# Patient Record
Sex: Female | Born: 1970 | Race: White | Hispanic: No | Marital: Married | Smoking: Never smoker
Health system: Southern US, Community
[De-identification: ages and names within clinical notes are randomized; demographics above are authoritative.]

## PROBLEM LIST (undated history)

## (undated) DIAGNOSIS — I639 Cerebral infarction, unspecified: Secondary | ICD-10-CM

## (undated) DIAGNOSIS — G5600 Carpal tunnel syndrome, unspecified upper limb: Secondary | ICD-10-CM

## (undated) DIAGNOSIS — J45909 Unspecified asthma, uncomplicated: Secondary | ICD-10-CM

## (undated) DIAGNOSIS — K219 Gastro-esophageal reflux disease without esophagitis: Secondary | ICD-10-CM

## (undated) DIAGNOSIS — M1990 Unspecified osteoarthritis, unspecified site: Secondary | ICD-10-CM

## (undated) DIAGNOSIS — Z87442 Personal history of urinary calculi: Secondary | ICD-10-CM

## (undated) DIAGNOSIS — M797 Fibromyalgia: Secondary | ICD-10-CM

## (undated) DIAGNOSIS — F419 Anxiety disorder, unspecified: Secondary | ICD-10-CM

## (undated) DIAGNOSIS — M549 Dorsalgia, unspecified: Secondary | ICD-10-CM

## (undated) DIAGNOSIS — H409 Unspecified glaucoma: Secondary | ICD-10-CM

## (undated) DIAGNOSIS — C50919 Malignant neoplasm of unspecified site of unspecified female breast: Secondary | ICD-10-CM

## (undated) DIAGNOSIS — F32A Depression, unspecified: Secondary | ICD-10-CM

## (undated) DIAGNOSIS — I10 Essential (primary) hypertension: Secondary | ICD-10-CM

## (undated) DIAGNOSIS — E119 Type 2 diabetes mellitus without complications: Secondary | ICD-10-CM

## (undated) DIAGNOSIS — K5090 Crohn's disease, unspecified, without complications: Secondary | ICD-10-CM

## (undated) DIAGNOSIS — R519 Headache, unspecified: Secondary | ICD-10-CM

## (undated) HISTORY — PX: LYMPH NODE BIOPSY: SHX201

## (undated) HISTORY — PX: SMALL INTESTINE SURGERY: SHX150

## (undated) HISTORY — PX: FOOT SURGERY: SHX648

## (undated) HISTORY — PX: BREAST EXCISIONAL BIOPSY: SUR124

## (undated) HISTORY — PX: SHOULDER SURGERY: SHX246

## (undated) HISTORY — PX: BREAST SURGERY: SHX581

---

## 1999-05-15 DIAGNOSIS — C50919 Malignant neoplasm of unspecified site of unspecified female breast: Secondary | ICD-10-CM

## 1999-05-15 HISTORY — DX: Malignant neoplasm of unspecified site of unspecified female breast: C50.919

## 2003-11-16 ENCOUNTER — Other Ambulatory Visit: Payer: Self-pay

## 2003-11-29 ENCOUNTER — Other Ambulatory Visit: Payer: Self-pay

## 2004-02-22 ENCOUNTER — Emergency Department: Payer: Self-pay | Admitting: General Practice

## 2004-07-19 ENCOUNTER — Ambulatory Visit: Payer: Self-pay

## 2004-10-05 ENCOUNTER — Emergency Department: Payer: Self-pay | Admitting: Emergency Medicine

## 2004-10-28 ENCOUNTER — Emergency Department: Payer: Self-pay | Admitting: General Practice

## 2004-11-03 ENCOUNTER — Emergency Department: Payer: Self-pay | Admitting: Emergency Medicine

## 2004-11-27 ENCOUNTER — Emergency Department: Payer: Self-pay | Admitting: General Practice

## 2004-12-04 ENCOUNTER — Emergency Department: Payer: Self-pay | Admitting: Emergency Medicine

## 2005-01-10 ENCOUNTER — Emergency Department: Payer: Self-pay | Admitting: Emergency Medicine

## 2005-03-26 ENCOUNTER — Emergency Department: Payer: Self-pay | Admitting: Emergency Medicine

## 2005-03-30 IMAGING — US ULTRASOUND RIGHT BREAST
1 series · 17 of 20 positions shown · non-contrast
Comparison: none

REASON FOR EXAM: swollen area rt arm
COMMENTS:

[Series 1: ultrasound right breast · 17 of 20 slices shown]
[im 1/20]
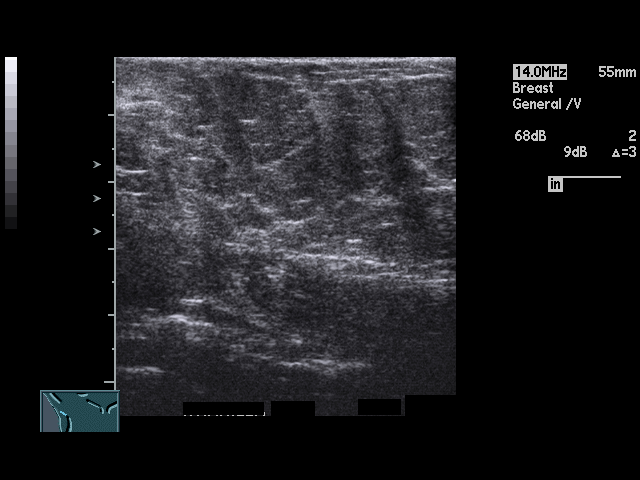
[im 2/20]
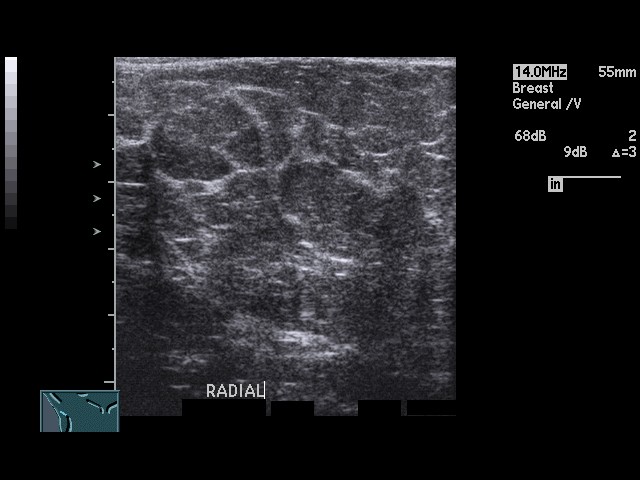
[im 3/20]
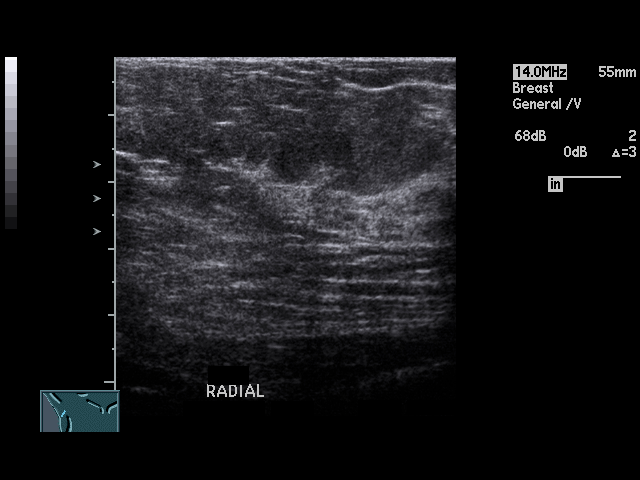
[im 5/20]
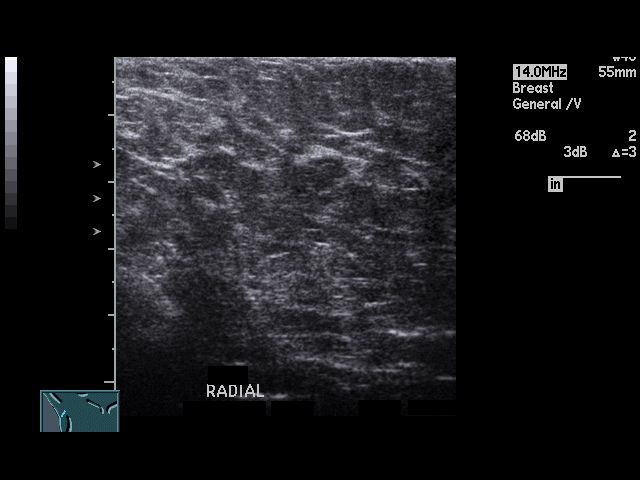
[im 6/20]
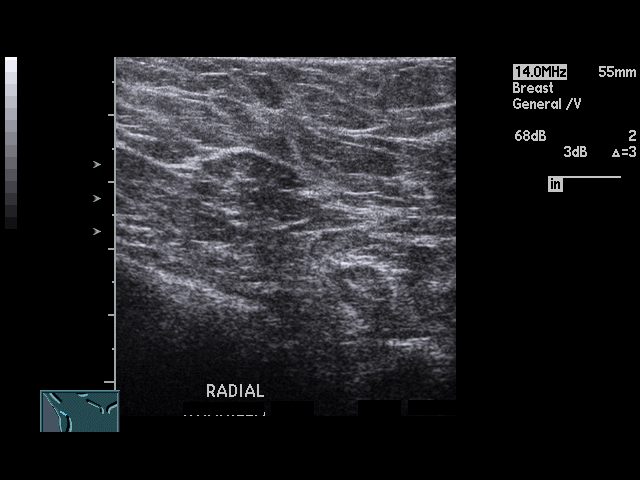
[im 7/20]
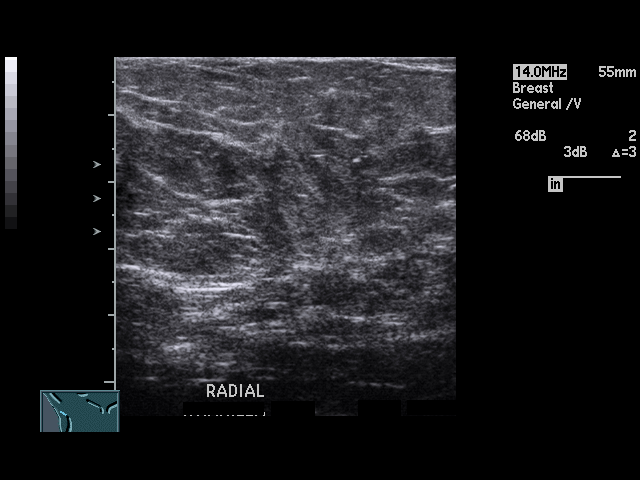
[im 8/20]
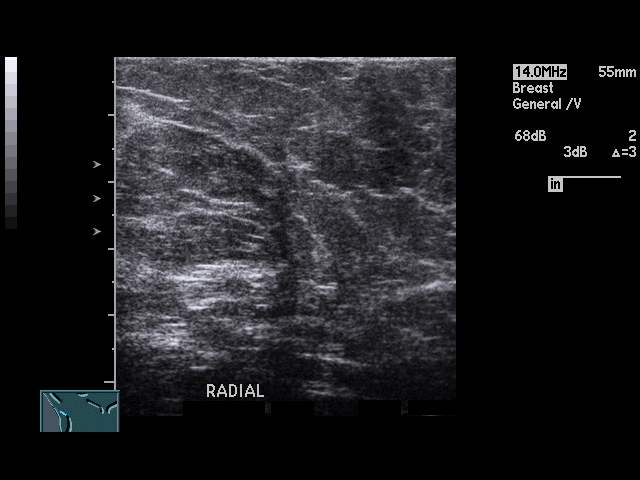
[im 9/20]
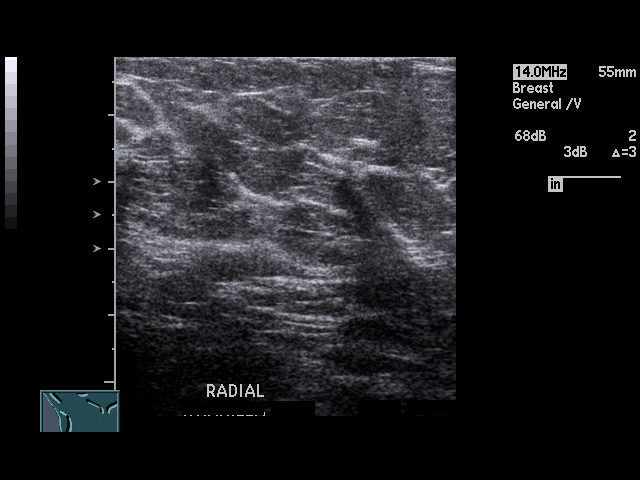
[im 11/20]
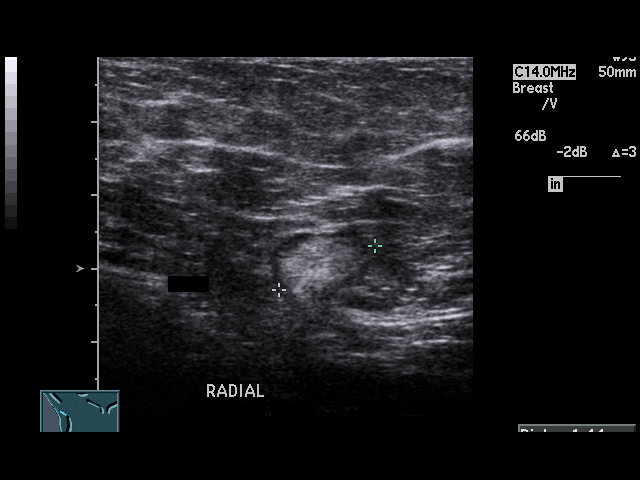
[im 12/20]
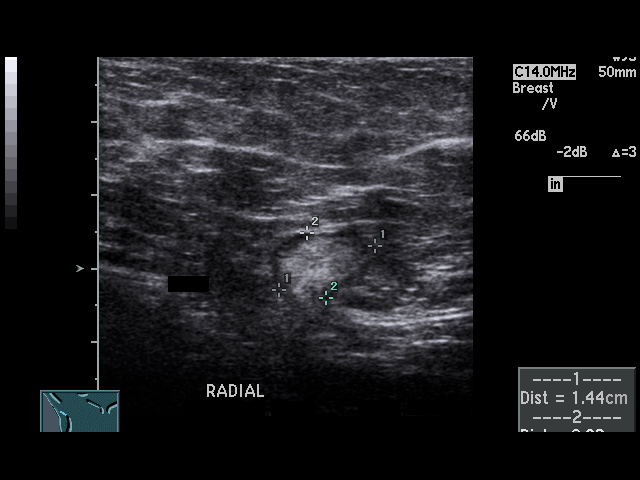
[im 13/20]
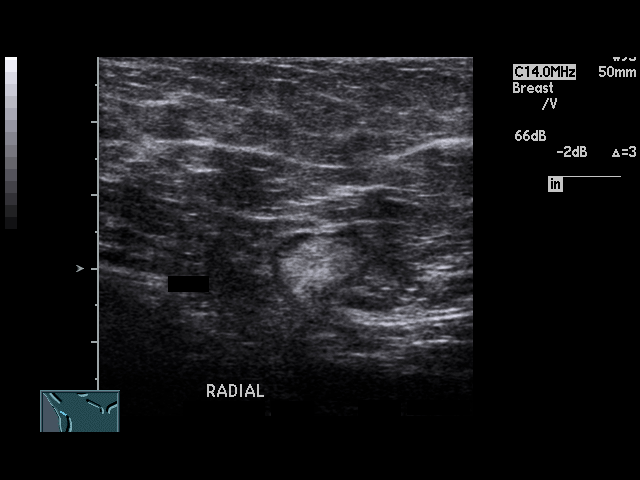
[im 14/20]
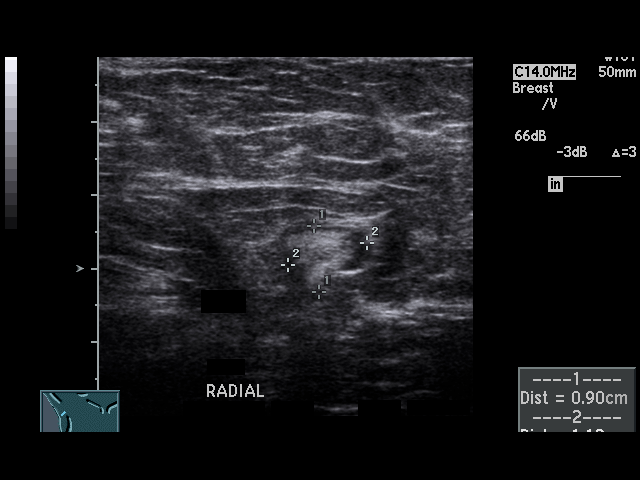
[im 15/20]
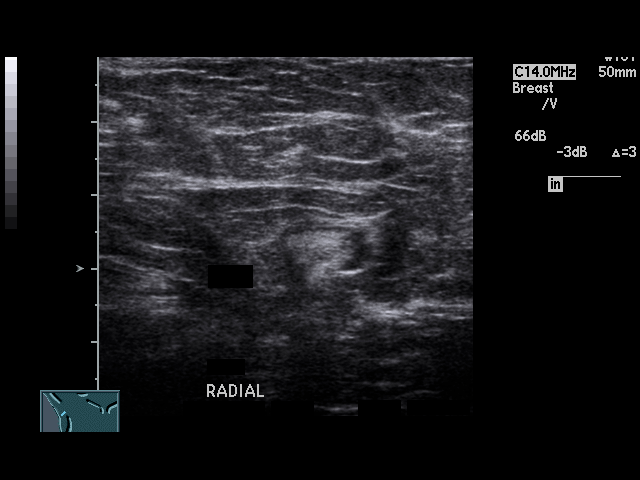
[im 16/20]
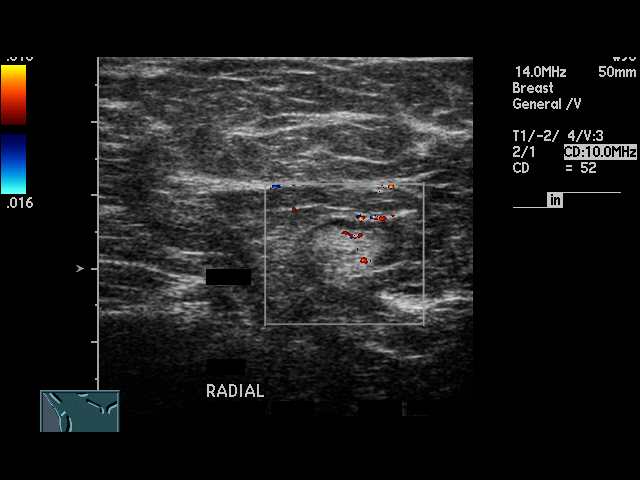
[im 18/20]
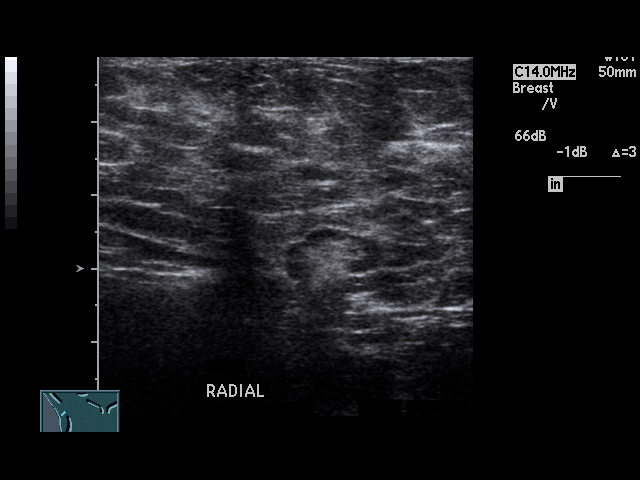
[im 19/20]
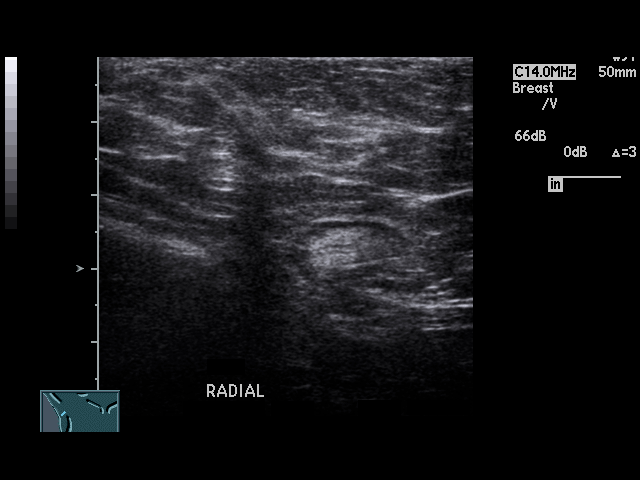
[im 20/20]
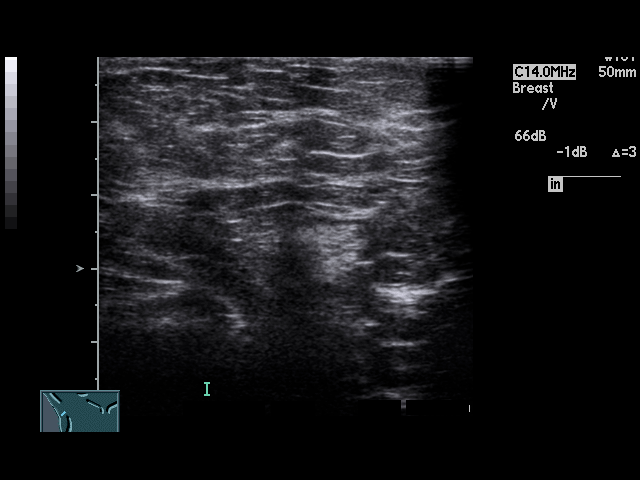

[17 of 20 positions shown; findings below may reference images not displayed]

PROCEDURE:     US  - US BREAST RIGHT  - RIGHT - July 19, 2004  [DATE]

RESULT:        The patient has noted an area of tenderness in the upper
outer quadrant of the RIGHT breast.  Ultrasound is performed over this
region and shows what appears to be a smoothly  marginated area of
peripheral hypoattenuation and a central area of hyperattenuation suggestive
of fat within the hilum of a hypoechoic lymph node.  No definite malignant
characteristics are seen.  No cysts or abscesses are identified.
IMPRESSION: Probable RIGHT upper outer quadrant axillary lymph node.

## 2005-05-04 ENCOUNTER — Emergency Department: Payer: Self-pay | Admitting: Emergency Medicine

## 2005-06-20 ENCOUNTER — Emergency Department: Payer: Self-pay | Admitting: Unknown Physician Specialty

## 2005-07-10 IMAGING — CR PELVIS - 1-2 VIEW
1 series · 1 of 1 positions shown · non-contrast
Comparison: none

REASON FOR EXAM: Fall
COMMENTS:  LMP: > one month ago

[view not recorded]
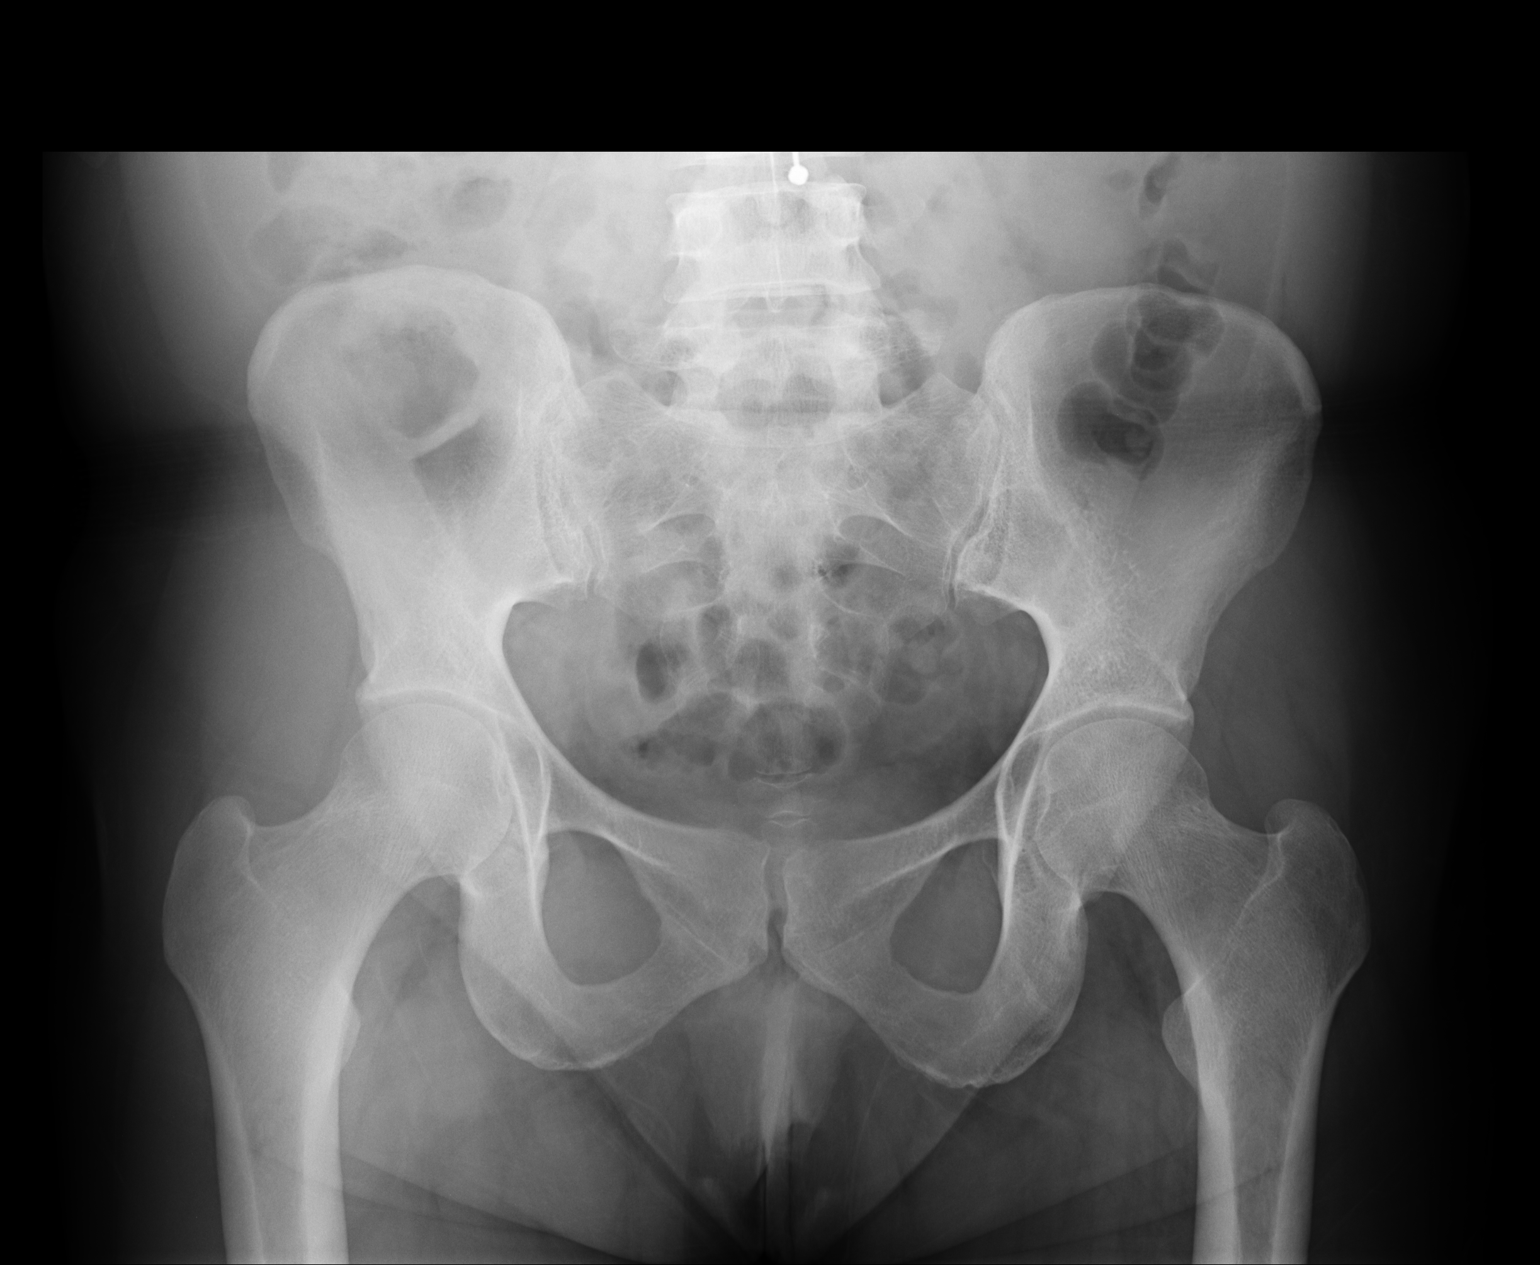

[1 of 1 positions shown; findings below may reference images not displayed]

PROCEDURE:     DXR - DXR PELVIS AP ONLY  - October 29, 2004 [DATE]

RESULT:     Single view of the pelvis is re-submitted given that the
original dictation was apparently lost in the dictation system.

The study shows an umbilical piercing seen in the midline at the L3-4 level.
The bowel gas pattern appears to be grossly normal. The overlying soft
tissues are unremarkable. The bony structures appear to be intact.
IMPRESSION: No evidence of pelvic fracture seen.
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
## 2005-07-17 ENCOUNTER — Emergency Department: Payer: Self-pay | Admitting: Emergency Medicine

## 2005-08-08 IMAGING — CR DG HAND COMPLETE 3+V*L*
1 series · 3 of 3 positions shown · non-contrast
Comparison: none

REASON FOR EXAM: Fall
COMMENTS:

PROCEDURE:     DXR - DXR HAND LT COMPLETE  W/OBLIQUES  - November 27, 2004  [DATE]
RESULT:     Three views of the LEFT hand reveal no acute fractures or
dislocations. The joint spaces are intact.

[Series 1: view not recorded · 0.17mm/px · 3 of 3 slices shown]
[im 1/3]
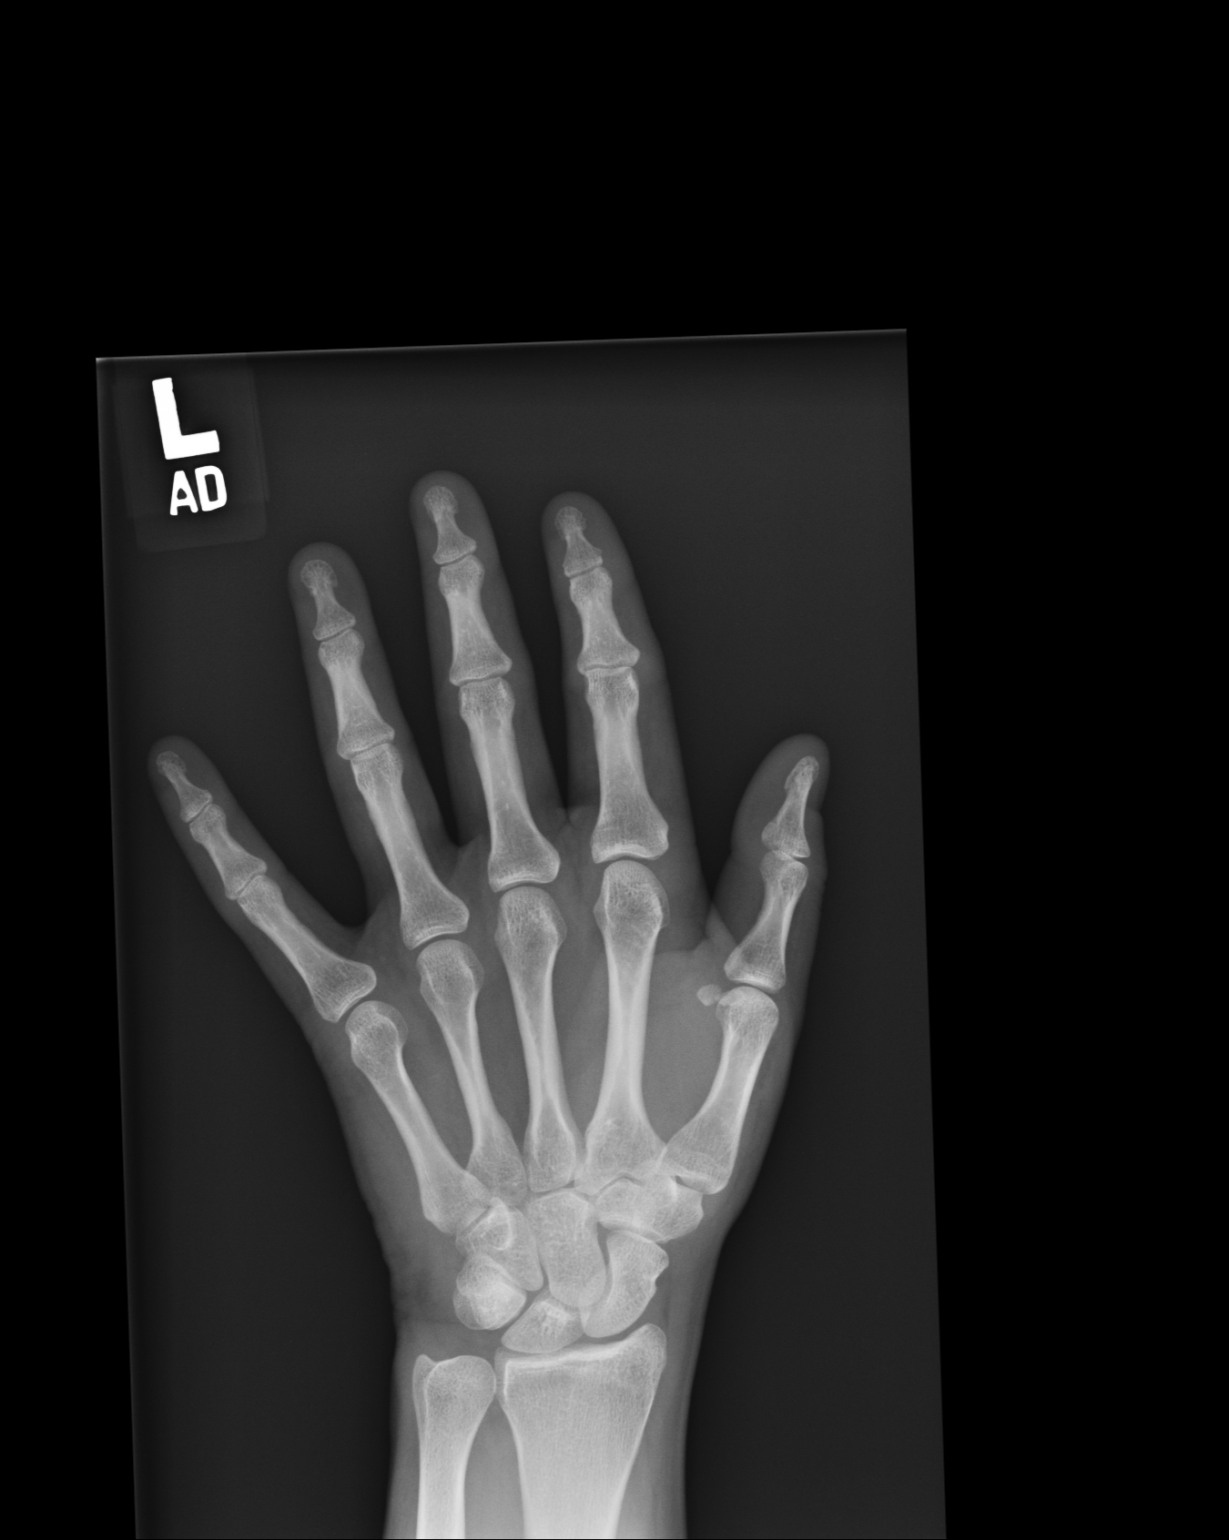
[im 2/3]
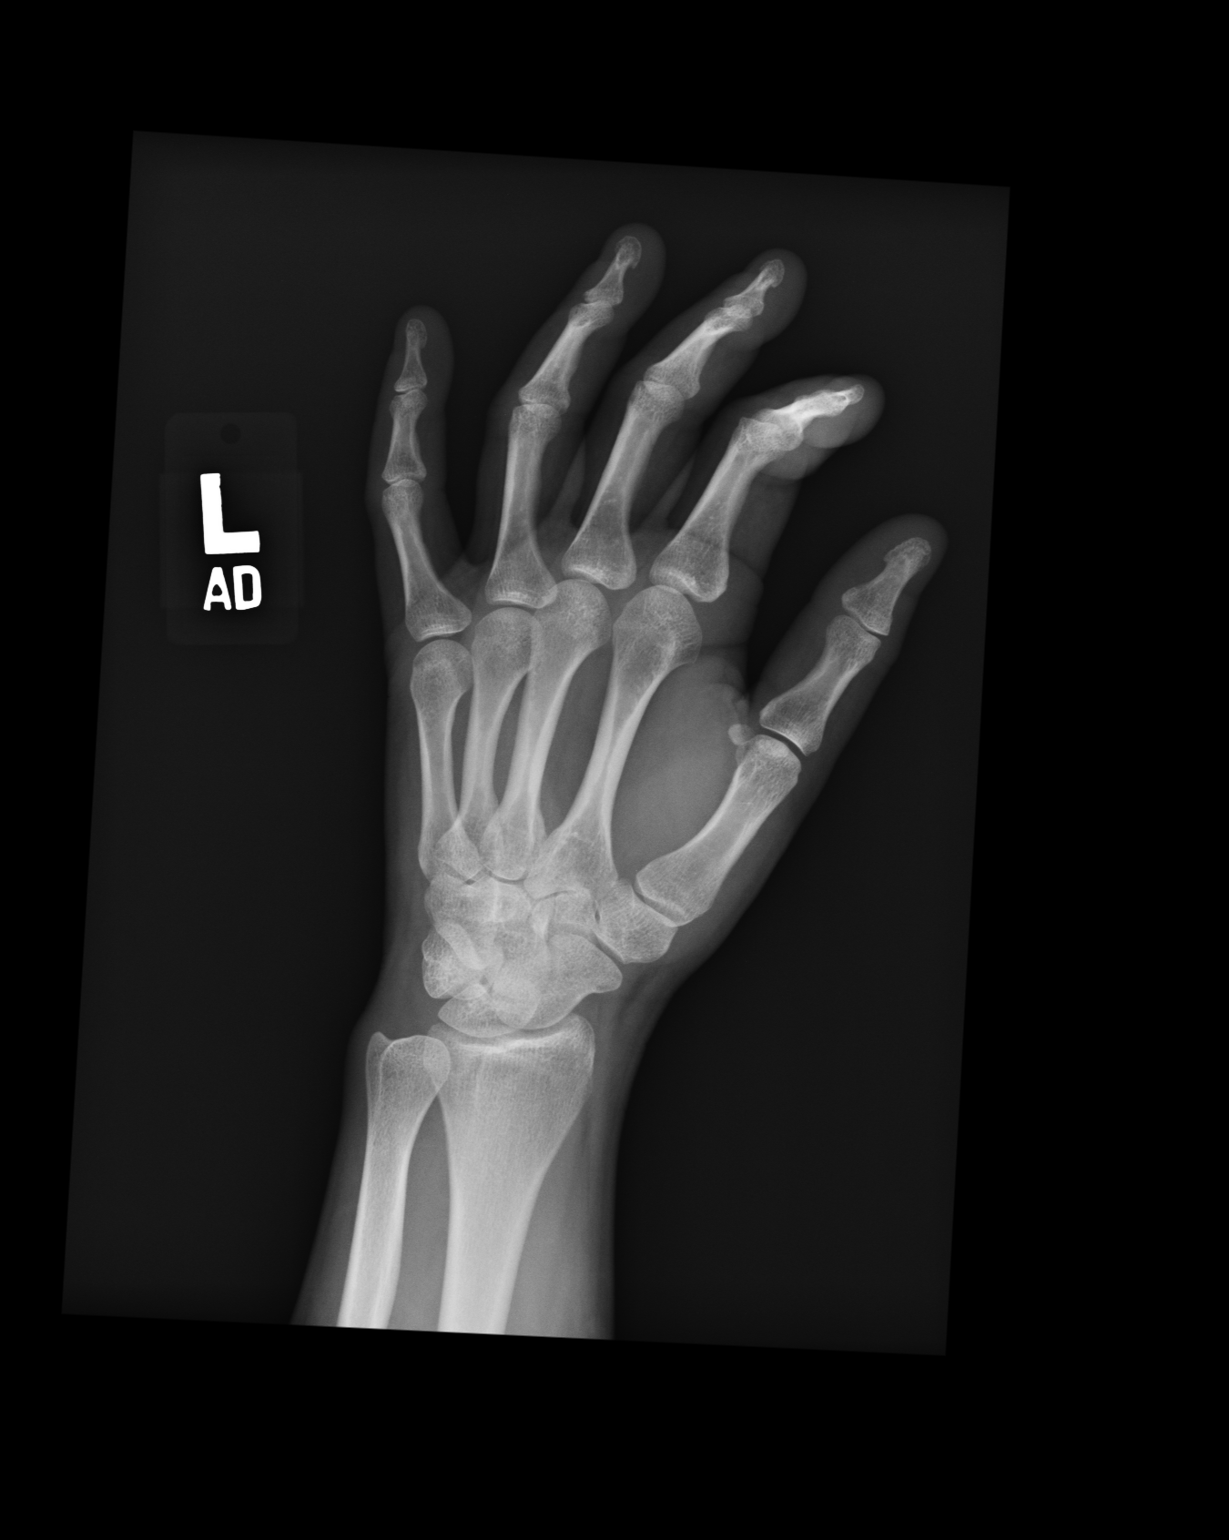
[im 3/3]
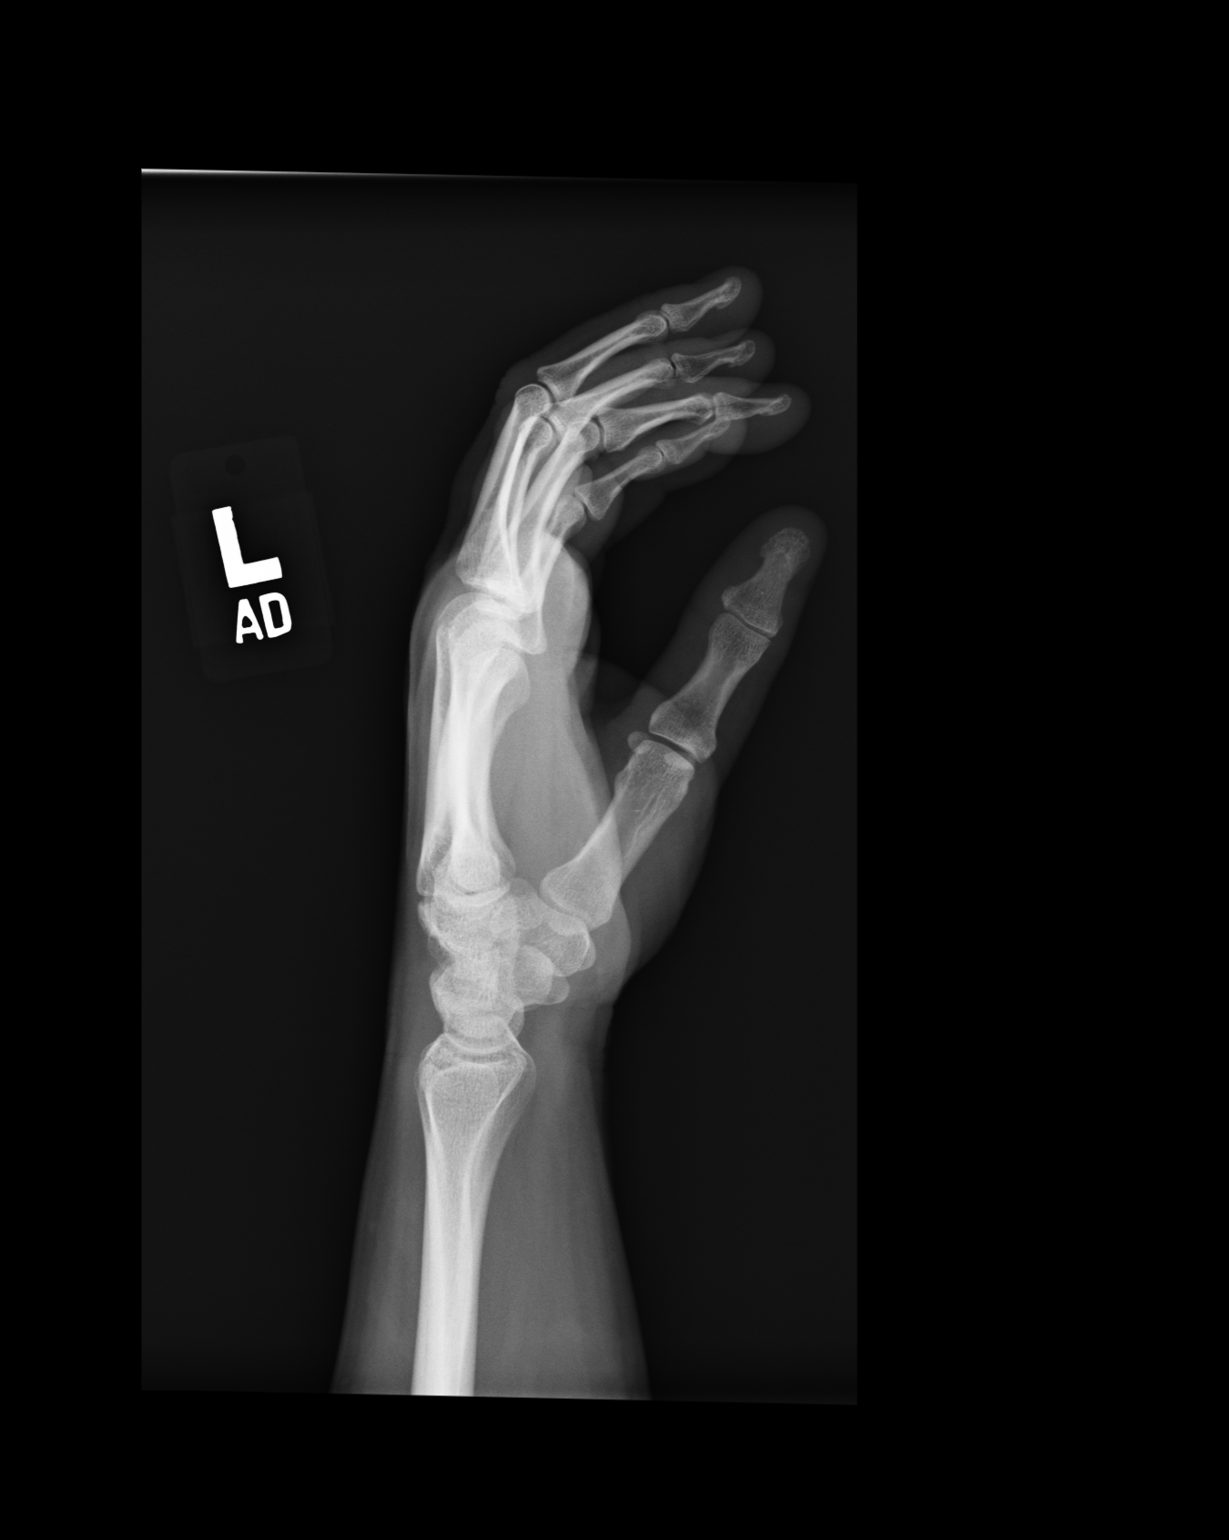

[3 of 3 positions shown; findings below may reference images not displayed]

IMPRESSION: No acute fracture is noted of the LEFT hand.

## 2005-08-08 IMAGING — CR DG SHOULDER 3+V*L*
1 series · 3 of 3 positions shown · non-contrast
Comparison: none

REASON FOR EXAM: fall
COMMENTS:

PROCEDURE:     DXR - DXR SHOULDER LEFT COMPLETE  - November 27, 2004  [DATE]
RESULT:         Three views reveals no fractures or dislocations. The joint
space is intact.

[Series 1: view not recorded · 0.17mm/px · 3 of 3 slices shown]
[im 1/3]
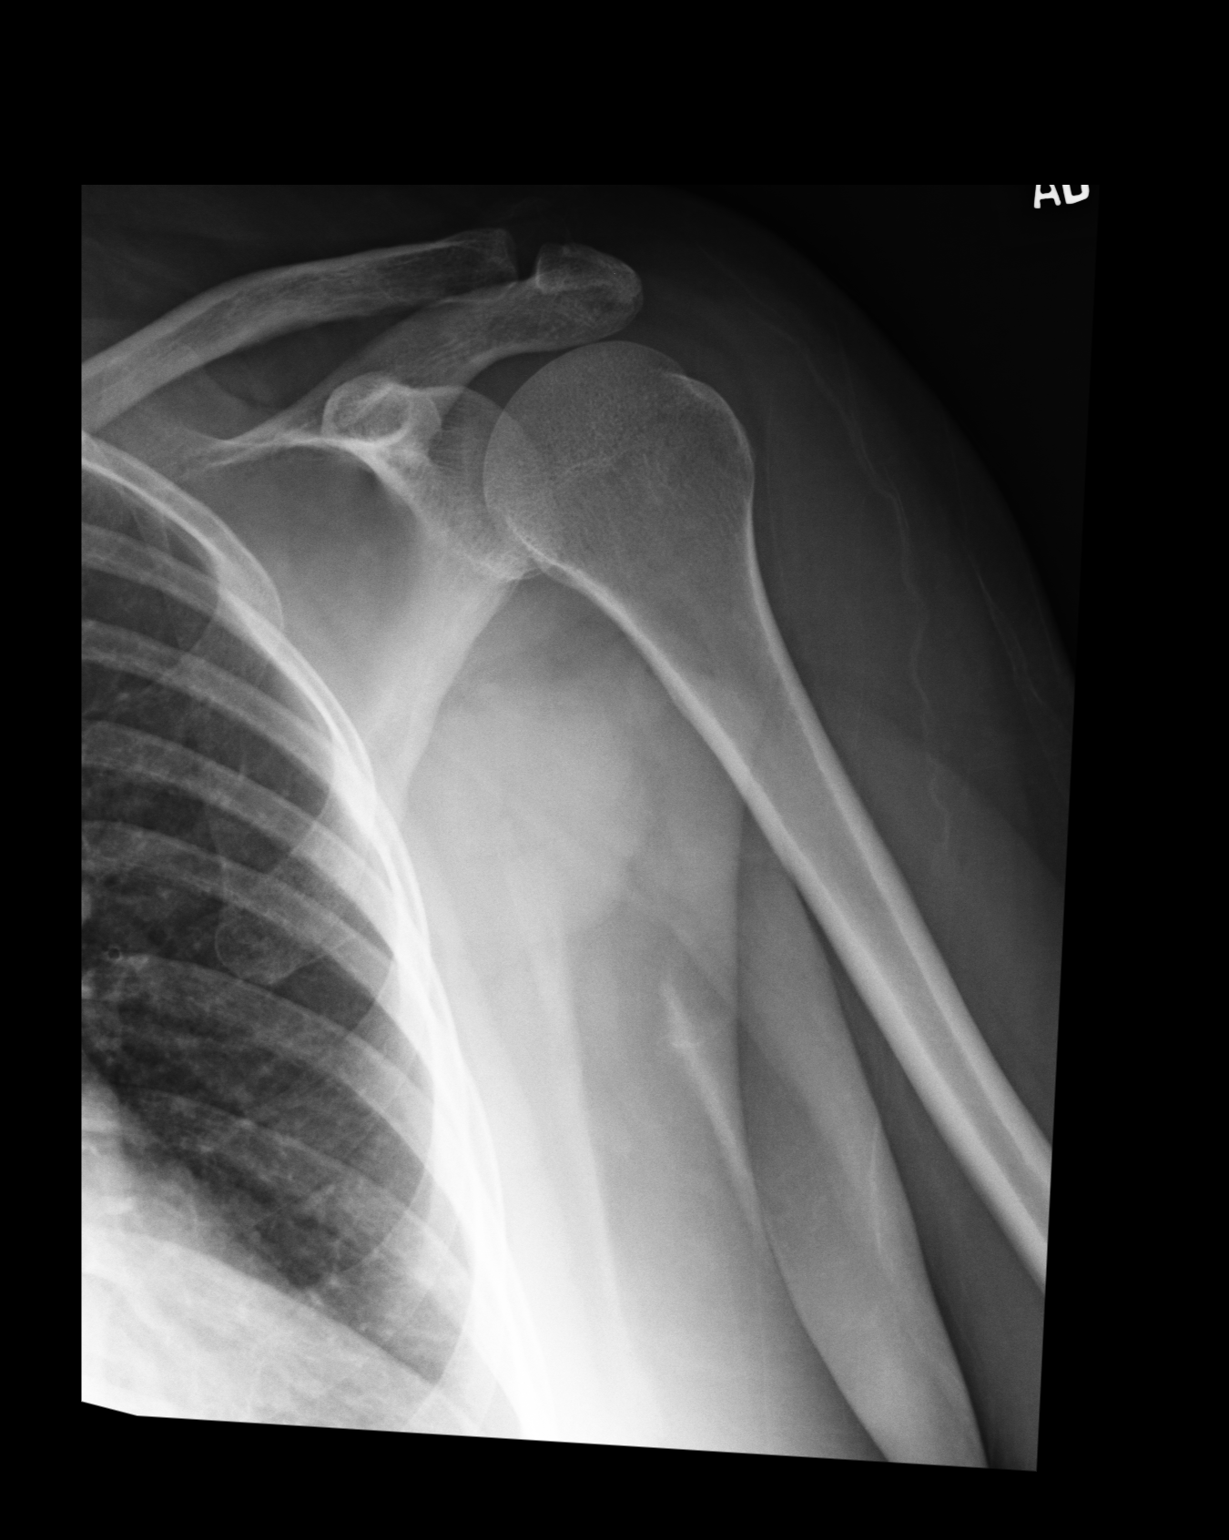
[im 2/3]
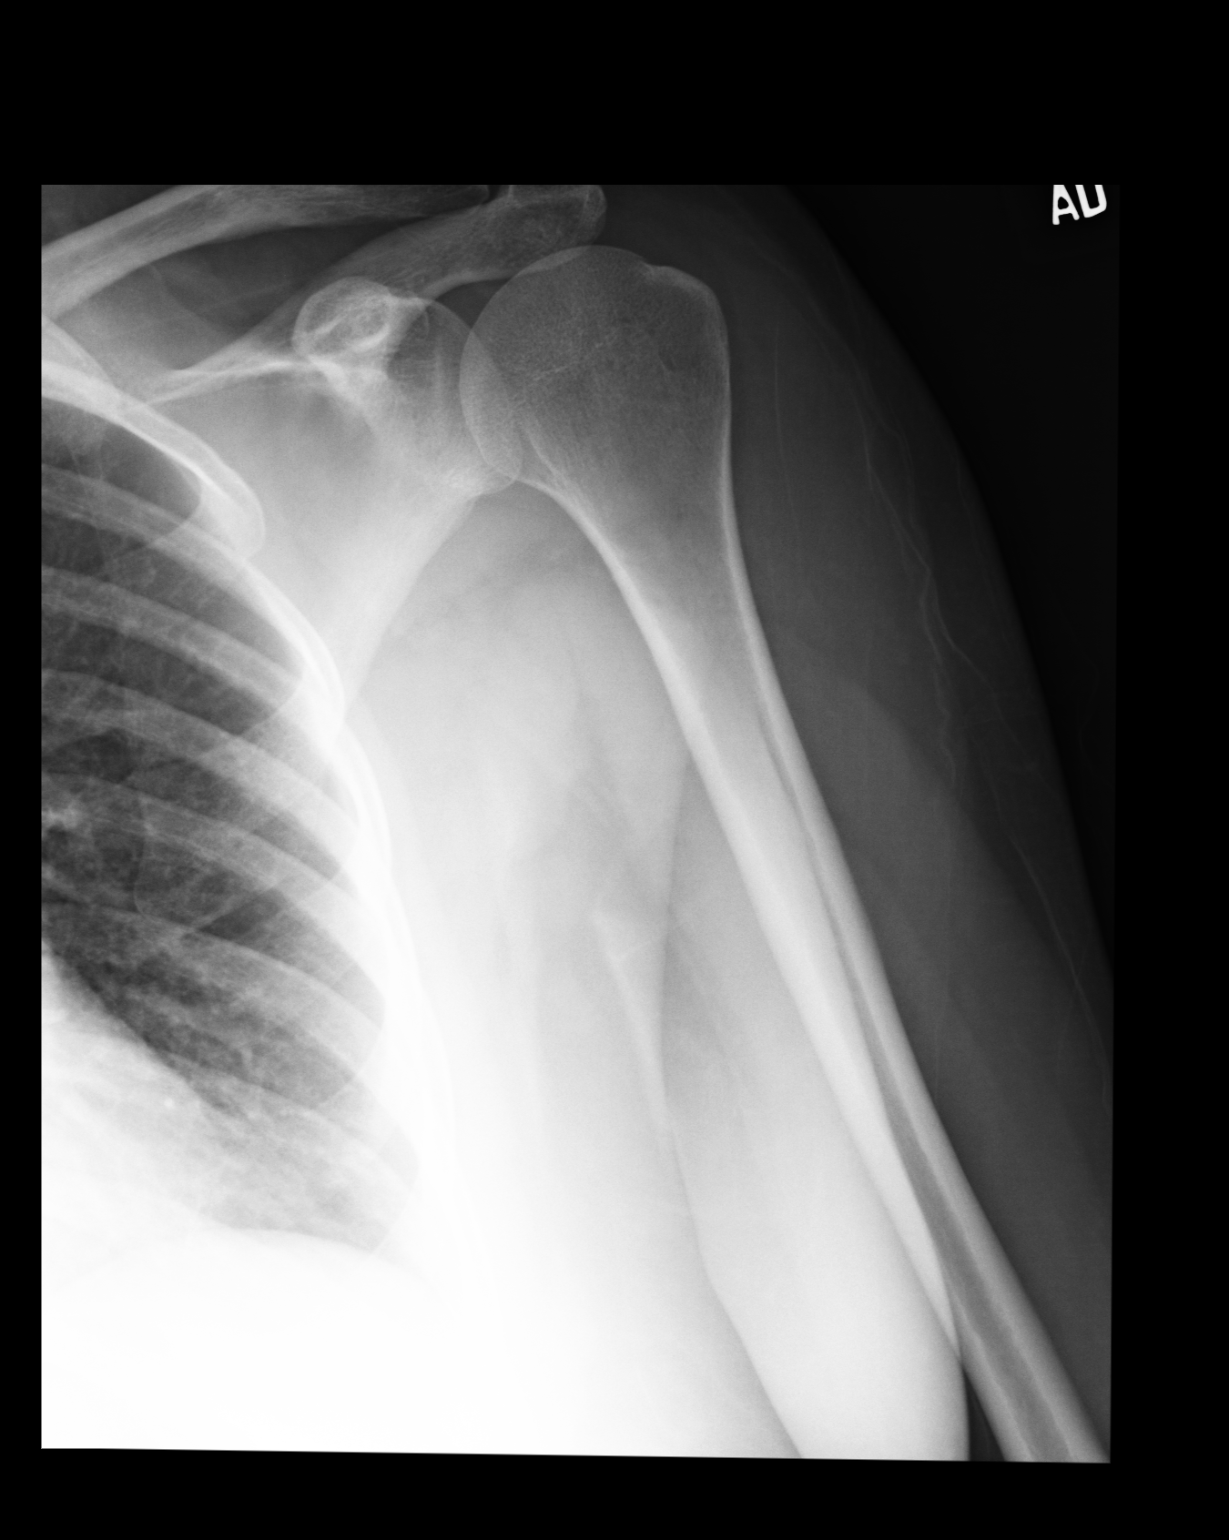
[im 3/3]
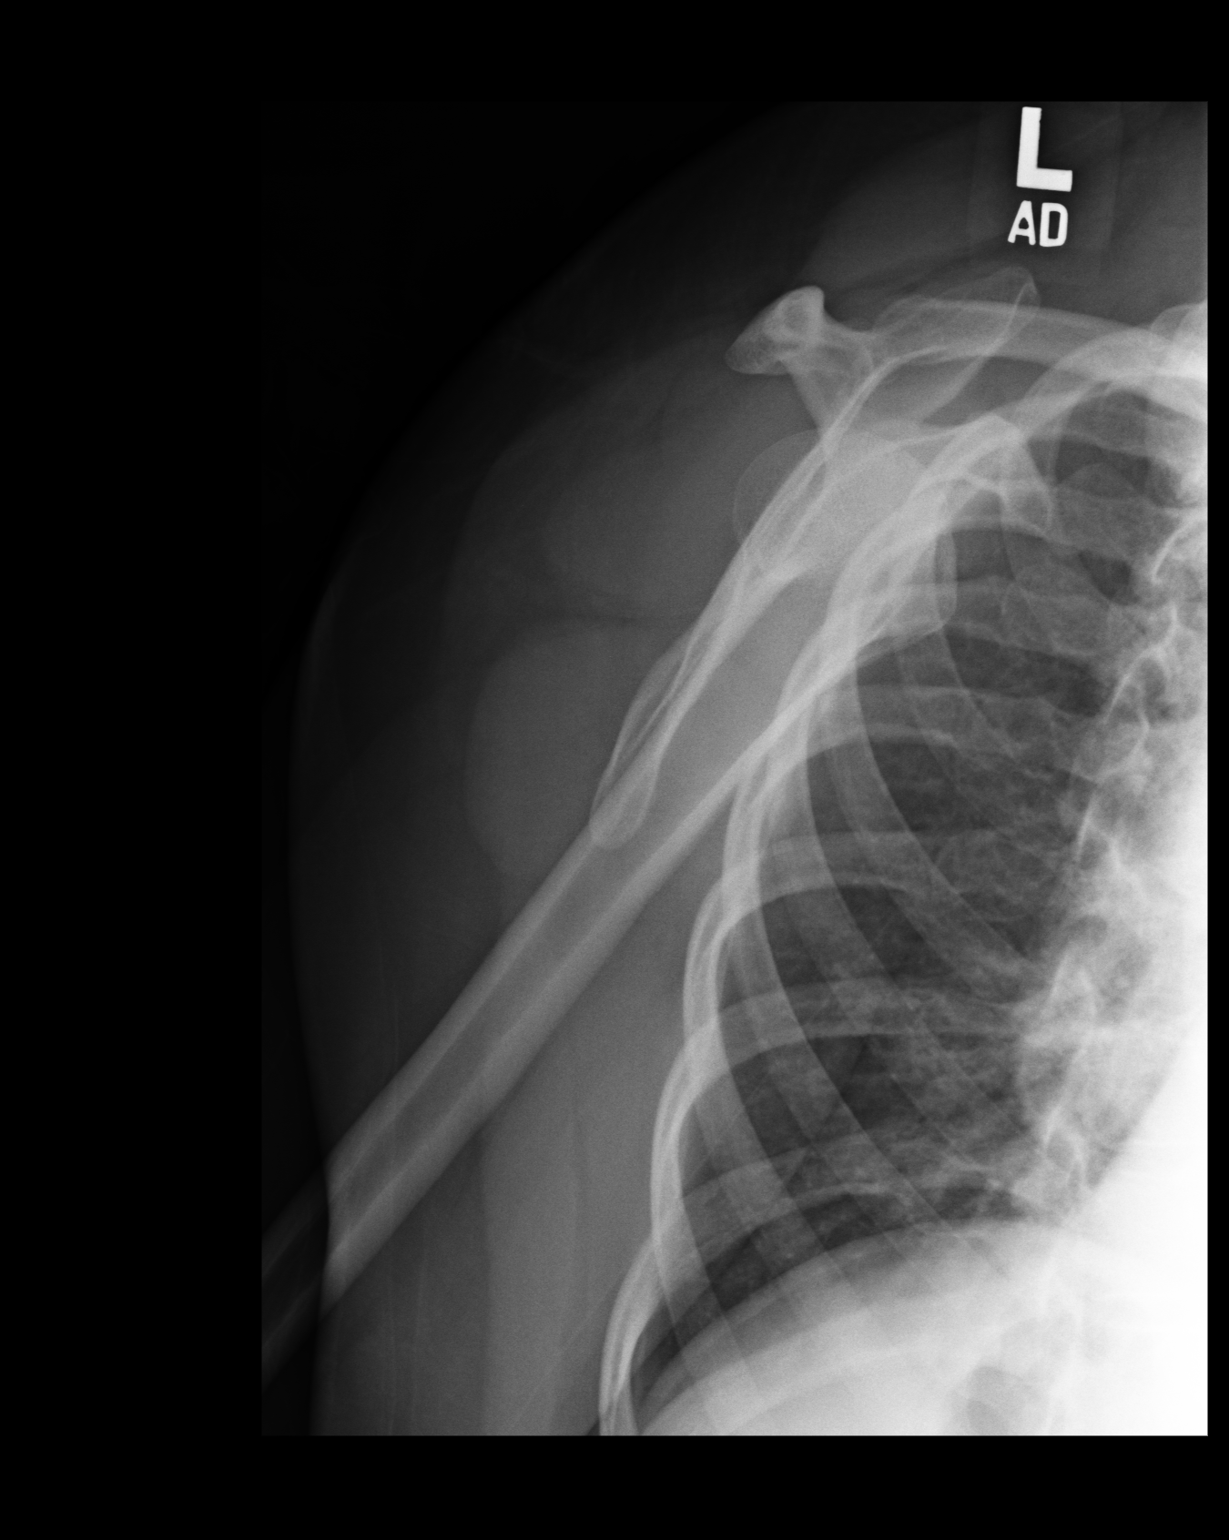

[3 of 3 positions shown; findings below may reference images not displayed]

IMPRESSION: No acute fracture seen of the LEFT shoulder.

## 2005-08-13 ENCOUNTER — Ambulatory Visit: Payer: Self-pay | Admitting: Family Medicine

## 2005-09-18 ENCOUNTER — Emergency Department: Payer: Self-pay | Admitting: Emergency Medicine

## 2005-10-23 ENCOUNTER — Emergency Department: Payer: Self-pay | Admitting: Emergency Medicine

## 2006-03-28 IMAGING — CR DG CHEST 2V
1 series · 2 of 2 positions shown · non-contrast
Comparison: none

REASON FOR EXAM: Cough
COMMENTS:  LMP: Post-Menopausal

PROCEDURE:     DXR - DXR CHEST PA (OR AP) AND LATERAL  - July 17, 2005  [DATE]
RESULT:     The lung fields are clear. No pneumonia, pneumothorax or pleural
effusion is seen. The heart size is normal.

[Series 4212: postero_anterior · 0.22mm/px · 2 of 2 slices shown]
[im 1/2]
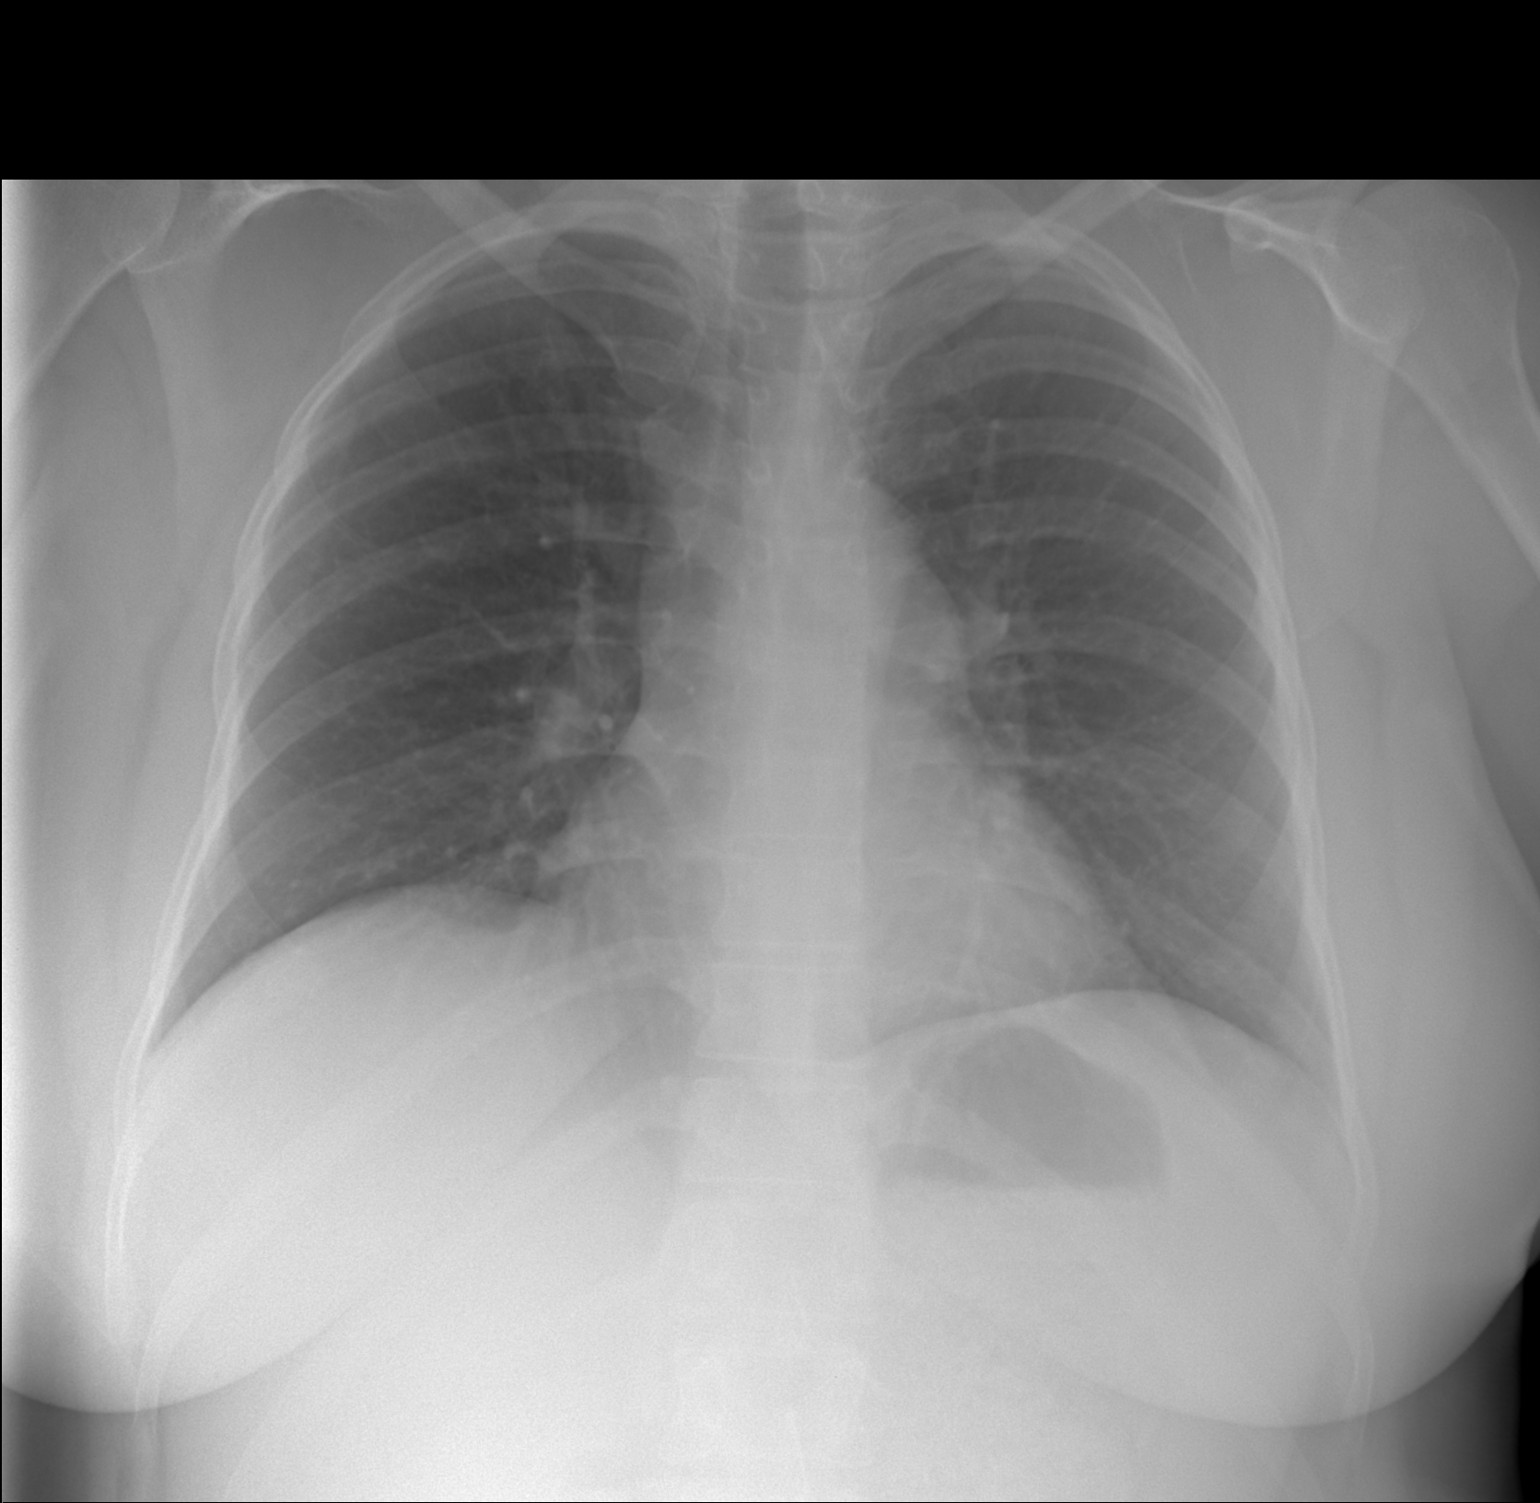
[im 2/2]
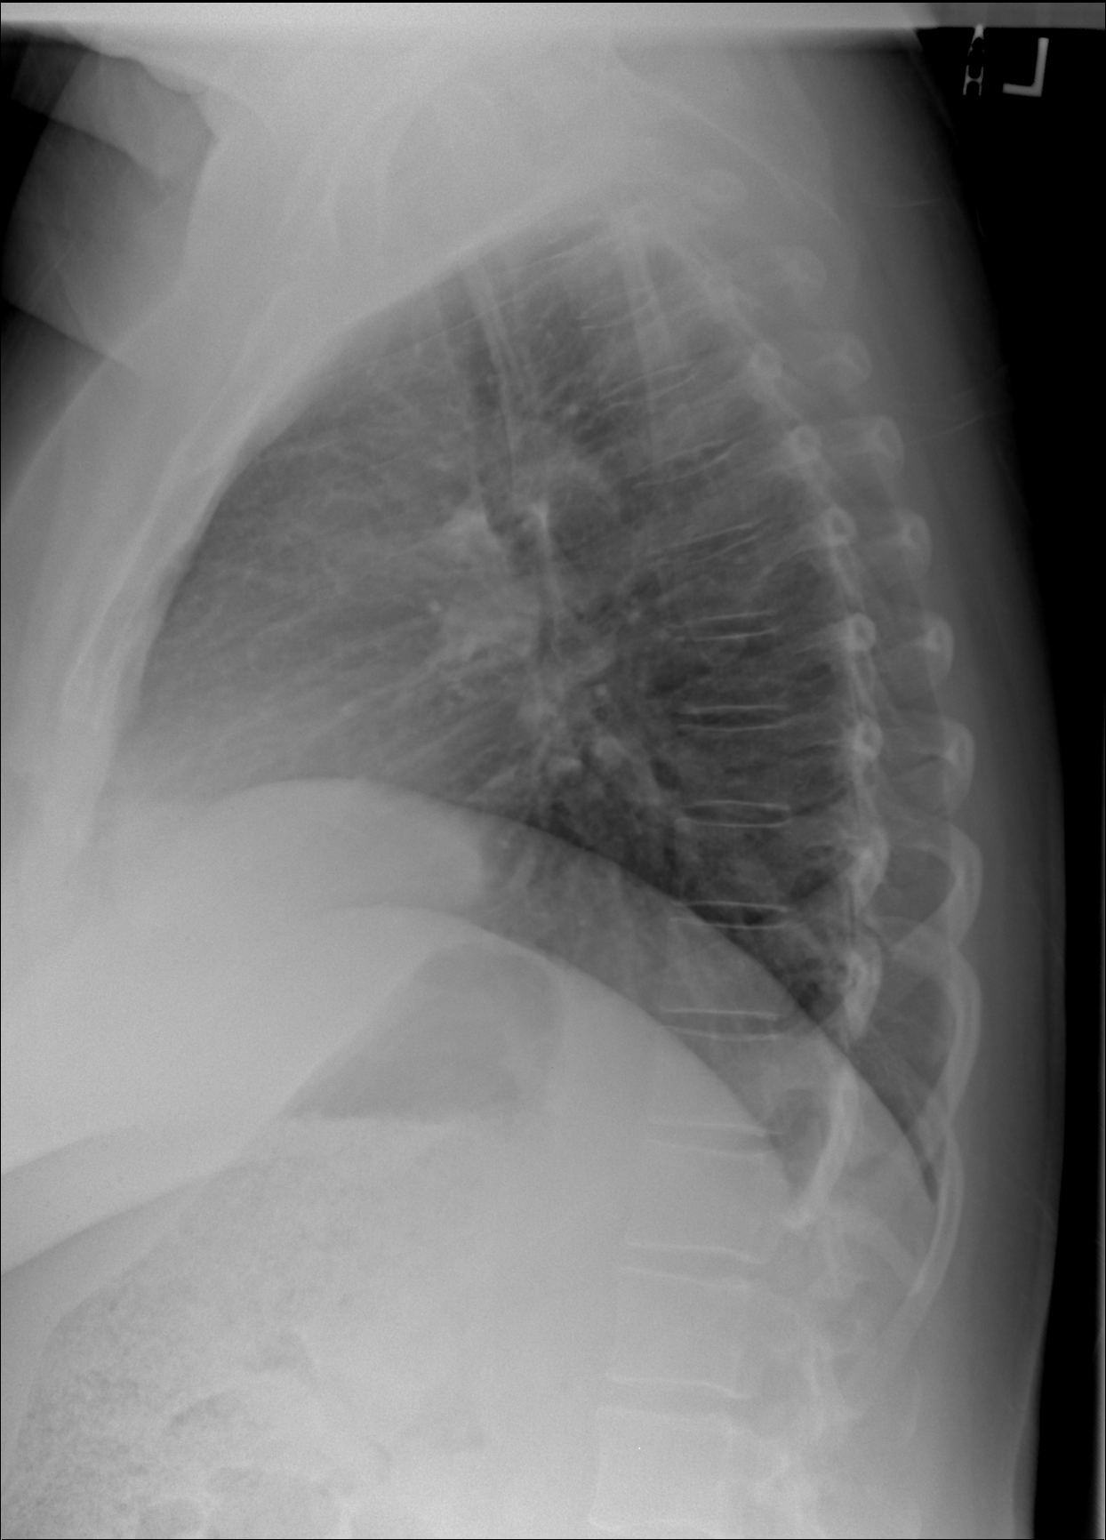

[2 of 2 positions shown; findings below may reference images not displayed]

IMPRESSION: No significant abnormalities are noted.

## 2006-07-04 IMAGING — CR RIGHT ELBOW - COMPLETE 3+ VIEW
1 series · 4 of 4 positions shown · non-contrast
Comparison: none

REASON FOR EXAM: fall
COMMENTS:

PROCEDURE:     DXR - DXR ELBOW RT COMP W/OBLIQUES  - October 23, 2005 [DATE]
RESULT:          AP, lateral and oblique views reveals no fracture or
dislocation.  The joint space is intact.  No joint effusion is seen.

[Series 1: view not recorded · 0.17mm/px · 4 of 4 slices shown]
[im 1/4]
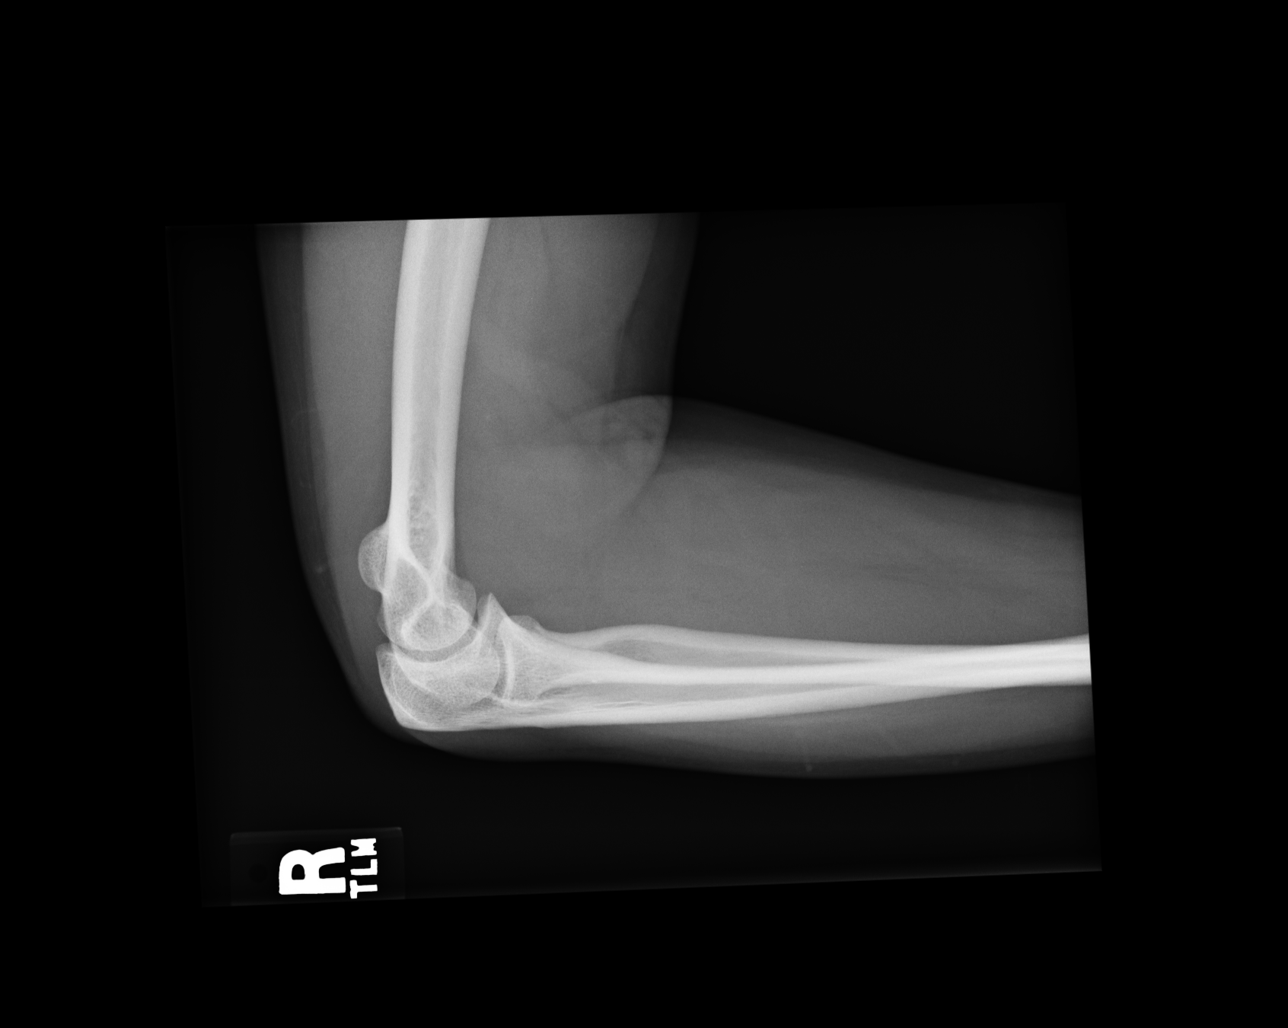
[im 2/4]
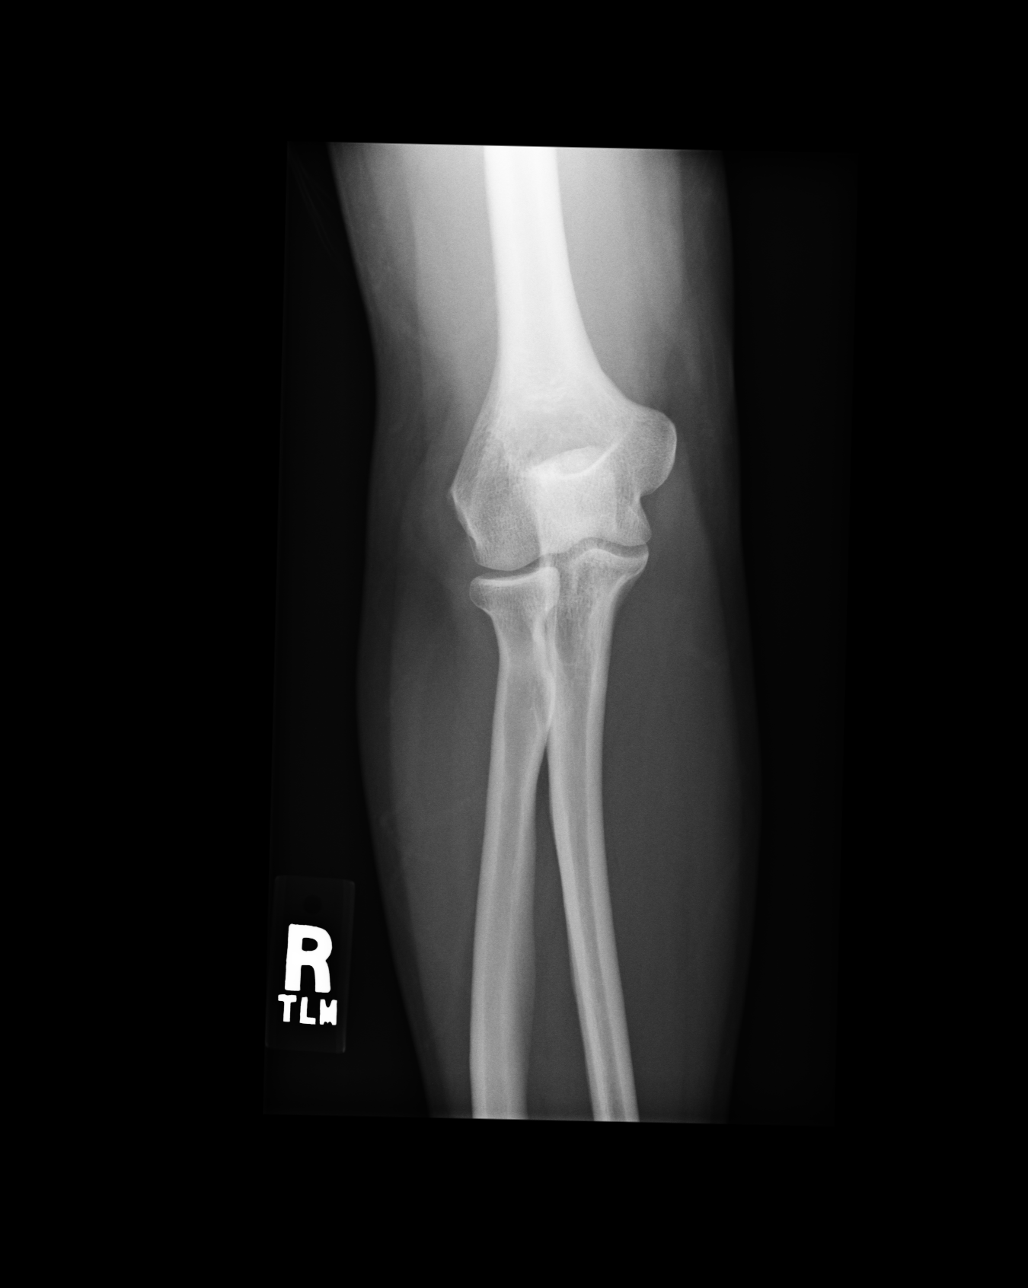
[im 3/4]
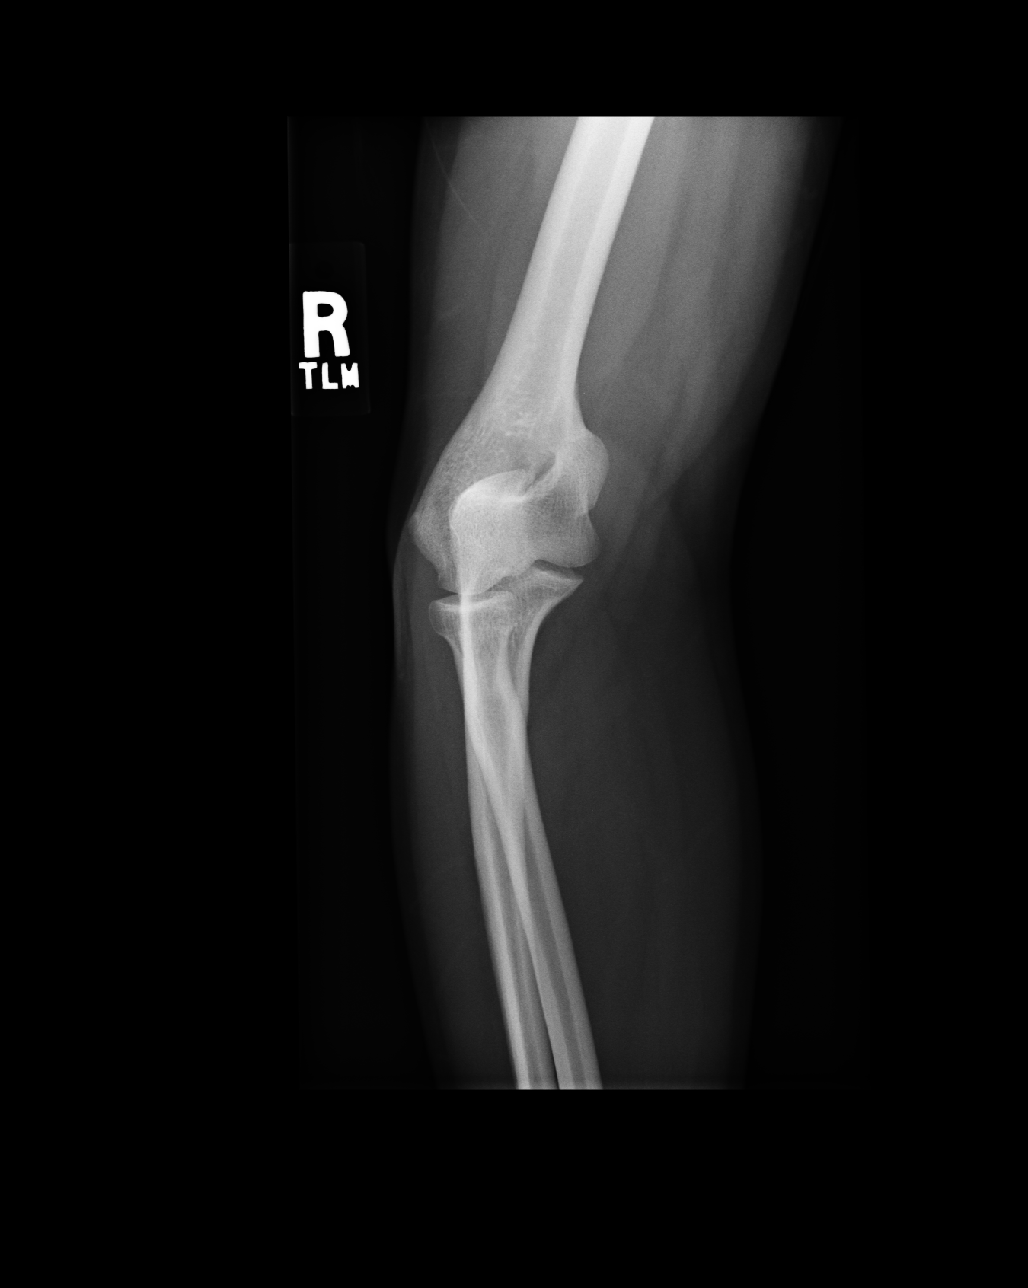
[im 4/4]
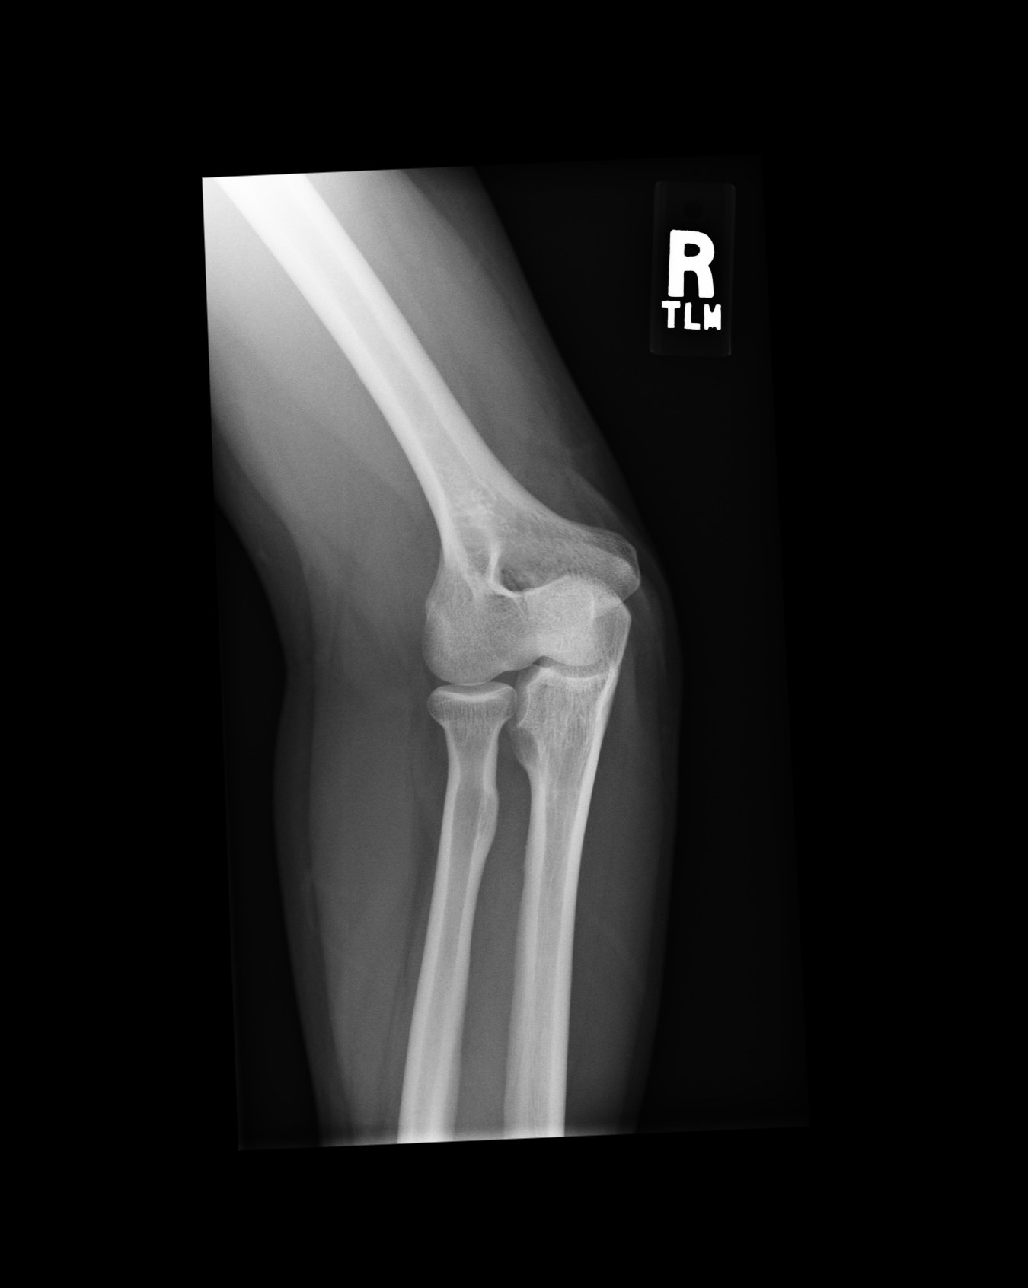

[4 of 4 positions shown; findings below may reference images not displayed]

IMPRESSION: No acute fracture is seen of the RIGHT elbow.

## 2006-07-17 ENCOUNTER — Ambulatory Visit: Payer: Self-pay | Admitting: Family Medicine

## 2006-07-17 ENCOUNTER — Emergency Department: Payer: Self-pay | Admitting: Emergency Medicine

## 2006-07-21 ENCOUNTER — Emergency Department: Payer: Self-pay | Admitting: Emergency Medicine

## 2006-07-22 ENCOUNTER — Ambulatory Visit: Payer: Self-pay | Admitting: Family Medicine

## 2006-08-05 ENCOUNTER — Ambulatory Visit: Payer: Self-pay | Admitting: Surgery

## 2006-08-05 ENCOUNTER — Other Ambulatory Visit: Payer: Self-pay

## 2006-08-09 ENCOUNTER — Ambulatory Visit: Payer: Self-pay | Admitting: Surgery

## 2006-09-03 ENCOUNTER — Emergency Department: Payer: Self-pay | Admitting: Emergency Medicine

## 2006-09-03 IMAGING — CR CERVICAL SPINE - 2-3 VIEW
1 series · 5 of 5 positions shown · non-contrast
Comparison: none

REASON FOR EXAM: Fall
COMMENTS:  LMP: > one month ago

PROCEDURE:     DXR - DXR C- SPINE AP AND LATERAL  - October 29, 2004 [DATE]
RESULT:
CLINICAL:  Fall.

[Series 1: view not recorded · 0.17mm/px · 5 of 5 slices shown]
[im 1/5]
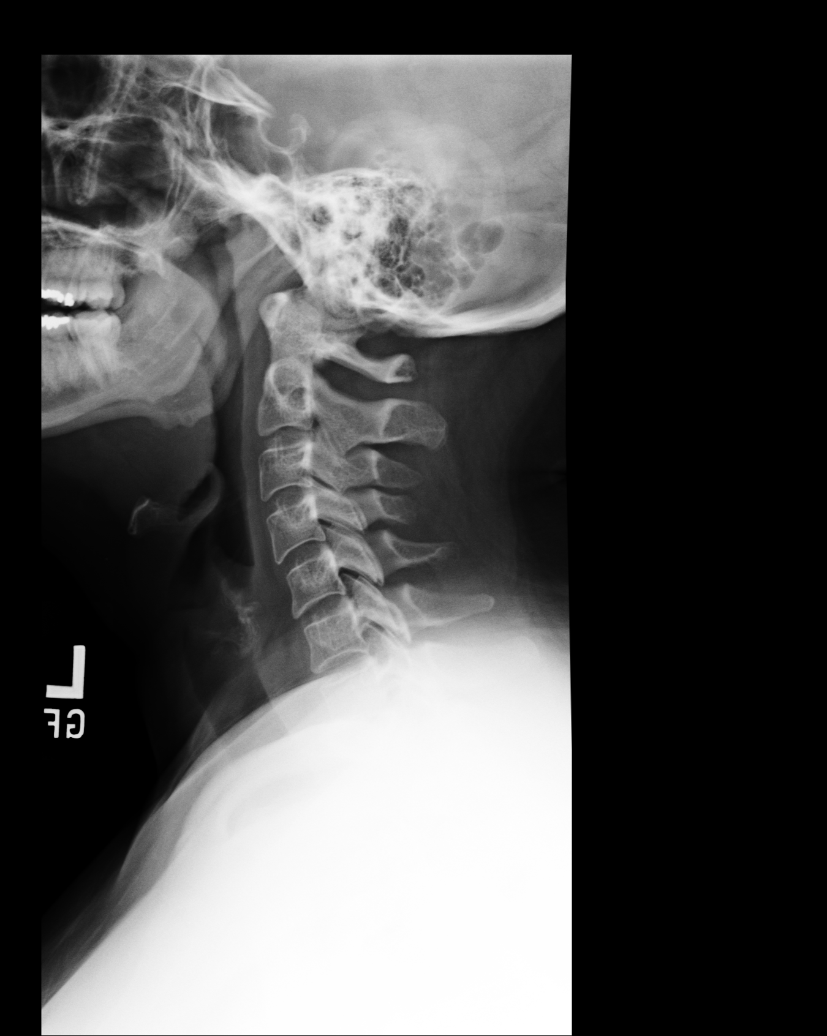
[im 2/5]
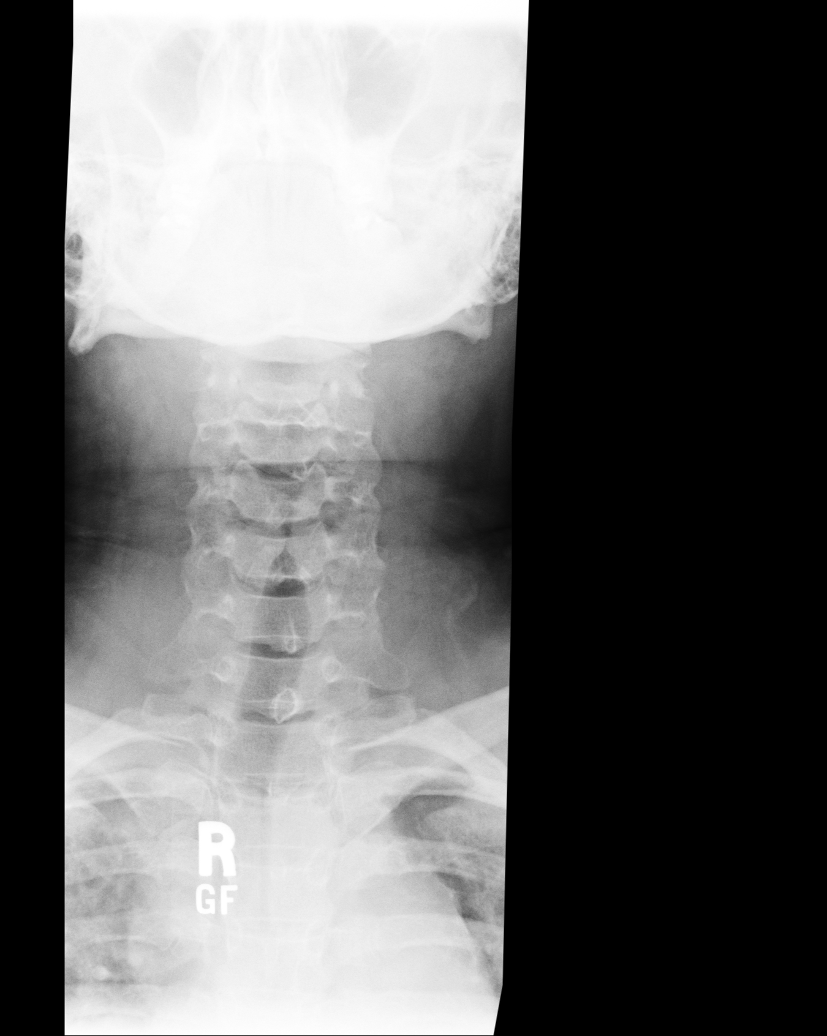
[im 3/5]
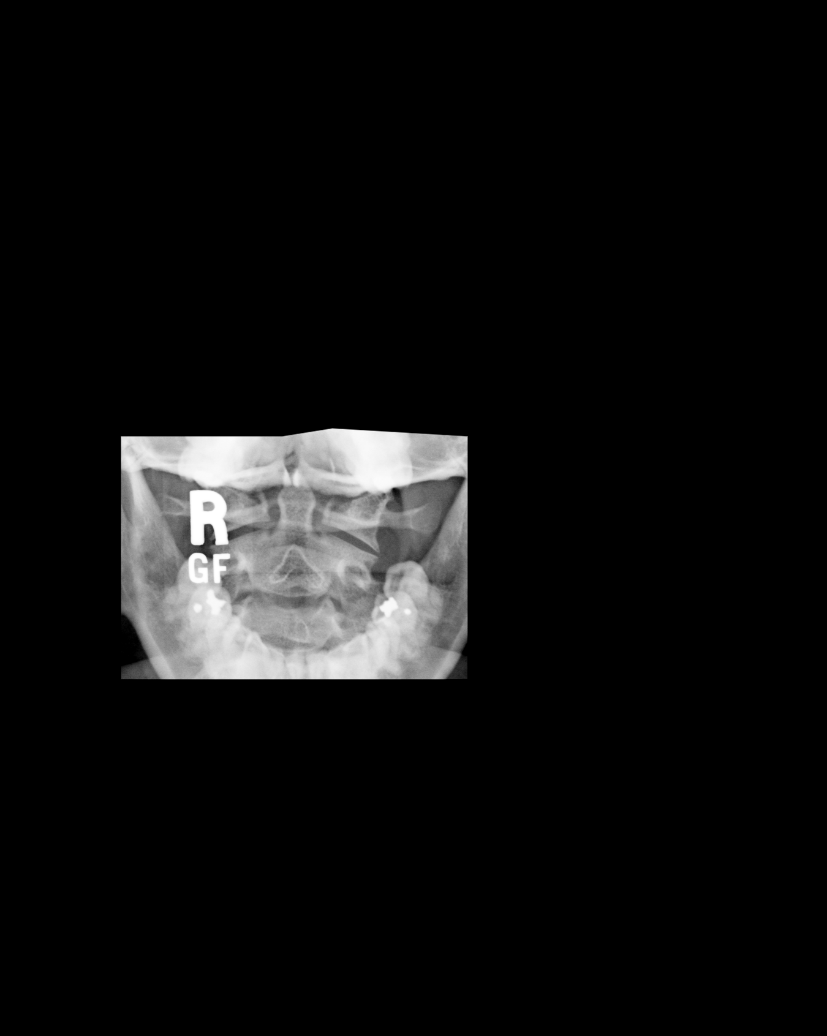
[im 4/5]
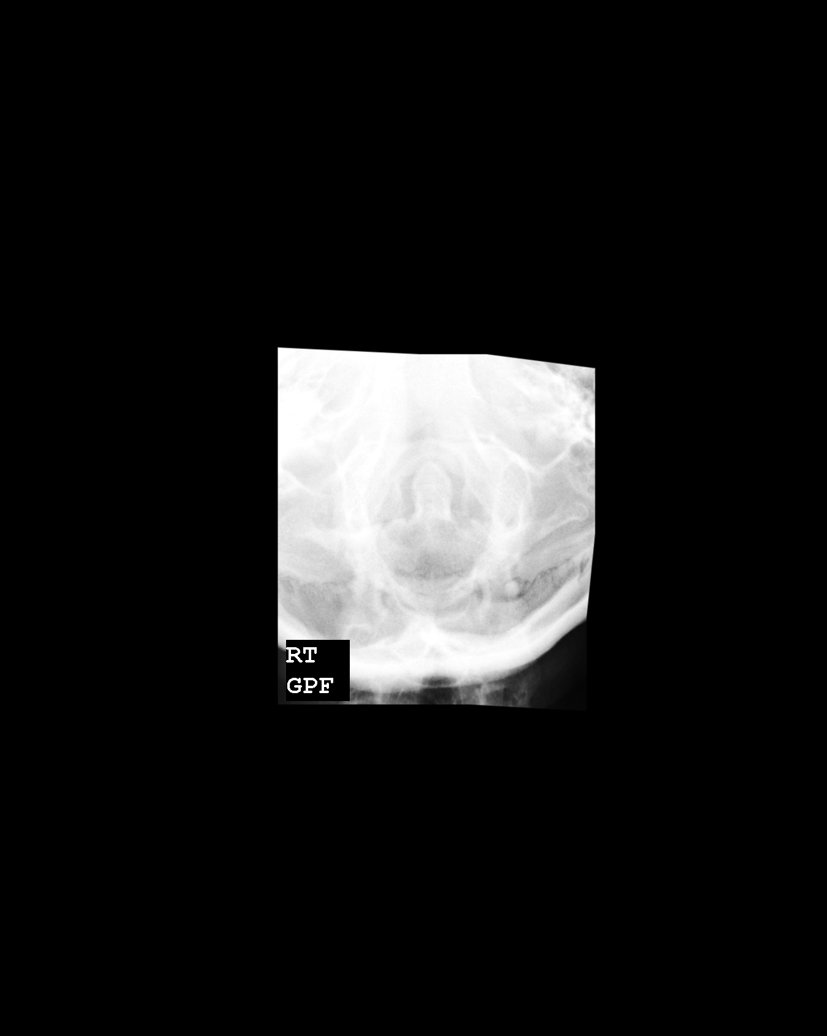
[im 5/5]
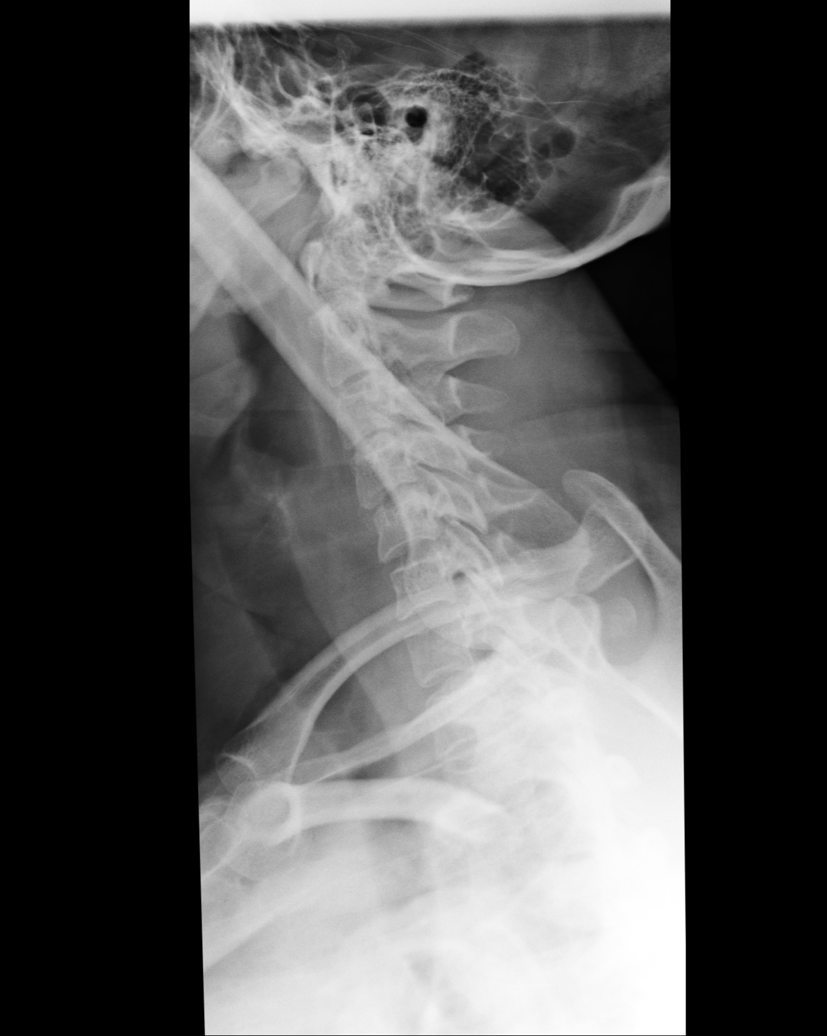

[5 of 5 positions shown; findings below may reference images not displayed]

FINDINGS: Five views of the cervical spine are complete to the level of the
T1 vertebral body.  There is no fracture or malalignment.  No soft tissue
swelling is seen.  There is minimal degenerative change identified in the
lower cervical spine.
IMPRESSION: No evidence of fracture or malalignment.  If concern persists, consider CT
evaluation.

## 2006-09-09 IMAGING — CR DG HIP COMPLETE 2+V*L*
1 series · 4 of 4 positions shown · non-contrast
Comparison: none

REASON FOR EXAM: fall bilat hip pain
COMMENTS:

PROCEDURE:     DXR - DXR HIP LEFT COMPLETE  - November 04, 2004  [DATE]
RESULT:        No acute bony or joint abnormality is identified.

[Series 1: view not recorded · 0.17mm/px · 4 of 4 slices shown]
[im 1/4]
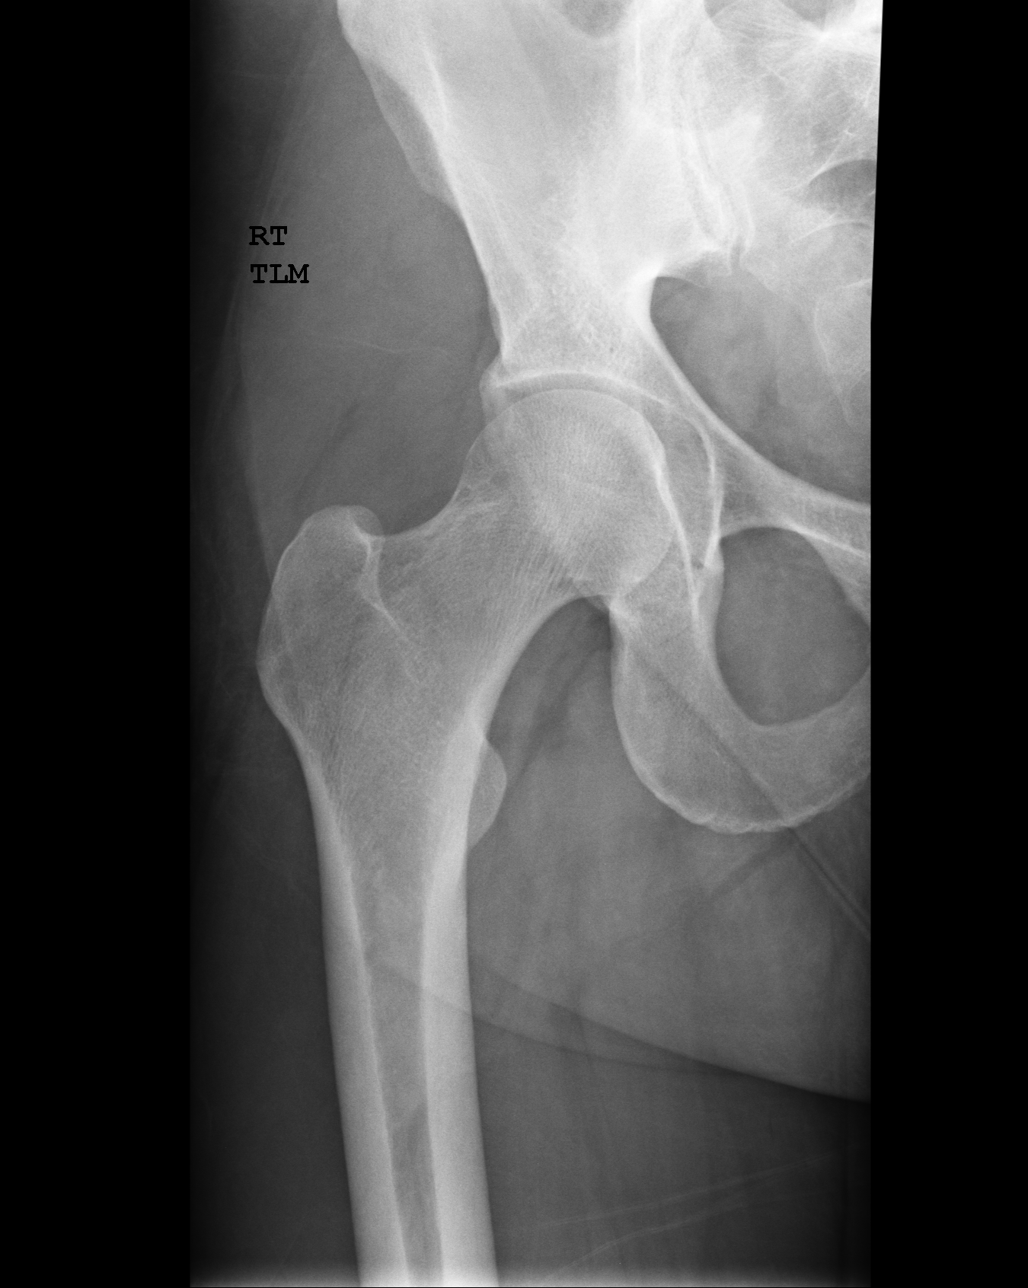
[im 2/4]
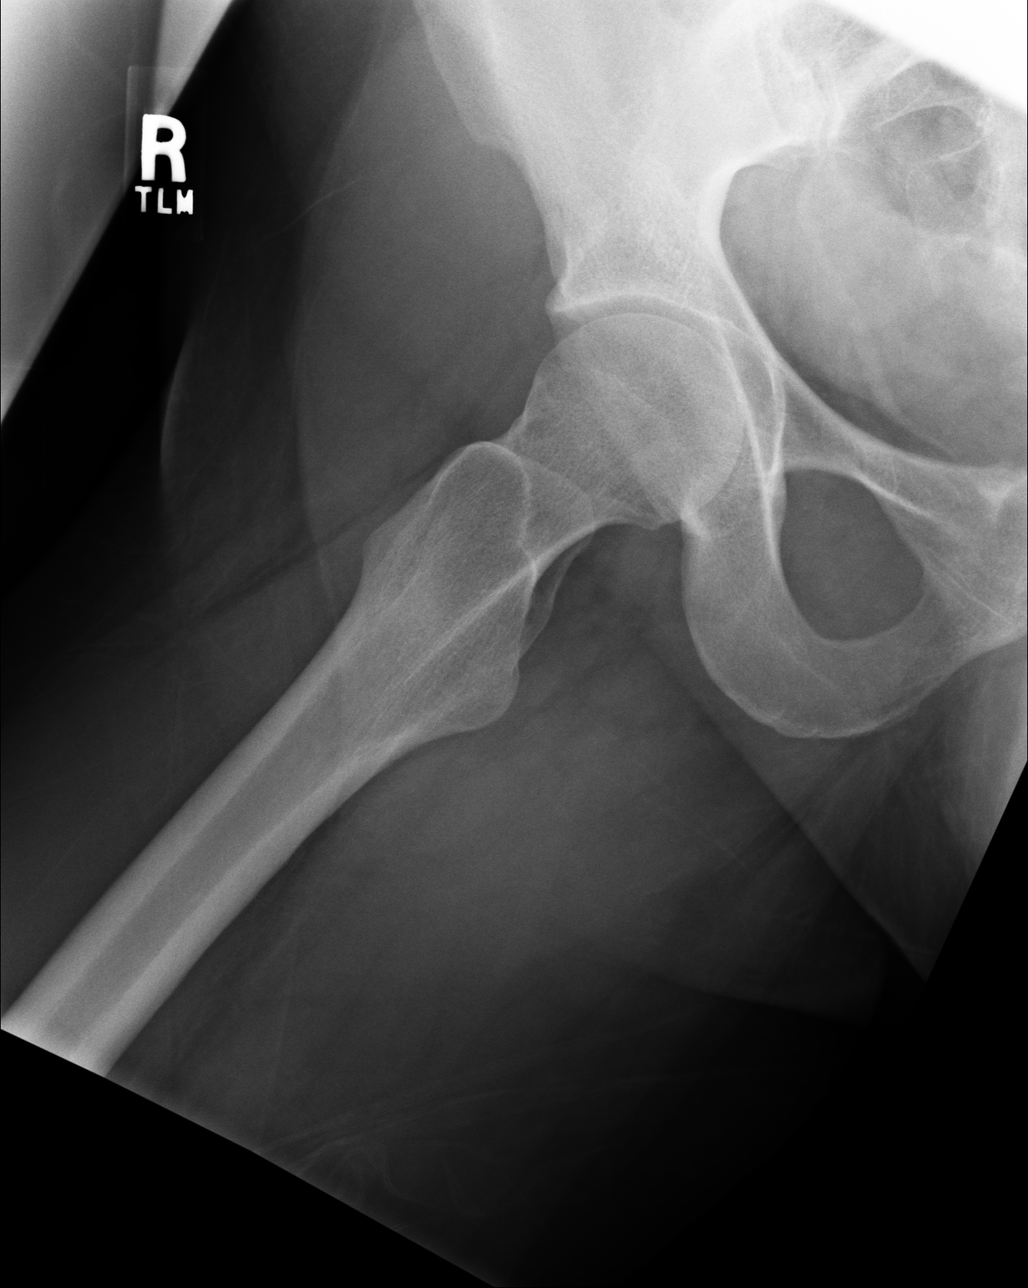
[im 3/4]
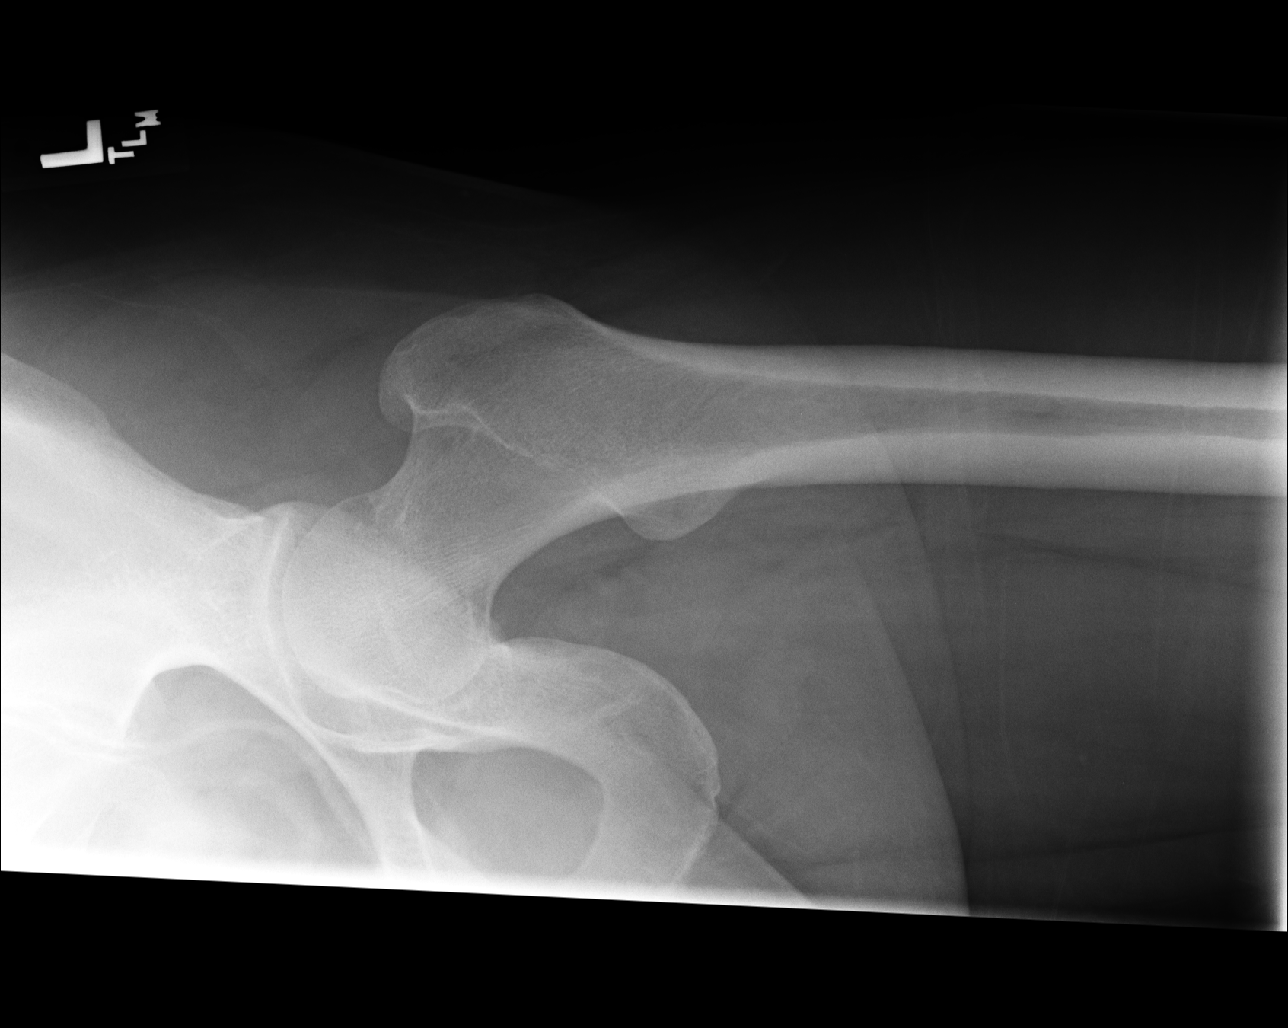
[im 4/4]
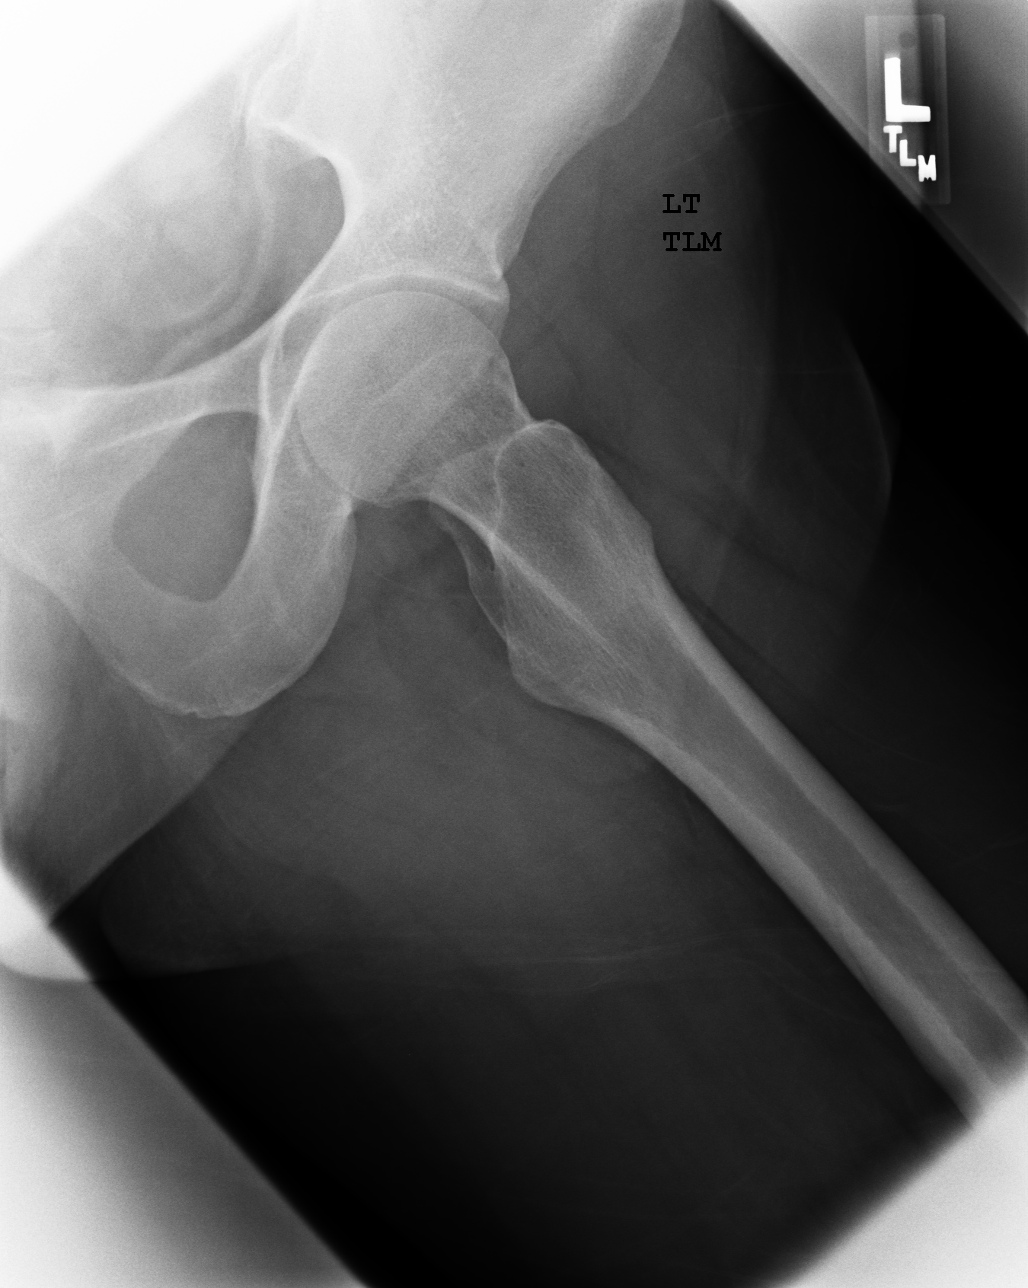

[4 of 4 positions shown; findings below may reference images not displayed]

IMPRESSION: See above.

## 2006-09-09 IMAGING — CR DG LUMBAR SPINE 2-3V
1 series · 3 of 3 positions shown · non-contrast
Comparison: none

REASON FOR EXAM: fall / [HOSPITAL]
COMMENTS:

PROCEDURE:     DXR - DXR LUMBAR SPINE AP AND LATERAL  - November 04, 2004  [DATE]
RESULT:       No acute soft tissue or bony abnormality is identified.

[Series 1: view not recorded · 0.17mm/px · 3 of 3 slices shown]
[im 1/3]
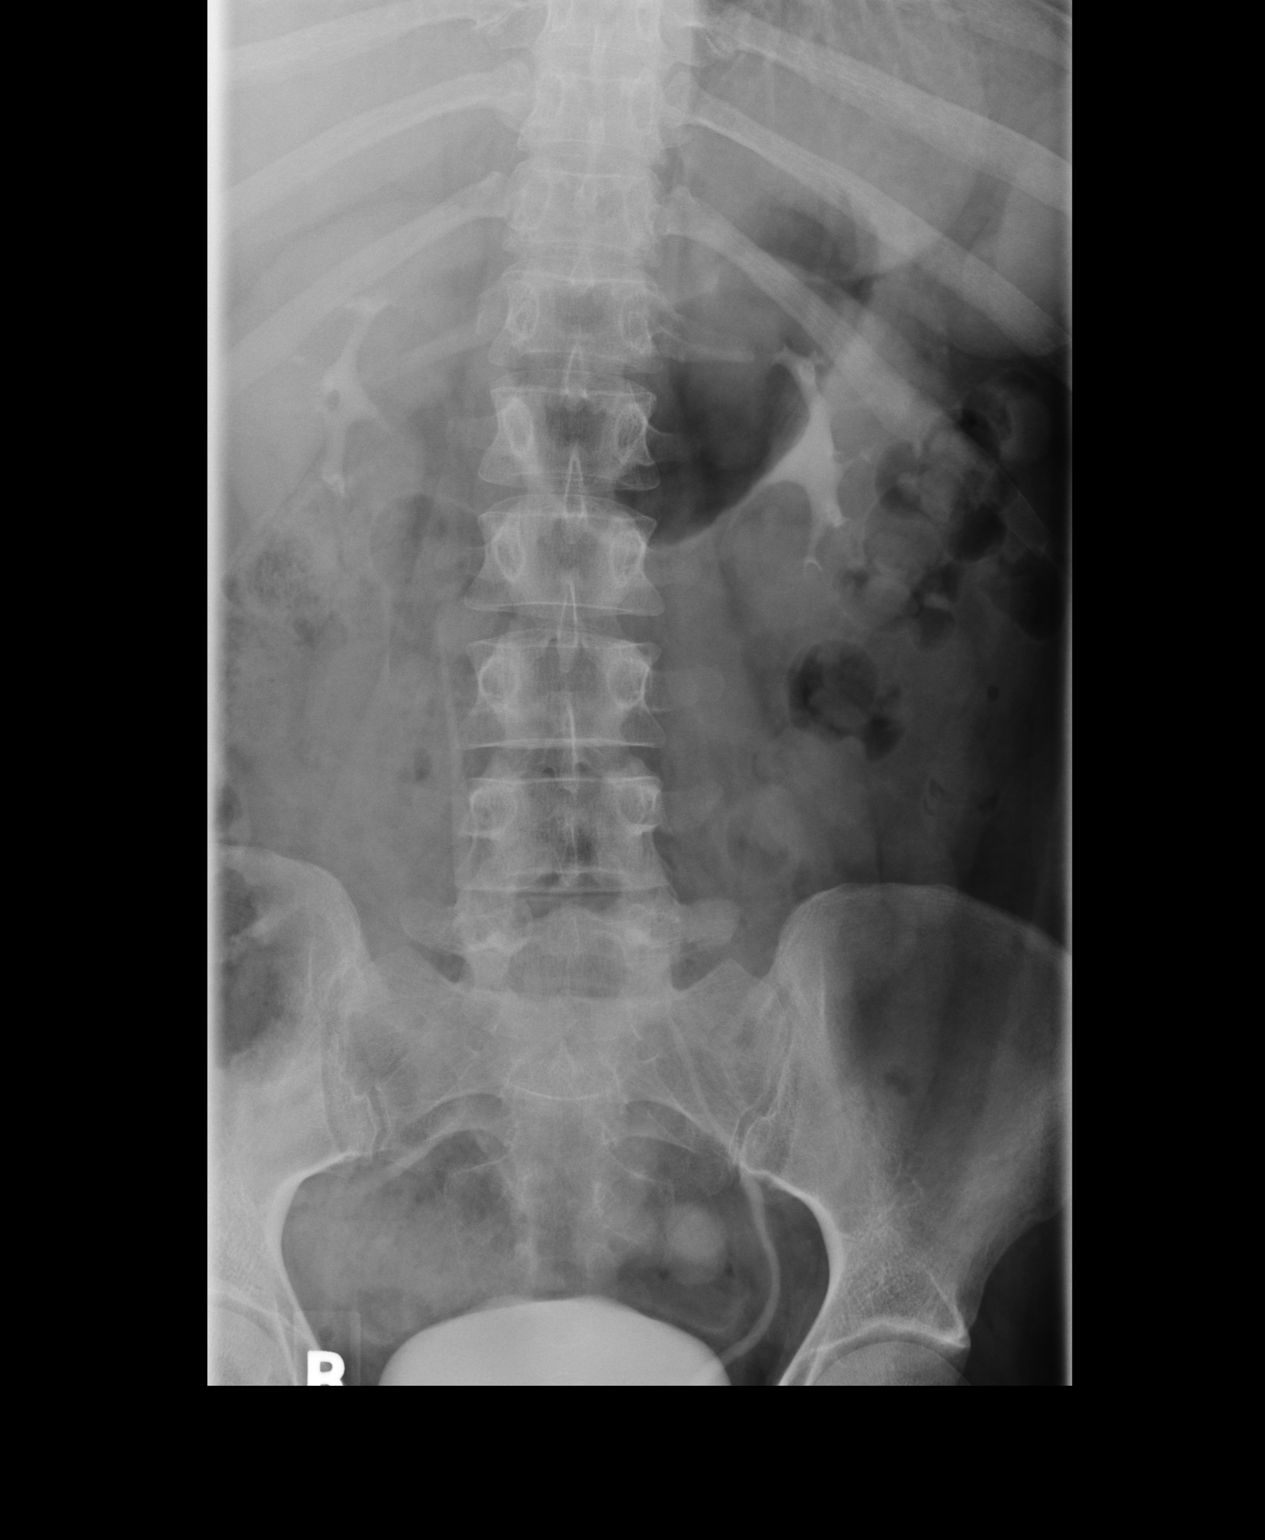
[im 2/3]
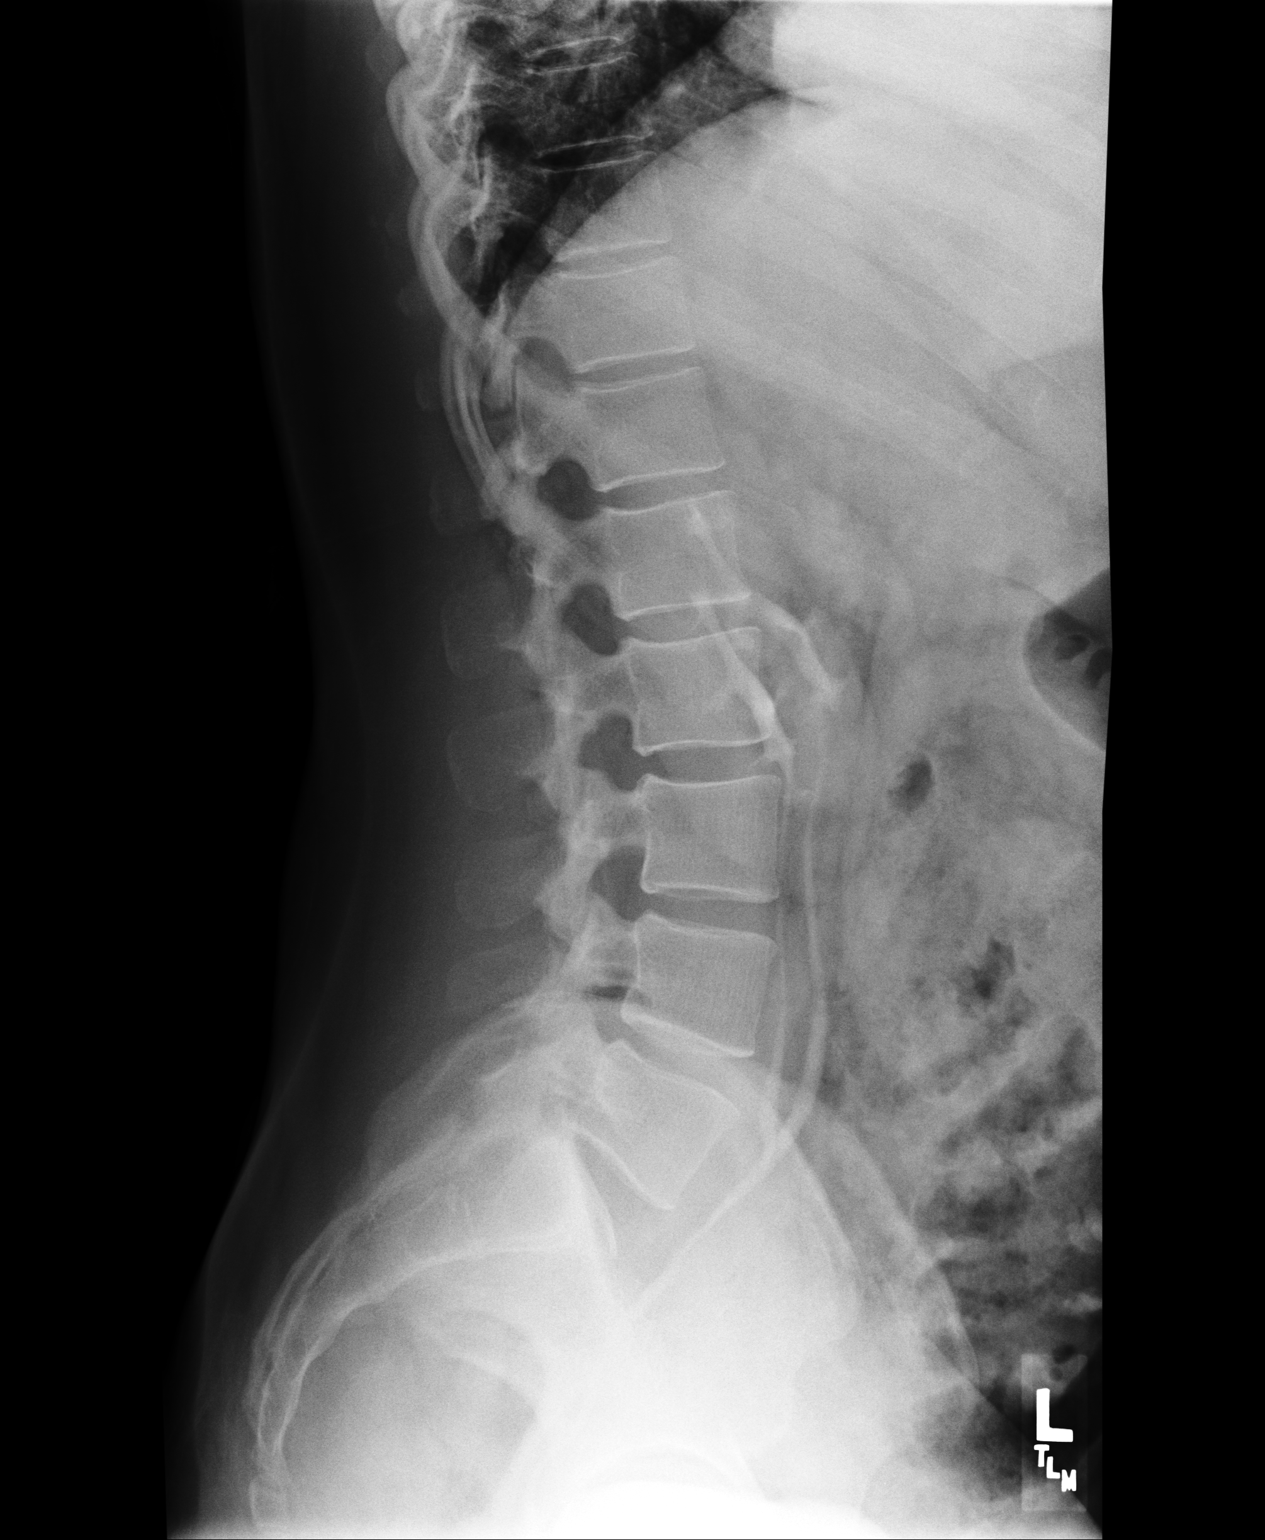
[im 3/3]
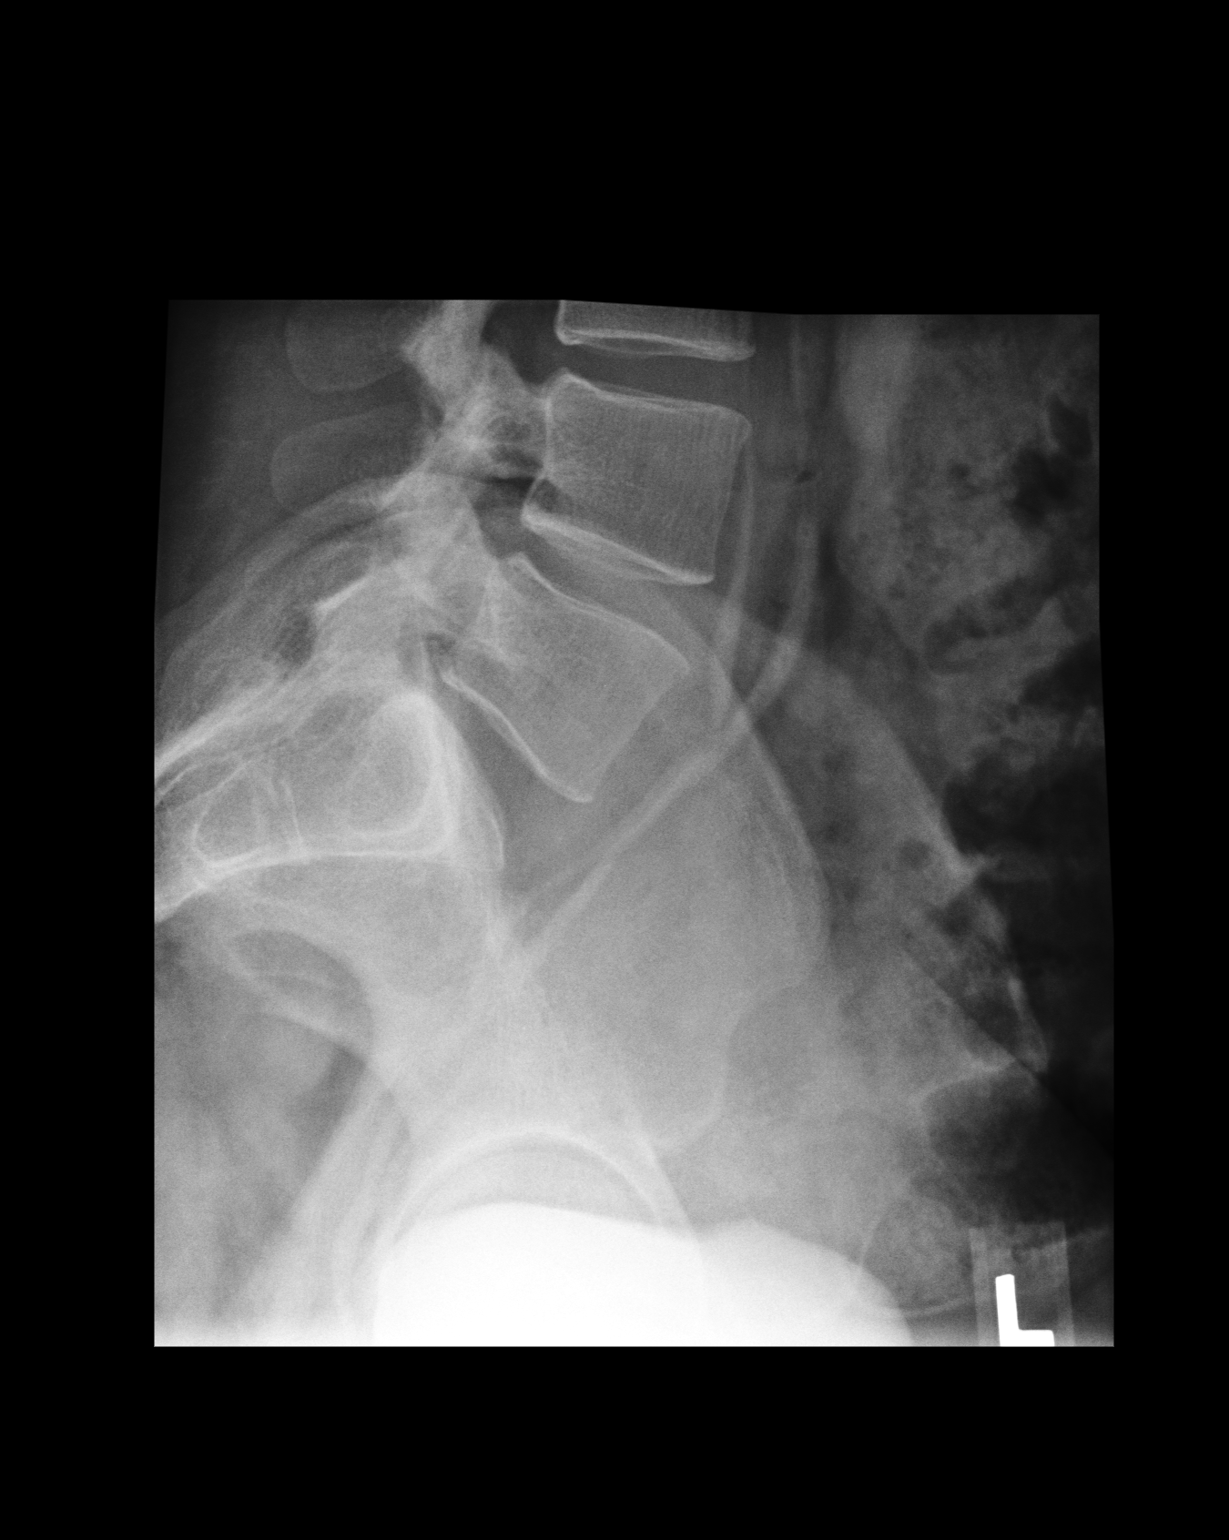

[3 of 3 positions shown; findings below may reference images not displayed]

IMPRESSION: See above.

## 2006-10-30 ENCOUNTER — Ambulatory Visit: Payer: Self-pay | Admitting: Emergency Medicine

## 2006-11-07 ENCOUNTER — Ambulatory Visit: Payer: Self-pay | Admitting: Internal Medicine

## 2006-11-21 ENCOUNTER — Other Ambulatory Visit: Payer: Self-pay

## 2006-11-21 ENCOUNTER — Emergency Department: Payer: Self-pay | Admitting: Emergency Medicine

## 2006-11-23 ENCOUNTER — Ambulatory Visit: Payer: Self-pay | Admitting: Emergency Medicine

## 2006-12-09 ENCOUNTER — Ambulatory Visit: Payer: Self-pay | Admitting: Emergency Medicine

## 2007-04-16 IMAGING — CR DG CHEST 2V
1 series · 2 of 2 positions shown · non-contrast
Comparison: none

REASON FOR EXAM: Benign hypertension
COMMENTS:

PROCEDURE:     DXR - DXR CHEST PA (OR AP) AND LATERAL  - August 05, 2006  [DATE]
RESULT:     The lung fields are clear. The heart, mediastinal and osseous
structures show no significant abnormalities.

[Series 1: view not recorded · 0.17mm/px · 2 of 2 slices shown]
[im 1/2]
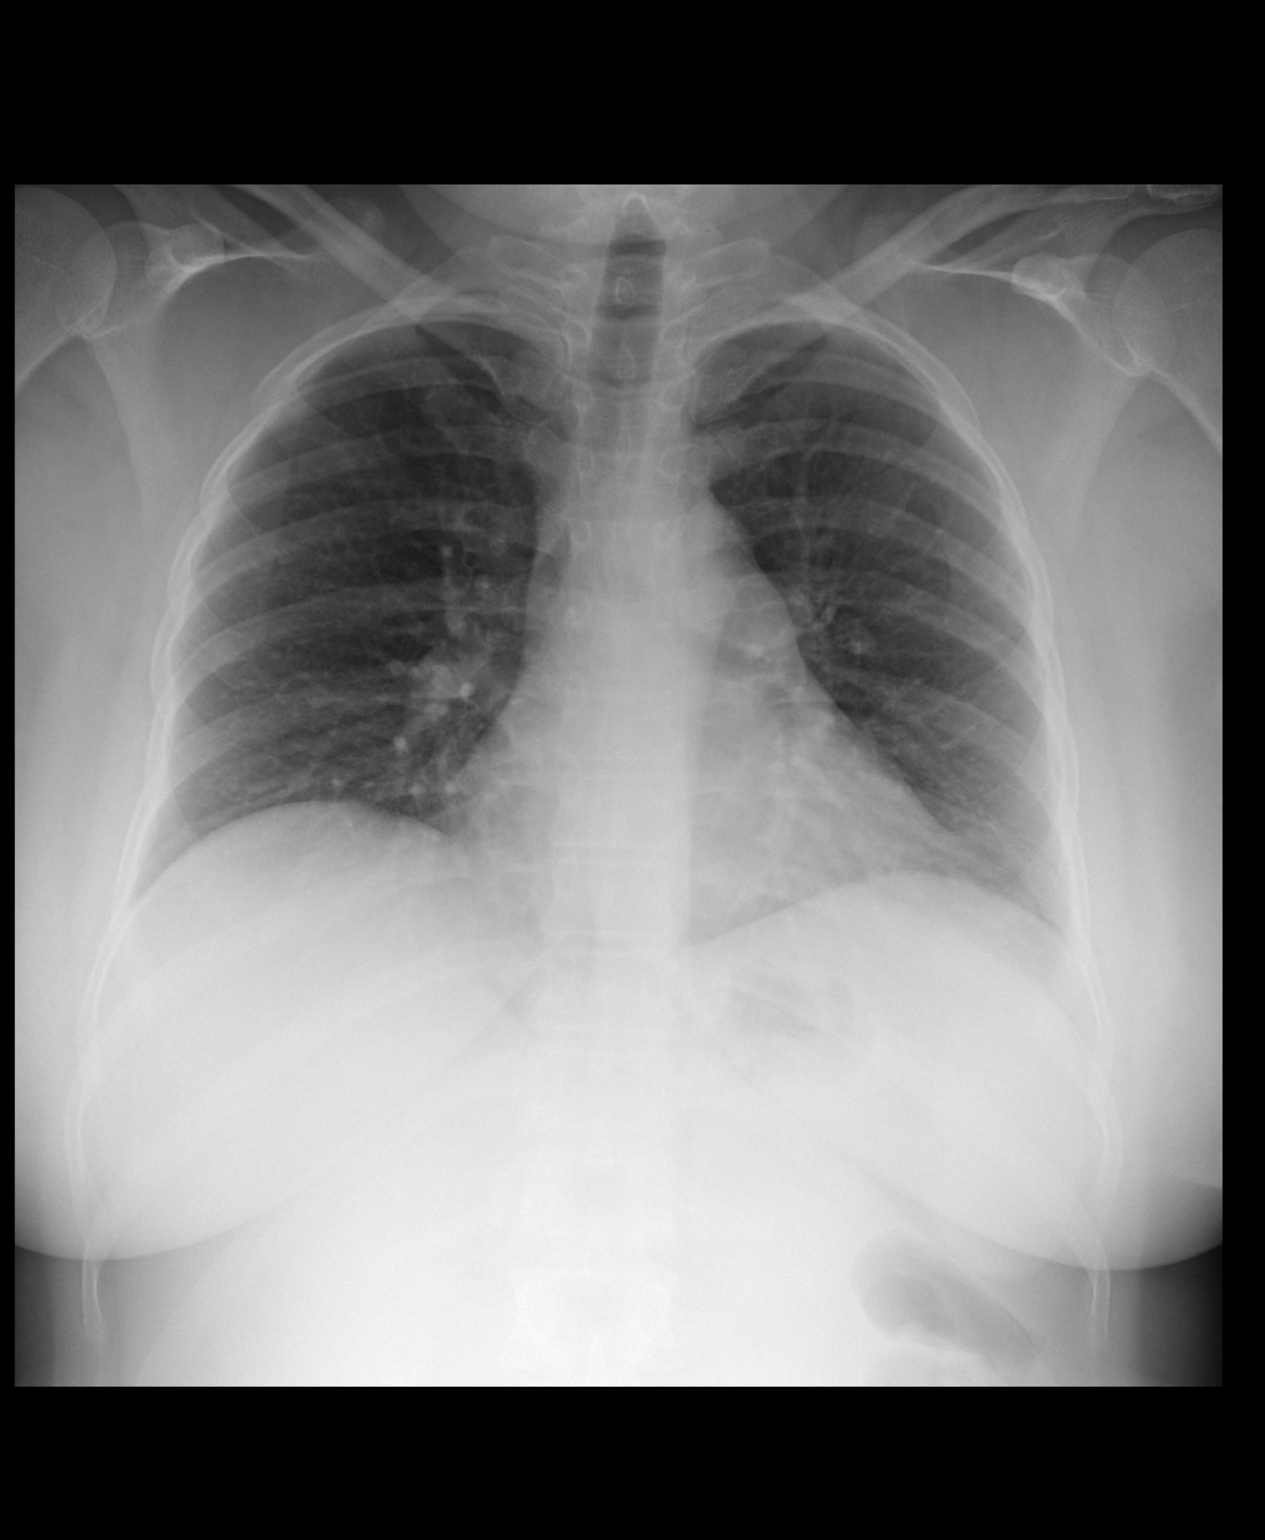
[im 2/2]
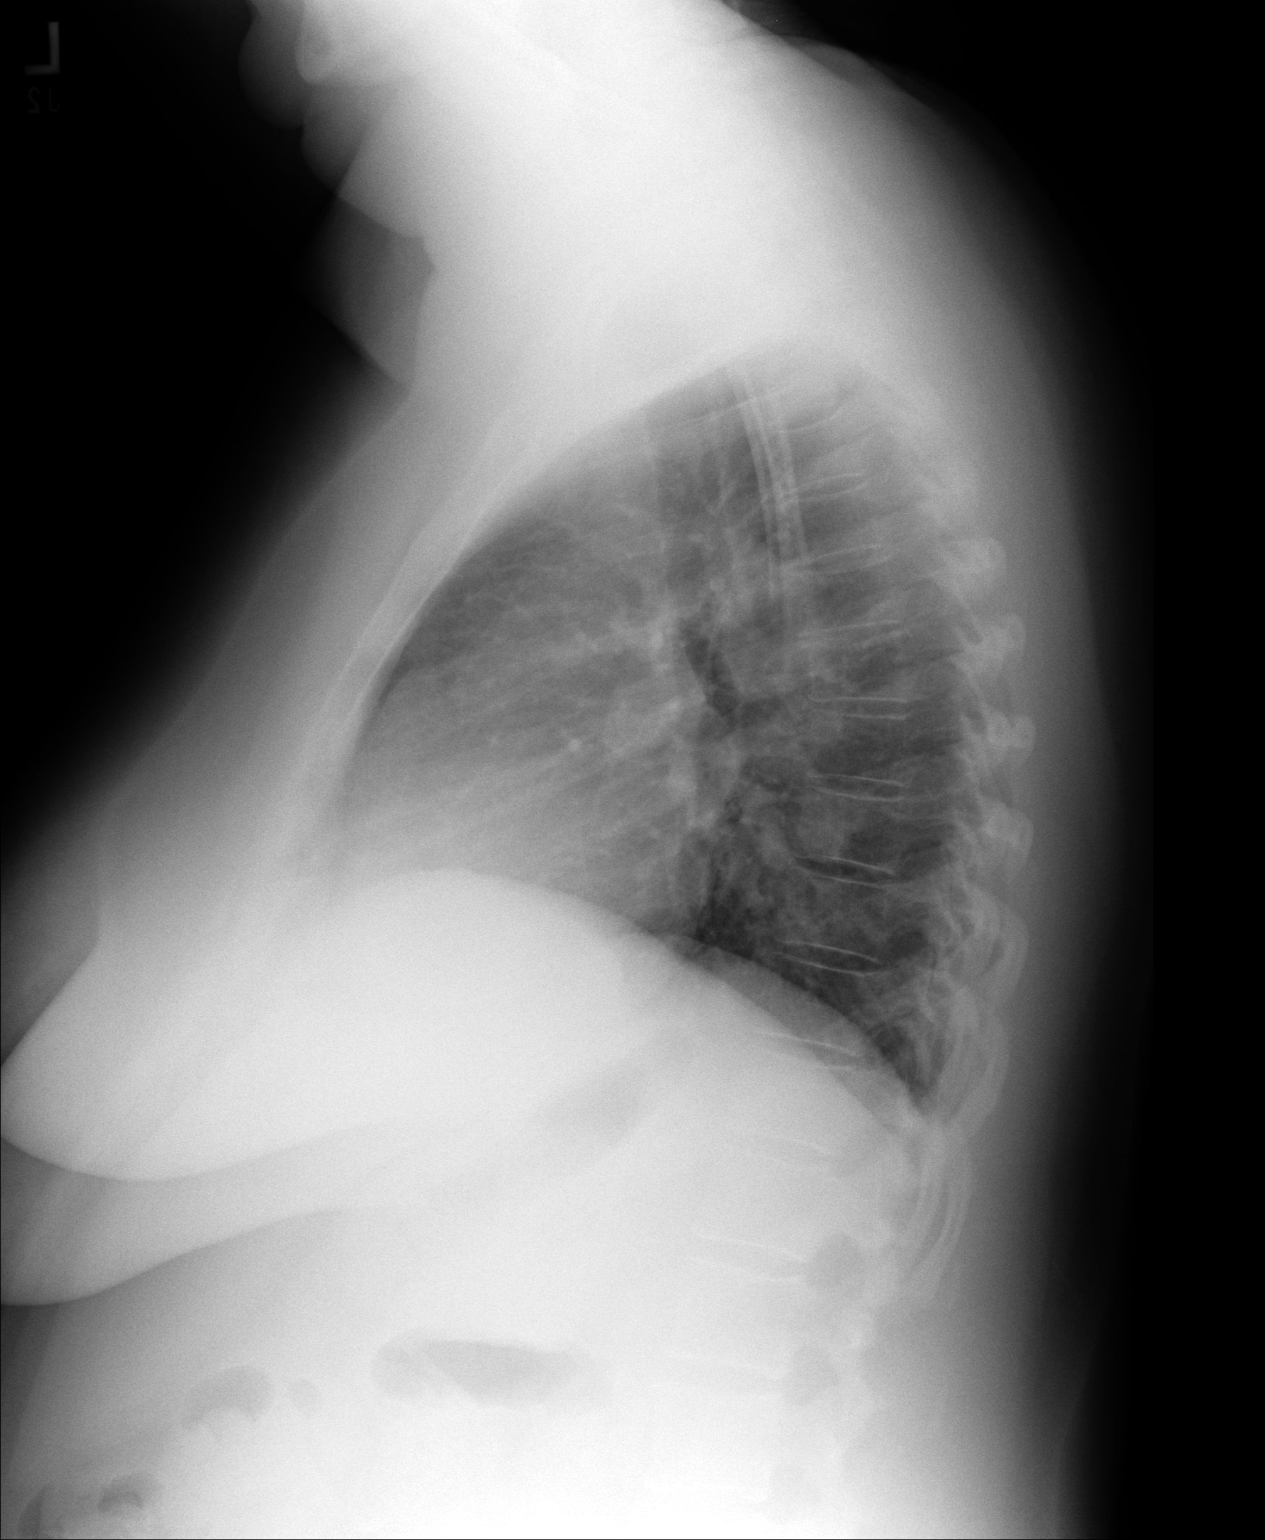

[2 of 2 positions shown; findings below may reference images not displayed]

IMPRESSION: No acute changes are identified.

## 2007-04-23 ENCOUNTER — Emergency Department: Payer: Self-pay | Admitting: Emergency Medicine

## 2007-04-23 ENCOUNTER — Other Ambulatory Visit: Payer: Self-pay

## 2007-04-30 ENCOUNTER — Ambulatory Visit: Payer: Self-pay | Admitting: Internal Medicine

## 2007-05-26 ENCOUNTER — Ambulatory Visit: Payer: Self-pay | Admitting: Internal Medicine

## 2007-06-06 ENCOUNTER — Other Ambulatory Visit: Payer: Self-pay

## 2007-06-06 ENCOUNTER — Inpatient Hospital Stay: Payer: Self-pay | Admitting: Internal Medicine

## 2007-06-18 IMAGING — CR DG CHEST 2V
1 series · 2 of 2 positions shown · non-contrast
Comparison: none

REASON FOR EXAM: Cough
COMMENTS:

PROCEDURE:     DXR - DXR CHEST PA (OR AP) AND LATERAL  - August 13, 2005 [DATE]
RESULT:     PA and lateral view reveals the cardiomediastinal structures to
be within normal limits.  The lung fields are clear.  Vascularity is within
normal limits.  No effusions are noted.

[Series 1: view not recorded · 0.17mm/px · 2 of 2 slices shown]
[im 1/2]
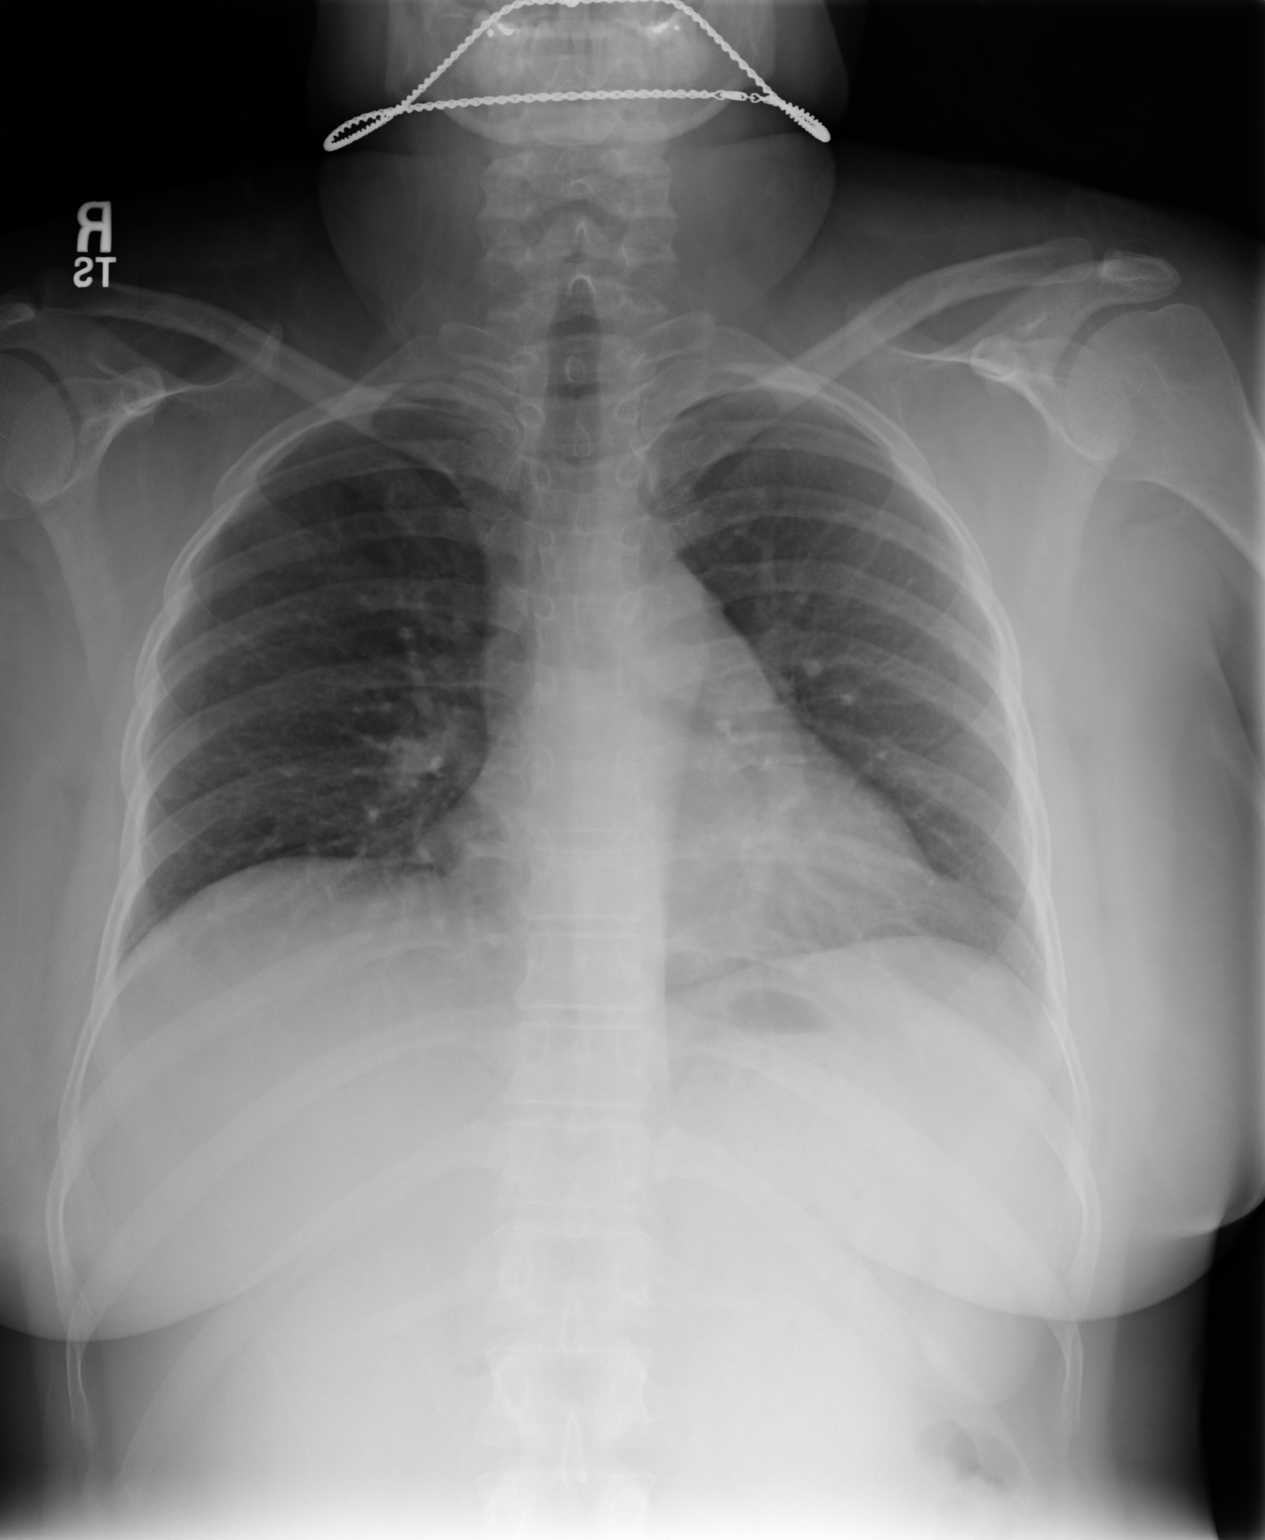
[im 2/2]
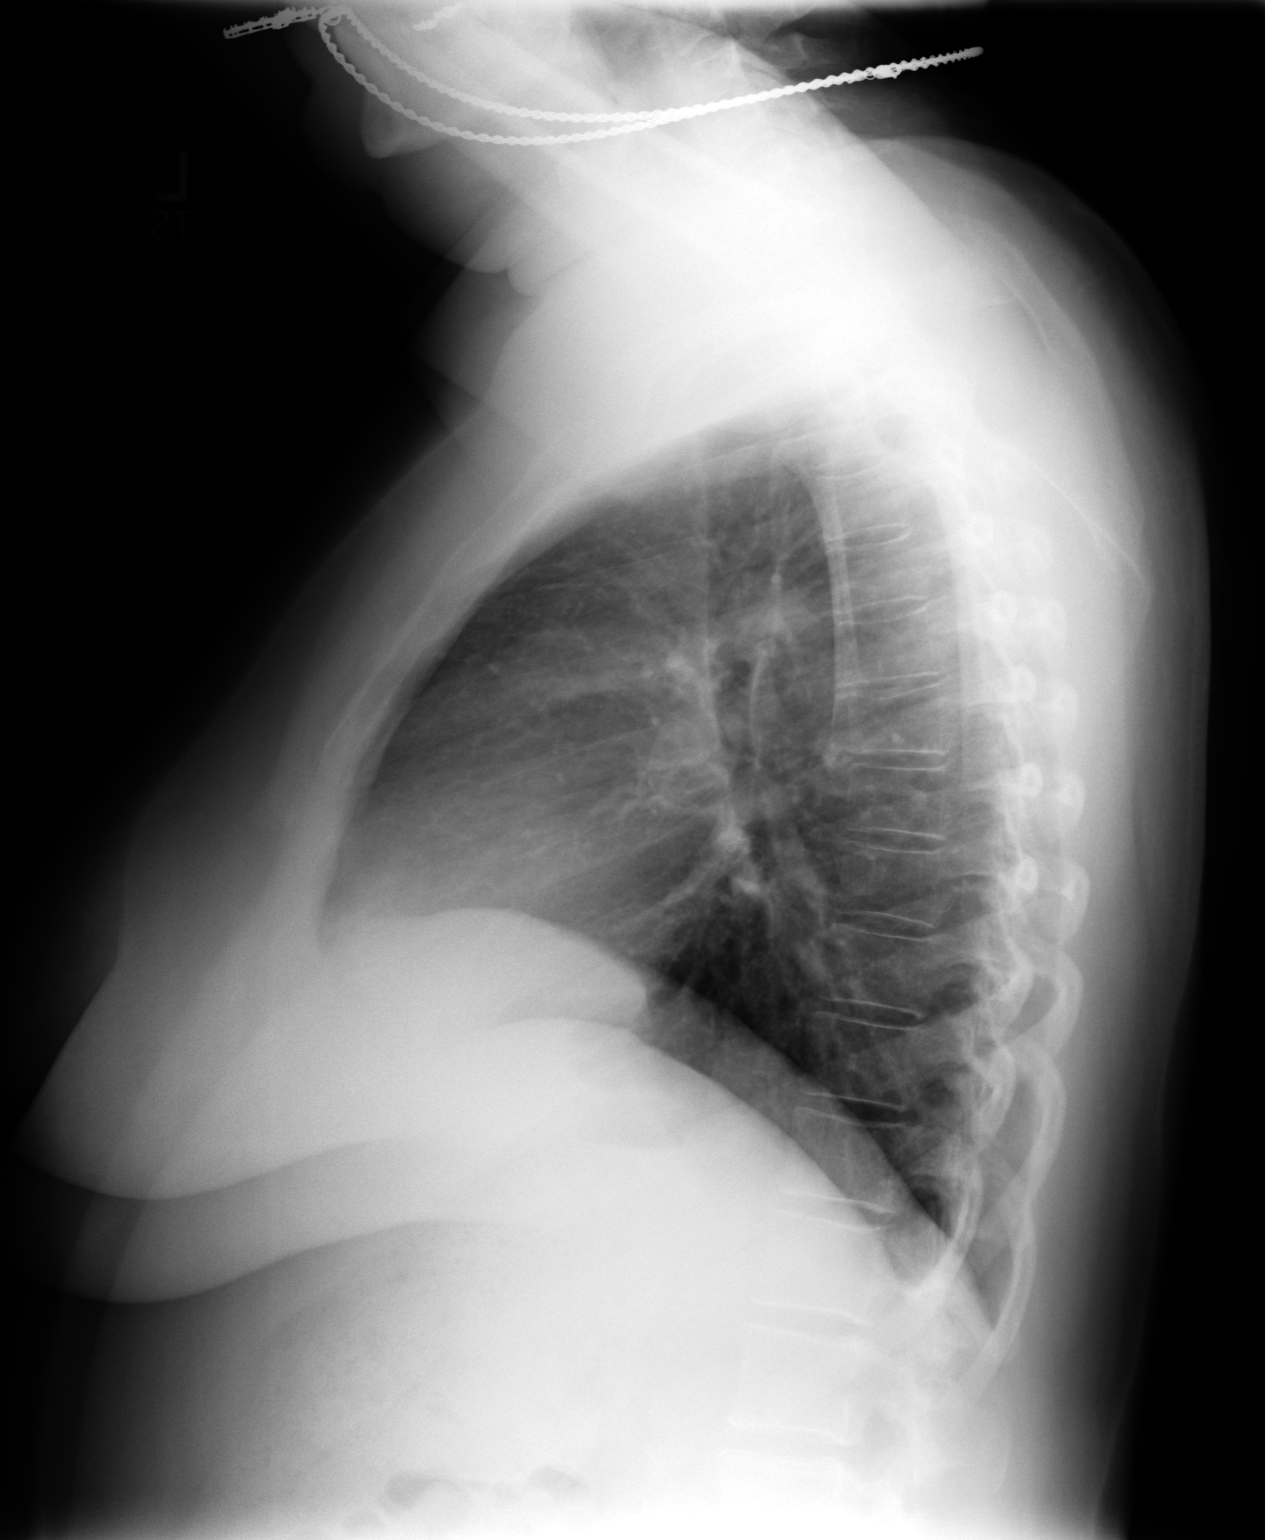

[2 of 2 positions shown; findings below may reference images not displayed]

IMPRESSION: Lung fields are free of active infiltrates.  Chest is basically unchanged
from the prior study.

## 2007-07-09 ENCOUNTER — Emergency Department: Payer: Self-pay | Admitting: Emergency Medicine

## 2007-07-11 ENCOUNTER — Emergency Department: Payer: Self-pay | Admitting: Emergency Medicine

## 2007-07-11 IMAGING — CR DG CHEST 1V
1 series · 1 of 1 positions shown · non-contrast
Comparison: none

REASON FOR EXAM: constipation
COMMENTS:

PROCEDURE:     MDR - MDR CHEST 1 VIEW AP OR PA  - October 30, 2006  [DATE]
RESULT:     There is mild elevation of the RIGHT hemidiaphragm. The lungs
are clear. The heart and pulmonary vessels are normal. There is no
infiltrate, effusion or failure.

[view not recorded]
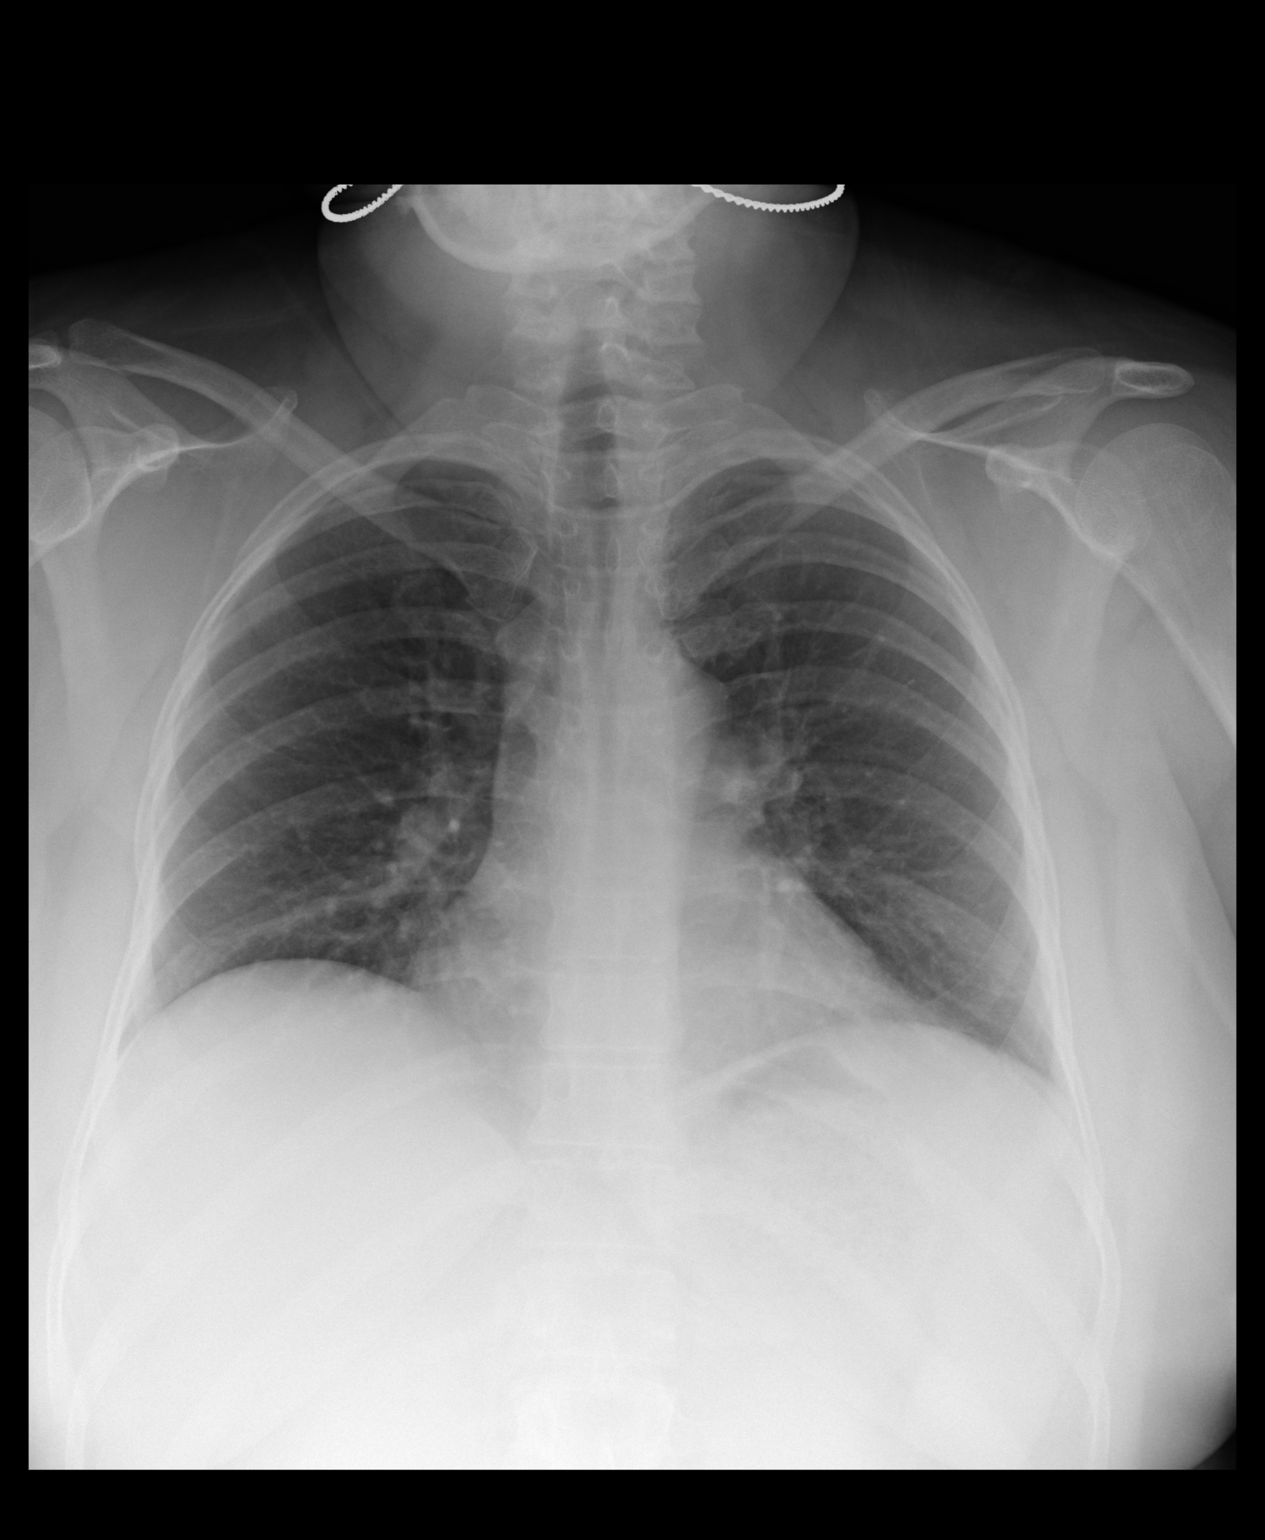

[1 of 1 positions shown; findings below may reference images not displayed]

IMPRESSION: No acute abnormality demonstrated.

## 2007-07-27 ENCOUNTER — Ambulatory Visit: Payer: Self-pay | Admitting: Family Medicine

## 2007-08-06 ENCOUNTER — Ambulatory Visit: Payer: Self-pay | Admitting: Family Medicine

## 2007-08-28 ENCOUNTER — Ambulatory Visit: Payer: Self-pay | Admitting: Family Medicine

## 2007-09-09 ENCOUNTER — Ambulatory Visit: Payer: Self-pay | Admitting: Internal Medicine

## 2007-09-16 ENCOUNTER — Ambulatory Visit: Payer: Self-pay | Admitting: Family Medicine

## 2007-09-19 ENCOUNTER — Ambulatory Visit: Payer: Self-pay | Admitting: Family Medicine

## 2007-09-19 ENCOUNTER — Inpatient Hospital Stay: Payer: Self-pay | Admitting: Internal Medicine

## 2007-09-27 ENCOUNTER — Other Ambulatory Visit: Payer: Self-pay

## 2007-09-27 ENCOUNTER — Emergency Department: Payer: Self-pay | Admitting: Emergency Medicine

## 2007-11-23 ENCOUNTER — Ambulatory Visit: Payer: Self-pay | Admitting: Family Medicine

## 2007-11-30 ENCOUNTER — Emergency Department: Payer: Self-pay | Admitting: Emergency Medicine

## 2007-12-13 ENCOUNTER — Other Ambulatory Visit: Payer: Self-pay

## 2007-12-13 ENCOUNTER — Emergency Department: Payer: Self-pay | Admitting: Emergency Medicine

## 2008-01-22 ENCOUNTER — Ambulatory Visit: Payer: Self-pay | Admitting: Rheumatology

## 2008-01-27 ENCOUNTER — Emergency Department: Payer: Self-pay | Admitting: Emergency Medicine

## 2008-02-01 ENCOUNTER — Ambulatory Visit: Payer: Self-pay | Admitting: Internal Medicine

## 2008-02-18 ENCOUNTER — Other Ambulatory Visit: Payer: Self-pay

## 2008-02-18 ENCOUNTER — Emergency Department: Payer: Self-pay | Admitting: Emergency Medicine

## 2008-02-22 ENCOUNTER — Emergency Department: Payer: Self-pay | Admitting: Emergency Medicine

## 2008-02-23 ENCOUNTER — Ambulatory Visit: Payer: Self-pay | Admitting: Family Medicine

## 2008-03-03 ENCOUNTER — Ambulatory Visit: Payer: Self-pay | Admitting: Family Medicine

## 2008-03-20 ENCOUNTER — Emergency Department: Payer: Self-pay | Admitting: Emergency Medicine
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
## 2008-04-05 ENCOUNTER — Emergency Department: Payer: Self-pay | Admitting: Emergency Medicine

## 2008-04-11 ENCOUNTER — Ambulatory Visit: Payer: Self-pay | Admitting: Family Medicine

## 2008-05-03 ENCOUNTER — Ambulatory Visit: Payer: Self-pay | Admitting: Internal Medicine

## 2008-05-20 IMAGING — CR LEFT WRIST - COMPLETE 3+ VIEW
1 series · 4 of 4 positions shown · non-contrast
Comparison: none

REASON FOR EXAM: Pain
COMMENTS:

[Series 1: view not recorded · 0.17mm/px · 4 of 4 slices shown]
[im 1/4]
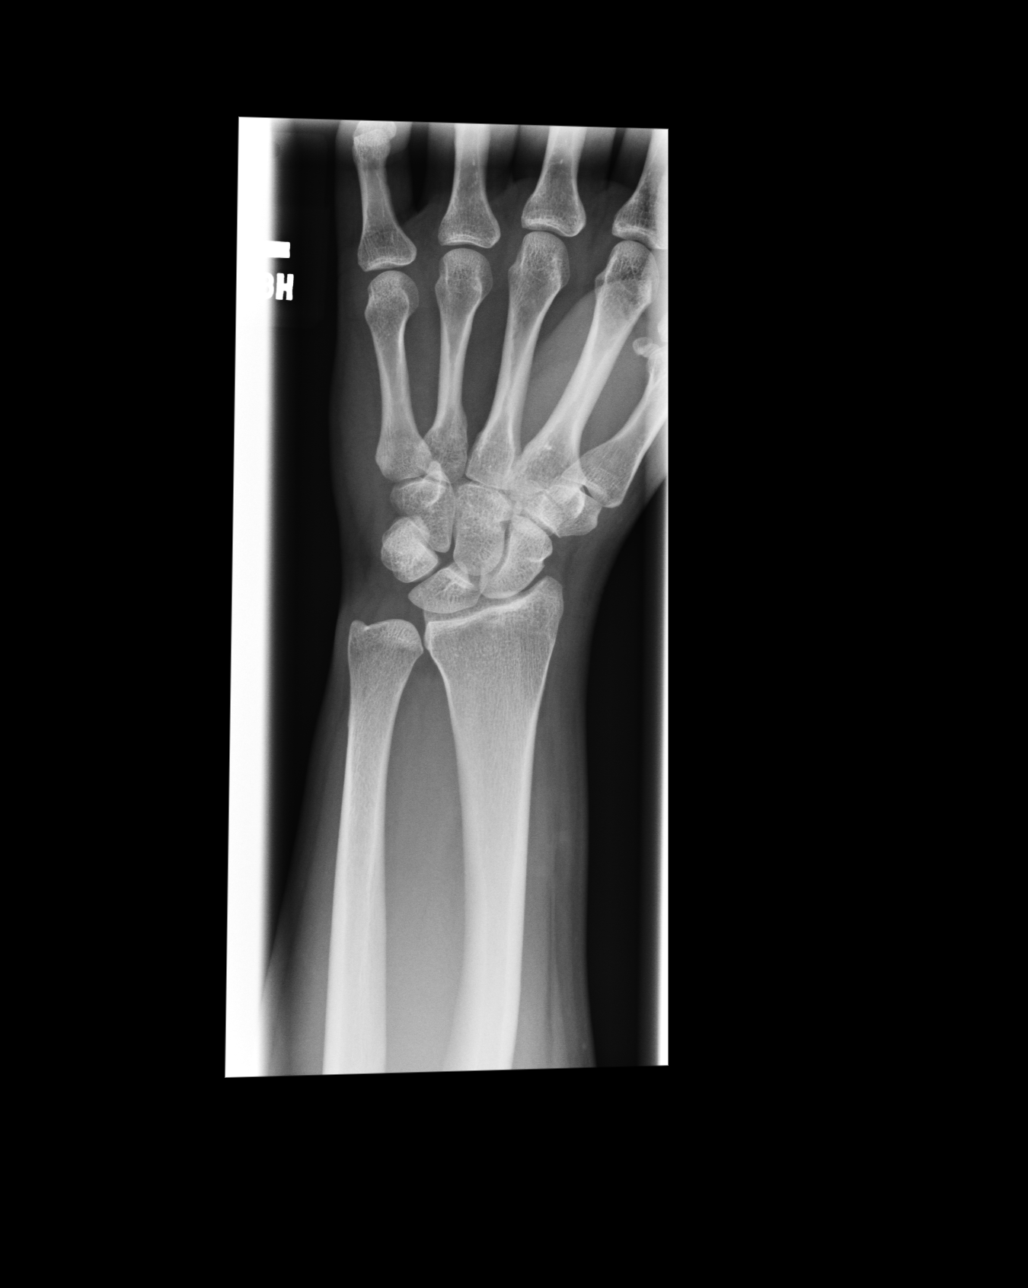
[im 2/4]
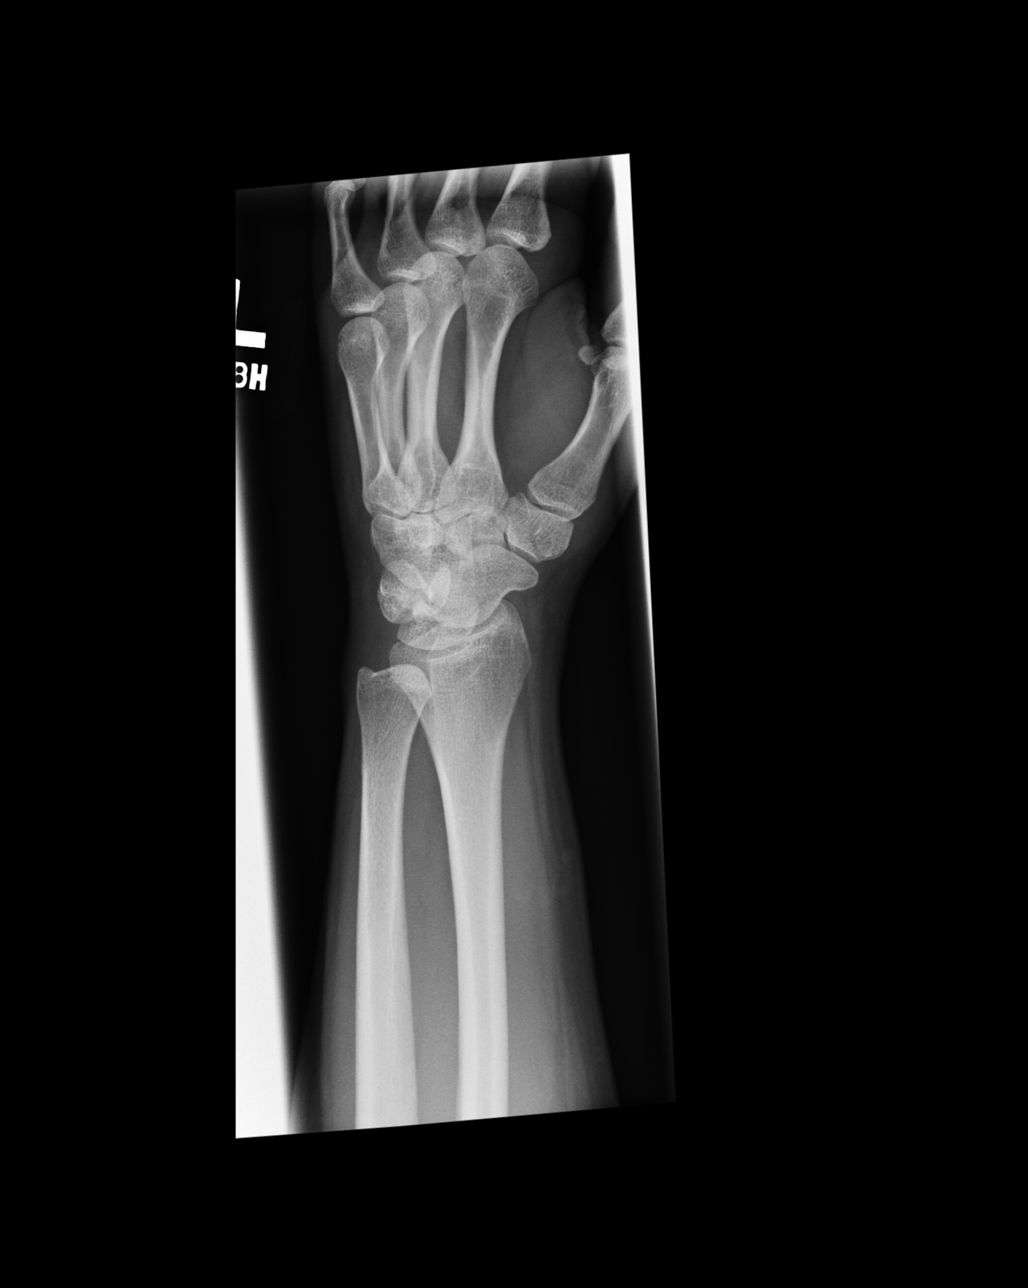
[im 3/4]
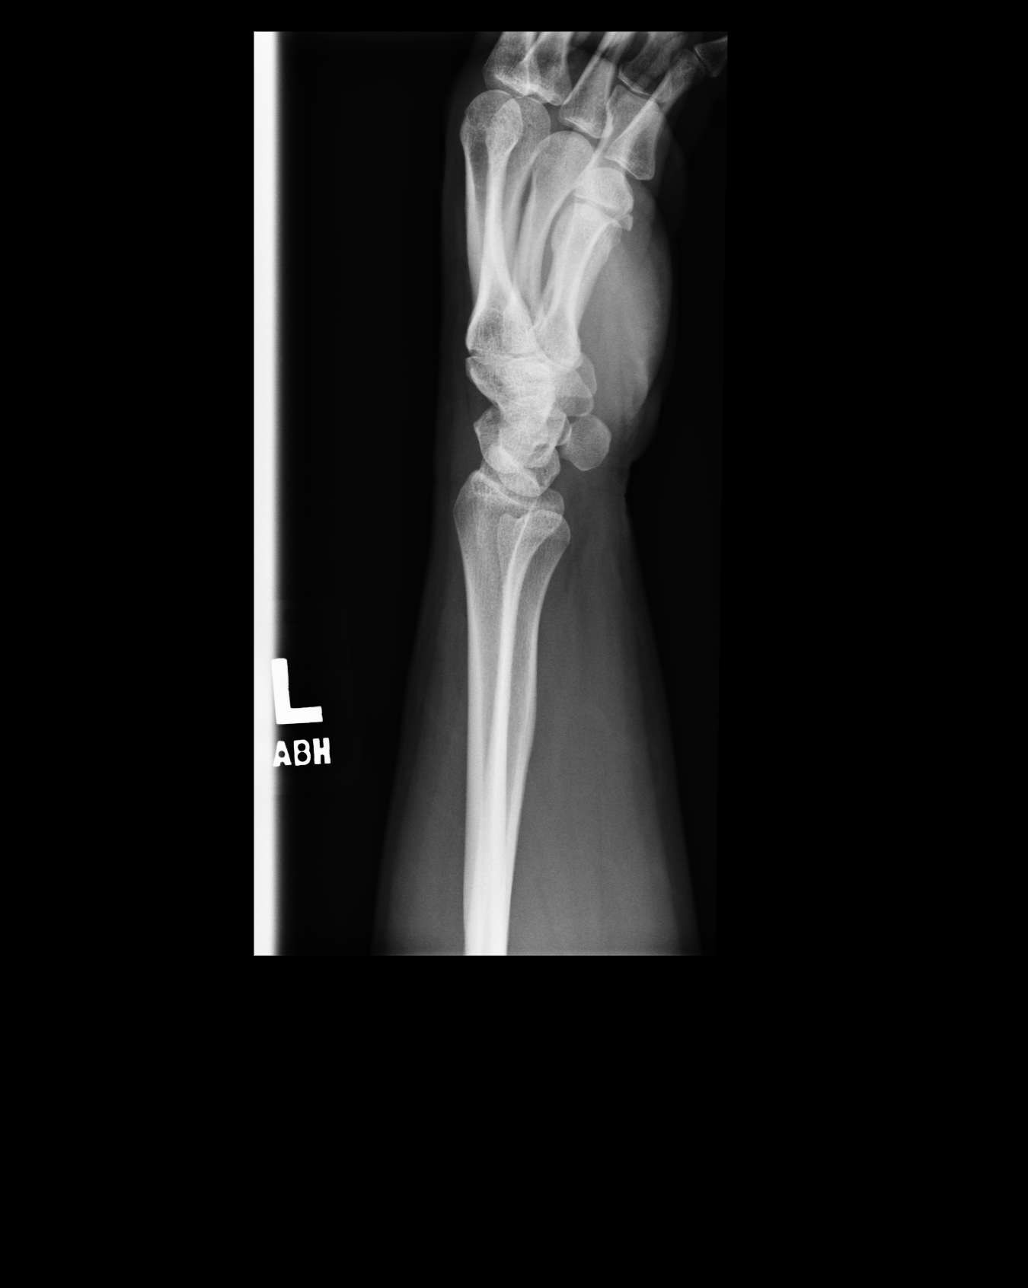
[im 4/4]
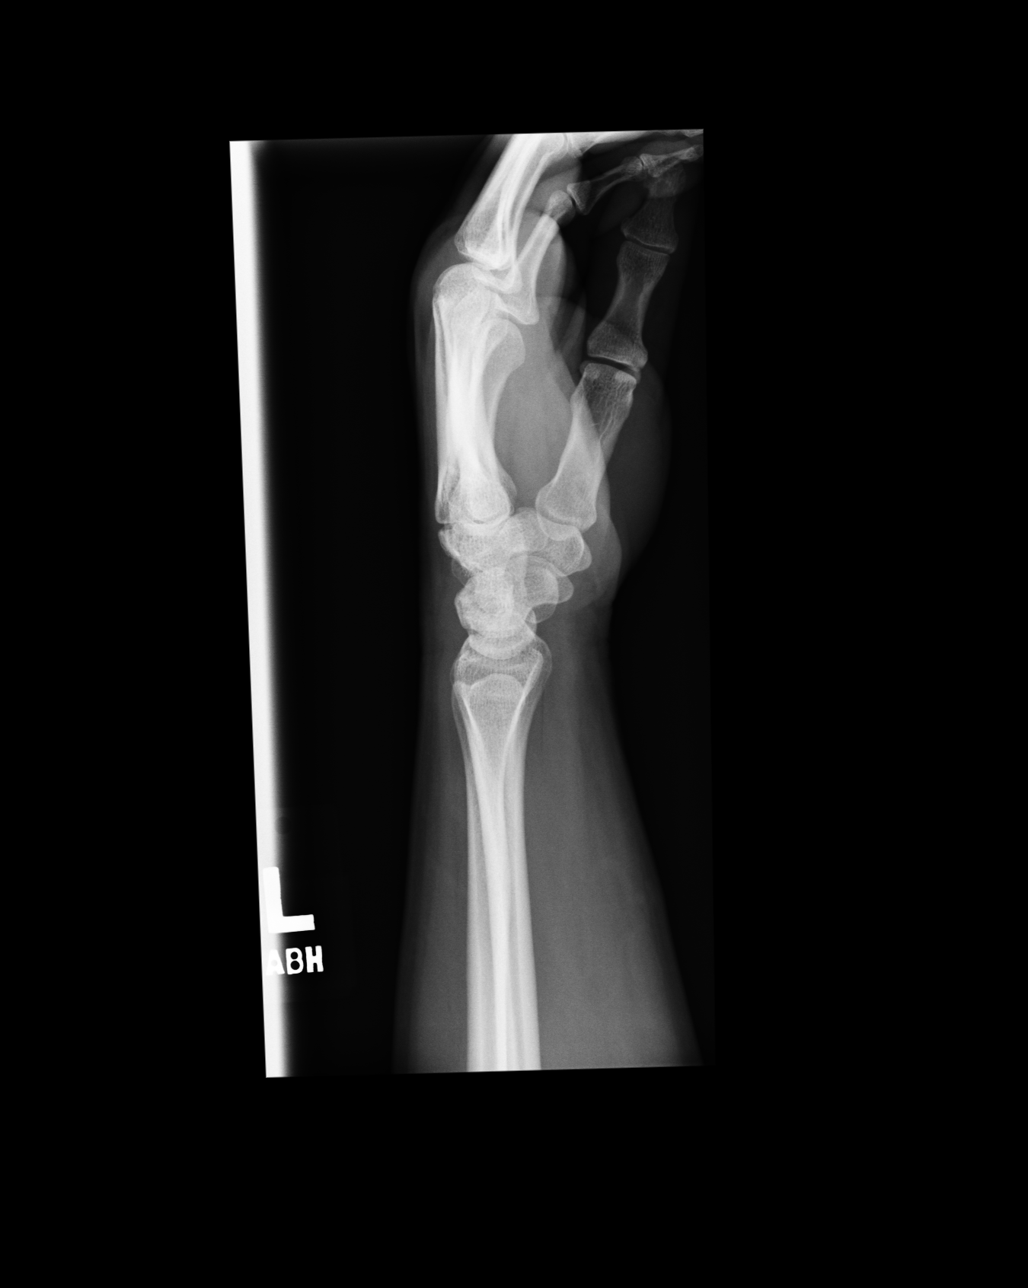

[4 of 4 positions shown; findings below may reference images not displayed]

PROCEDURE:     MDR - MDR WRIST LT COMP WITH OBLIQUES  - September 09, 2007  [DATE]

RESULT:     Four views of the LEFT wrist reveal the bones to be adequately
mineralized. I do not see objective evidence of an acute displaced fracture.
No significant degenerative change is identified. The overlying soft tissues
are normal in appearance.
IMPRESSION: I see no acute bony abnormality of the LEFT elbow.

## 2008-05-26 IMAGING — US ULTRASOUND RIGHT BREAST
1 series · 17 of 25 positions shown · non-contrast
Comparison: none

REASON FOR EXAM: right breast nodular density
COMMENTS:

[Series 1: ultrasound right breast · 17 of 33 slices shown]
[im 1/33]
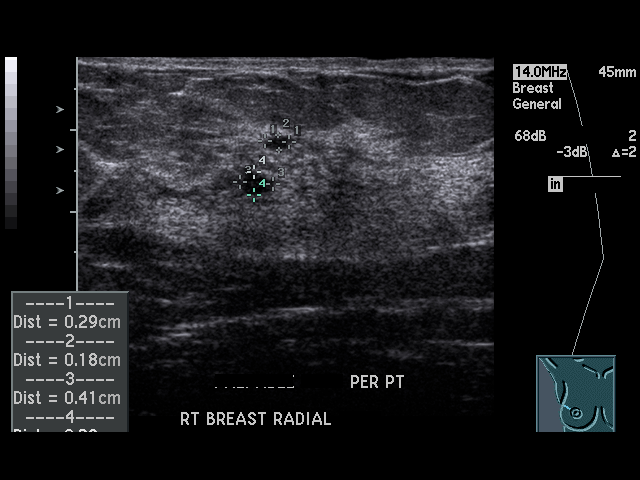
[im 3/33]
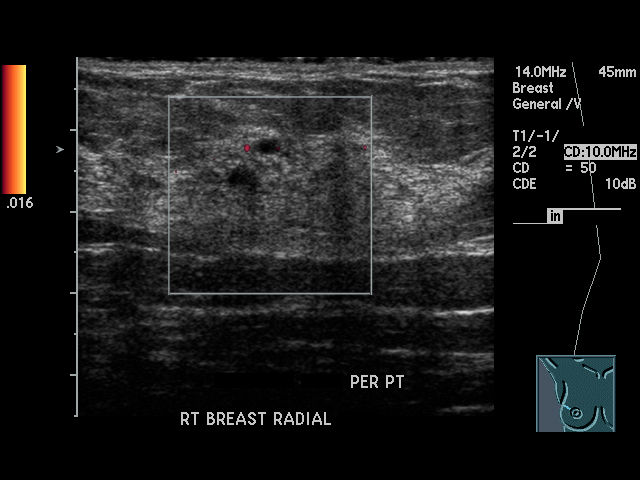
[im 5/33]
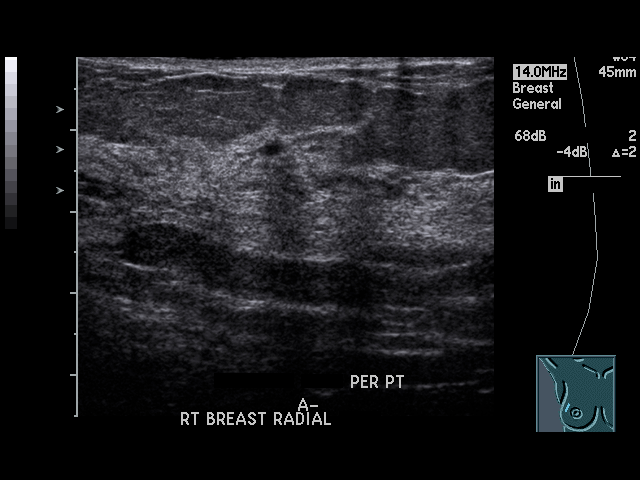
[im 7/33]
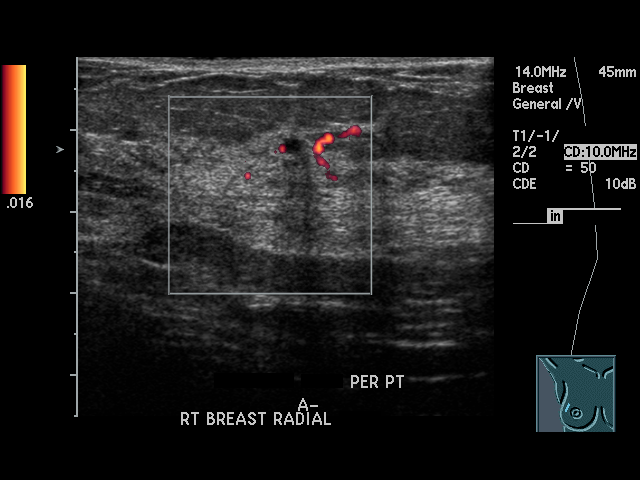
[im 9/33]
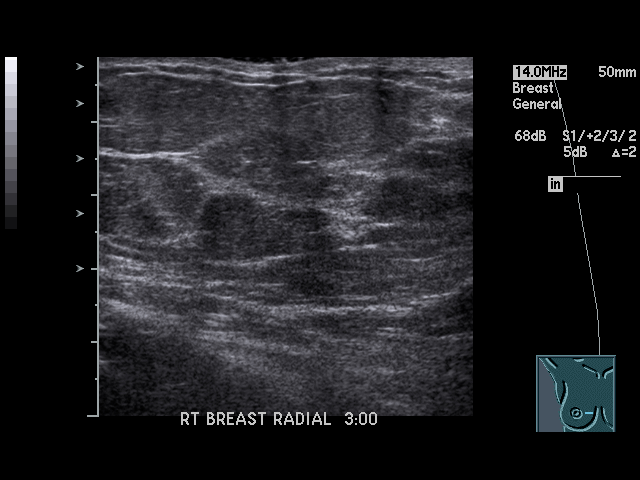
[im 11/33]
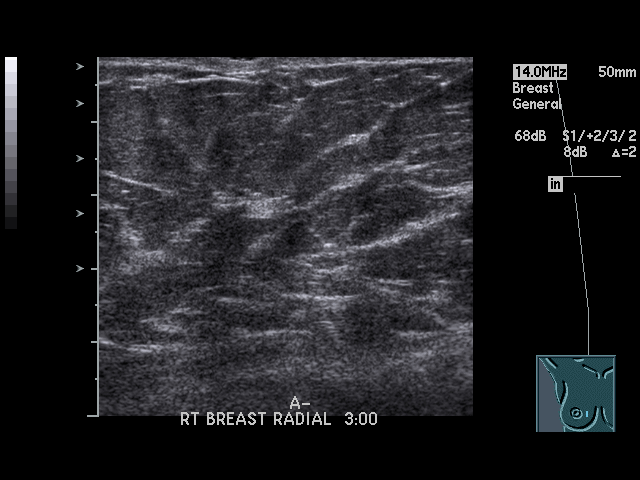
[im 13/33]
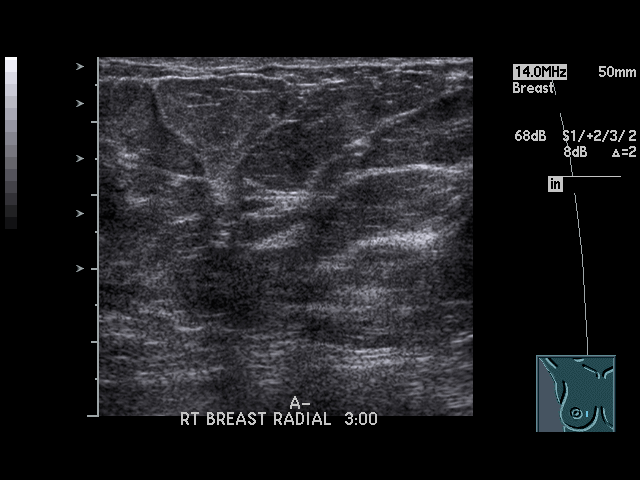
[im 15/33]
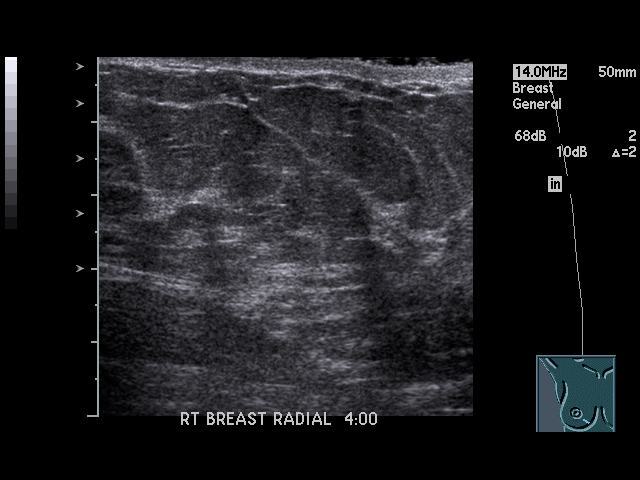
[im 17/33]
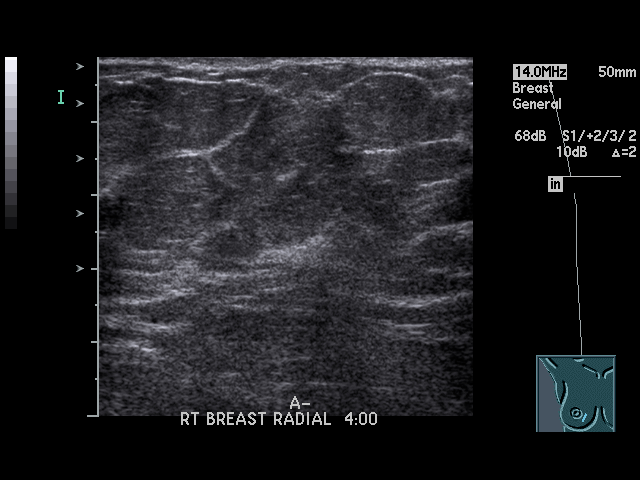
[im 18/33]
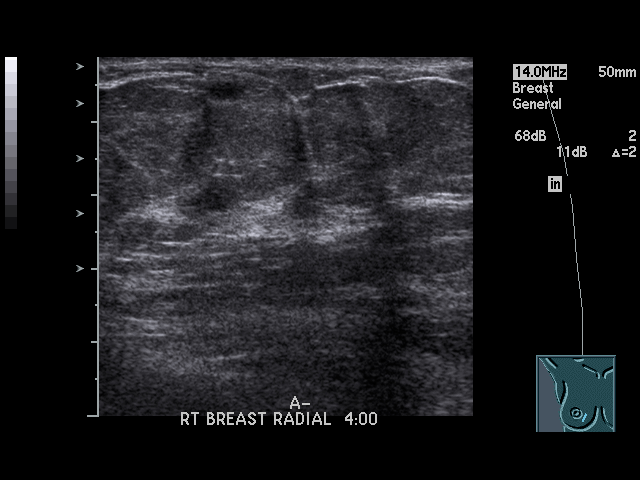
[im 21/33]
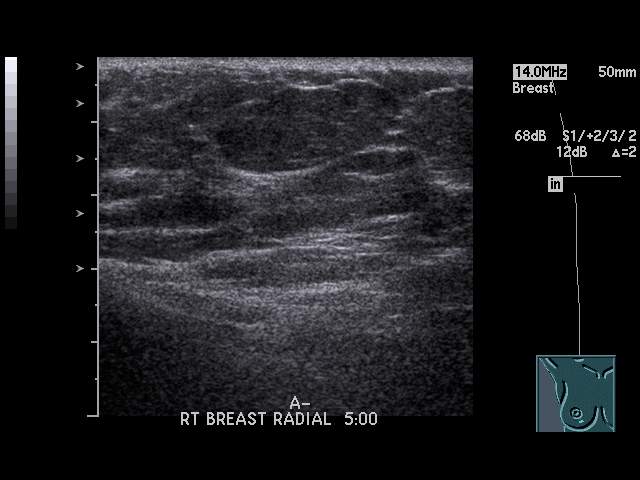
[im 22/33]
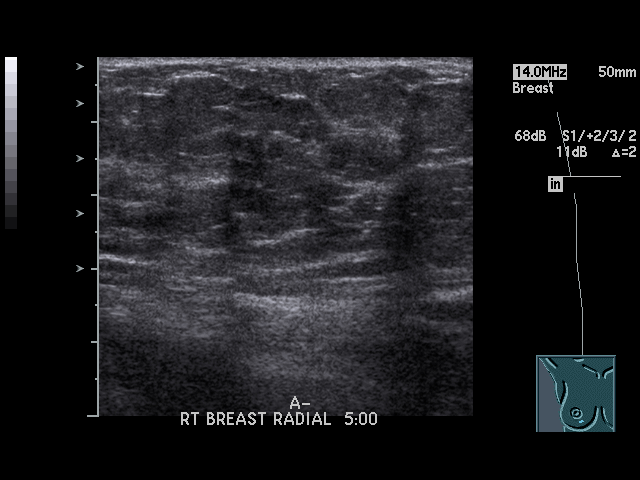
[im 25/33]
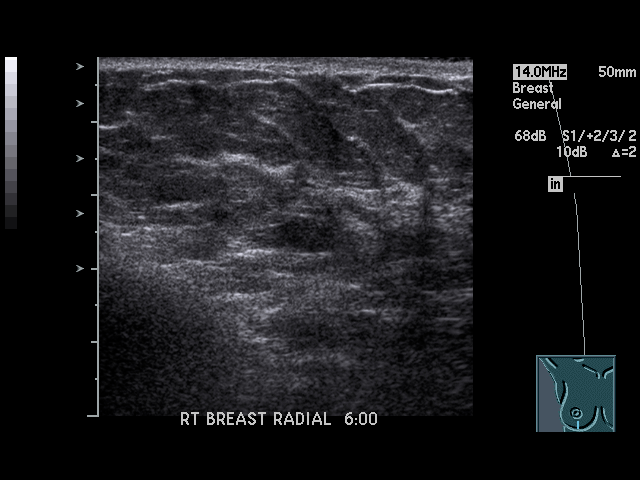
[im 26/33]
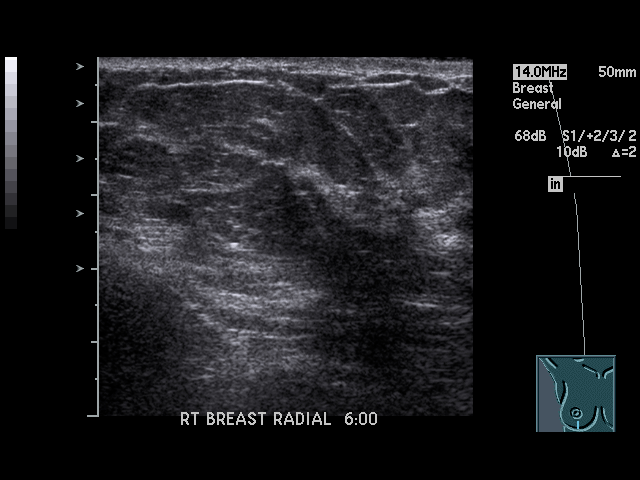
[im 29/33]
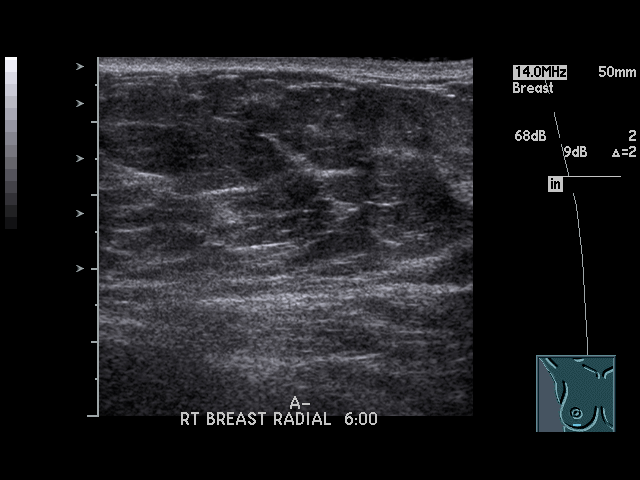
[im 30/33]
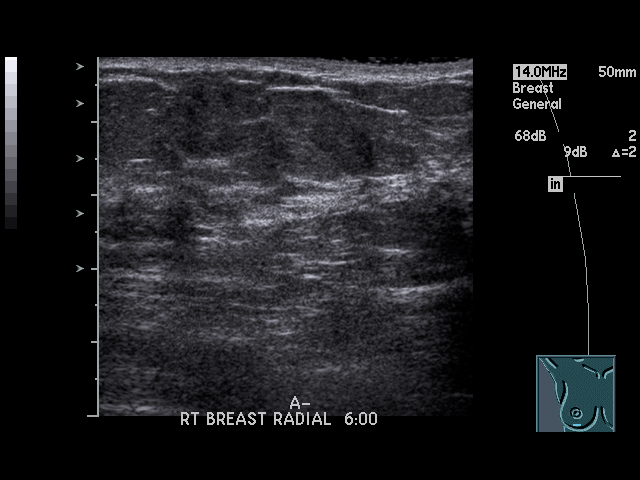
[im 33/33]
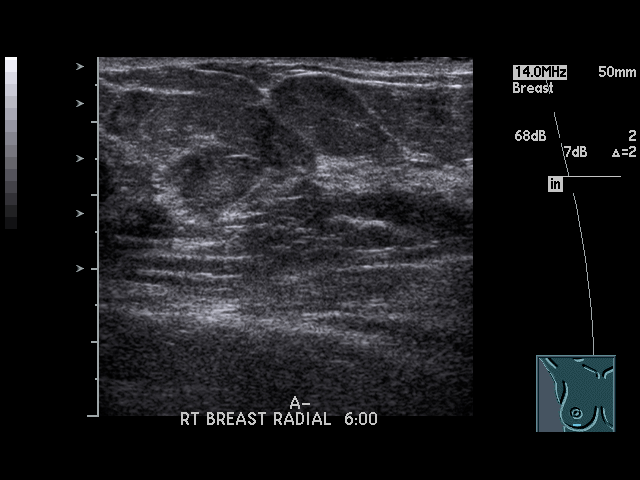

[17 of 25 positions shown; findings below may reference images not displayed]

PROCEDURE:     US  - US BREAST RIGHT  - July 22, 2006  [DATE]

RESULT:       The region of the palpable abnormality was further evaluated
with ultrasound evaluation.  Within the area of palpable abnormality, two
small hypoechoic nodules are appreciated.  The smaller more superficial
nodule measures 0.29 x 0.18 cm and demonstrates primarily smoothly
marginated borders with what appears to be an element of through
transmission.  The slightly larger deeper nodules measures 0.41 x 0.28 cm
and has sonographic characteristics which are concerning. There appears to
be acoustic shadowing associated with this nodule as well as irregularity of
the walls of this nodule.  No further solid or cystic sonographic
abnormalities are identified.
IMPRESSION: 1.  Concerning hypoechoic nodule at the 10 o'clock position in the region of
concern of the RIGHT breast.  Further evaluation with surgical consultation
with possibly an eye towards biopsy is recommended.  BIRADS:   Category
4-Suspicious Abnormality.
2.  What appears to be a small benign-appearing cyst satellite to the region
of concern.  This has sonographic findings suggesting: BI-RADS: Category
3-Probably Benign Finding (interval follow up).  This can be monitored if
and as clinically warranted.

## 2008-06-07 IMAGING — CR DG CHEST 2V
1 series · 2 of 2 positions shown · non-contrast
Comparison: none

REASON FOR EXAM: Hematemesis, chest pain
COMMENTS:

PROCEDURE:     DXR - DXR CHEST PA (OR AP) AND LATERAL  - September 27, 2007  [DATE]
RESULT:     The lungs are clear of infiltrates. The cardiovascular
structures are unremarkable.

[Series 1: view not recorded · 0.17mm/px · 2 of 2 slices shown]
[im 1/2]
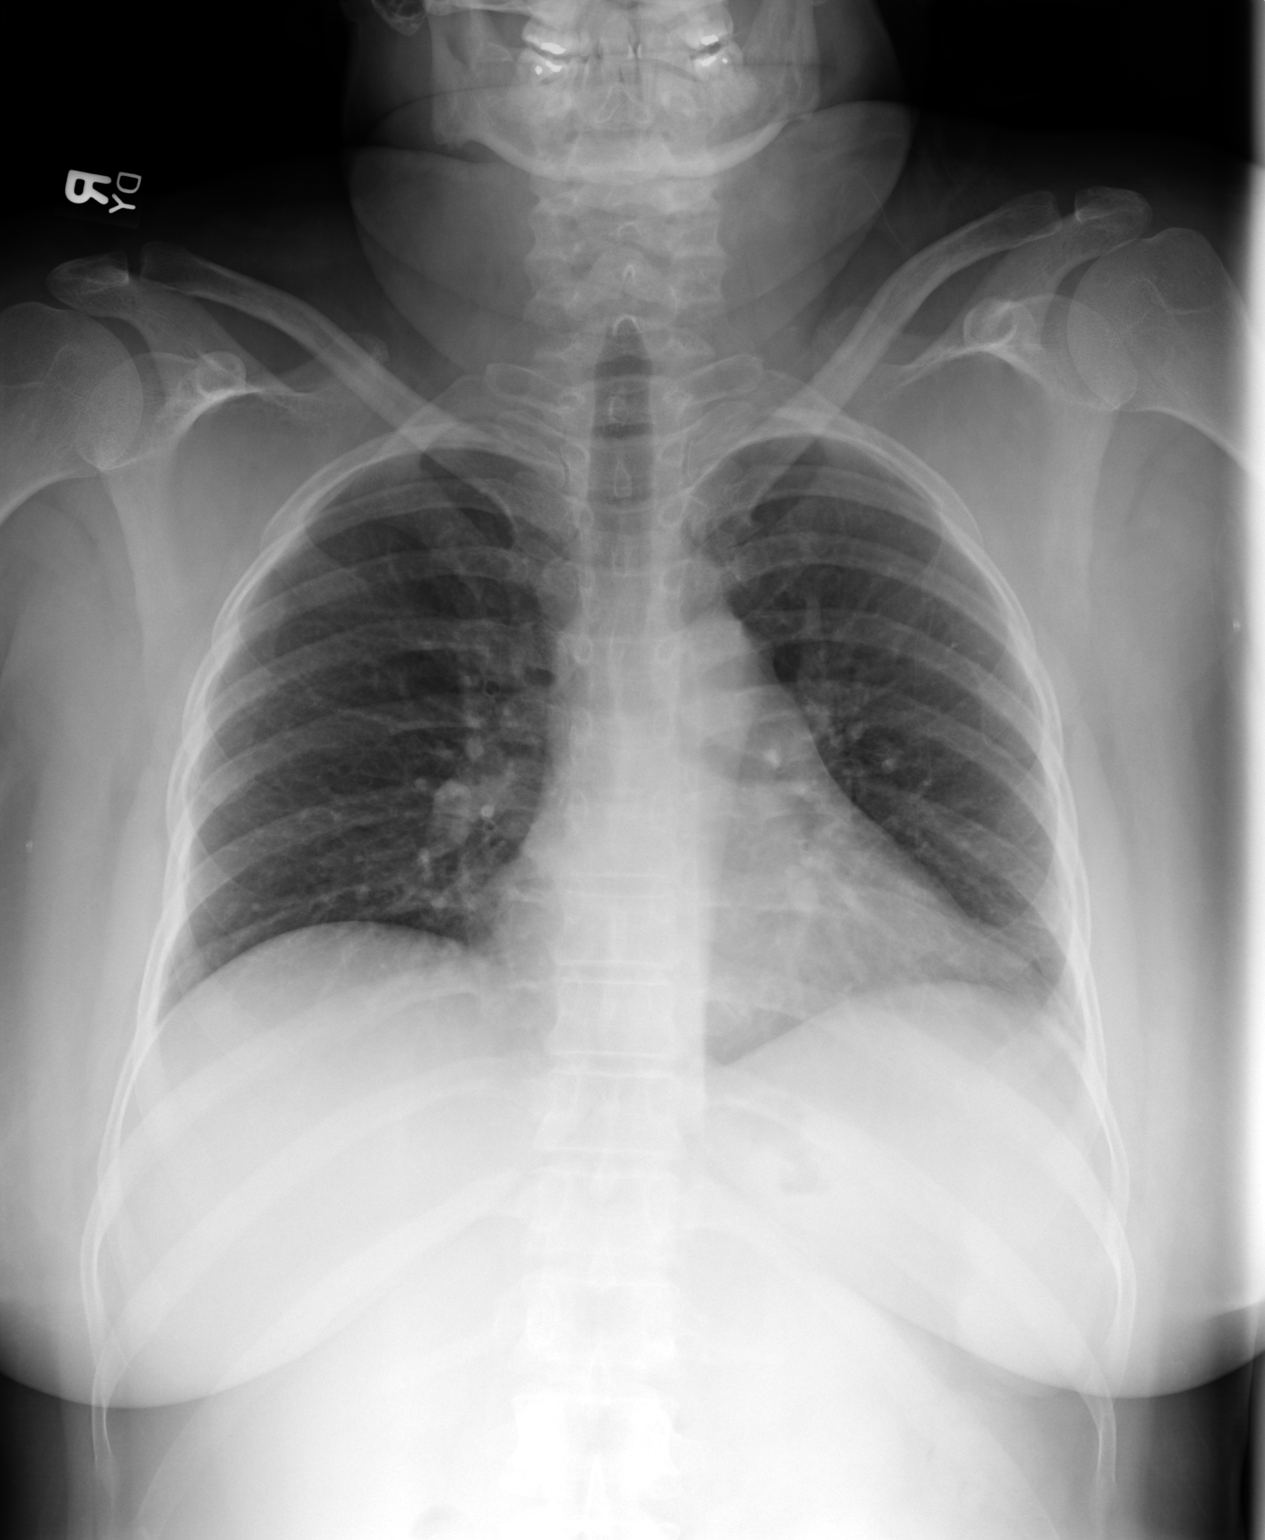
[im 2/2]
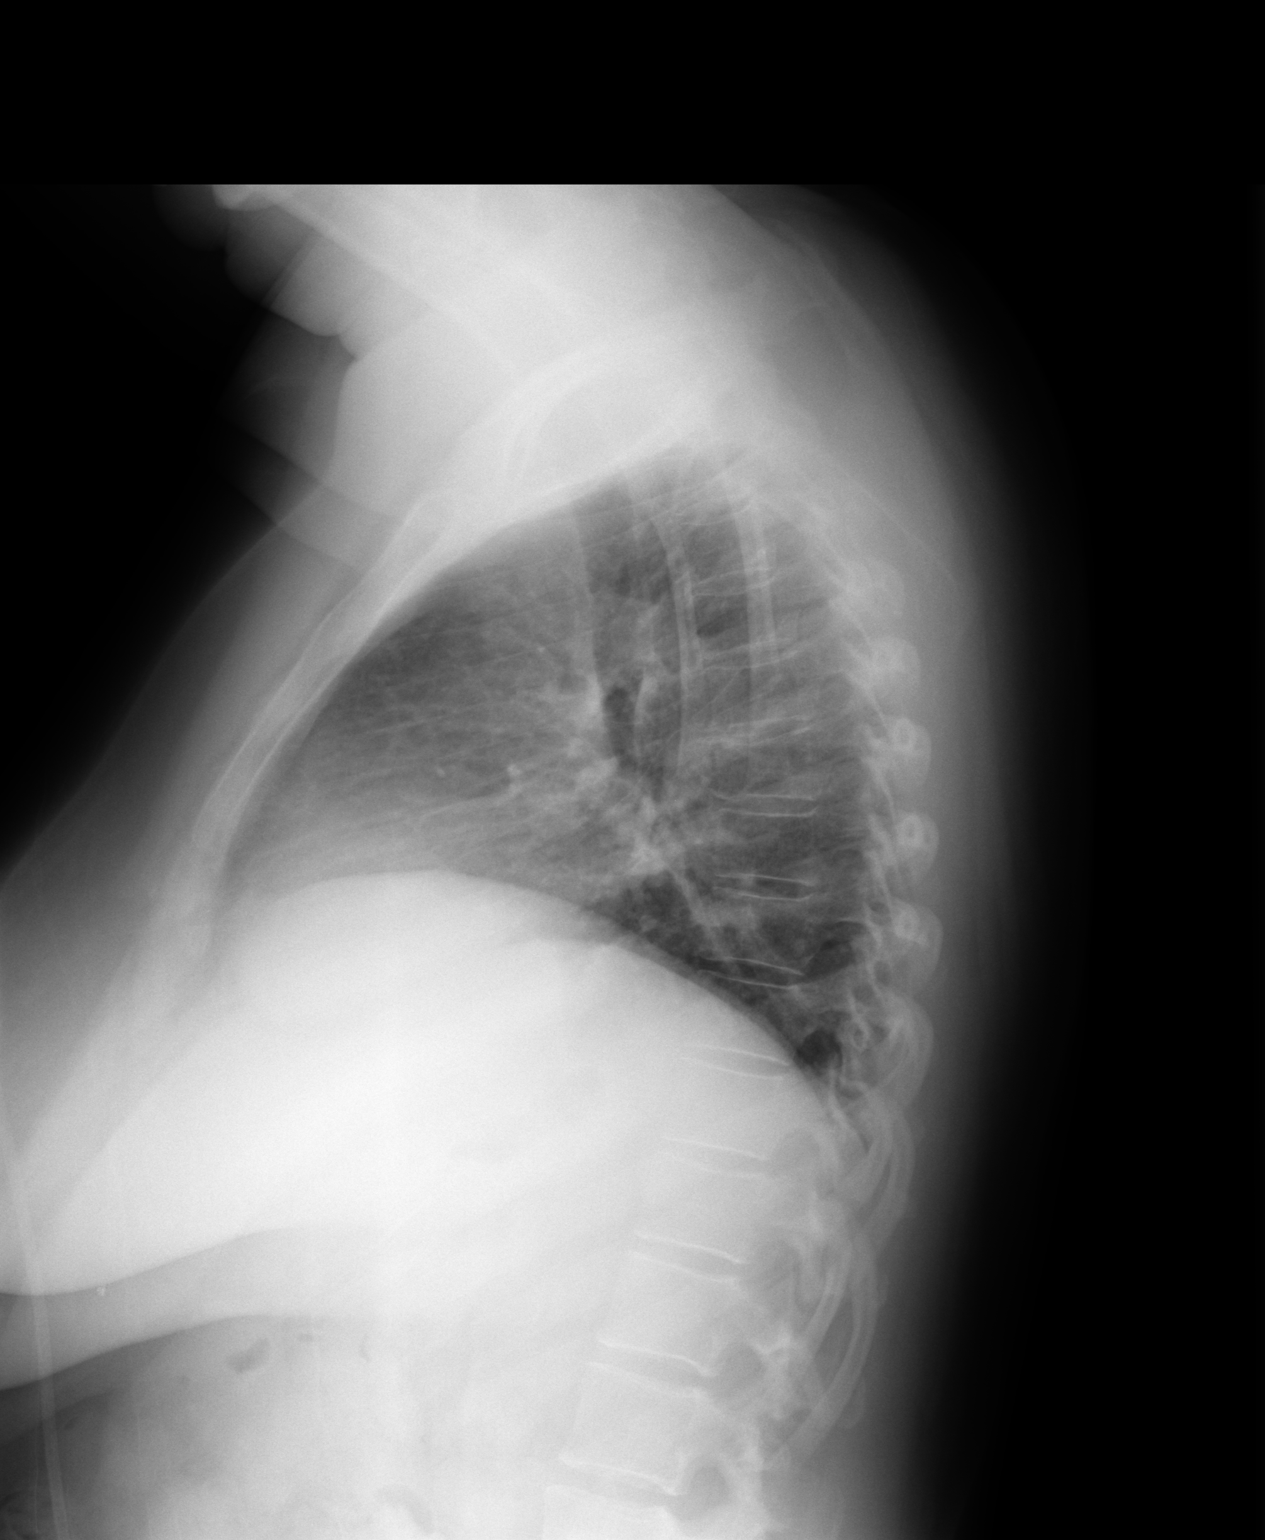

[2 of 2 positions shown; findings below may reference images not displayed]

IMPRESSION: No acute cardiopulmonary disease.

## 2008-06-26 ENCOUNTER — Emergency Department: Payer: Self-pay | Admitting: Emergency Medicine

## 2008-07-08 ENCOUNTER — Emergency Department: Payer: Self-pay | Admitting: Internal Medicine

## 2008-07-08 ENCOUNTER — Emergency Department: Payer: Self-pay | Admitting: Emergency Medicine

## 2008-07-28 ENCOUNTER — Ambulatory Visit: Payer: Self-pay | Admitting: Internal Medicine

## 2008-07-29 ENCOUNTER — Emergency Department: Payer: Self-pay | Admitting: Emergency Medicine

## 2008-08-03 ENCOUNTER — Emergency Department: Payer: Self-pay | Admitting: Emergency Medicine

## 2008-08-03 IMAGING — CR DG SHOULDER 3+V*L*
1 series · 3 of 3 positions shown · non-contrast
Comparison: None

REASON FOR EXAM: fell, pain
COMMENTS:

PROCEDURE:     MDR - MDR SHOULDER LEFT COMPLETE  - November 23, 2007 [DATE]
RESULT:     History: 37-year-old female status post fall

[Series 1: view not recorded · 0.17mm/px · 3 of 3 slices shown]
[im 1/3]
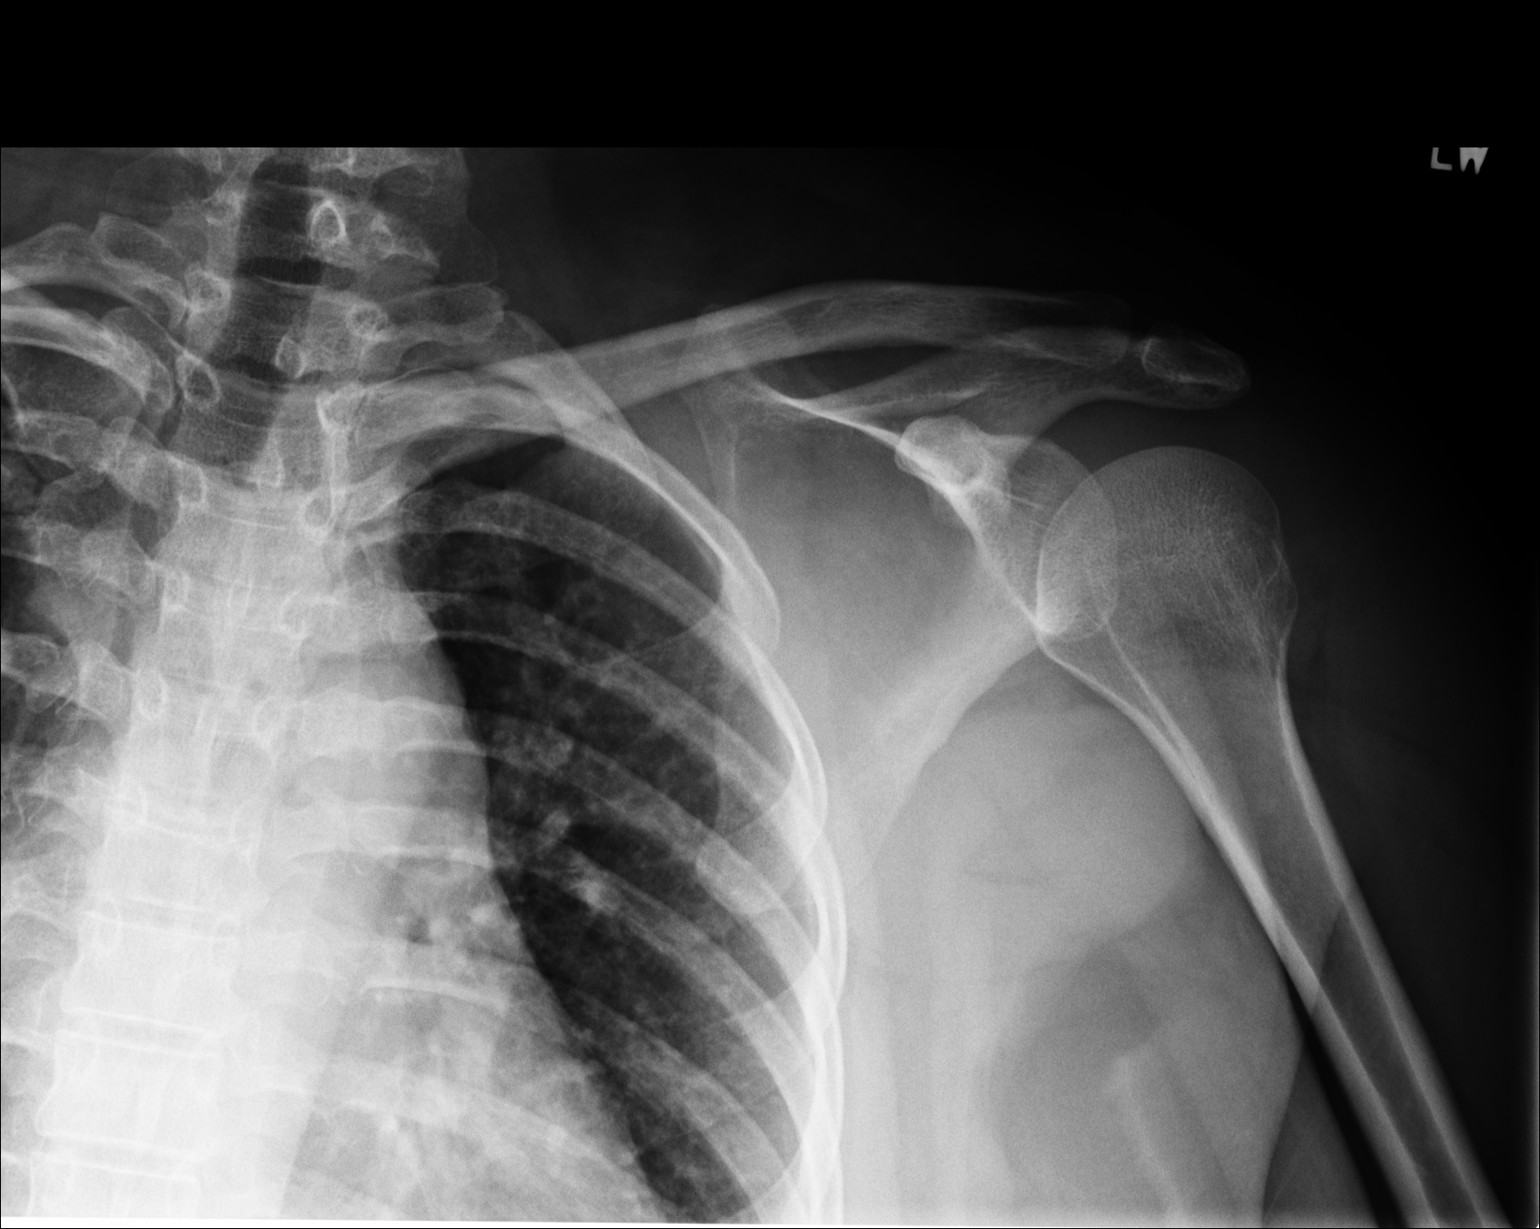
[im 2/3]
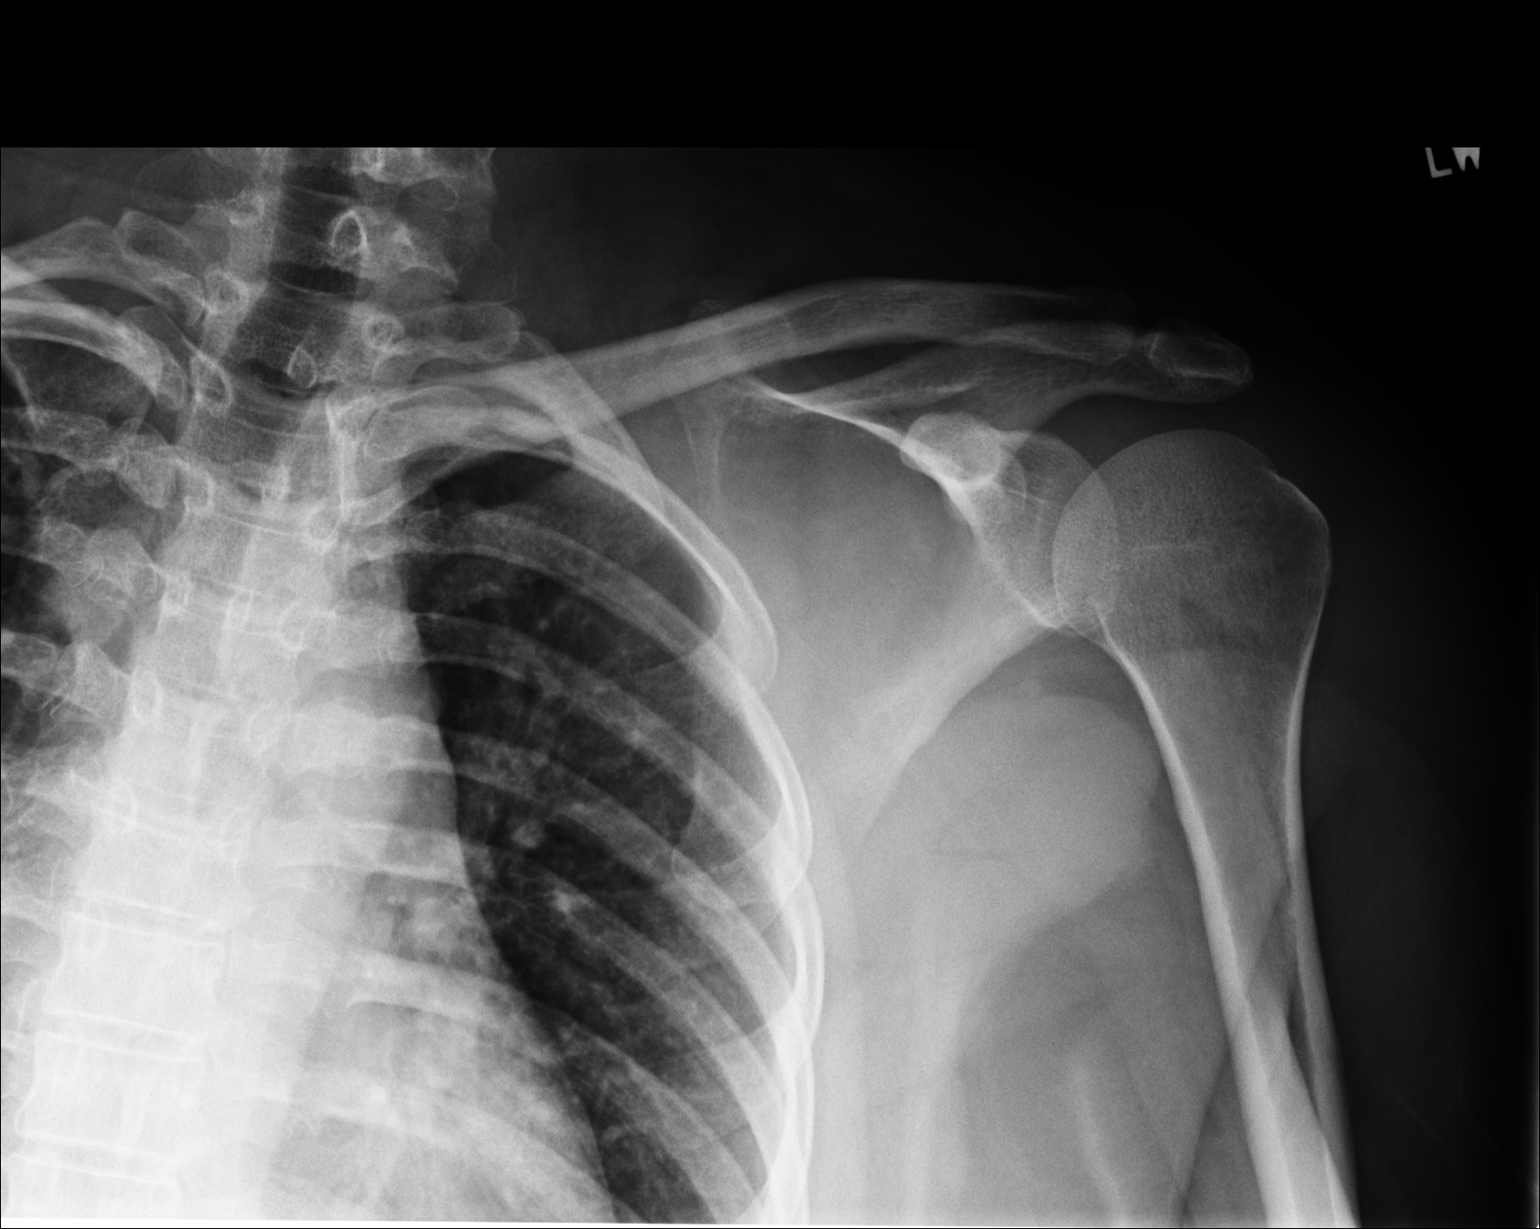
[im 3/3]
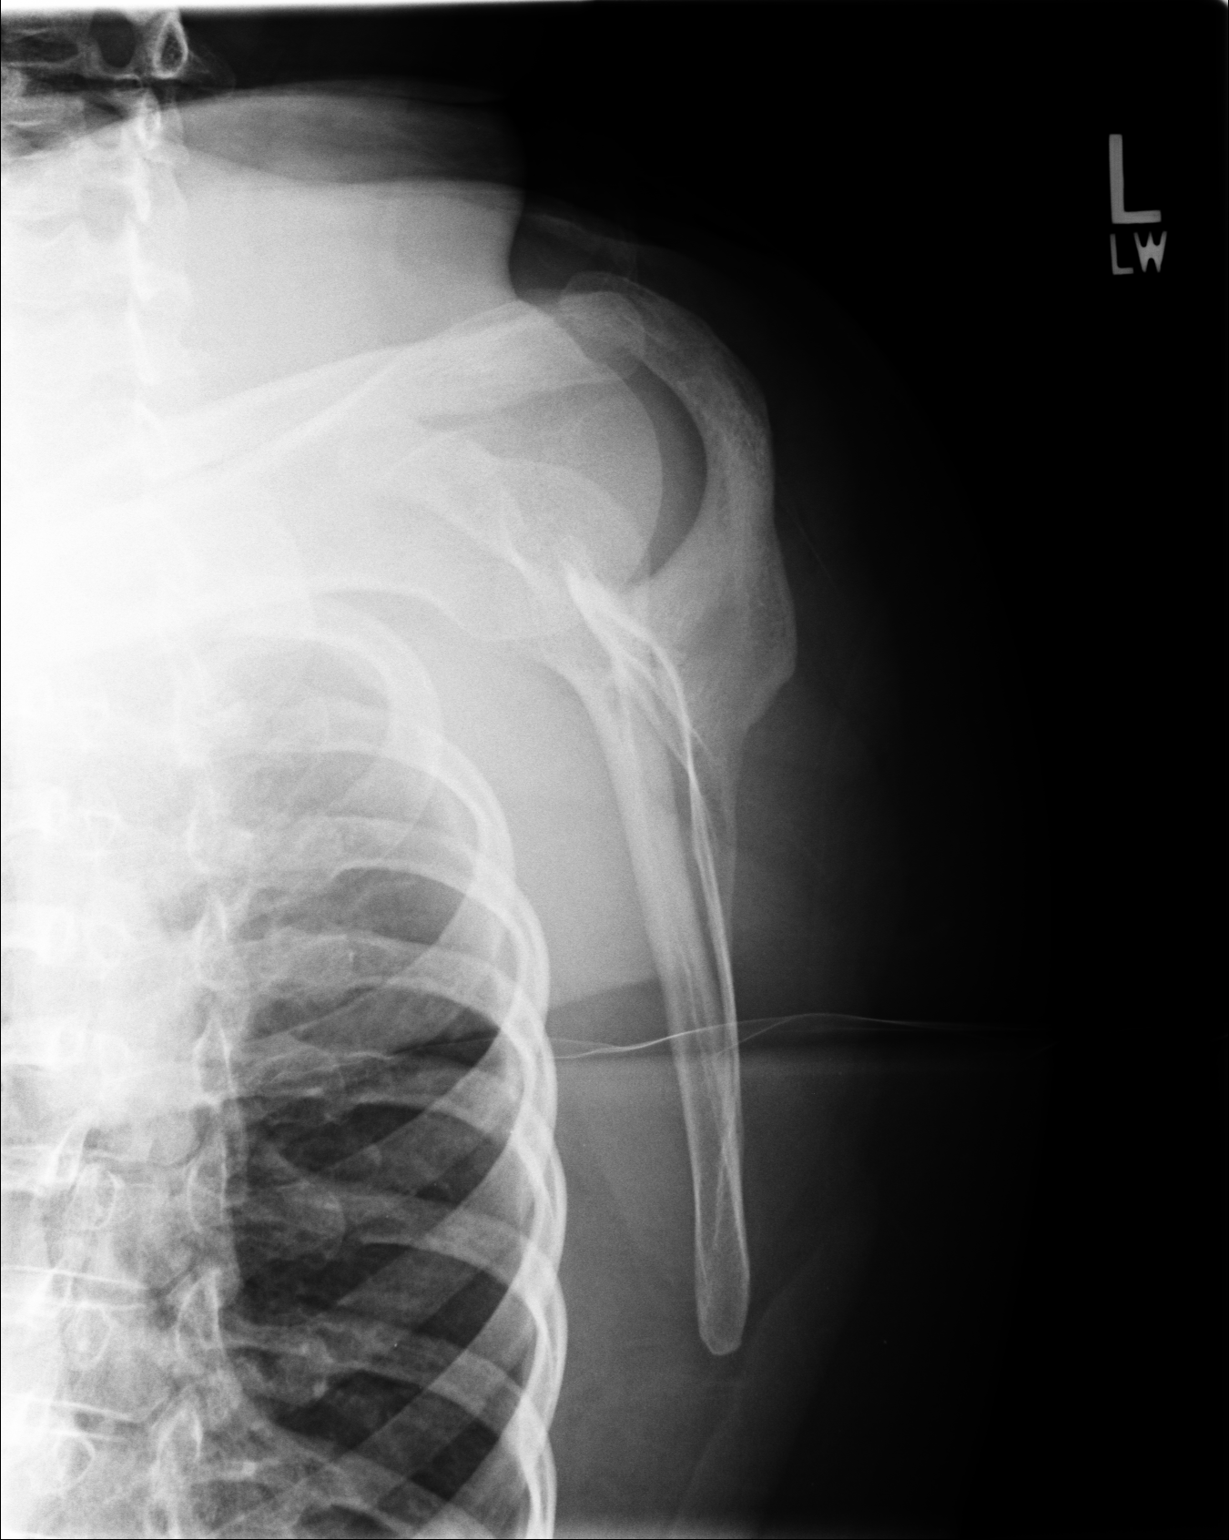

[3 of 3 positions shown; findings below may reference images not displayed]

FINDINGS: 3 views of the left shoulder demonstrates no fracture or dislocation. The
acromioclavicular joint is normal. The glenohumeral joint is normal.
IMPRESSION: No acute osseous injury of the left shoulder.

## 2008-08-22 ENCOUNTER — Emergency Department: Payer: Self-pay | Admitting: Emergency Medicine

## 2008-08-25 ENCOUNTER — Ambulatory Visit: Payer: Self-pay | Admitting: Family Medicine

## 2008-08-30 ENCOUNTER — Ambulatory Visit: Payer: Self-pay | Admitting: Internal Medicine

## 2008-09-10 ENCOUNTER — Emergency Department: Payer: Self-pay | Admitting: Emergency Medicine

## 2008-09-11 IMAGING — CR RIGHT ANKLE - COMPLETE 3+ VIEW
1 series · 5 of 5 positions shown · non-contrast
Comparison: none

REASON FOR EXAM: pain, swelling
COMMENTS:

PROCEDURE:     MDR - MDR ANKLE RIGHT COMPLETE  - November 07, 2006  [DATE]
RESULT:     Five views show no fracture, dislocation or other acute bony
abnormality.  The ankle mortise is well maintained.

[Series 1: view not recorded · 0.17mm/px · 5 of 5 slices shown]
[im 1/5]
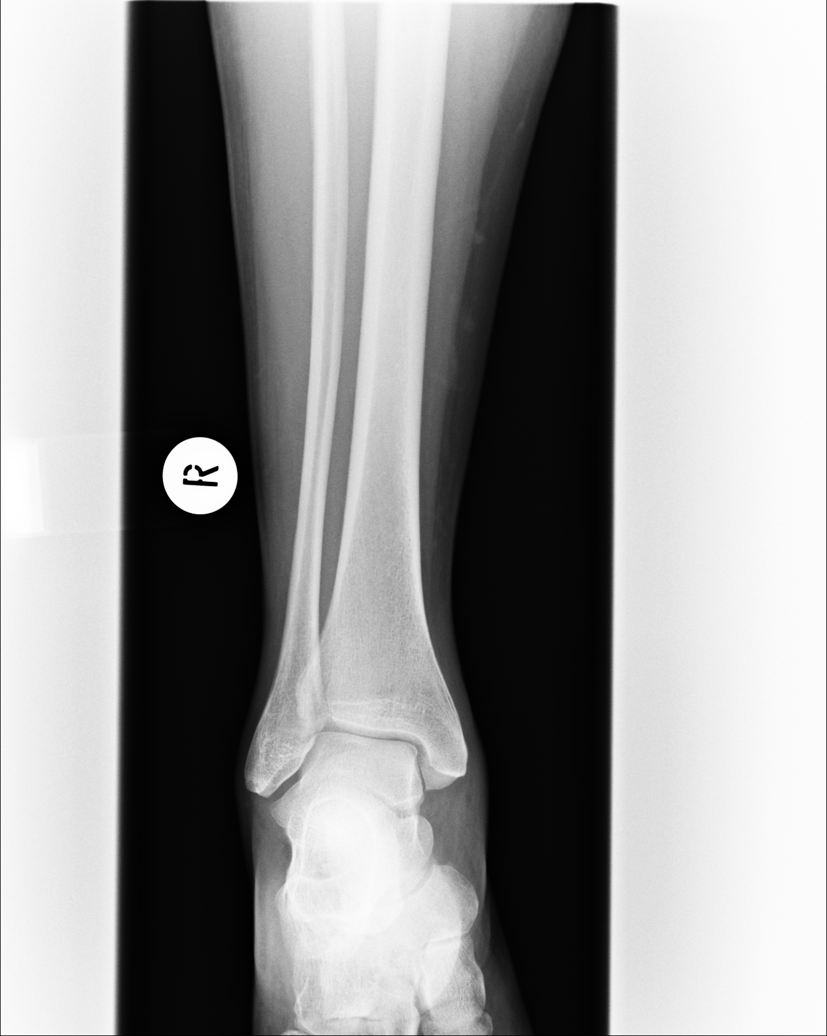
[im 2/5]
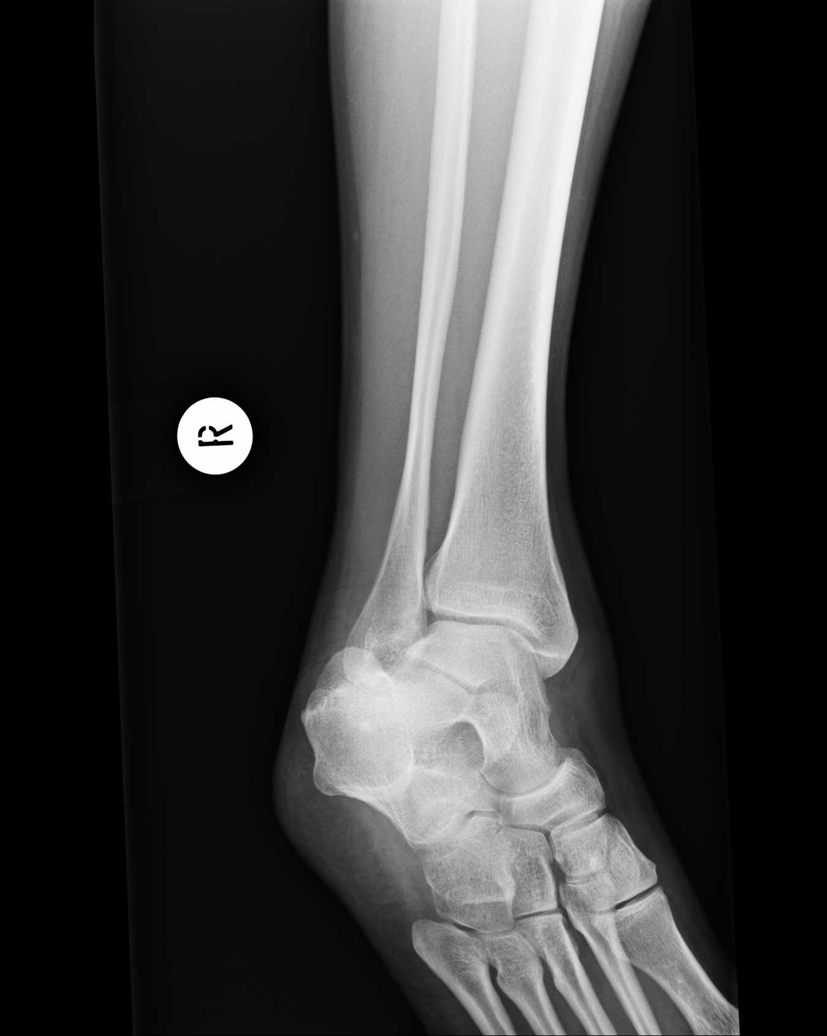
[im 3/5]
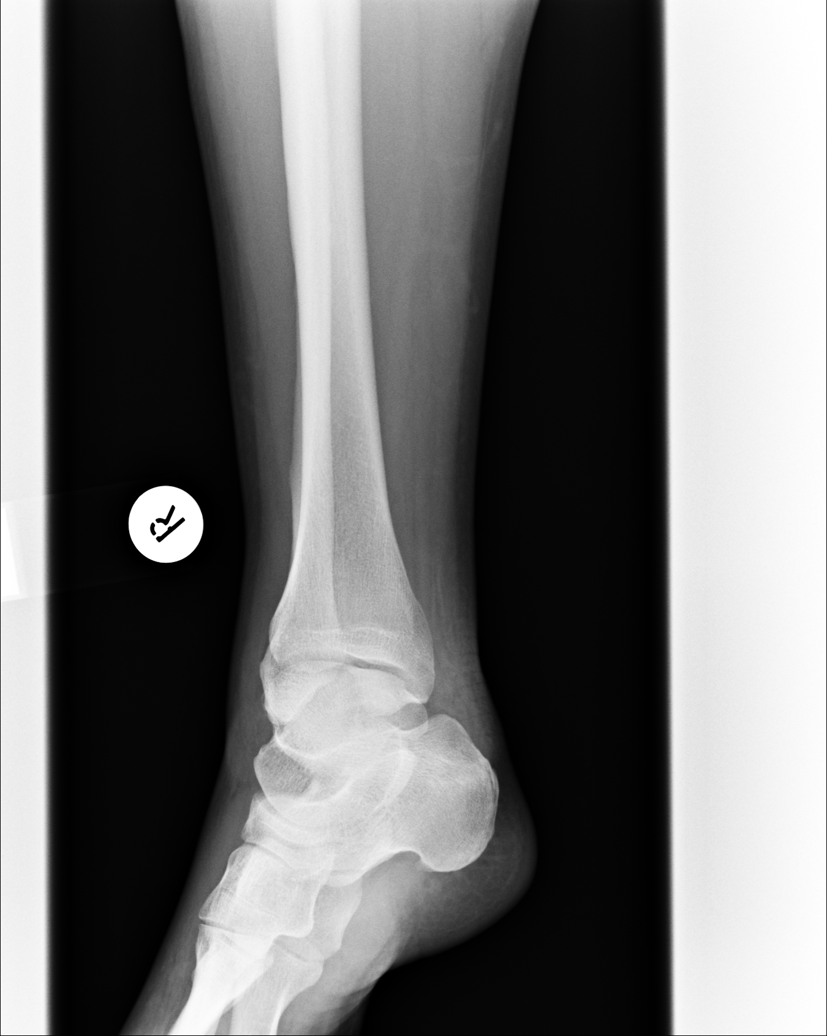
[im 4/5]
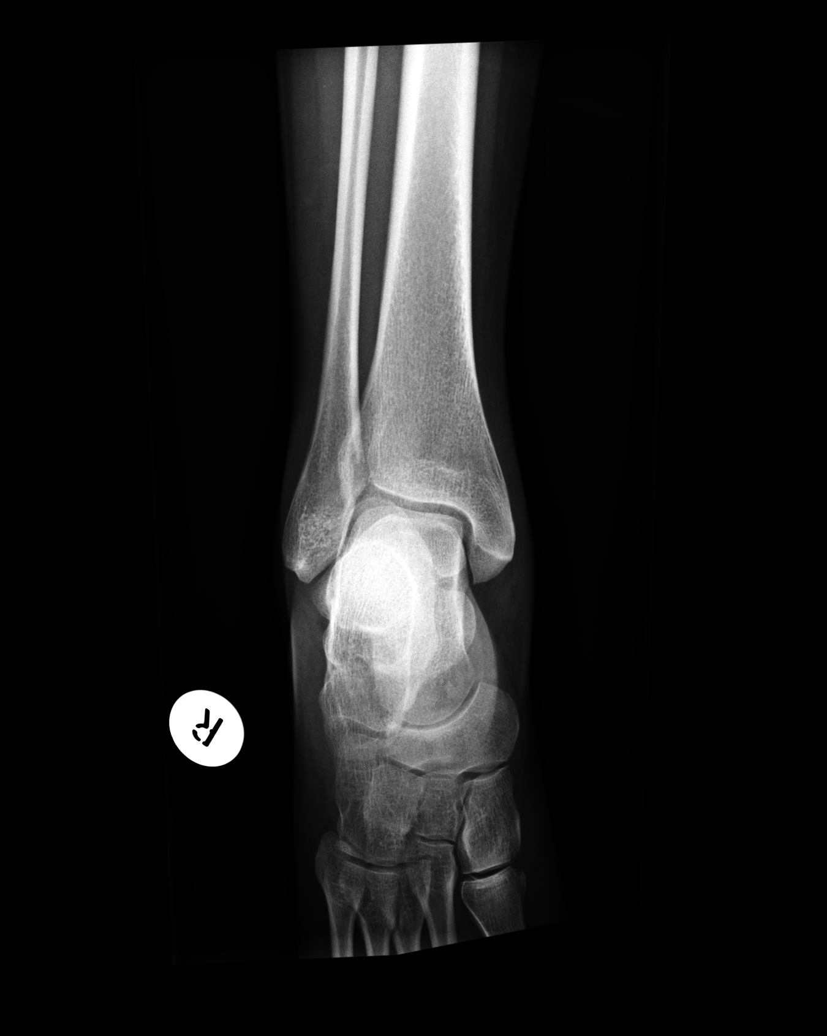
[im 5/5]
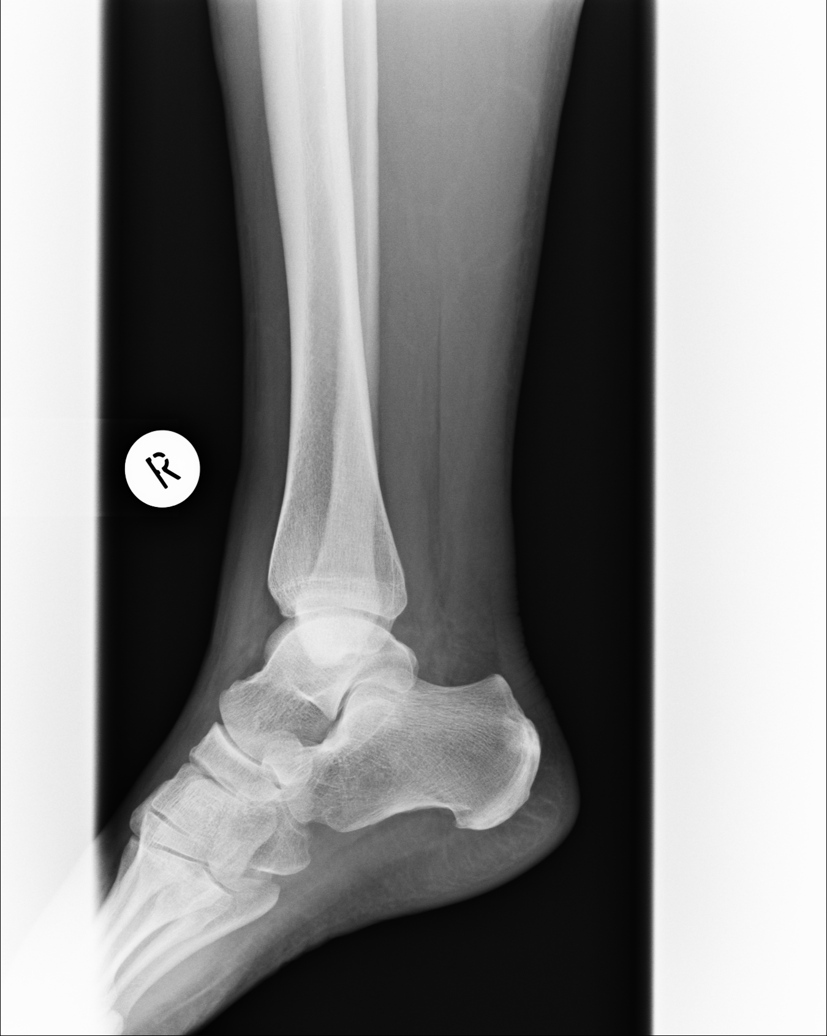

[5 of 5 positions shown; findings below may reference images not displayed]

IMPRESSION: No acute changes are identified.

## 2008-10-02 IMAGING — US ABDOMEN ULTRASOUND
1 series · 17 of 25 positions shown · non-contrast
Comparison: none

REASON FOR EXAM: abnormal liver function test
COMMENTS:

[Series 1: abdomen ultrasound · 17 of 64 slices shown]
[im 1/64]
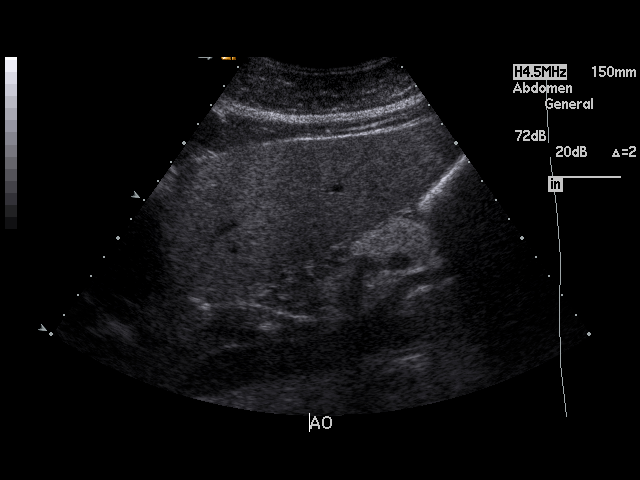
[im 6/64]
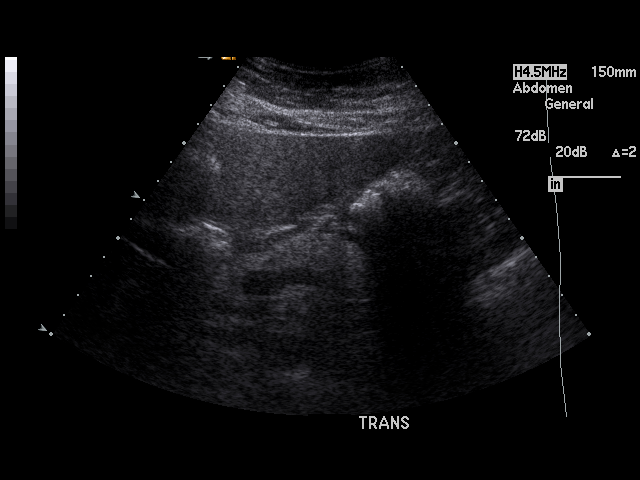
[im 8/64]
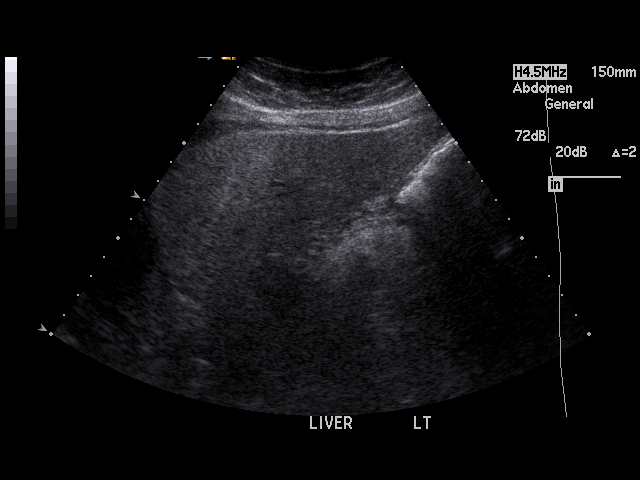
[im 14/64]
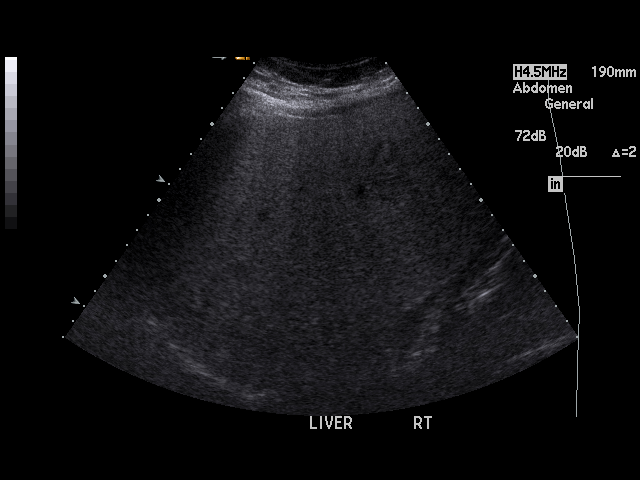
[im 16/64]
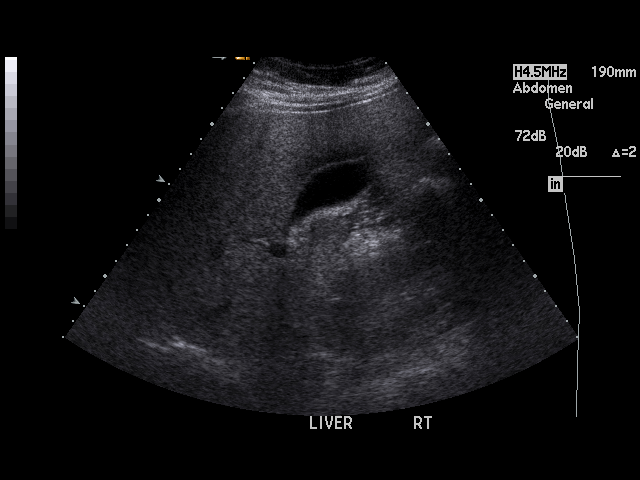
[im 22/64]
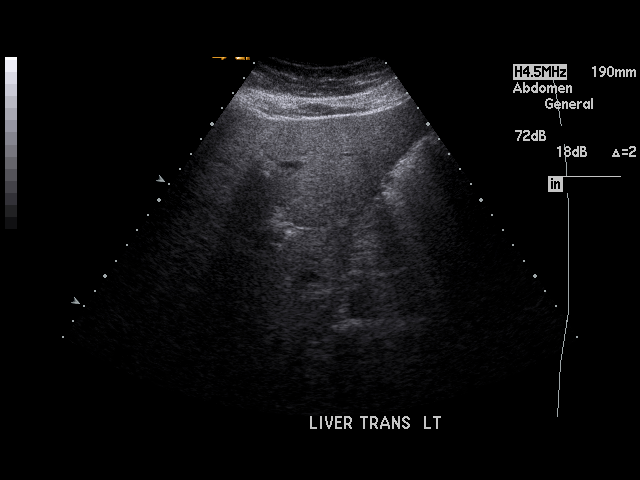
[im 24/64]
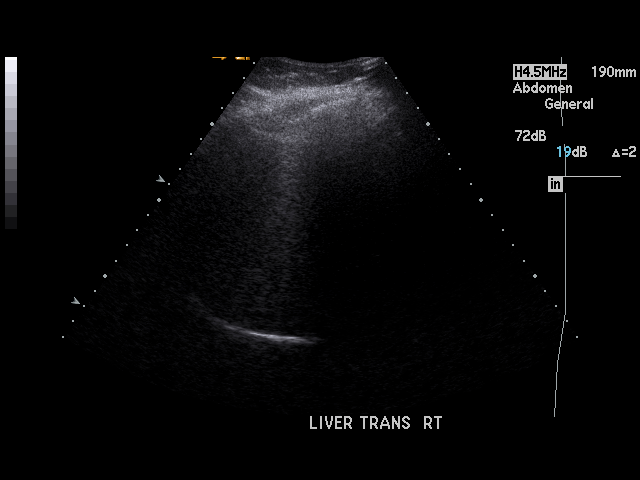
[im 29/64]
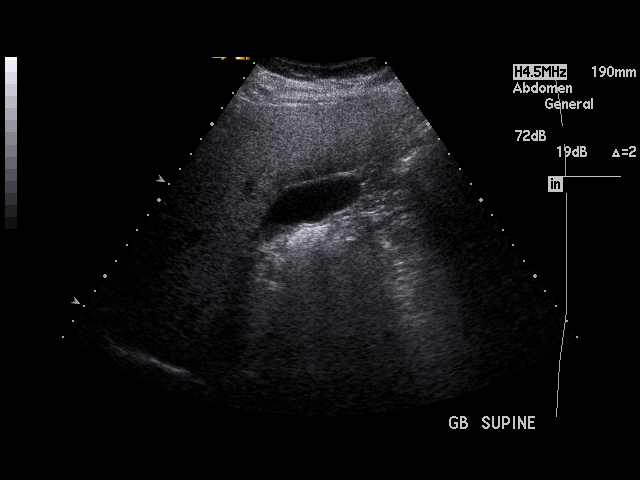
[im 32/64]
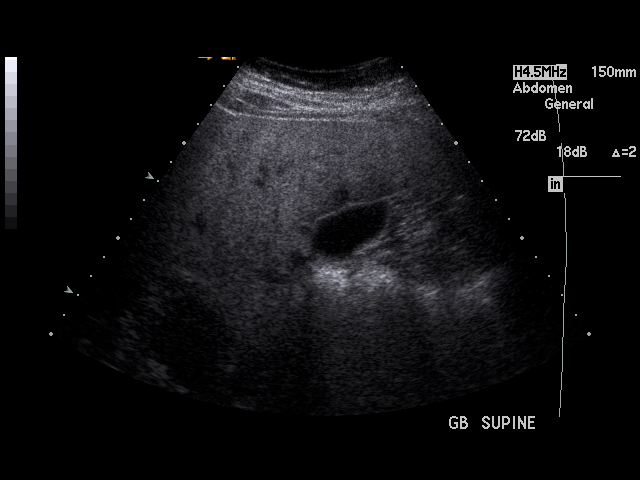
[im 35/64]
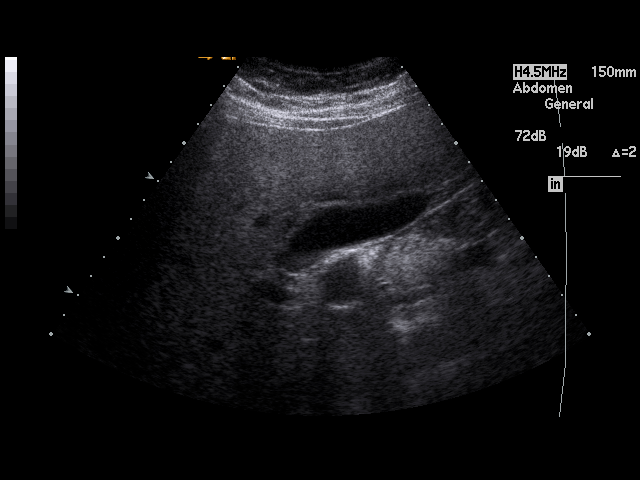
[im 40/64]
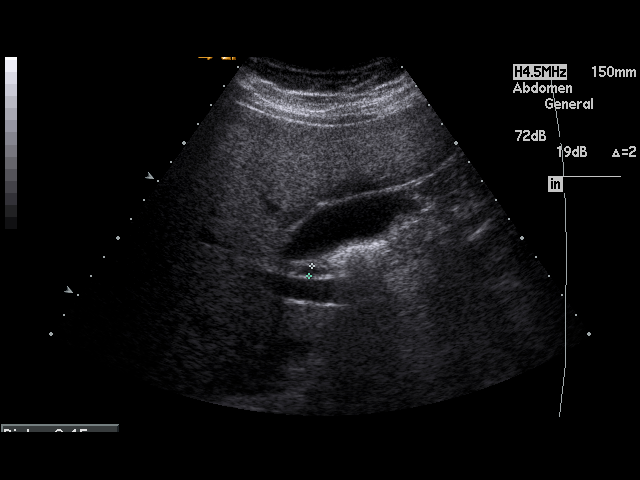
[im 43/64]
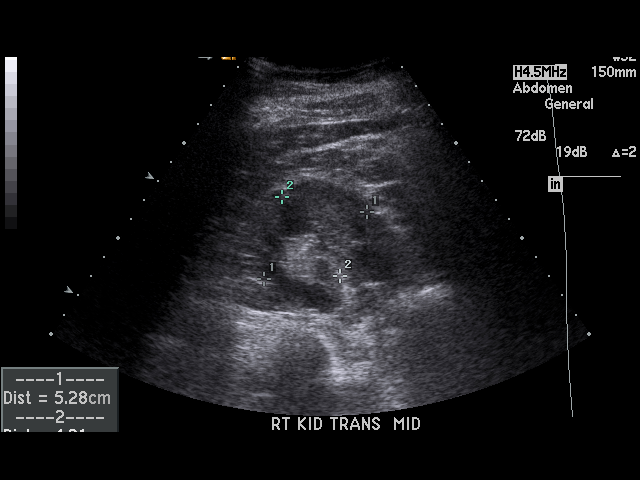
[im 48/64]
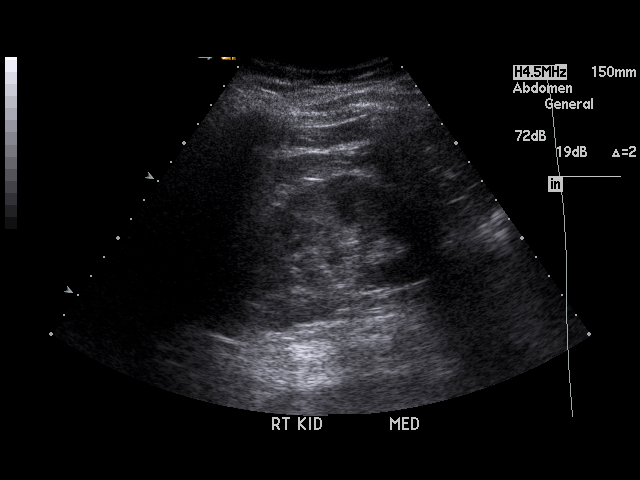
[im 50/64]
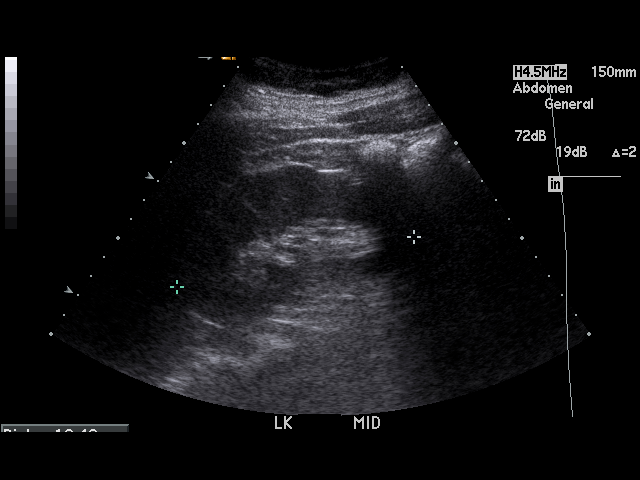
[im 56/64]
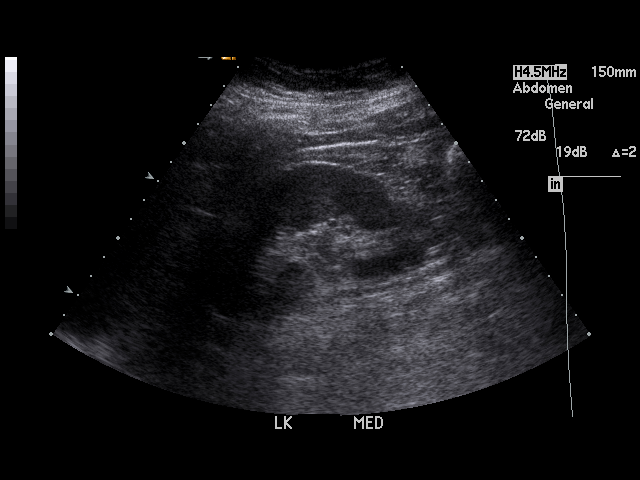
[im 58/64]
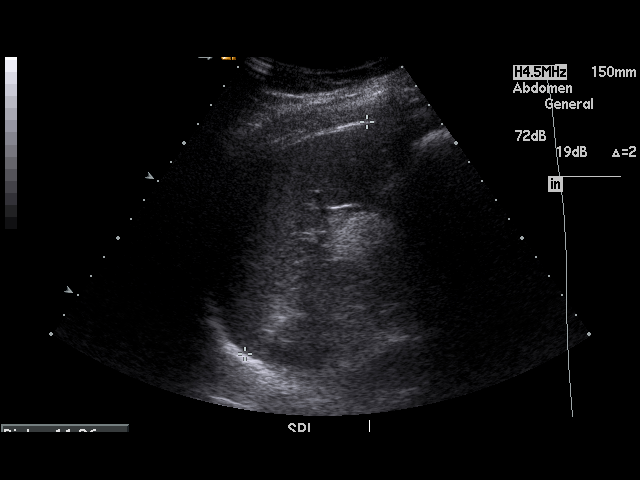
[im 64/64]
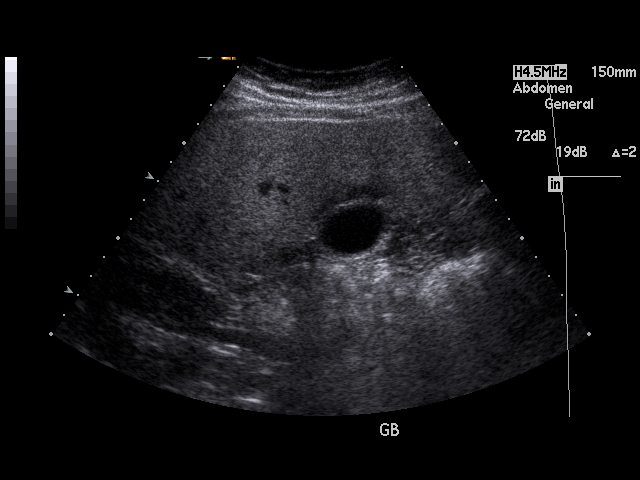

[17 of 25 positions shown; findings below may reference images not displayed]

PROCEDURE:     US  - US ABDOMEN GENERAL SURVEY  - January 22, 2008  [DATE]

RESULT:     The liver appears dense, suspicious for fatty infiltration. This
could be better evaluated by CT if clinically indicated. No definite focal
hepatic mass lesions are identified. The spleen is mildly enlarged. Spleen
measures 13.78 cm at maximum AP diameter. The abdominal aorta and inferior
vena cava show no significant abnormalities. No gallstones are seen. There
is no thickening of the gallbladder wall.  The common bile duct measures
mm in diameter. The kidneys show no hydronephrosis. There is no ascites.
IMPRESSION: 1. Probable fatty infiltration of the liver.
2. No gallstones or other acute change is identified.
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
## 2008-10-08 ENCOUNTER — Ambulatory Visit: Payer: Self-pay | Admitting: Obstetrics & Gynecology

## 2008-10-12 ENCOUNTER — Ambulatory Visit: Payer: Self-pay | Admitting: Internal Medicine

## 2008-10-12 IMAGING — CR DG CHEST 2V
1 series · 2 of 2 positions shown · non-contrast
Comparison: none

REASON FOR EXAM: cough
COMMENTS:

PROCEDURE:     MDR - MDR CHEST PA(OR AP) AND LATERAL  - February 01, 2008 [DATE]
RESULT:     No acute cardiopulmonary disease identified. Mild basilar
atelectasis is present.

[Series 1: view not recorded · 0.17mm/px · 2 of 2 slices shown]
[im 1/2]
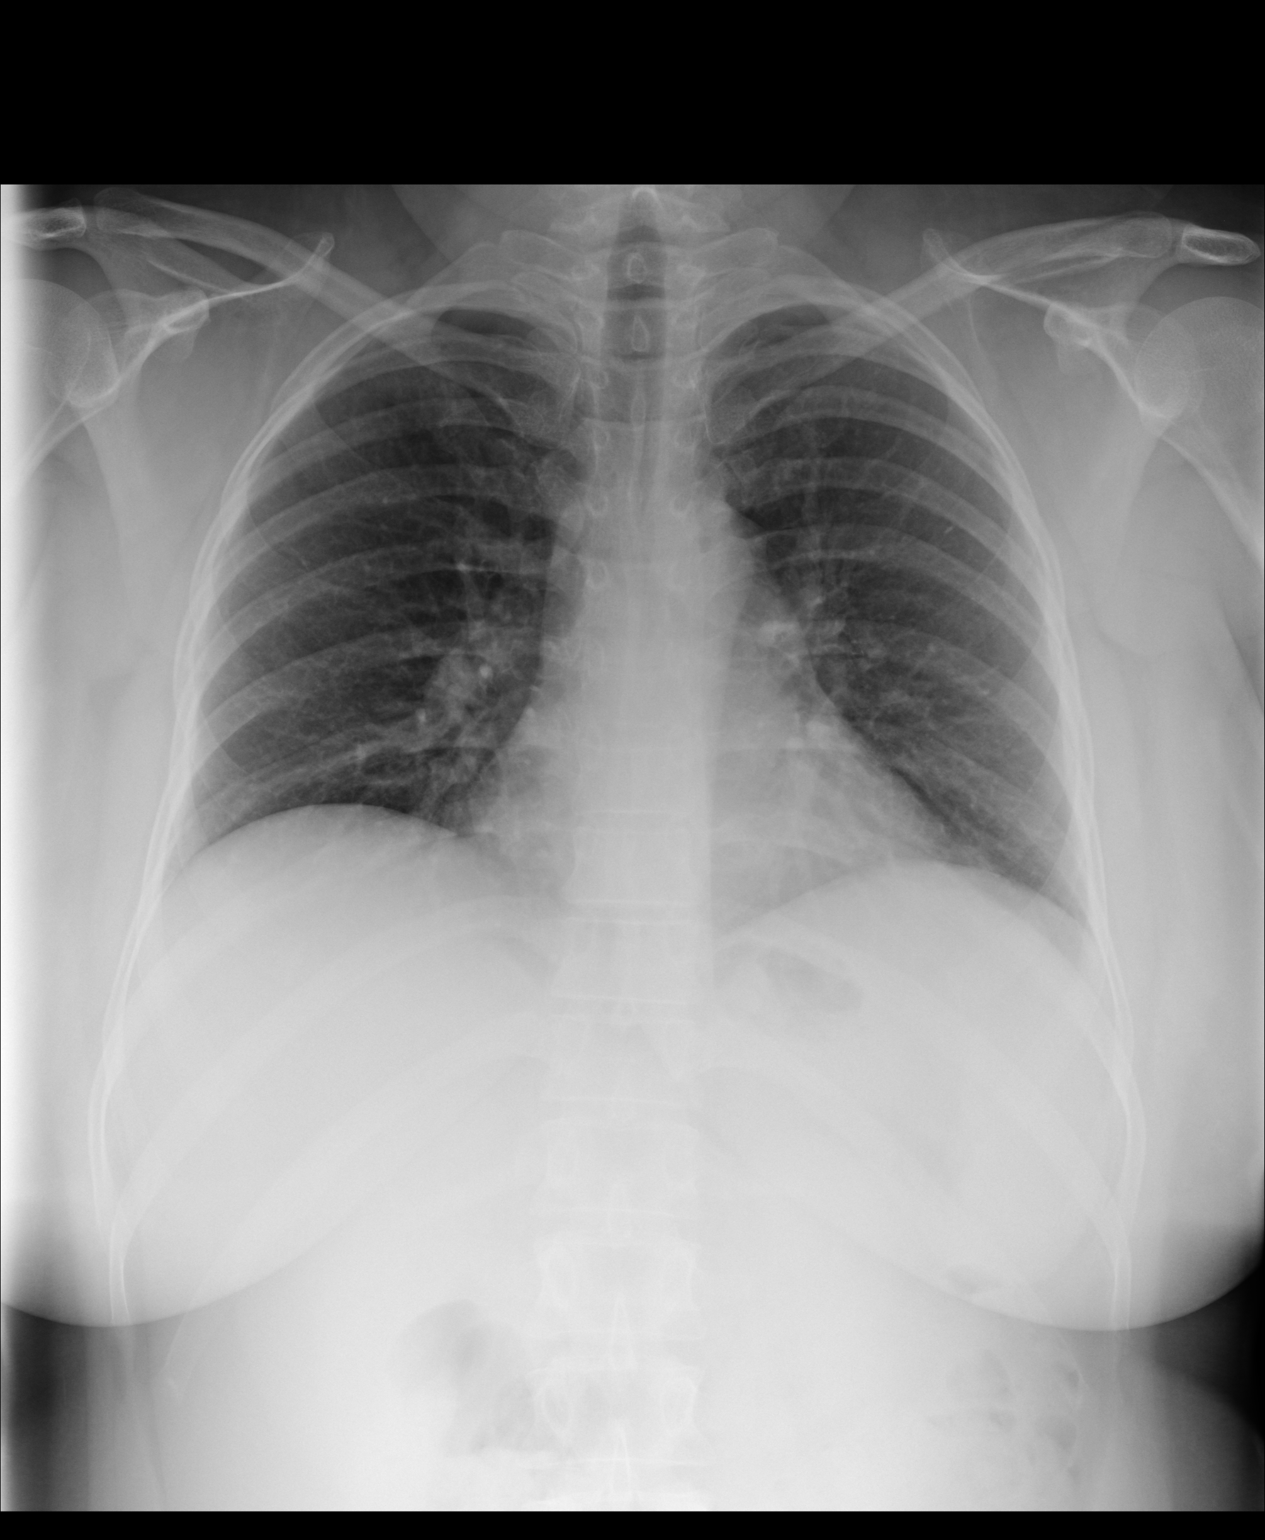
[im 2/2]
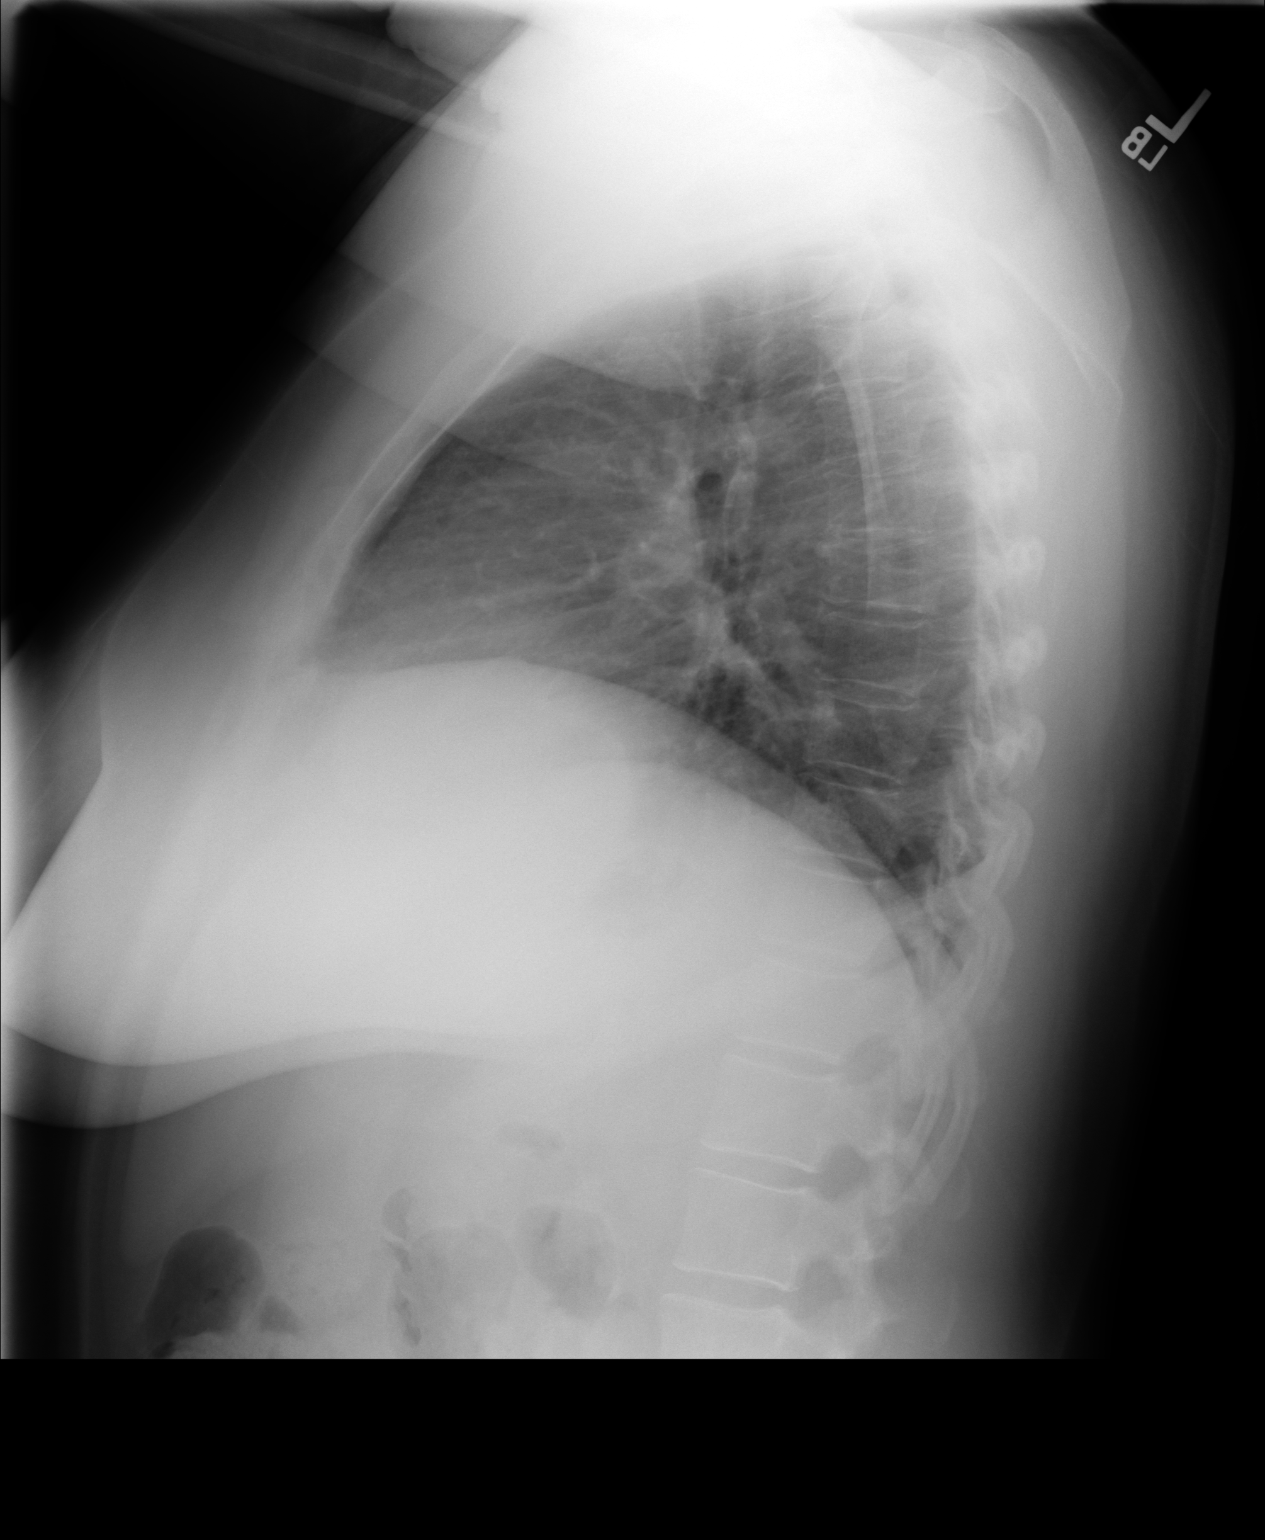

[2 of 2 positions shown; findings below may reference images not displayed]

IMPRESSION: 1.Mild basilar atelectasis. No acute cardiopulmonary disease.

## 2008-10-19 ENCOUNTER — Ambulatory Visit: Payer: Self-pay | Admitting: Internal Medicine

## 2008-10-29 IMAGING — CR DG CHEST 2V
1 series · 2 of 2 positions shown · non-contrast
Comparison: none

REASON FOR EXAM: chest pain
COMMENTS:

[Series 1: view not recorded · 0.17mm/px · 2 of 2 slices shown]
[im 1/2]
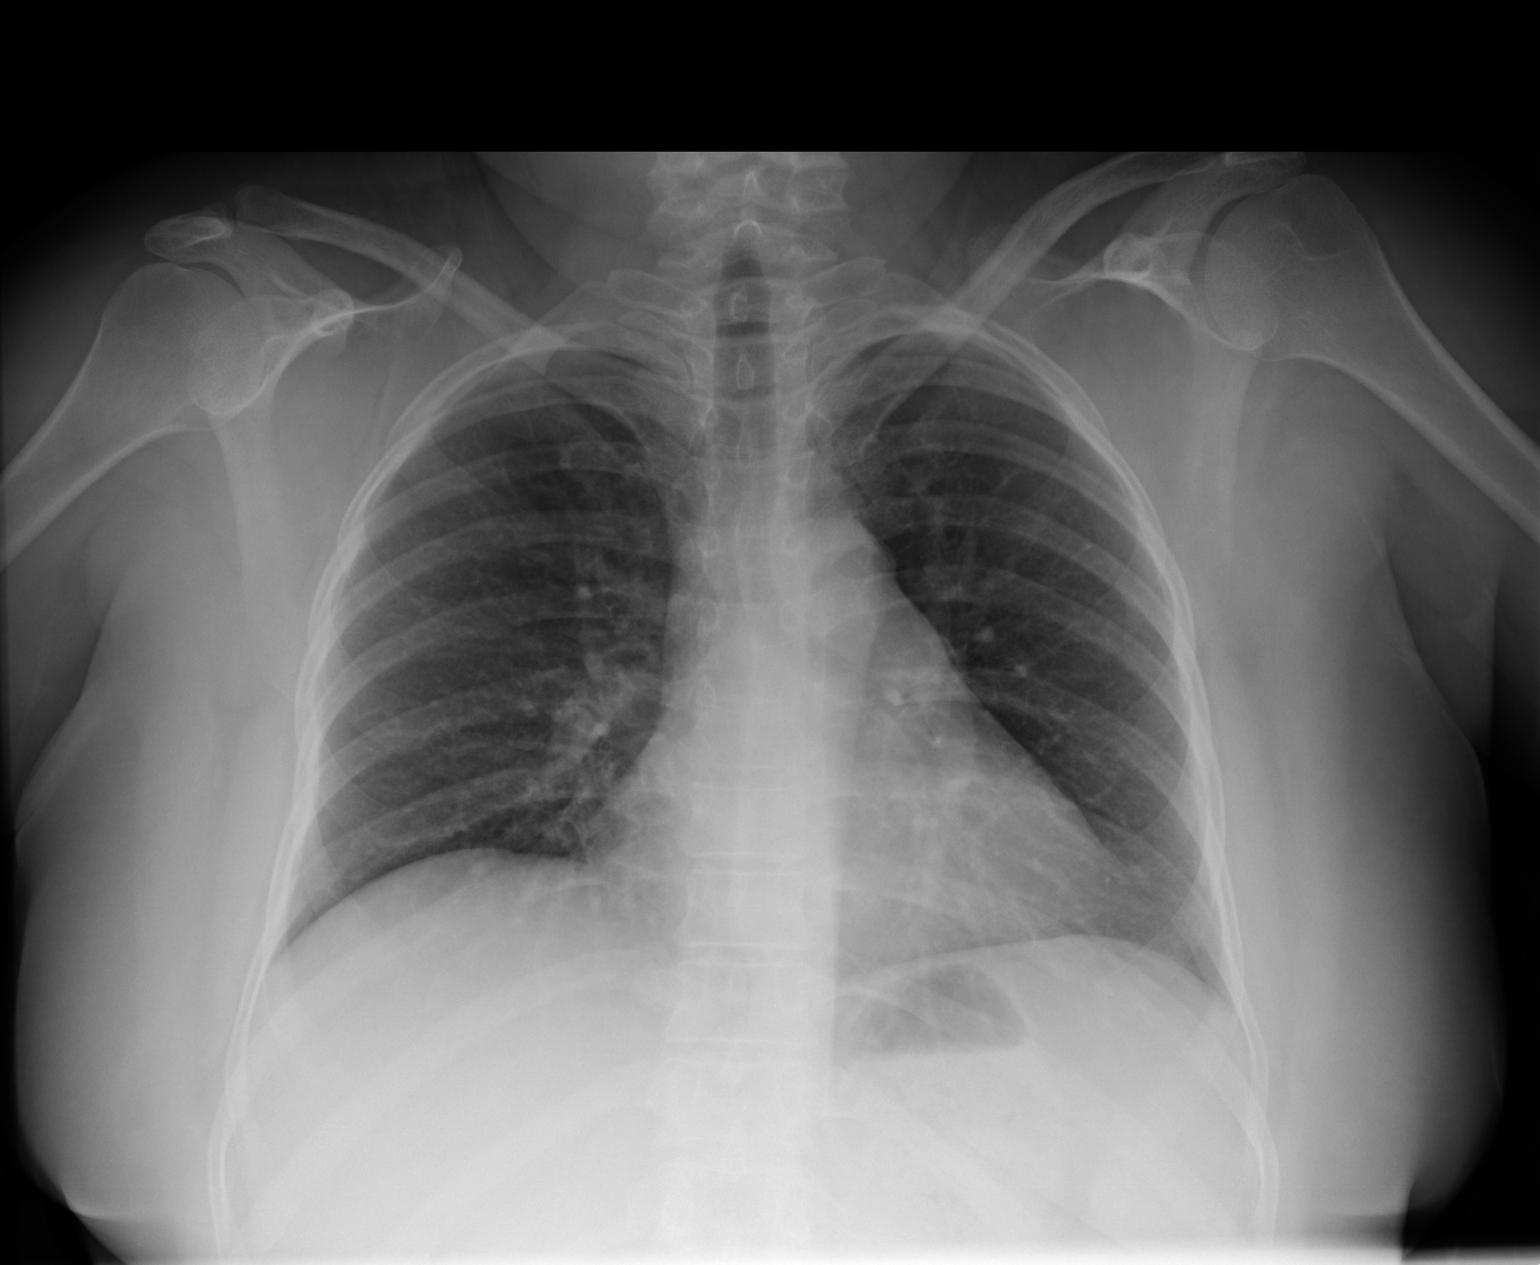
[im 2/2]
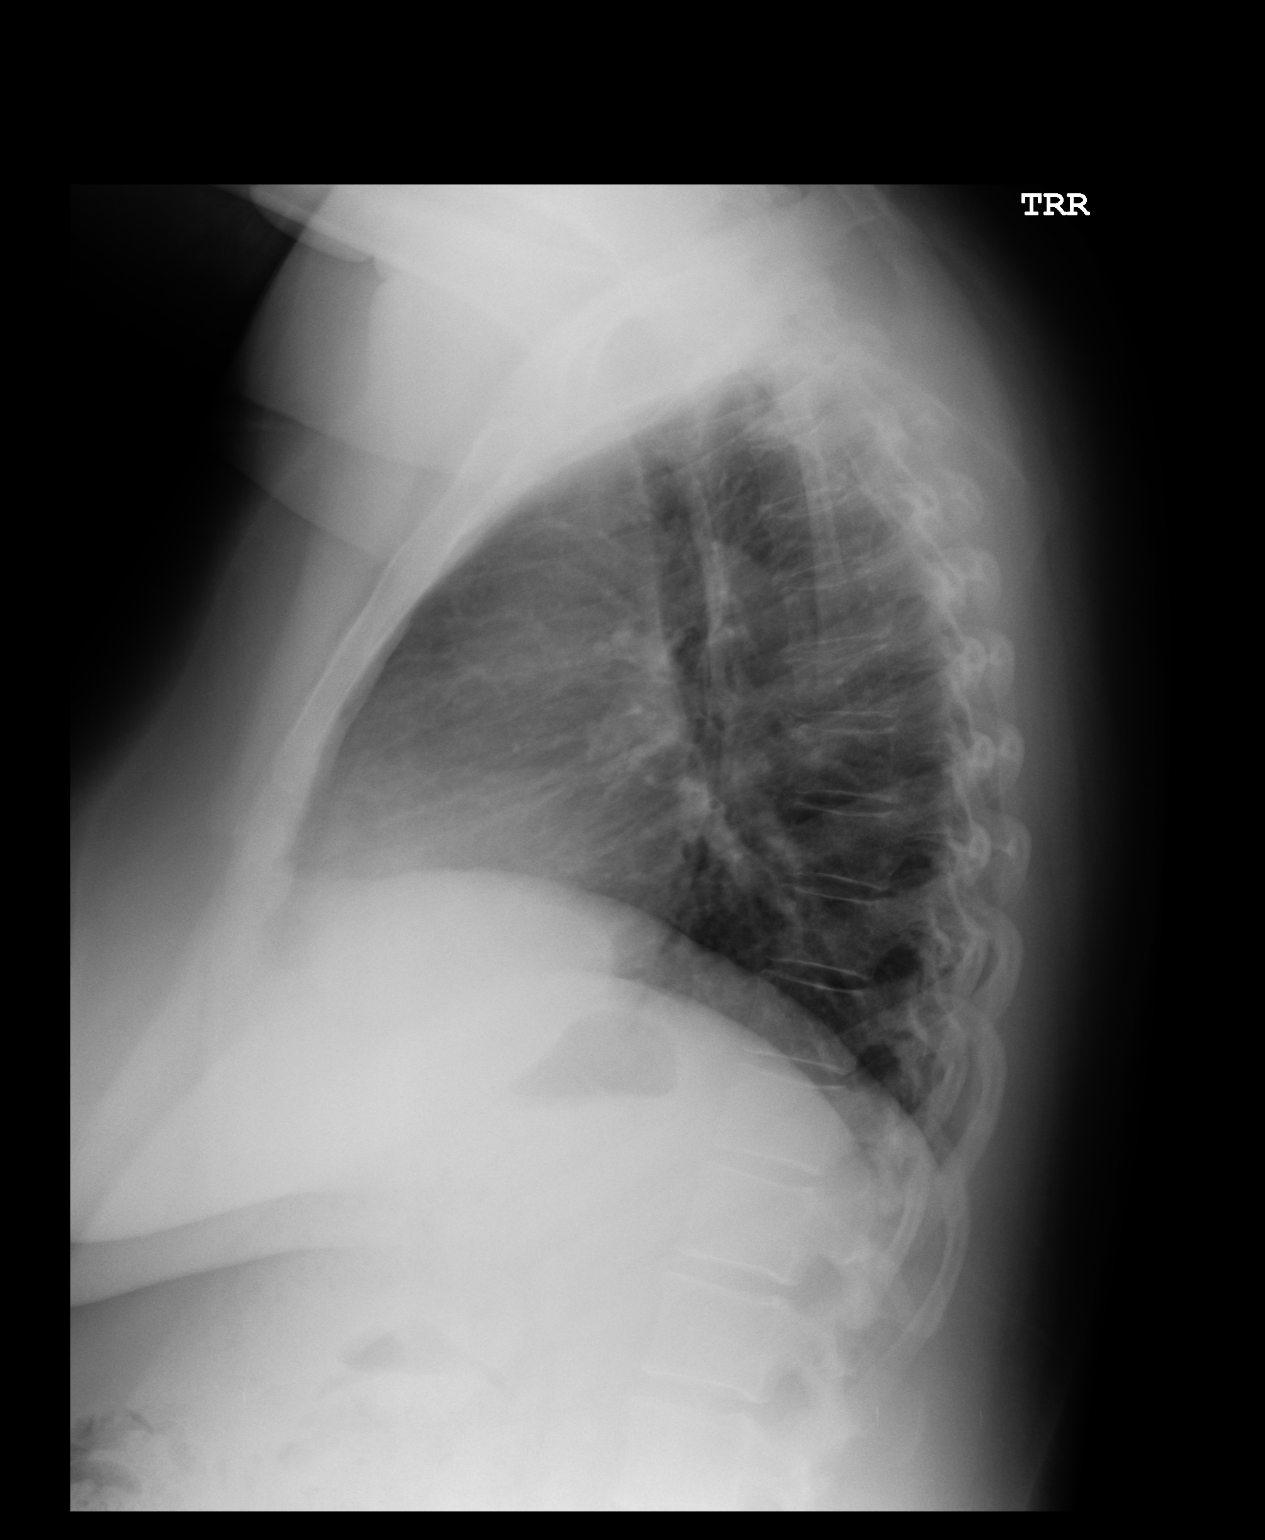

[2 of 2 positions shown; findings below may reference images not displayed]

PROCEDURE:     DXR - DXR CHEST PA (OR AP) AND LATERAL  - February 18, 2008  [DATE]

RESULT:     Comparison is made to the exam of 02/01/2008.

The lungs are clear. The heart and pulmonary vessels are normal. The bony
and mediastinal structures are unremarkable. There is no effusion. There is
no pneumothorax or evidence of congestive failure.
IMPRESSION: No acute cardiopulmonary disease. Stable appearance.

## 2008-11-09 ENCOUNTER — Ambulatory Visit: Payer: Self-pay | Admitting: Obstetrics & Gynecology

## 2008-11-12 IMAGING — CR DG CHEST 2V
1 series · 2 of 2 positions shown · non-contrast
Comparison: none

REASON FOR EXAM: Cough
COMMENTS:

[Series 1: view not recorded · 0.17mm/px · 2 of 2 slices shown]
[im 1/2]
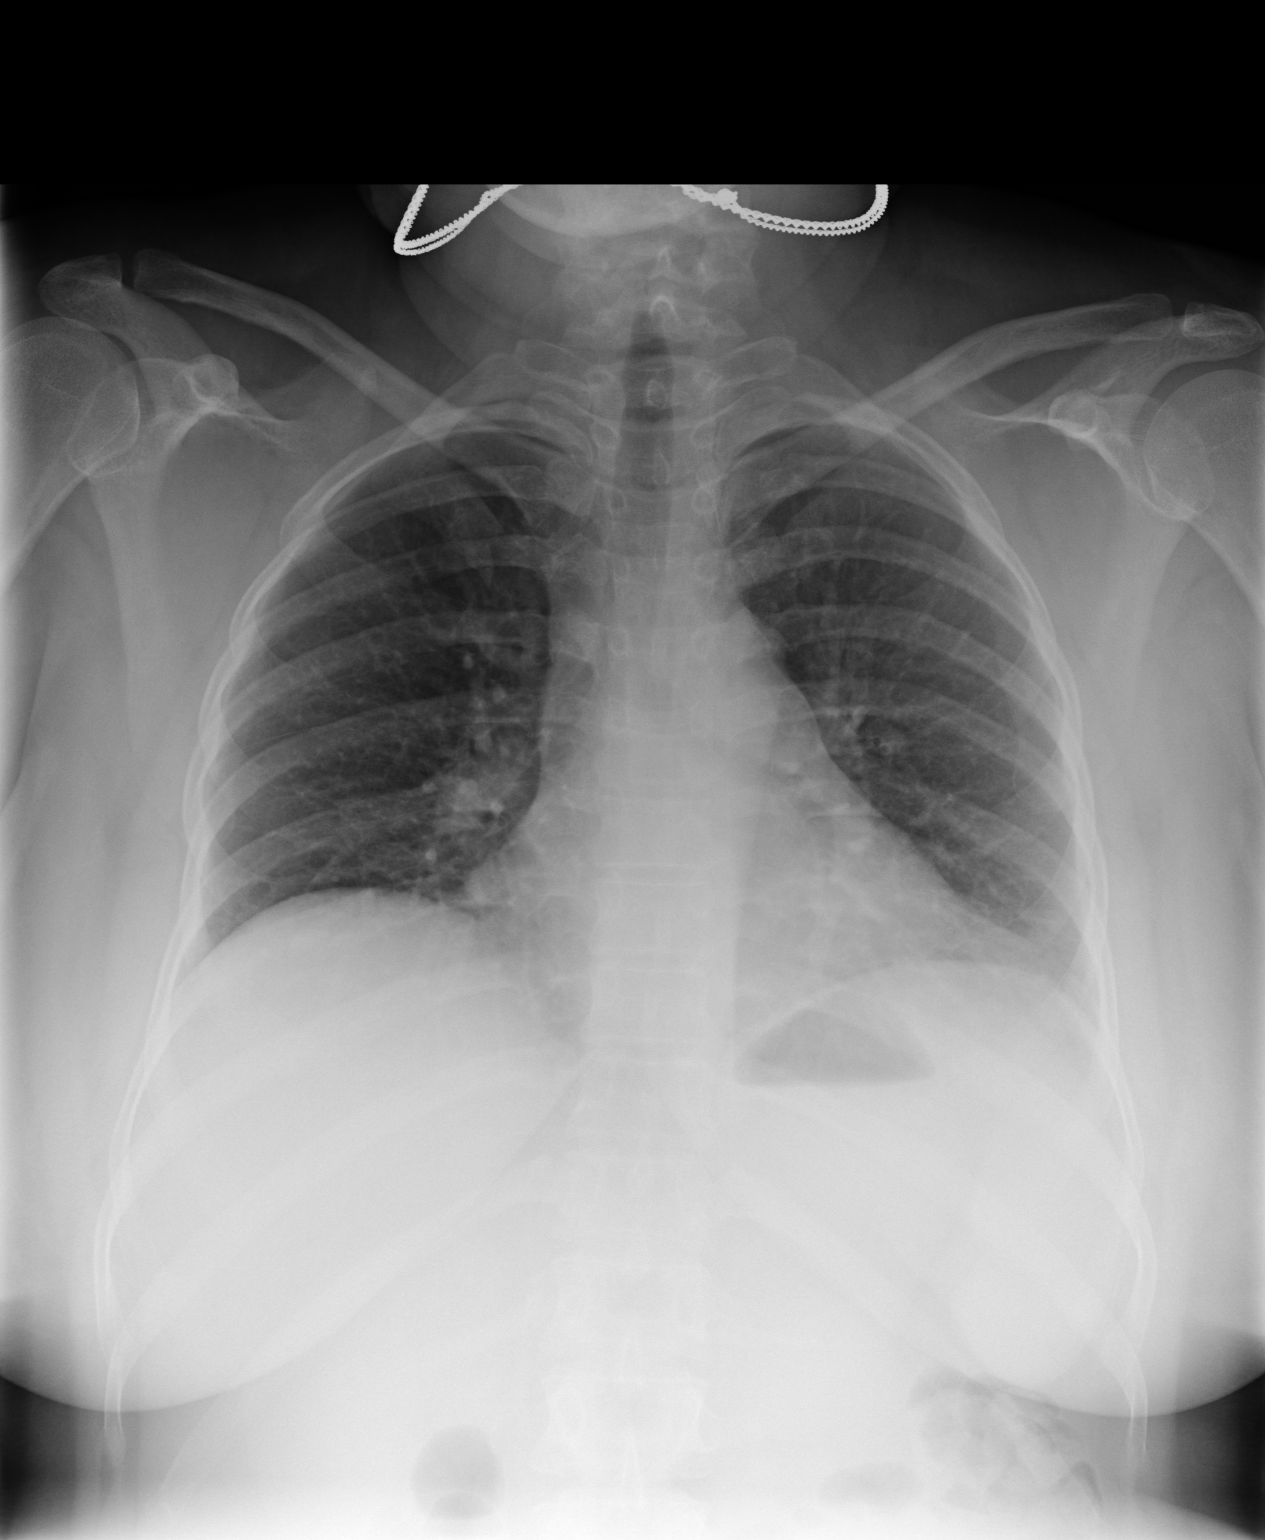
[im 2/2]
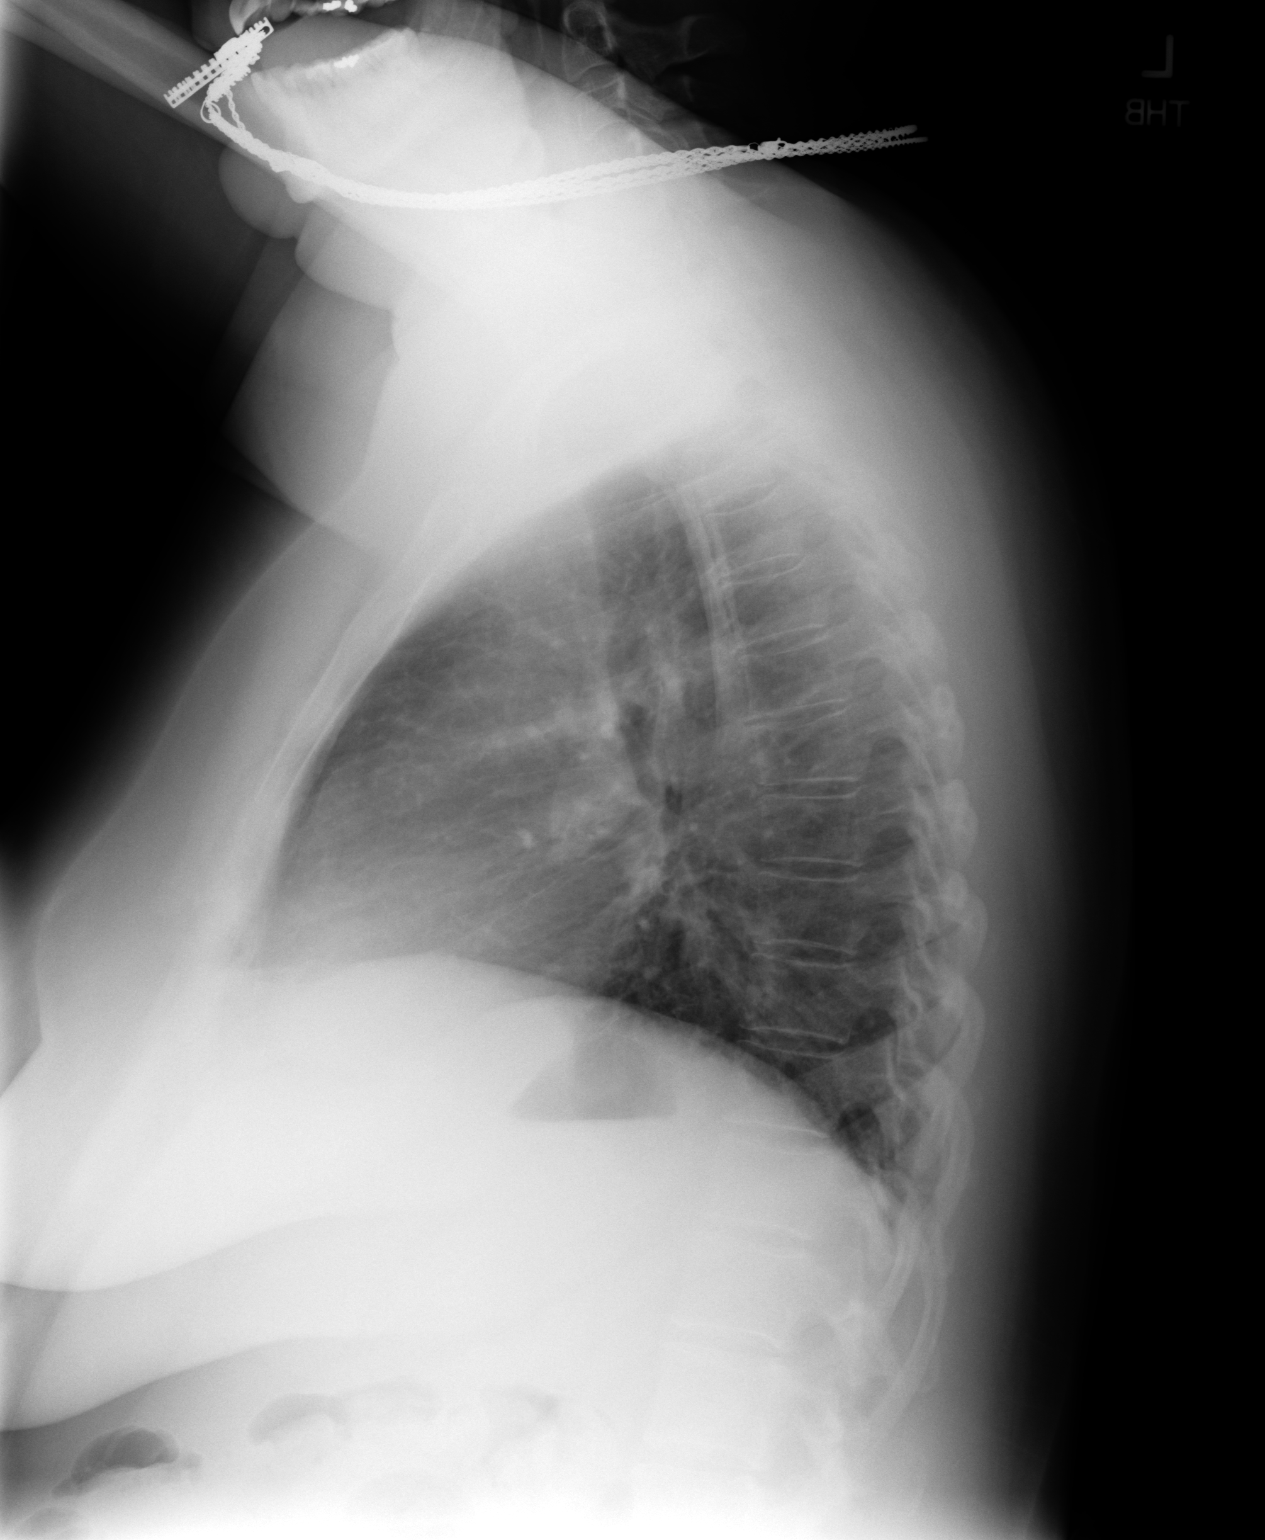

[2 of 2 positions shown; findings below may reference images not displayed]

PROCEDURE:     MDR - MDR CHEST PA(OR AP) AND LATERAL  - March 03, 2008  [DATE]

RESULT:     The patient has taken a shallow inspiration. With technique
taken into consideration, there is no evidence of focal infiltrates,
effusions or edema. The cardiac silhouette and visualized bony skeleton is
unremarkable.
IMPRESSION: Shallow inspiration without evidence of acute
cardiopulmonary disease.

## 2008-11-23 ENCOUNTER — Ambulatory Visit: Payer: Self-pay | Admitting: Obstetrics & Gynecology

## 2008-12-07 ENCOUNTER — Ambulatory Visit: Payer: Self-pay | Admitting: Internal Medicine

## 2008-12-15 IMAGING — CT CT CERVICAL SPINE WITHOUT CONTRAST
1 series · 12 of 14 positions shown, 15 images · non-contrast
Comparison: none

REASON FOR EXAM: Neck pain history of cervical disc disease
COMMENTS:   LMP: Post-Menopausal

[Series 5: axial · axial · 0.29mm/px · z∈[-319,-197]mm · 12 of 78 slices shown, 15 images]
[im 6/78  soft-tissue]
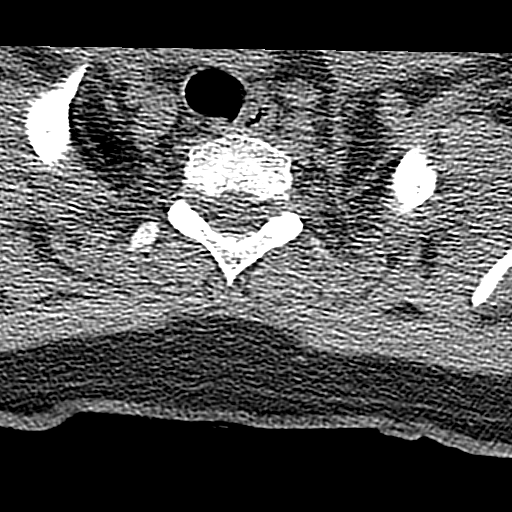
[im 6/78  bone]
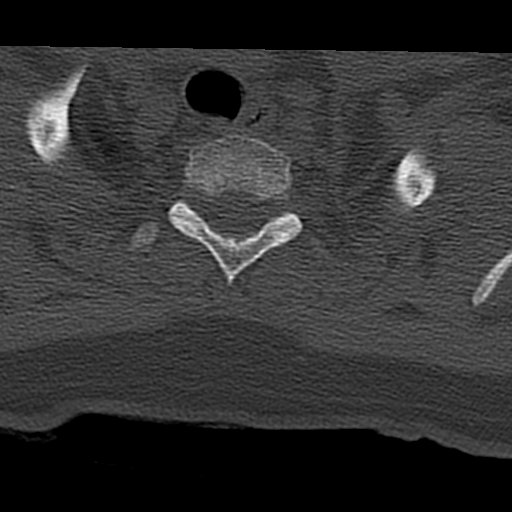
[im 12/78  bone]
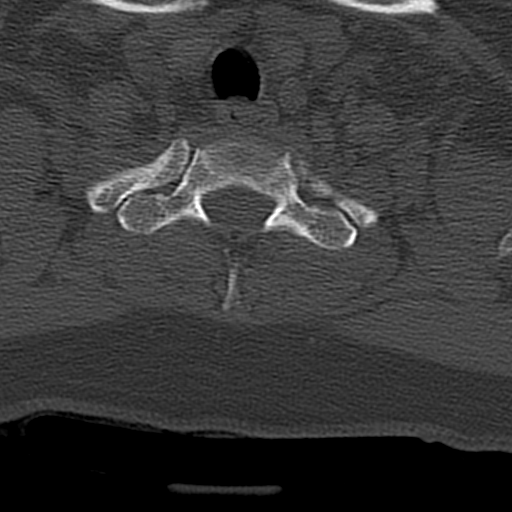
[im 18/78  bone]
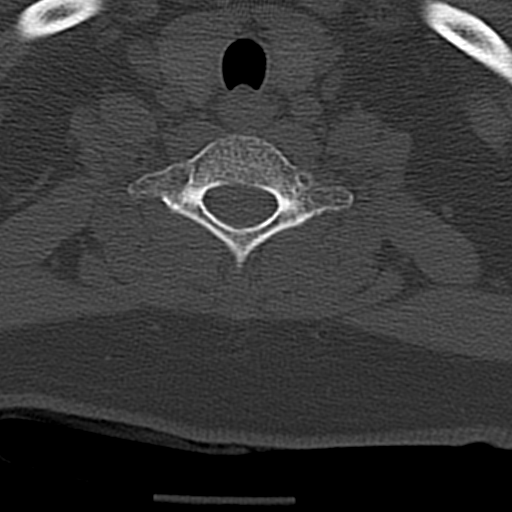
[im 24/78  bone]
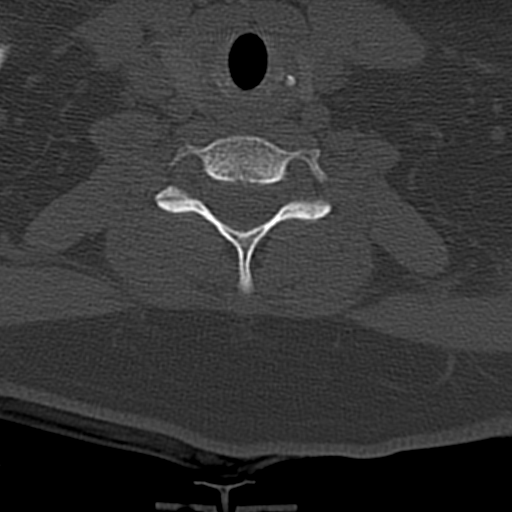
[im 30/78  soft-tissue]
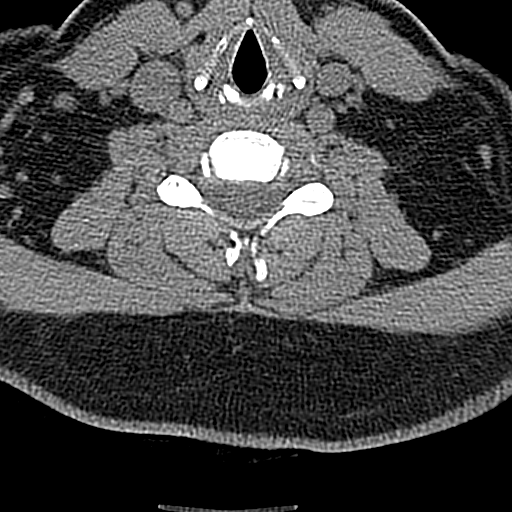
[im 30/78  bone]
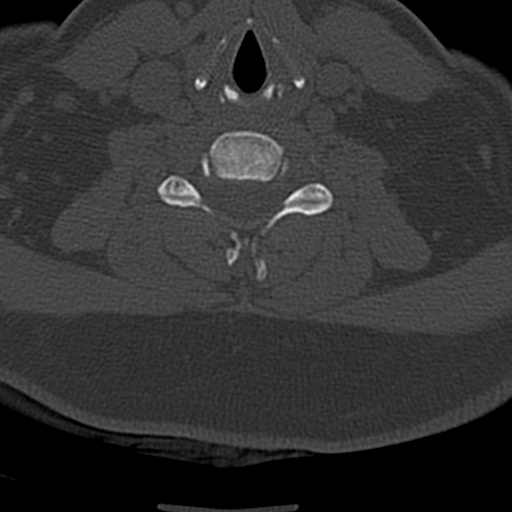
[im 36/78  bone]
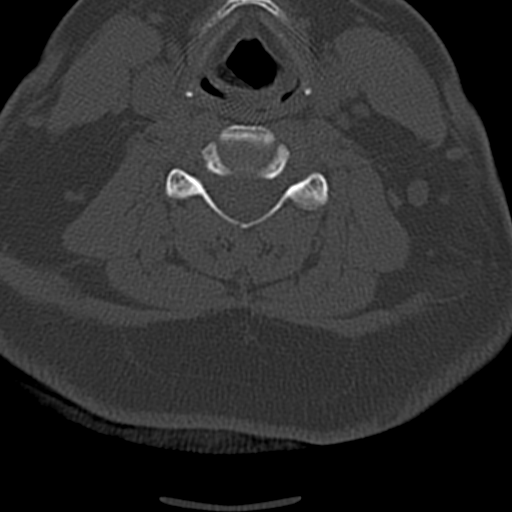
[im 42/78  bone]
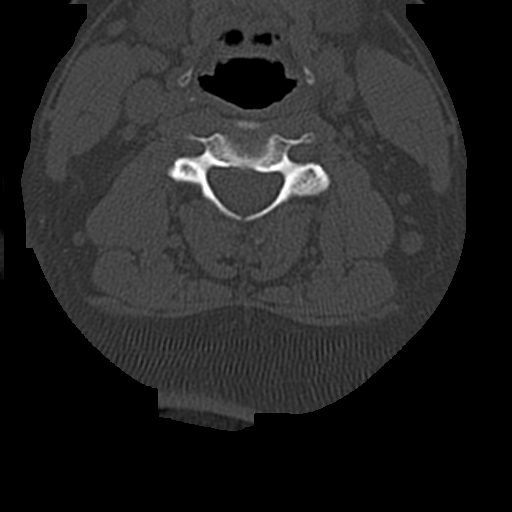
[im 48/78  bone]
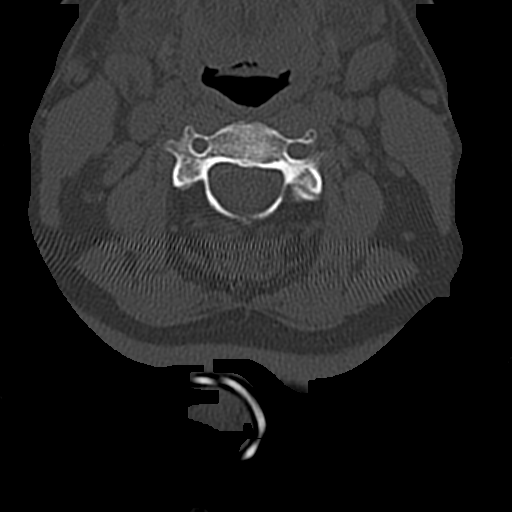
[im 54/78  soft-tissue]
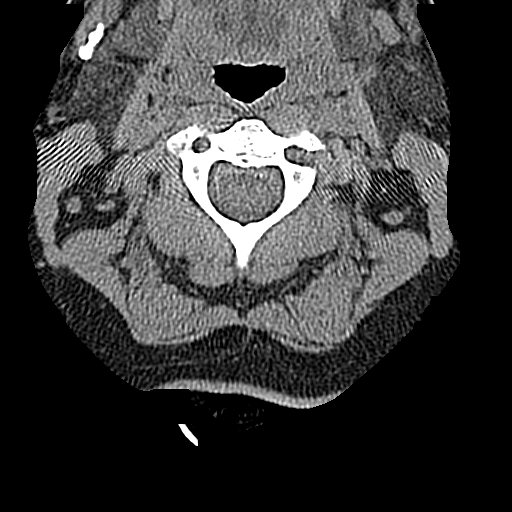
[im 54/78  bone]
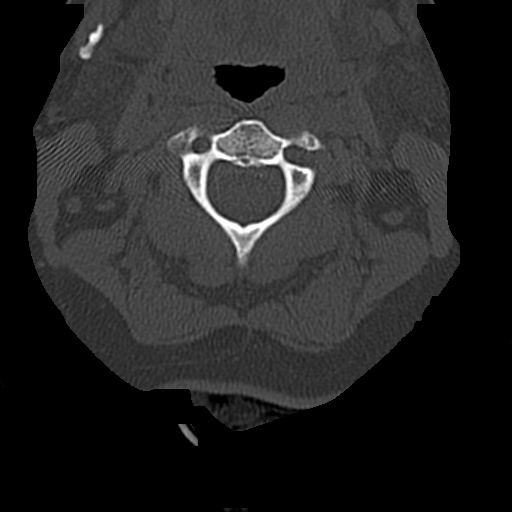
[im 60/78  bone]
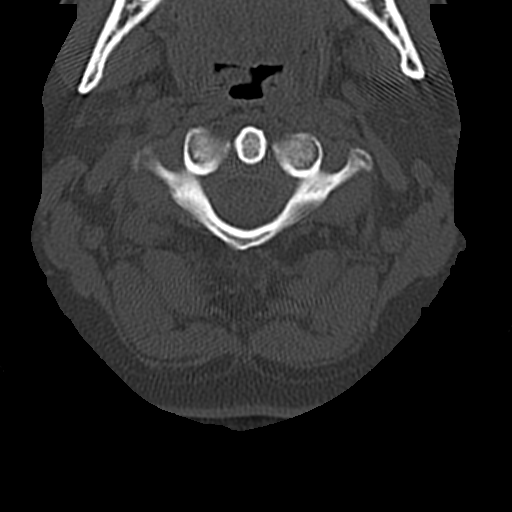
[im 66/78  bone]
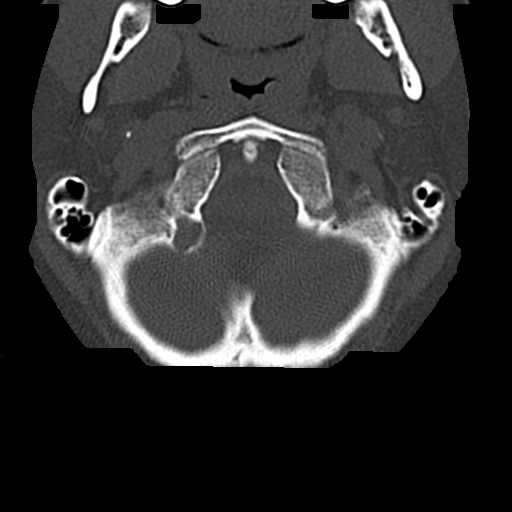
[im 72/78  bone]
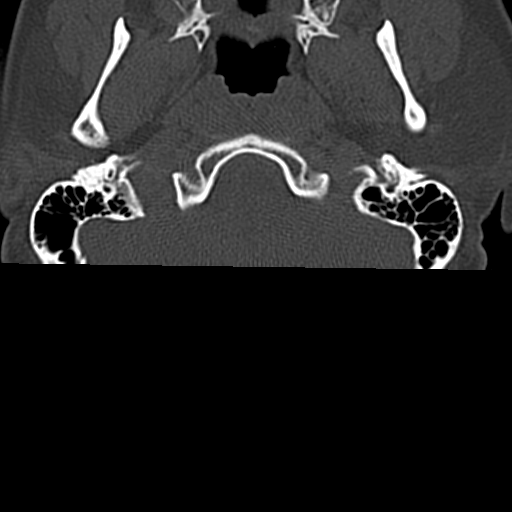

[12 of 14 positions shown; findings below may reference images not displayed]

PROCEDURE:     CT  - CT CERVICAL SPINE WO  - April 05, 2008  [DATE]

RESULT:     Noncontrast emergent CT of the cervical spine is performed.
Axial, sagittal and coronal reconstructions are performed at bone window
settings. There is no previous similar examination for comparison.

The prevertebral soft tissues appear to be normal. The spinal alignment is
within acceptable limits of normal. No fracture is evident. The facets
appear to be normally aligned. The craniocervical junction is unremarkable.
IMPRESSION: No acute cervical spine bony abnormality evident.

## 2008-12-15 IMAGING — CR DG KNEE 1-2V*R*
1 series · 2 of 2 positions shown · non-contrast
Comparison: none

REASON FOR EXAM: pain from fall
COMMENTS:

PROCEDURE:     DXR - DXR KNEE RIGHT AP AND LATERAL  - April 05, 2008  [DATE]
RESULT:     AP and lateral views of the RIGHT knee reveal the bones to be
adequately mineralized. I do not see evidence of an acute fracture. The
overlying soft tissues are normal in appearance.

[Series 1: view not recorded · 0.17mm/px · 2 of 2 slices shown]
[im 1/2]
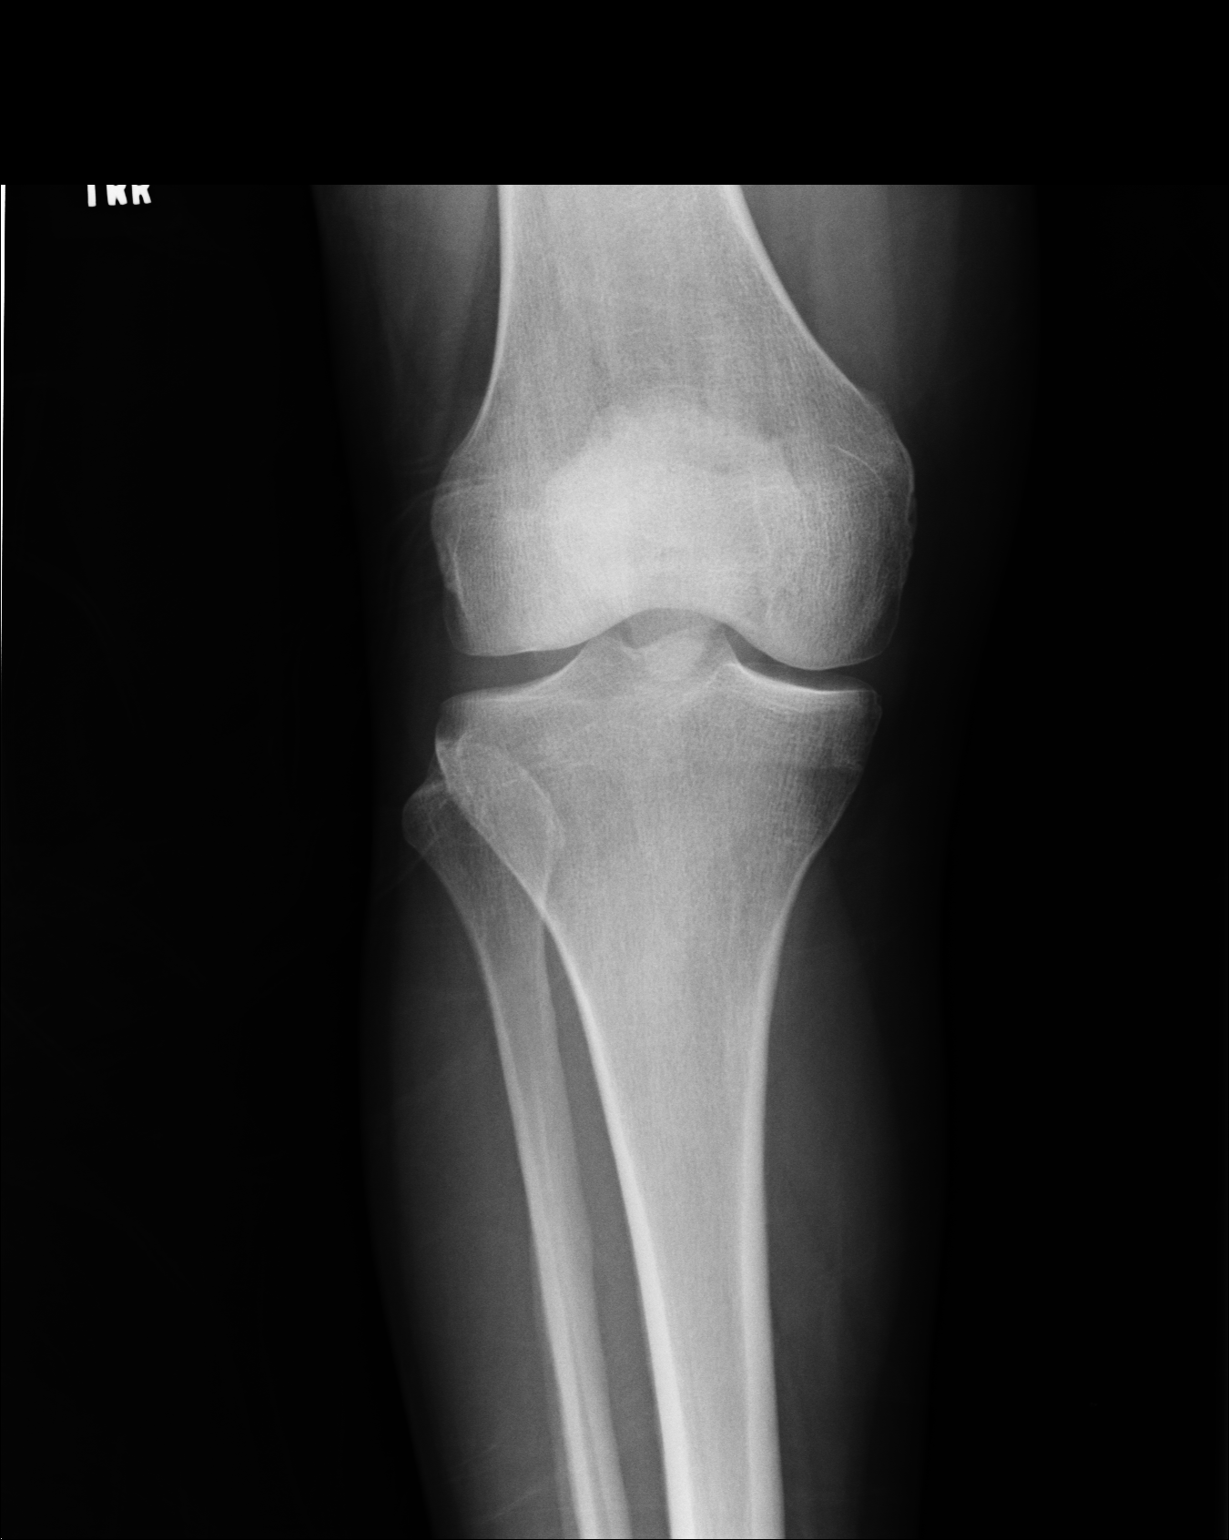
[im 2/2]
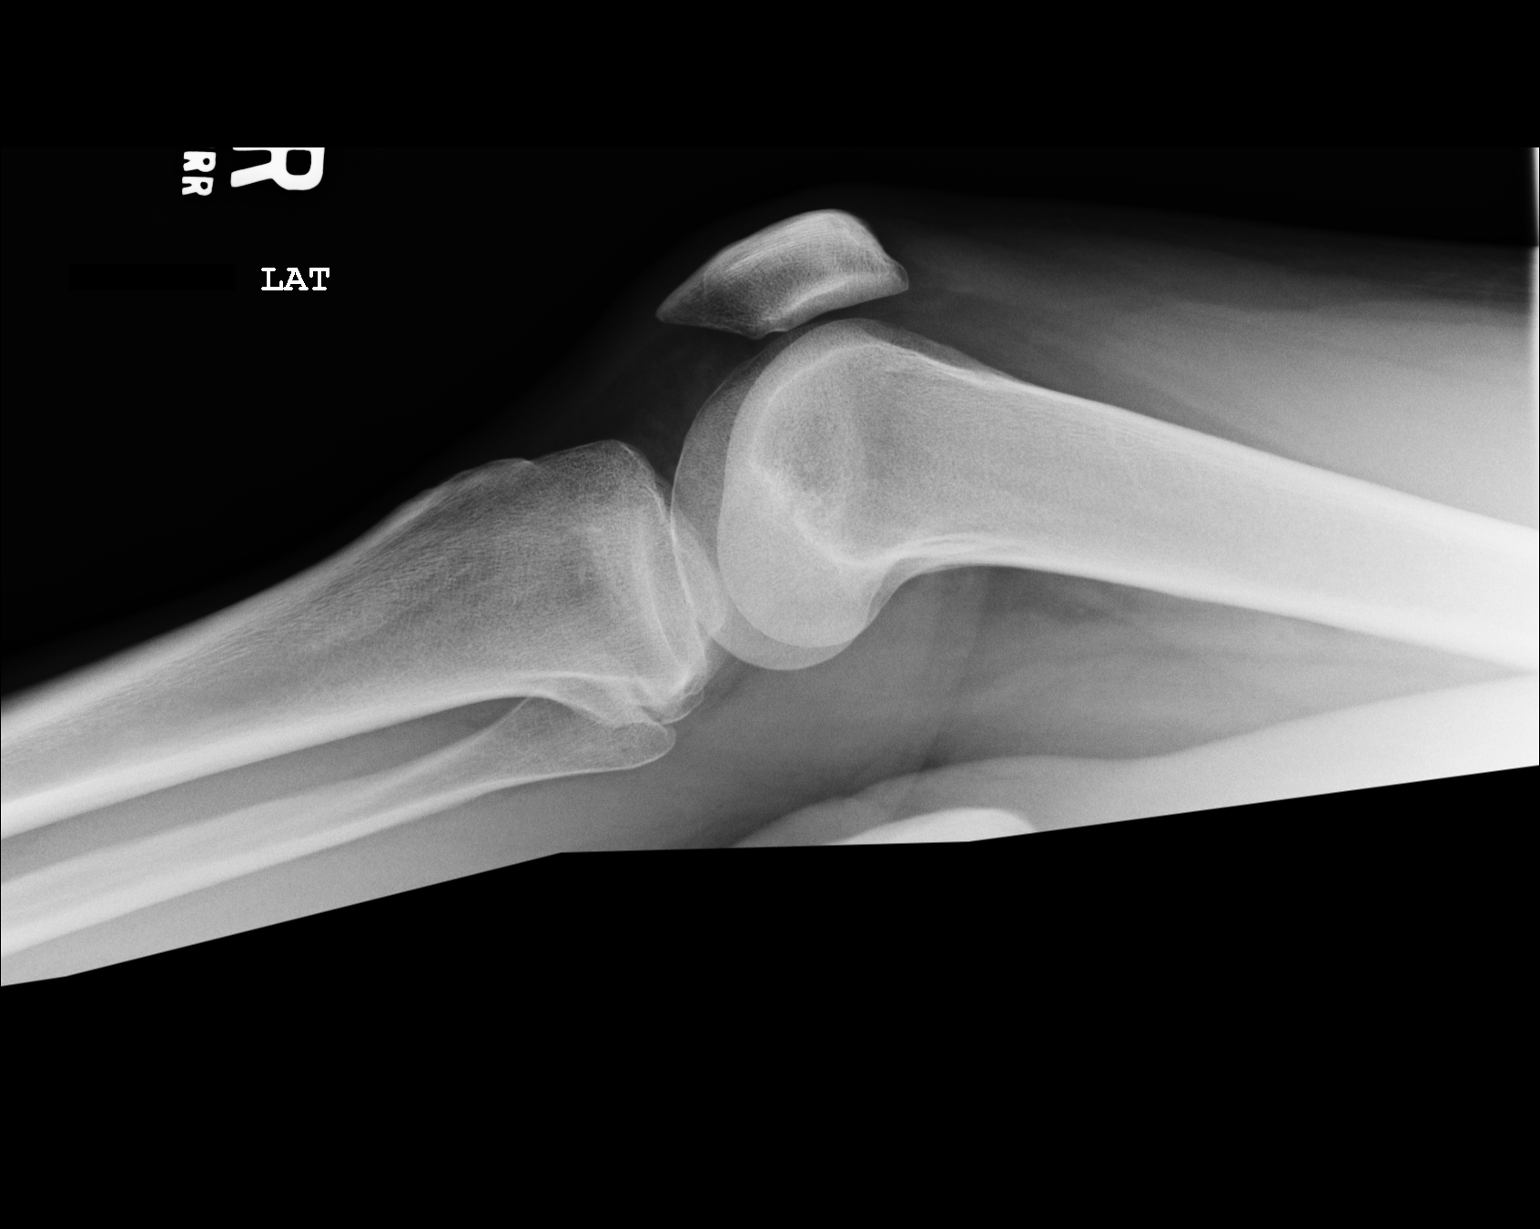

[2 of 2 positions shown; findings below may reference images not displayed]

IMPRESSION: I see no acute bony abnormality of the RIGHT knee.

## 2008-12-17 ENCOUNTER — Emergency Department: Payer: Self-pay | Admitting: Emergency Medicine

## 2009-01-12 IMAGING — CR DG CHEST 2V
1 series · 2 of 2 positions shown · non-contrast
Comparison: none

REASON FOR EXAM: cough
COMMENTS:

[Series 1: view not recorded · 0.17mm/px · 2 of 2 slices shown]
[im 1/2]
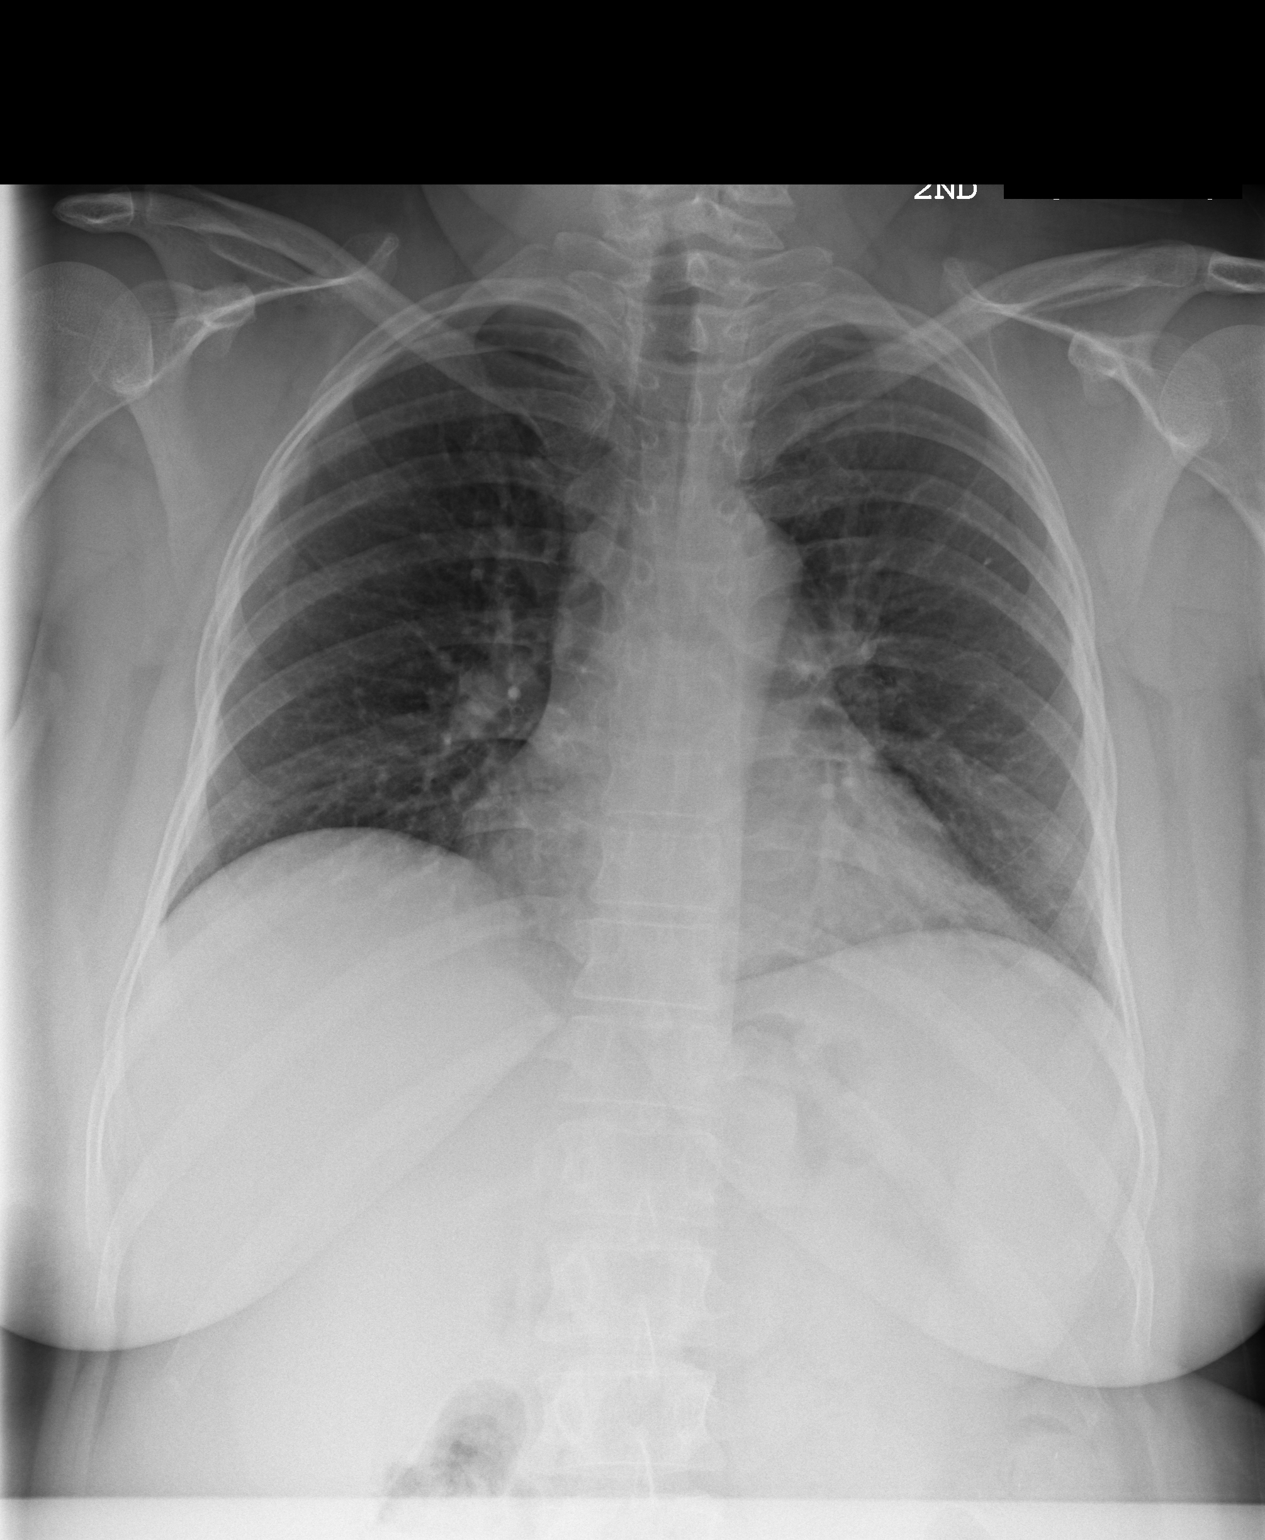
[im 2/2]
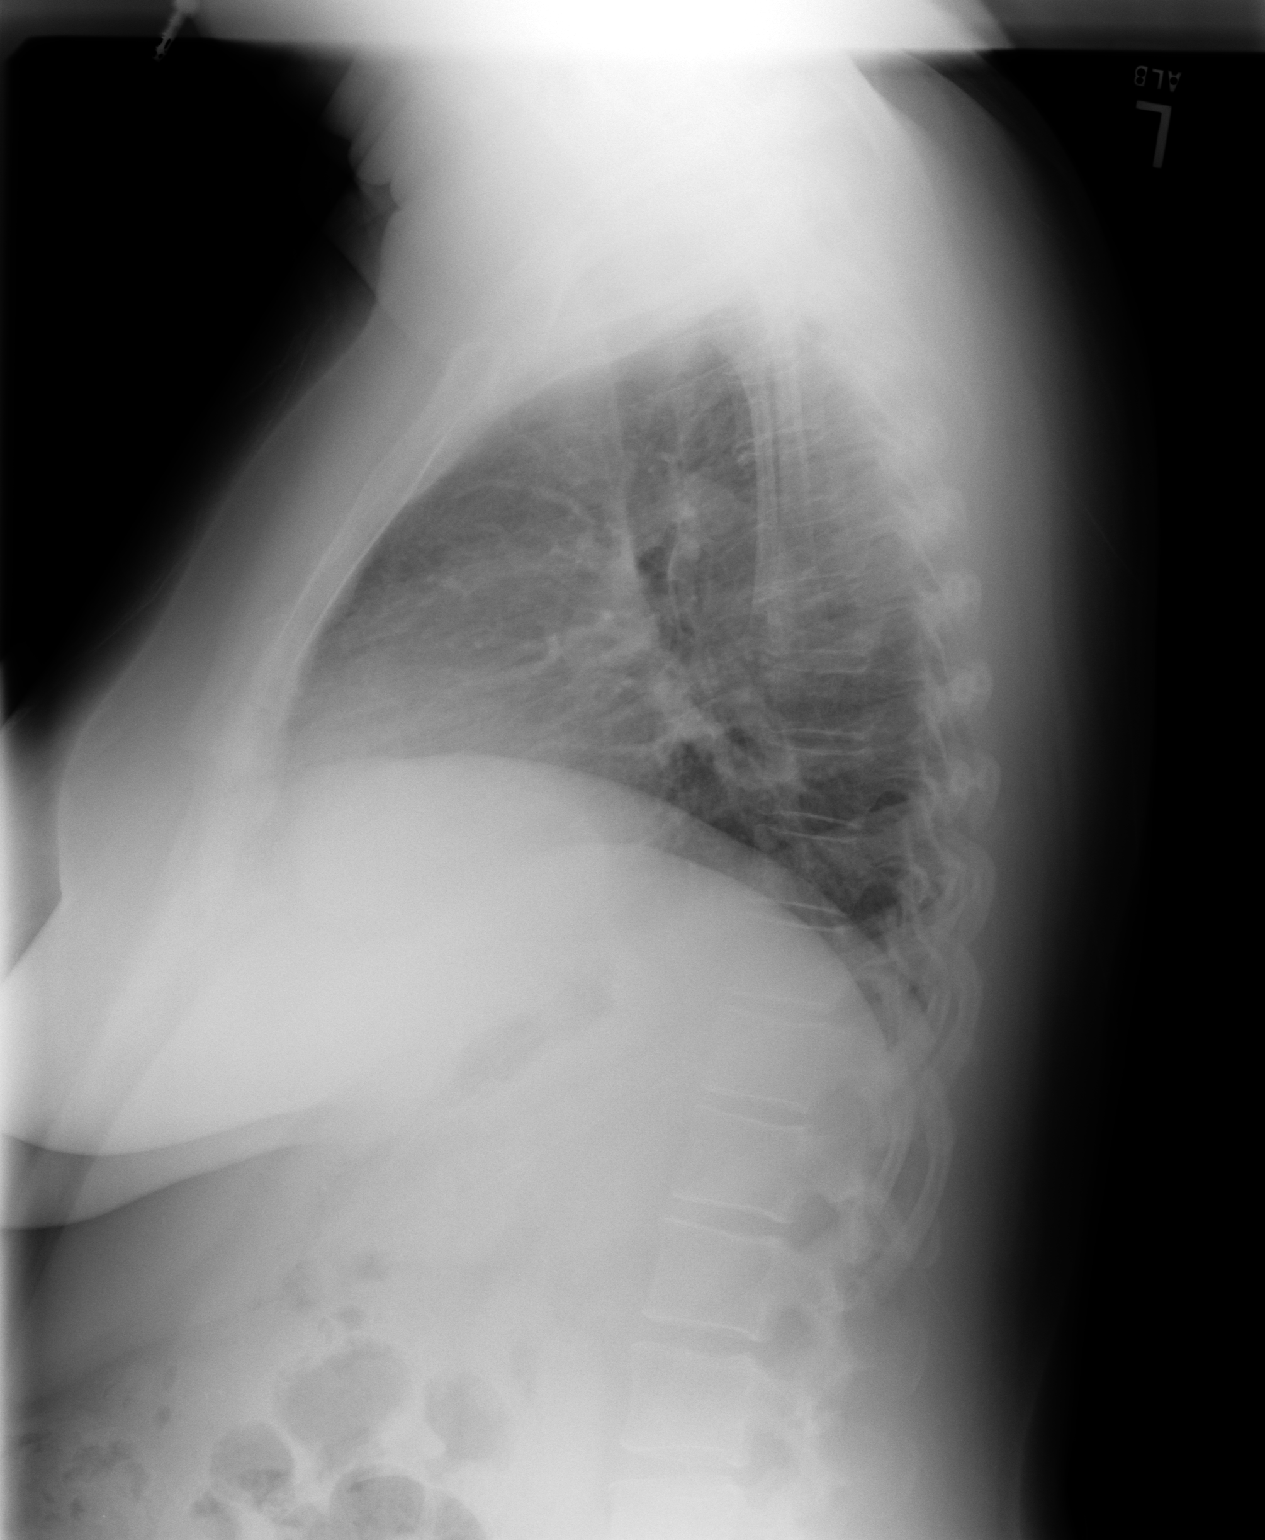

[2 of 2 positions shown; findings below may reference images not displayed]

PROCEDURE:     MDR - MDR CHEST PA(OR AP) AND LATERAL  - May 03, 2008  [DATE]

RESULT:     Comparison is made to the exam of 03/03/2008.

The lungs are clear. The heart and pulmonary vessels are normal. The bony
and mediastinal structures are unremarkable. There is no effusion. There is
no pneumothorax or evidence of congestive failure.
IMPRESSION: No acute cardiopulmonary disease. Stable appearance.

## 2009-01-20 ENCOUNTER — Ambulatory Visit: Payer: Self-pay | Admitting: Surgery

## 2009-01-27 ENCOUNTER — Emergency Department: Payer: Self-pay | Admitting: Emergency Medicine

## 2009-02-26 IMAGING — CT CT ABD-PELV W/O CM
1 of 2 series · 15 of 32 positions shown, 19 images · non-contrast
Comparison: none

REASON FOR EXAM: (1) ALLERGY TO SEAFOOD, RUQ/EPIGASTRIC PAIN; (2) LLQ PAIN
COMMENTS:

[Series 2: abd/pelvis · axial · 0.69mm/px · z∈[-370,+14]mm · 15 of 143 slices shown, 19 images]
[im 10/143  soft-tissue]
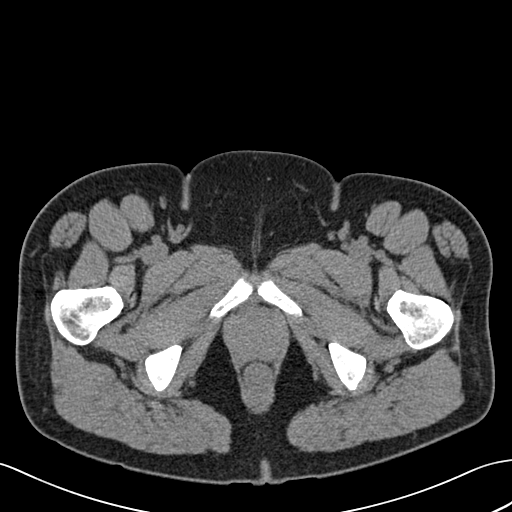
[im 10/143  bone]
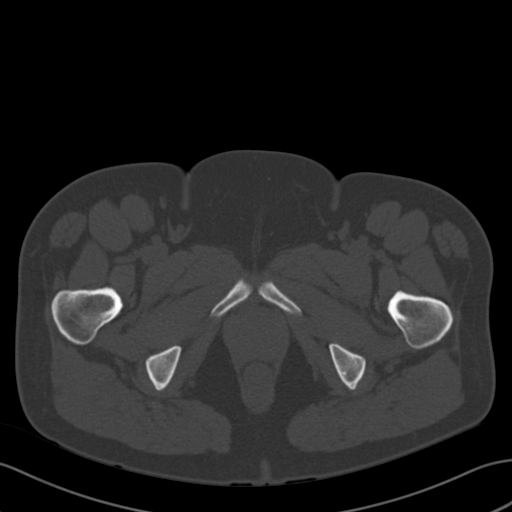
[im 20/143  soft-tissue]
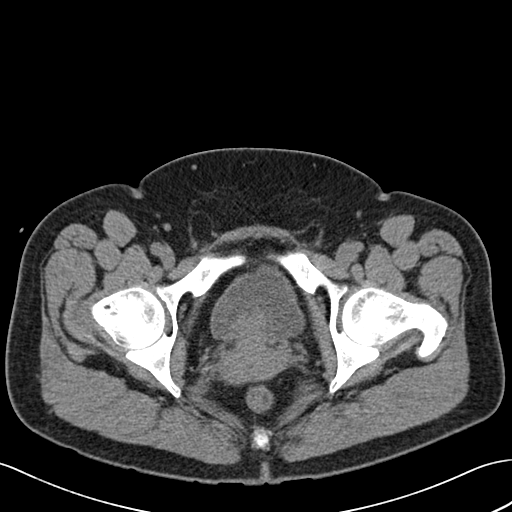
[im 30/143  soft-tissue]
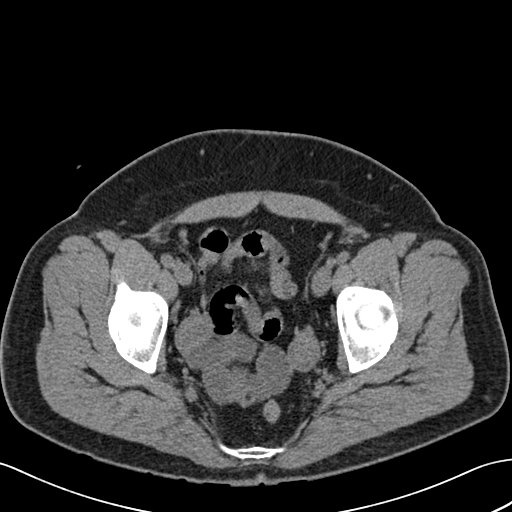
[im 40/143  soft-tissue]
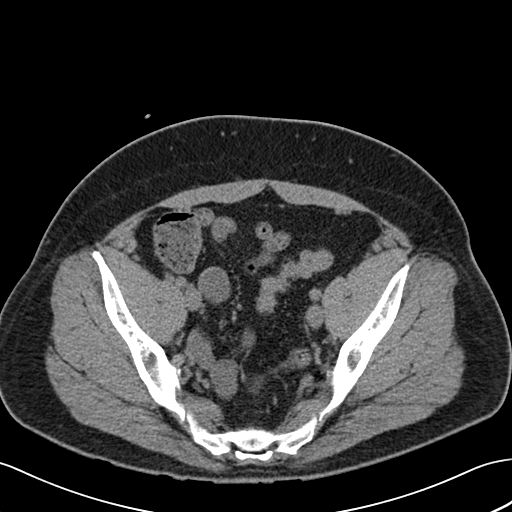
[im 49/143  soft-tissue]
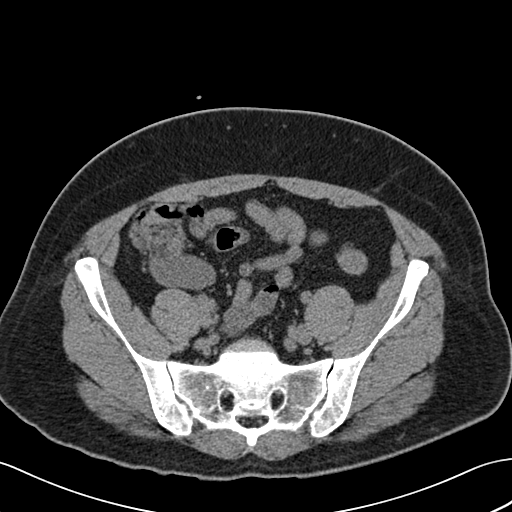
[im 59/143  soft-tissue]
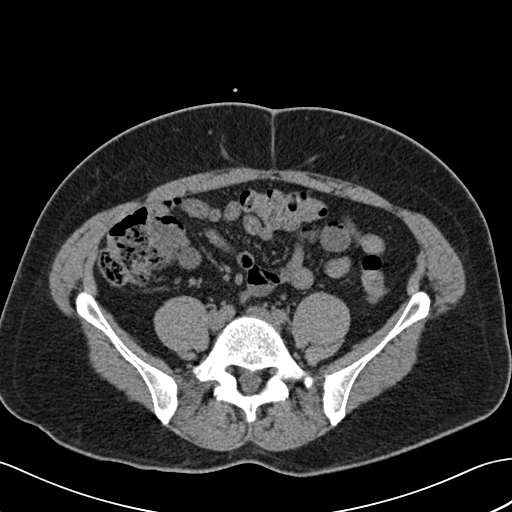
[im 74/143  soft-tissue]
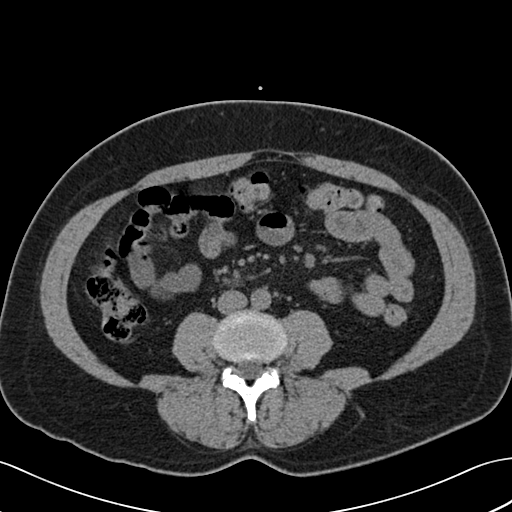
[im 84/143  soft-tissue]
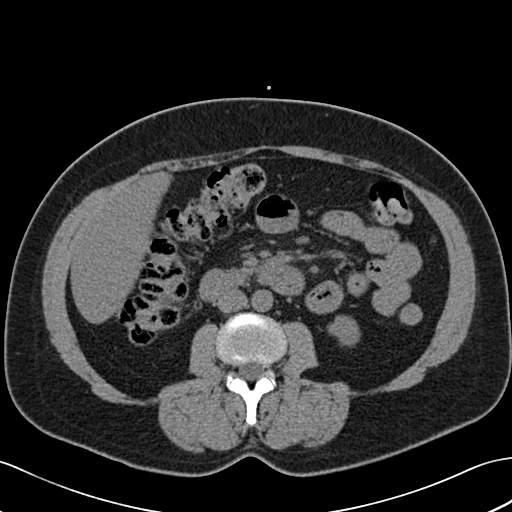
[im 94/143  soft-tissue]
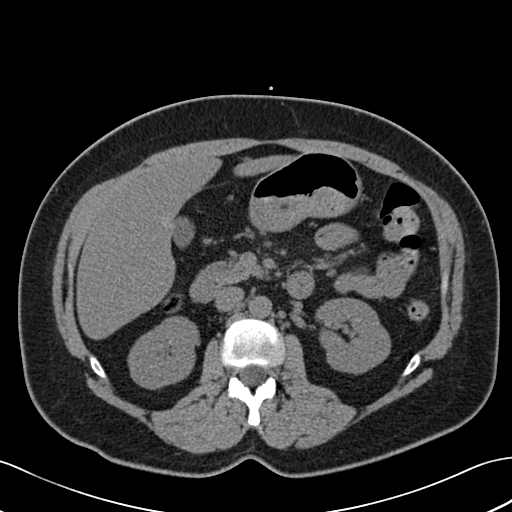
[im 94/143  bone]
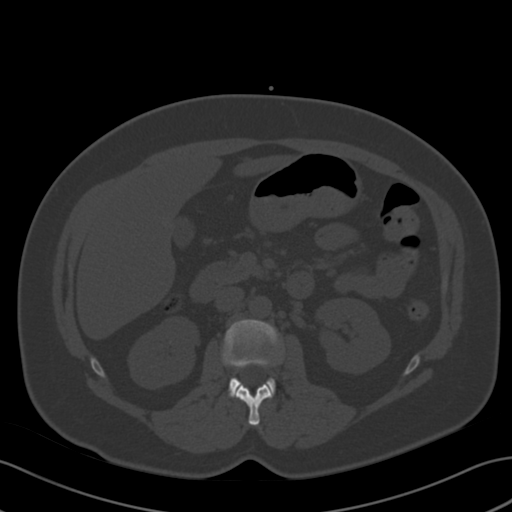
[im 103/143  soft-tissue]
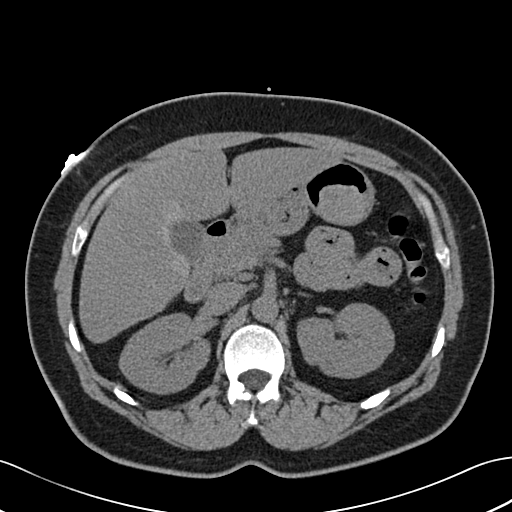
[im 113/143  soft-tissue]
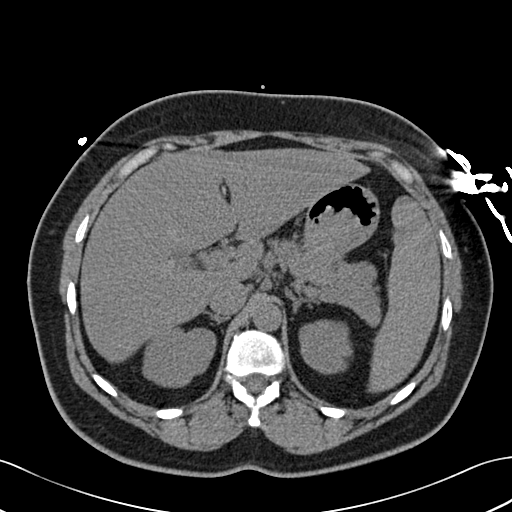
[im 123/143  soft-tissue]
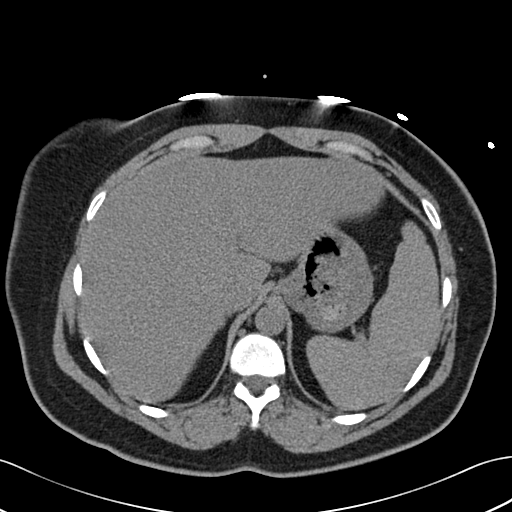
[im 123/143  lung]
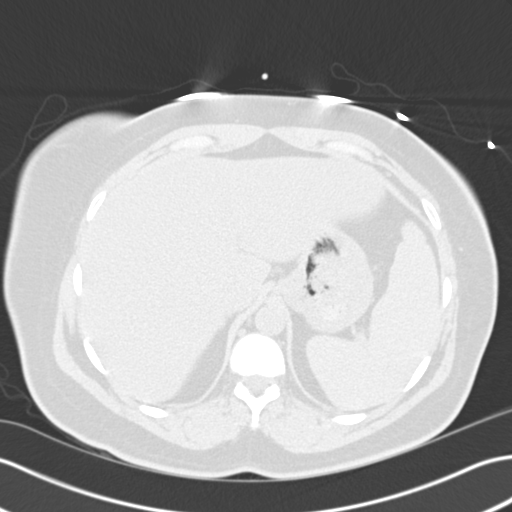
[im 128/143  lung]
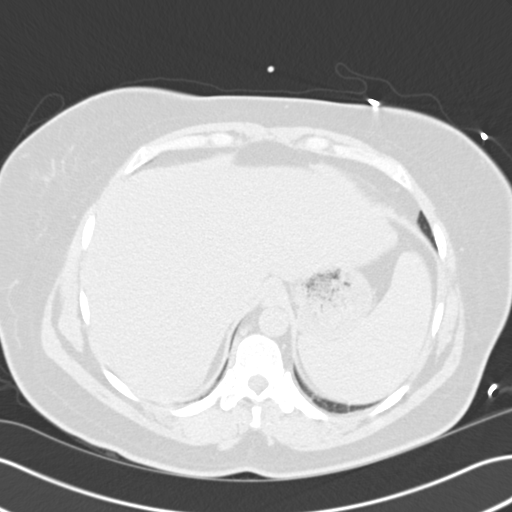
[im 133/143  soft-tissue]
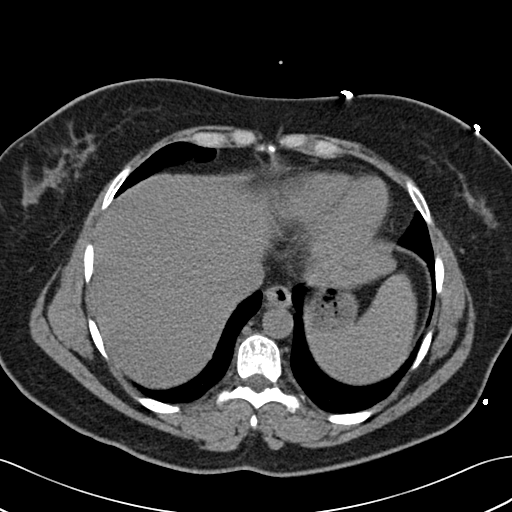
[im 133/143  lung]
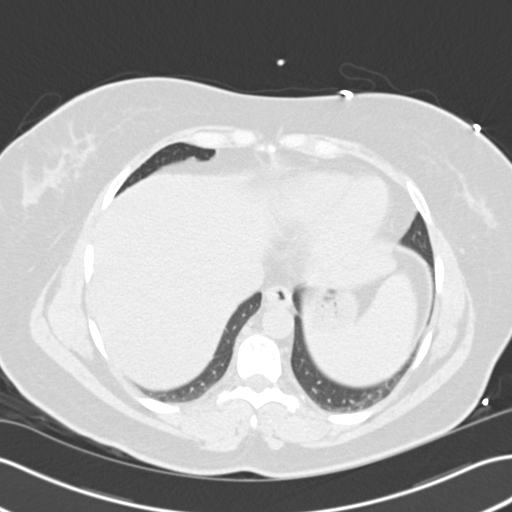
[im 138/143  lung]
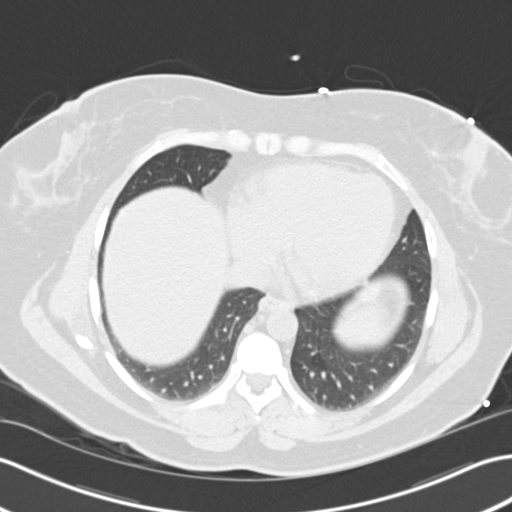

[15 of 32 positions shown; findings below may reference images not displayed]

PROCEDURE:     CT  - CT ABDOMEN AND PELVIS W[DATE]  [DATE]

RESULT:     Helical non-contrast 3 mm sections were obtained from the lung
bases through the pubic symphysis.

Evaluation of the lung bases demonstrates no gross abnormalities.

Within the limits of a noncontrasted CT, the liver, spleen, adrenals,
pancreas and kidneys are unremarkable. There is no evidence of abdominal
free fluid, drainable loculated fluid collections, gross evidence of masses
or adenopathy. There is no CT evidence reflecting the sequela of bowel
obstruction nor colitis, enteritis, appendicitis or diverticulitis.  Mild
diverticulosis is demonstrated within the sigmoid colon.
IMPRESSION: 1.No evidence of focal or acute intraabdominal abnormalities within the
limitations of a noncontrast CT.

Dr. Ponyo of the Emergency Room was informed of these findings via a
preliminary faxed report on 04-24-07 at [DATE] Central Time.
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
## 2009-03-15 ENCOUNTER — Emergency Department: Payer: Self-pay | Admitting: Emergency Medicine

## 2009-03-19 IMAGING — CR PELVIS - 1-2 VIEW
1 series · 1 of 1 positions shown · non-contrast
Comparison: none

REASON FOR EXAM: pain
COMMENTS:

[view not recorded]
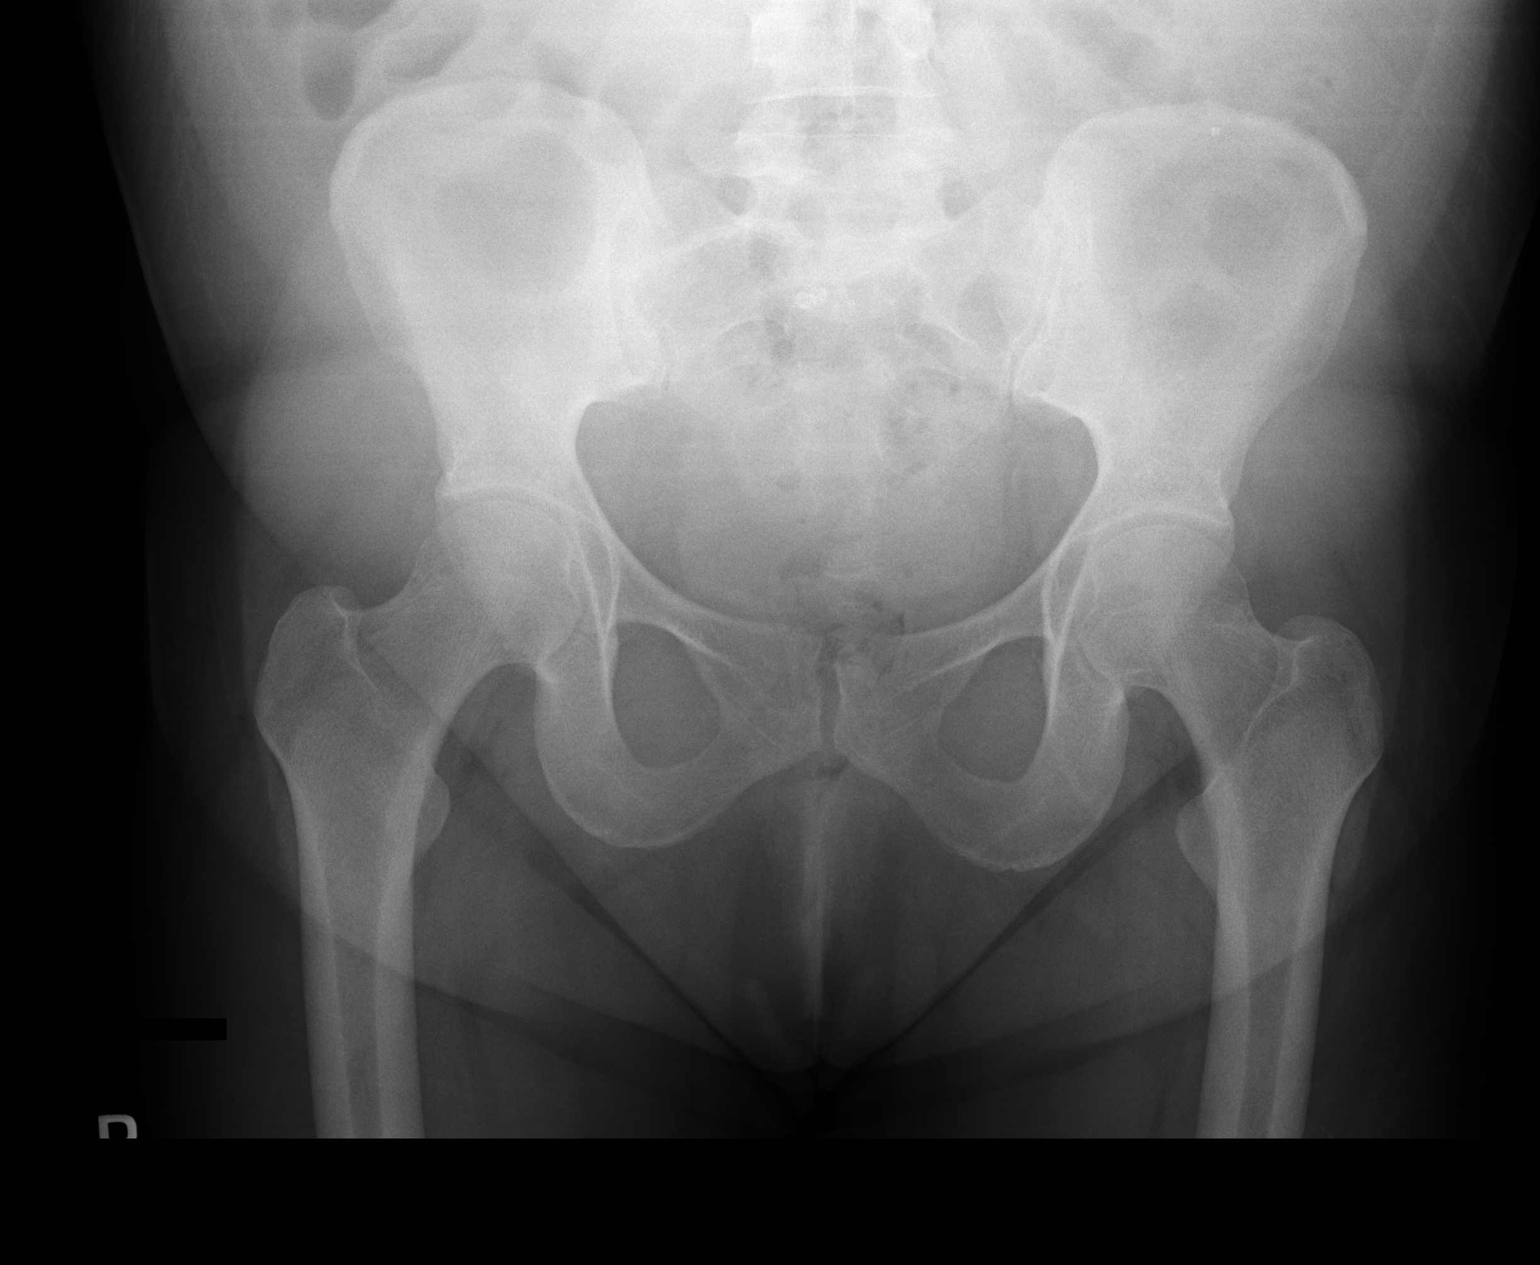

[1 of 1 positions shown; findings below may reference images not displayed]

PROCEDURE:     DXR - DXR PELVIS AP ONLY  - July 08, 2008 [DATE]

RESULT:     Comparison is made to a prior exam of 04/05/2008. No fracture or
other acute bony abnormality is identified. The hip joint spaces are well
maintained. The sacroiliac joints are normal in appearance. No lytic or
blastic lesions are identified.
IMPRESSION: 1.     No significant abnormalities are noted.

## 2009-04-30 ENCOUNTER — Ambulatory Visit: Payer: Self-pay | Admitting: Internal Medicine

## 2009-05-03 IMAGING — CT CT HEAD WITHOUT CONTRAST
2 series · 16 of 30 positions shown, 20 images · non-contrast
Comparison: none

REASON FOR EXAM: headache, visual blurriness, light headed
COMMENTS:

PROCEDURE:     CT  - CT HEAD WITHOUT CONTRAST  - August 22, 2008 [DATE]
RESULT:
HISTORY: Fall.
COMPARISON STUDIES:   Head CT of 02/18/08.

[Series 2: without · axial · non-contrast · 0.40mm/px · z∈[+1106,+1226]mm · 13 of 28 slices shown, 17 images]
[im 2/28  brain]
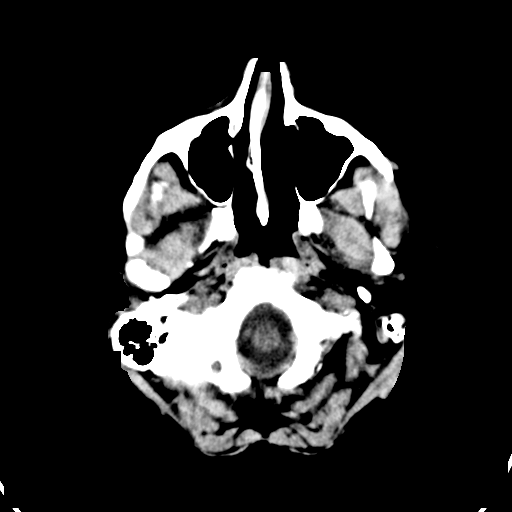
[im 2/28  bone]
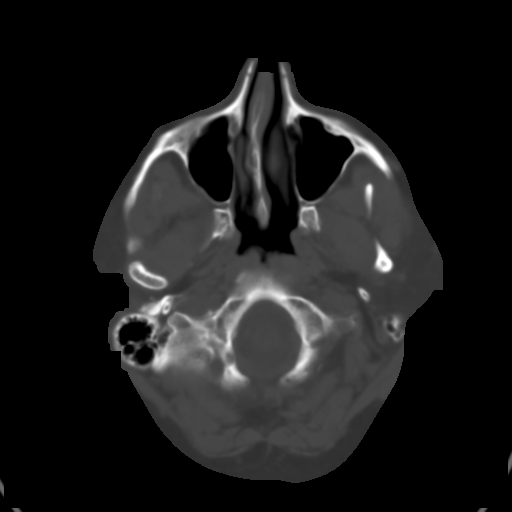
[im 4/28  brain]
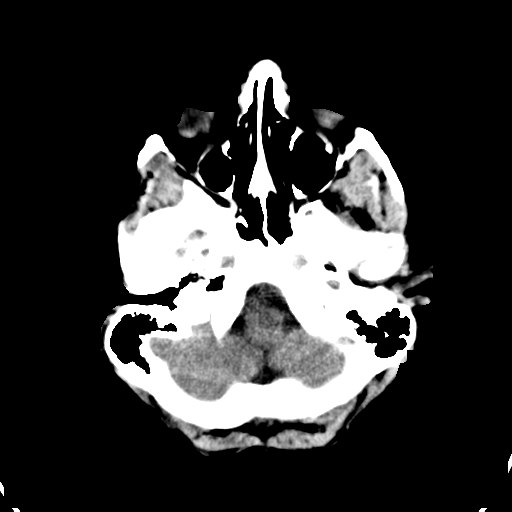
[im 6/28  brain]
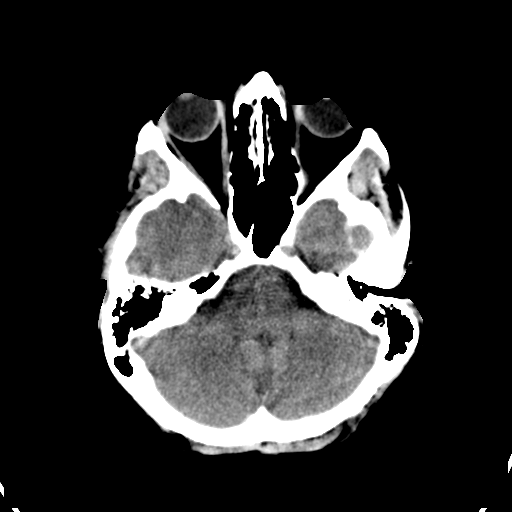
[im 8/28  brain]
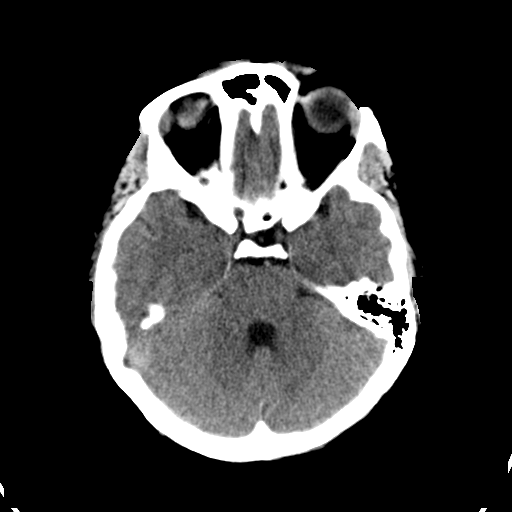
[im 10/28  brain]
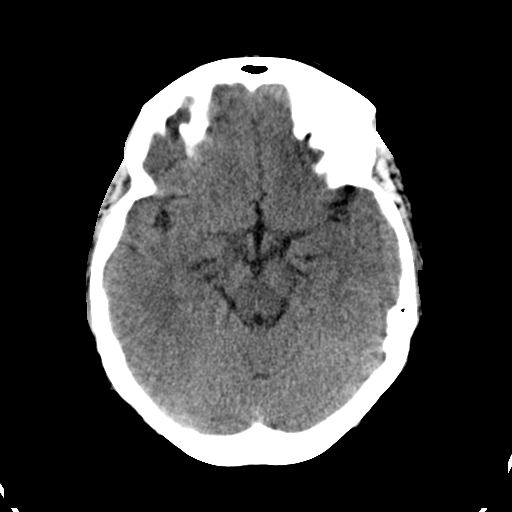
[im 10/28  bone]
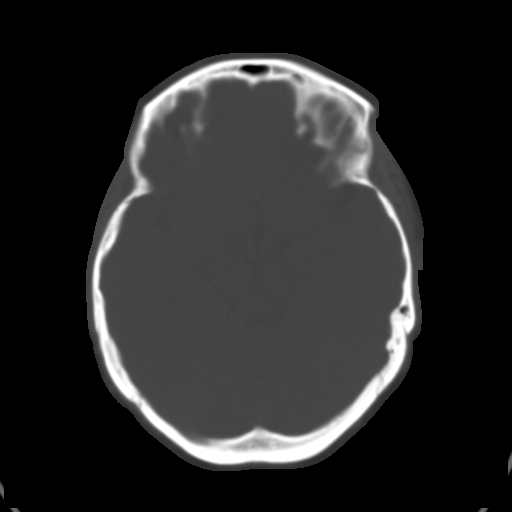
[im 12/28  brain]
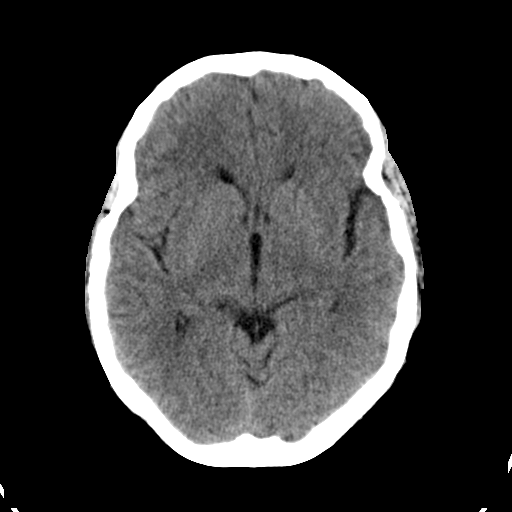
[im 14/28  brain]
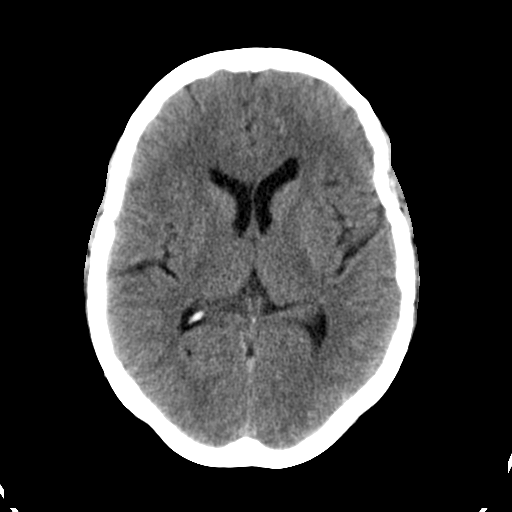
[im 16/28  brain]
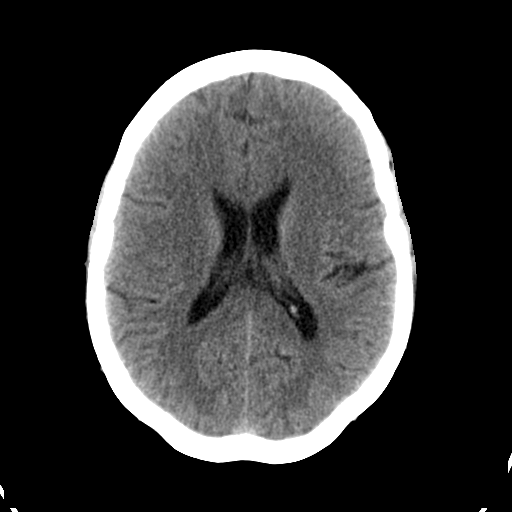
[im 18/28  brain]
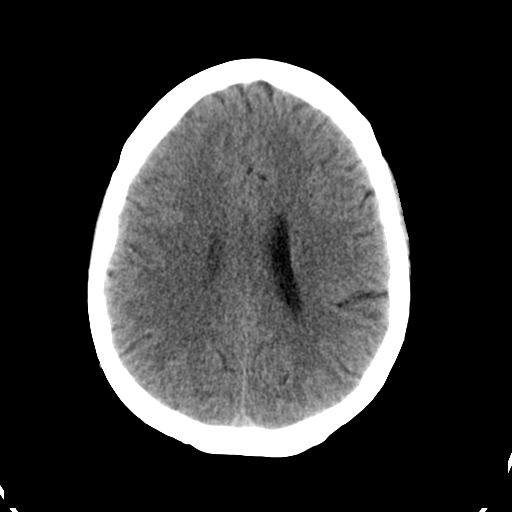
[im 18/28  bone]
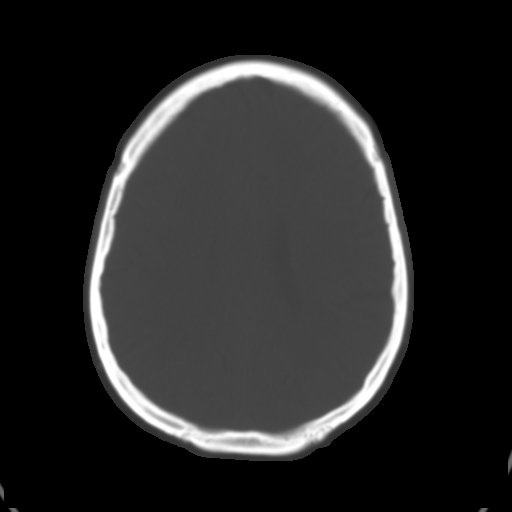
[im 20/28  brain]
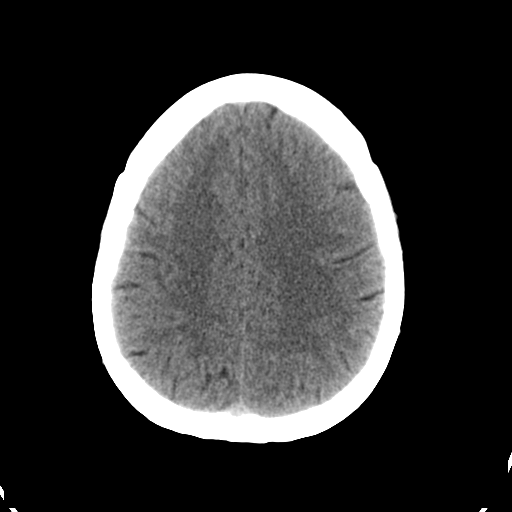
[im 22/28  brain]
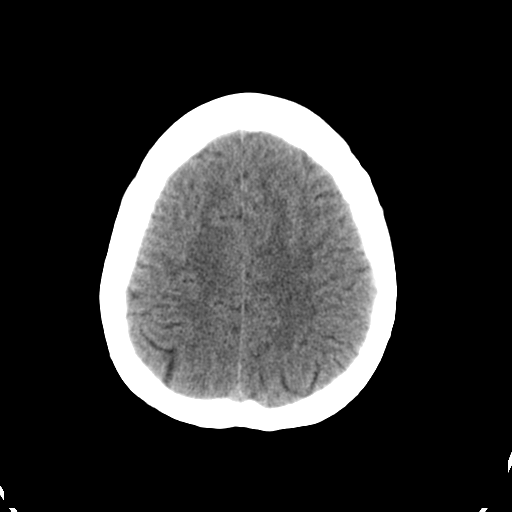
[im 24/28  brain]
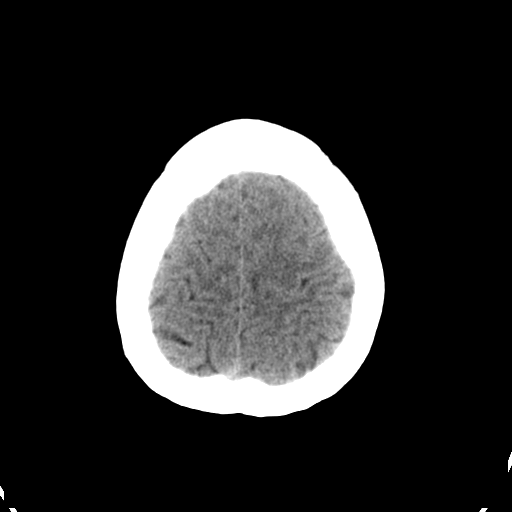
[im 26/28  brain]
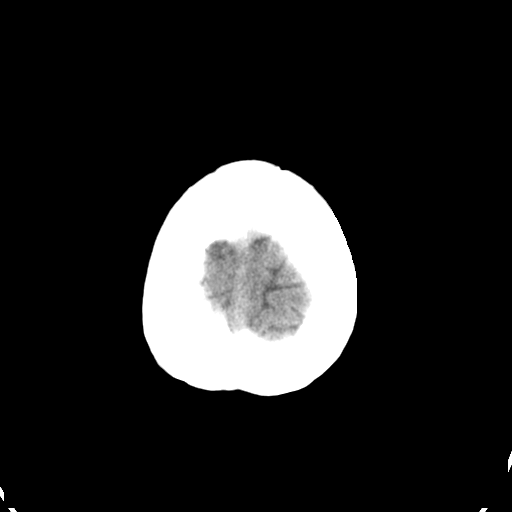
[im 26/28  bone]
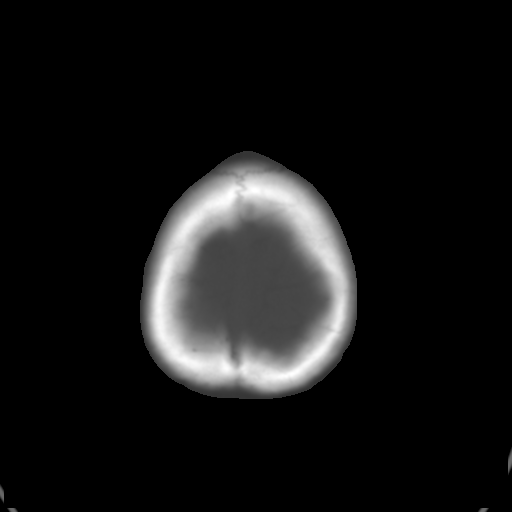

[Series 3: bone · axial · 0.40mm/px · z∈[+1106,+1146]mm · 3 of 28 slices shown]
[im 2/28  bone]
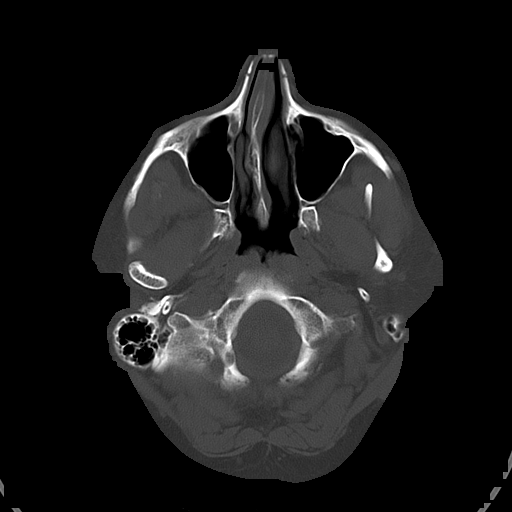
[im 6/28  bone]
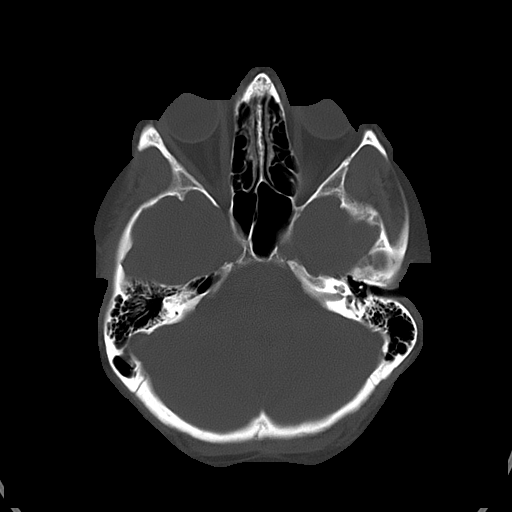
[im 10/28  bone]
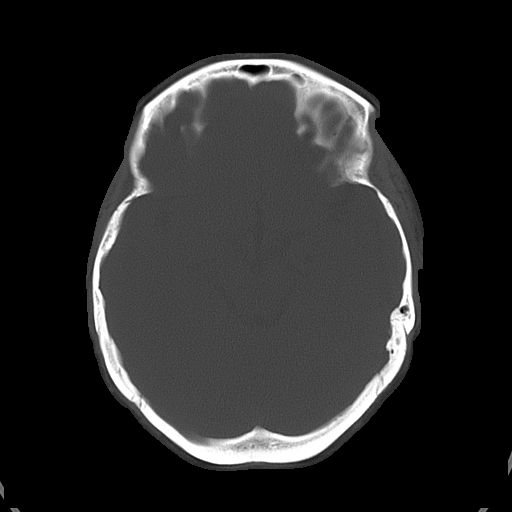

[16 of 30 positions shown; findings below may reference images not displayed]

FINDINGS: No intraaxial or extraaxial pathologic fluid or blood
collections are identified.  No mass lesion is noted. There is no
hydrocephalus.  No acute bony abnormality is identified.
IMPRESSION: No acute abnormality.

## 2009-05-06 IMAGING — CR DG FOOT COMPLETE 3+V*L*
1 series · 3 of 3 positions shown · non-contrast
Comparison: No comparison

REASON FOR EXAM: pain
COMMENTS:

PROCEDURE:     MDR - MDR FOOT LT COMP W/OBLQUES  - August 25, 2008  [DATE]
RESULT:     History: Pain

[Series 1: view not recorded · 0.17mm/px · 3 of 3 slices shown]
[im 1/3]
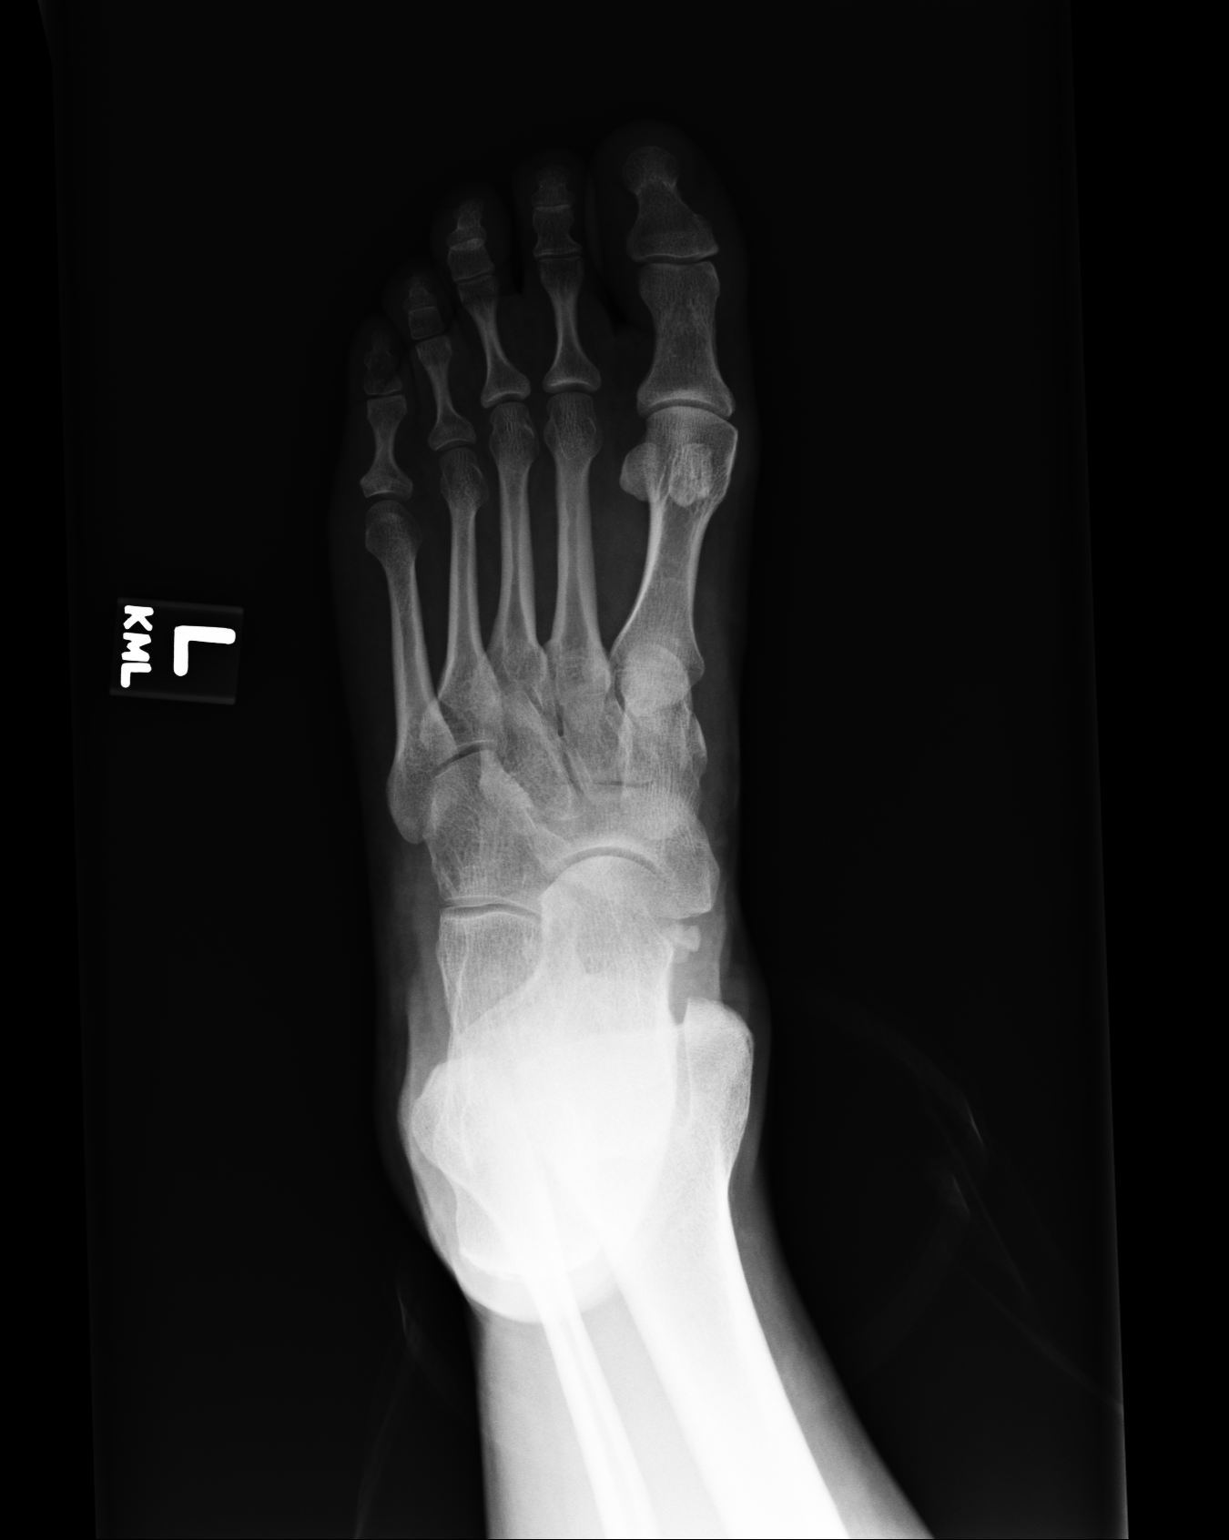
[im 2/3]
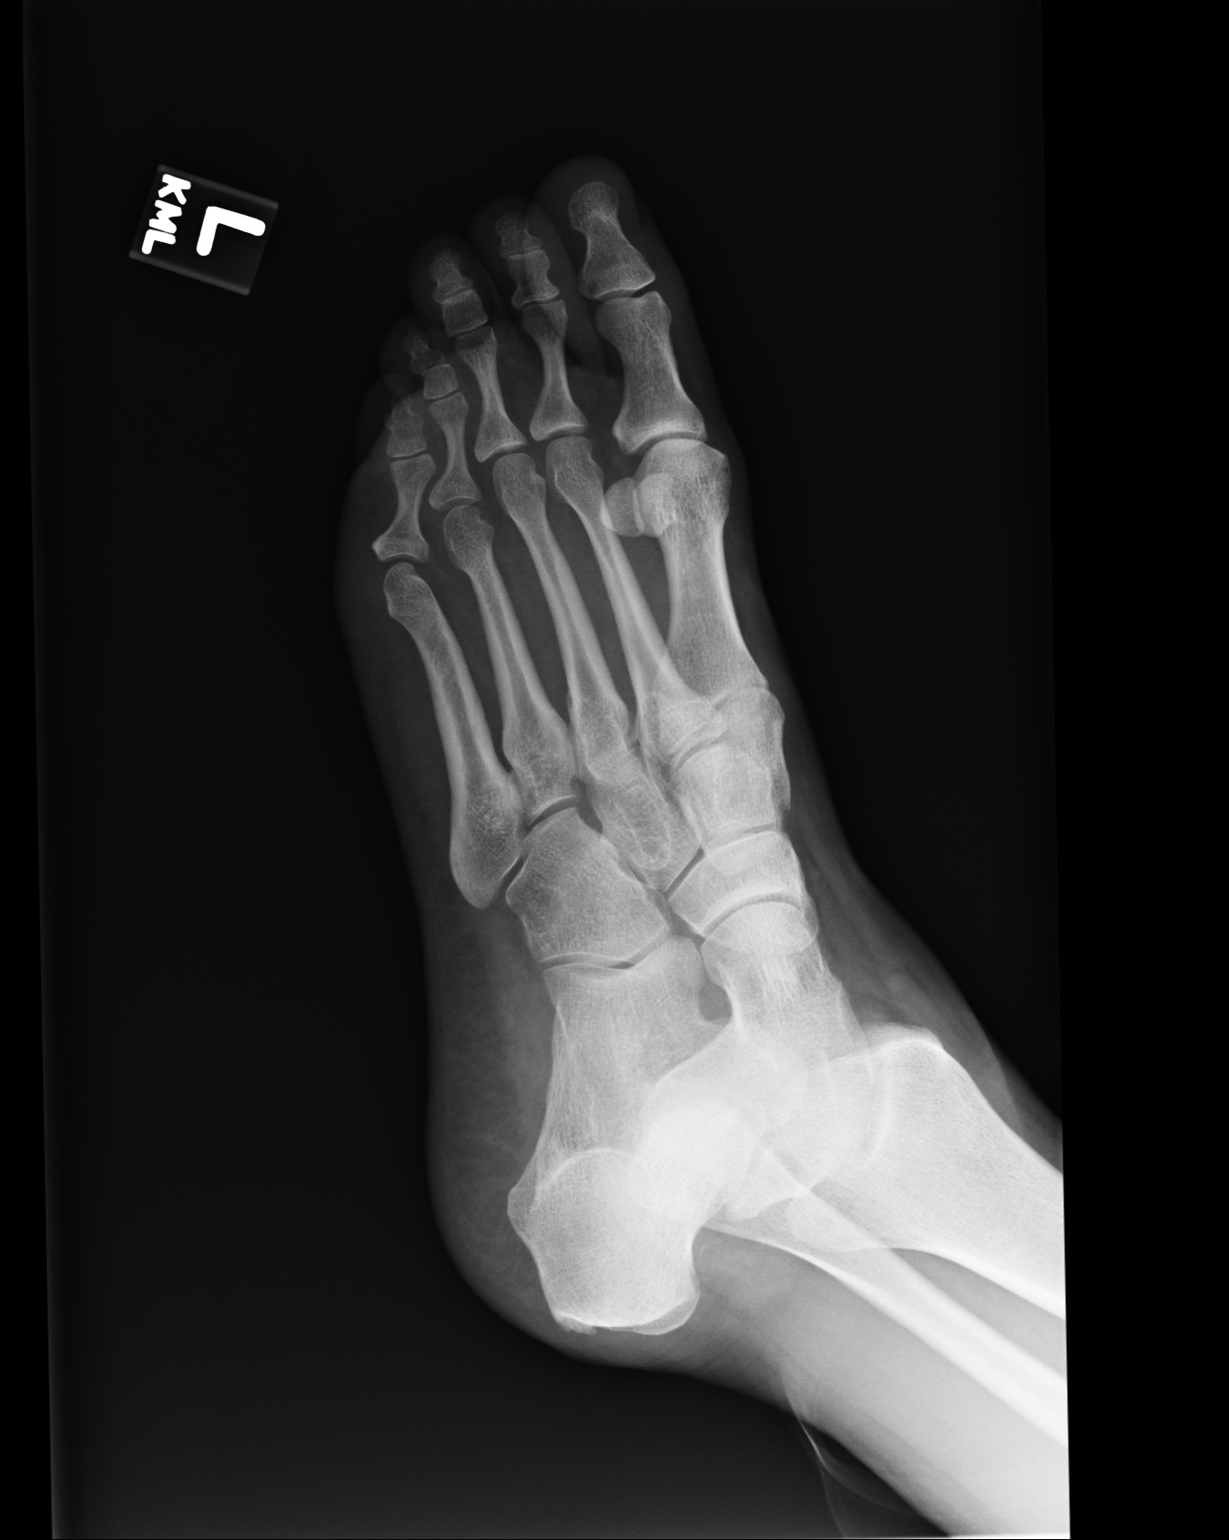
[im 3/3]
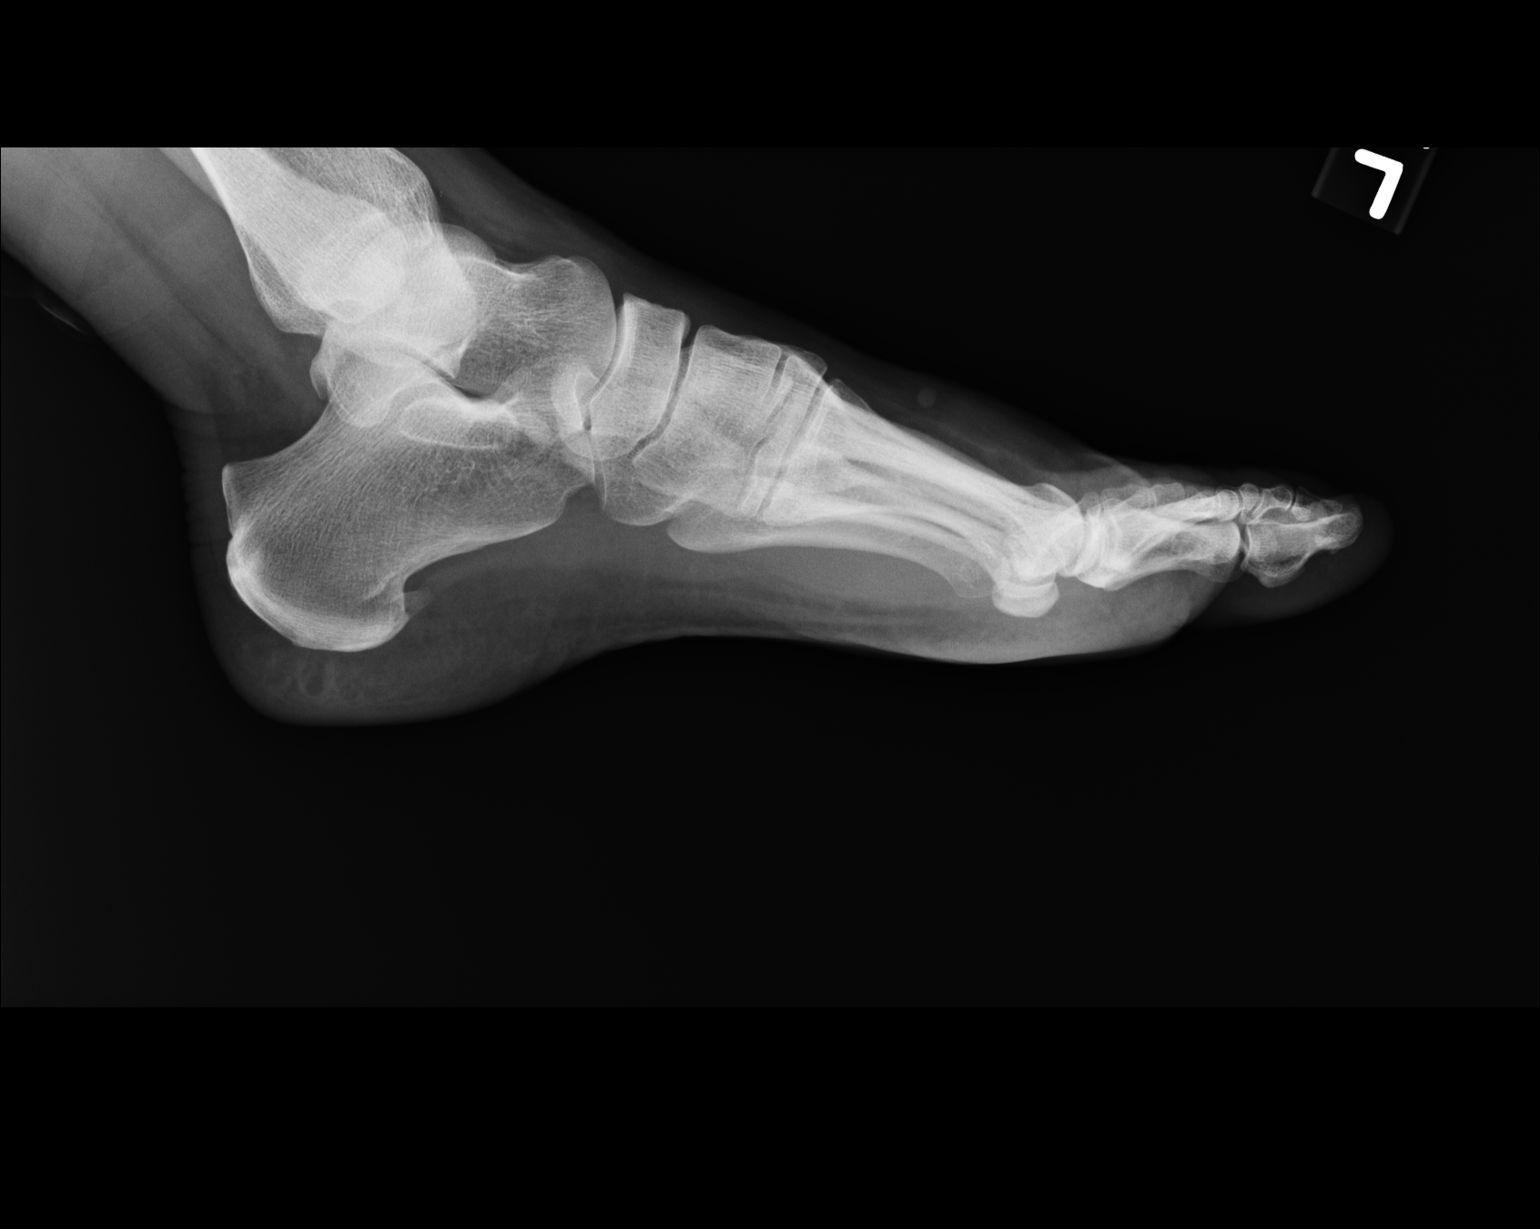

[3 of 3 positions shown; findings below may reference images not displayed]

FINDINGS: AP, oblique, and lateral views of the left foot demonstrates no fracture or
dislocation. Incidental note is made of an os naviculare. There is a plantar
calcaneal spur. There is no soft tissue abnormality. There is no
subcutaneous emphysema or radiopaque foreign bodies.
IMPRESSION: No acute osseous injury of the left foot.

## 2009-05-09 ENCOUNTER — Emergency Department: Payer: Self-pay | Admitting: Unknown Physician Specialty
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
## 2009-05-26 ENCOUNTER — Emergency Department: Payer: Self-pay | Admitting: Emergency Medicine

## 2009-06-30 IMAGING — CR DG FOOT COMPLETE 3+V*L*
1 series · 3 of 3 positions shown · non-contrast
Comparison: none

REASON FOR EXAM: fall, pain
COMMENTS:

PROCEDURE:     MDR - MDR FOOT LT COMP W/OBLQUES  - October 19, 2008  [DATE]
RESULT:     Comparison is made to a prior exam of 08/25/2008. No fracture,
dislocation or other acute bony abnormality is identified. Incidentally
noted is a plantar calcaneal spur.

[Series 1: view not recorded · 0.17mm/px · 3 of 3 slices shown]
[im 1/3]
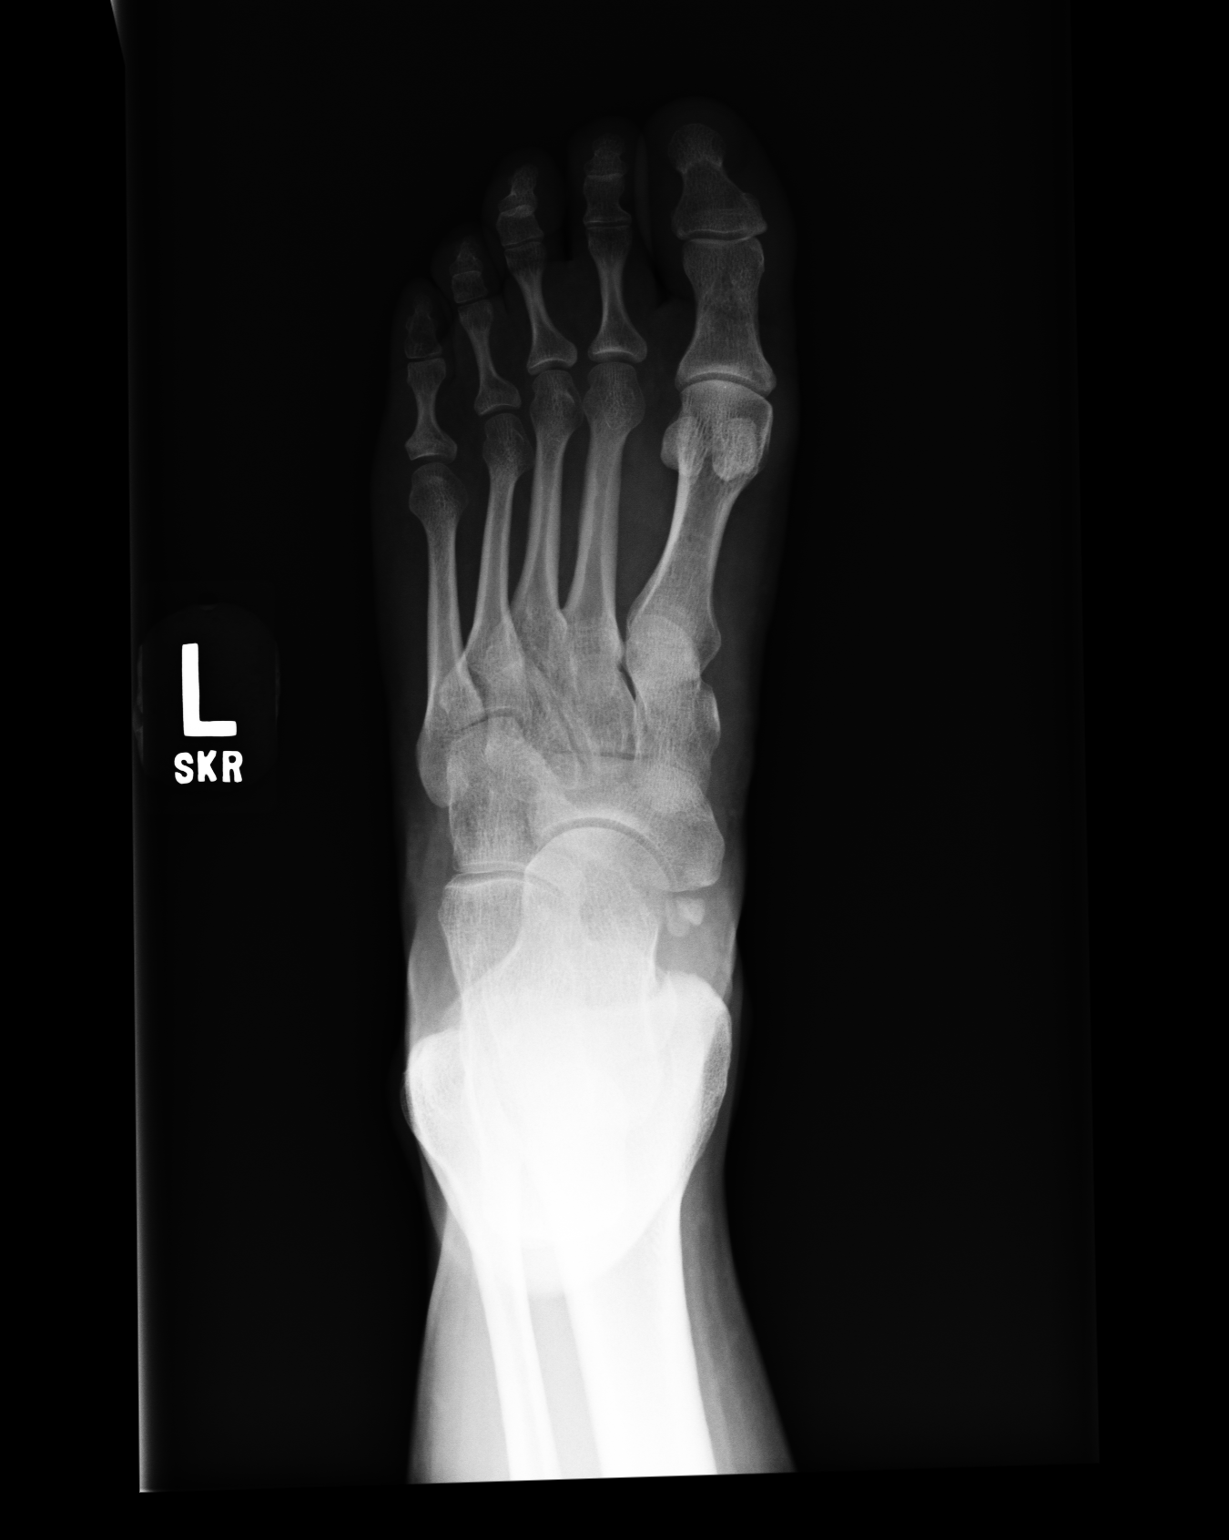
[im 2/3]
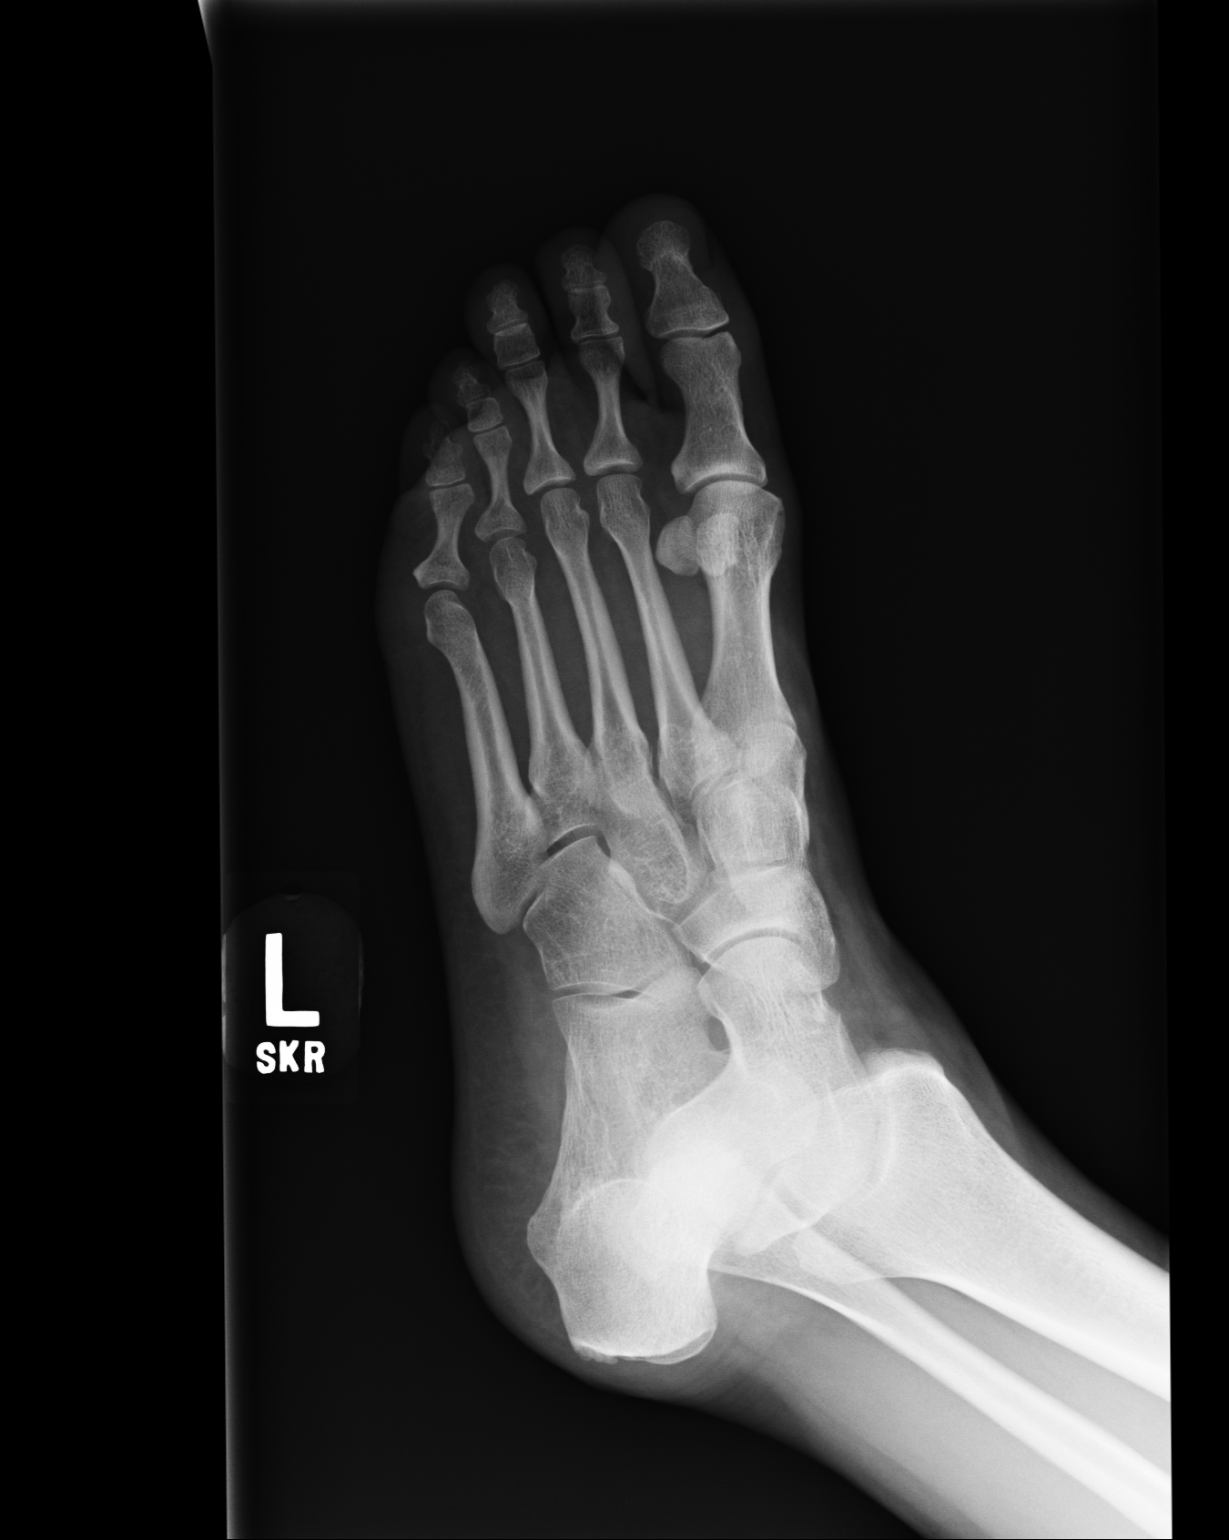
[im 3/3]
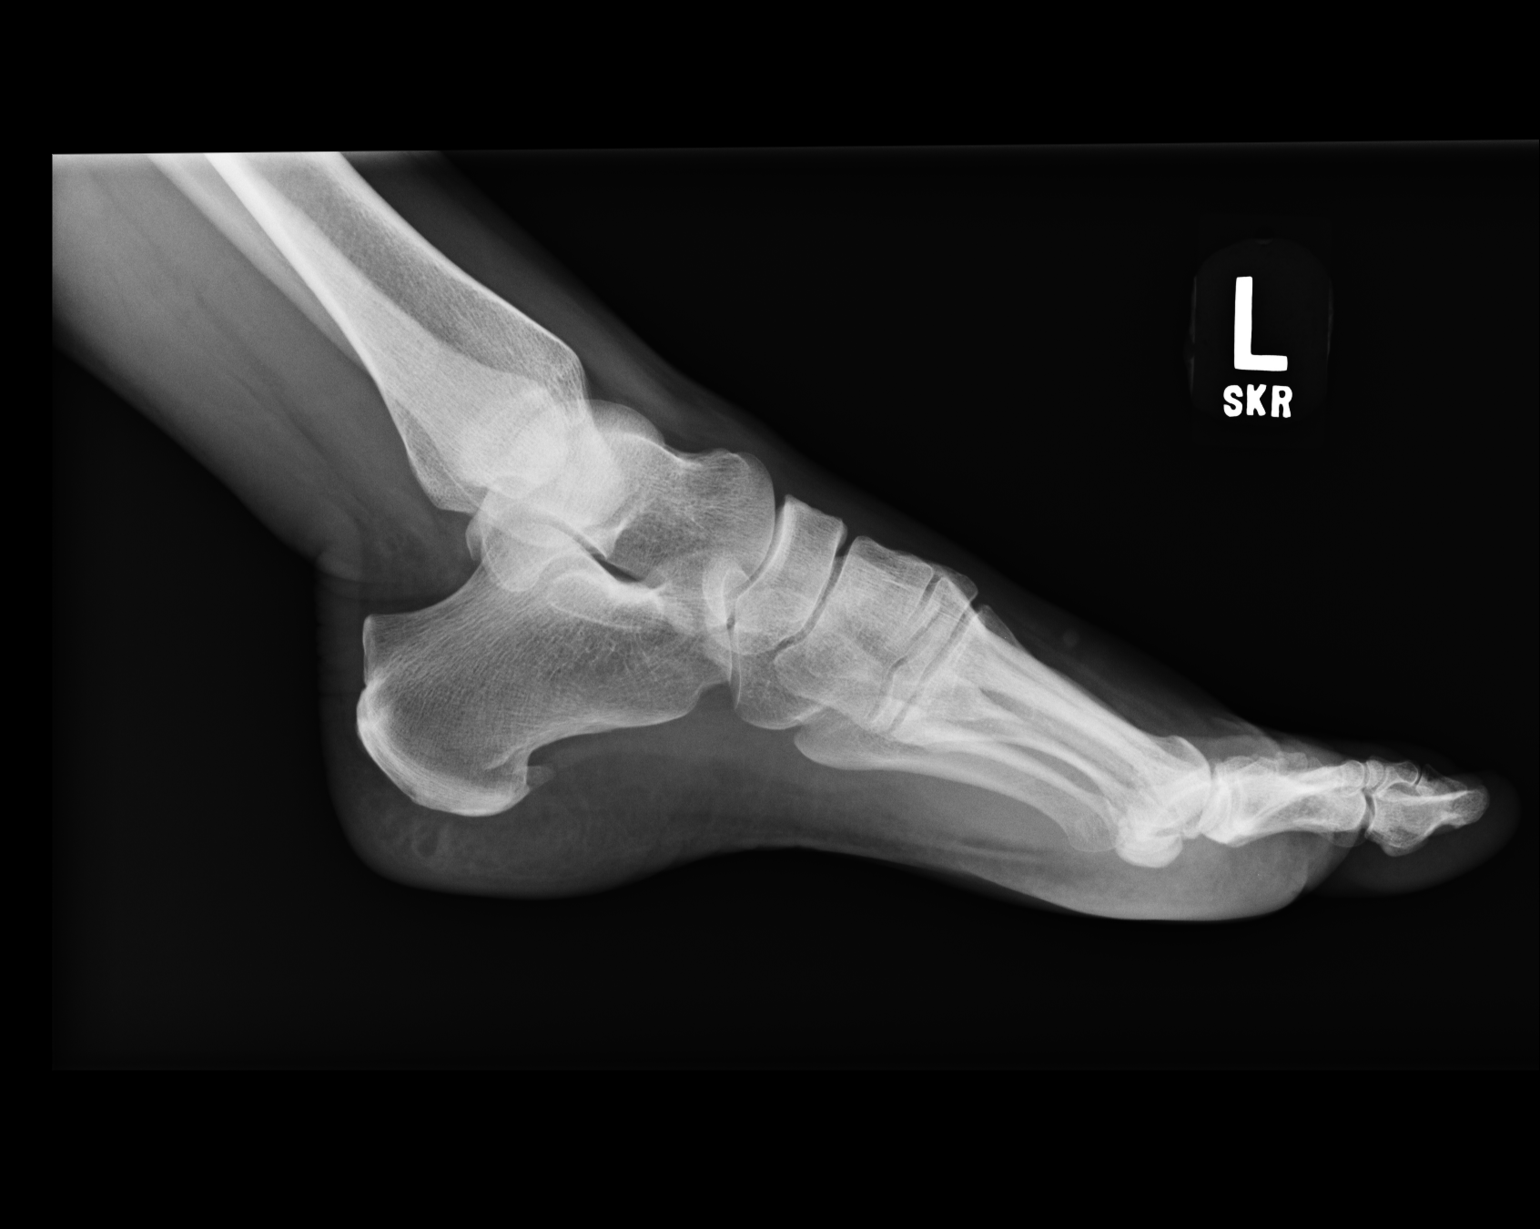

[3 of 3 positions shown; findings below may reference images not displayed]

IMPRESSION: 1. No fracture or dislocation is seen.
2. Incidental note is made of a plantar calcaneal spur.

## 2009-07-14 IMAGING — CR DG ELBOW COMPLETE 3+V*L*
1 series · 4 of 4 positions shown · non-contrast
Comparison: none

REASON FOR EXAM: Fell out of bath tub
COMMENTS:

[Series 1: view not recorded · 0.17mm/px · 4 of 4 slices shown]
[im 1/4]
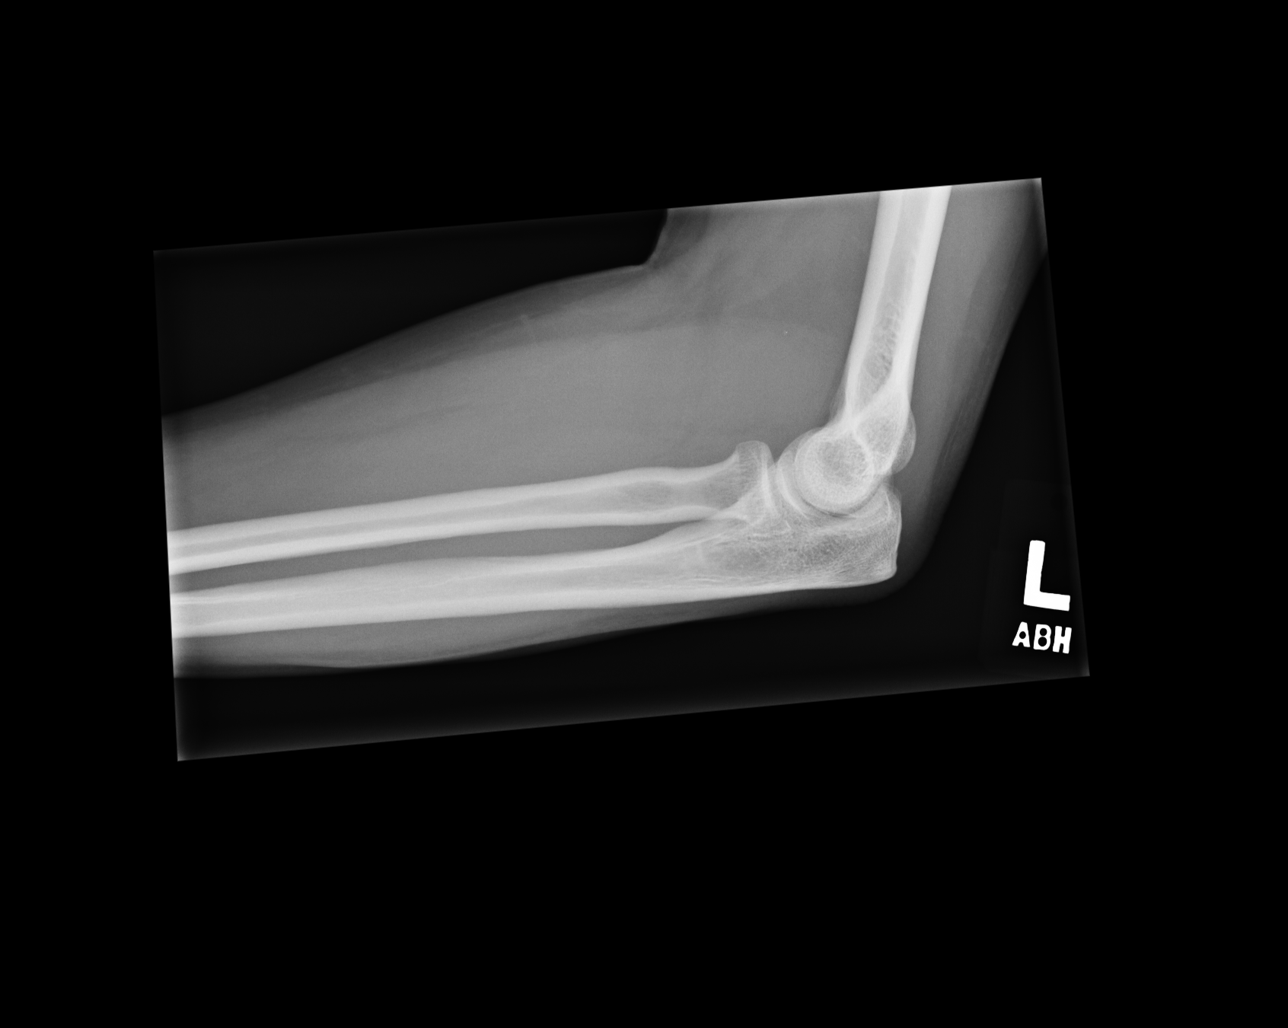
[im 2/4]
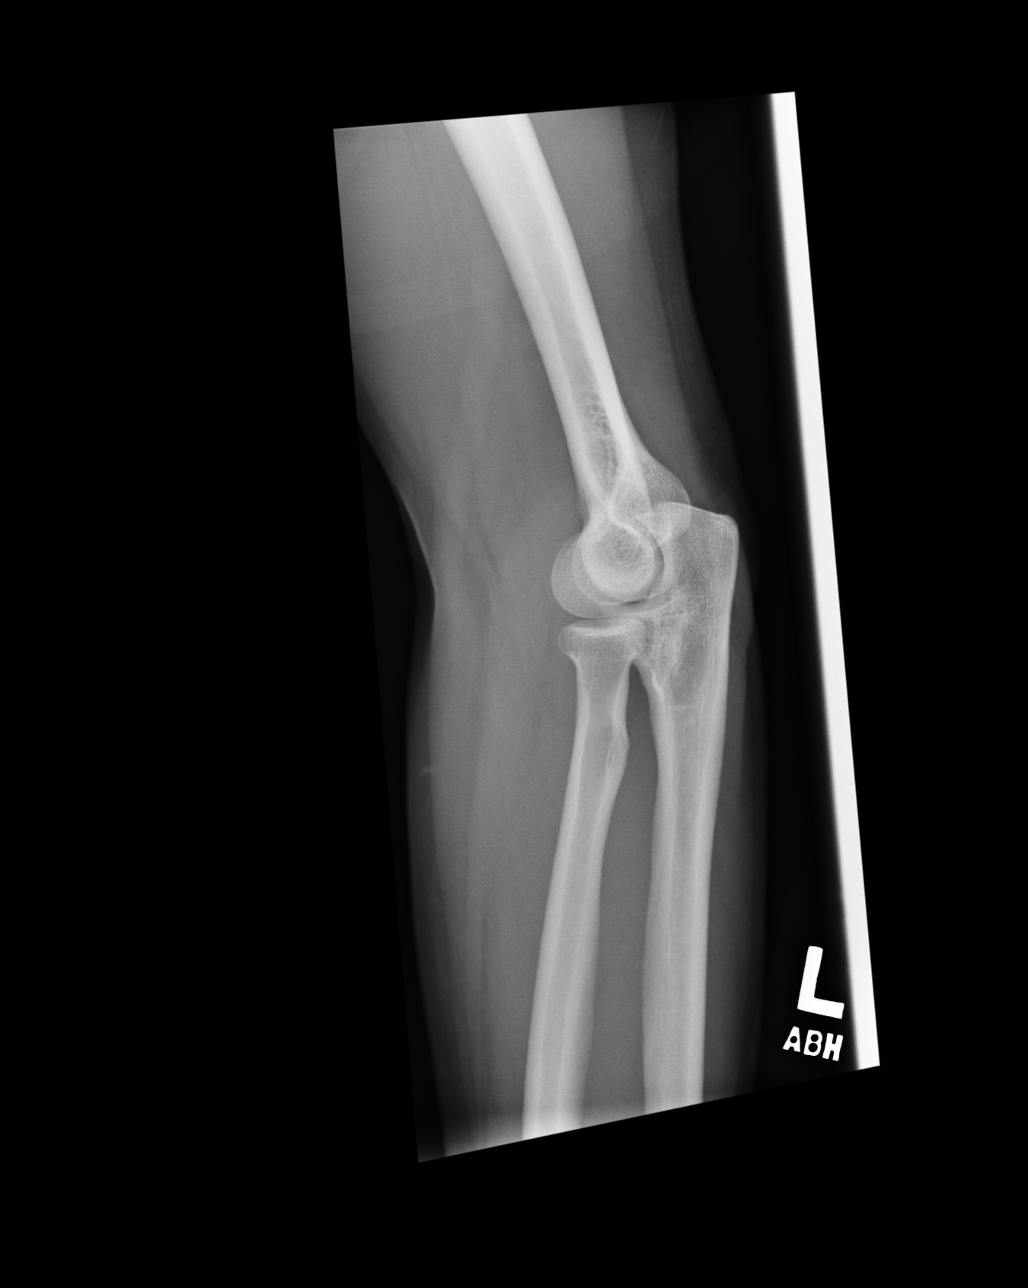
[im 3/4]
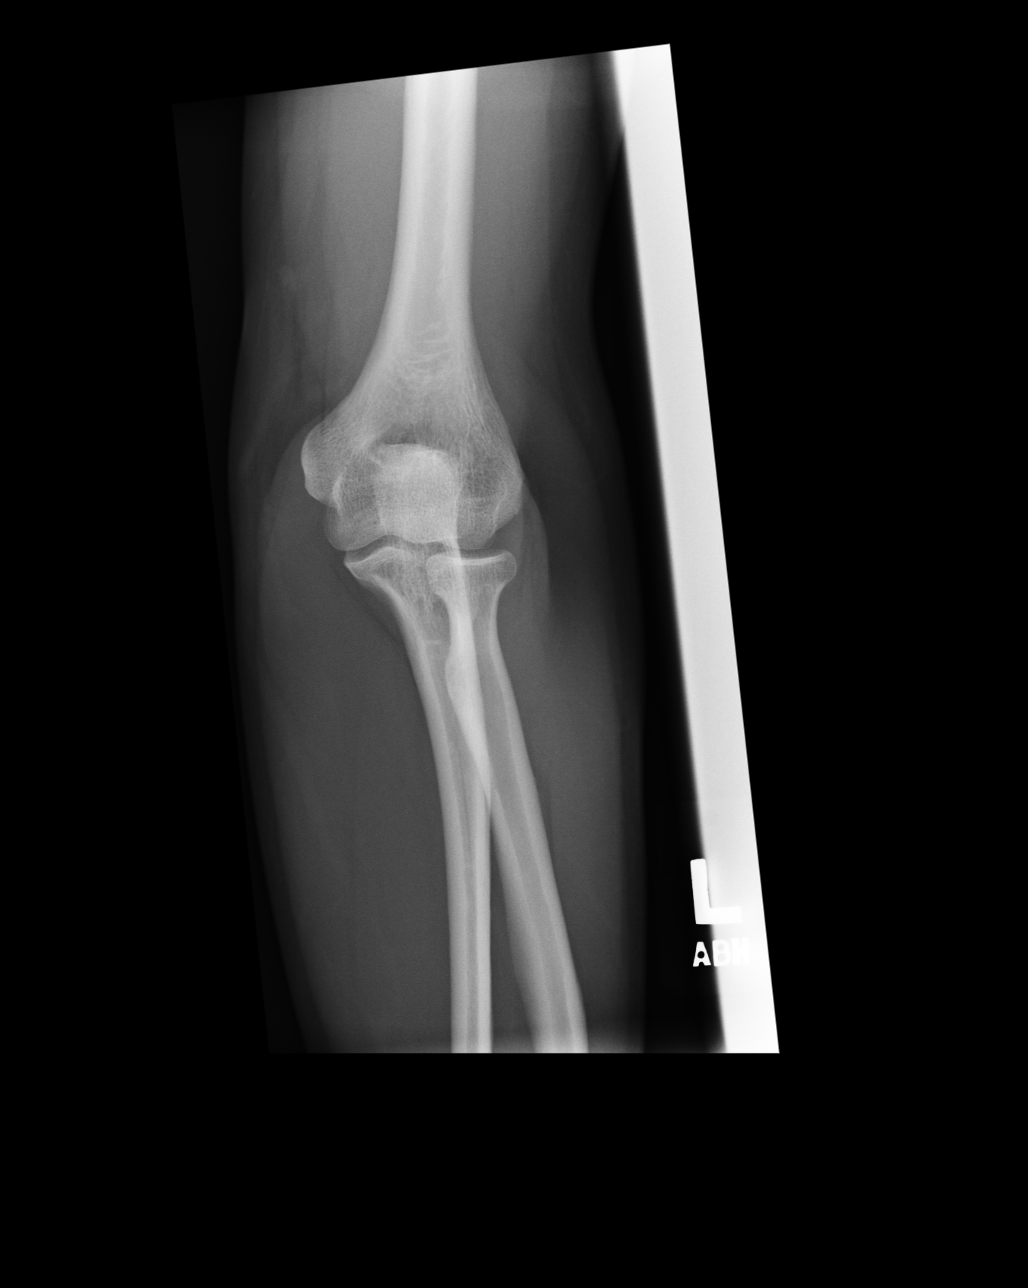
[im 4/4]
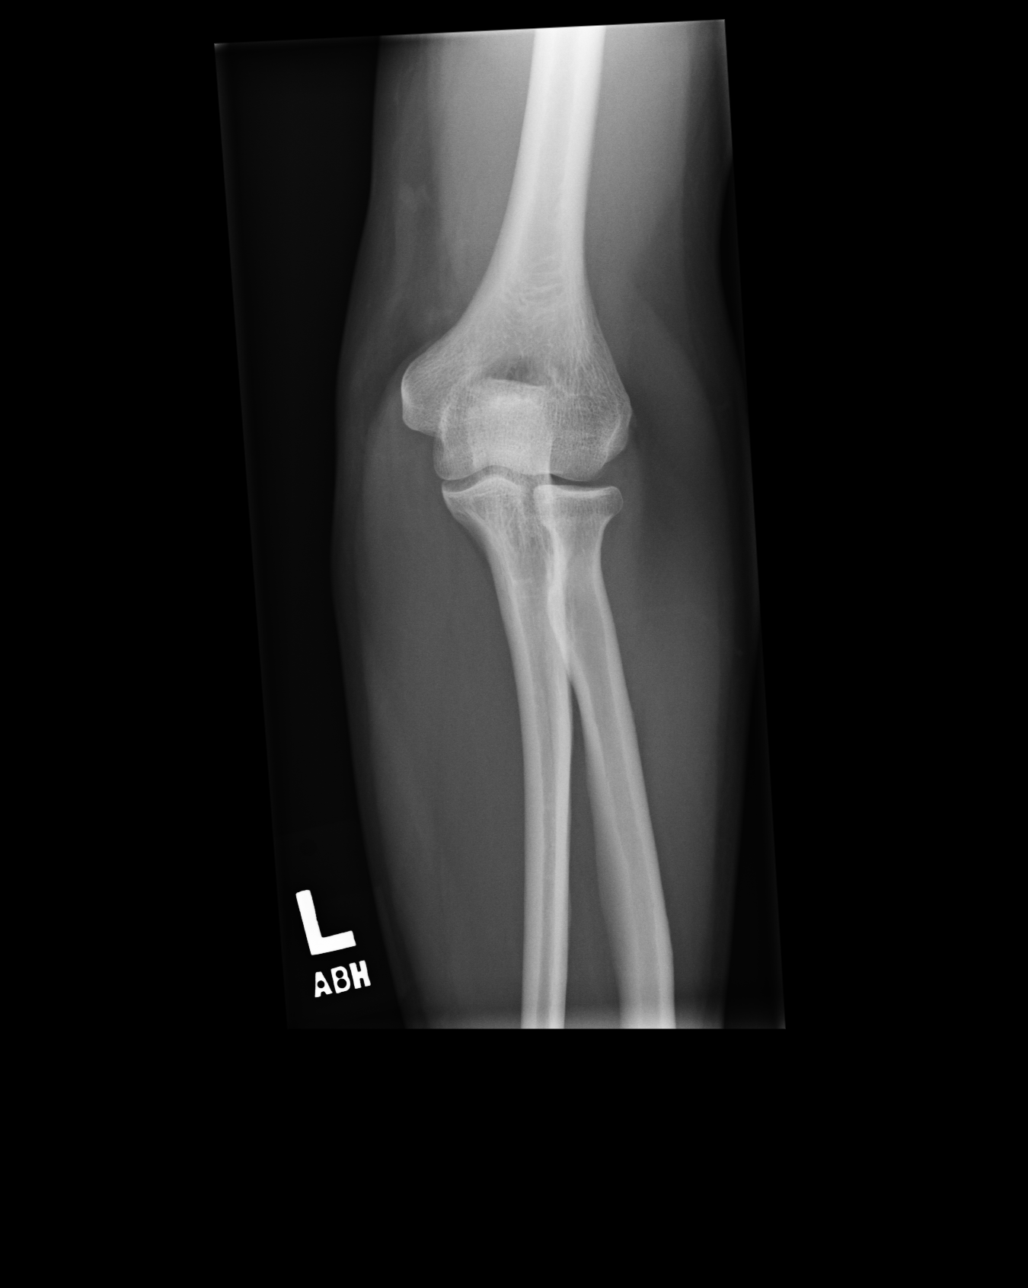

[4 of 4 positions shown; findings below may reference images not displayed]

PROCEDURE:     MDR - MDR ELBOW LT COMP W/OBLIQUES  - September 09, 2007  [DATE]

RESULT:     Four views of the LEFT elbow reveal the bones to be adequately
mineralized. I do not see evidence of an acute fracture. The overlying soft
tissues are normal in appearance. No definite evidence of a joint effusion
is seen. Specific attention to the radial head reveals no acute fracture.
IMPRESSION: I see no acute bony abnormality of the LEFT elbow.

## 2009-07-20 ENCOUNTER — Ambulatory Visit: Payer: Self-pay | Admitting: Obstetrics & Gynecology

## 2009-09-26 ENCOUNTER — Ambulatory Visit: Payer: Self-pay | Admitting: Family Medicine

## 2009-09-27 IMAGING — CR RIGHT FOOT COMPLETE - 3+ VIEW
1 series · 3 of 3 positions shown · non-contrast
Comparison: None

REASON FOR EXAM: pain from fall
COMMENTS:

PROCEDURE:     MDR - MDR FOOT RT COMP W/OBLIQUES  - November 23, 2007 [DATE]
RESULT:     History: 37-year-old female status post fall

[Series 1: view not recorded · 0.17mm/px · 3 of 3 slices shown]
[im 1/3]
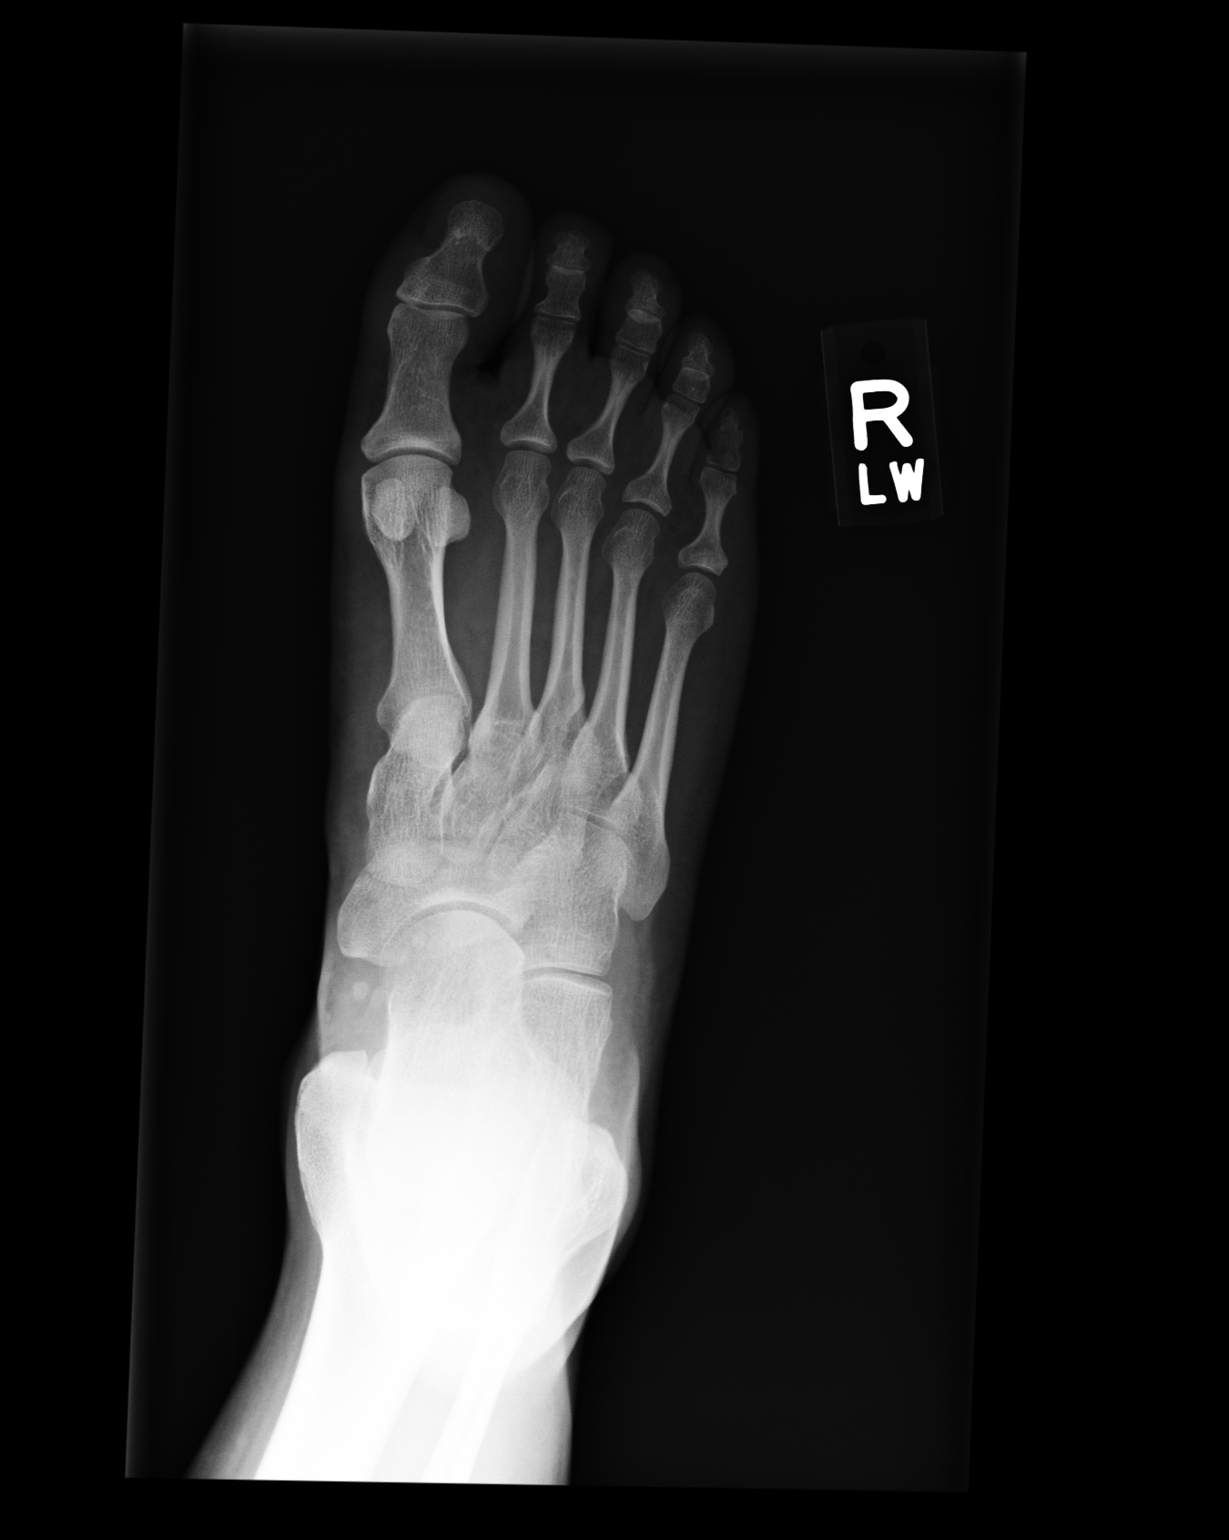
[im 2/3]
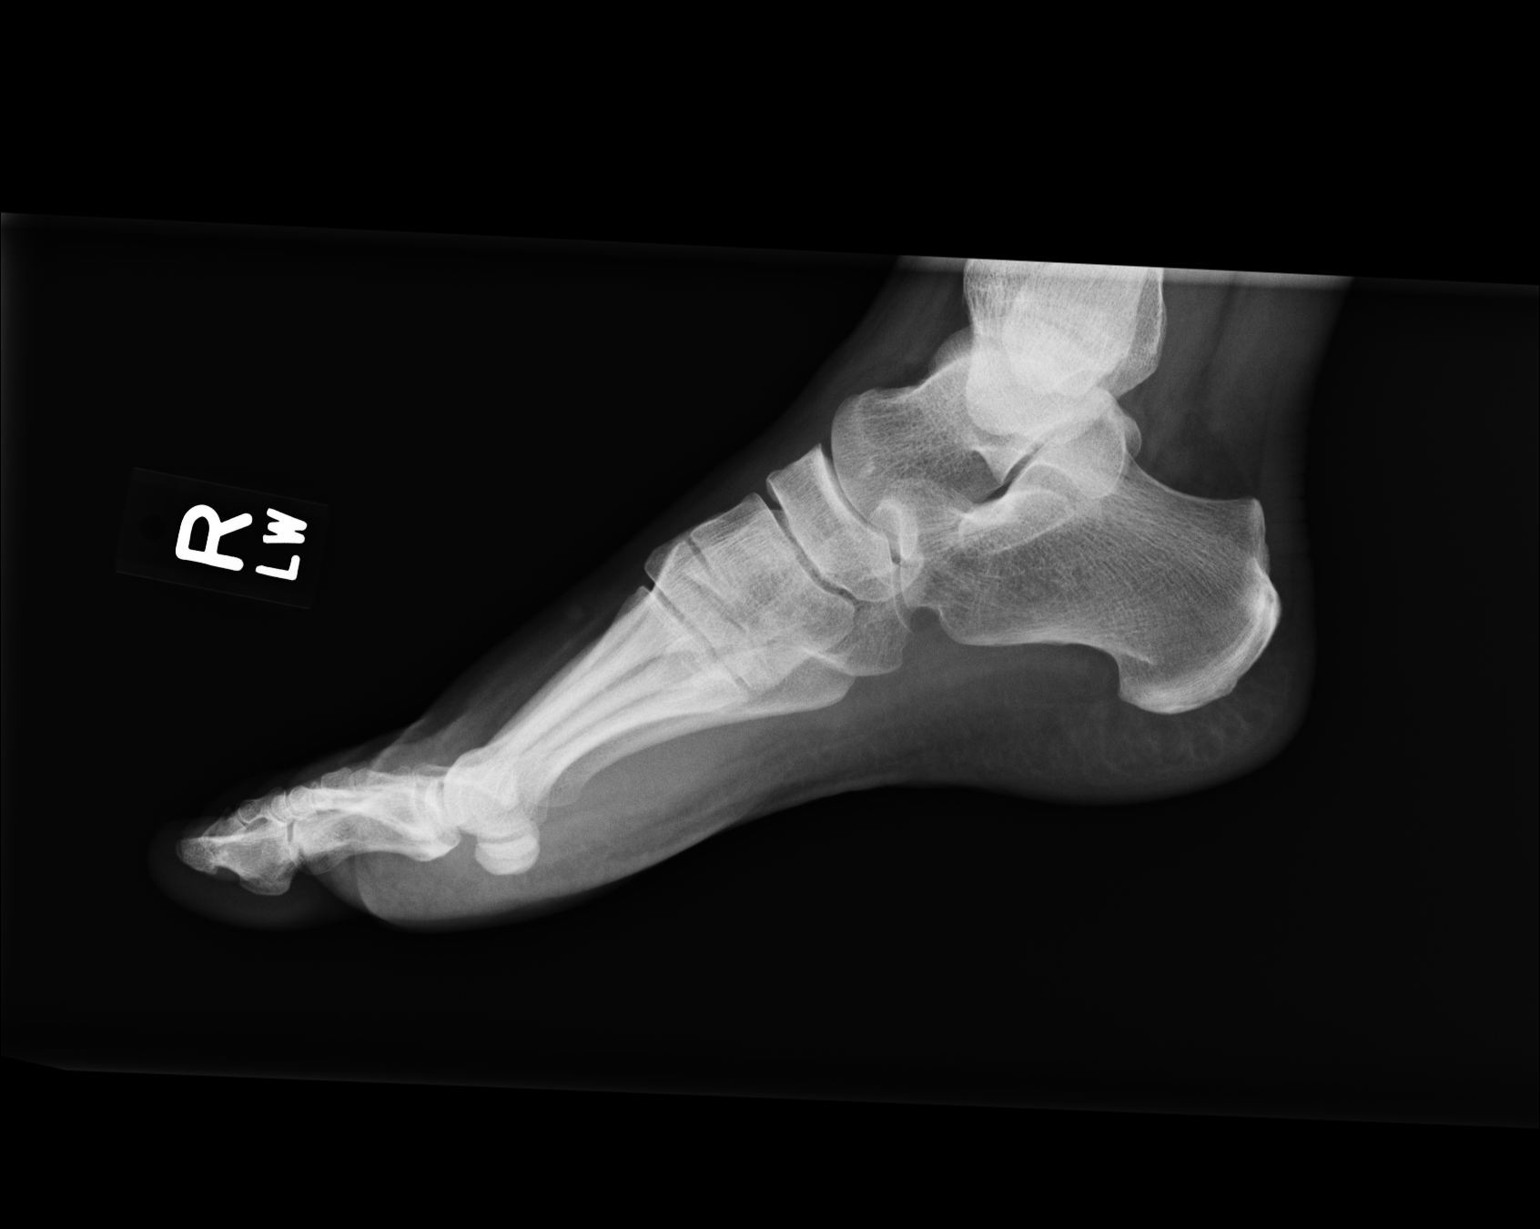
[im 3/3]
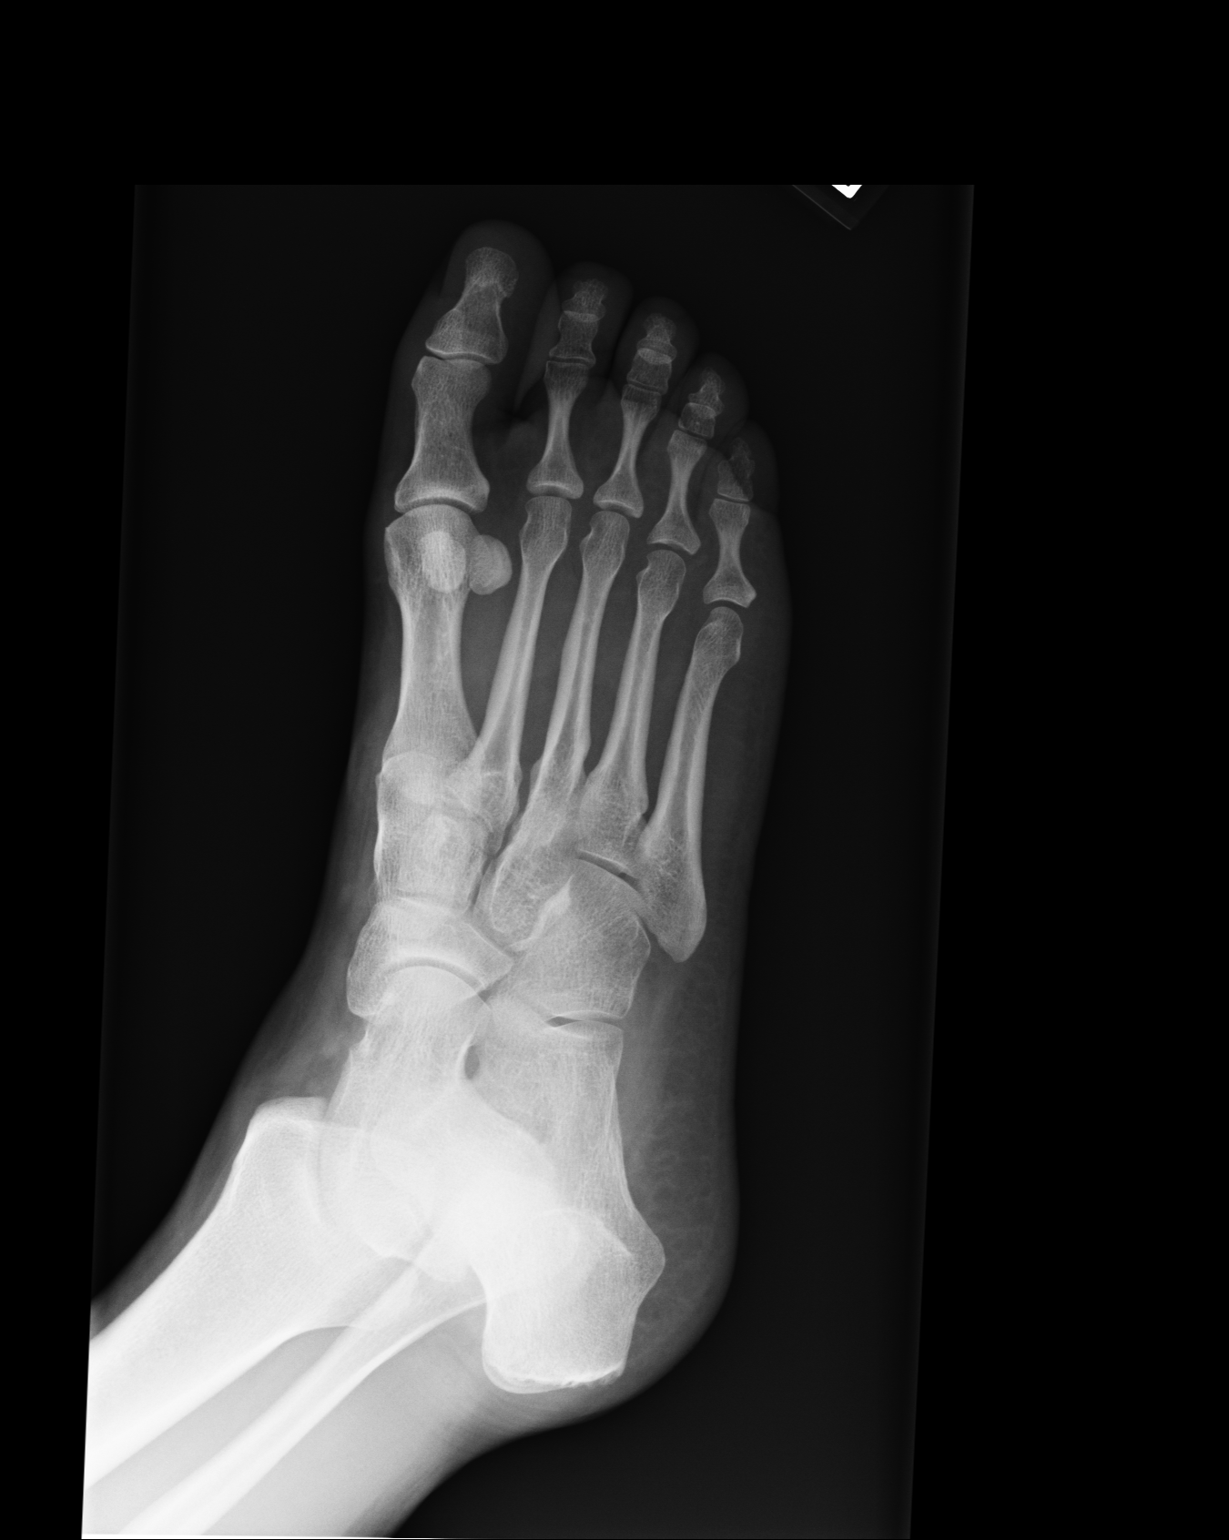

[3 of 3 positions shown; findings below may reference images not displayed]

FINDINGS: AP, oblique, and lateral views of the right foot demonstrate no fracture or
dislocation. There is no soft tissue abnormality. There is no subcutaneous
emphysema or radiopaque foreign bodies.
IMPRESSION: No acute osseous injury of the right foot.

## 2009-10-01 IMAGING — US ULTRASOUND LEFT BREAST
1 series · 17 of 25 positions shown · non-contrast
Comparison: none

REASON FOR EXAM: LT breast  tender nodule at 22oclock just above the
nipple
COMMENTS:

[Series 1: ultrasound left breast · 17 of 26 slices shown]
[im 1/26]
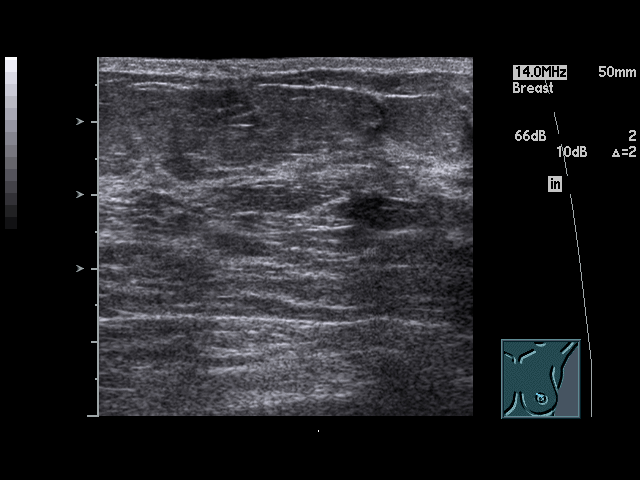
[im 3/26]
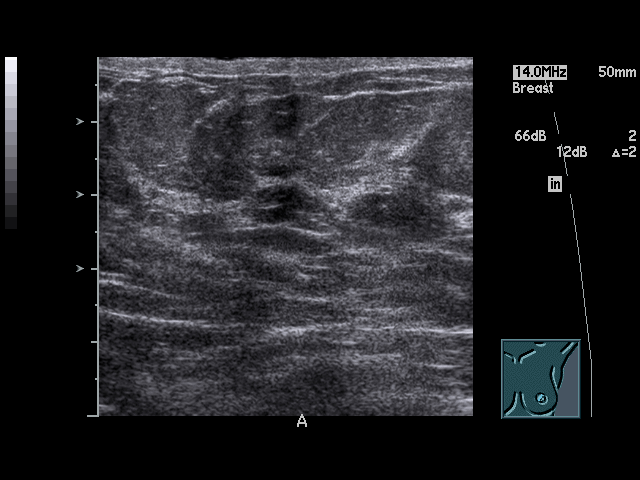
[im 4/26]
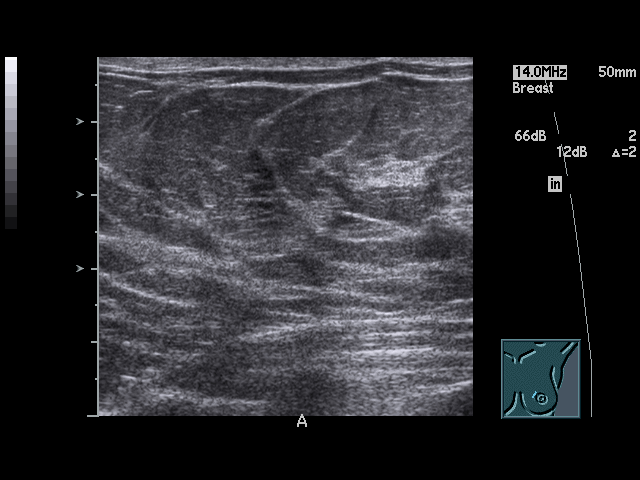
[im 6/26]
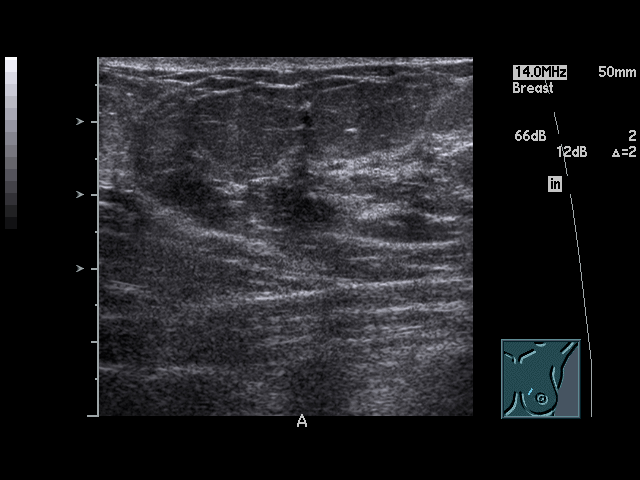
[im 7/26]
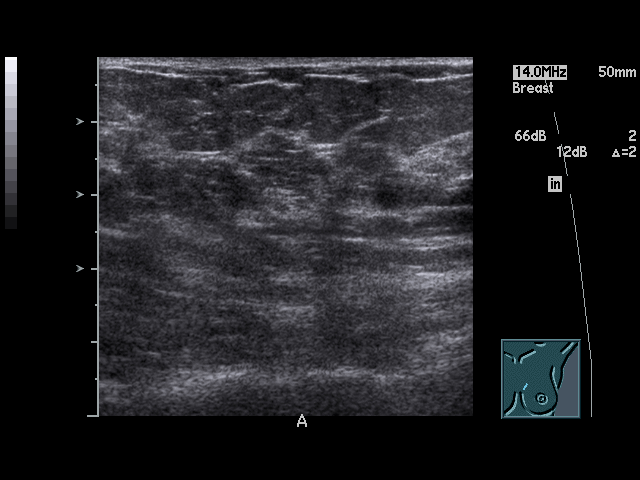
[im 9/26]
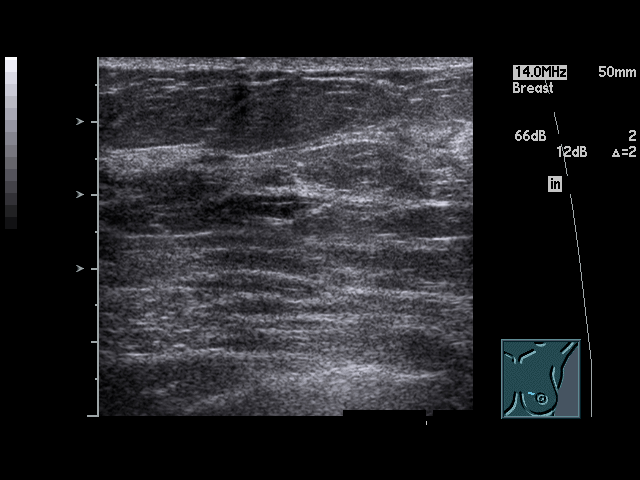
[im 10/26]
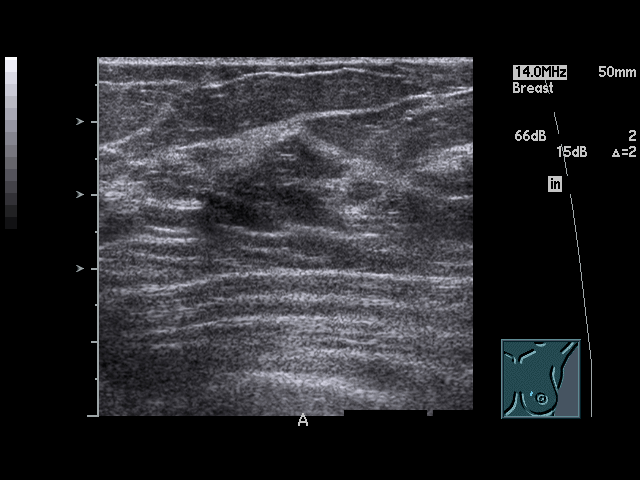
[im 12/26]
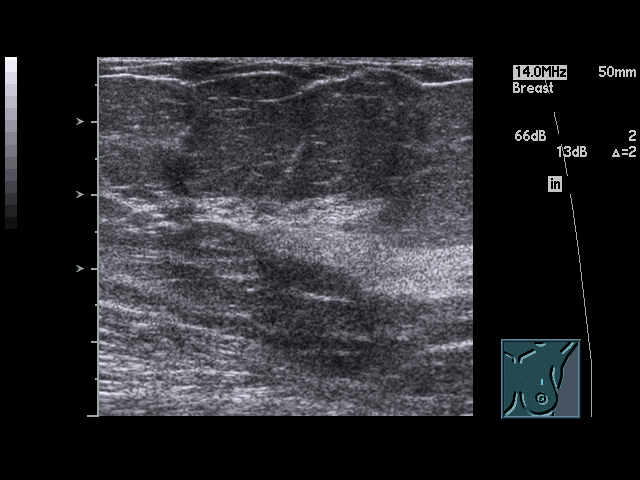
[im 13/26]
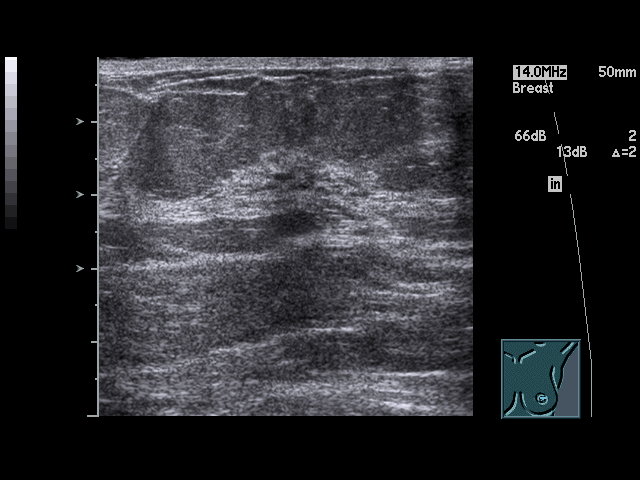
[im 14/26]
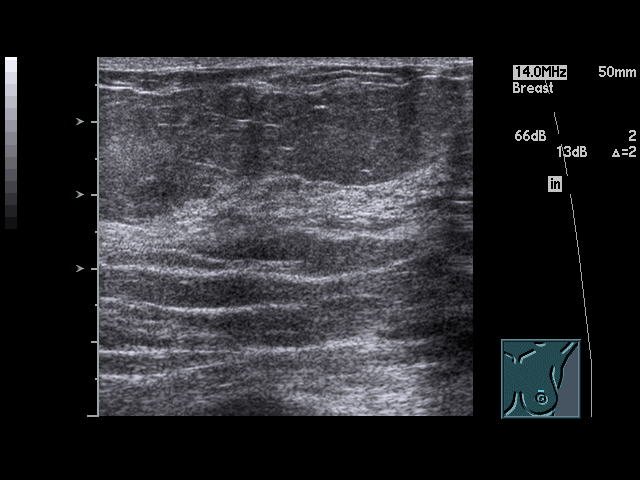
[im 16/26]
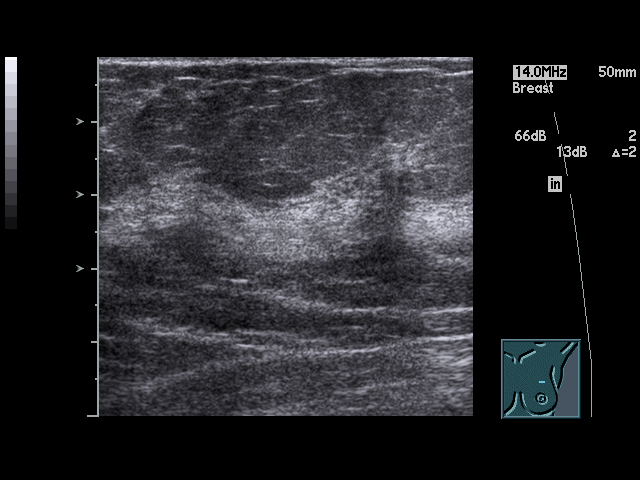
[im 17/26]
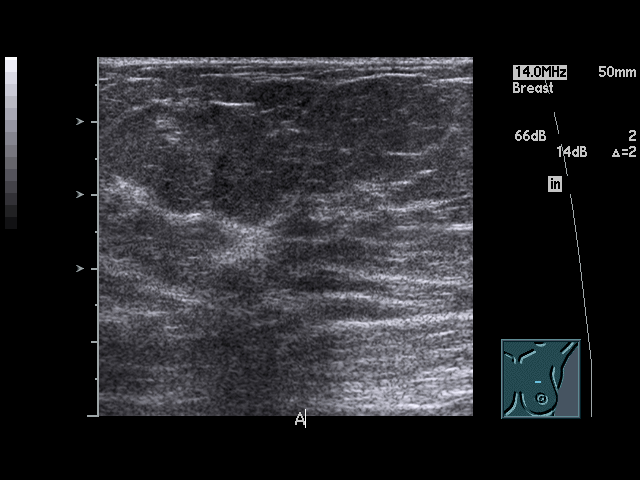
[im 19/26]
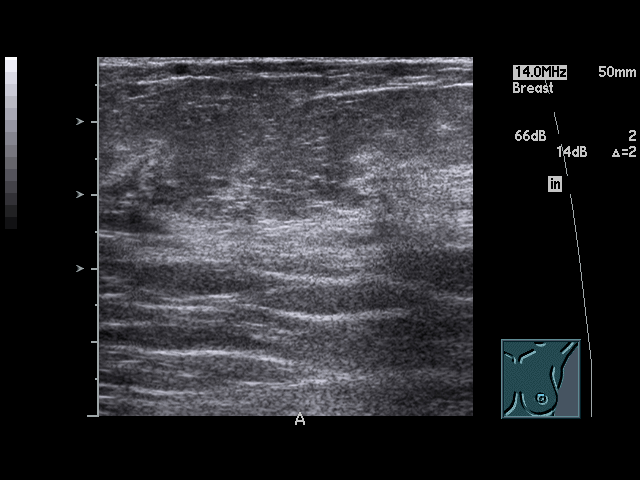
[im 20/26]
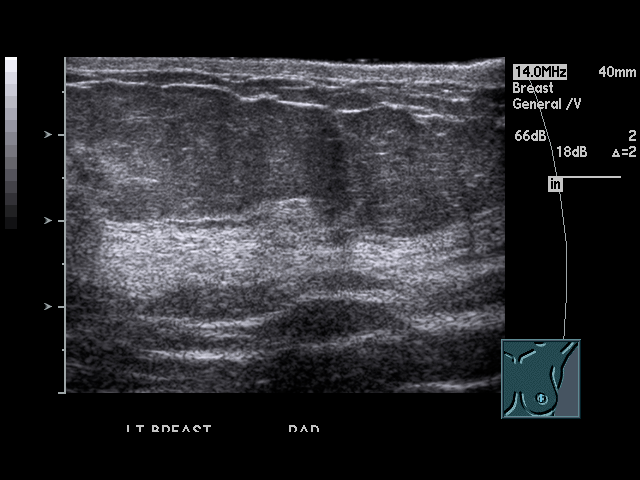
[im 22/26]
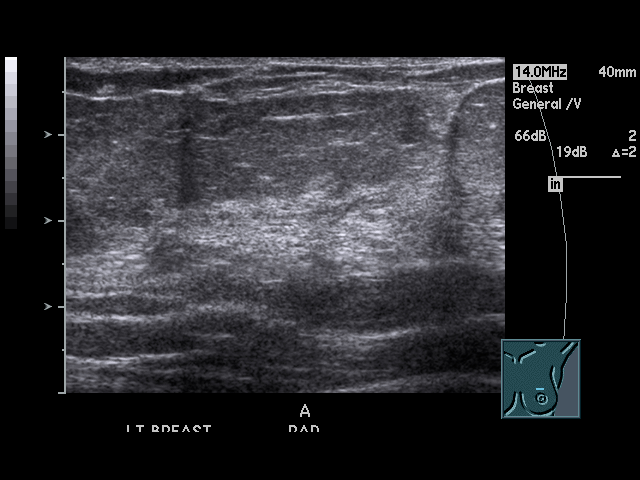
[im 23/26]
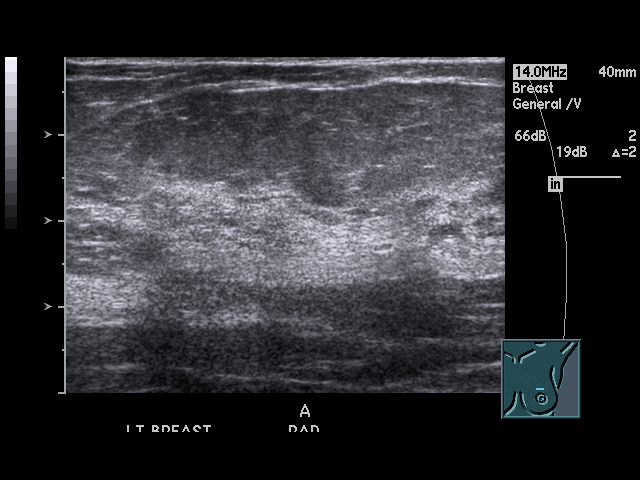
[im 26/26]
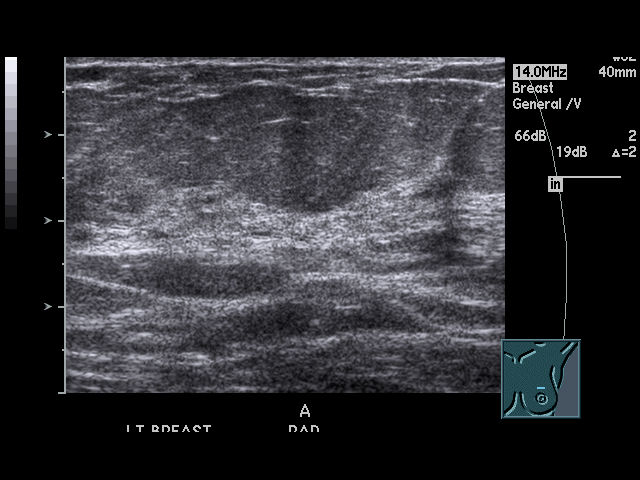

[17 of 25 positions shown; findings below may reference images not displayed]

PROCEDURE:     US  - US BREAST LEFT  - January 20, 2009  [DATE]

RESULT:     This report is incorporated into the mammography report for the
exam of 01-20-09.  In summary, the patient has palpable areas of concern at 10
o'clock and 12 o'clock. Sonographic evaluation of these sites reveals no
significant sonographic abnormalities.
IMPRESSION: 1.No significant abnormalities are noted.

## 2009-10-02 ENCOUNTER — Ambulatory Visit: Payer: Self-pay | Admitting: Internal Medicine

## 2009-10-08 IMAGING — US ABDOMEN ULTRASOUND
1 series · 17 of 25 positions shown · non-contrast
Comparison: none

REASON FOR EXAM: pain, R UQ - eval for biliary disease
COMMENTS:

[Series 1: abdomen ultrasound · 17 of 133 slices shown]
[im 1/133]
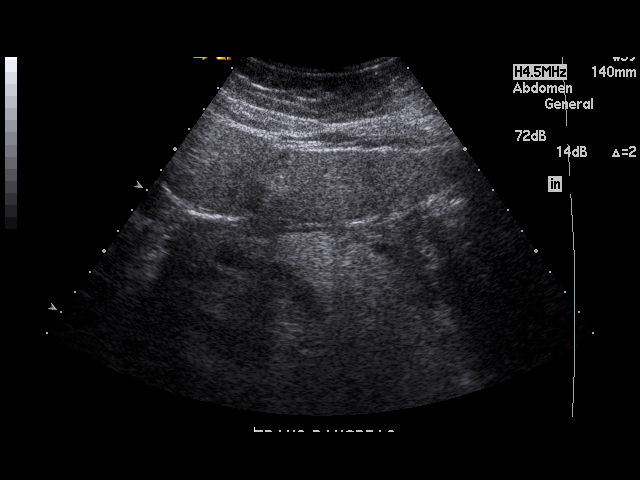
[im 12/133]
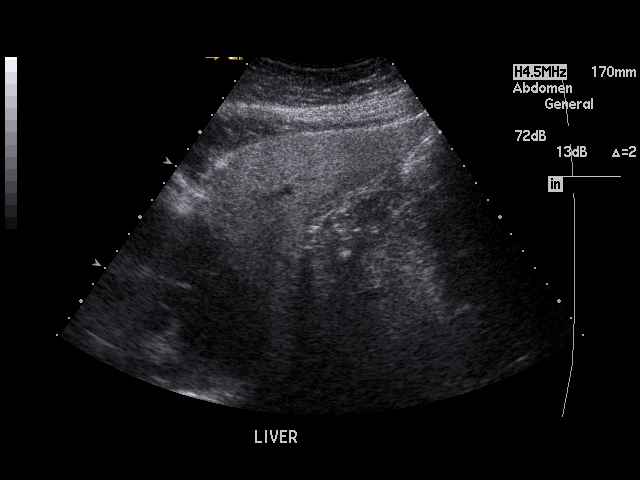
[im 17/133]
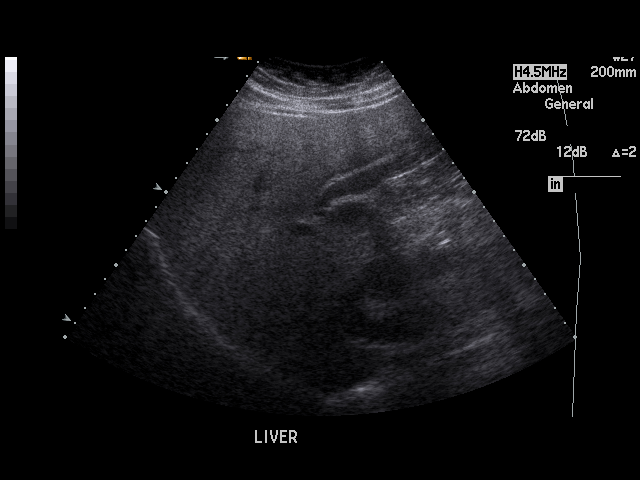
[im 28/133]
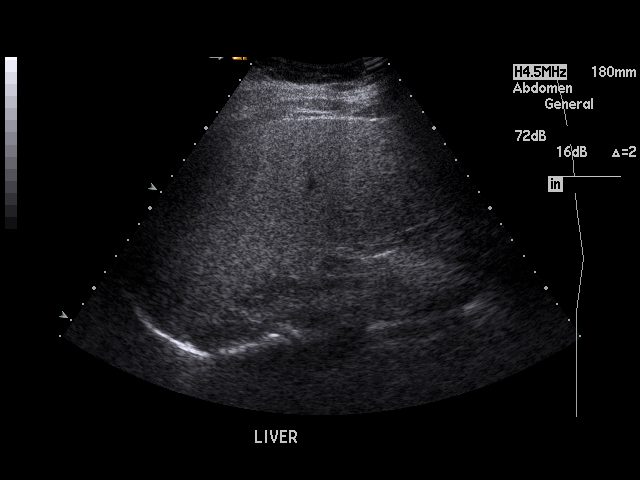
[im 34/133]
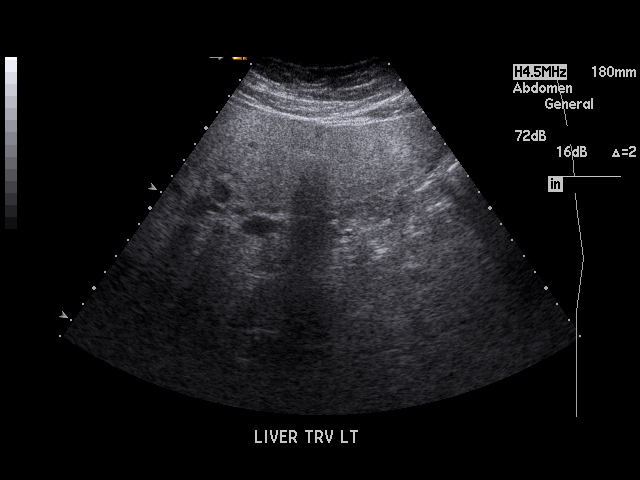
[im 45/133]
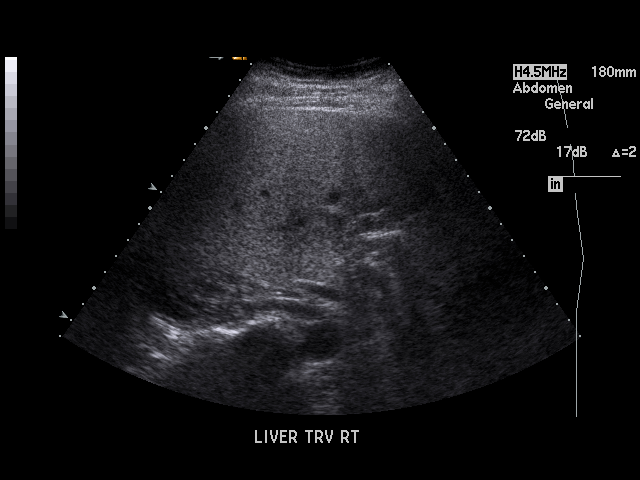
[im 50/133]
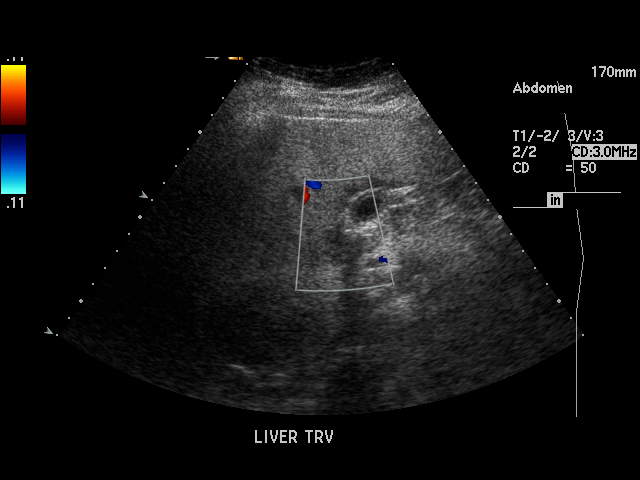
[im 61/133]
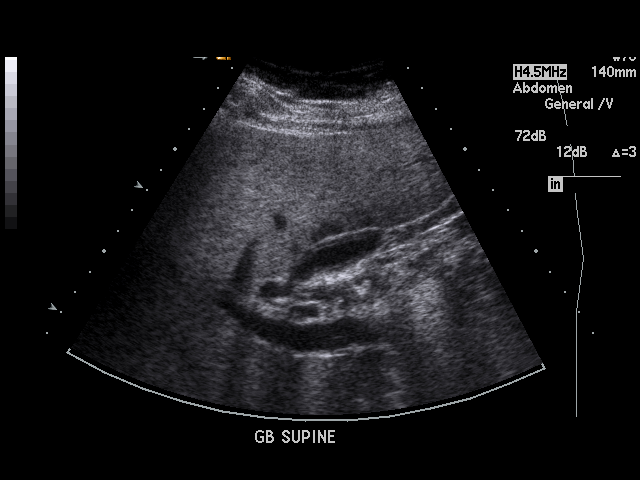
[im 67/133]
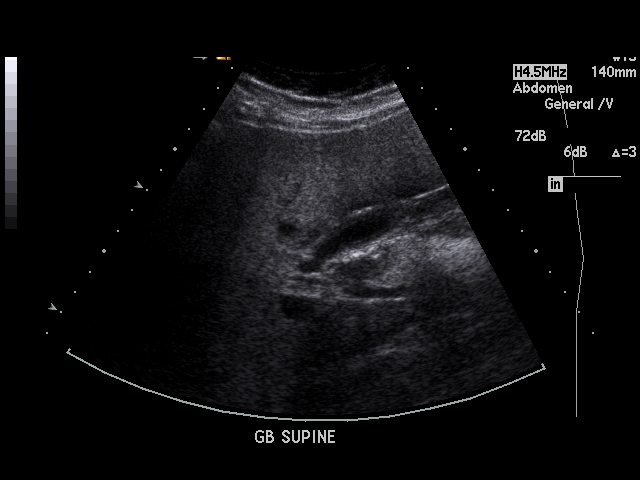
[im 72/133]
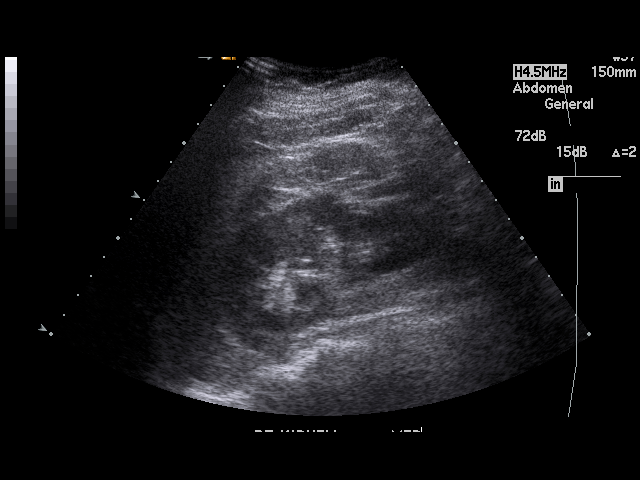
[im 83/133]
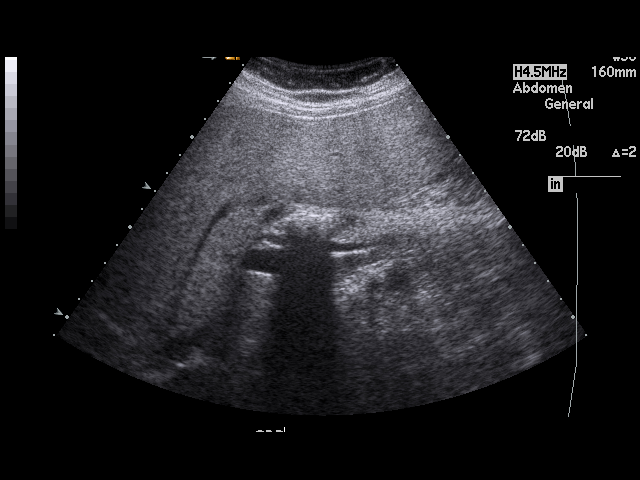
[im 89/133]
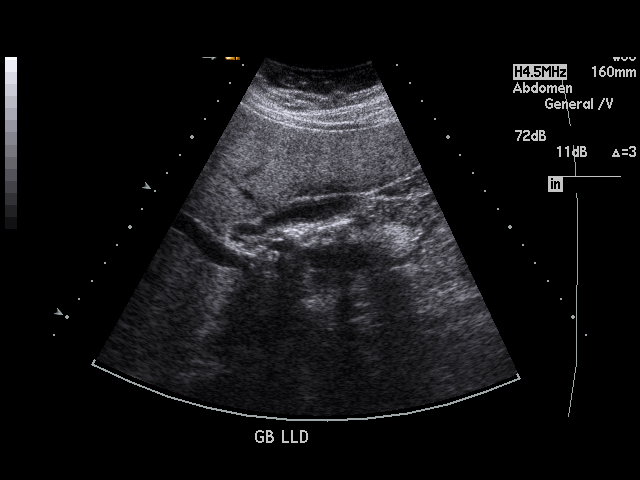
[im 100/133]
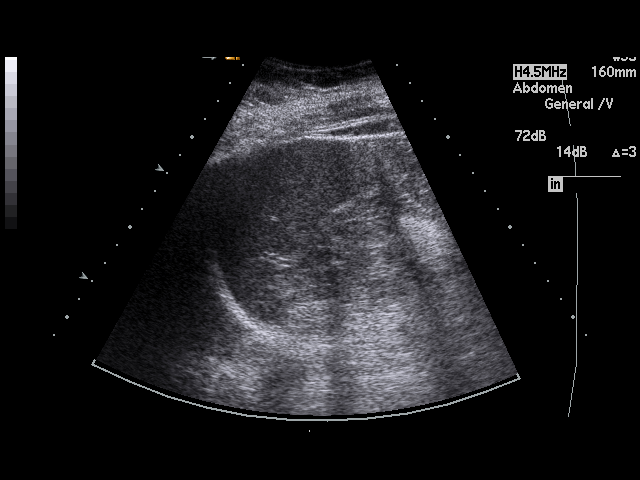
[im 105/133]
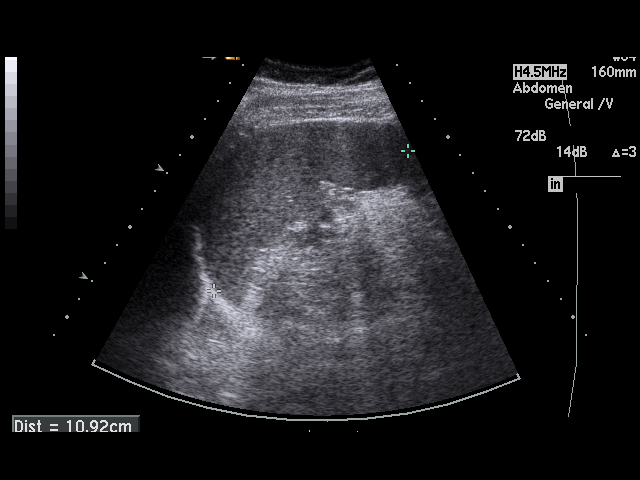
[im 116/133]
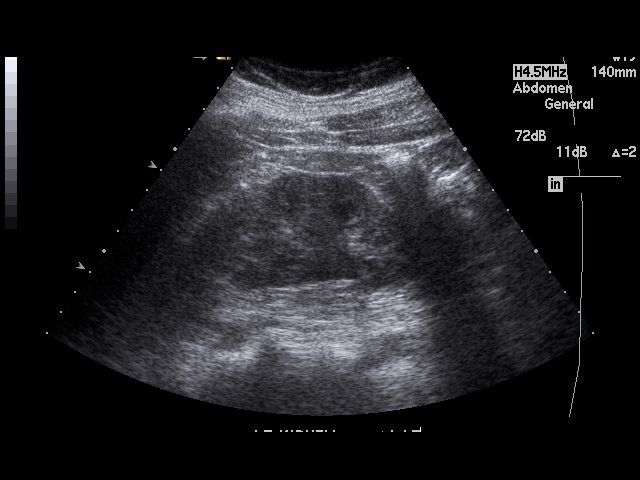
[im 122/133]
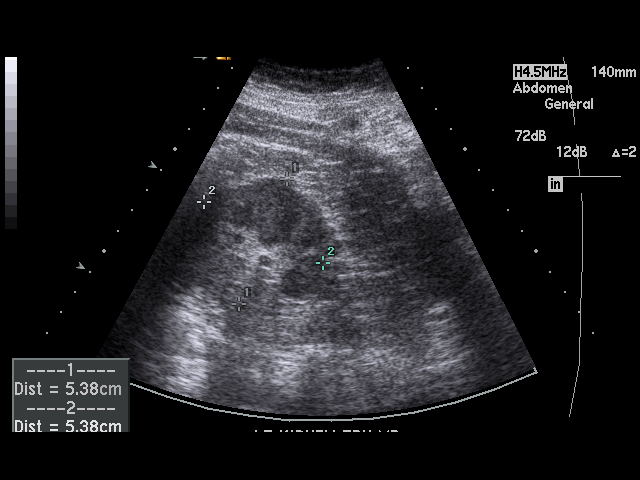
[im 133/133]
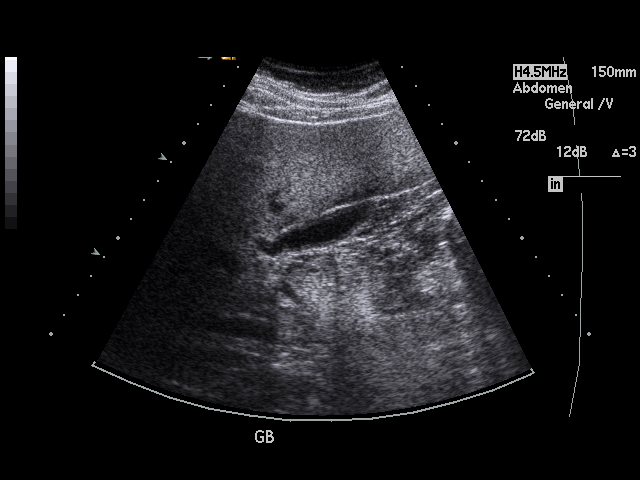

[17 of 25 positions shown; findings below may reference images not displayed]

PROCEDURE:     US  - US ABDOMEN GENERAL SURVEY  - January 27, 2009  [DATE]

RESULT:     Emergent ultrasound of the abdomen demonstrates increased
echotexture in the liver consistent with fatty infiltration. The visualized
portions of the pancreas appear to be normal. There is no sonographic
Murphy's sign. The gallbladder is nondistended. The wall is slightly
thickened but this may be artifactual given the poor distention of the
gallbladder. The wall thickness measures 3.2 mm. Follow-up limited imaging
of the right upper quadrant to evaluate the gallbladder wall could be
performed when the patient is n.p.o. The spleen, visualized aorta and
kidneys appear to be unremarkable. The portal venous flow is normal. There
is some more normal echotexture in the caudate region which is likely some
focal fatty sparing.
IMPRESSION: 1. Poor visualization of the gallbladder given that the gallbladder is
nondistended. The wall appears to be somewhat thickened which could be
artifact. There is no pericholecystic fluid or sonographic Murphy's sign. No
stones are evident. Limited sonographic follow-up of the right upper
quadrant to evaluate the gallbladder when the patient is n.p.o. would be
suggested. Alternatively, a hepatobiliary scan could be considered.
2. The common bile duct diameter is slightly prominent at 5.5 millimeters.
Correlate with LFTs.

## 2009-11-24 IMAGING — CR PELVIS - 1-2 VIEW
1 series · 1 of 1 positions shown · non-contrast
Comparison: none

REASON FOR EXAM: injury fall pain
COMMENTS:   LMP: Mahiru., has not had sex since last pd

PROCEDURE:     DXR - DXR PELVIS AP ONLY  - March 15, 2009  [DATE]
RESULT:     Comparison is made to a prior exam of 07/08/2008. No fracture,
dislocation or other acute bony abnormality is identified. The hip joint
spaces are well-maintained.

[view not recorded]
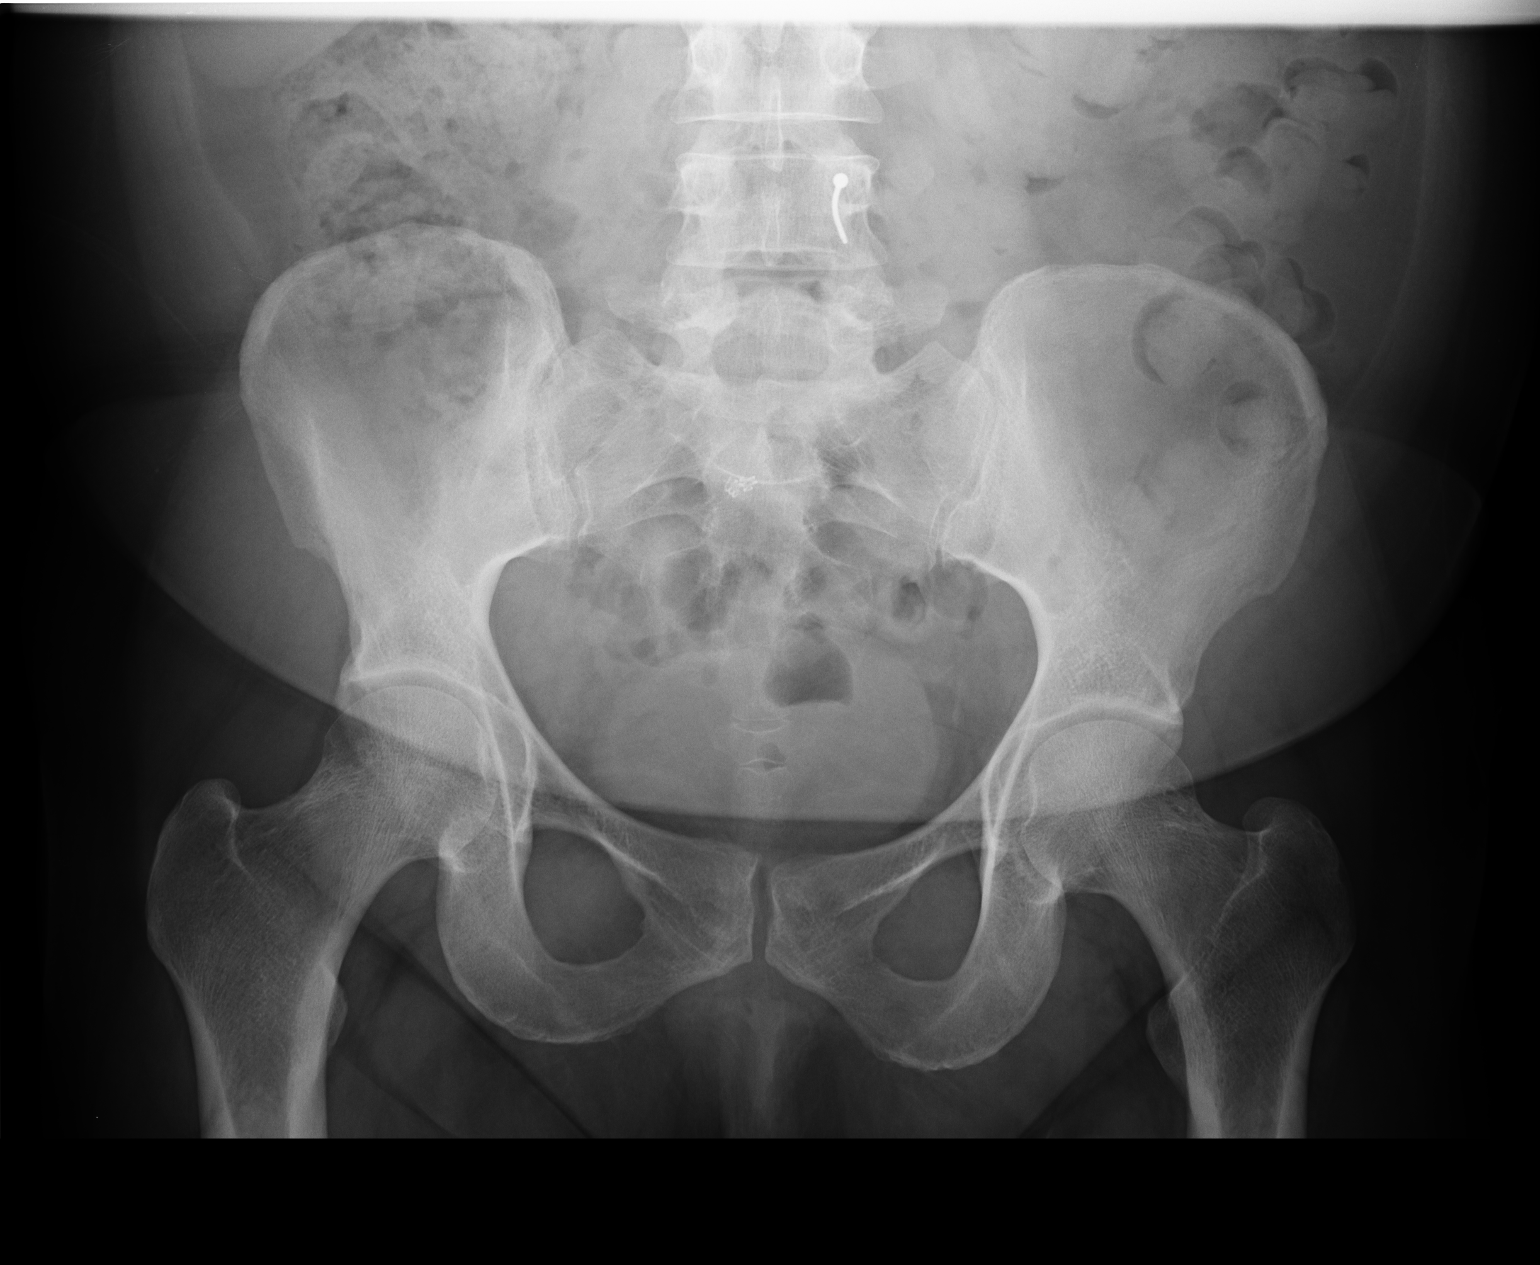

[1 of 1 positions shown; findings below may reference images not displayed]

IMPRESSION: 1.     No significant osseous abnormalities are noted.

## 2009-11-24 IMAGING — CR DG LUMBAR SPINE 2-3V
1 series · 3 of 3 positions shown · non-contrast
Comparison: none

REASON FOR EXAM: fall injury pain
COMMENTS:   LMP: Surbakti, Asyrofi has not had sex since last pd

PROCEDURE:     DXR - DXR LUMBAR SPINE AP AND LATERAL  - March 15, 2009  [DATE]
RESULT:     Comparison is made to a prior exam of 08/04/2008. The vertebral
body heights and intervertebral disc spaces are well maintained. Vertebral
body alignment is normal. The pedicles are bilaterally intact.

[Series 1: view not recorded · 0.17mm/px · 3 of 3 slices shown]
[im 1/3]
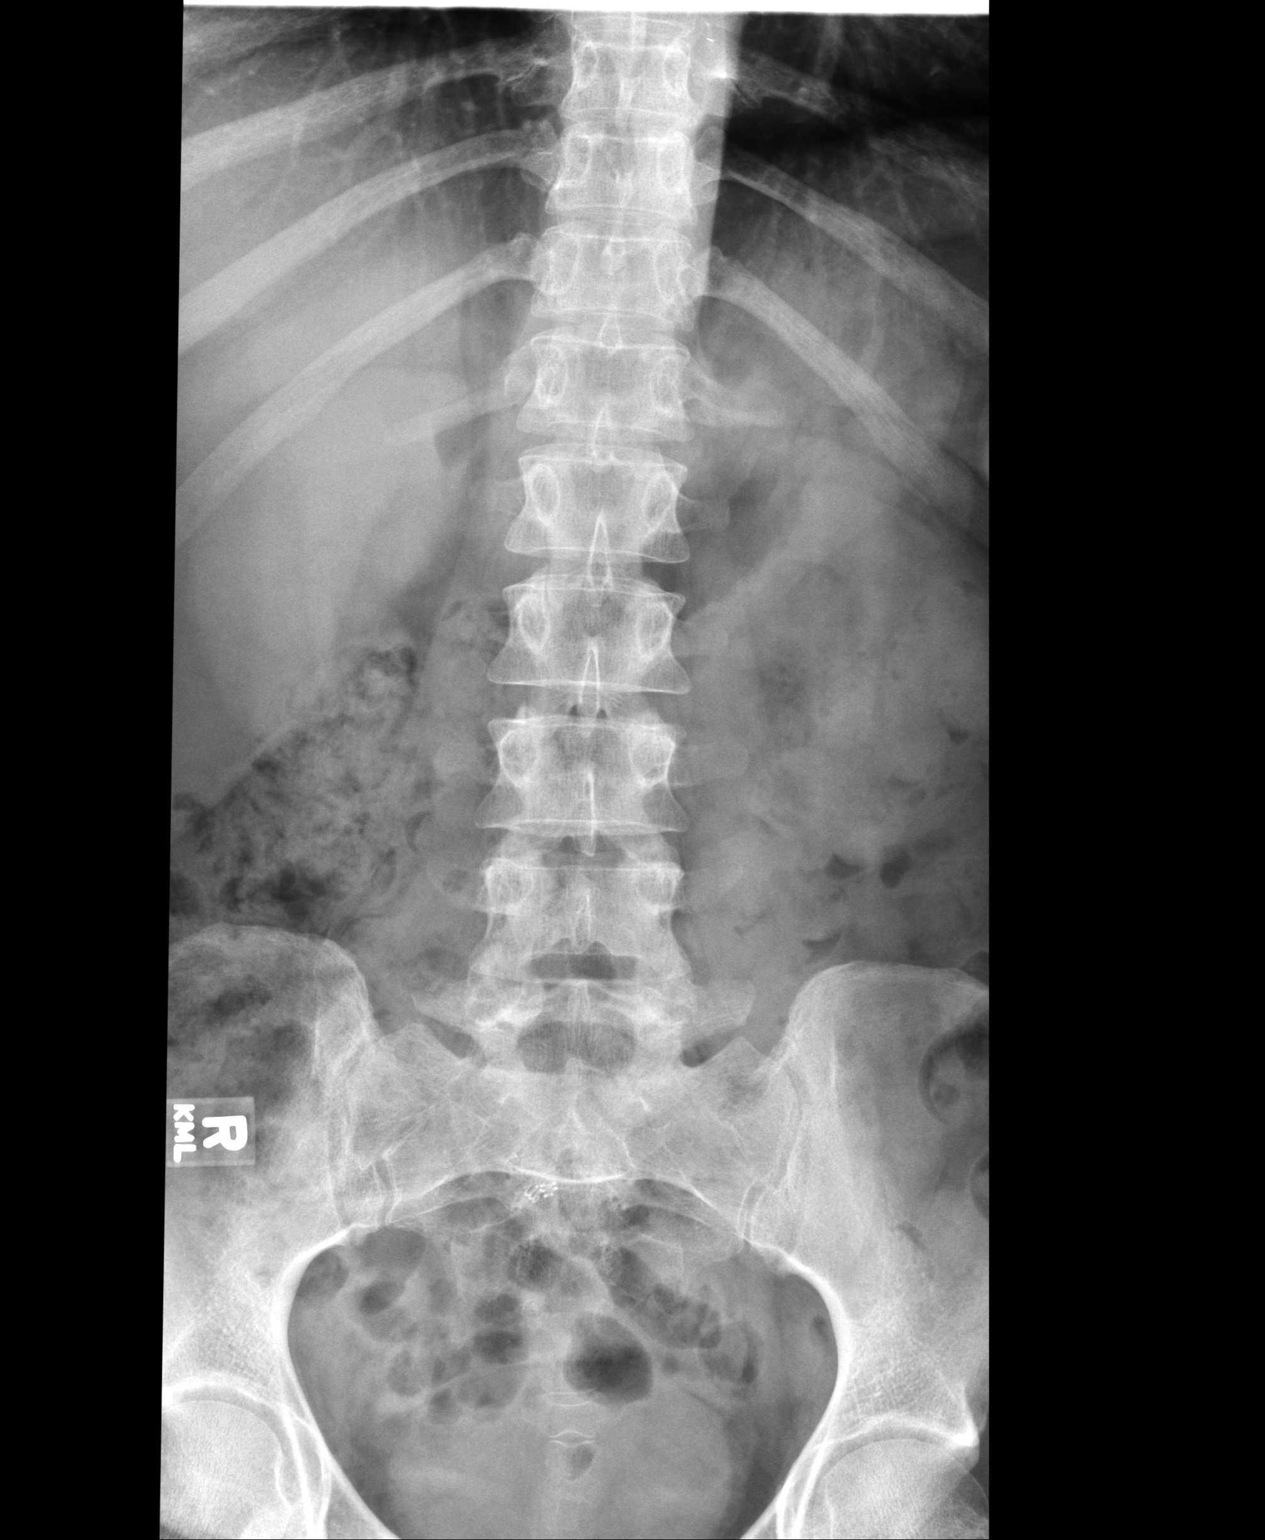
[im 2/3]
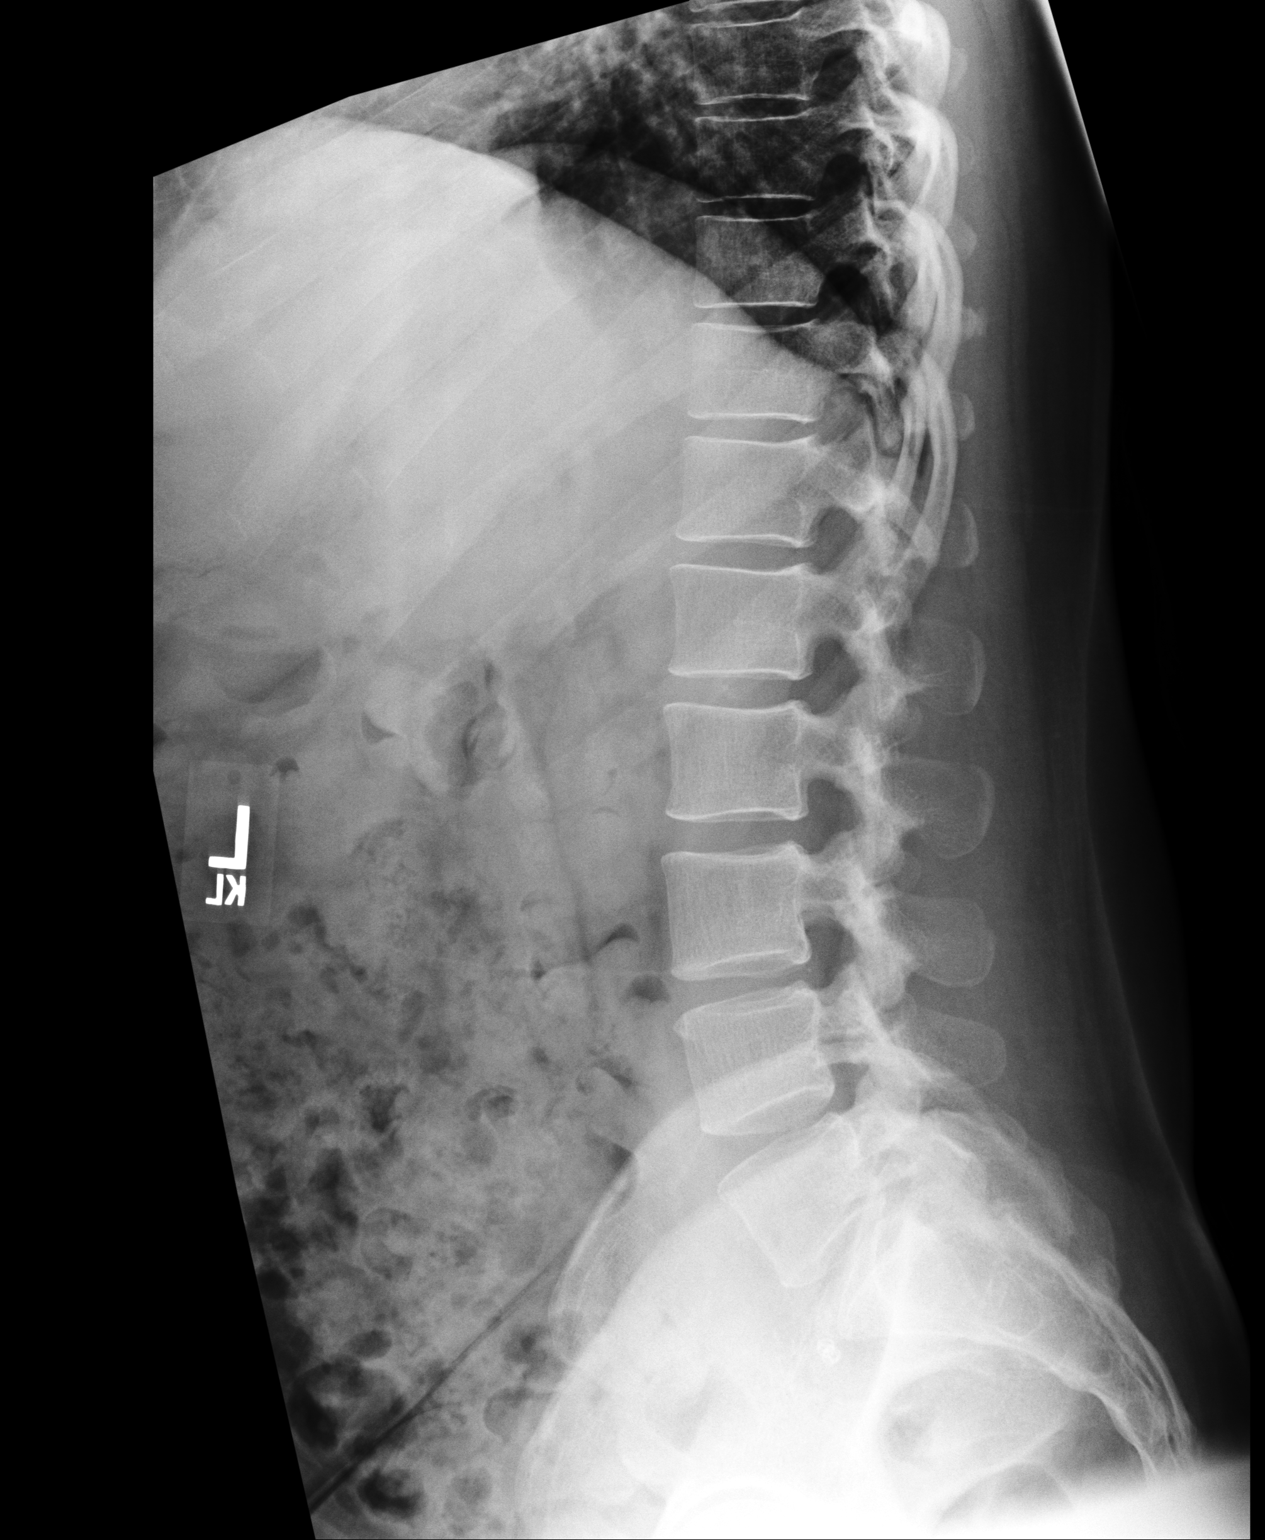
[im 3/3]
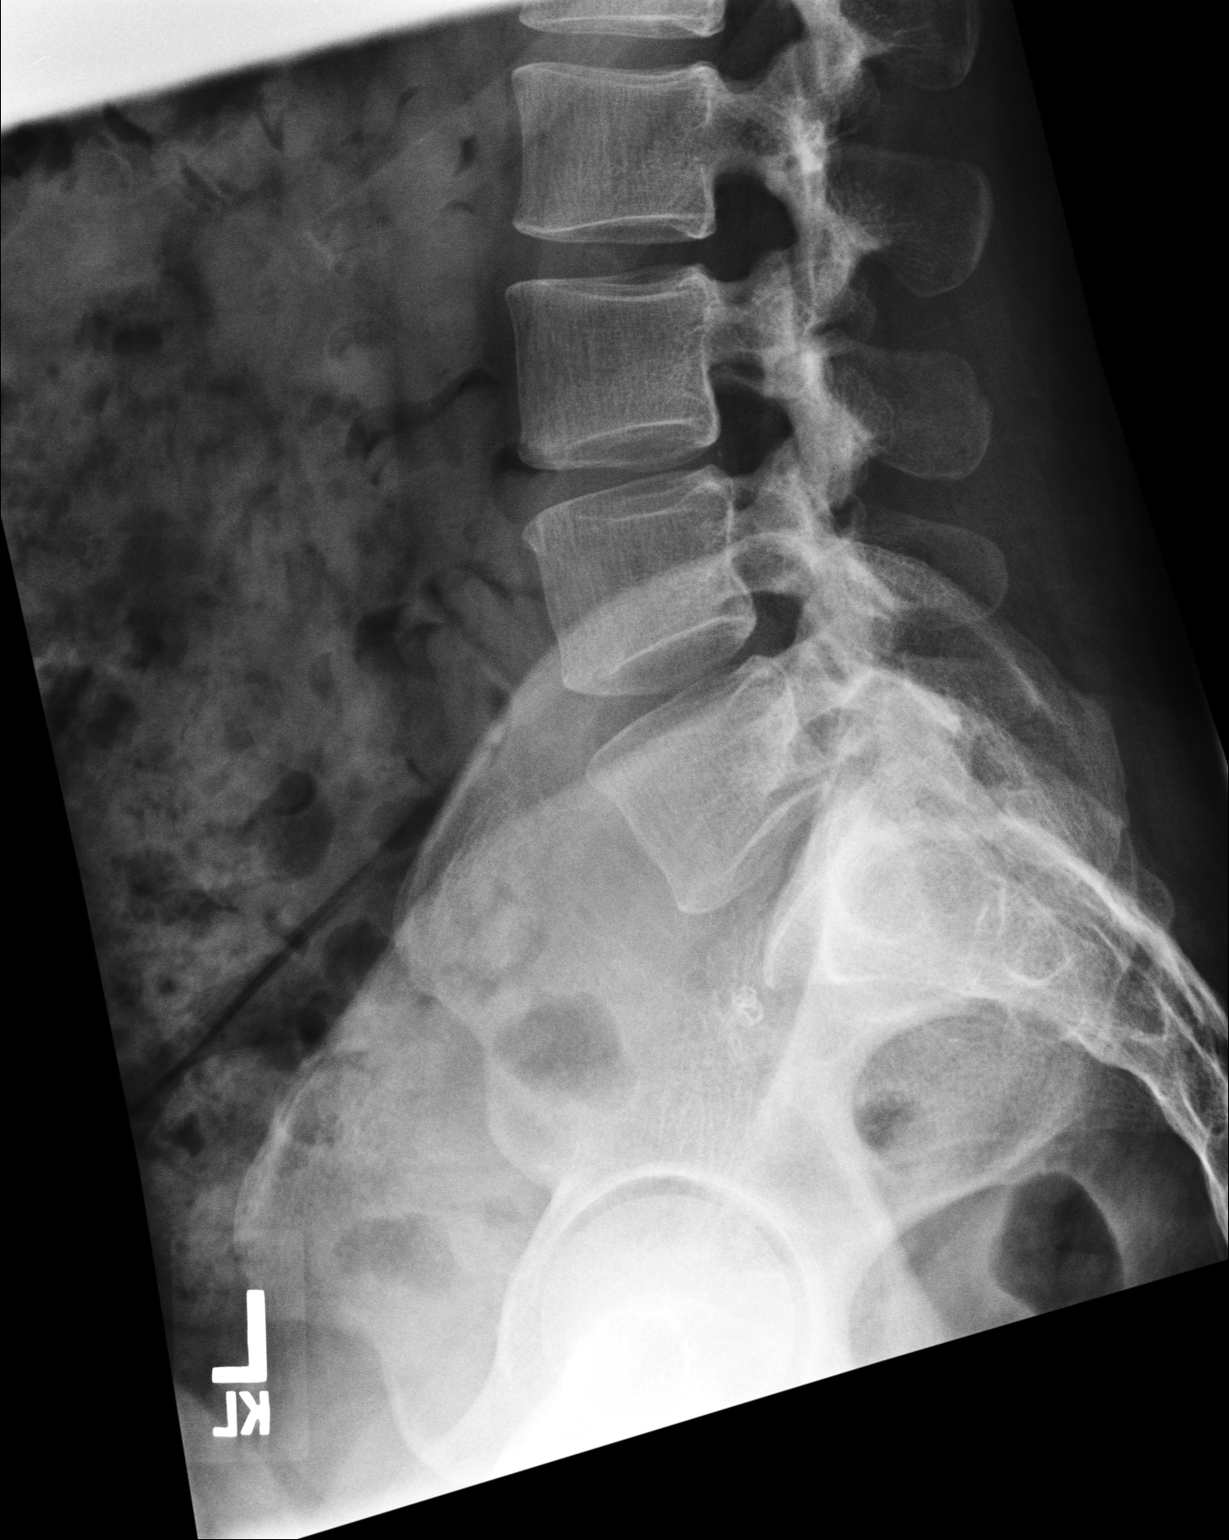

[3 of 3 positions shown; findings below may reference images not displayed]

IMPRESSION: No significant abnormalities are noted.
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
## 2010-01-18 IMAGING — CR DG CHEST 2V
1 series · 2 of 2 positions shown · non-contrast
Comparison: none

REASON FOR EXAM: cough
COMMENTS:   May transport without cardiac monitor

[Series 1: view not recorded · 0.17mm/px · 2 of 2 slices shown]
[im 1/2]
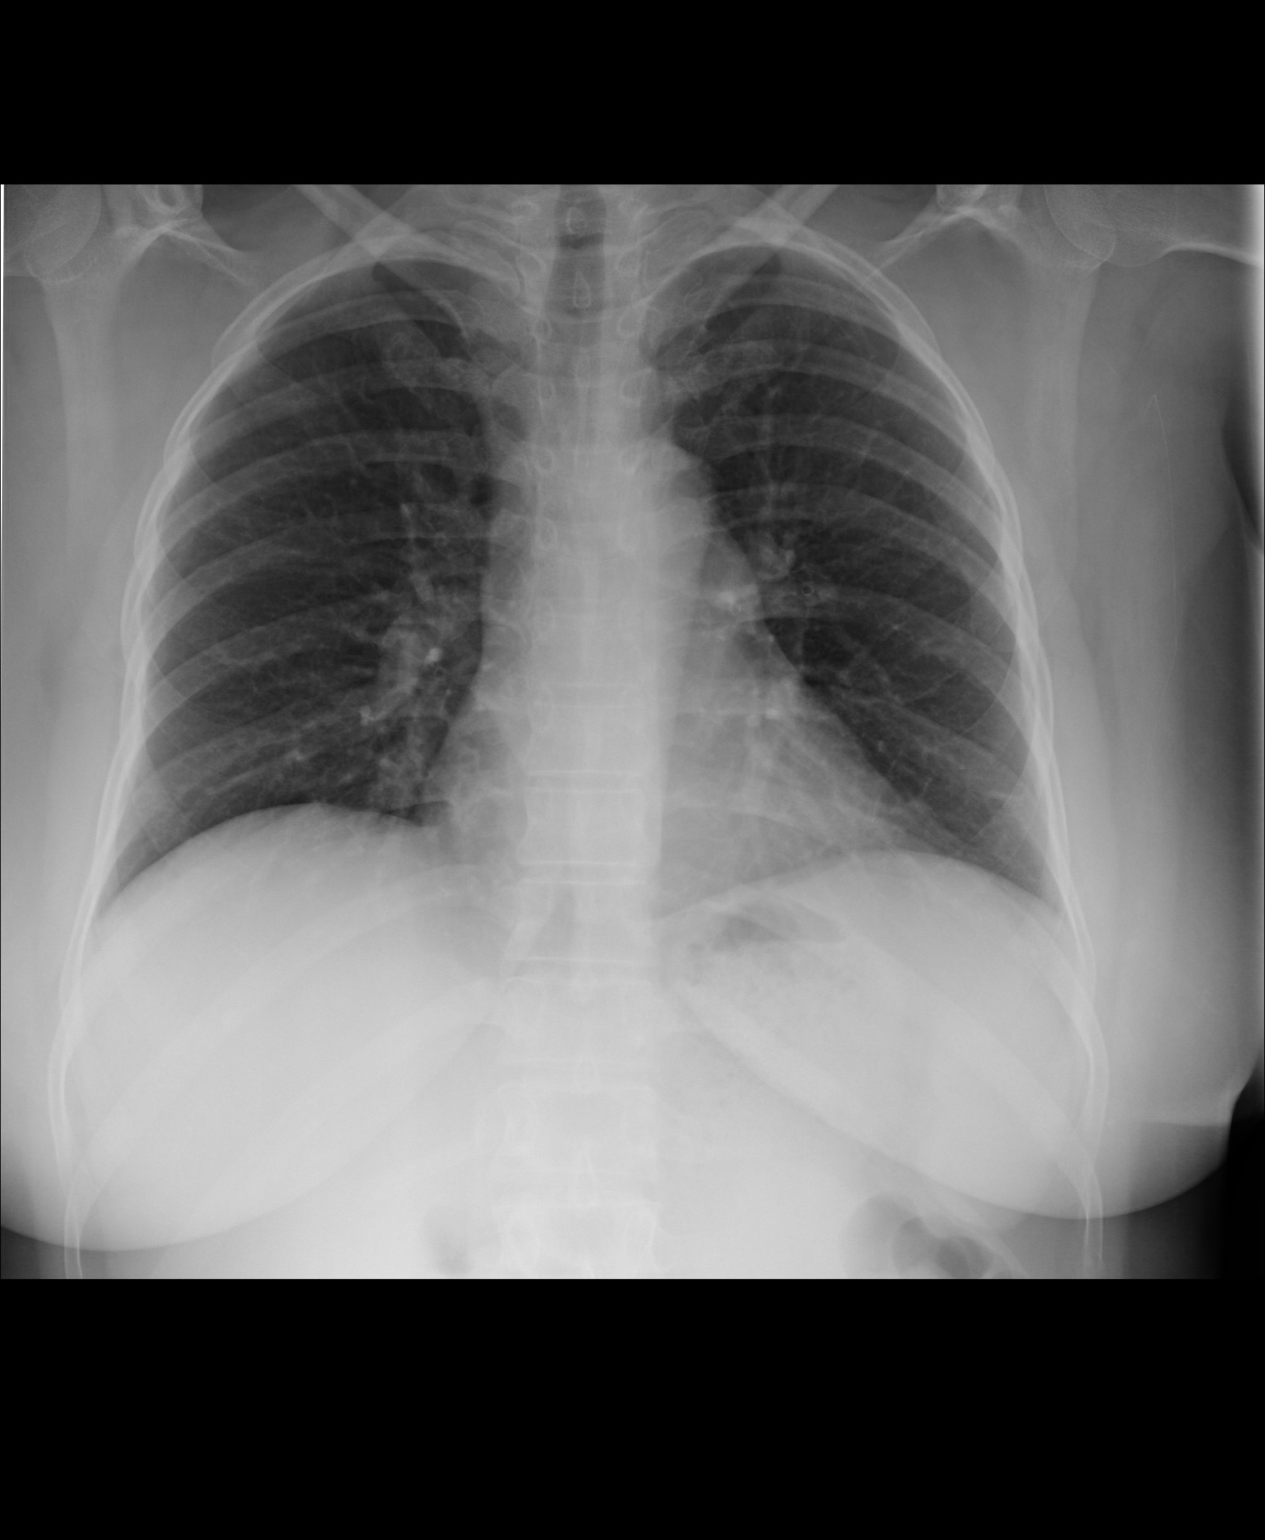
[im 2/2]
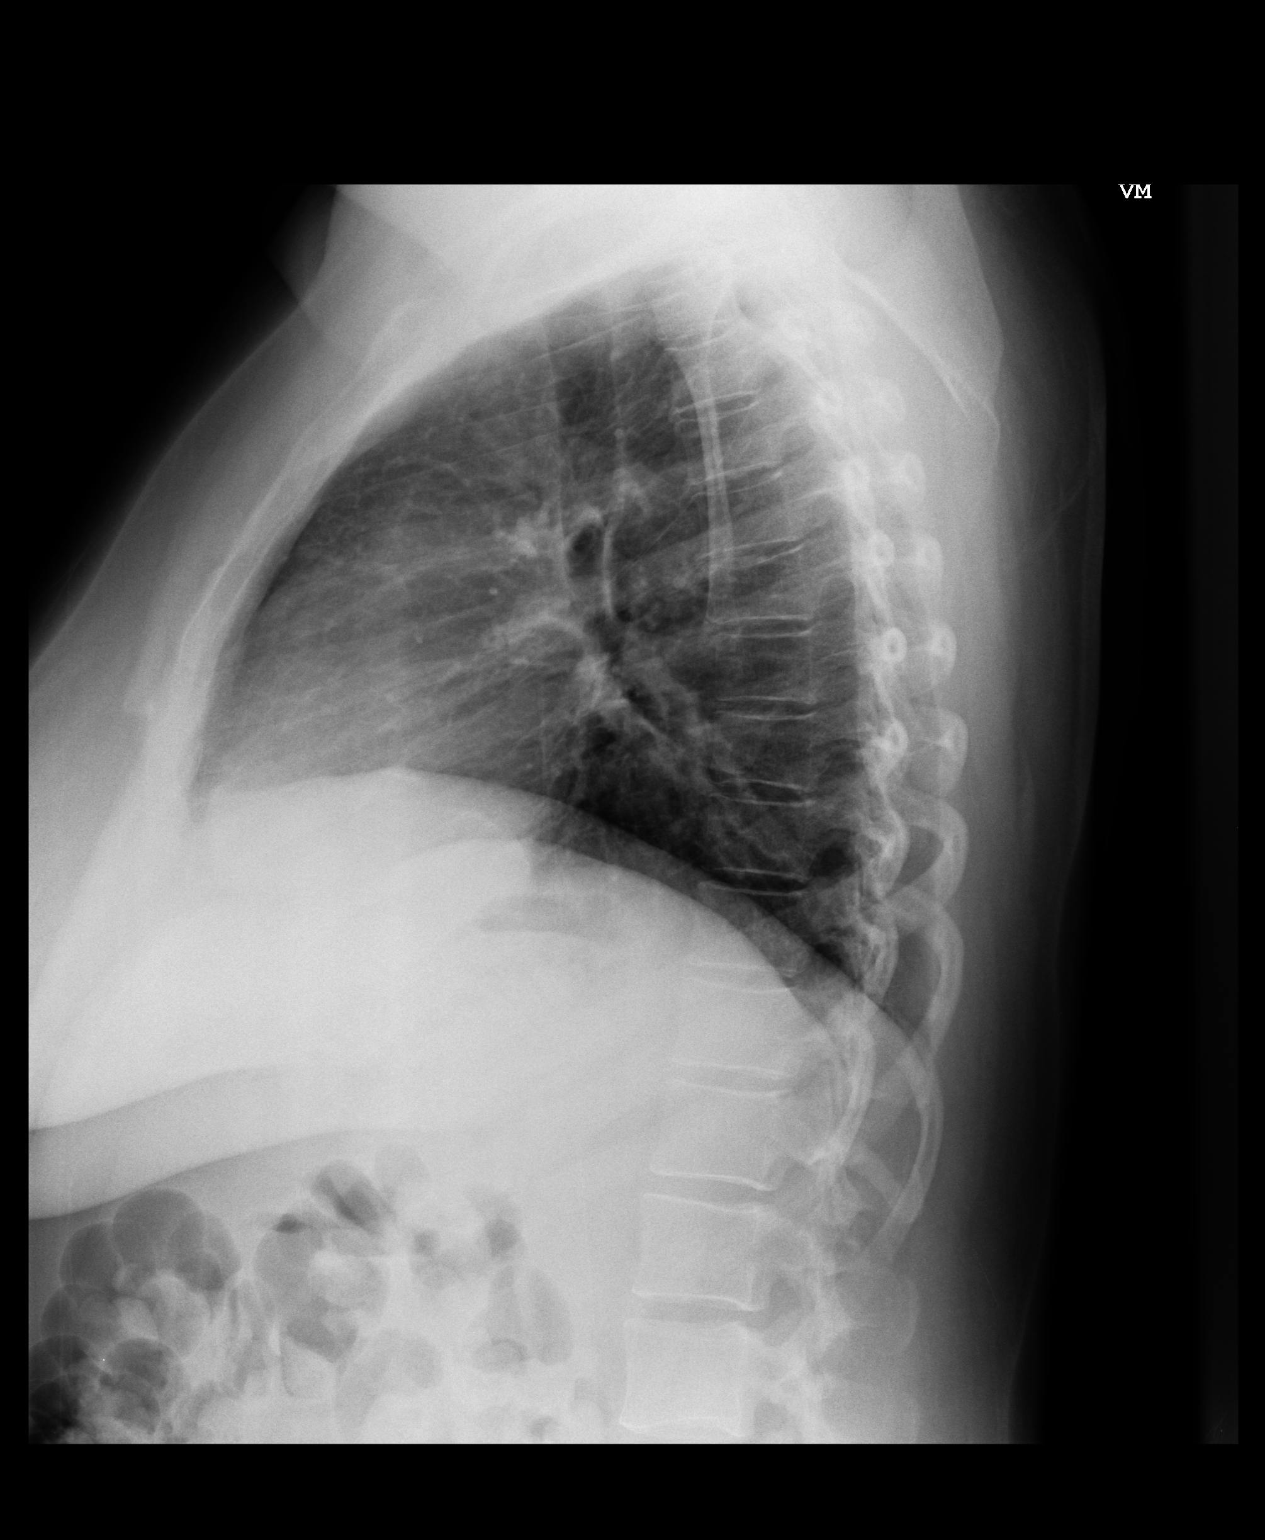

[2 of 2 positions shown; findings below may reference images not displayed]

PROCEDURE:     DXR - DXR CHEST PA (OR AP) AND LATERAL  - May 09, 2009  [DATE]

RESULT:     Comparison is made to a study 03 May, 2008.

The lungs are mildly hypoinflated. There is no focal infiltrate. The cardiac
silhouette is normal in size. The pulmonary vascularity is not engorged.
There is no pleural effusion.
IMPRESSION: There is mild hypoinflation. I do not see evidence of CHF
nor pneumonia nor definite evidence of atelectasis. Followup imaging is
available if the patient's symptoms warrant this.

## 2010-01-23 ENCOUNTER — Ambulatory Visit: Payer: Self-pay | Admitting: Obstetrics & Gynecology

## 2010-01-23 IMAGING — CR DG LUMBAR SPINE 2-3V
1 series · 3 of 3 positions shown · non-contrast
Comparison: none

REASON FOR EXAM: back pain
COMMENTS:

PROCEDURE:     DXR - DXR LUMBAR SPINE AP AND LATERAL  - March 21, 2008 [DATE]
RESULT:     Six non-rib bearing lumbar vertebral bodies are appreciated.
There does not appear to be evidence of acute fracture or dislocation.

[Series 1: view not recorded · 0.17mm/px · 3 of 3 slices shown]
[im 1/3]
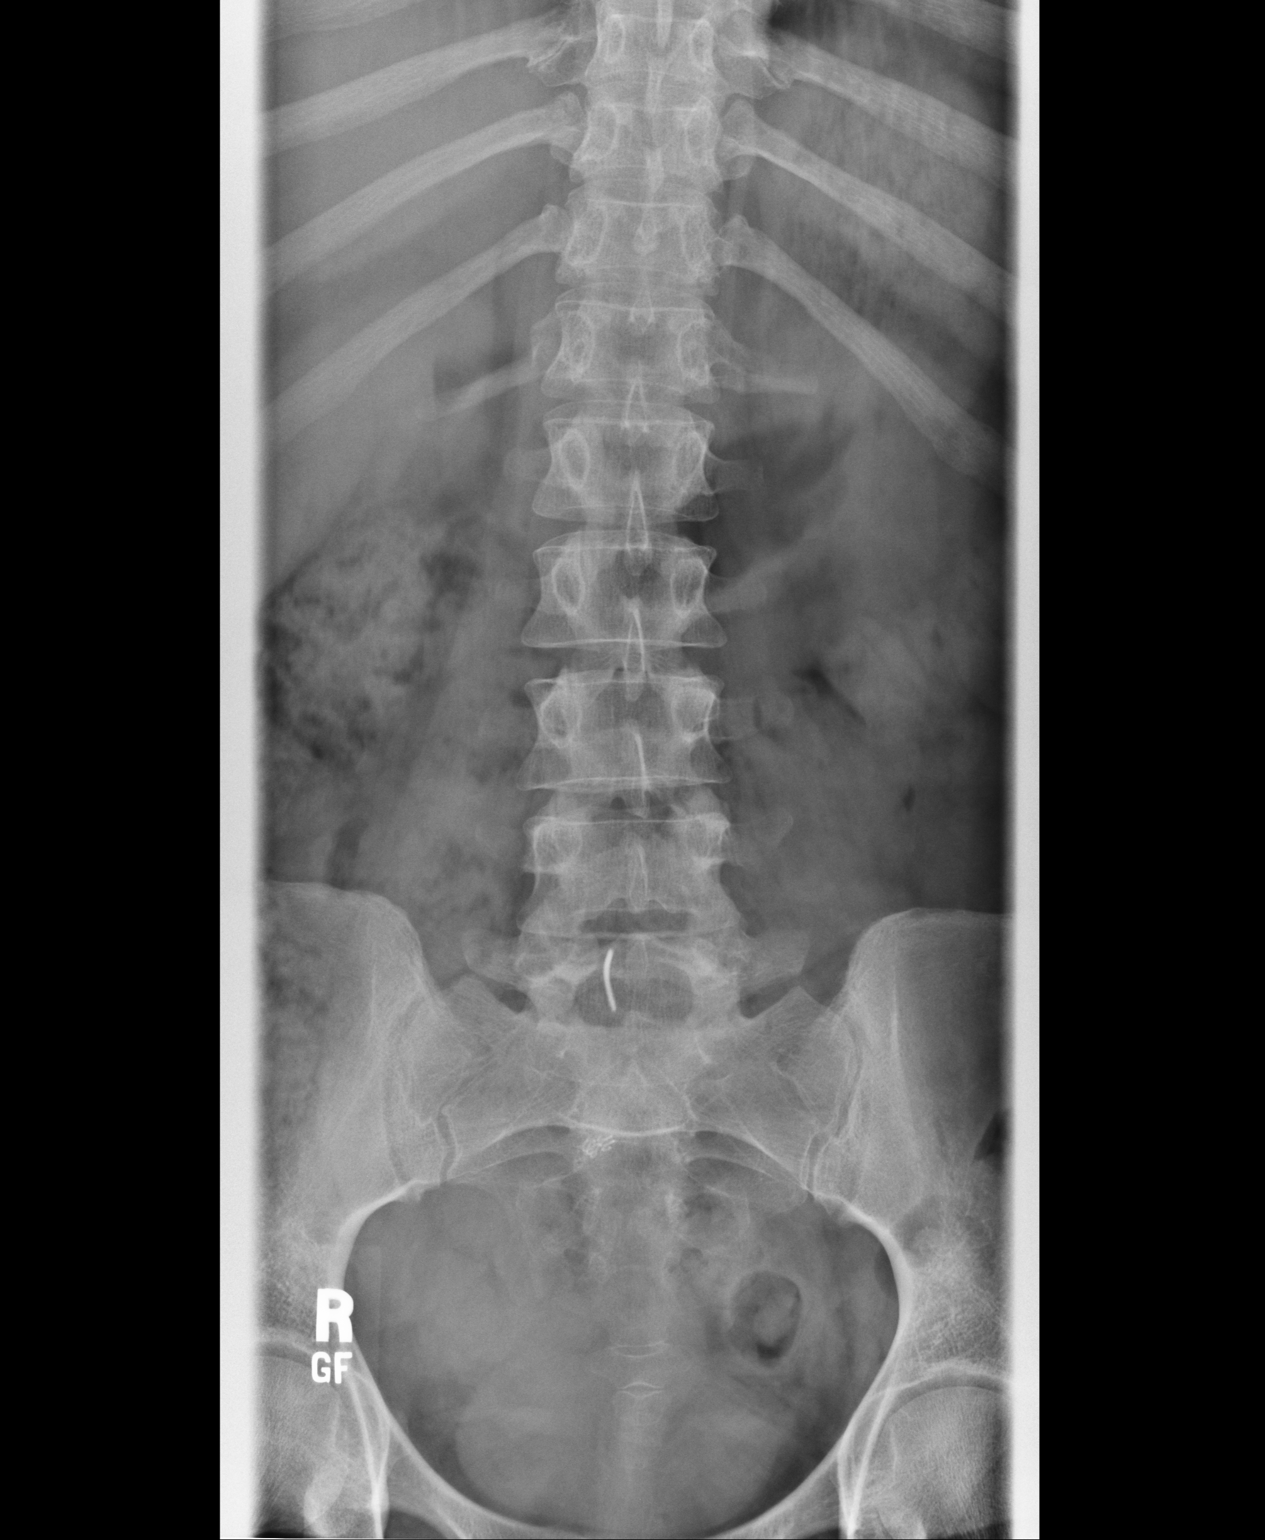
[im 2/3]
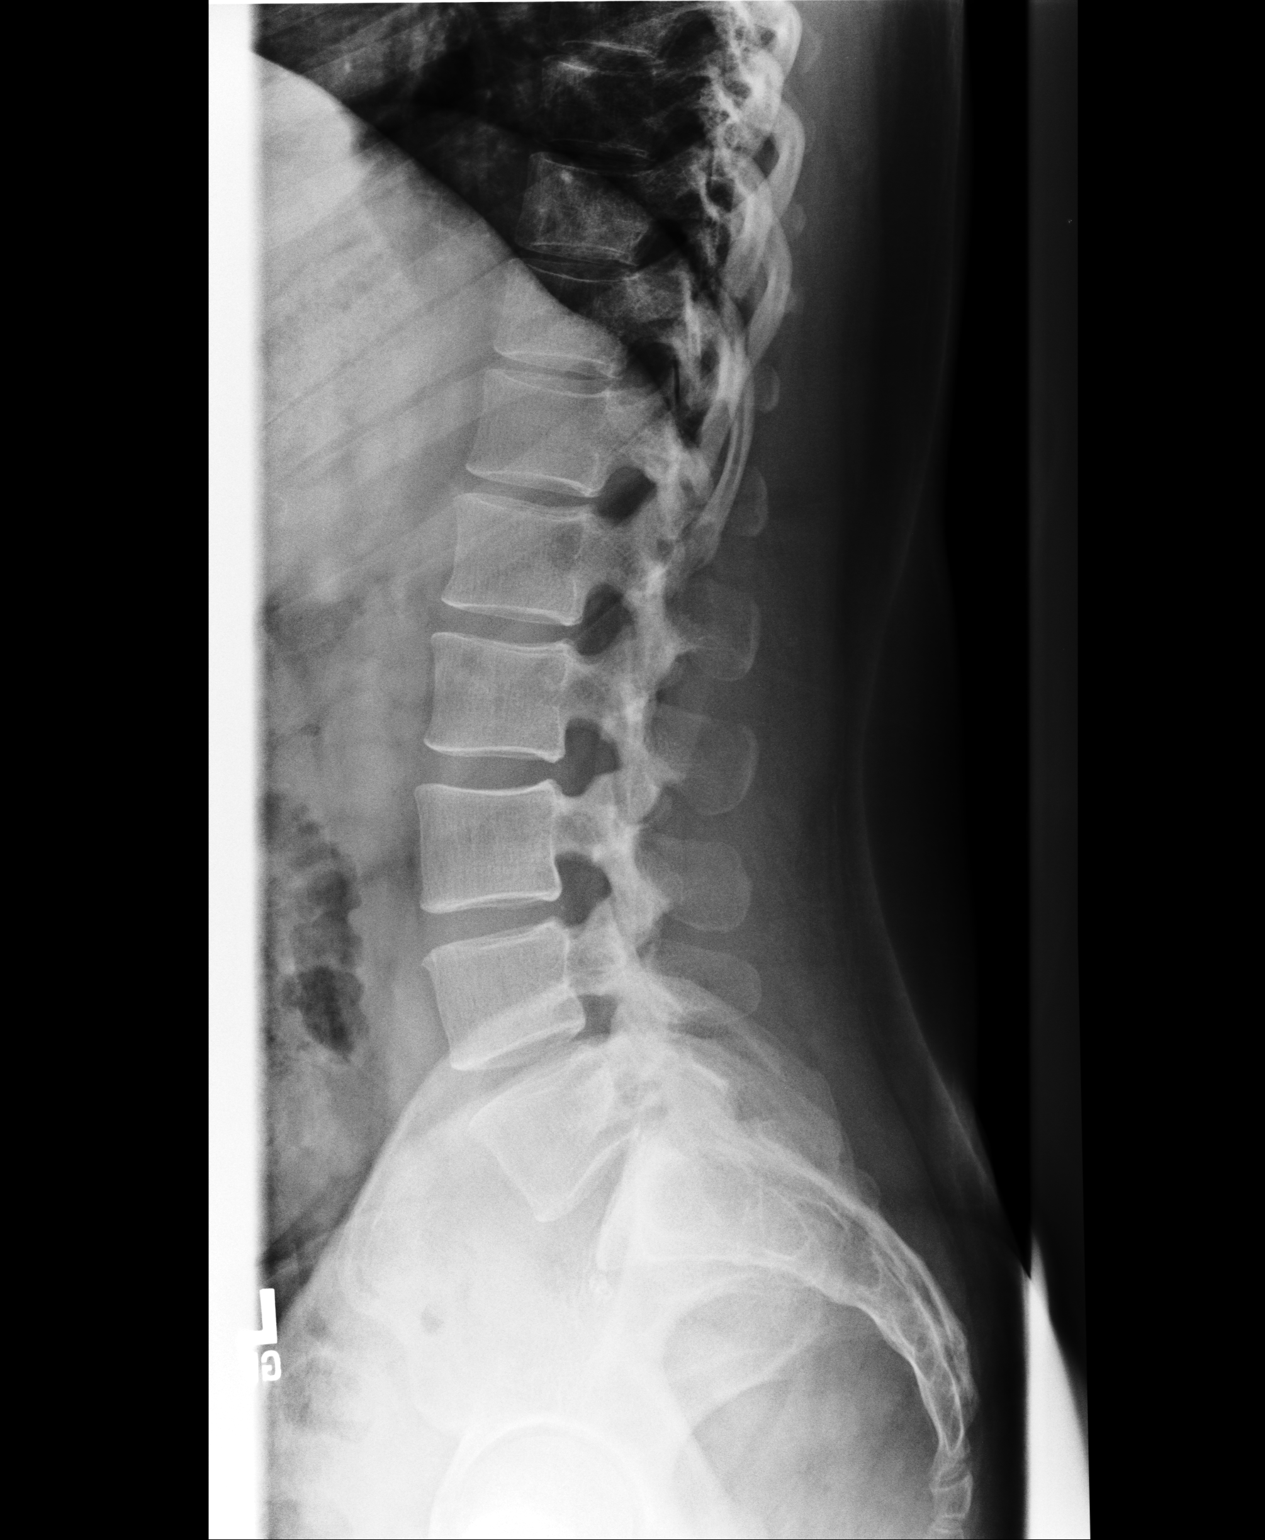
[im 3/3]
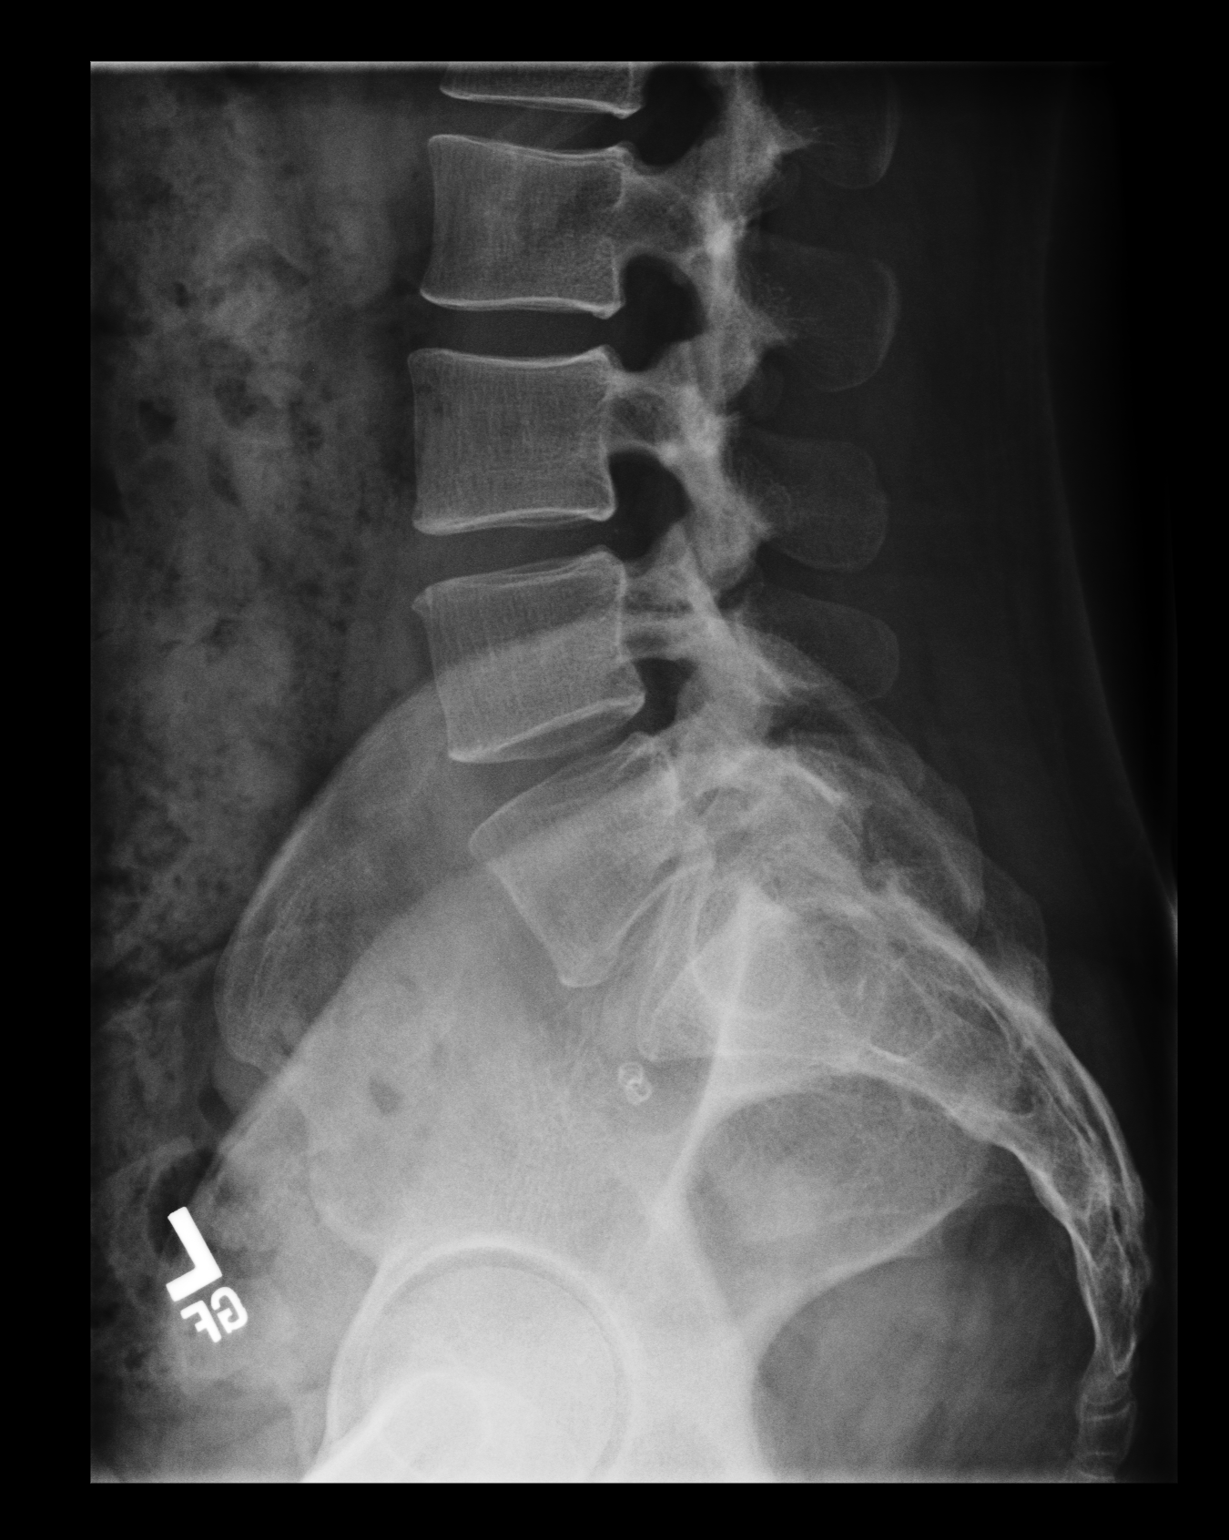

[3 of 3 positions shown; findings below may reference images not displayed]

IMPRESSION: No evidence of acute osseous abnormalities.  There appears to be a
transitional vertebral body at the L1 level.

## 2010-02-04 IMAGING — CR DG LUMBAR SPINE 2-3V
1 series · 3 of 3 positions shown · non-contrast
Comparison: none

REASON FOR EXAM: fall w/ diffuse low back pain
COMMENTS:

PROCEDURE:     DXR - DXR LUMBAR SPINE AP AND LATERAL  - May 26, 2009  [DATE]
RESULT:     No acute soft tissue or bony abnormality is identified.

[Series 1: view not recorded · 0.17mm/px · 3 of 3 slices shown]
[im 1/3]
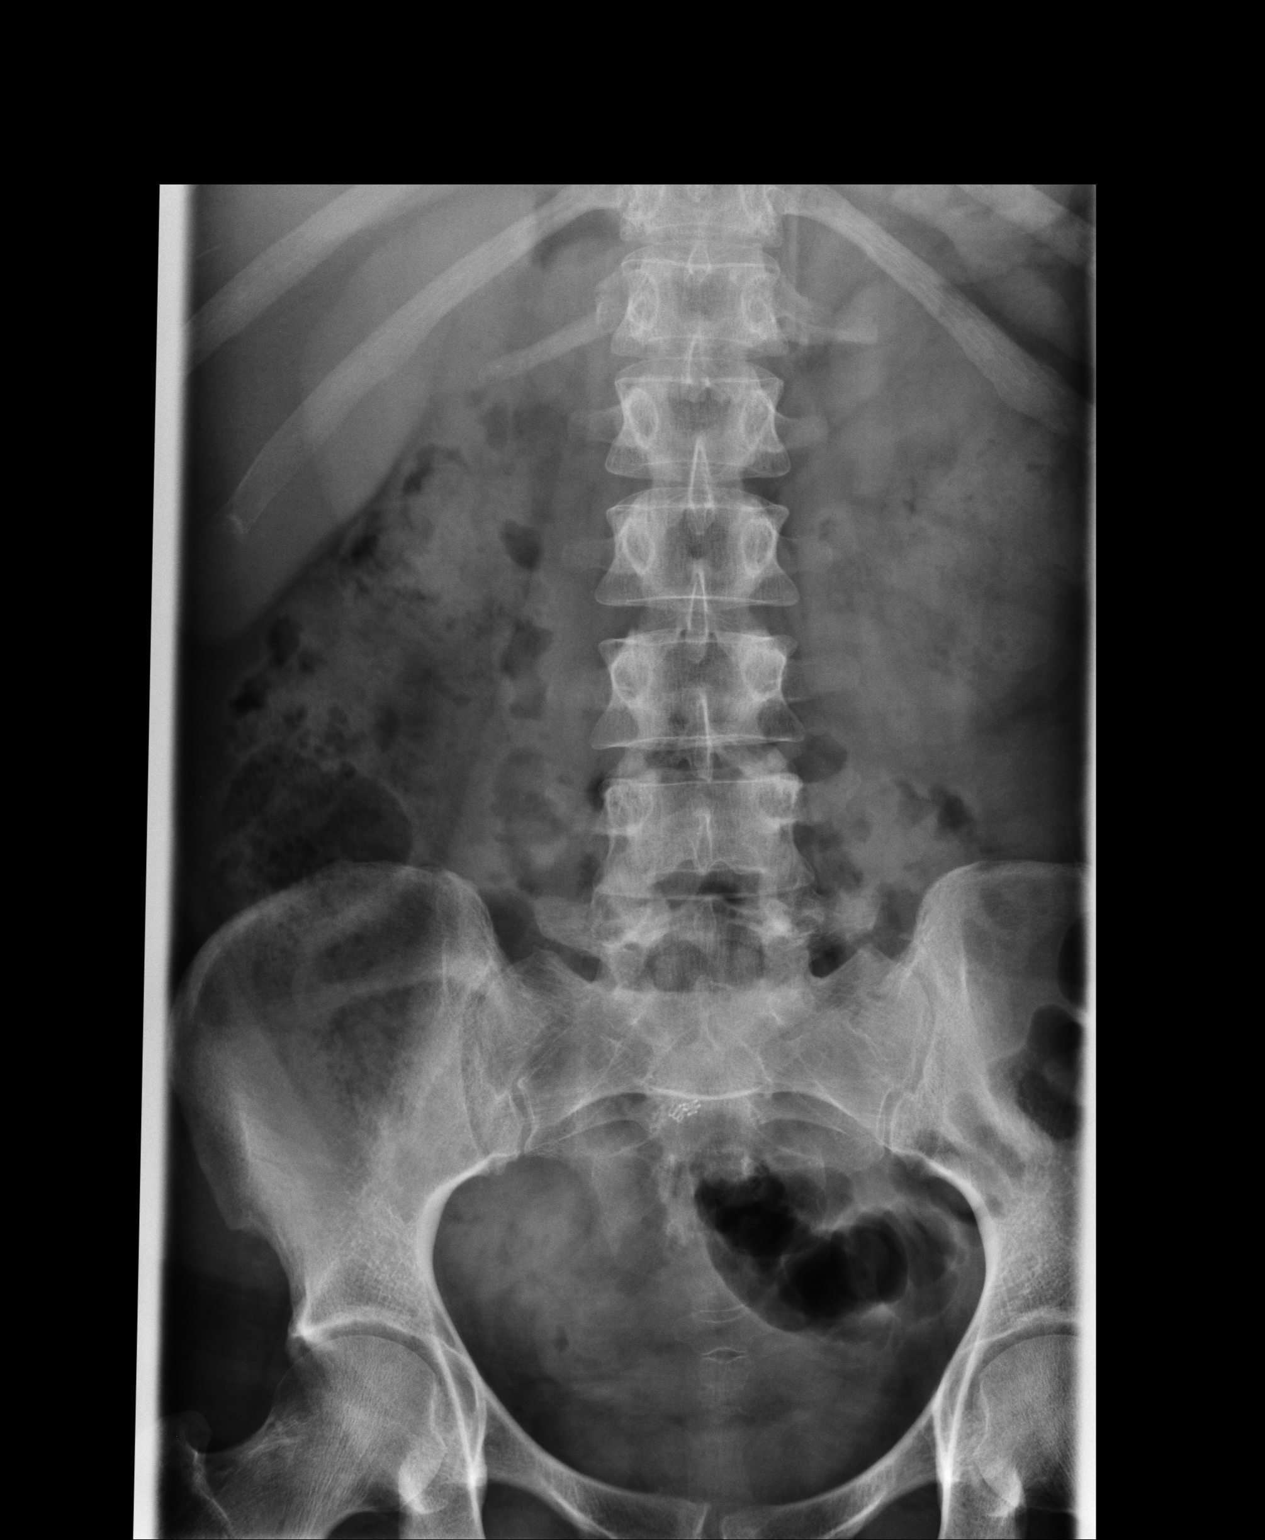
[im 2/3]
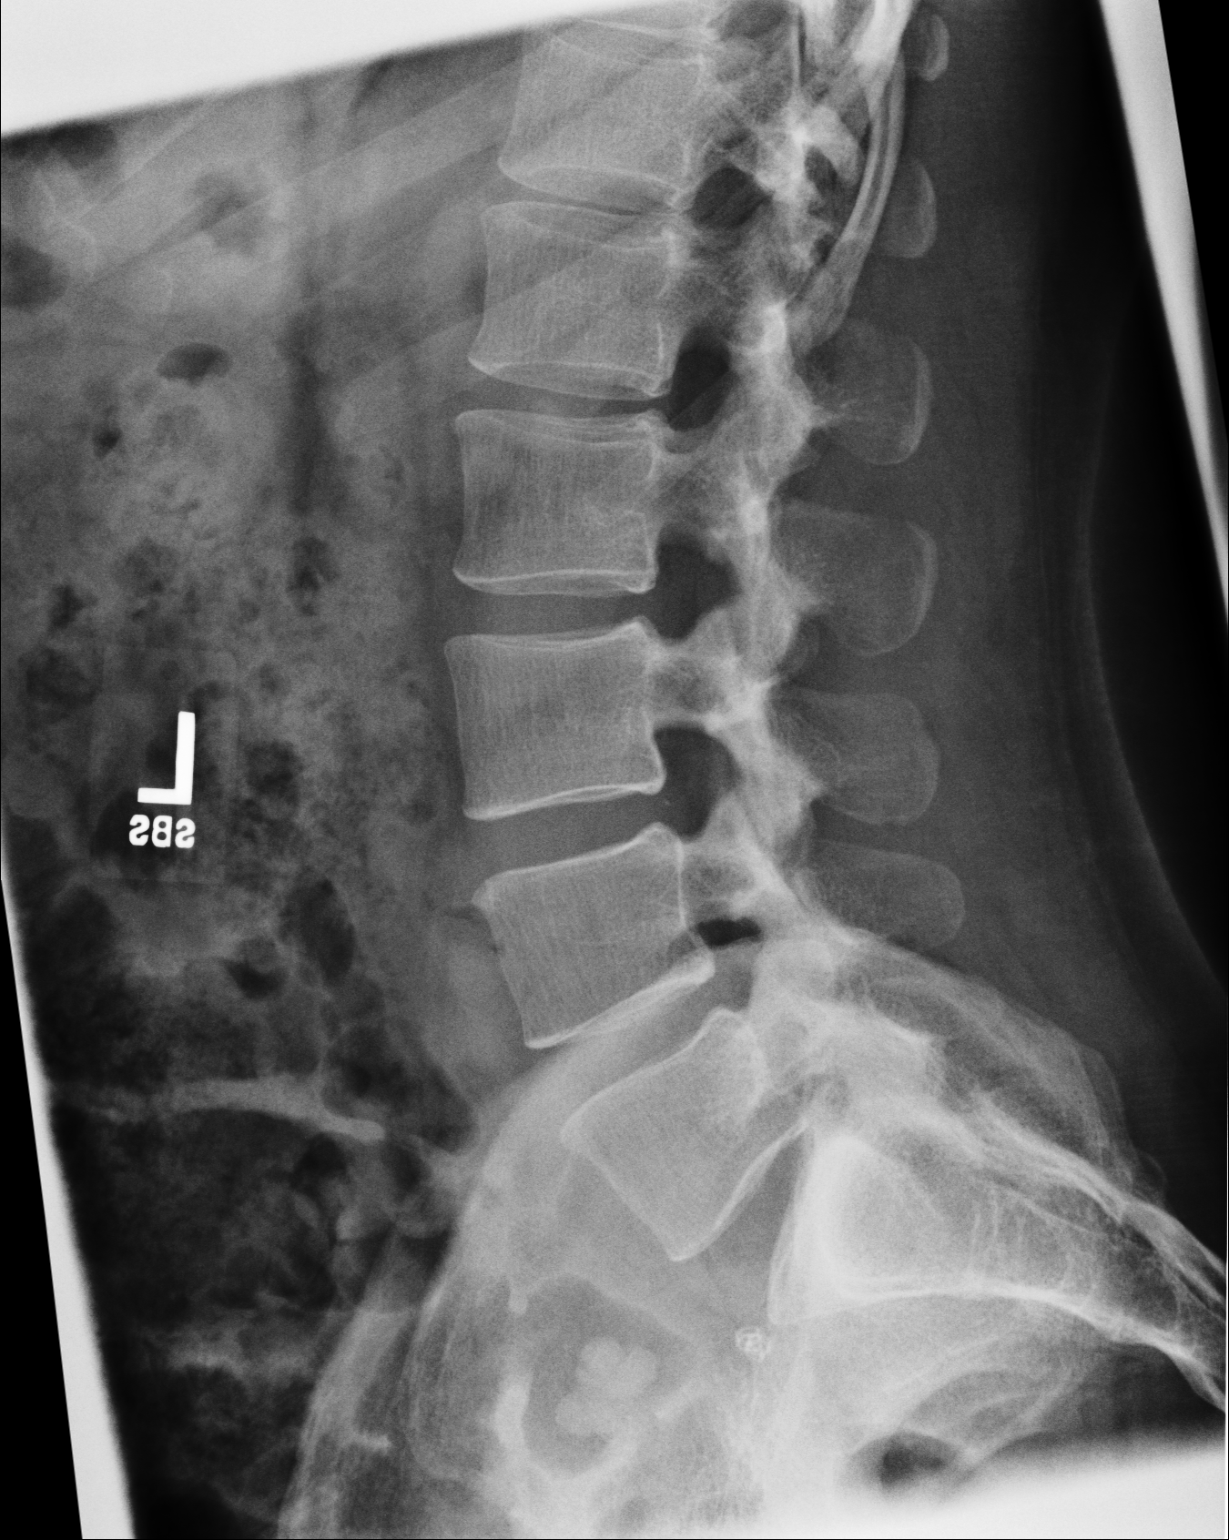
[im 3/3]
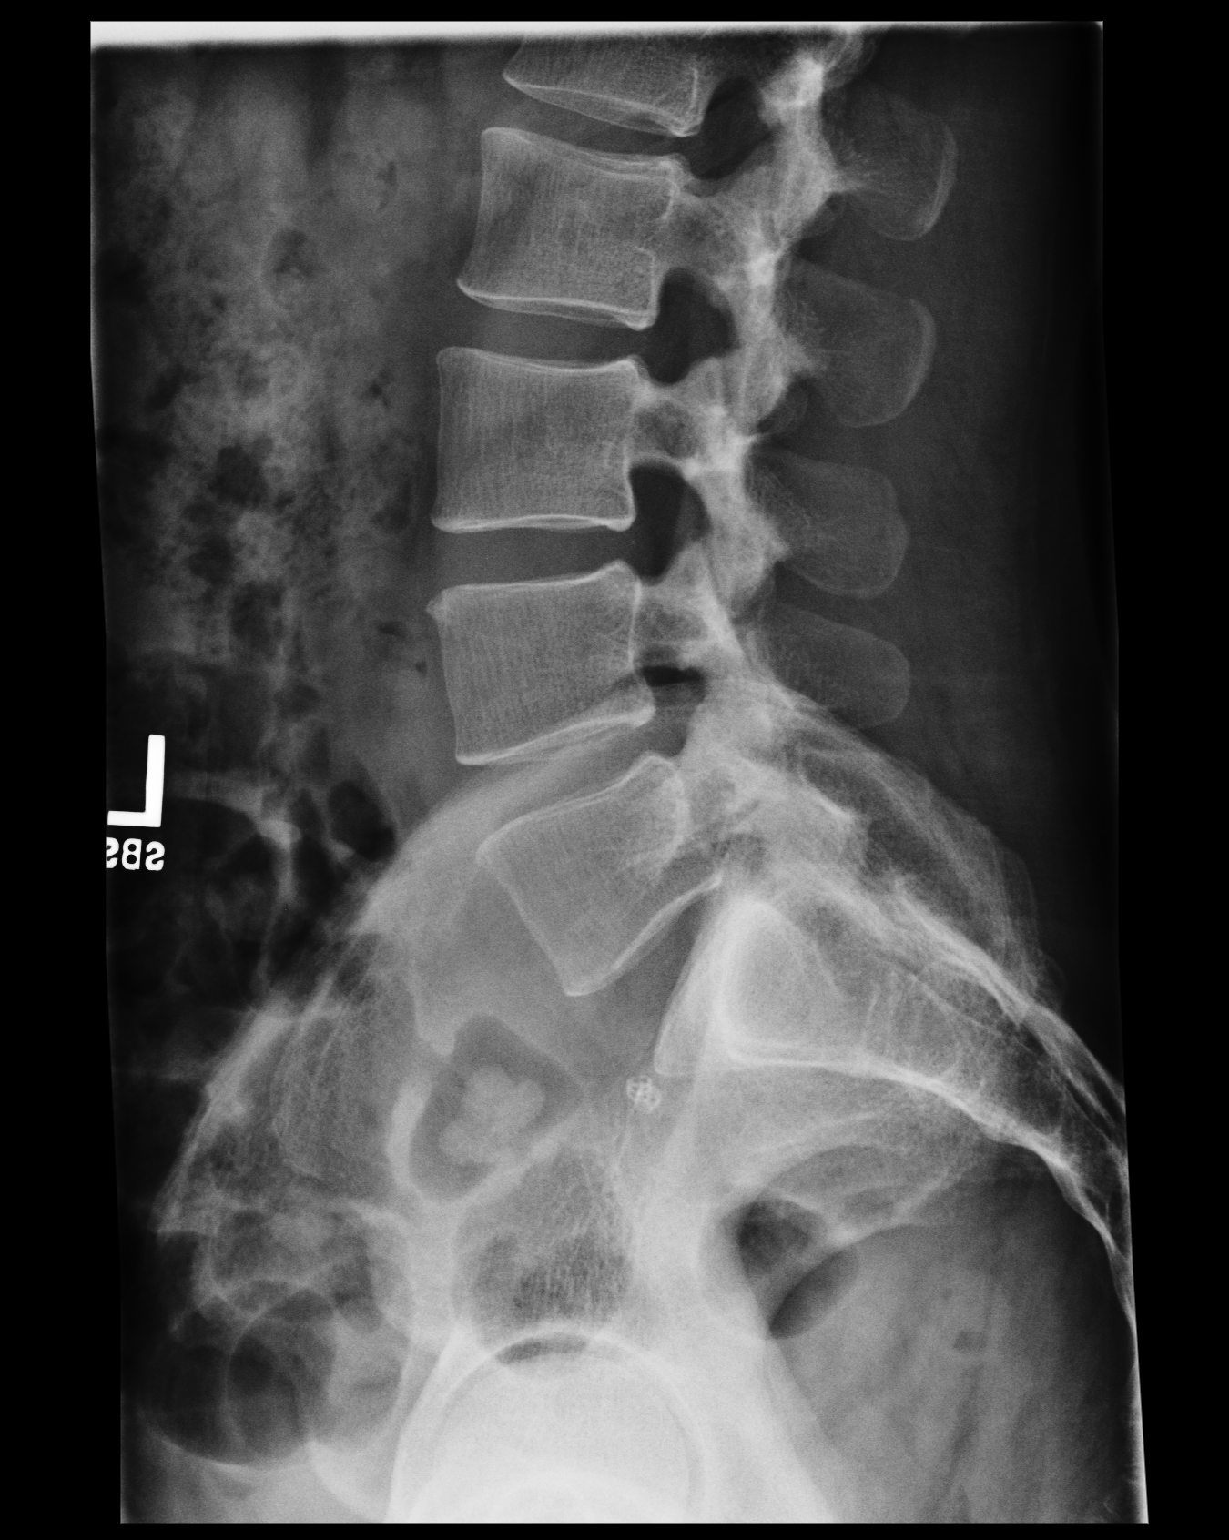

[3 of 3 positions shown; findings below may reference images not displayed]

IMPRESSION: No acute abnormality.

## 2010-02-08 IMAGING — CR PELVIS - 1-2 VIEW
1 series · 1 of 1 positions shown · non-contrast
Comparison: none

REASON FOR EXAM: fall with bilateral hip pain
COMMENTS:

[view not recorded]
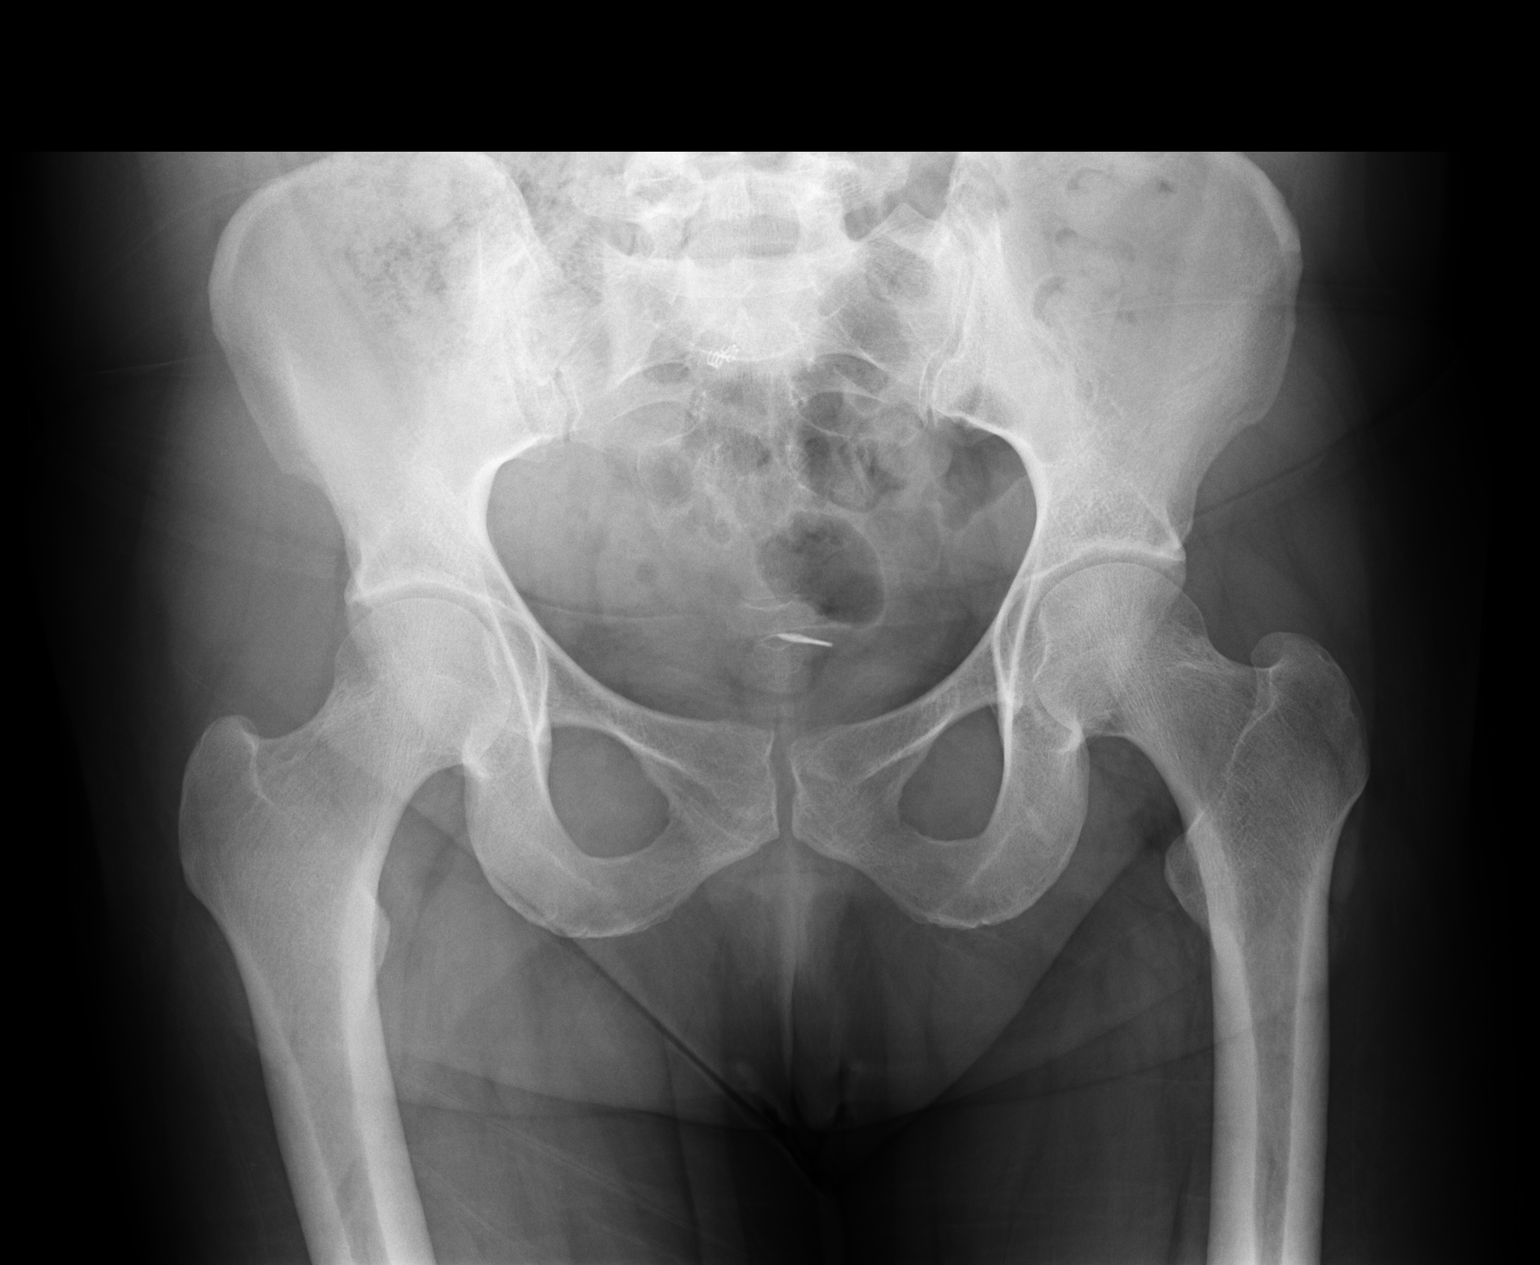

[1 of 1 positions shown; findings below may reference images not displayed]

PROCEDURE:     DXR - DXR PELVIS AP ONLY  - April 05, 2008  [DATE]

RESULT:     The bony pelvis appears adequately mineralized. The hips are
grossly normal. There is a radiodense structure present in the soft tissues
of the pelvis in the midline. The pubic rami appear intact. The observed
portions of the sacrum are intact.
IMPRESSION: I see no acute bony abnormality of the pelvis. Followup imaging is available
if the patient's symptoms warrant this.

## 2010-05-13 IMAGING — CR DG LUMBAR SPINE 2-3V
1 series · 3 of 3 positions shown · non-contrast
Comparison: none

REASON FOR EXAM: lbp after fall
COMMENTS:

[Series 1: view not recorded · 0.17mm/px · 3 of 3 slices shown]
[im 1/3]
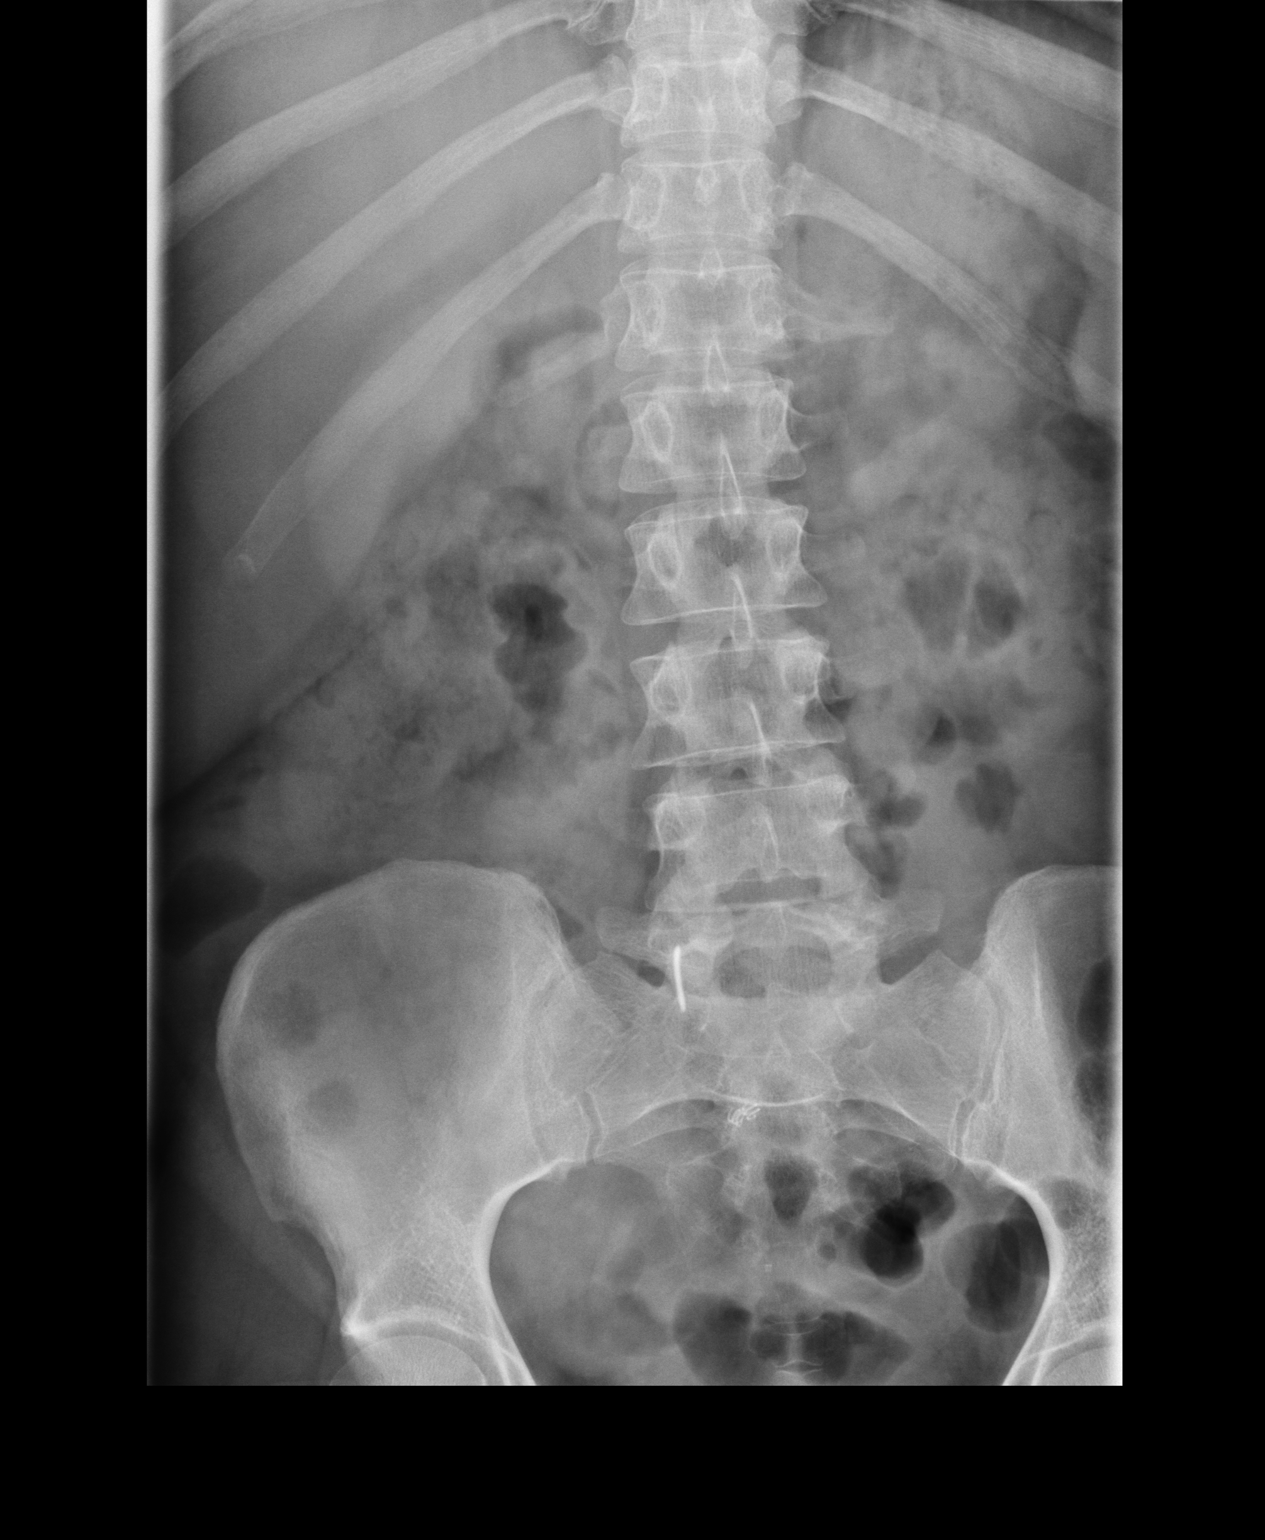
[im 2/3]
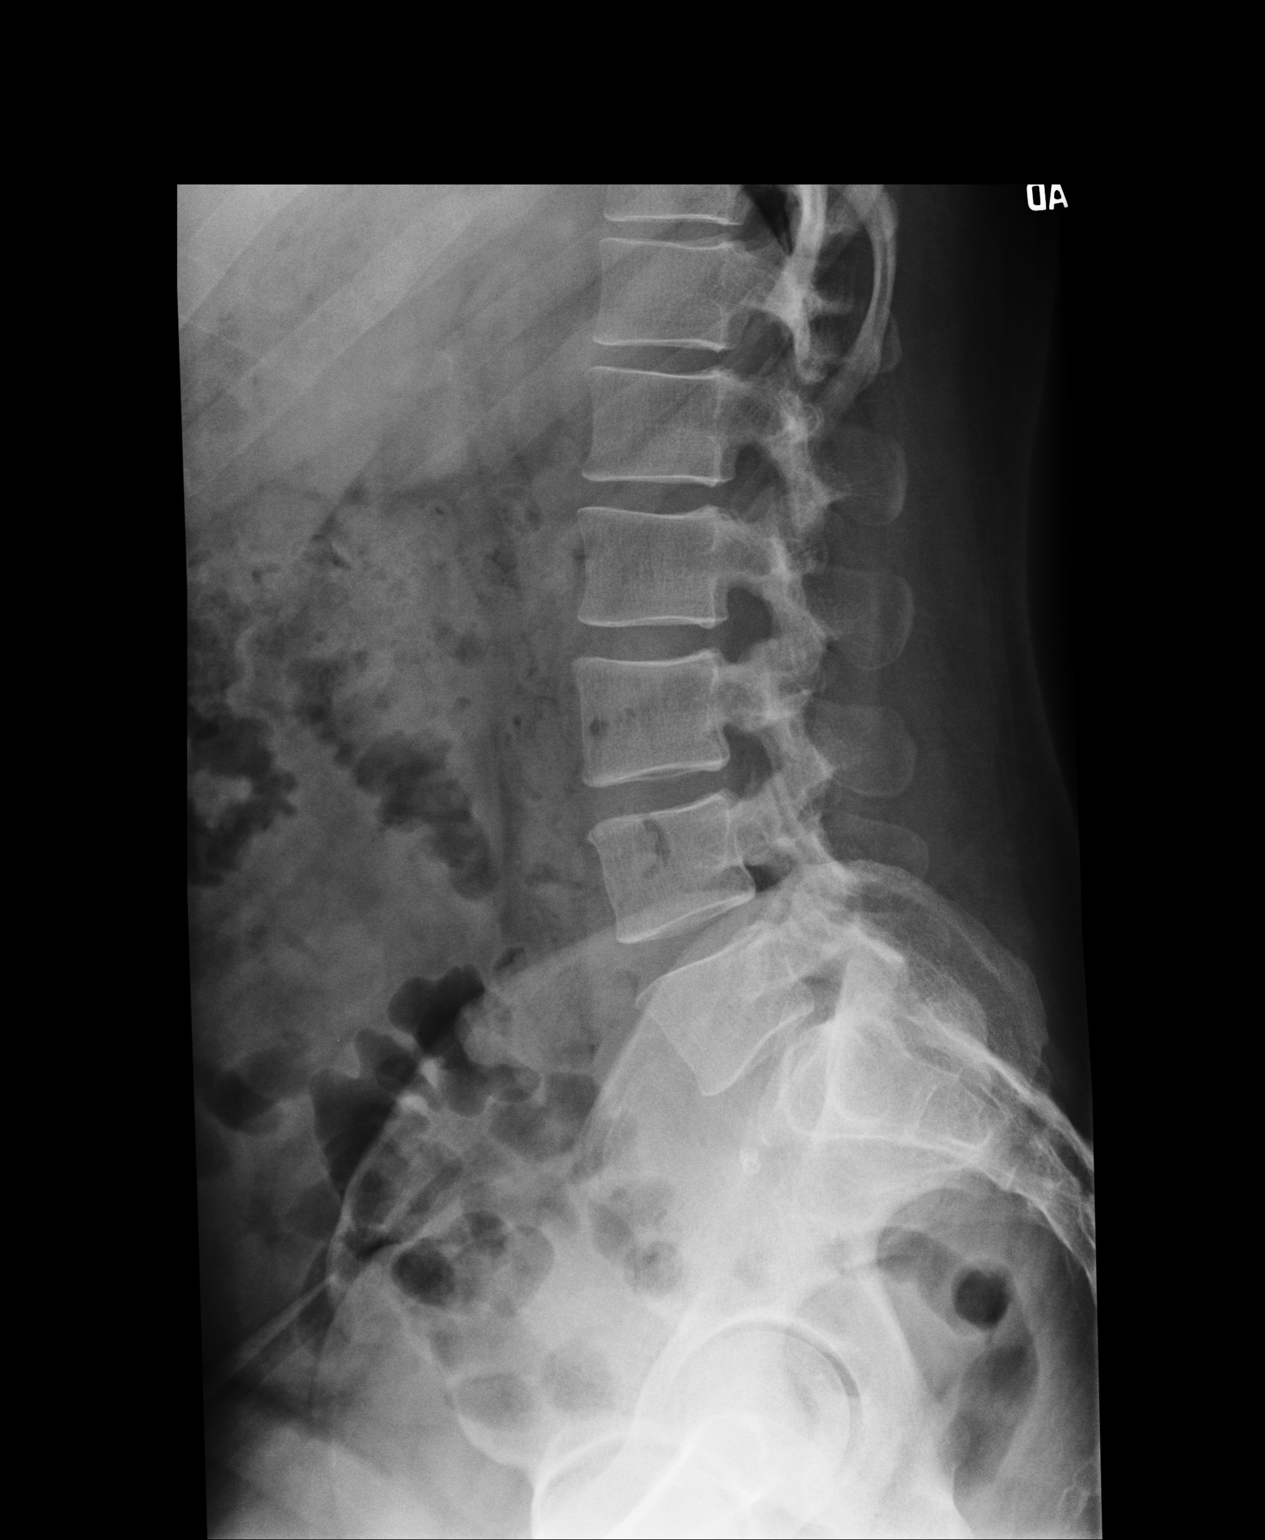
[im 3/3]
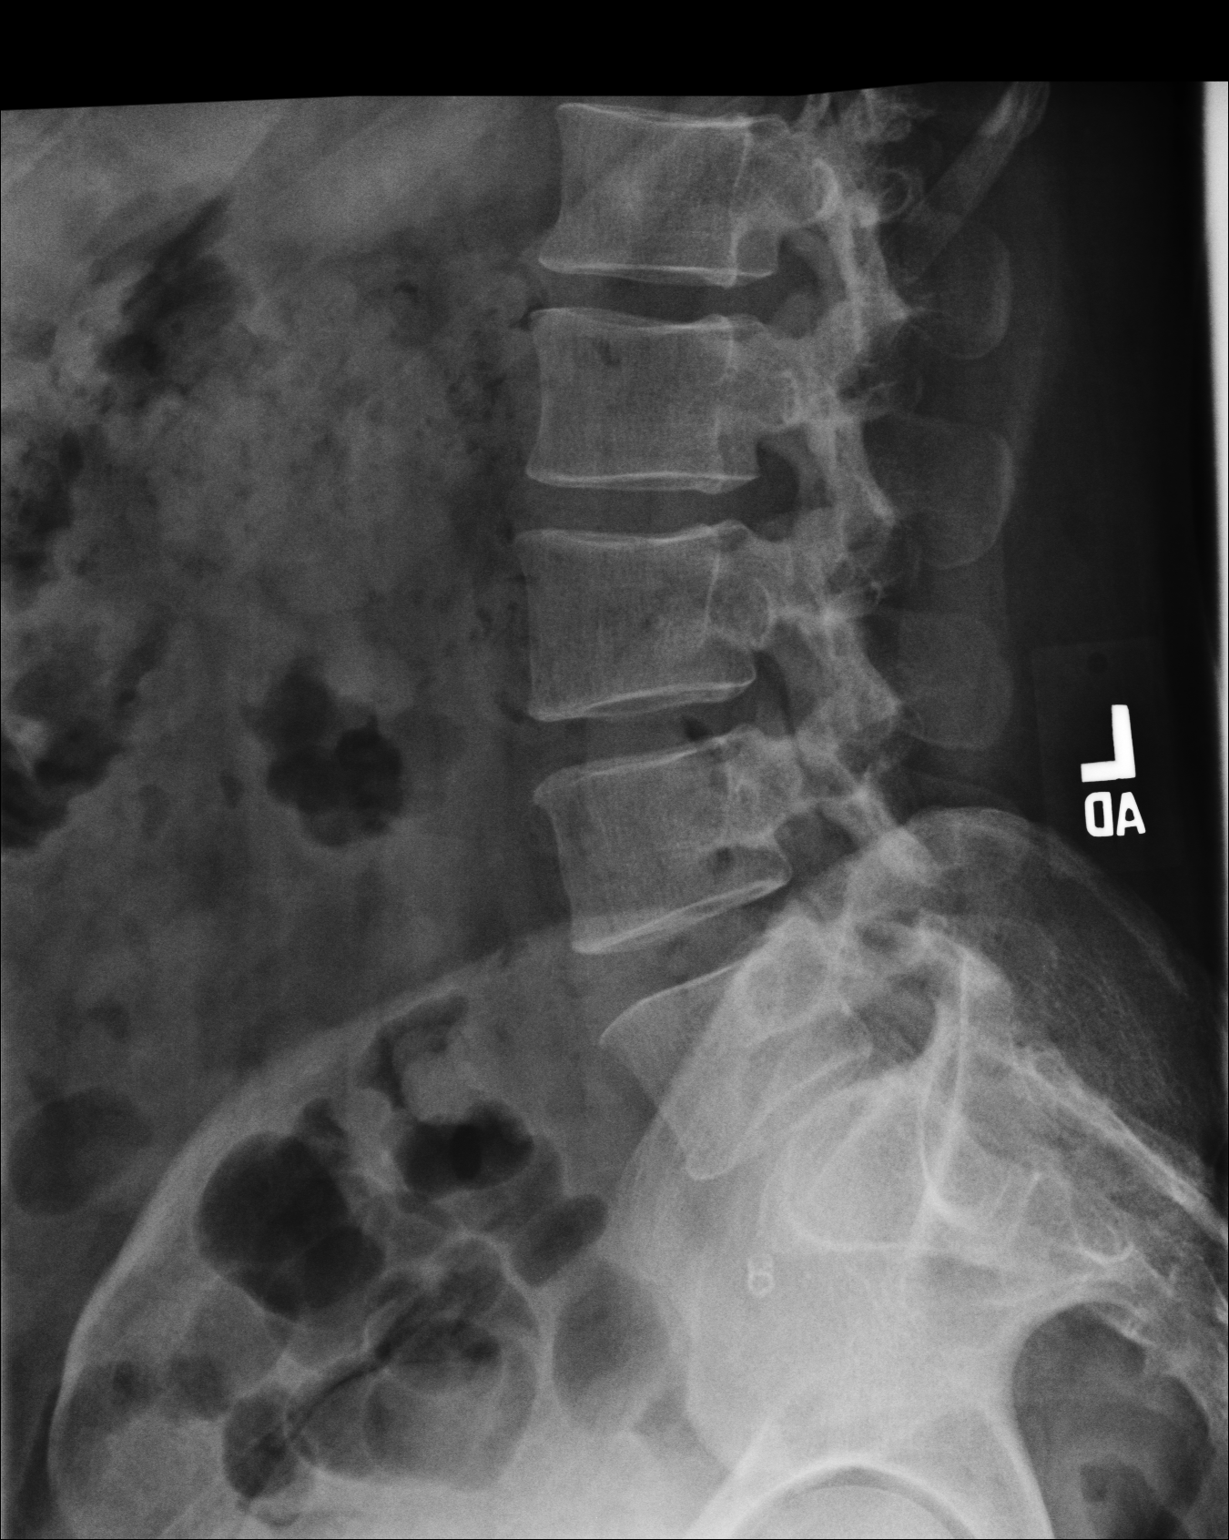

[3 of 3 positions shown; findings below may reference images not displayed]

PROCEDURE:     DXR - DXR LUMBAR SPINE AP AND LATERAL  - July 08, 2008  [DATE]

RESULT:     Comparison is made to a prior exam of 03/30/2008. No significant
interval changes are seen. The vertebral body heights and the intervertebral
disc spaces are well maintained. The vertebral body alignment is normal. The
pedicles are bilaterally intact.
IMPRESSION: 1.     No acute changes are identified.
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
## 2010-06-25 ENCOUNTER — Emergency Department: Payer: Self-pay | Admitting: Emergency Medicine

## 2010-06-27 IMAGING — CR RIGHT INDEX FINGER 2+V
1 series · 2 of 2 positions shown · non-contrast
Comparison: none

REASON FOR EXAM: paiin after injury
COMMENTS:

PROCEDURE:     DXR - DXR FINGER INDEX 2ND DIGIT RT HA  - August 22, 2008 [DATE]
RESULT:     No fracture, dislocation or other acute bony abnormality is
identified. Compared to the prior exam 06/26/2008, no significant interval
changes are seen.

[Series 1: view not recorded · 0.17mm/px · 2 of 2 slices shown]
[im 1/2]
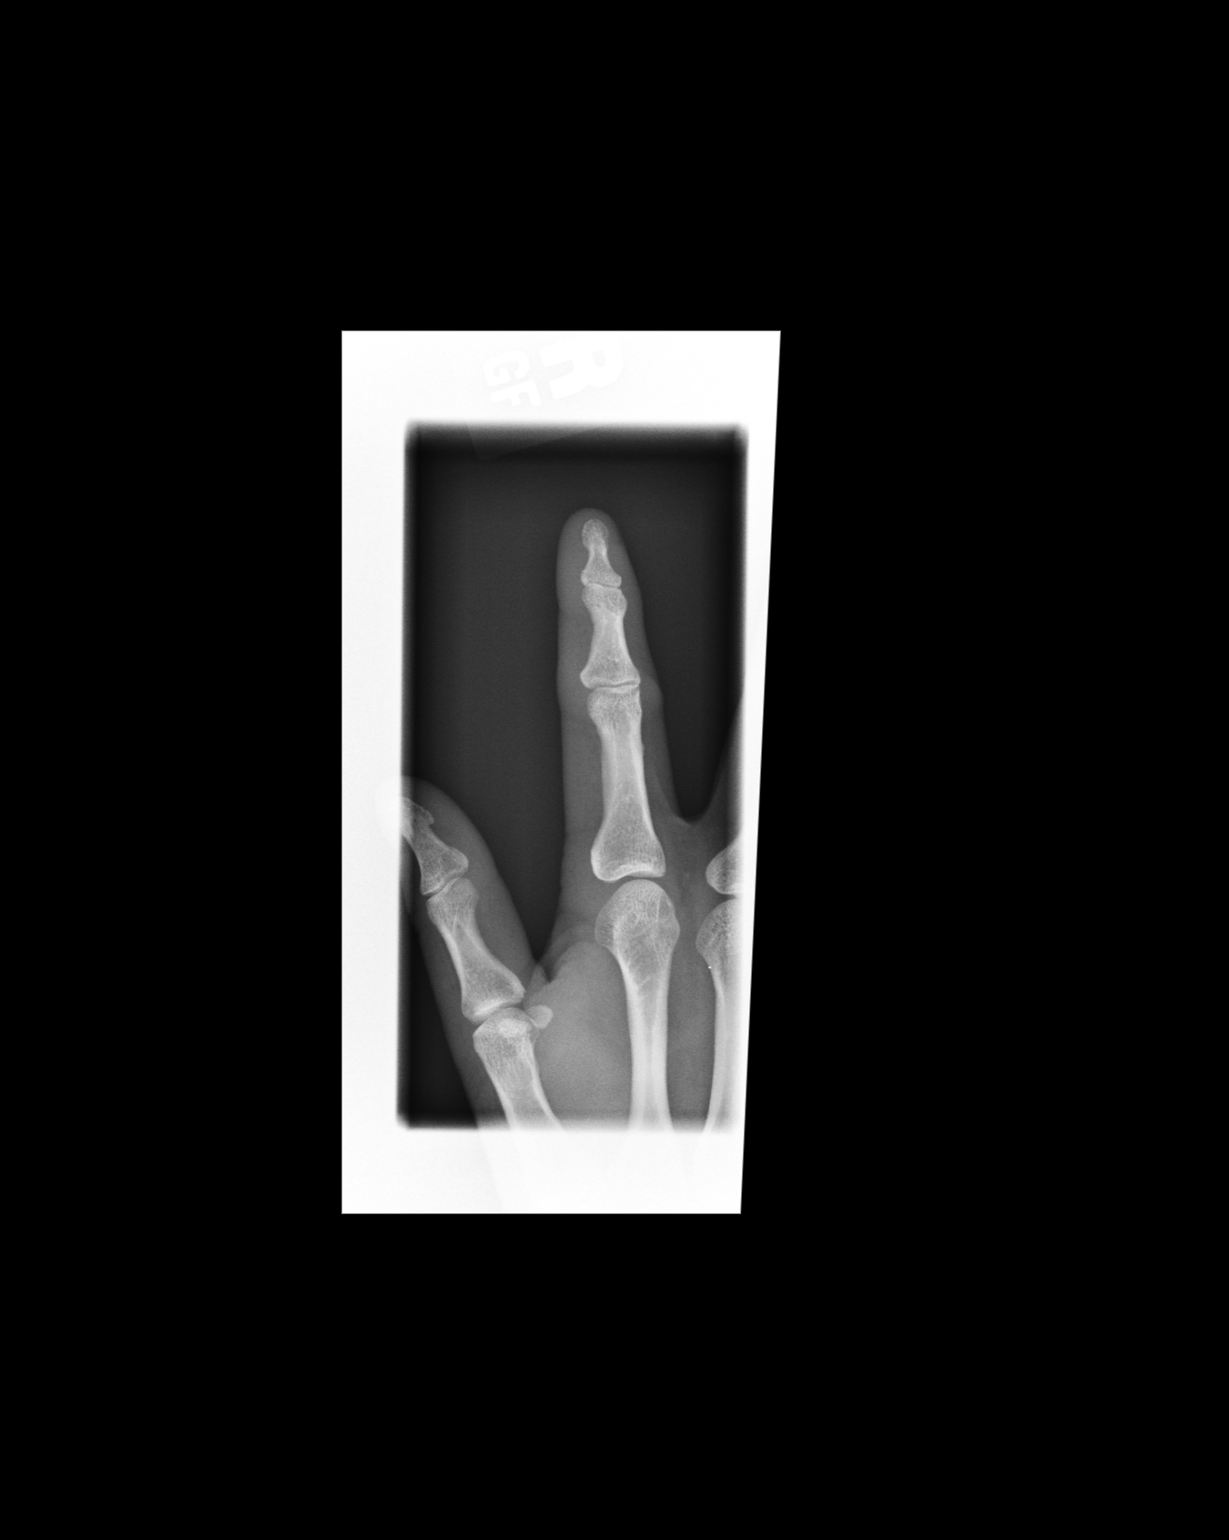
[im 2/2]
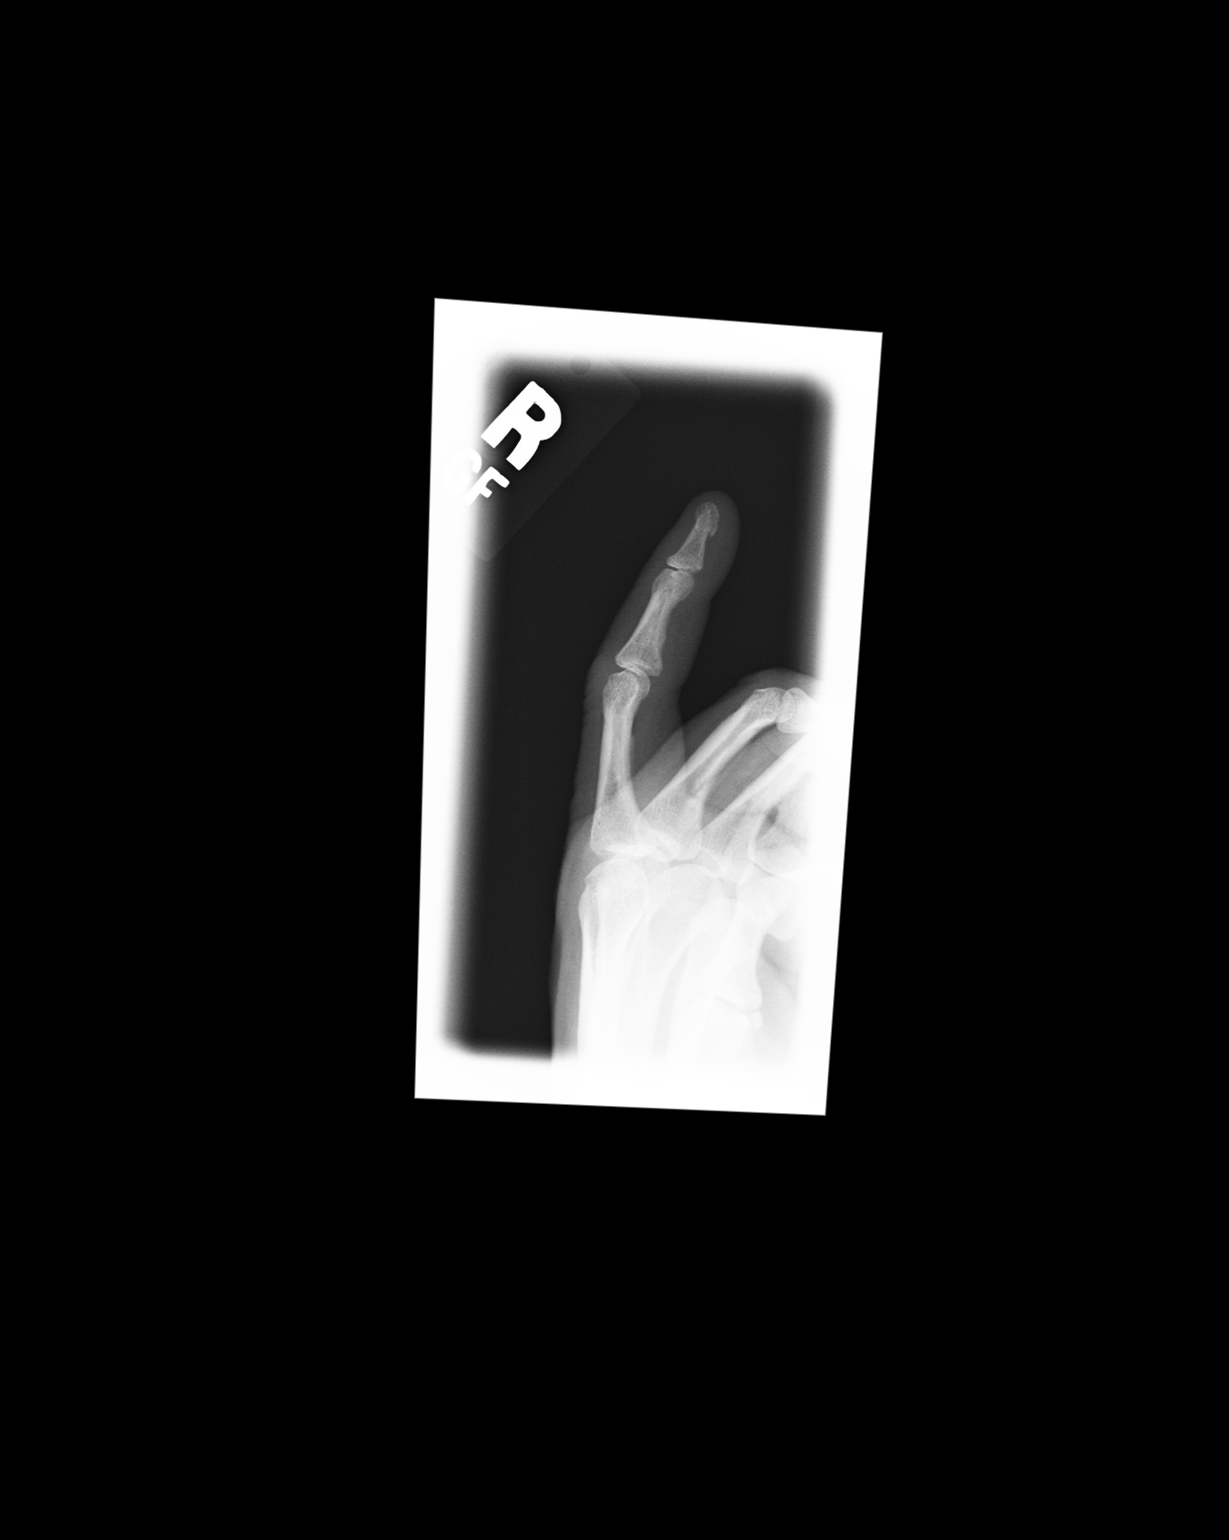

[2 of 2 positions shown; findings below may reference images not displayed]

IMPRESSION: No acute bony abnormalities are identified.

## 2010-06-28 IMAGING — CR SACRUM AND COCCYX - 2+ VIEW
1 series · 3 of 3 positions shown · non-contrast
Comparison: none

REASON FOR EXAM: pain after being hit  by dog
COMMENTS:

PROCEDURE:     DXR - DXR SACRUM AND COCCYX  - August 23, 2008 [DATE]
RESULT:     AP and lateral views were obtained. No fracture or other acute
bony abnormality is identified. No radiodense soft tissue foreign body is
seen.

[Series 1: view not recorded · 0.17mm/px · 3 of 3 slices shown]
[im 1/3]
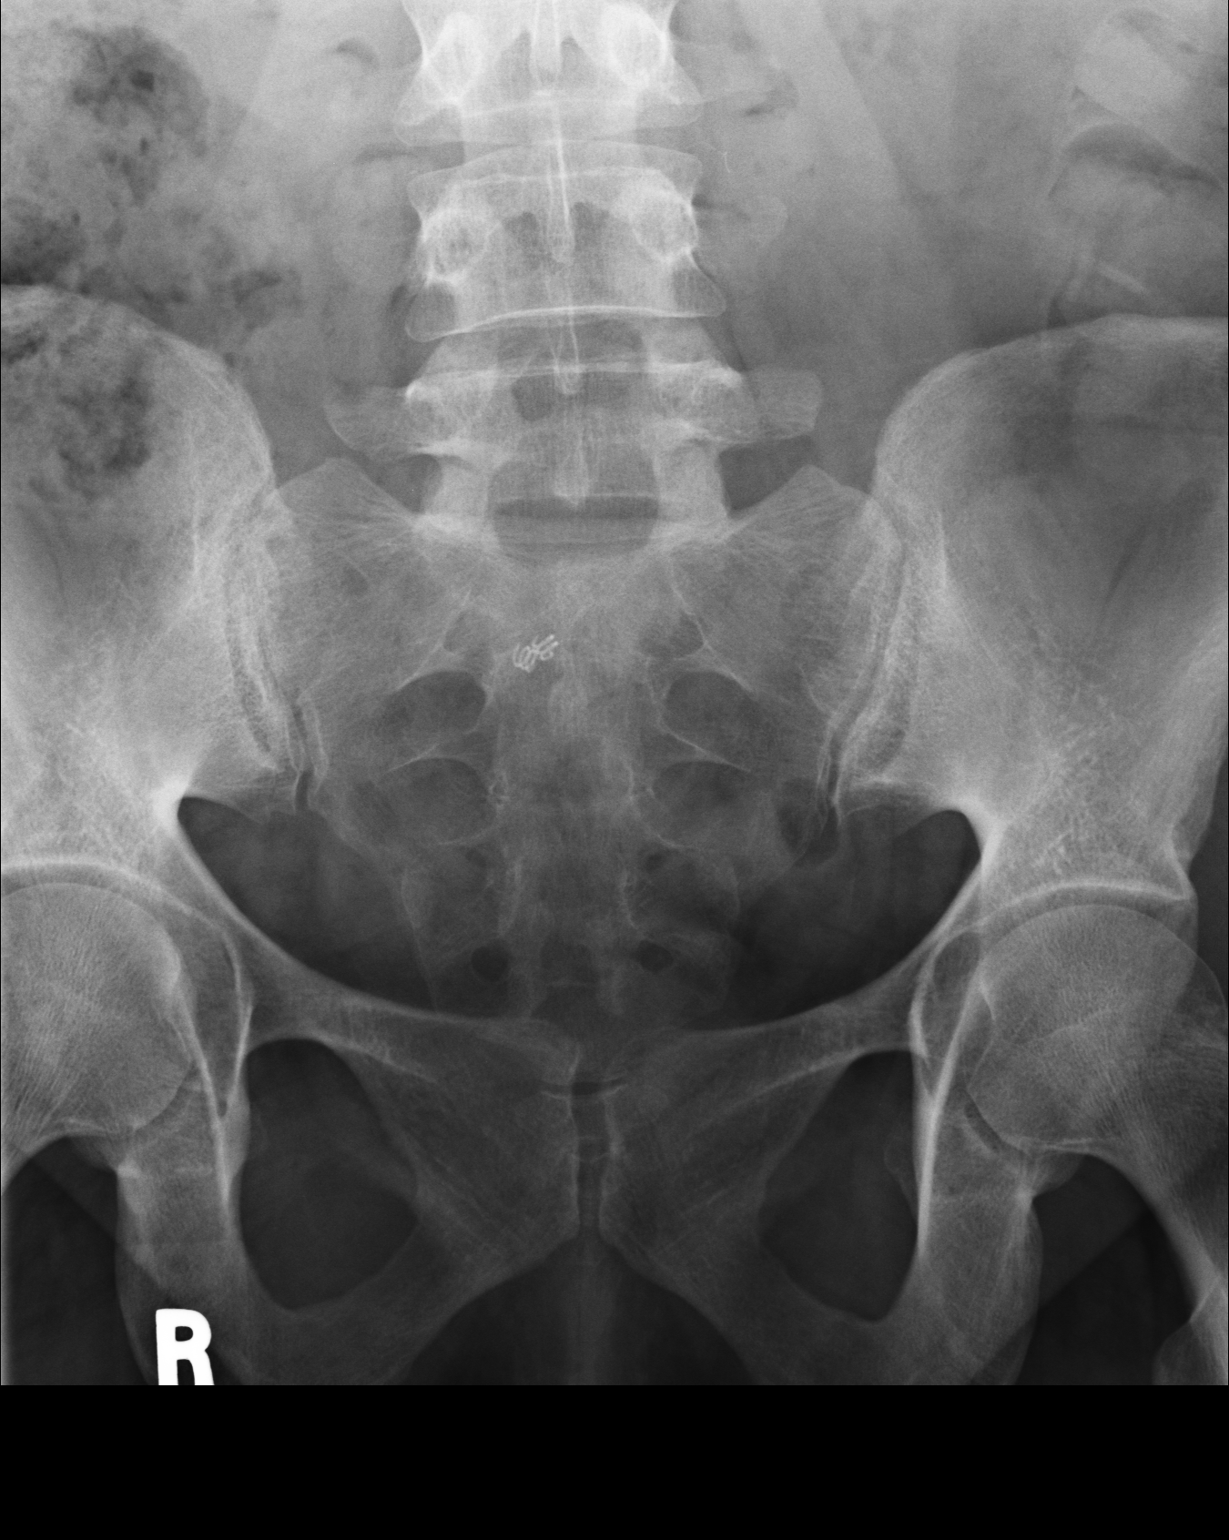
[im 2/3]
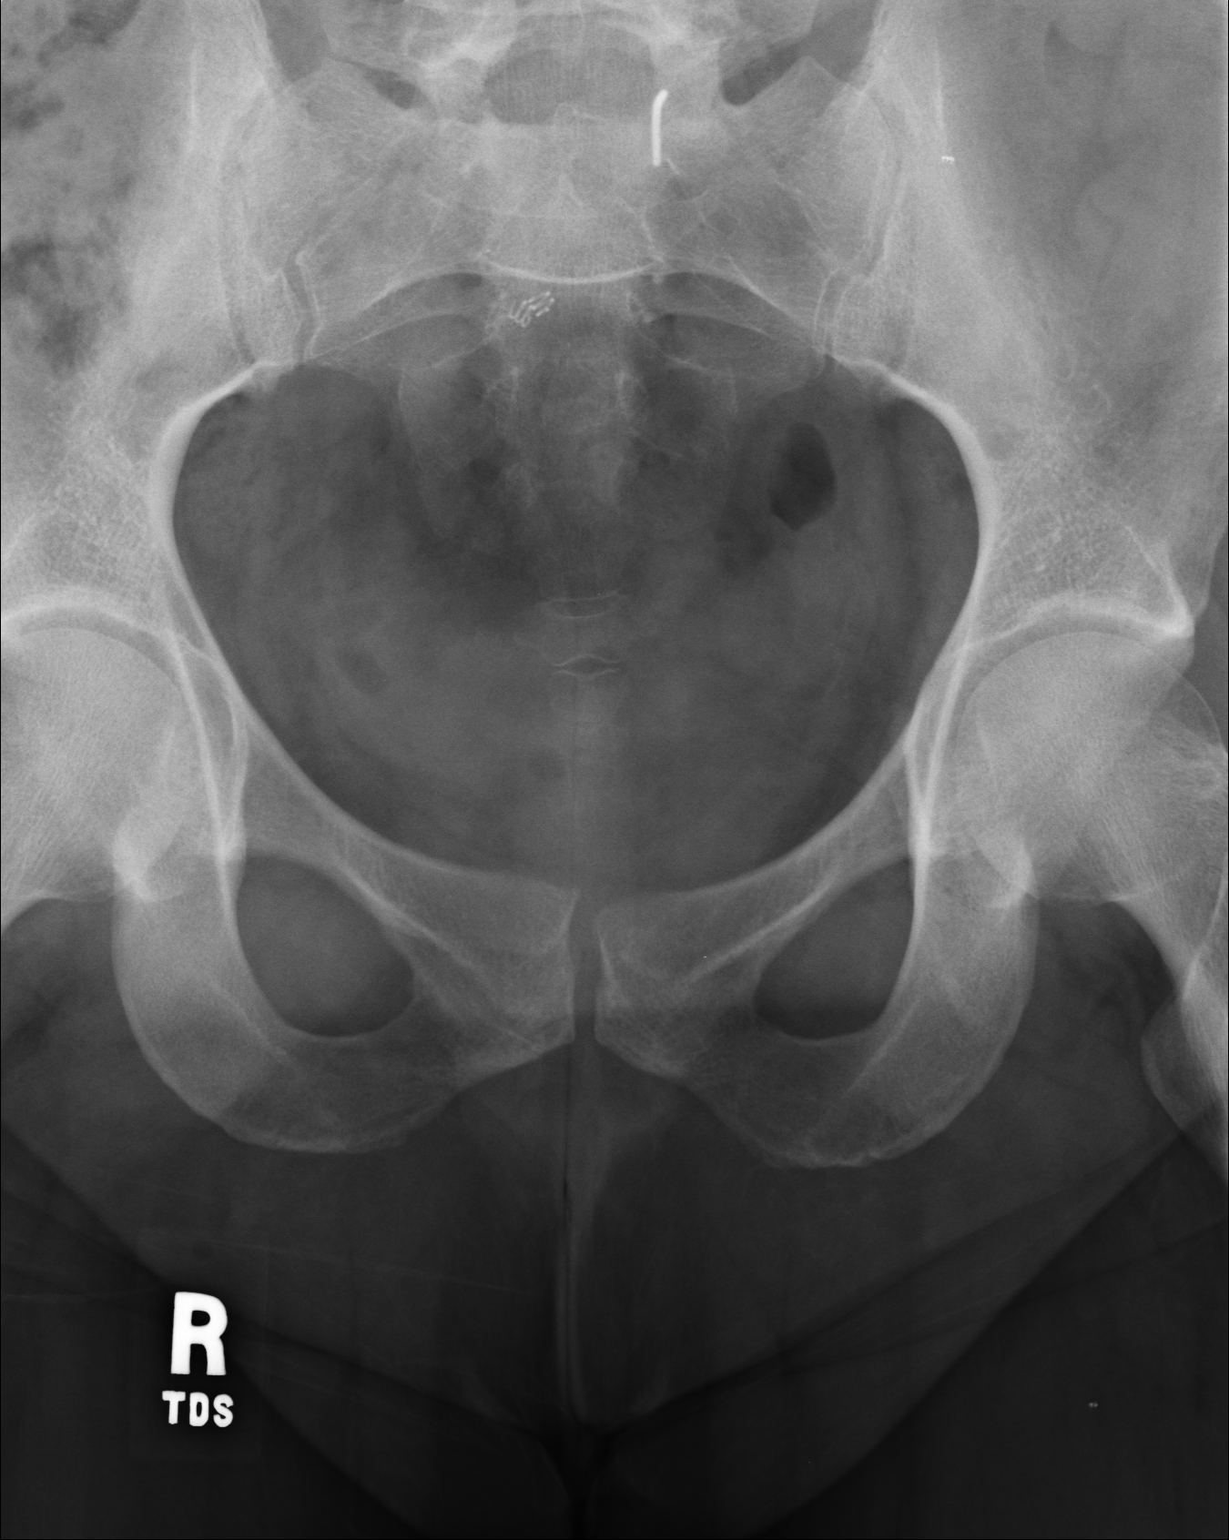
[im 3/3]
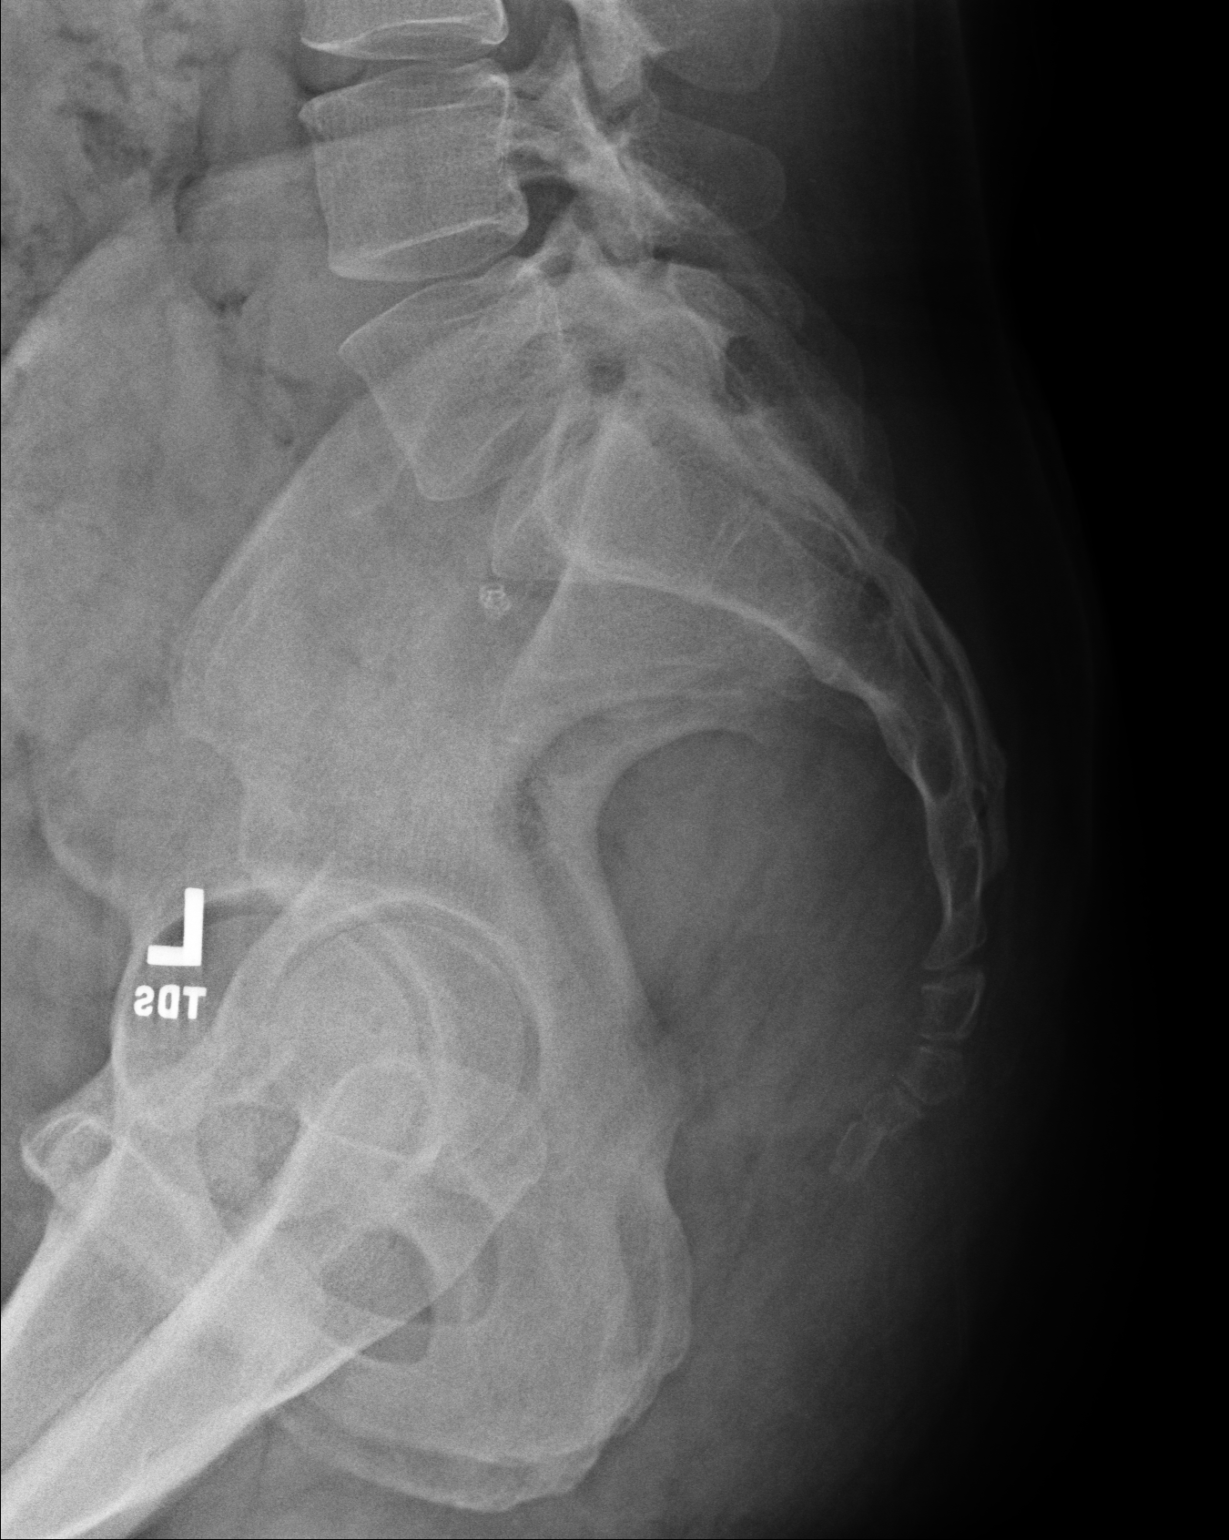

[3 of 3 positions shown; findings below may reference images not displayed]

IMPRESSION: No acute changes are identified.

## 2010-07-22 ENCOUNTER — Observation Stay: Payer: Self-pay | Admitting: Internal Medicine

## 2010-09-05 ENCOUNTER — Ambulatory Visit: Payer: Self-pay | Admitting: Internal Medicine

## 2010-10-19 ENCOUNTER — Emergency Department: Payer: Self-pay | Admitting: Emergency Medicine

## 2010-12-12 ENCOUNTER — Ambulatory Visit: Payer: Self-pay | Admitting: Internal Medicine

## 2011-01-18 IMAGING — CT CT LUMBAR SPINE WITHOUT CONTRAST
2 series · 12 of 27 positions shown, 15 images · non-contrast
Comparison: none

REASON FOR EXAM: fall injury pain, ? fx at pelvis
COMMENTS:   LMP: states no sex since last pd

[Series 3: axials · axial · 0.25mm/px · z∈[-465,-297]mm · 7 of 68 slices shown, 9 images]
[im 6/68  soft-tissue]
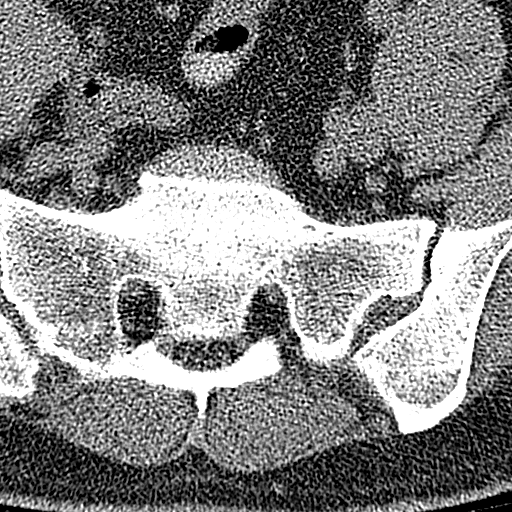
[im 6/68  bone]
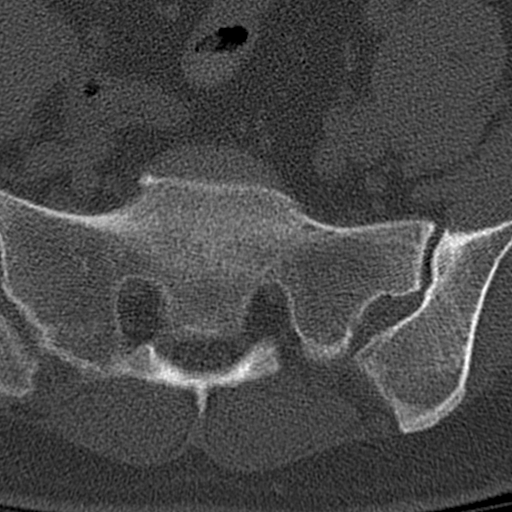
[im 16/68  bone]
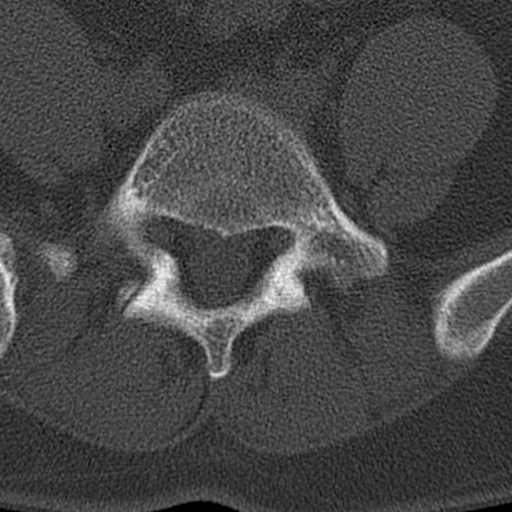
[im 26/68  bone]
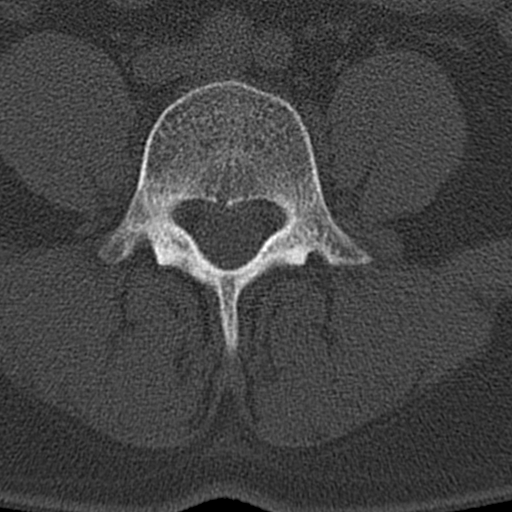
[im 37/68  bone]
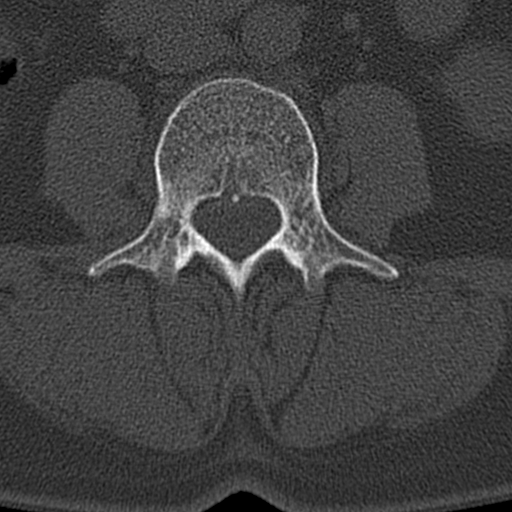
[im 42/68  soft-tissue]
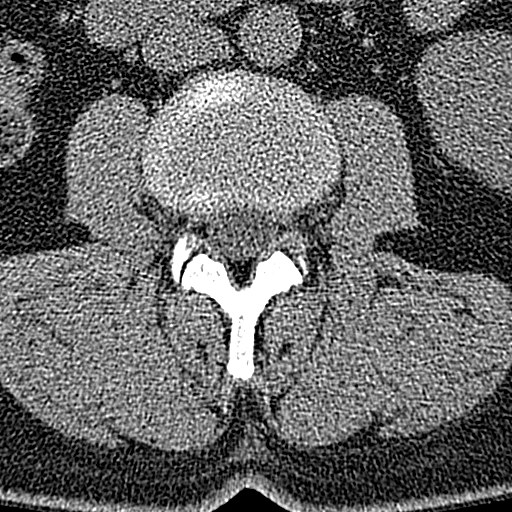
[im 42/68  bone]
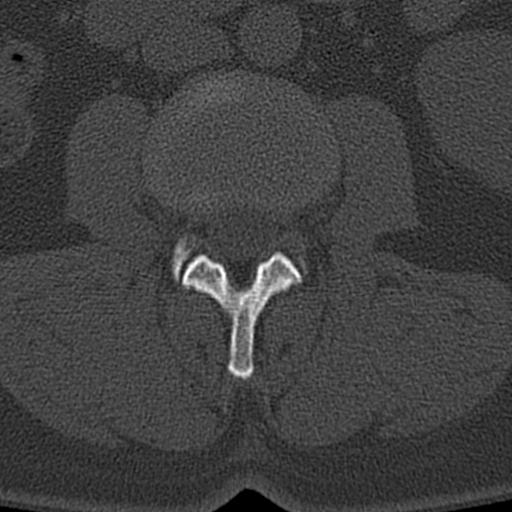
[im 52/68  bone]
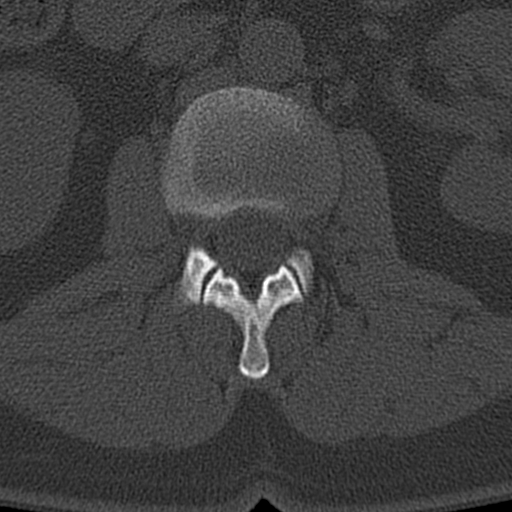
[im 62/68  bone]
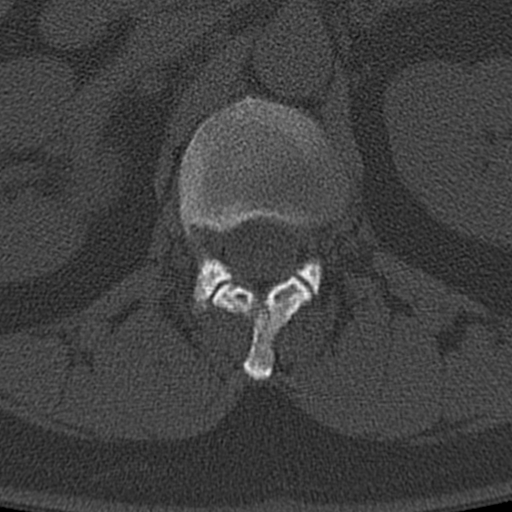

[Series 4: sagittals · sagittal · 0.23mm/px · 5 of 41 slices shown, 6 images]
[im 14/41  bone]
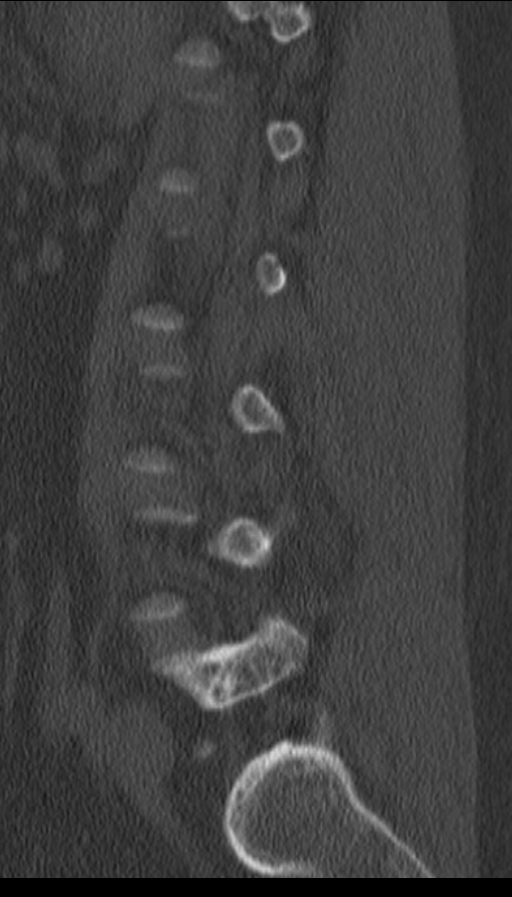
[im 17/41  bone]
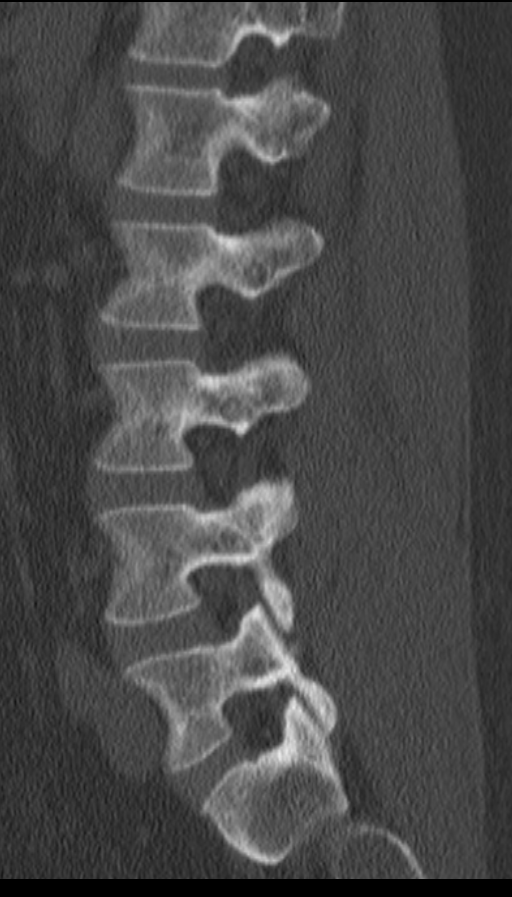
[im 21/41  soft-tissue]
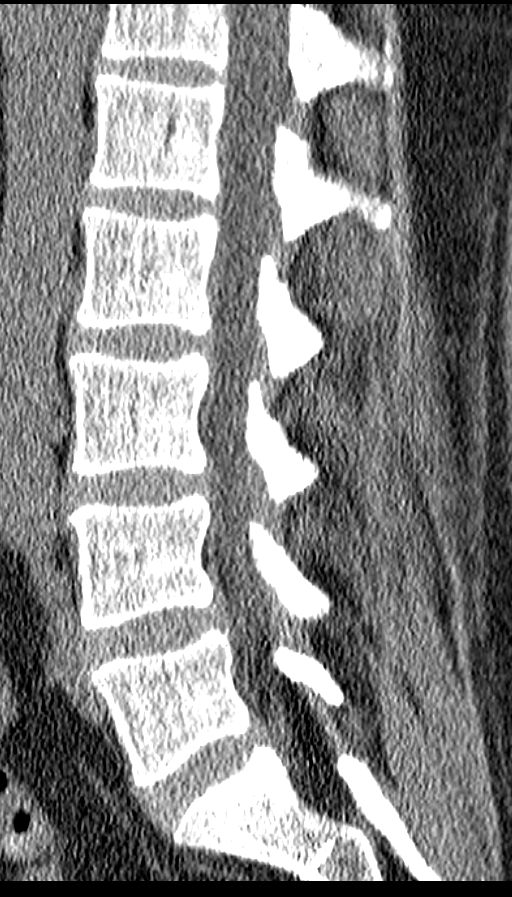
[im 21/41  bone]
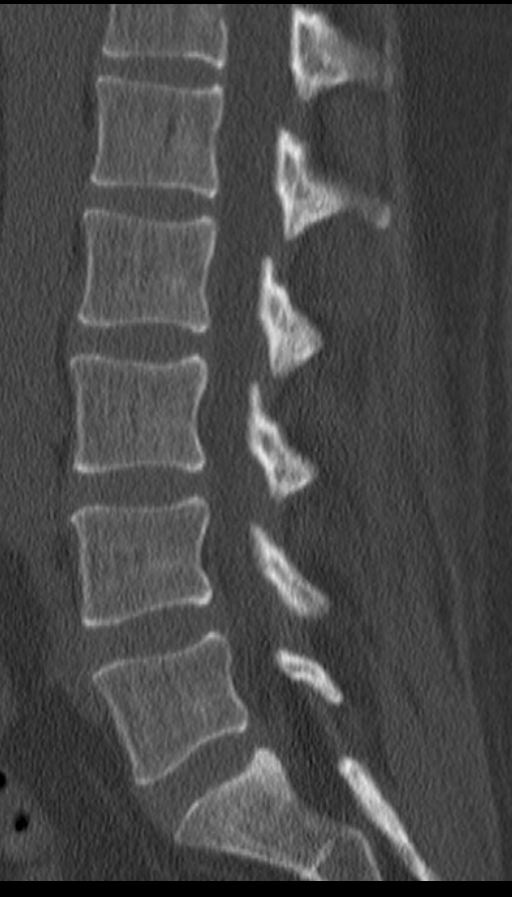
[im 24/41  bone]
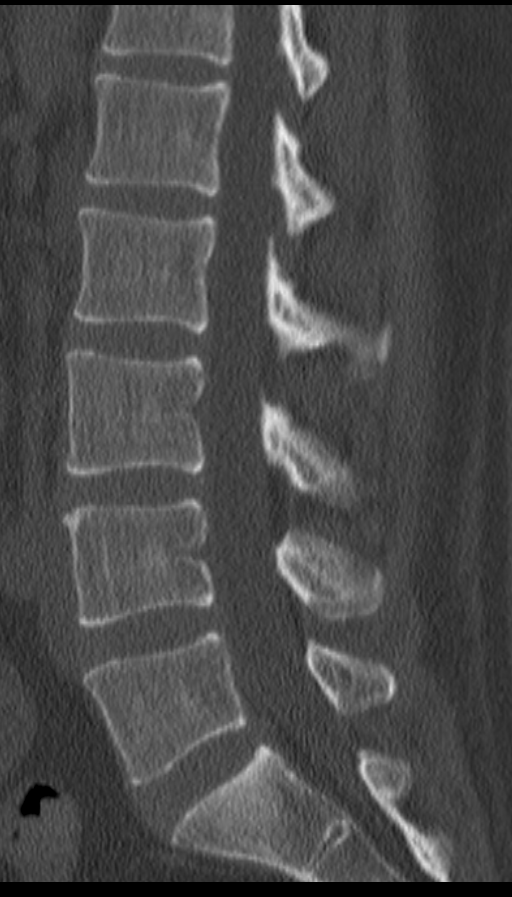
[im 27/41  bone]
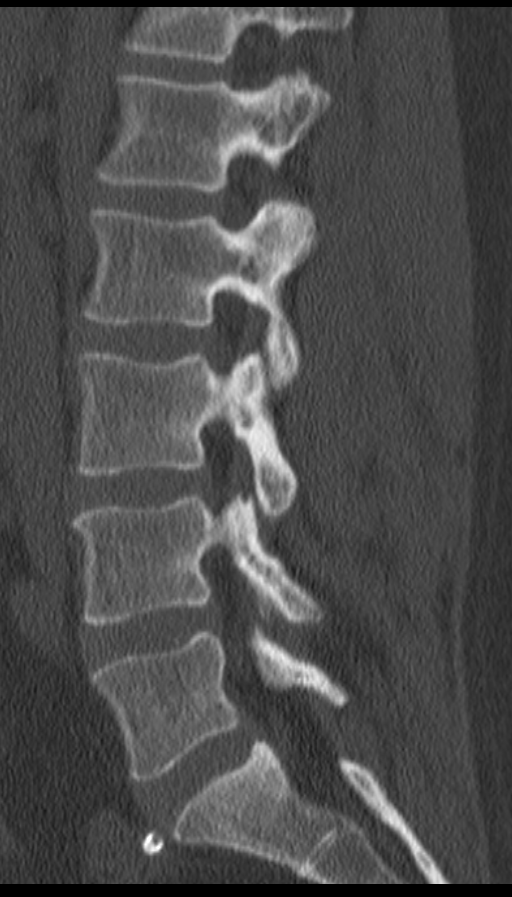

[12 of 27 positions shown; findings below may reference images not displayed]

PROCEDURE:     CT  - CT LUMBAR SPINE WO  - March 15, 2009  [DATE]

RESULT:     Multislice helical acquisition through the lumbar spine is
reconstructed at bone window settings in the axial, coronal and sagittal
planes. There is no previous examination for comparison.

The vertebral body heights and intervertebral disc spaces appear to be
maintained. The facets demonstrate normal alignment. There is no fracture or
bony destruction. The spinous and transverse processes are intact.
IMPRESSION: No acute bony abnormality of the lumbar spine.

## 2011-03-06 IMAGING — CR DG CHEST 2V
1 series · 2 of 2 positions shown · non-contrast
Comparison: none

REASON FOR EXAM: fell c/o rib pain esp on l
COMMENTS:

[Series 1: view not recorded · 0.17mm/px · 2 of 2 slices shown]
[im 1/2]
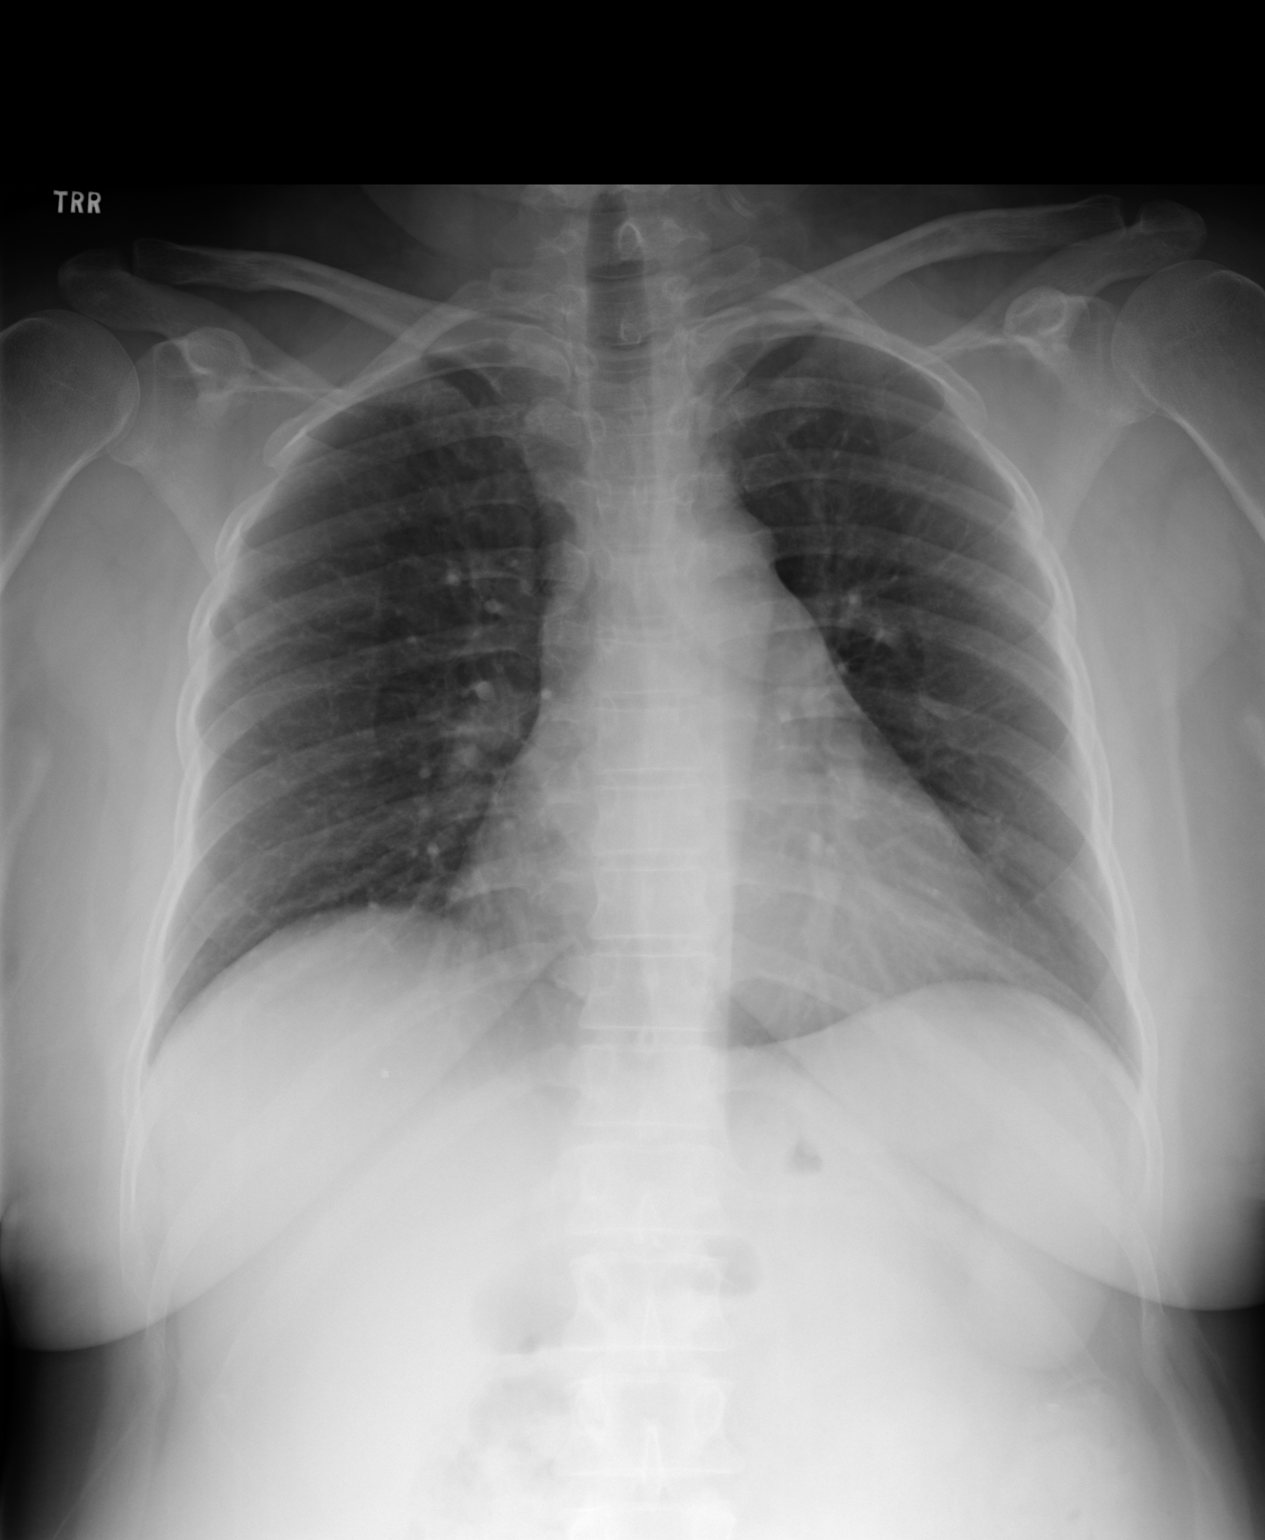
[im 2/2]
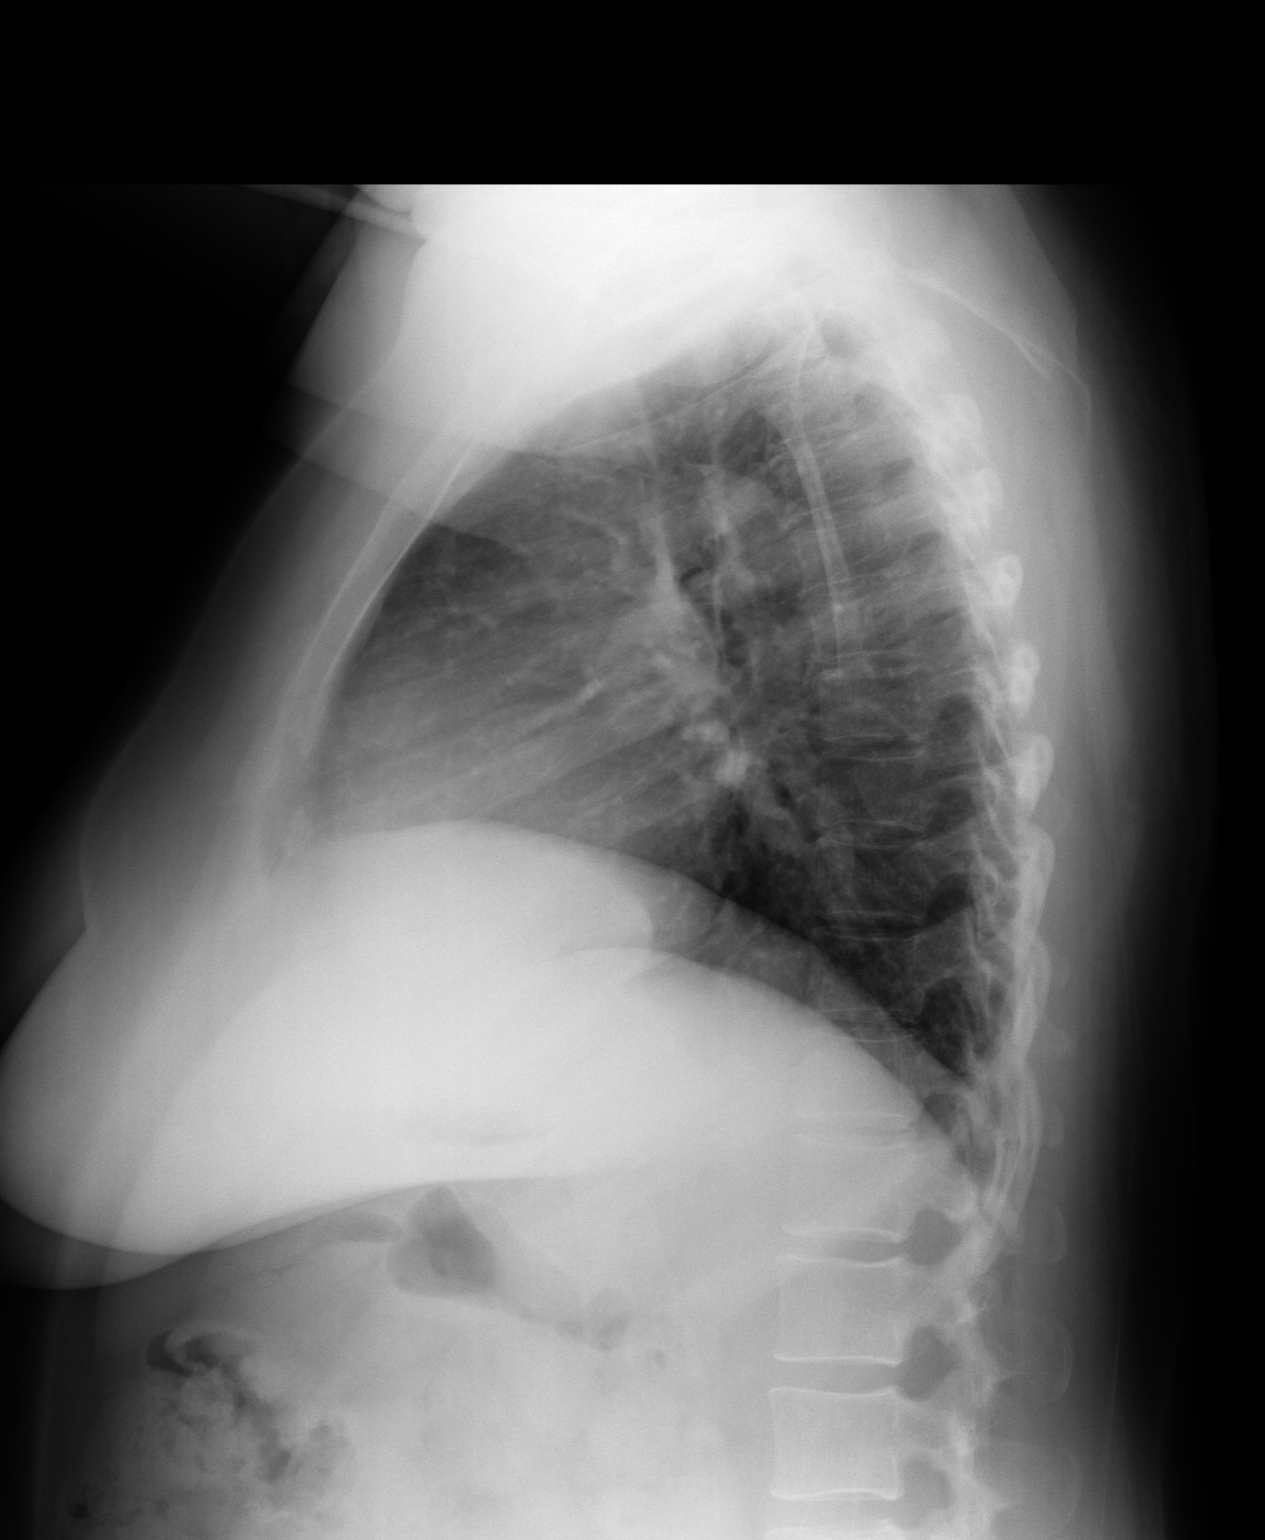

[2 of 2 positions shown; findings below may reference images not displayed]

PROCEDURE:     DXR - DXR CHEST PA (OR AP) AND LATERAL  - June 26, 2010 [DATE]

RESULT:     Comparison is made to the prior exam of 05/09/2009.

The lung fields are clear. No pneumonia, pneumothorax or pleural effusion is
seen. The heart size is within normal limits. Mediastinal and osseous
structures show no significant abnormalities.
IMPRESSION: No acute changes are identified.

## 2011-03-06 IMAGING — CR SACRUM AND COCCYX - 2+ VIEW
1 series · 3 of 3 positions shown · non-contrast
Comparison: none

REASON FOR EXAM: fell pain
COMMENTS:

PROCEDURE:     DXR - DXR SACRUM AND COCCYX  - June 26, 2010 [DATE]
RESULT:     No fracture or other acute bony abnormality is identified.
Compared to a prior exam of 08/23/2008, no significant interval changes are
seen.

[Series 1: view not recorded · 0.17mm/px · 3 of 3 slices shown]
[im 1/3]
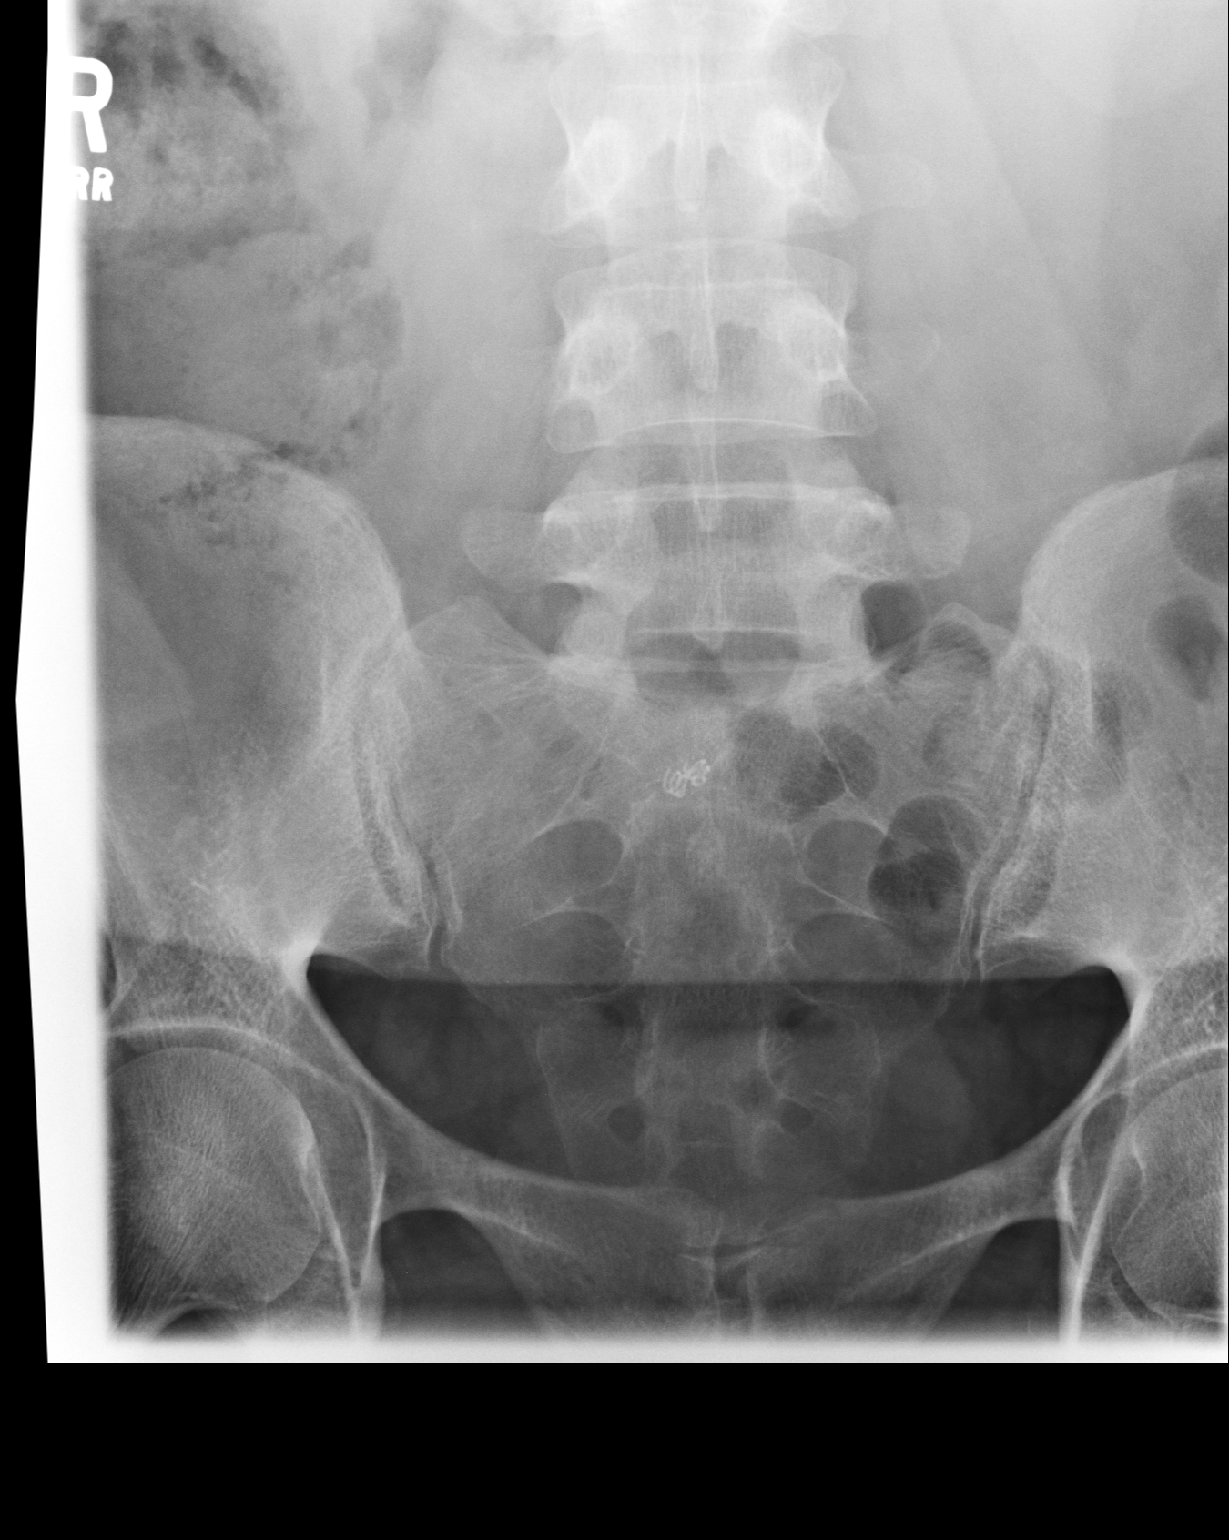
[im 2/3]
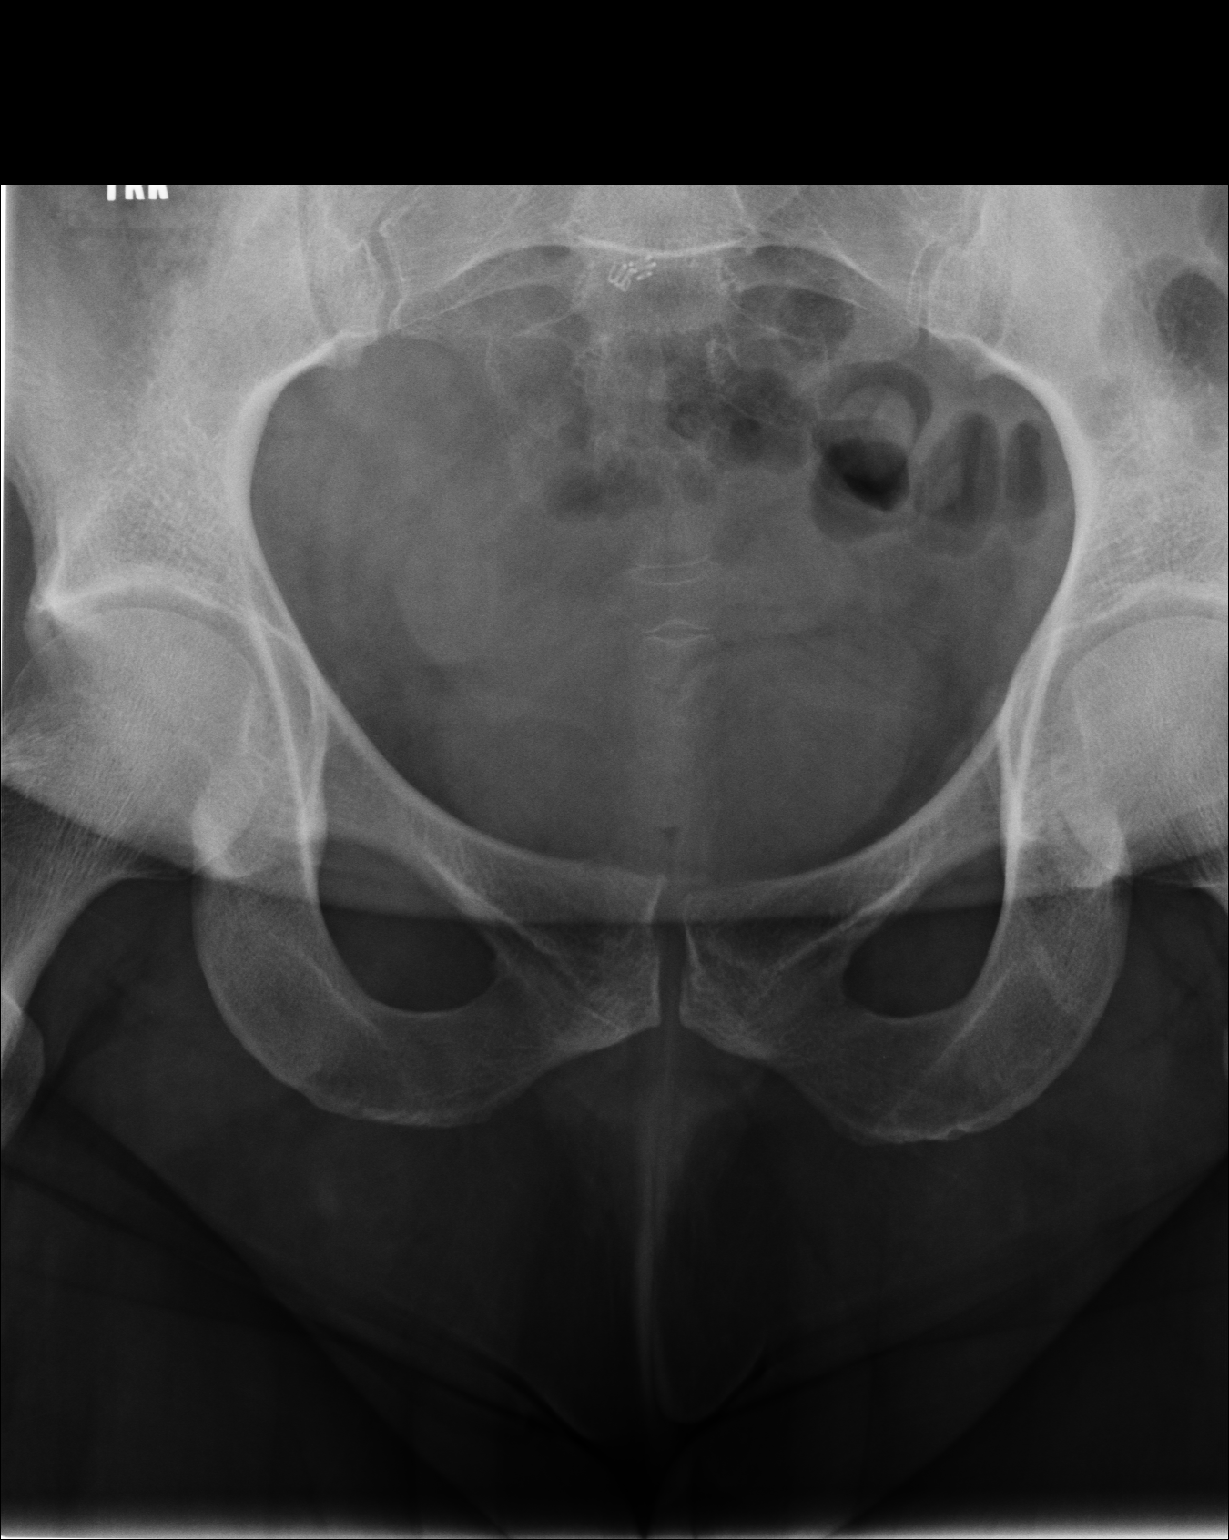
[im 3/3]
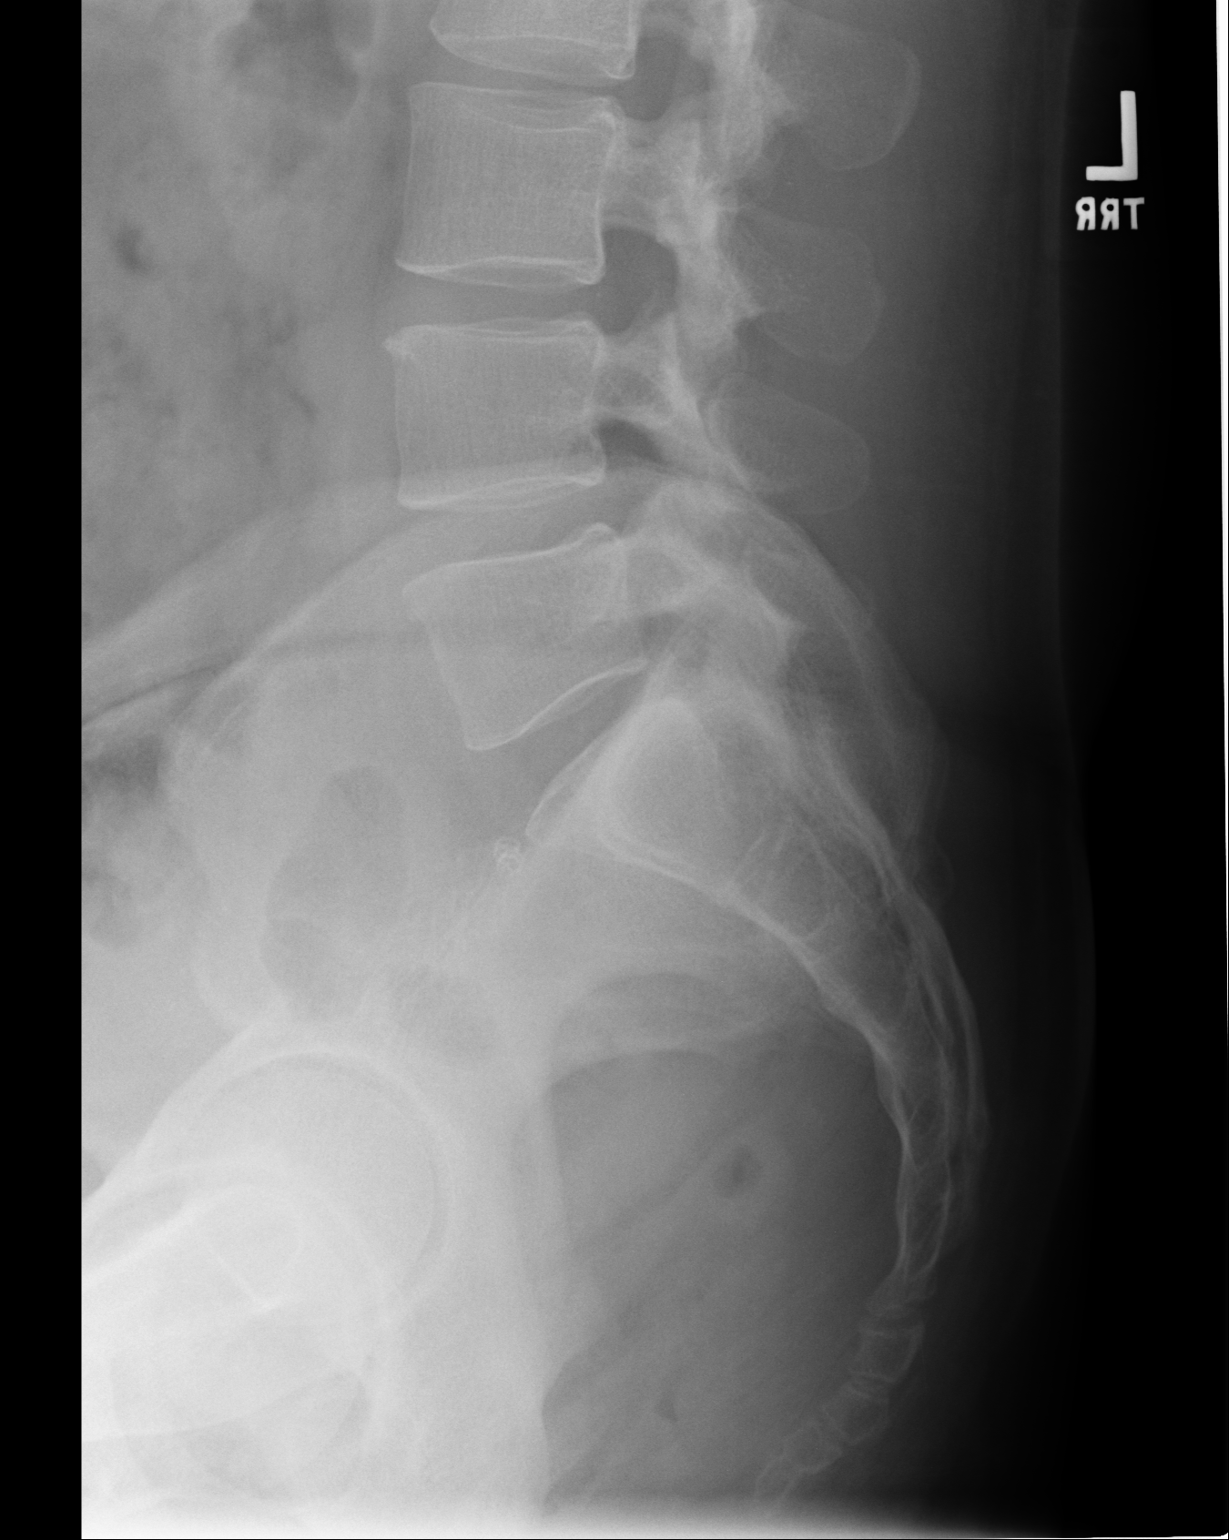

[3 of 3 positions shown; findings below may reference images not displayed]

IMPRESSION: No significant abnormalities are noted.

## 2011-03-31 IMAGING — CT CT CERVICAL SPINE WITHOUT CONTRAST
1 series · 12 of 14 positions shown, 15 images · non-contrast
Comparison: none

REASON FOR EXAM: fall w/ neck pain
COMMENTS:

PROCEDURE:     CT  - CT CERVICAL SPINE WO  - May 26, 2009  [DATE]
RESULT:     Findings: Standard CT of the cervical spine obtained. There is
no evidence of fracture or dislocation. Mild straightening of the cervical
spine is noted.

[Series 5: axial · axial · 0.24mm/px · z∈[-120,-17]mm · 12 of 74 slices shown, 15 images]
[im 6/74  soft-tissue]
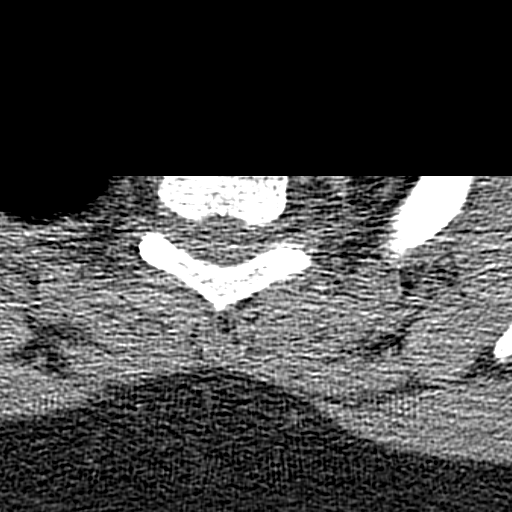
[im 6/74  bone]
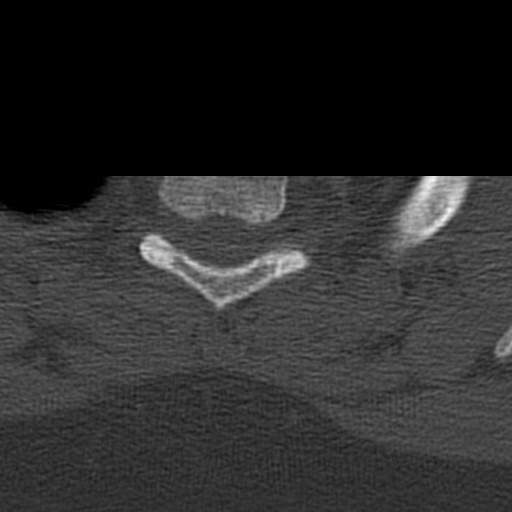
[im 12/74  bone]
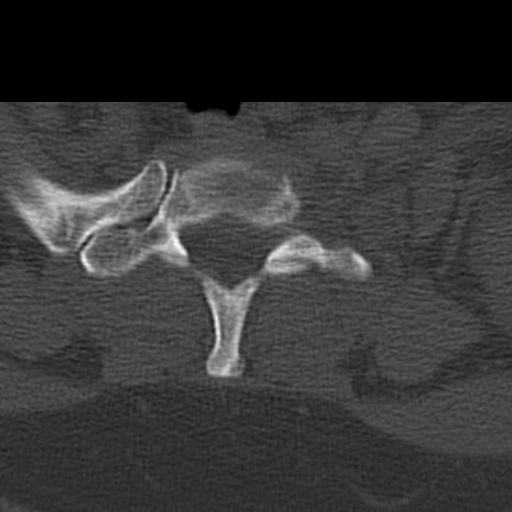
[im 17/74  bone]
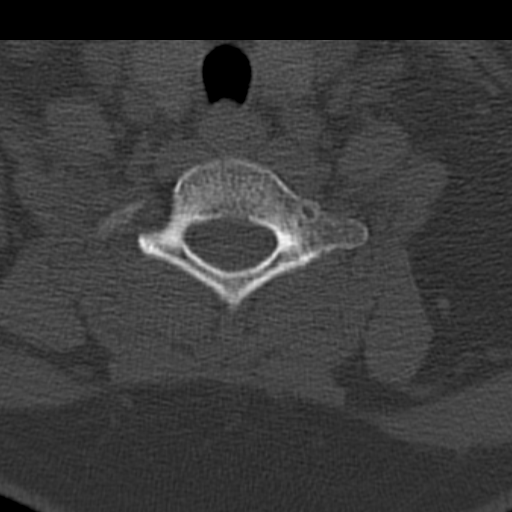
[im 23/74  bone]
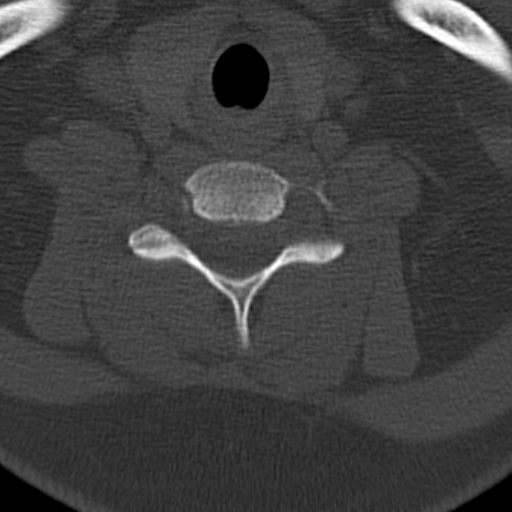
[im 29/74  soft-tissue]
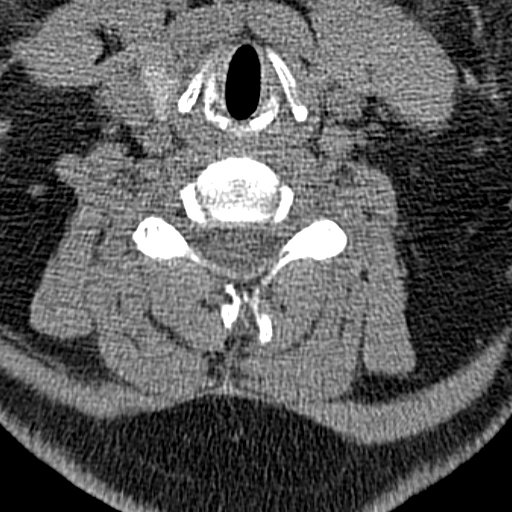
[im 29/74  bone]
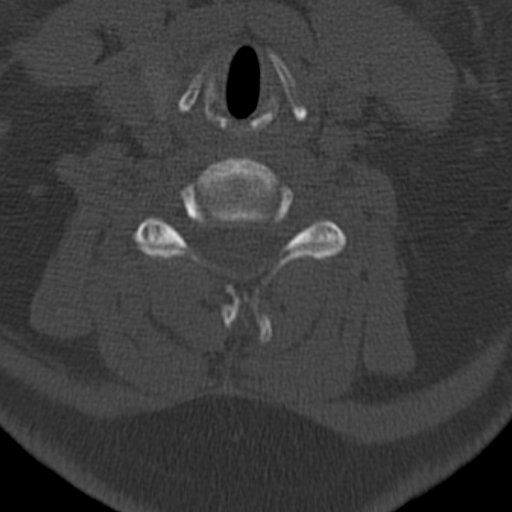
[im 34/74  bone]
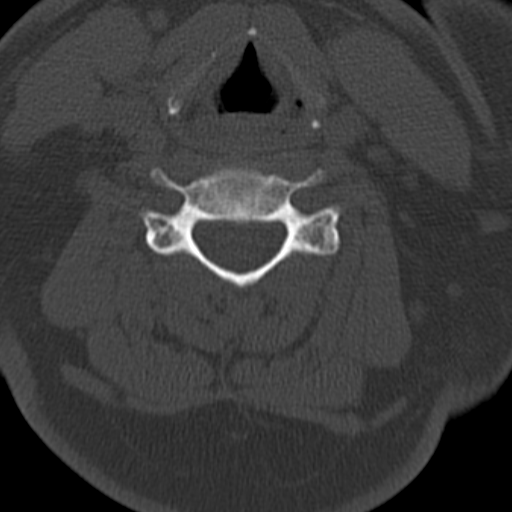
[im 40/74  bone]
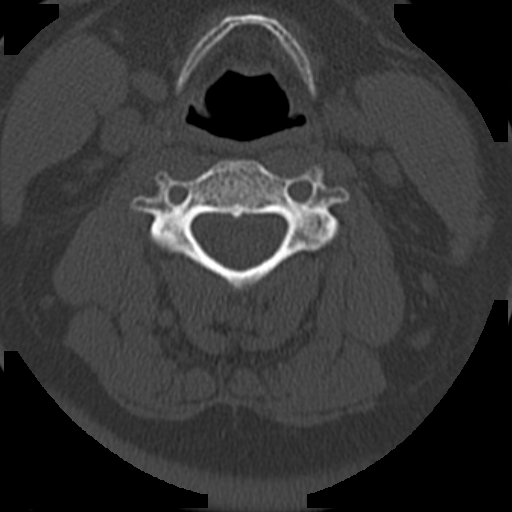
[im 45/74  bone]
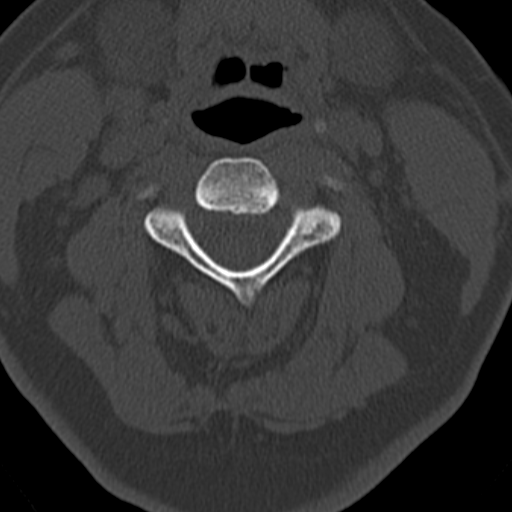
[im 51/74  soft-tissue]
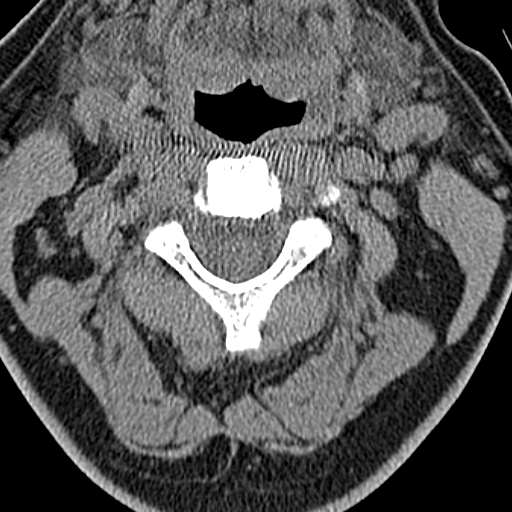
[im 51/74  bone]
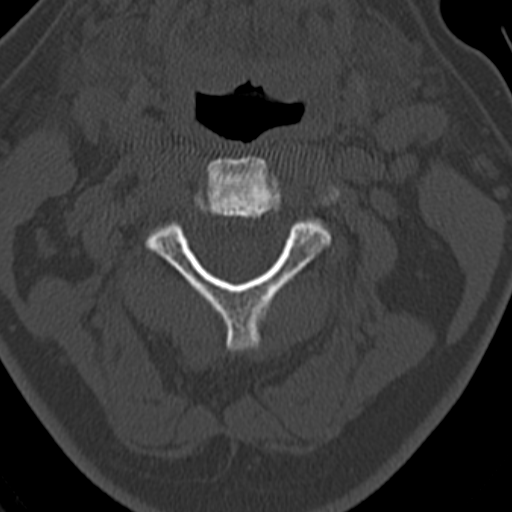
[im 57/74  bone]
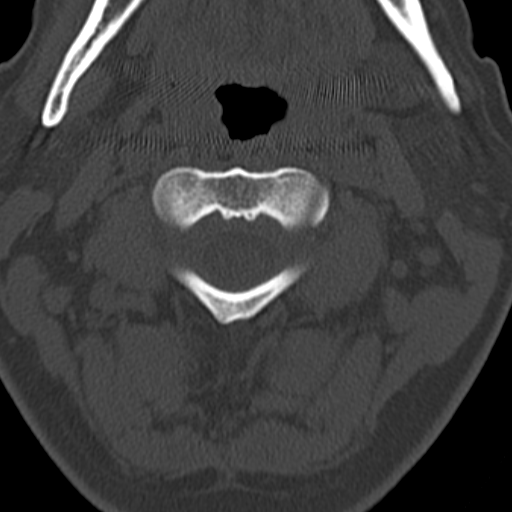
[im 62/74  bone]
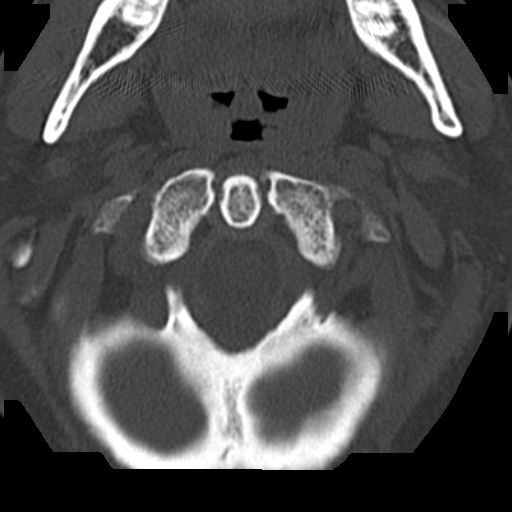
[im 68/74  bone]
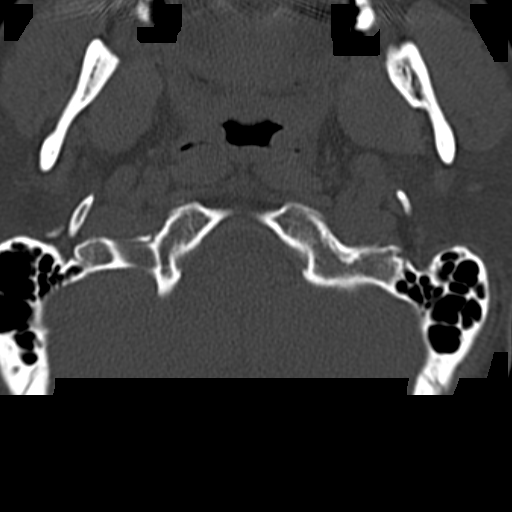

[12 of 14 positions shown; findings below may reference images not displayed]

IMPRESSION: No acute bony abnormality. Mild straightening of the
cervical spine noted is may be related torticollis or positioning.

## 2011-03-31 IMAGING — CT CT HEAD WITHOUT CONTRAST
2 series · 16 of 30 positions shown, 20 images · non-contrast
Comparison: none

REASON FOR EXAM: fall w/ vomiting and HA
COMMENTS:   May transport without cardiac monitor

PROCEDURE:     CT  - CT HEAD WITHOUT CONTRAST  - May 26, 2009  [DATE]
RESULT:     History: Fall, headache.
Comparison Study: No mass lesion. No hydrocephalus. No hemorrhage. No acute
bony abnormality.

[Series 2: without · axial · non-contrast · 0.39mm/px · z∈[+19,+139]mm · 13 of 29 slices shown, 17 images]
[im 3/29  brain]
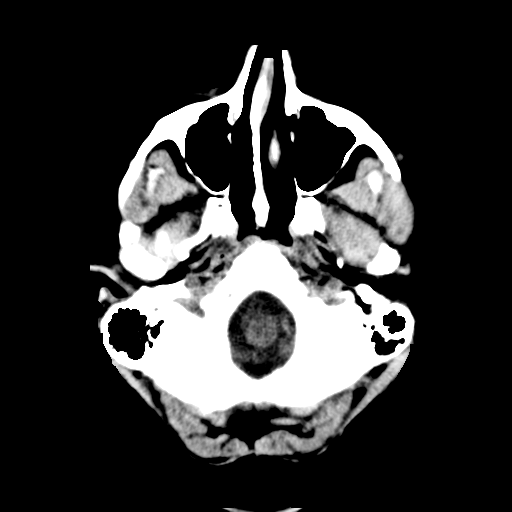
[im 3/29  bone]
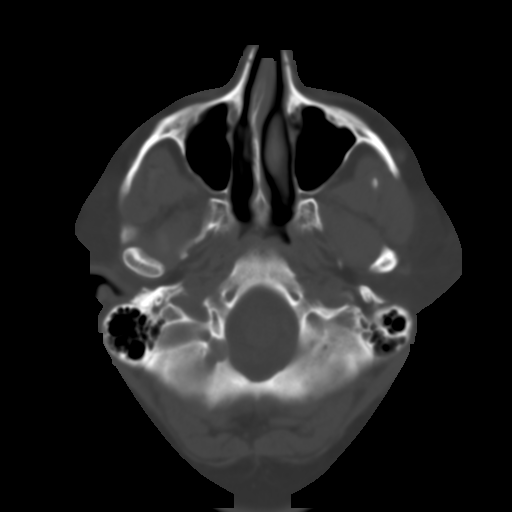
[im 5/29  brain]
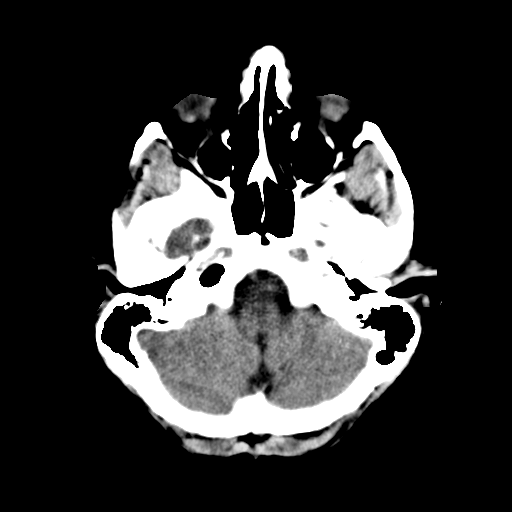
[im 7/29  brain]
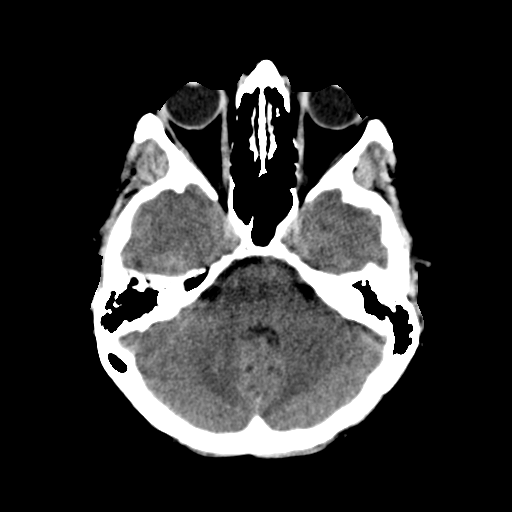
[im 9/29  brain]
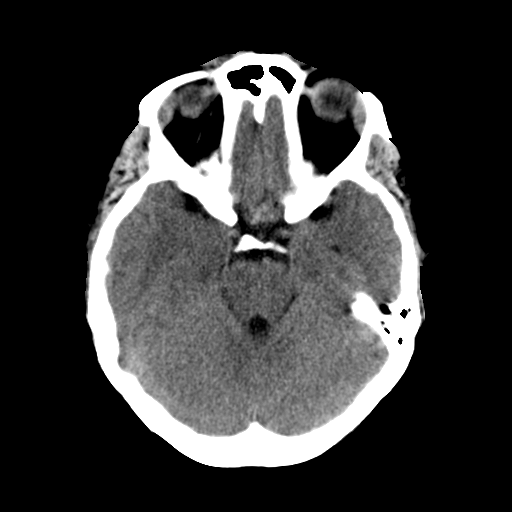
[im 11/29  brain]
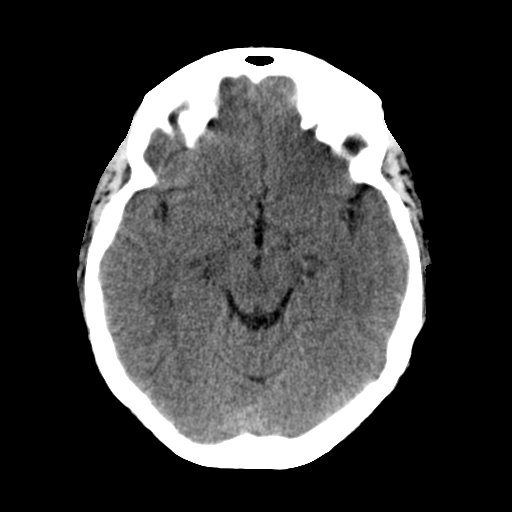
[im 11/29  bone]
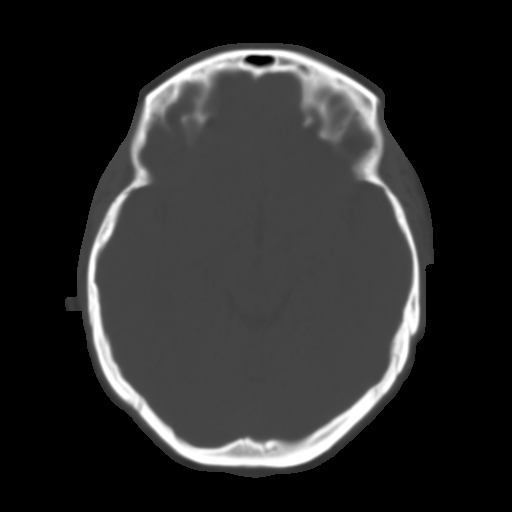
[im 13/29  brain]
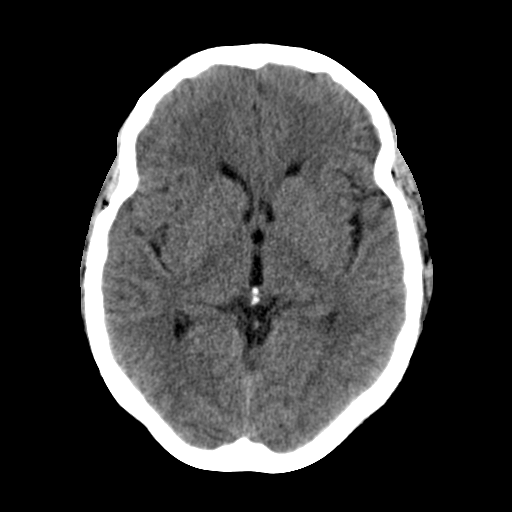
[im 15/29  brain]
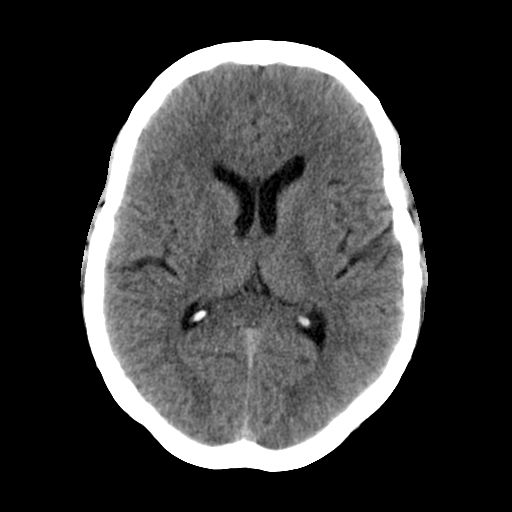
[im 17/29  brain]
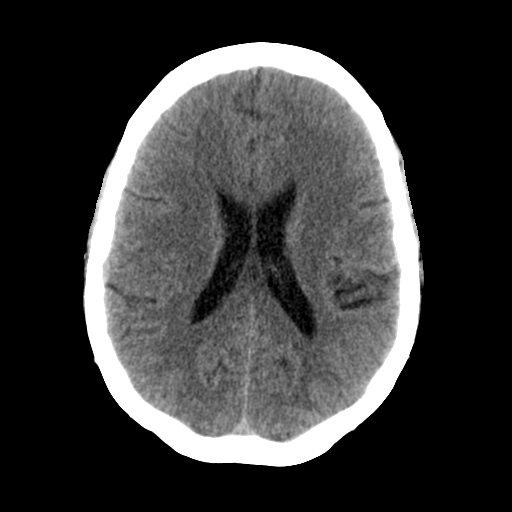
[im 19/29  brain]
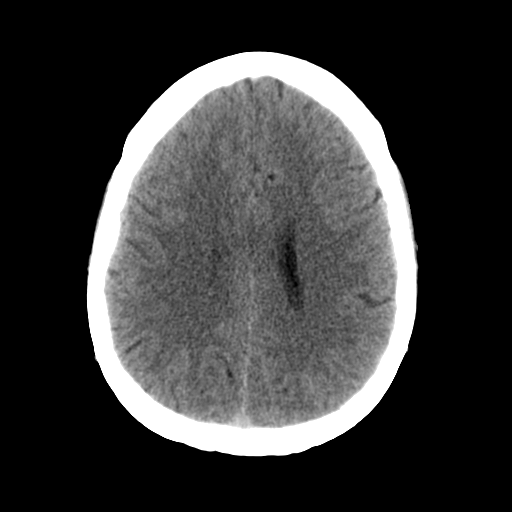
[im 19/29  bone]
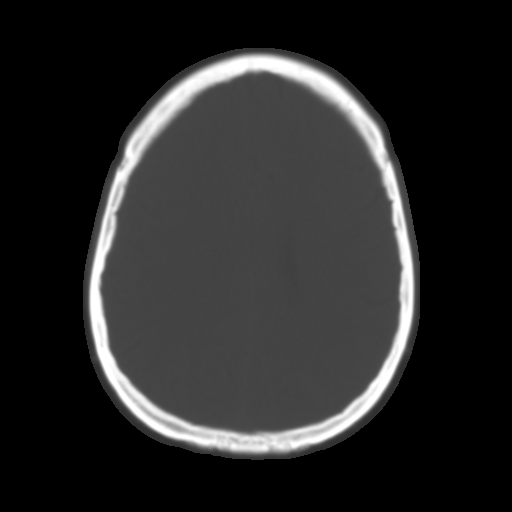
[im 21/29  brain]
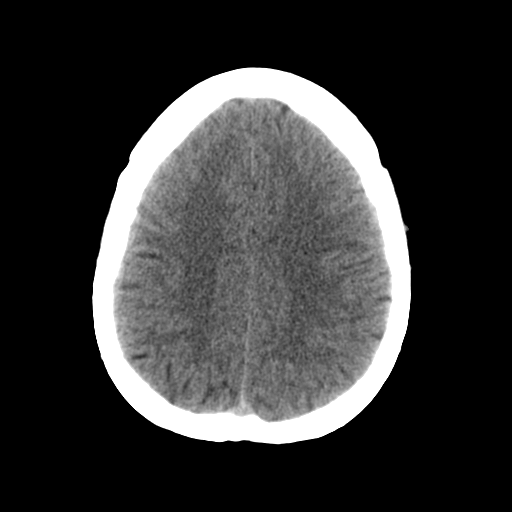
[im 23/29  brain]
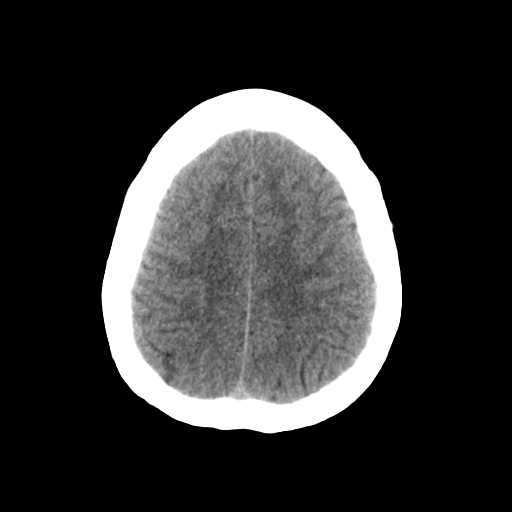
[im 25/29  brain]
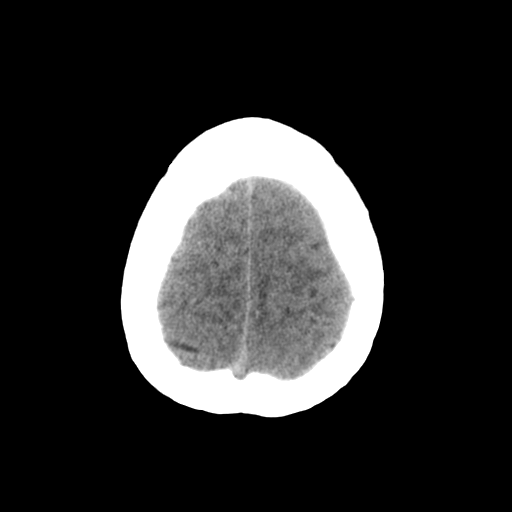
[im 27/29  brain]
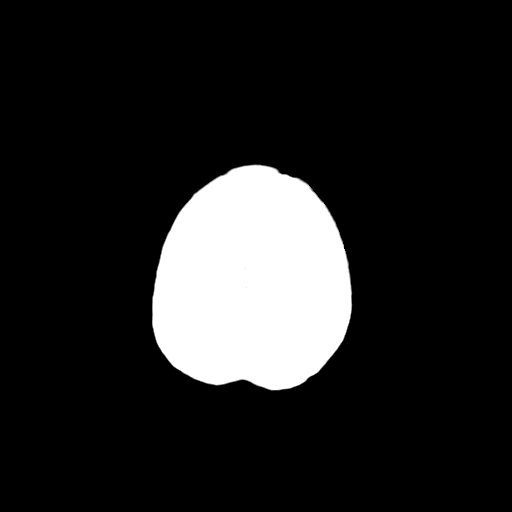
[im 27/29  bone]
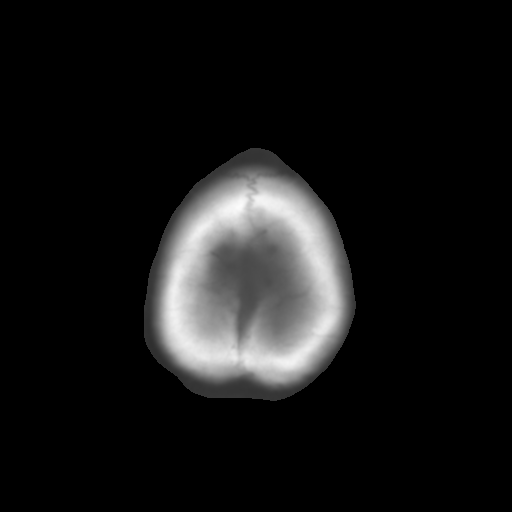

[Series 3: bone · axial · 0.39mm/px · z∈[+19,+59]mm · 3 of 29 slices shown]
[im 3/29  bone]
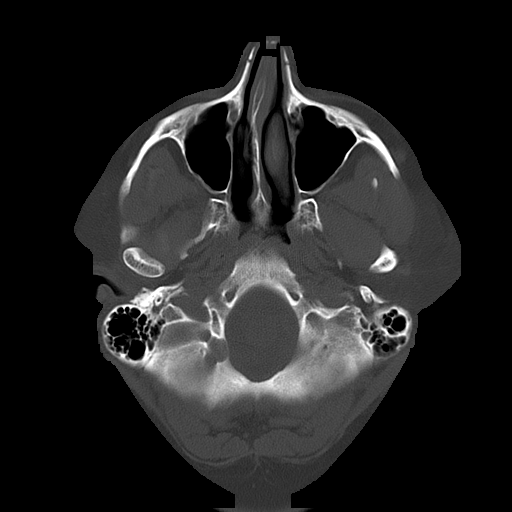
[im 7/29  bone]
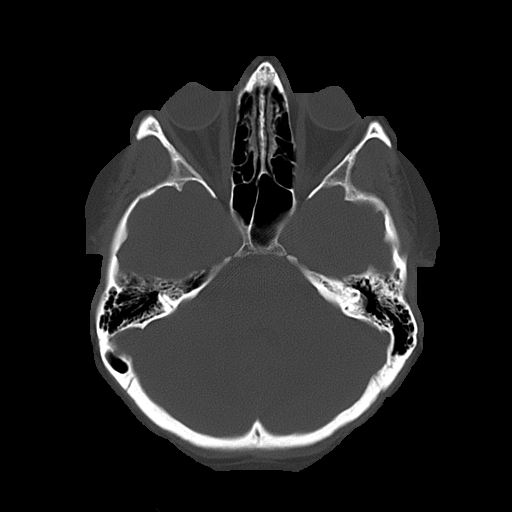
[im 11/29  bone]
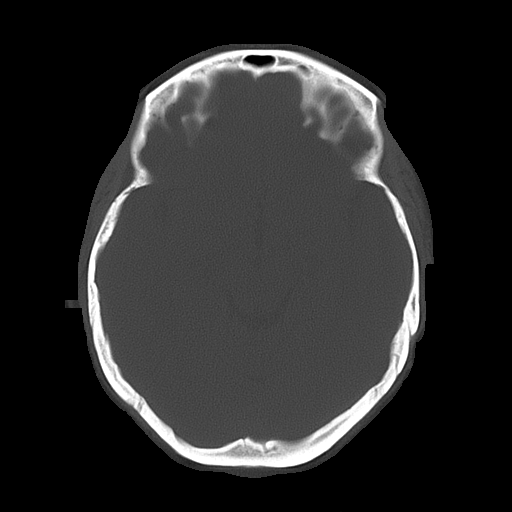

[16 of 30 positions shown; findings below may reference images not displayed]

IMPRESSION: No acute abnormality.

## 2011-03-31 IMAGING — CR PELVIS - 1-2 VIEW
1 series · 1 of 1 positions shown · non-contrast
Comparison: none

REASON FOR EXAM: fall w/ bilateral hip pain, mild
COMMENTS:

PROCEDURE:     DXR - DXR PELVIS AP ONLY  - May 26, 2009  [DATE]
RESULT:     No acute abnormality.

[view not recorded]
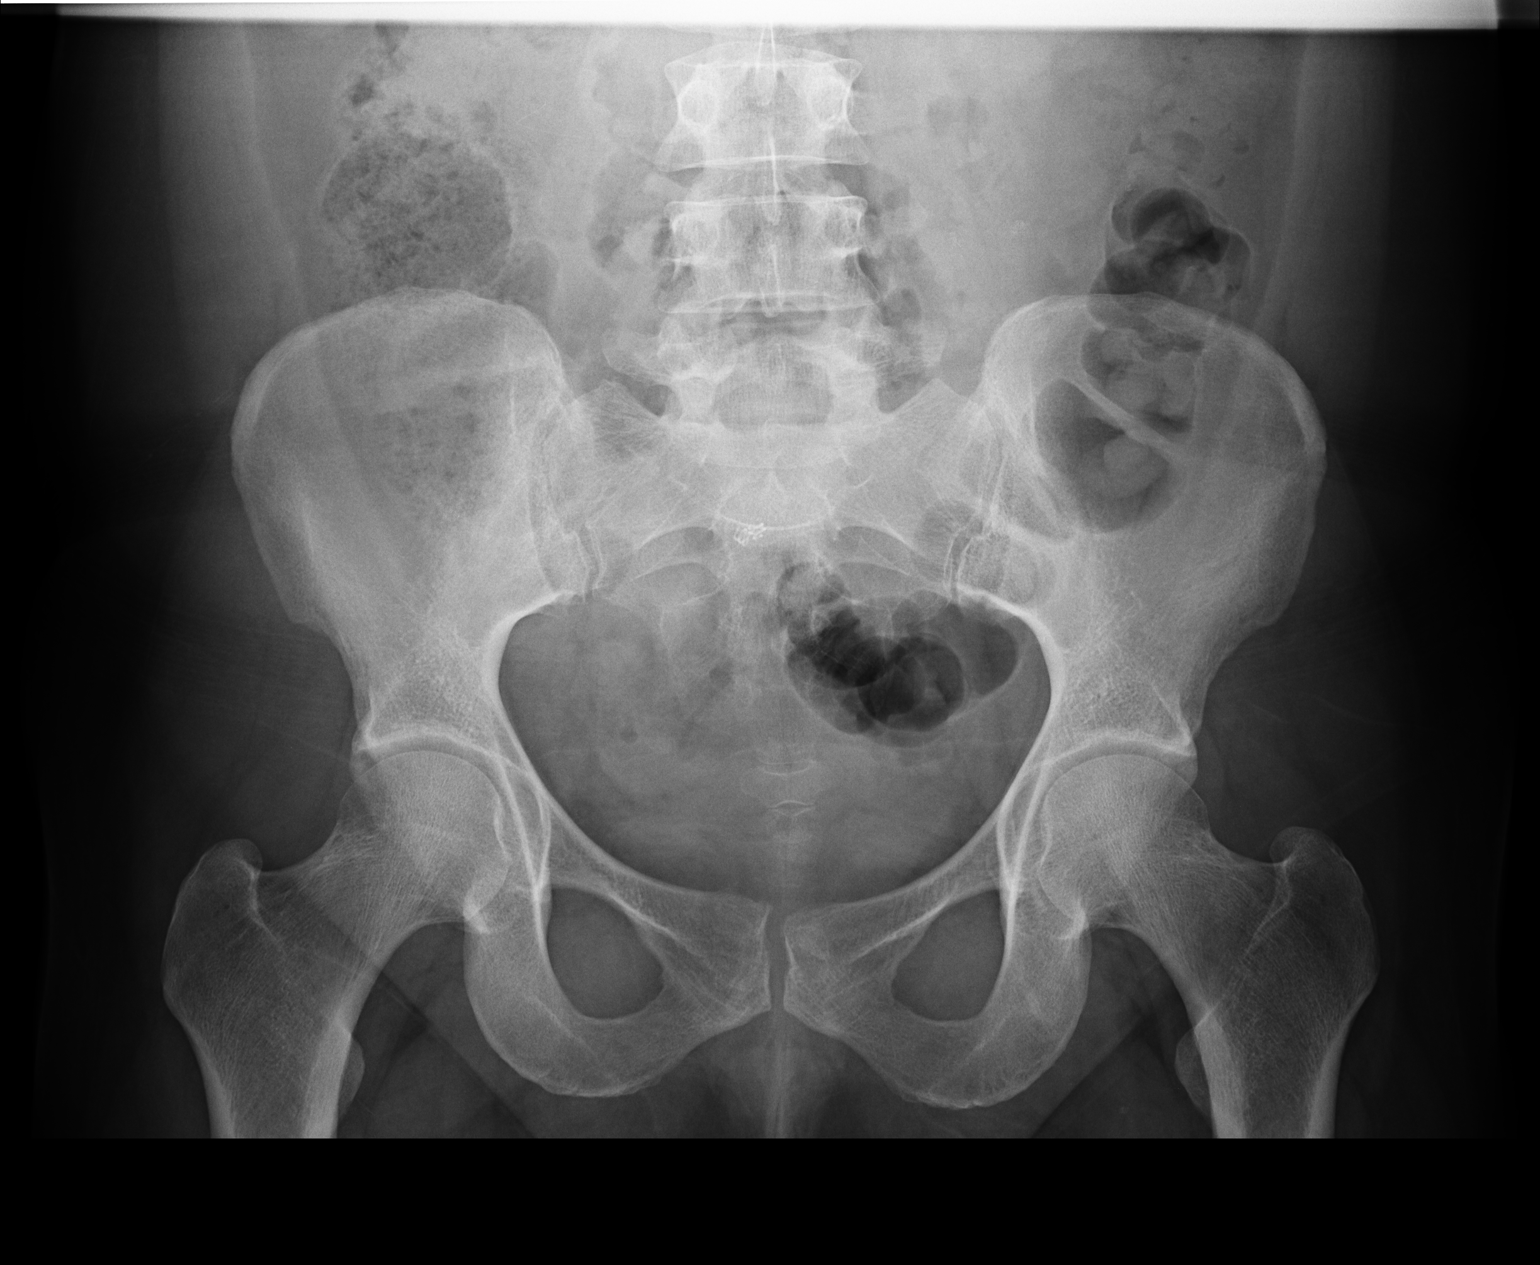

[1 of 1 positions shown; findings below may reference images not displayed]

IMPRESSION: No acute abnormality.

## 2011-05-27 ENCOUNTER — Emergency Department: Payer: Self-pay | Admitting: *Deleted

## 2011-05-27 LAB — PREGNANCY, URINE: Pregnancy Test, Urine: NEGATIVE m[IU]/mL

## 2011-06-30 IMAGING — CT CT CHEST-ABD-PELV W/O CM
1 of 2 series · 15 of 31 positions shown, 19 images · non-contrast
Comparison: none

REASON FOR EXAM: (1) hit byf  horse; (2) hit  by horse
COMMENTS:

PROCEDURE:     CT  - CT CHEST ABDOMEN AND PELVIS WO  - October 19, 2010  [DATE]
RESULT:     Comparison: 07/29/2008
TECHNIQUE: Multiple axial images obtained from the base of the skull to the
pubic symphysis, without p.o. contrast and without intravenous contrast.

[Series 2: soft tissue · axial · 0.64mm/px · z∈[-1315,-795]mm · 15 of 118 slices shown, 19 images]
[im 7/118  mediastinal]
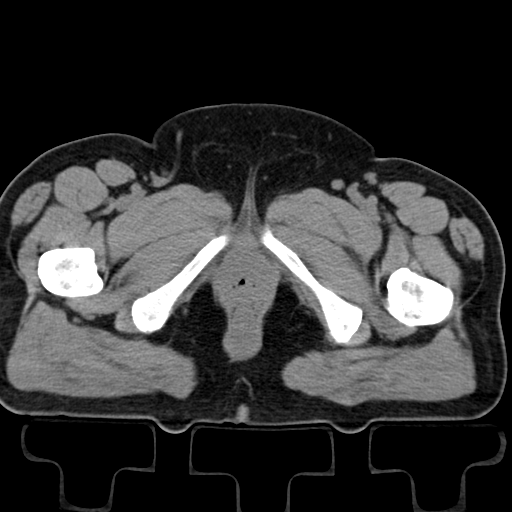
[im 7/118  bone]
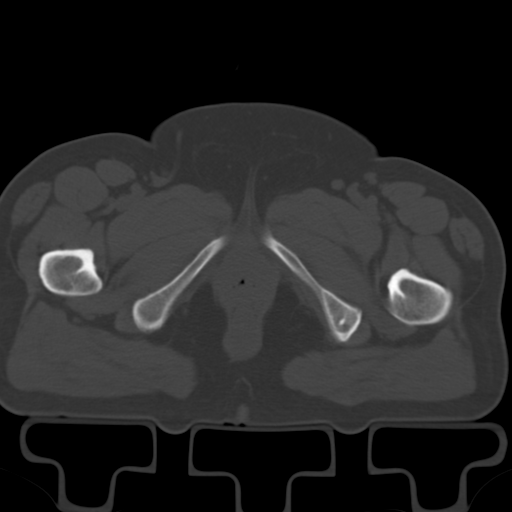
[im 19/118  mediastinal]
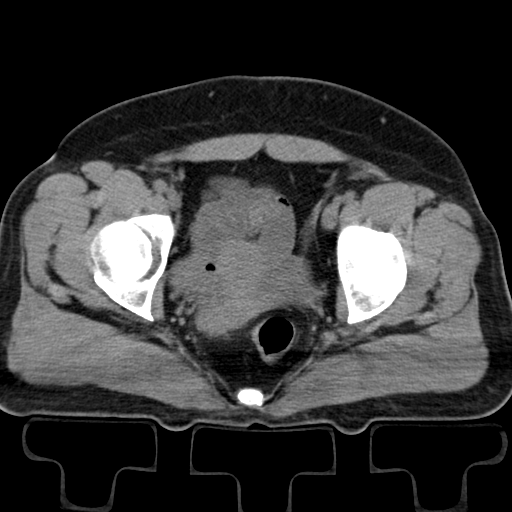
[im 31/118  mediastinal]
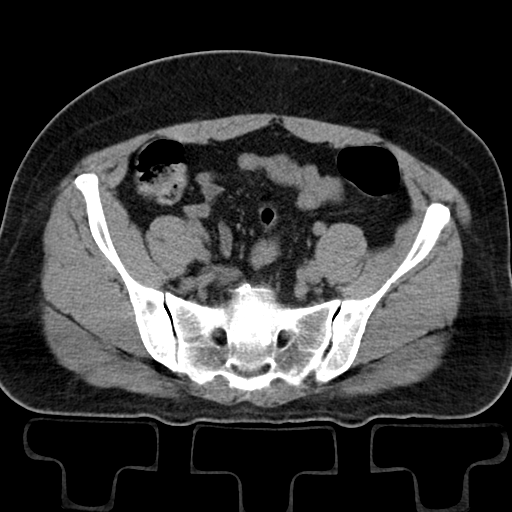
[im 37/118  mediastinal]
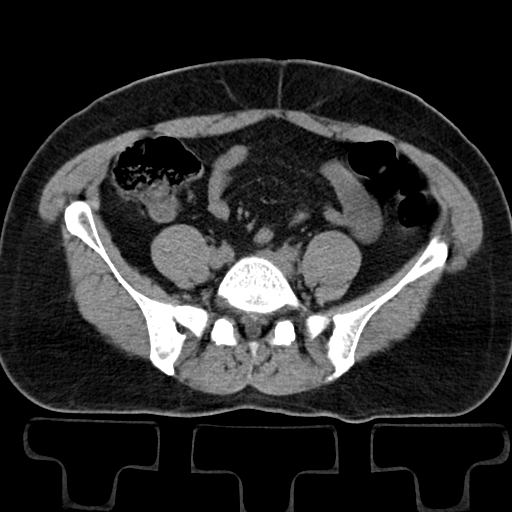
[im 44/118  mediastinal]
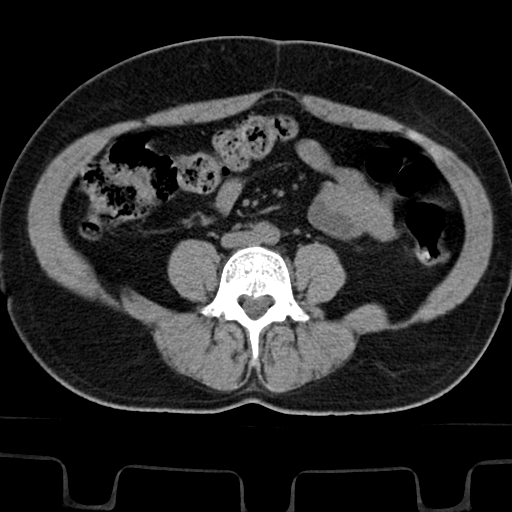
[im 50/118  mediastinal]
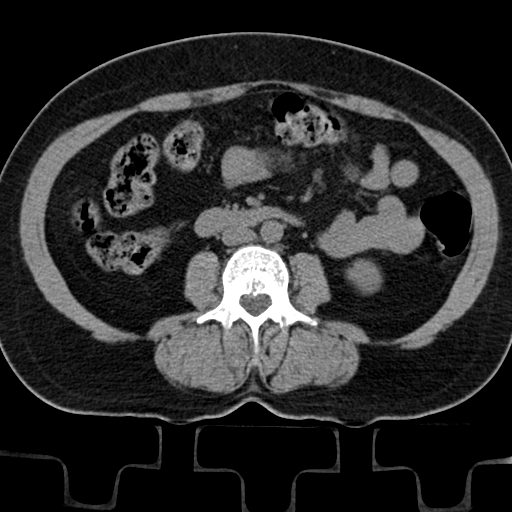
[im 58/118  mediastinal]
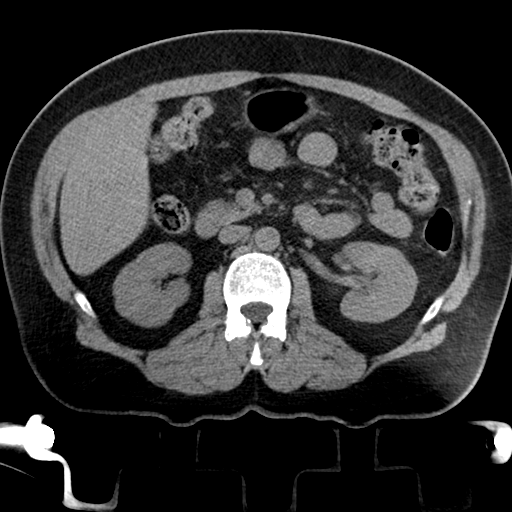
[im 68/118  mediastinal]
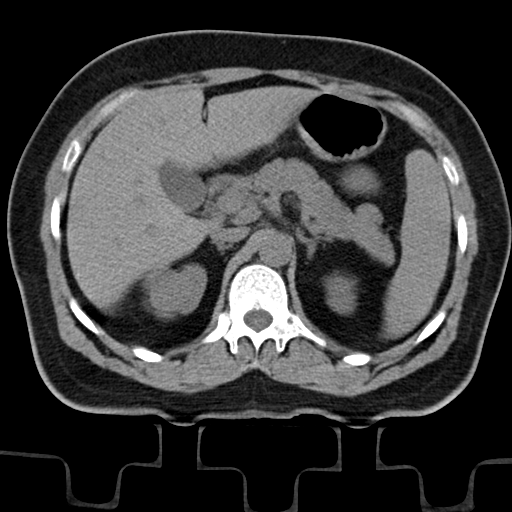
[im 74/118  mediastinal]
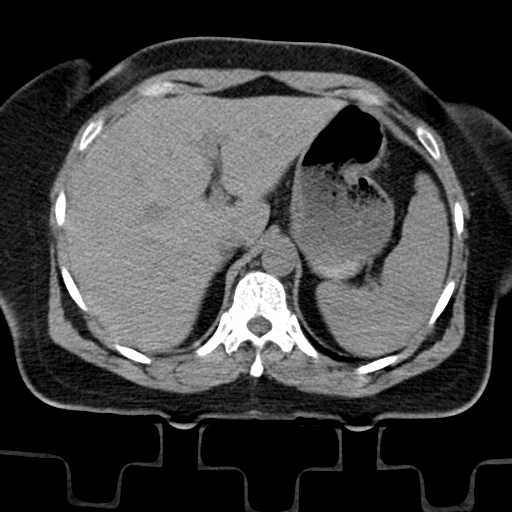
[im 74/118  bone]
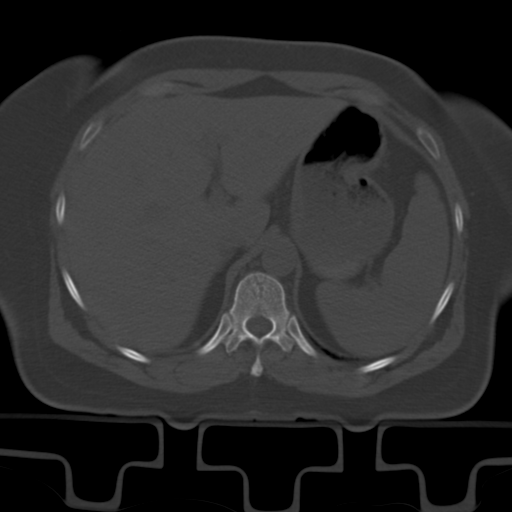
[im 81/118  mediastinal]
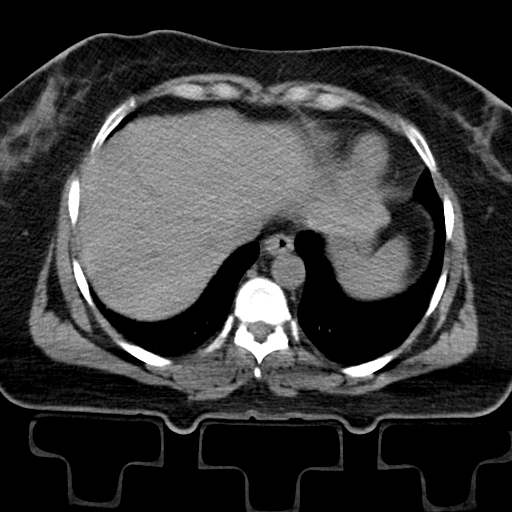
[im 87/118  mediastinal]
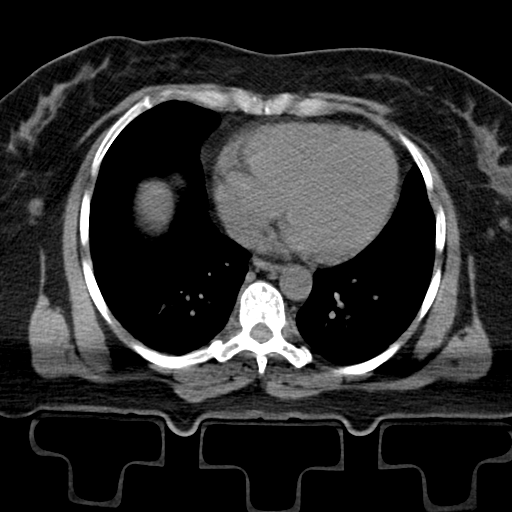
[im 93/118  lung]
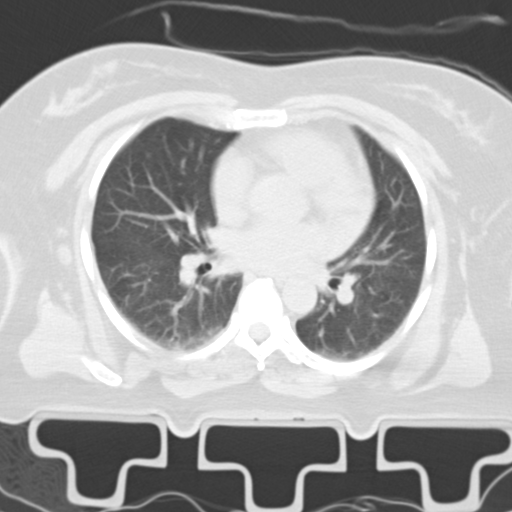
[im 99/118  mediastinal]
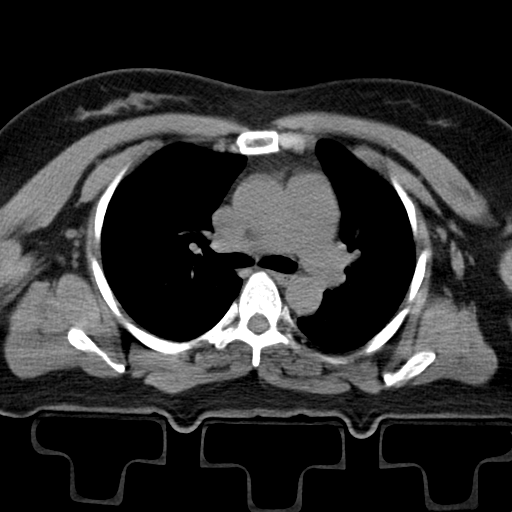
[im 99/118  lung]
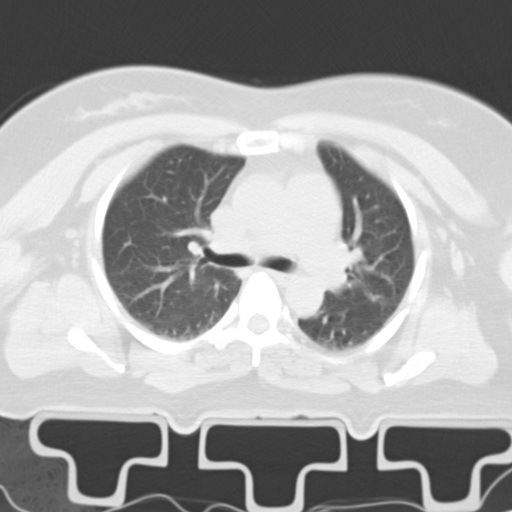
[im 105/118  lung]
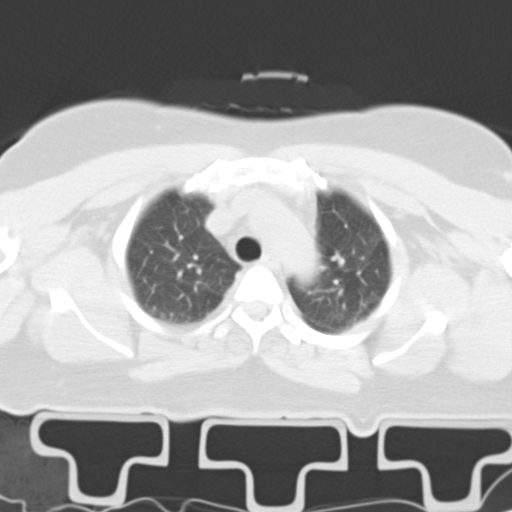
[im 111/118  mediastinal]
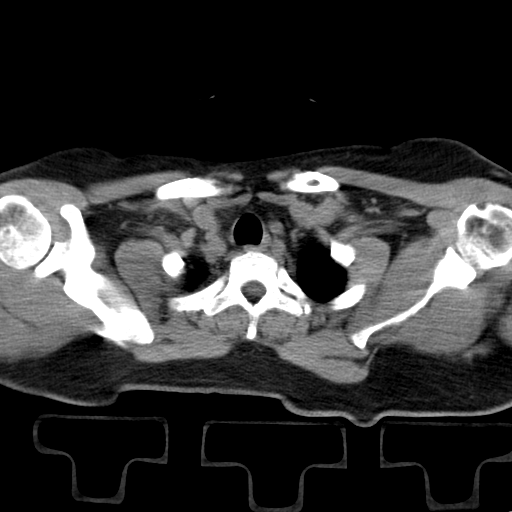
[im 111/118  lung]
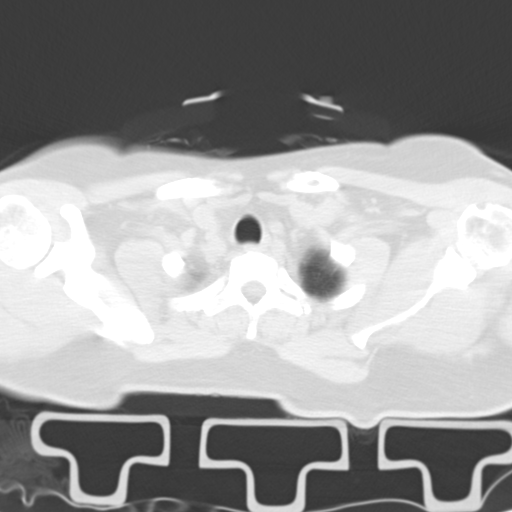

[15 of 31 positions shown; findings below may reference images not displayed]

FINDINGS: Evaluation of the thoracic vessels is limited by lack of intravenous
contrast. No periaortic stranding. No evidence of mediastinal hematoma. Mild
pulmonary ground glass opacities likely represent atelectasis. There is a
small calcified nodule in the right lower lobe.

Evaluation of the solid abdominal organs is limited secondary to lack of
intravenous contrast. There is a subtle area of low-attenuation in the
medial left hepatic lobe which is likely related to a branch of the portal
vein. However, given the lack of intravenous contrast, a hepatic injury
cannot be excluded. The spleen, adrenals, and pancreas are unremarkable.
There is no hydronephrosis. No perihepatic or perisplenic fluid. The small
and large bowel are normal in caliber. No free fluid in the pelvis. The
appendix is normal.

The osseous structures are unremarkable.
IMPRESSION: 1. Evaluation for traumatic injury is limited by lack of intravenous
contrast.
2. Subtle area of low-attenuation in the left hepatic lobe is likely related
to a branch of the portal vein. However, given the lack of intravenous
contrast, a subtle hepatic injury/contusion would be difficult to exclude.
If there is continued clinical concern, contrast enhanced CT could be
performed.
3. Otherwise, no acute findings on this noncontrast study.

## 2011-06-30 IMAGING — CT CT CERVICAL SPINE WITHOUT CONTRAST
1 series · 12 of 14 positions shown, 15 images · non-contrast
Comparison: none

REASON FOR EXAM: hit by horse
COMMENTS:

PROCEDURE:     CT  - CT CERVICAL SPINE WO  - October 19, 2010  [DATE]
RESULT:     Comparison: 06/25/2010
TECHNIQUE: Multiple axial CT images were obtained of the cervical spine,
without intravenous contrast.  Sagittal and coronal reformatted images were
constructed.

[Series 4: axial · axial · 0.33mm/px · z∈[-830,-677]mm · 12 of 91 slices shown, 15 images]
[im 7/91  soft-tissue]
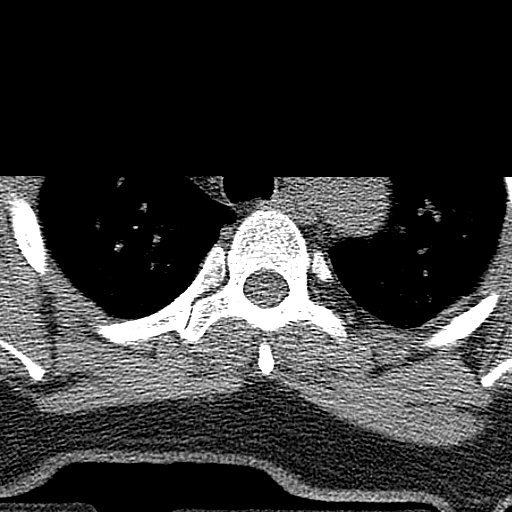
[im 7/91  bone]
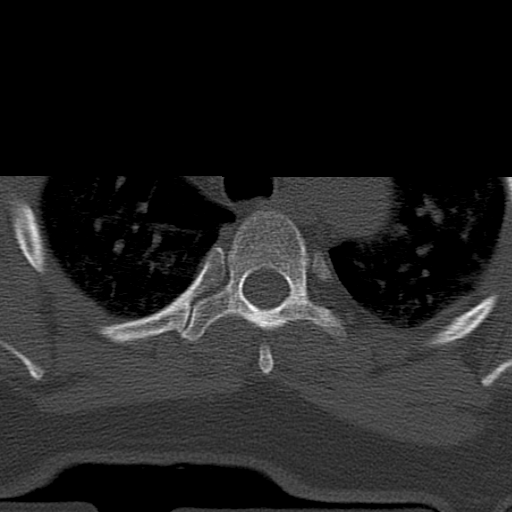
[im 14/91  bone]
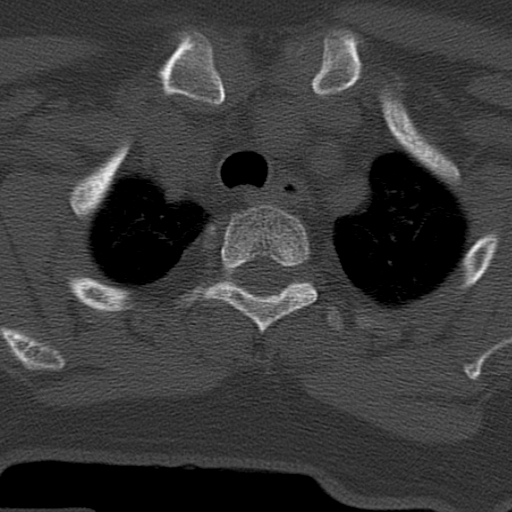
[im 21/91  bone]
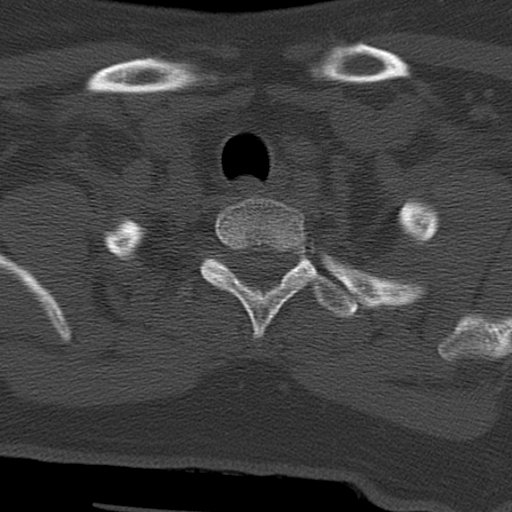
[im 28/91  bone]
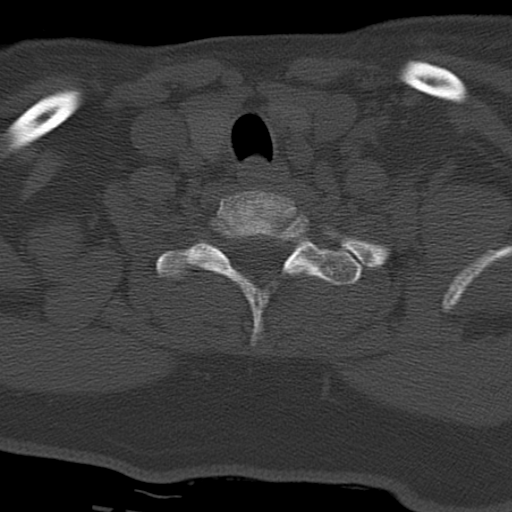
[im 35/91  soft-tissue]
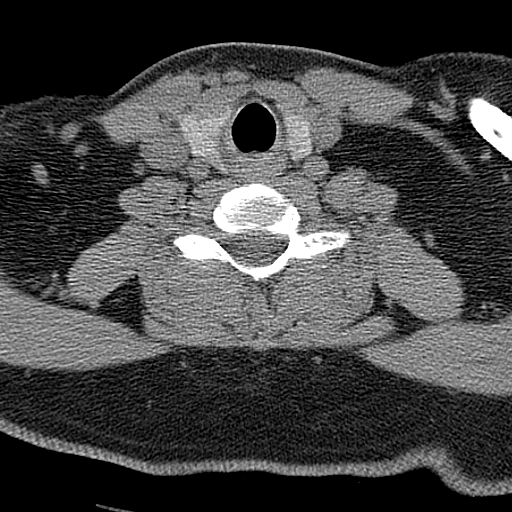
[im 35/91  bone]
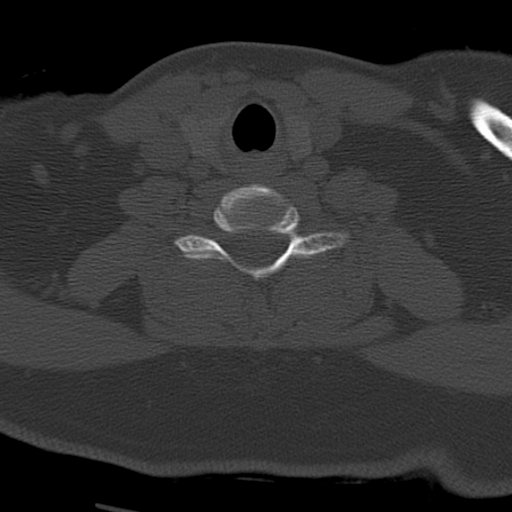
[im 42/91  bone]
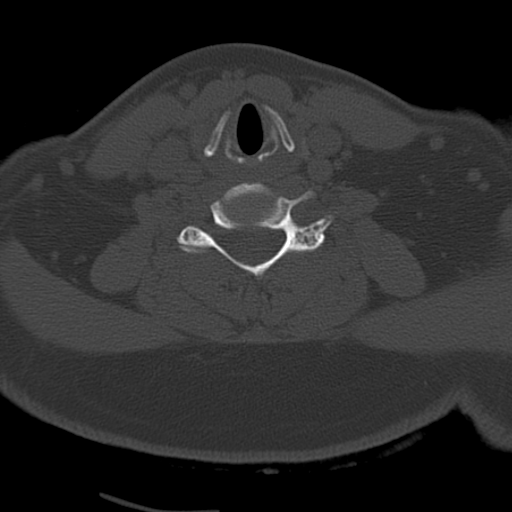
[im 49/91  bone]
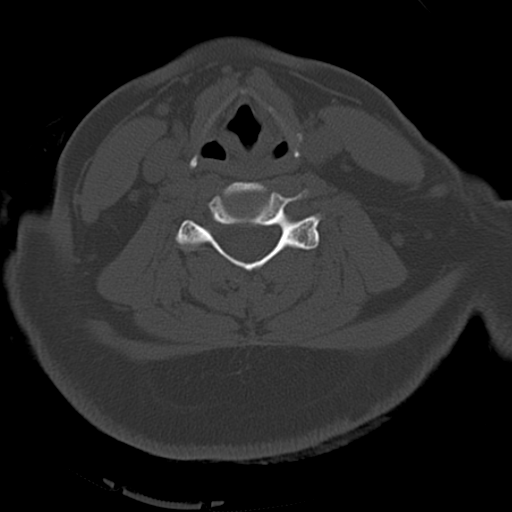
[im 56/91  bone]
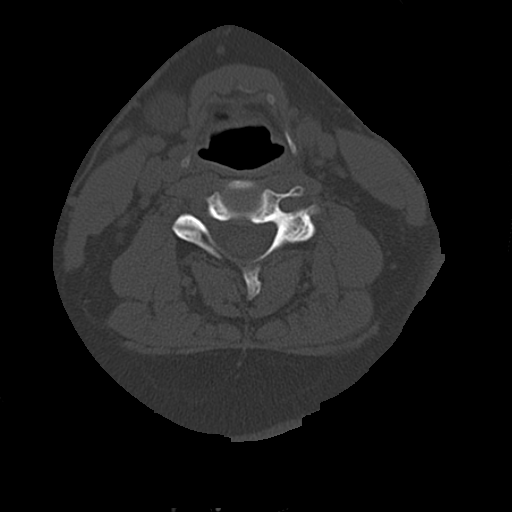
[im 63/91  soft-tissue]
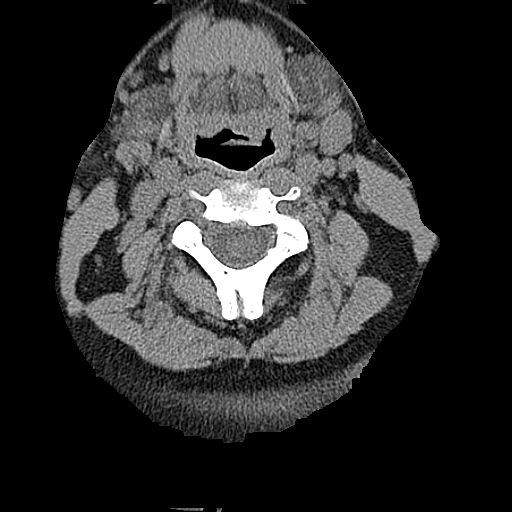
[im 63/91  bone]
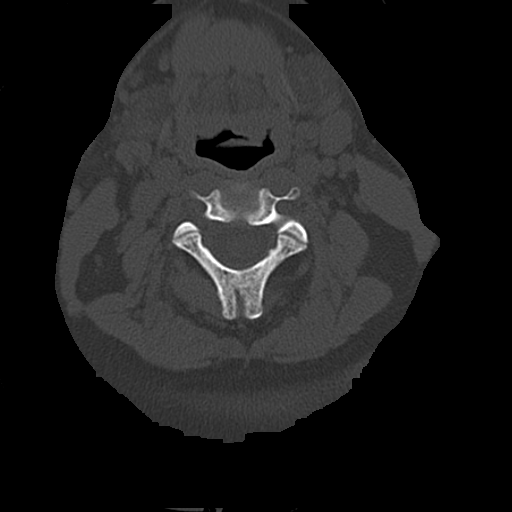
[im 70/91  bone]
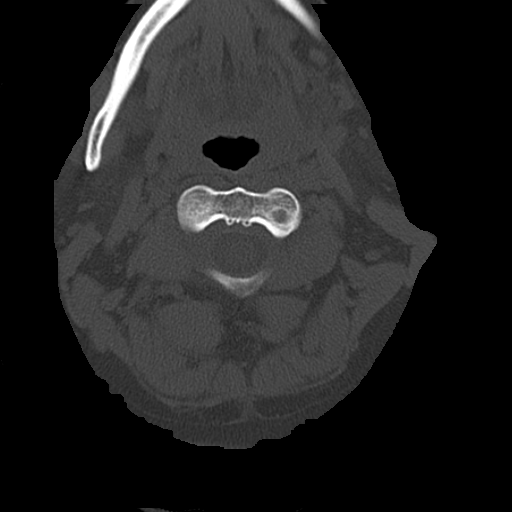
[im 77/91  bone]
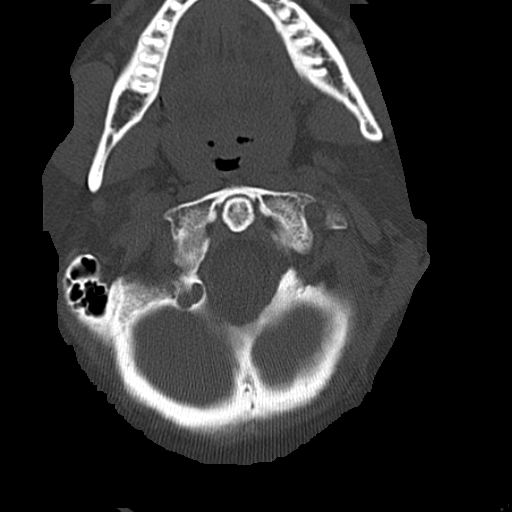
[im 84/91  bone]
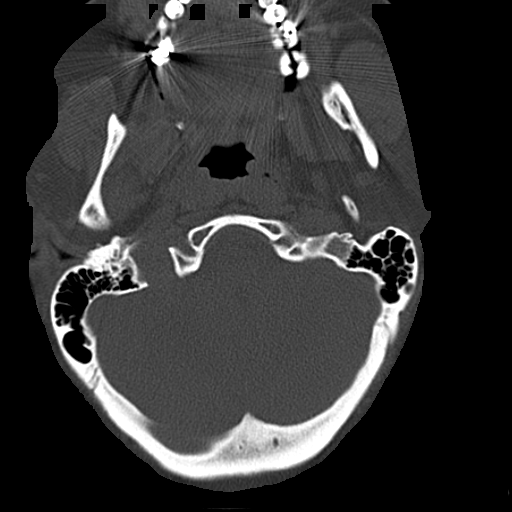

[12 of 14 positions shown; findings below may reference images not displayed]

FINDINGS: No evidence of cervical spine fracture or static listhesis.  Vertebral body
heights are maintained.  Prevertebral soft tissues are without normal limits.
IMPRESSION: No cervical spine fracture or static listhesis.  Ligamentous injury cannot
be excluded.

## 2011-07-17 ENCOUNTER — Observation Stay: Payer: Self-pay | Admitting: *Deleted

## 2011-07-17 LAB — SEDIMENTATION RATE: Erythrocyte Sed Rate: 12 mm/hr (ref 0–20)

## 2011-07-17 LAB — PROTIME-INR
INR: 0.9
Prothrombin Time: 12.4 secs (ref 11.5–14.7)

## 2011-07-17 LAB — LIPASE, BLOOD: Lipase: 152 U/L (ref 73–393)

## 2011-07-17 LAB — CBC
HCT: 38.9 % (ref 35.0–47.0)
HGB: 13.1 g/dL (ref 12.0–16.0)
RBC: 4.48 10*6/uL (ref 3.80–5.20)

## 2011-07-17 LAB — COMPREHENSIVE METABOLIC PANEL
Albumin: 3.8 g/dL (ref 3.4–5.0)
Anion Gap: 9 (ref 7–16)
BUN: 11 mg/dL (ref 7–18)
Calcium, Total: 8.6 mg/dL (ref 8.5–10.1)
Chloride: 110 mmol/L — ABNORMAL HIGH (ref 98–107)
Creatinine: 0.78 mg/dL (ref 0.60–1.30)
EGFR (African American): >60
Osmolality: 286 (ref 275–301)
SGOT(AST): 42 U/L — ABNORMAL HIGH (ref 15–37)
SGPT (ALT): 56 U/L
Sodium: 143 mmol/L (ref 136–145)
Total Protein: 7.5 g/dL (ref 6.4–8.2)

## 2011-07-17 LAB — TROPONIN I: Troponin-I: <0.02 ng/mL

## 2011-07-18 LAB — BASIC METABOLIC PANEL
Anion Gap: 8 (ref 7–16)
BUN: 10 mg/dL (ref 7–18)
Calcium, Total: 8.3 mg/dL — ABNORMAL LOW (ref 8.5–10.1)
Chloride: 109 mmol/L — ABNORMAL HIGH (ref 98–107)
Co2: 25 mmol/L (ref 21–32)
Creatinine: 0.88 mg/dL (ref 0.60–1.30)
EGFR (African American): >60
Osmolality: 283 (ref 275–301)

## 2011-07-18 LAB — CBC WITH DIFFERENTIAL/PLATELET
Basophil #: 0 10*3/uL (ref 0.0–0.1)
Eosinophil #: 0.1 10*3/uL (ref 0.0–0.7)
Lymphocyte %: 46.8 %
MCH: 29.3 pg (ref 26.0–34.0)
MCV: 88 fL (ref 80–100)
Neutrophil %: 44 %
Platelet: 174 10*3/uL (ref 150–440)
RBC: 4.17 10*6/uL (ref 3.80–5.20)
RDW: 14.1 % (ref 11.5–14.5)
WBC: 4.6 10*3/uL (ref 3.6–11.0)

## 2011-07-18 LAB — HEMOGLOBIN: HGB: 12.7 g/dL (ref 12.0–16.0)

## 2011-08-08 ENCOUNTER — Ambulatory Visit: Payer: Self-pay | Admitting: Family Medicine

## 2011-08-08 LAB — PREGNANCY, URINE: Pregnancy Test, Urine: NEGATIVE m[IU]/mL

## 2011-08-11 ENCOUNTER — Emergency Department: Payer: Self-pay | Admitting: Emergency Medicine

## 2011-08-11 LAB — COMPREHENSIVE METABOLIC PANEL
Albumin: 3.9 g/dL (ref 3.4–5.0)
Alkaline Phosphatase: 89 U/L (ref 50–136)
Anion Gap: 10 (ref 7–16)
BUN: 14 mg/dL (ref 7–18)
Bilirubin,Total: 0.6 mg/dL (ref 0.2–1.0)
Calcium, Total: 8.2 mg/dL — ABNORMAL LOW (ref 8.5–10.1)
Chloride: 111 mmol/L — ABNORMAL HIGH (ref 98–107)
Creatinine: 0.8 mg/dL (ref 0.60–1.30)
EGFR (African American): >60
EGFR (Non-African Amer.): >60
Glucose: 102 mg/dL — ABNORMAL HIGH (ref 65–99)
Osmolality: 291 (ref 275–301)
Sodium: 146 mmol/L — ABNORMAL HIGH (ref 136–145)
Total Protein: 7.2 g/dL (ref 6.4–8.2)

## 2011-08-11 LAB — CBC
HCT: 35.5 % (ref 35.0–47.0)
HGB: 12.2 g/dL (ref 12.0–16.0)
MCH: 29.5 pg (ref 26.0–34.0)
MCHC: 34.3 g/dL (ref 32.0–36.0)
Platelet: 169 10*3/uL (ref 150–440)
RBC: 4.12 10*6/uL (ref 3.80–5.20)
RDW: 14.1 % (ref 11.5–14.5)
WBC: 5.7 10*3/uL (ref 3.6–11.0)

## 2011-09-05 ENCOUNTER — Ambulatory Visit: Payer: Self-pay | Admitting: Family Medicine

## 2011-09-05 ENCOUNTER — Emergency Department: Payer: Self-pay | Admitting: Emergency Medicine

## 2011-09-05 LAB — URINALYSIS, COMPLETE
Bacteria: NONE SEEN
Ketone: NEGATIVE
Leukocyte Esterase: NEGATIVE
Nitrite: NEGATIVE
Ph: 6 (ref 4.5–8.0)
Protein: NEGATIVE
RBC,UR: <1 /HPF (ref 0–5)
Specific Gravity: 1.013 (ref 1.003–1.030)
Squamous Epithelial: 1

## 2011-09-05 LAB — COMPREHENSIVE METABOLIC PANEL
Albumin: 3.9 g/dL (ref 3.4–5.0)
Anion Gap: 8 (ref 7–16)
BUN: 10 mg/dL (ref 7–18)
Bilirubin,Total: 0.5 mg/dL (ref 0.2–1.0)
Calcium, Total: 8.8 mg/dL (ref 8.5–10.1)
Chloride: 108 mmol/L — ABNORMAL HIGH (ref 98–107)
Co2: 26 mmol/L (ref 21–32)
Creatinine: 0.73 mg/dL (ref 0.60–1.30)
EGFR (African American): >60
Glucose: 87 mg/dL (ref 65–99)
Osmolality: 282 (ref 275–301)
Potassium: 3.4 mmol/L — ABNORMAL LOW (ref 3.5–5.1)
SGPT (ALT): 56 U/L
Sodium: 142 mmol/L (ref 136–145)
Total Protein: 7.5 g/dL (ref 6.4–8.2)

## 2011-09-05 LAB — CBC
HGB: 12.9 g/dL (ref 12.0–16.0)
MCHC: 34.4 g/dL (ref 32.0–36.0)
MCV: 87 fL (ref 80–100)
Platelet: 160 10*3/uL (ref 150–440)
RDW: 14.2 % (ref 11.5–14.5)

## 2011-09-05 LAB — DRUG SCREEN, URINE
Barbiturates, Ur Screen: NEGATIVE (ref ?–200)
Cocaine Metabolite,Ur ~~LOC~~: NEGATIVE (ref ?–300)
Methadone, Ur Screen: NEGATIVE (ref ?–300)
Opiate, Ur Screen: POSITIVE (ref ?–300)
Tricyclic, Ur Screen: NEGATIVE (ref ?–1000)

## 2011-09-05 LAB — HCG, QUANTITATIVE, PREGNANCY: Beta Hcg, Quant.: <1 m[IU]/mL — ABNORMAL LOW

## 2011-09-05 LAB — TROPONIN I: Troponin-I: <0.02 ng/mL

## 2011-11-02 ENCOUNTER — Emergency Department: Payer: Self-pay | Admitting: Emergency Medicine

## 2011-11-03 LAB — CBC
HGB: 13.1 g/dL (ref 12.0–16.0)
MCH: 29.4 pg (ref 26.0–34.0)
MCV: 86 fL (ref 80–100)
Platelet: 159 10*3/uL (ref 150–440)
RBC: 4.45 10*6/uL (ref 3.80–5.20)
RDW: 14.7 % — ABNORMAL HIGH (ref 11.5–14.5)
WBC: 5.1 10*3/uL (ref 3.6–11.0)

## 2011-11-03 LAB — COMPREHENSIVE METABOLIC PANEL
Albumin: 3.6 g/dL (ref 3.4–5.0)
Alkaline Phosphatase: 106 U/L (ref 50–136)
BUN: 11 mg/dL (ref 7–18)
Chloride: 107 mmol/L (ref 98–107)
Co2: 27 mmol/L (ref 21–32)
Creatinine: 0.85 mg/dL (ref 0.60–1.30)
Osmolality: 287 (ref 275–301)
Potassium: 3.3 mmol/L — ABNORMAL LOW (ref 3.5–5.1)
Total Protein: 7.4 g/dL (ref 6.4–8.2)

## 2011-11-05 ENCOUNTER — Observation Stay: Payer: Self-pay | Admitting: Internal Medicine

## 2011-11-05 LAB — CBC WITH DIFFERENTIAL/PLATELET
Basophil #: 0 10*3/uL (ref 0.0–0.1)
HCT: 38.8 % (ref 35.0–47.0)
HGB: 13.2 g/dL (ref 12.0–16.0)
Lymphocyte %: 35.4 %
MCH: 29.4 pg (ref 26.0–34.0)
MCHC: 34 g/dL (ref 32.0–36.0)
MCV: 87 fL (ref 80–100)
Monocyte #: 0.3 x10 3/mm (ref 0.2–0.9)
Monocyte %: 6.5 %
Neutrophil #: 2.6 10*3/uL (ref 1.4–6.5)
RBC: 4.48 10*6/uL (ref 3.80–5.20)
RDW: 14.3 % (ref 11.5–14.5)
WBC: 4.6 10*3/uL (ref 3.6–11.0)

## 2011-11-05 LAB — COMPREHENSIVE METABOLIC PANEL
Albumin: 3.7 g/dL (ref 3.4–5.0)
Bilirubin,Total: 0.4 mg/dL (ref 0.2–1.0)
Co2: 23 mmol/L (ref 21–32)
Creatinine: 0.74 mg/dL (ref 0.60–1.30)
EGFR (Non-African Amer.): >60
Glucose: 158 mg/dL — ABNORMAL HIGH (ref 65–99)
Osmolality: 284 (ref 275–301)
Potassium: 3.7 mmol/L (ref 3.5–5.1)
SGOT(AST): 69 U/L — ABNORMAL HIGH (ref 15–37)
SGPT (ALT): 64 U/L
Sodium: 141 mmol/L (ref 136–145)
Total Protein: 7.6 g/dL (ref 6.4–8.2)

## 2011-11-05 LAB — PROTIME-INR
INR: 0.9
Prothrombin Time: 12.5 secs (ref 11.5–14.7)

## 2011-11-05 LAB — HEMOGLOBIN: HGB: 12.6 g/dL (ref 12.0–16.0)

## 2011-11-05 LAB — URINALYSIS, COMPLETE
Glucose,UR: NEGATIVE mg/dL (ref 0–75)
Hyaline Cast: 2
Squamous Epithelial: 11

## 2011-11-05 LAB — LIPASE, BLOOD: Lipase: 156 U/L (ref 73–393)

## 2011-11-05 LAB — PREGNANCY, URINE: Pregnancy Test, Urine: NEGATIVE m[IU]/mL

## 2011-11-05 LAB — TROPONIN I: Troponin-I: <0.02 ng/mL

## 2011-11-06 LAB — BASIC METABOLIC PANEL
Anion Gap: 8 (ref 7–16)
Calcium, Total: 8.3 mg/dL — ABNORMAL LOW (ref 8.5–10.1)
Co2: 26 mmol/L (ref 21–32)
Creatinine: 1.08 mg/dL (ref 0.60–1.30)
EGFR (African American): >60
Glucose: 106 mg/dL — ABNORMAL HIGH (ref 65–99)
Osmolality: 283 (ref 275–301)
Sodium: 141 mmol/L (ref 136–145)

## 2011-11-06 LAB — CBC WITH DIFFERENTIAL/PLATELET
Eosinophil #: 0.1 10*3/uL (ref 0.0–0.7)
HCT: 36.1 % (ref 35.0–47.0)
Lymphocyte #: 1.7 10*3/uL (ref 1.0–3.6)
Lymphocyte %: 37.8 %
MCH: 29.5 pg (ref 26.0–34.0)
MCHC: 34 g/dL (ref 32.0–36.0)
MCV: 87 fL (ref 80–100)
Monocyte %: 7.6 %
Neutrophil #: 2.4 10*3/uL (ref 1.4–6.5)
Platelet: 159 10*3/uL (ref 150–440)
RBC: 4.15 10*6/uL (ref 3.80–5.20)
RDW: 14.4 % (ref 11.5–14.5)

## 2011-11-07 LAB — URINE CULTURE

## 2011-11-09 LAB — BASIC METABOLIC PANEL
Anion Gap: 6 — ABNORMAL LOW (ref 7–16)
BUN: 13 mg/dL (ref 7–18)
Chloride: 107 mmol/L (ref 98–107)
Co2: 27 mmol/L (ref 21–32)
EGFR (African American): >60
EGFR (Non-African Amer.): 58 — ABNORMAL LOW
Glucose: 104 mg/dL — ABNORMAL HIGH (ref 65–99)
Sodium: 140 mmol/L (ref 136–145)

## 2012-02-05 IMAGING — CT CT HEAD WITHOUT CONTRAST
2 series · 16 of 30 positions shown, 20 images · non-contrast
Comparison: none

REASON FOR EXAM: fall; pt in [place]
COMMENTS:

PROCEDURE:     CT  - CT HEAD WITHOUT CONTRAST  - May 27, 2011  [DATE]
RESULT:     Comparison:  10/19/2010
TECHNIQUE: Multiple axial images from the foramen magnum to the vertex were
obtained without IV contrast.

[Series 2: without · axial · non-contrast · 0.42mm/px · z∈[-28,+92]mm · 13 of 30 slices shown, 17 images]
[im 3/30  brain]
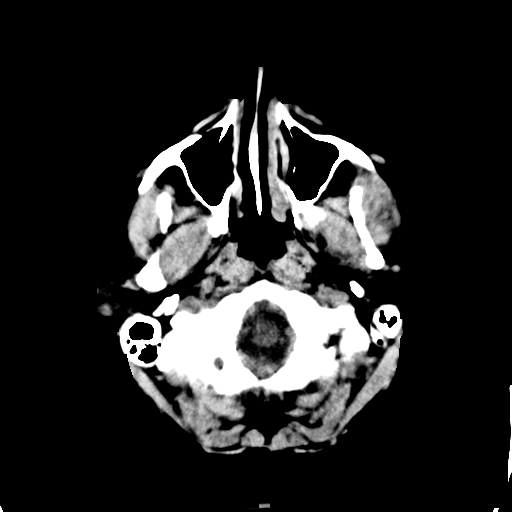
[im 3/30  bone]
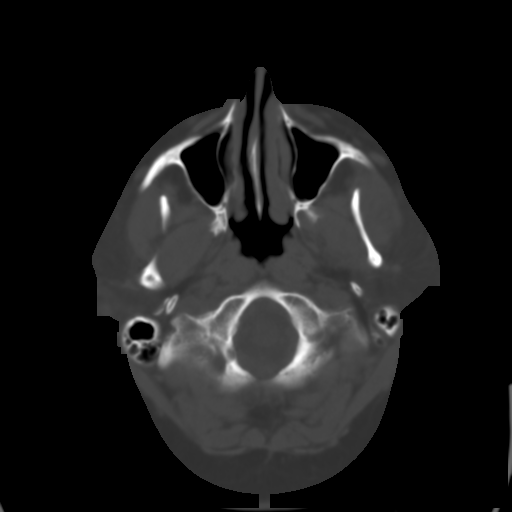
[im 5/30  brain]
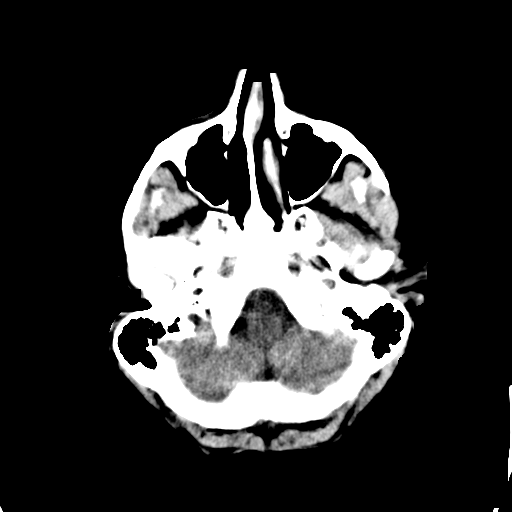
[im 7/30  brain]
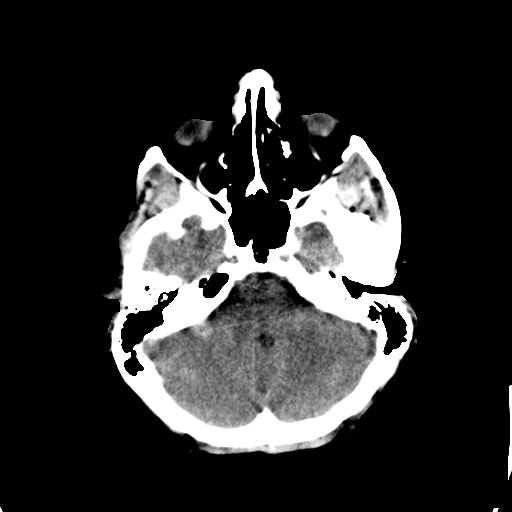
[im 9/30  brain]
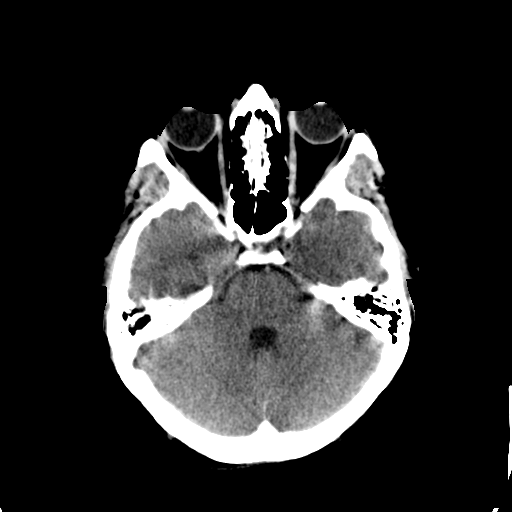
[im 11/30  brain]
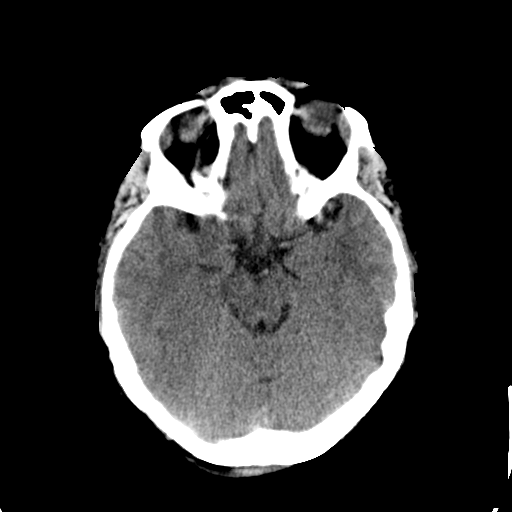
[im 11/30  bone]
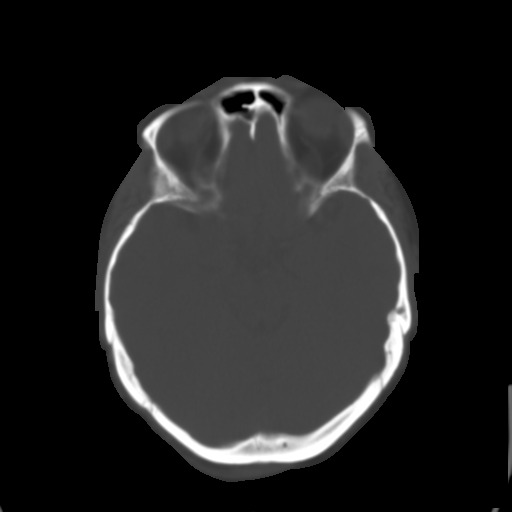
[im 13/30  brain]
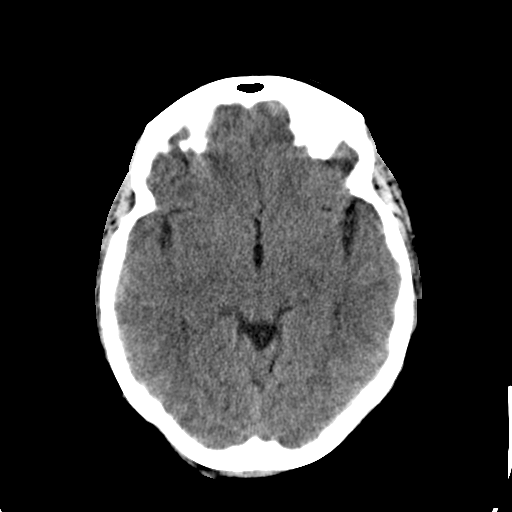
[im 15/30  brain]
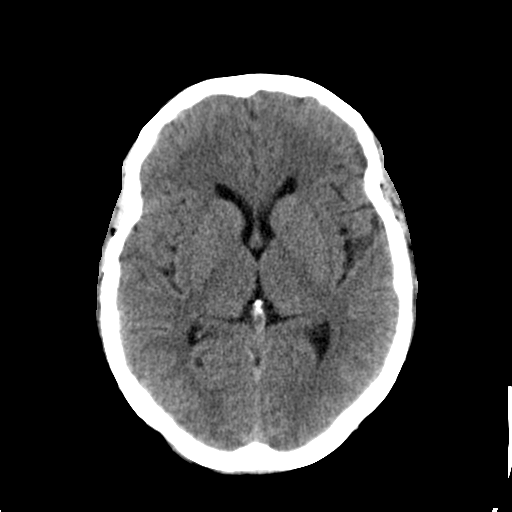
[im 17/30  brain]
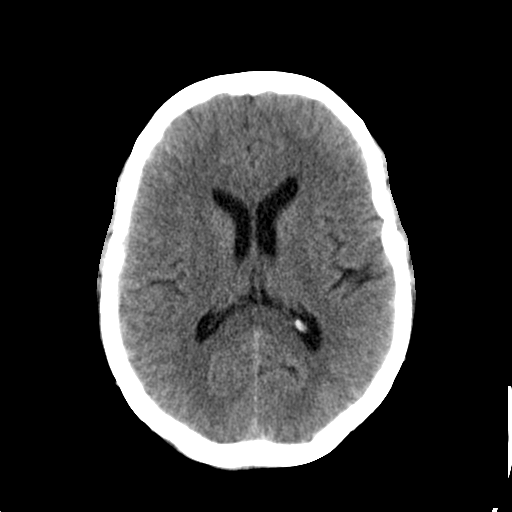
[im 19/30  brain]
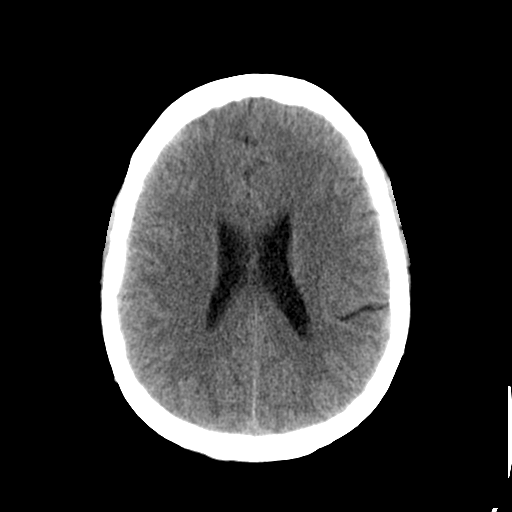
[im 19/30  bone]
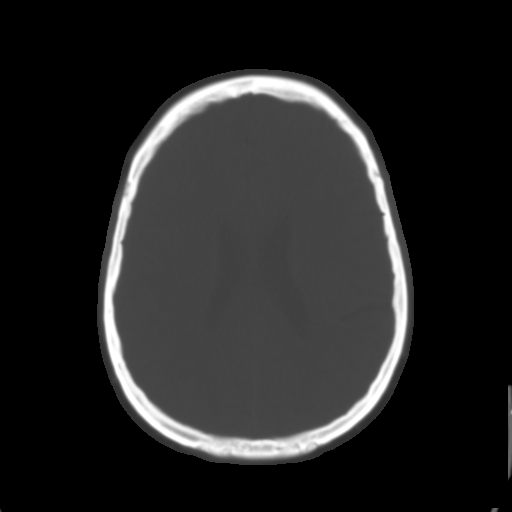
[im 21/30  brain]
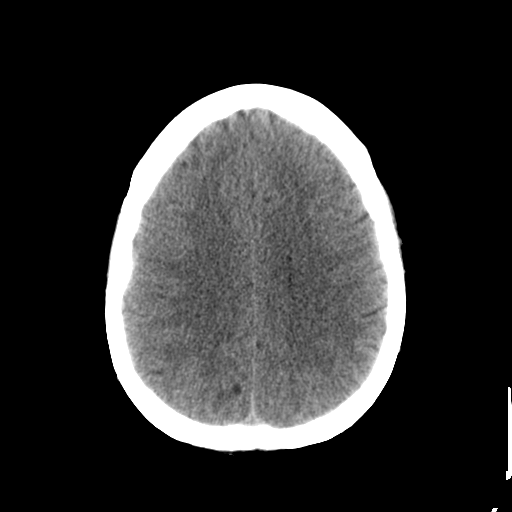
[im 23/30  brain]
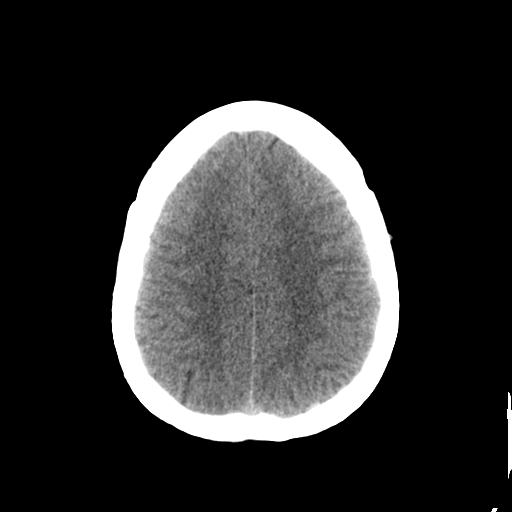
[im 25/30  brain]
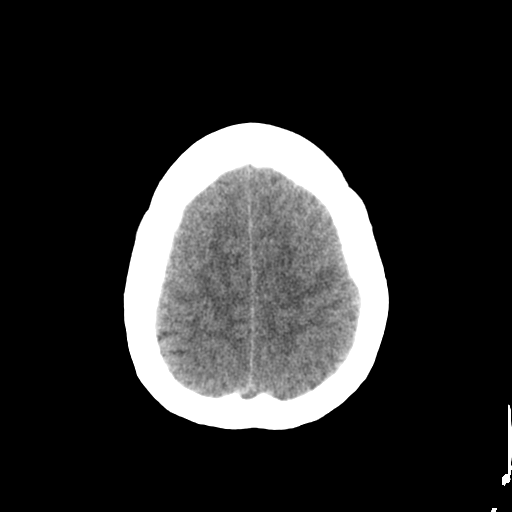
[im 27/30  brain]
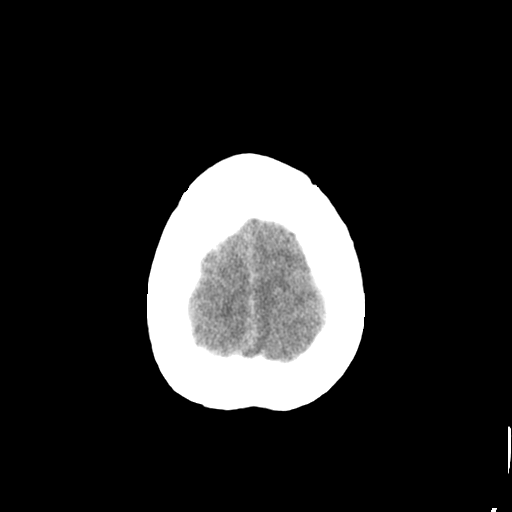
[im 27/30  bone]
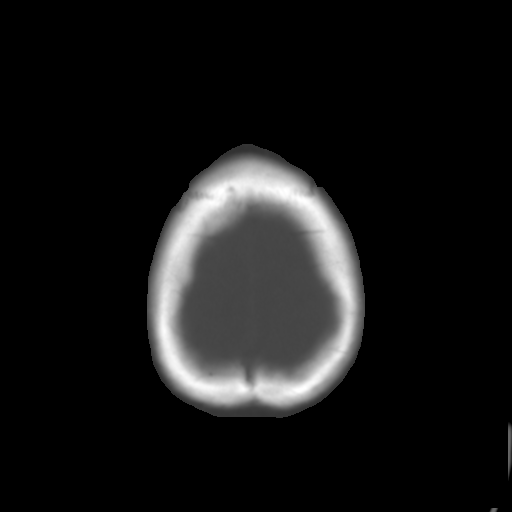

[Series 3: bone · axial · 0.42mm/px · z∈[-28,+12]mm · 3 of 30 slices shown]
[im 3/30  bone]
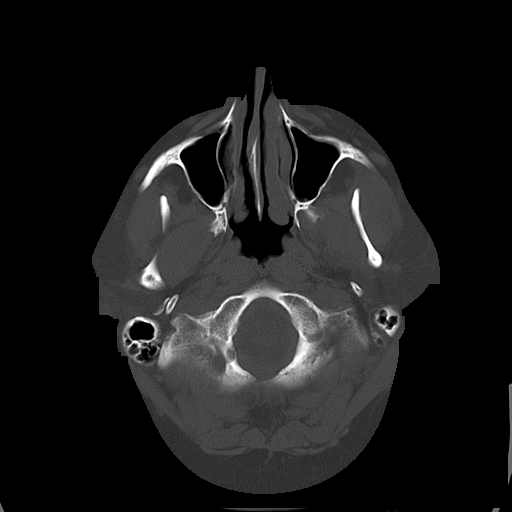
[im 7/30  bone]
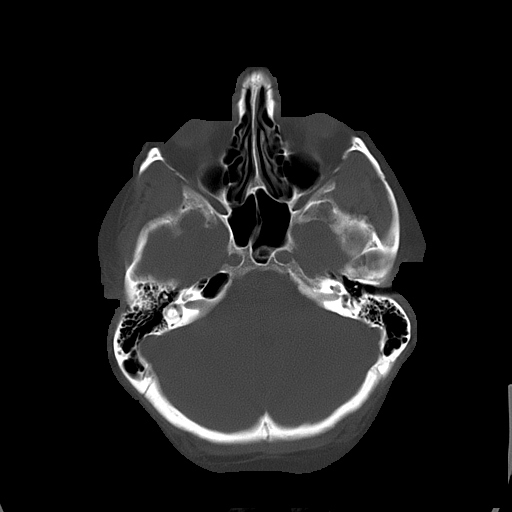
[im 11/30  bone]
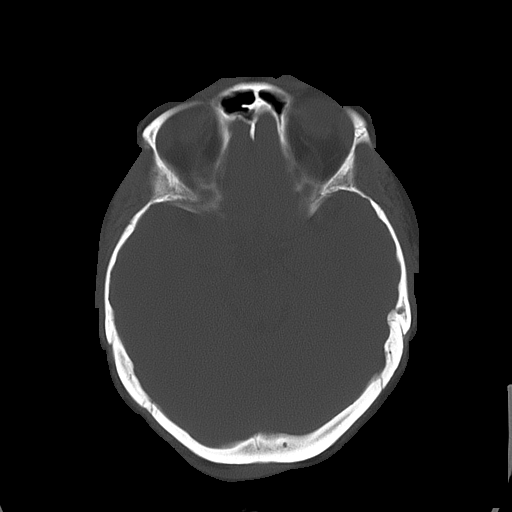

[16 of 30 positions shown; findings below may reference images not displayed]

FINDINGS: There is no evidence for mass effect, midline shift, or extra-axial fluid
collections. There is no evidence for space-occupying lesion, intracranial
hemorrhage, or cortical-based area of infarction.

There is a small hematoma along the occipital scalp.

The osseous structures are unremarkable.
IMPRESSION: No acute intracranial process.

## 2012-02-13 ENCOUNTER — Ambulatory Visit: Payer: Self-pay | Admitting: Family Medicine

## 2012-03-28 IMAGING — NM NUCLEAR MEDICINE GASTROINTESTINAL BLEEDING STUDY
3 series · 9 of 9 positions shown · non-contrast
Comparison: none

REASON FOR EXAM: lower GI bleed
COMMENTS:

[Series 1000: gi bleed · 4.80mm/px · 6 of 250 frames shown]
[frame 21/250]
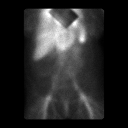
[frame 63/250]
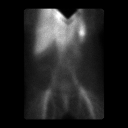
[frame 105/250]
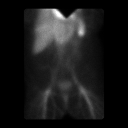
[frame 146/250]
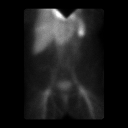
[frame 188/250]
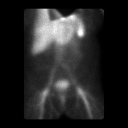
[frame 230/250]
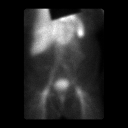

[Series 1000: gi bleed static save_screens · 1 of 1 slices shown]
[im 1/1]
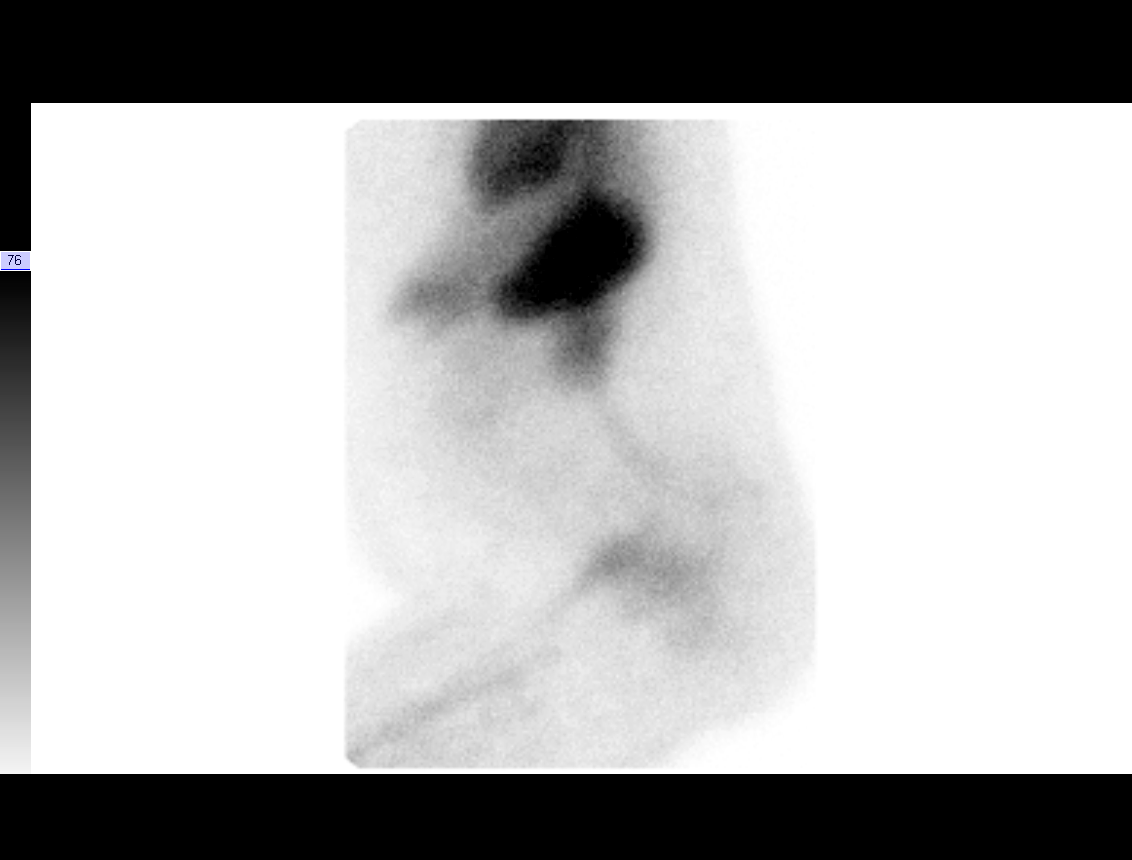

[Series 1000: gi bleed static · 2.40mm/px · 2 of 2 frames shown]
[frame 1/2]
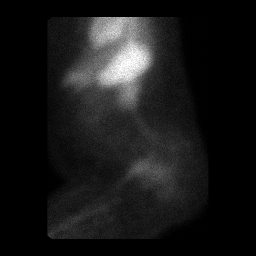
[frame 2/2]
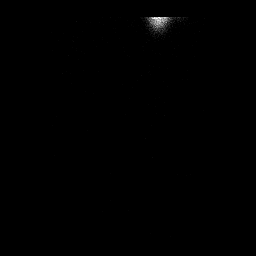

[9 of 9 positions shown; findings below may reference images not displayed]

PROCEDURE:     NM  - NM GI BLOOD LOSS STUDY  - July 18, 2011  [DATE]

RESULT:     The patient received a dose of 3.0 mL of PYP and 22.4 mCi of
technetium 99 M pertechnetate. Anterior acquisition is obtained and reviewed
in the dynamic fashion. The images demonstrate normal tracer distribution
without evidence of active gastrointestinal hemorrhage.
IMPRESSION: 1. No evidence of active gastrointestinal hemorrhage.

## 2012-03-29 IMAGING — CT CT ABD-PELV W/O CM
1 of 2 series · 15 of 32 positions shown, 19 images · non-contrast
Comparison: none

REASON FOR EXAM: (1) rectal bleed; (2) rectal bleed;    NOTE: Nursing to
Give Oral CT Contrast
COMMENTS:

[Series 2: 3mm soft tissue · axial · 0.74mm/px · z∈[-728,-288]mm · 15 of 161 slices shown, 19 images]
[im 7/161  soft-tissue]
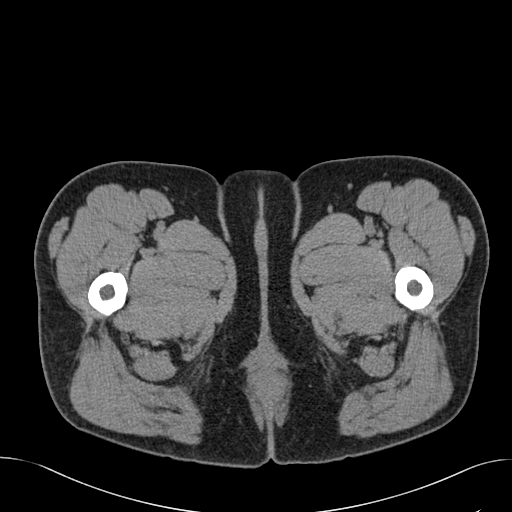
[im 7/161  bone]
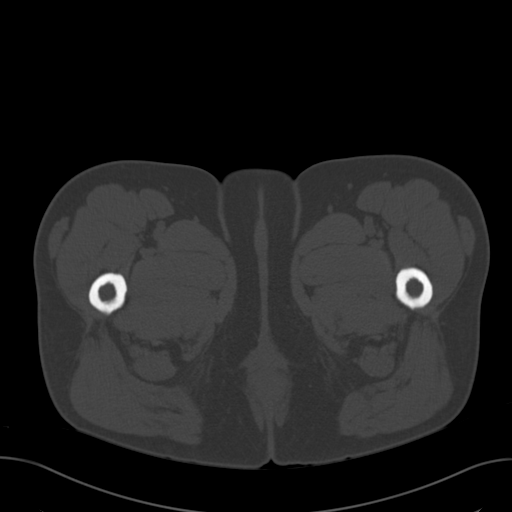
[im 20/161  soft-tissue]
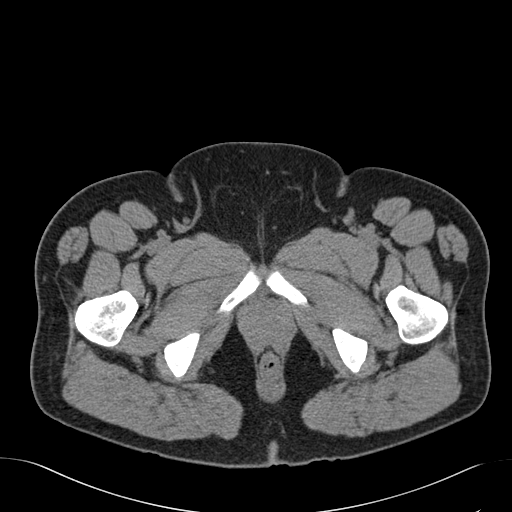
[im 33/161  soft-tissue]
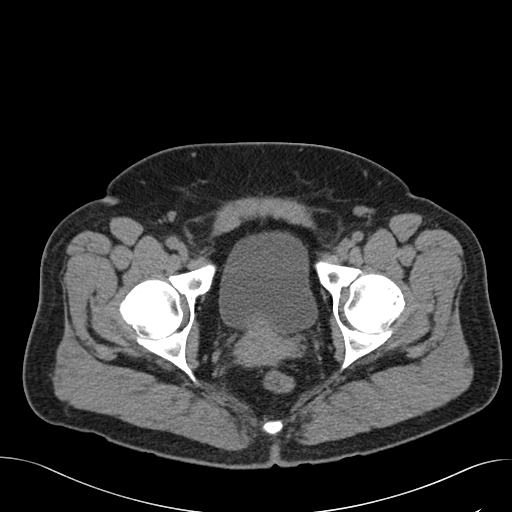
[im 45/161  soft-tissue]
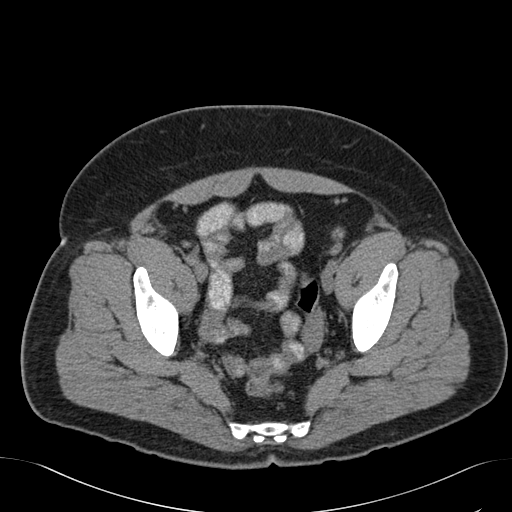
[im 58/161  soft-tissue]
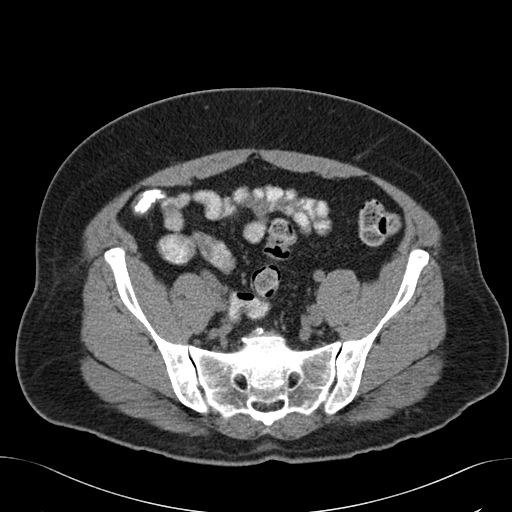
[im 71/161  soft-tissue]
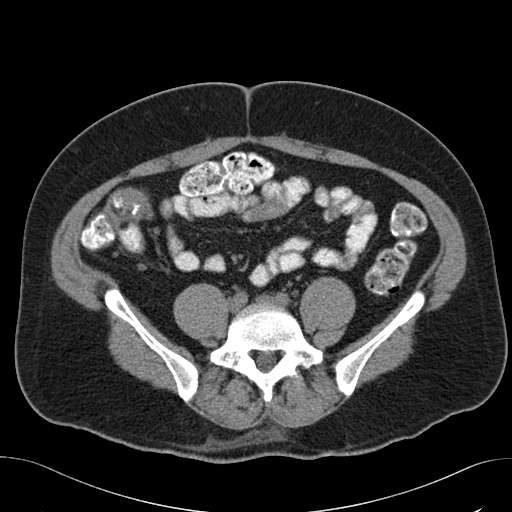
[im 84/161  soft-tissue]
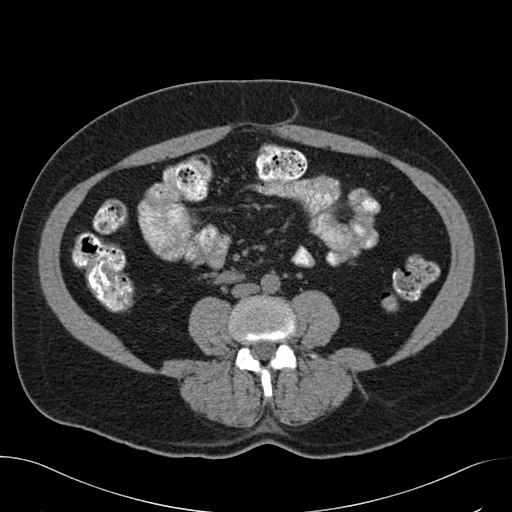
[im 90/161  soft-tissue]
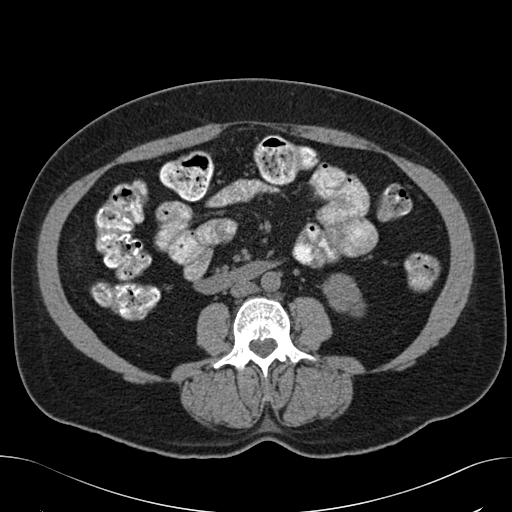
[im 103/161  soft-tissue]
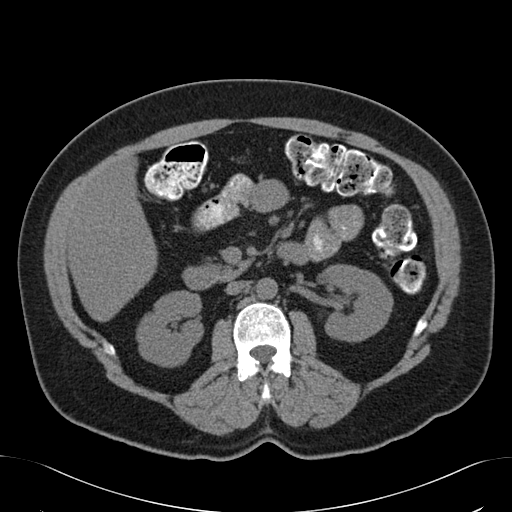
[im 103/161  bone]
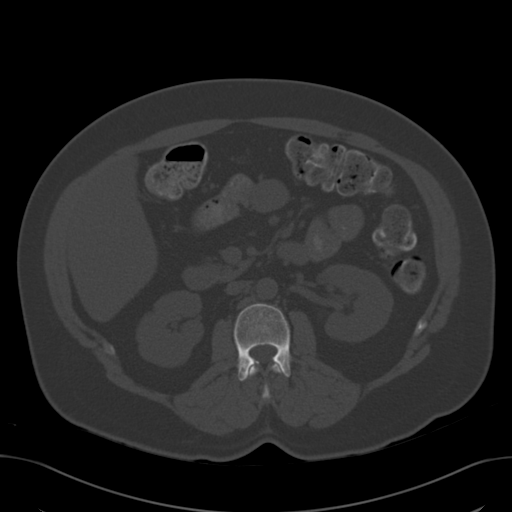
[im 116/161  soft-tissue]
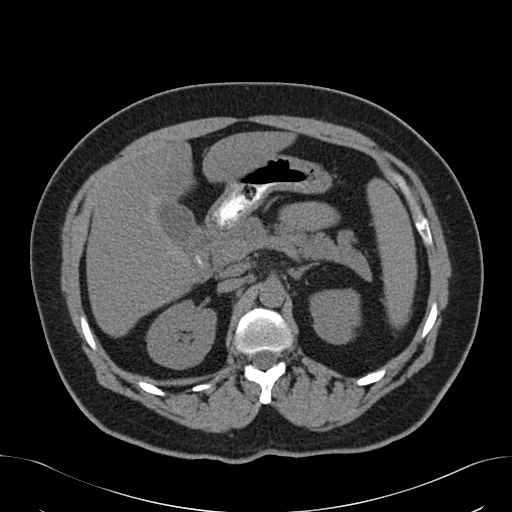
[im 129/161  soft-tissue]
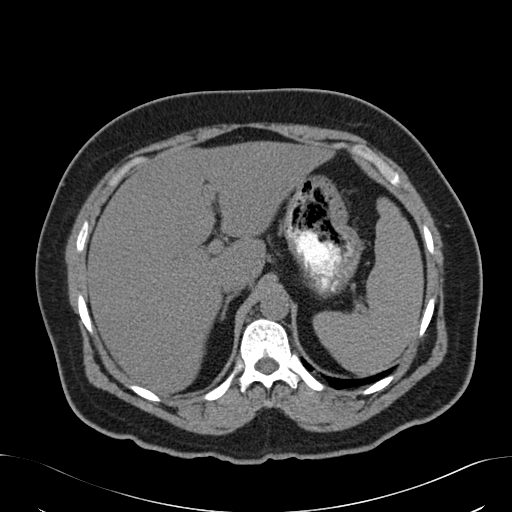
[im 135/161  lung]
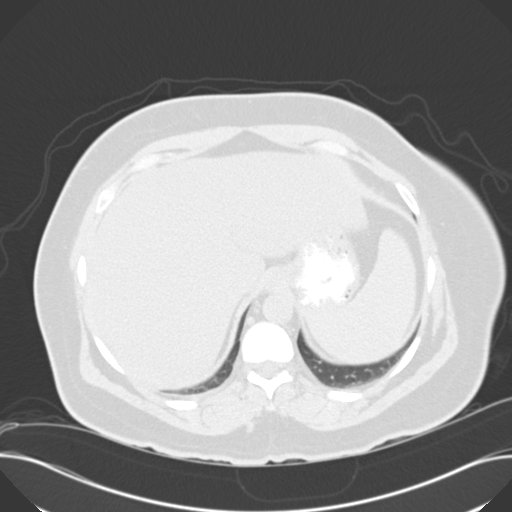
[im 141/161  soft-tissue]
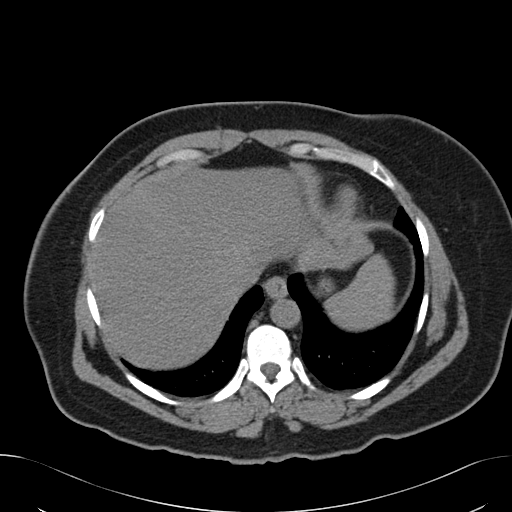
[im 141/161  lung]
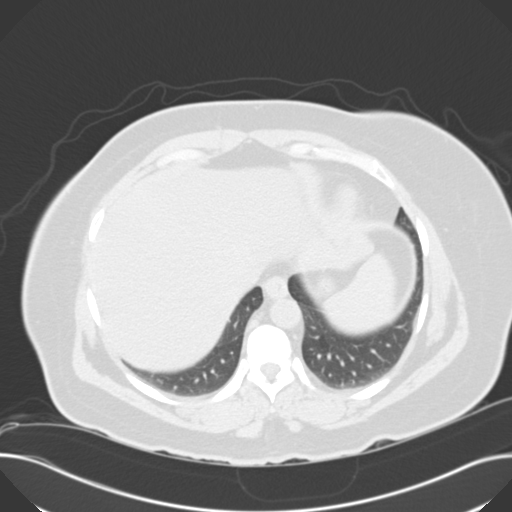
[im 148/161  lung]
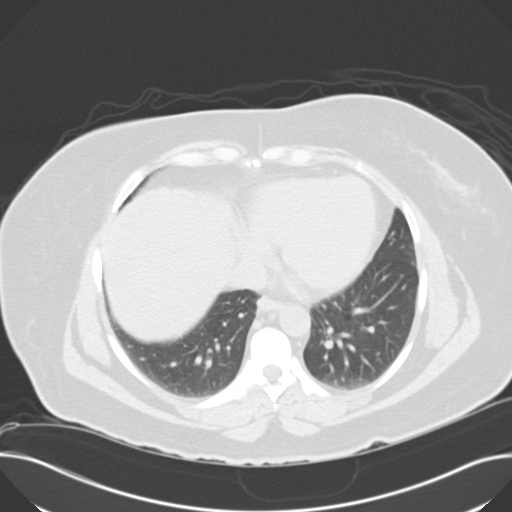
[im 154/161  soft-tissue]
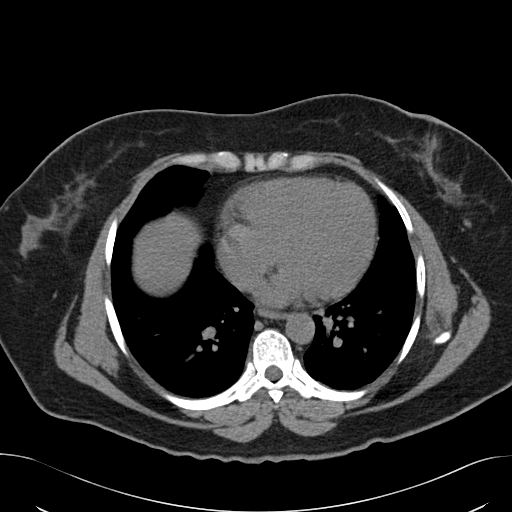
[im 154/161  lung]
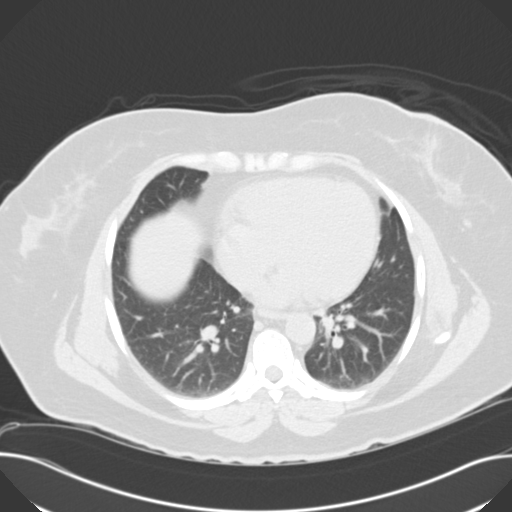

[15 of 32 positions shown; findings below may reference images not displayed]

PROCEDURE:     CT  - CT ABDOMEN AND PELVIS W[DATE]  [DATE]

RESULT:     CT of the abdomen and pelvis is performed with oral contrast
only and reconstructed at 3.0 mm slice thickness in the axial plane.
Comparison is made to the previous exam dated July 29, 2008 which was a
noncontrast exam.

The lung bases appear clear except for dependent atelectasis. Noncontrast
images of the kidneys, liver, spleen, gallbladder, pancreas, aorta and
adrenal glands appear unremarkable. No abnormal gastric, small bowel or
colonic distention is seen. No definite wall thickening or inflammatory
stranding is evident. There is no ascites or abnormal fluid collection. No
free air is seen. The uterus and urinary bladder appear unremarkable. No
adnexal mass is appreciated. The appendix appears normal. No adenopathy is
evident. Bony structures appear intact.
IMPRESSION: Unremarkable CT of the abdomen and pelvis. Evaluation of the solid viscera
is decreased given the lack of IV contrast.

## 2012-04-21 IMAGING — CR DG THORACIC SPINE 2-3V
1 series · 3 of 3 positions shown · non-contrast
Comparison: none

REASON FOR EXAM: trauma
COMMENTS:

[Series 1: t thoracic spine ap · 0.14mm/px · 3 of 3 slices shown]
[im 1/3]
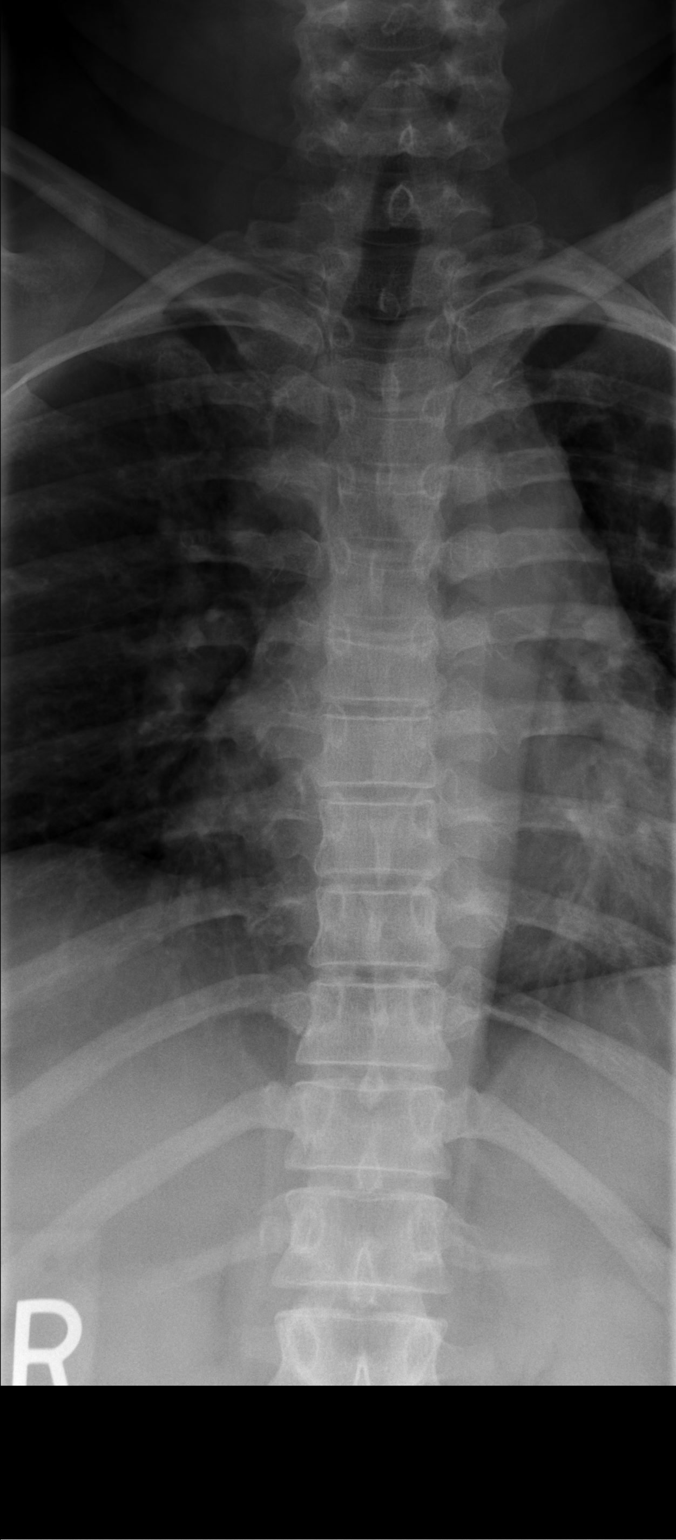
[im 2/3]
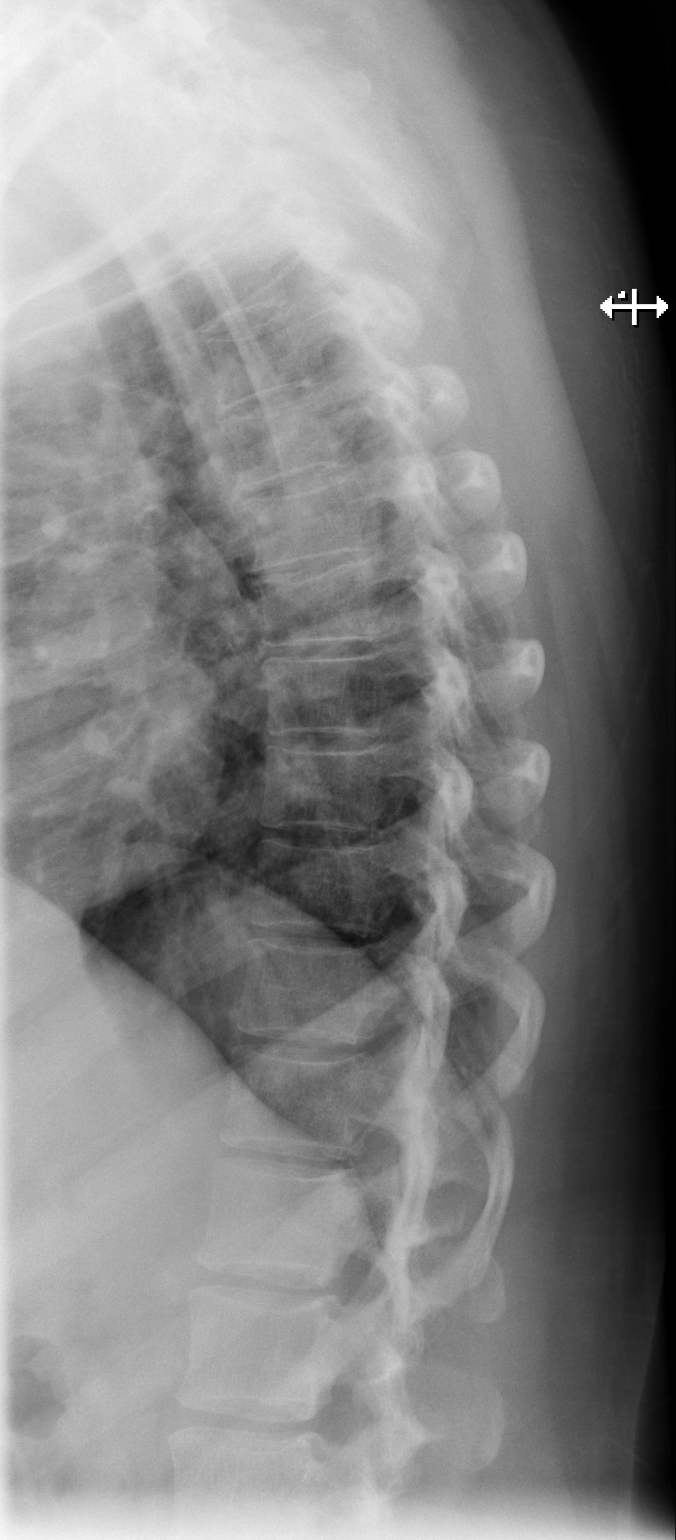
[im 3/3]
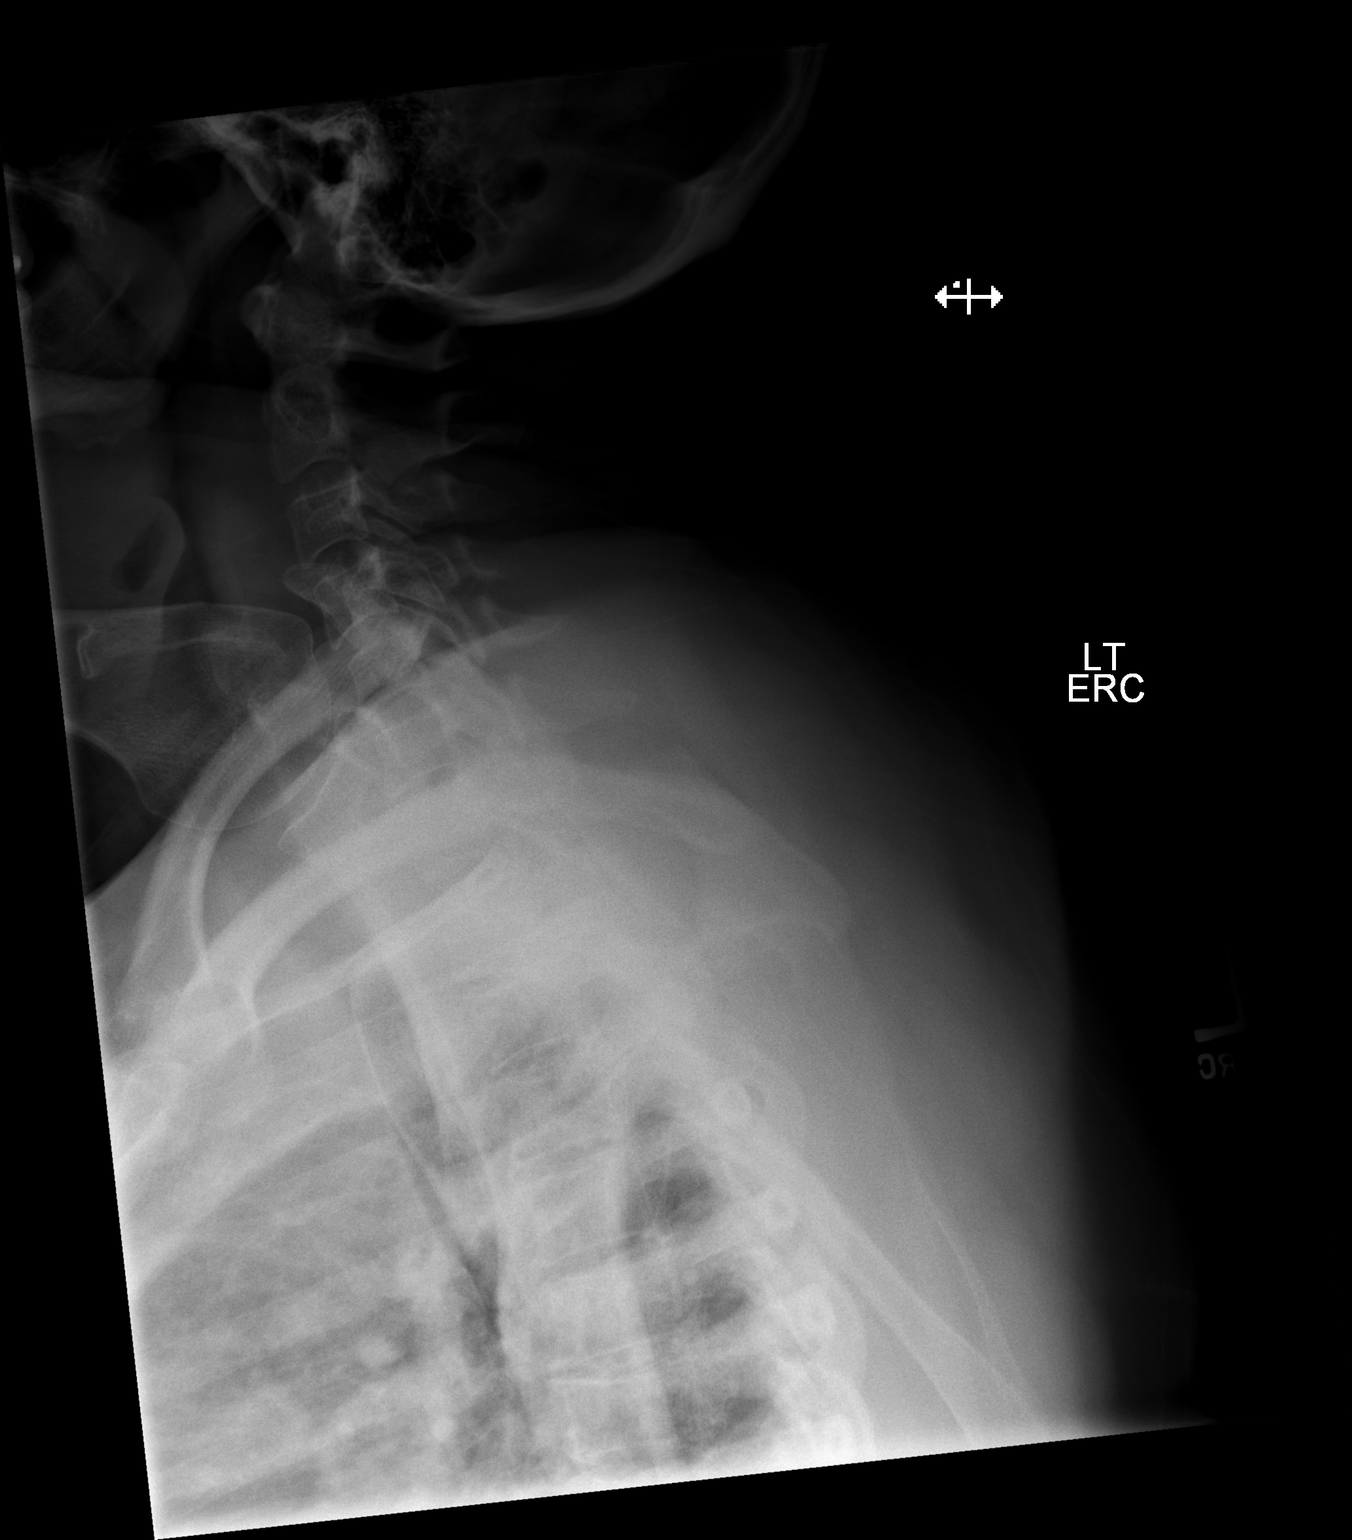

[3 of 3 positions shown; findings below may reference images not displayed]

PROCEDURE:     DXR - DXR THORACIC  AP AND LATERAL  - August 11, 2011  [DATE]

RESULT:     Images of the thoracic spine in AP and lateral projections show
compression fracture anteriorly involving the T6 vertebral body. The
posterior wall is maintained. No retropulsion of bony material is evident.
The superior end plate is involved. The inferior endplate appears to be
intact. On the frontal view this appears to be more to the left.
IMPRESSION: T6 compression fracture anteriorly toward the left. No
other acute bony abnormality or significant degenerative change demonstrated.

## 2012-04-21 IMAGING — CR DG LUMBAR SPINE 2-3V
1 series · 3 of 3 positions shown · non-contrast
Comparison: none

REASON FOR EXAM: trauma
COMMENTS:

[Series 1: t lumbar spine ap · 0.14mm/px · 3 of 3 slices shown]
[im 1/3]
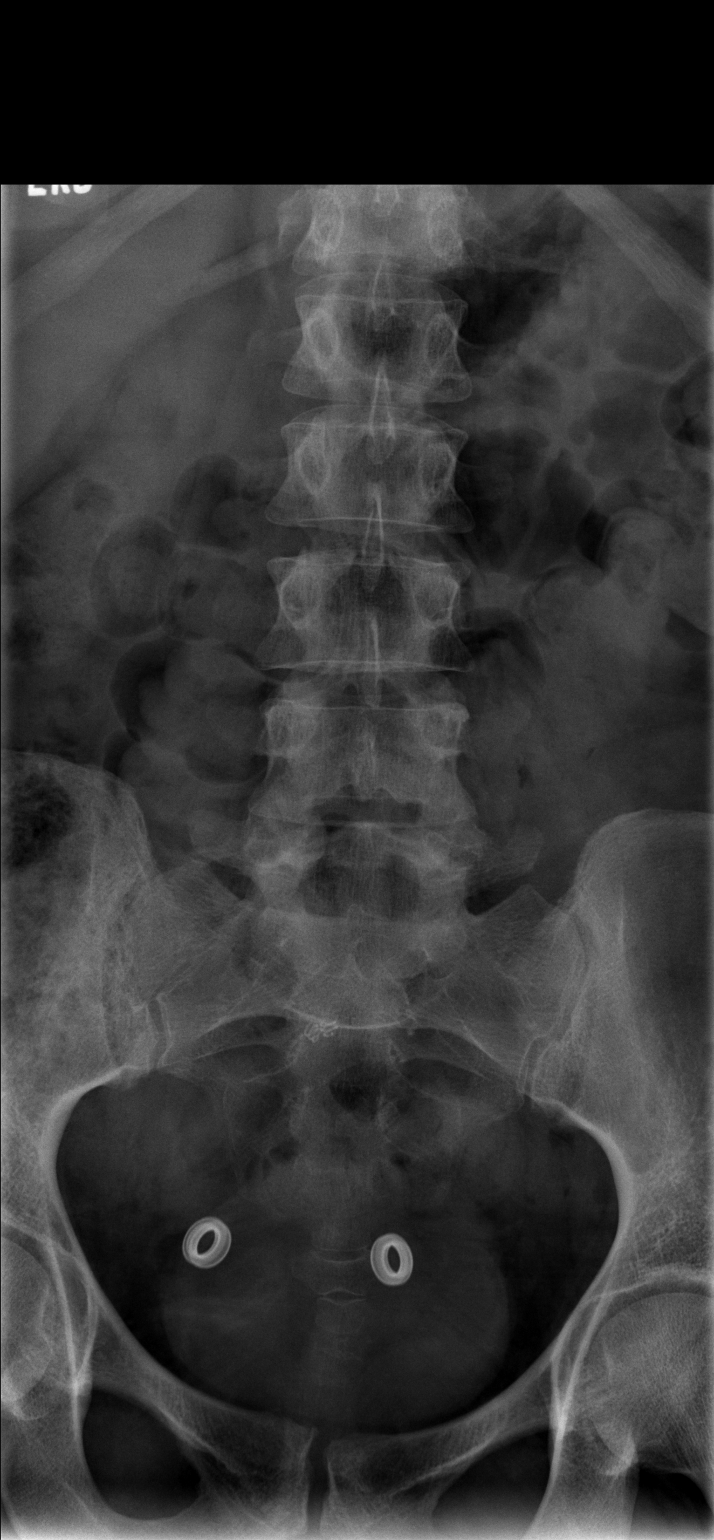
[im 2/3]
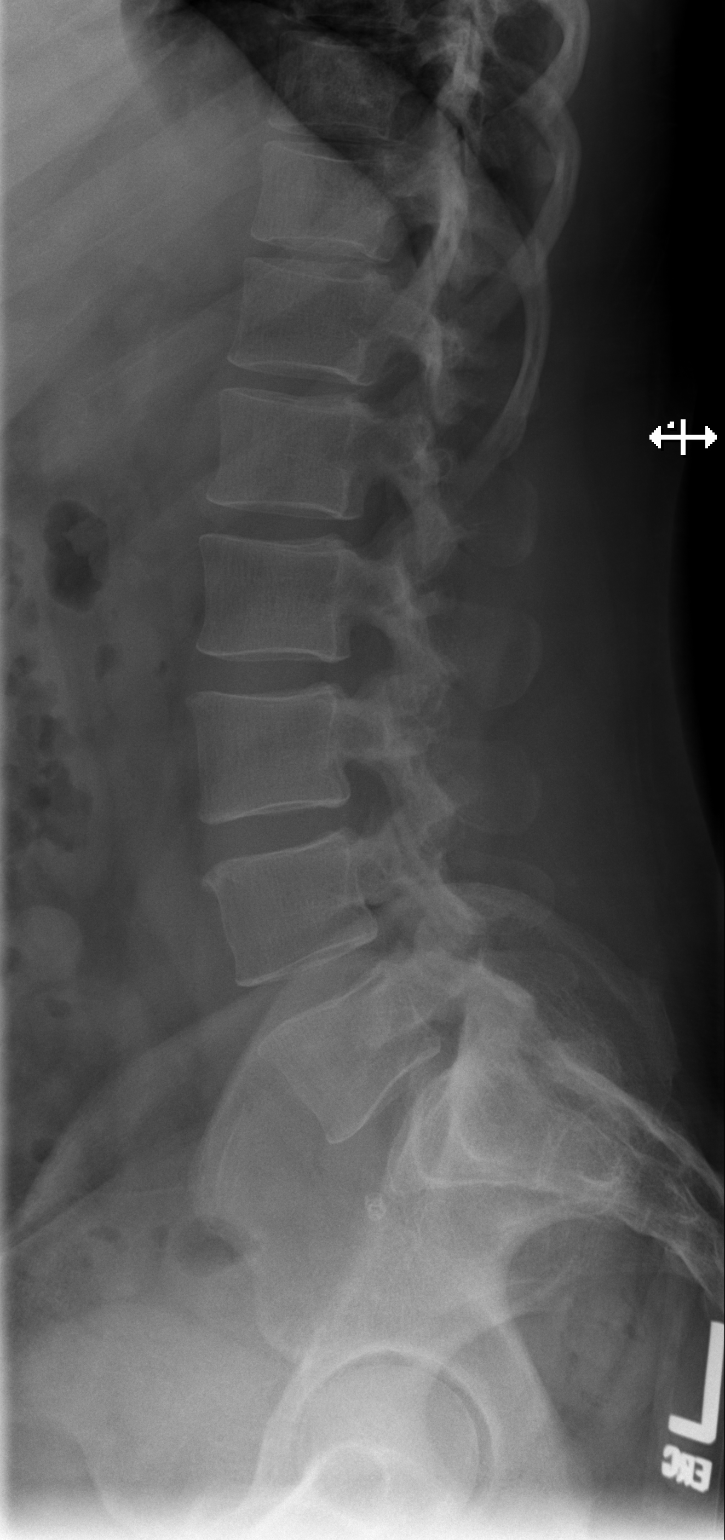
[im 3/3]
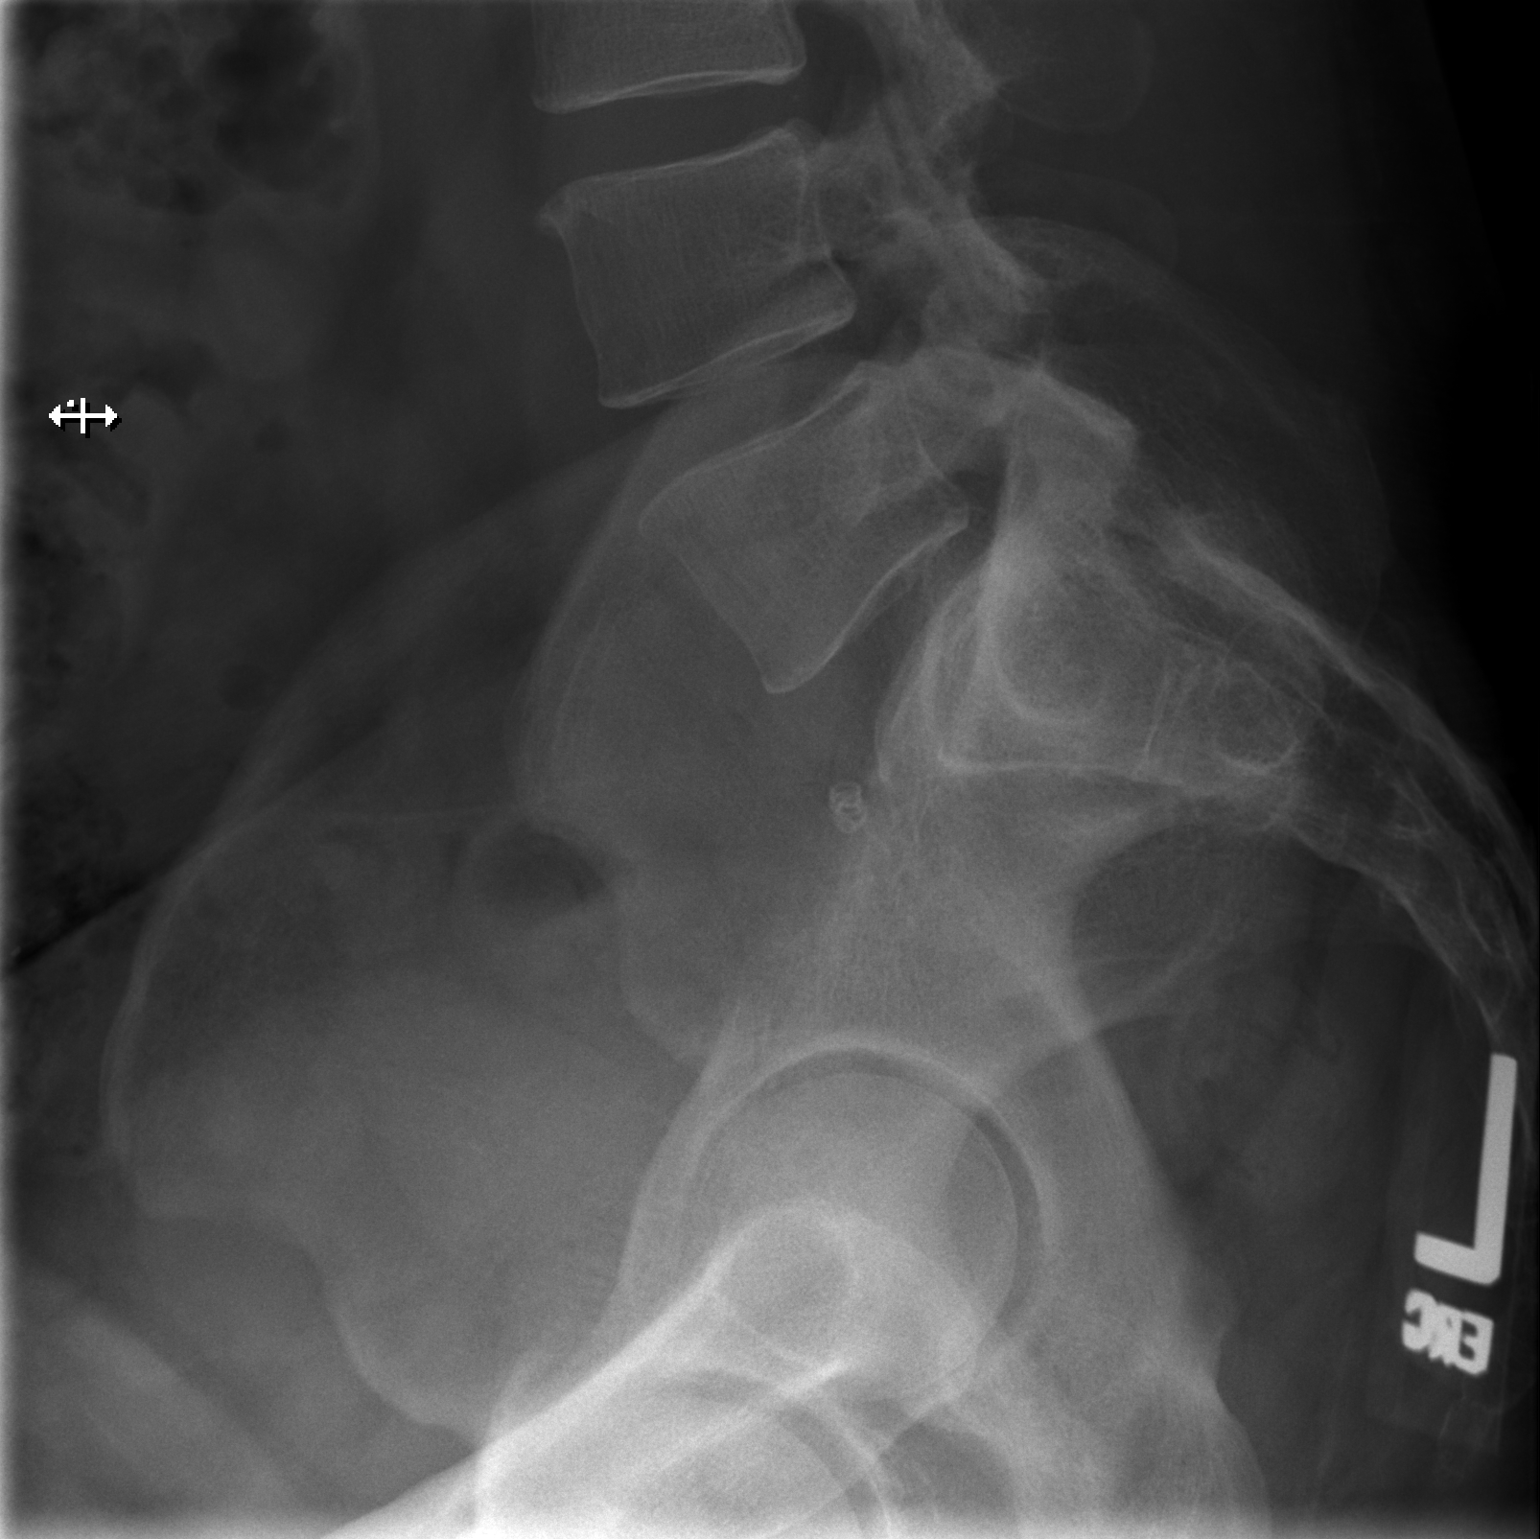

[3 of 3 positions shown; findings below may reference images not displayed]

PROCEDURE:     DXR - DXR LUMBAR SPINE AP AND LATERAL  - August 11, 2011  [DATE]

RESULT:     Comparison is made to previous lumbar spine images 26 May, 2009 and 25 June, 2010.

The vertebral body heights and intervertebral disc spaces are maintained.
The alignment is normal. No compression deformity is evident. There is no
significant change.
IMPRESSION: 1. No degenerative change.
2. No acute bony abnormality evident.

## 2012-04-21 IMAGING — CT CT THORACIC SPINE WITHOUT CONTRAST
1 series · 12 of 14 positions shown, 15 images · non-contrast
Comparison: none

REASON FOR EXAM: trauma
COMMENTS:

[Series 3: axials · axial · 0.32mm/px · z∈[-666,-434]mm · 12 of 92 slices shown, 15 images]
[im 8/92  soft-tissue]
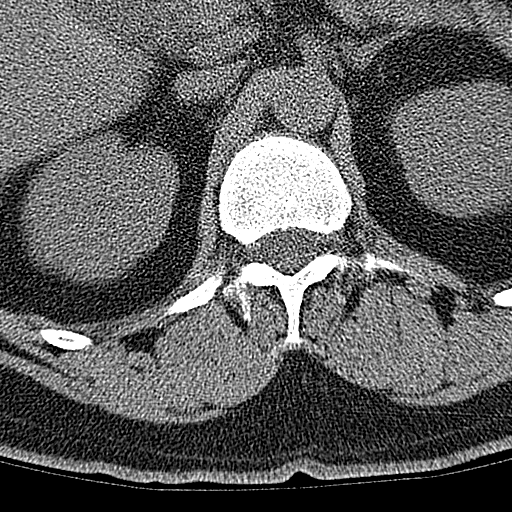
[im 8/92  bone]
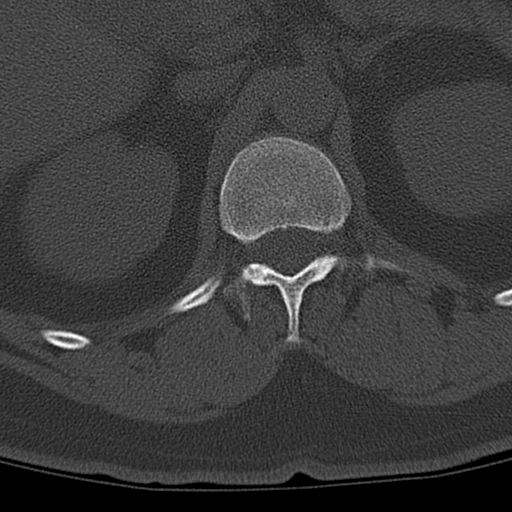
[im 15/92  bone]
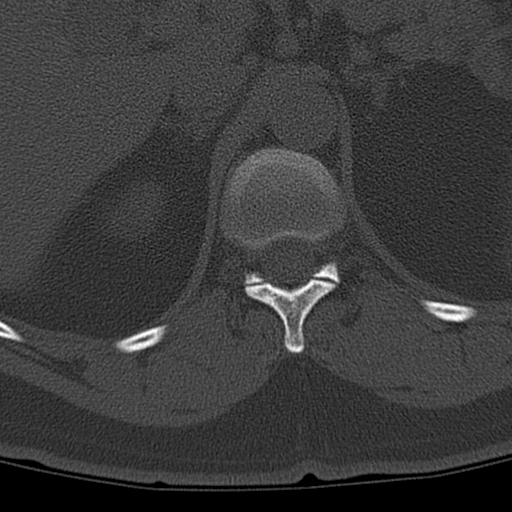
[im 22/92  bone]
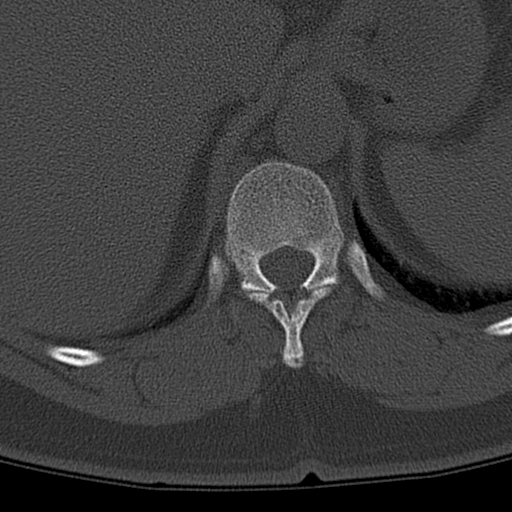
[im 29/92  bone]
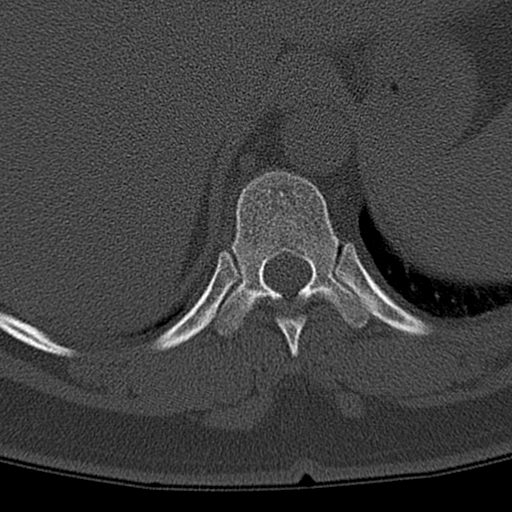
[im 36/92  soft-tissue]
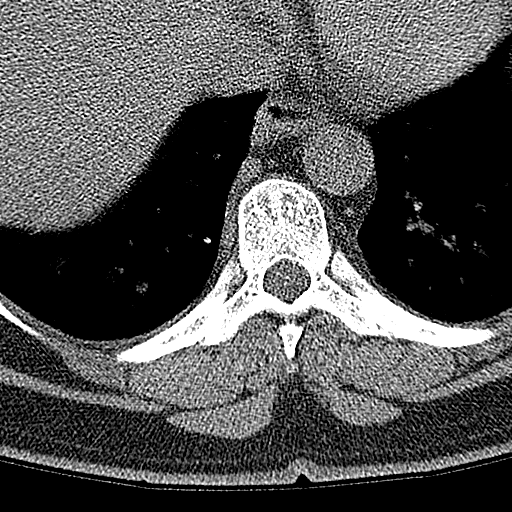
[im 36/92  bone]
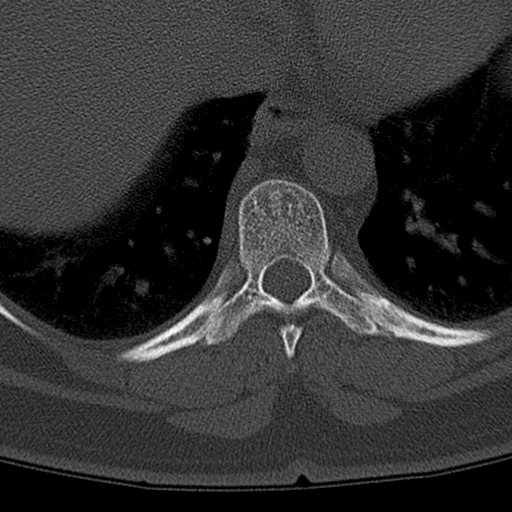
[im 43/92  bone]
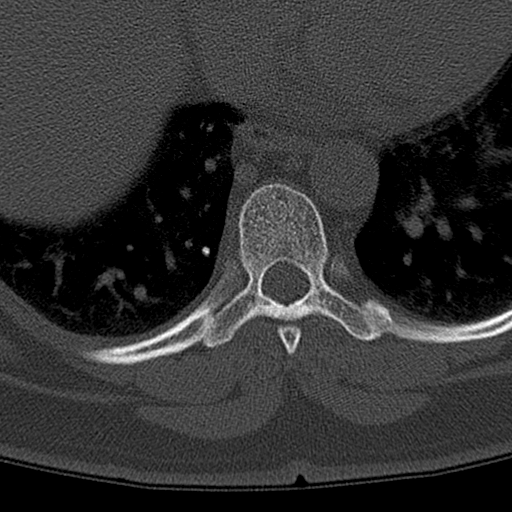
[im 50/92  bone]
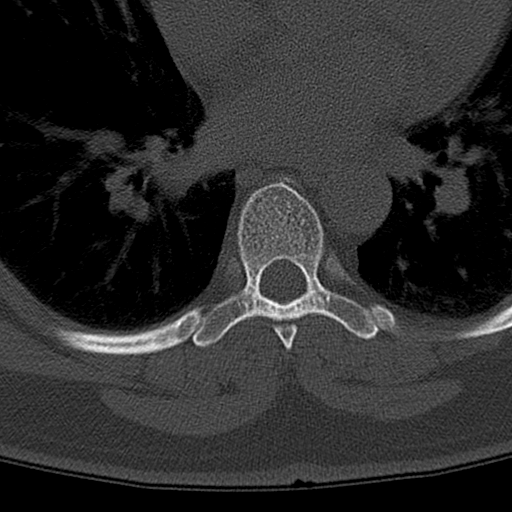
[im 57/92  bone]
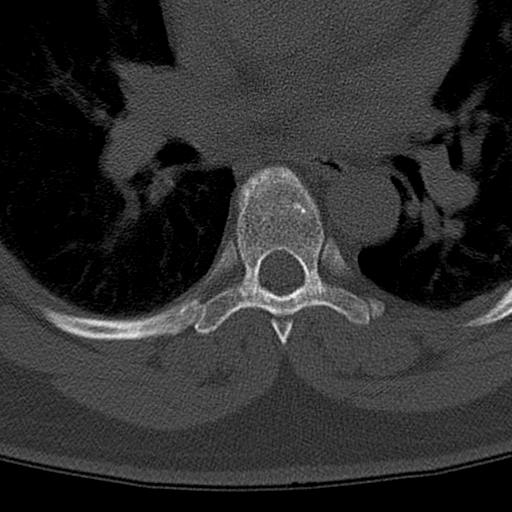
[im 64/92  soft-tissue]
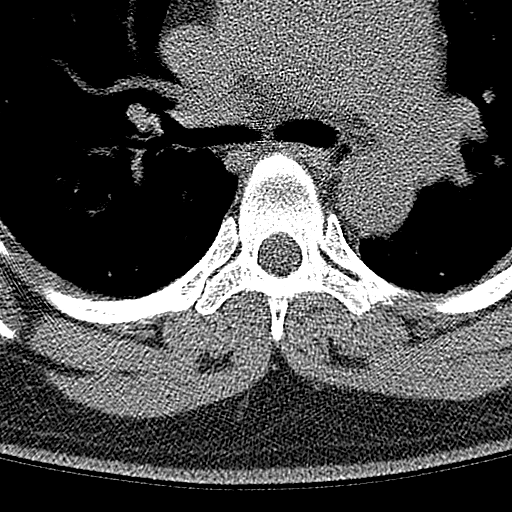
[im 64/92  bone]
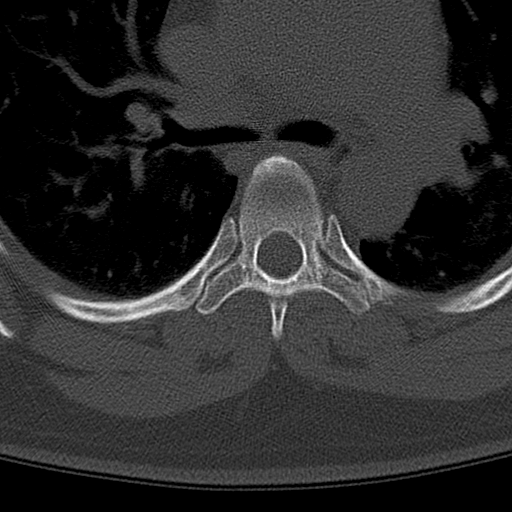
[im 71/92  bone]
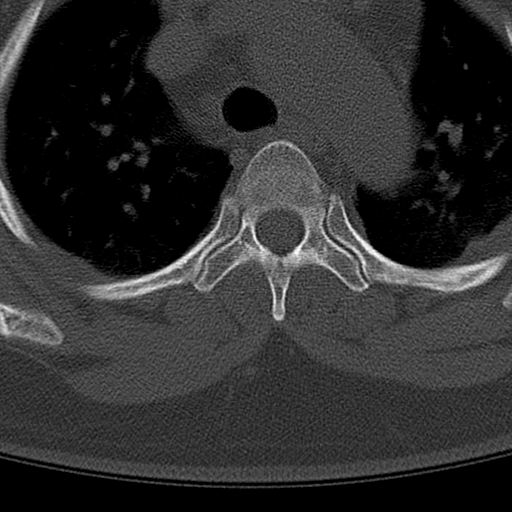
[im 78/92  bone]
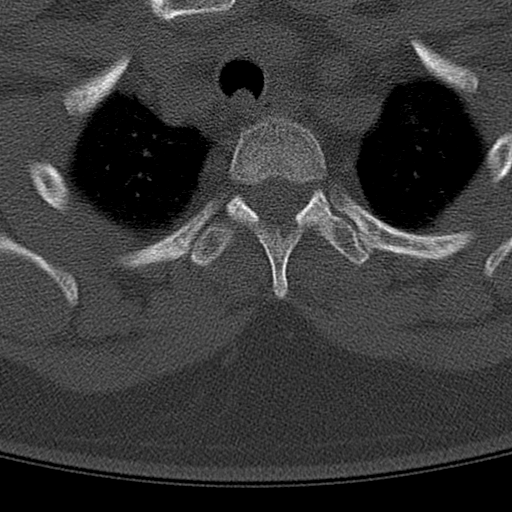
[im 85/92  bone]
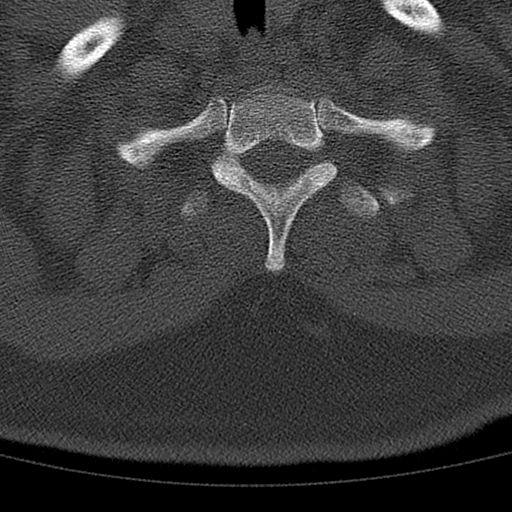

[12 of 14 positions shown; findings below may reference images not displayed]

PROCEDURE:     CT  - CT THORACIC SPINE WO  - August 11, 2011  [DATE]

RESULT:     Multislice helical acquisition through the thoracic spine is
performed. A in reconstructed in the axial, coronal and sagittal planes at
3.0 mm slice thickness. Correlation is made with previous radiographs which
demonstrates an slight loss of height along the anterior left T6 vertebral
body consistent with compression fracture.

Images do not demonstrate a definite fracture in this region. There is some
slight concavity in the superior endplate at this level no definite cortical
disruption is not identified. The posterior elements appear intact
throughout the thoracic spine. The included ribs appear to be unremarkable.
IMPRESSION: 1. Slight deformity of the T6 superior endplate without a definite
compression fracture. Incidental note is made of some anterior spurring at
multiple levels in the midthoracic region. No acute bony abnormality
appreciated.

## 2012-04-29 IMAGING — CR DG LUMBAR SPINE 2-3V
1 series · 3 of 3 positions shown · non-contrast
Comparison: none

REASON FOR EXAM: fell pain at about l 3
COMMENTS:

PROCEDURE:     DXR - DXR LUMBAR SPINE AP AND LATERAL  - June 26, 2010 [DATE]
RESULT:     Comparison is made to a prior exam of 05/26/2009.
The vertebral body heights and intervertebral disc spaces are well
maintained. The vertebral body alignment is normal. The pedicles are
bilaterally intact.

[Series 1: view not recorded · 0.17mm/px · 3 of 3 slices shown]
[im 1/3]
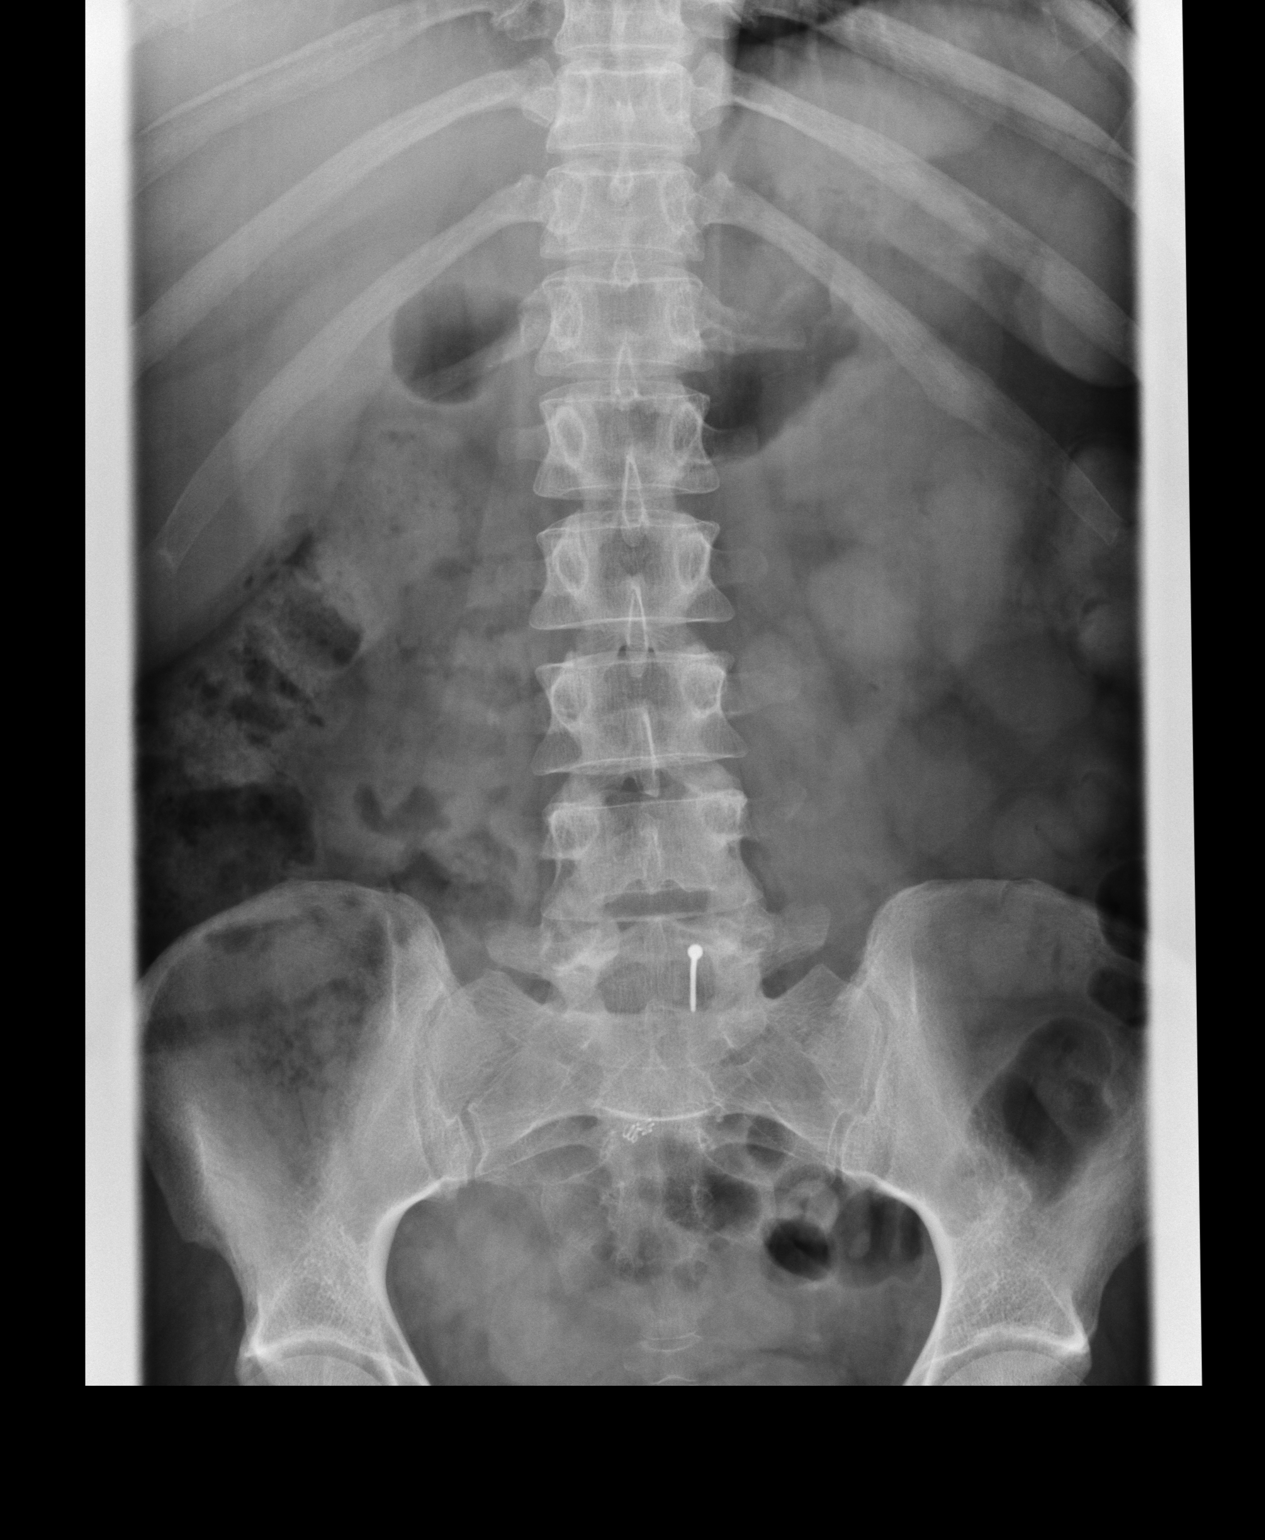
[im 2/3]
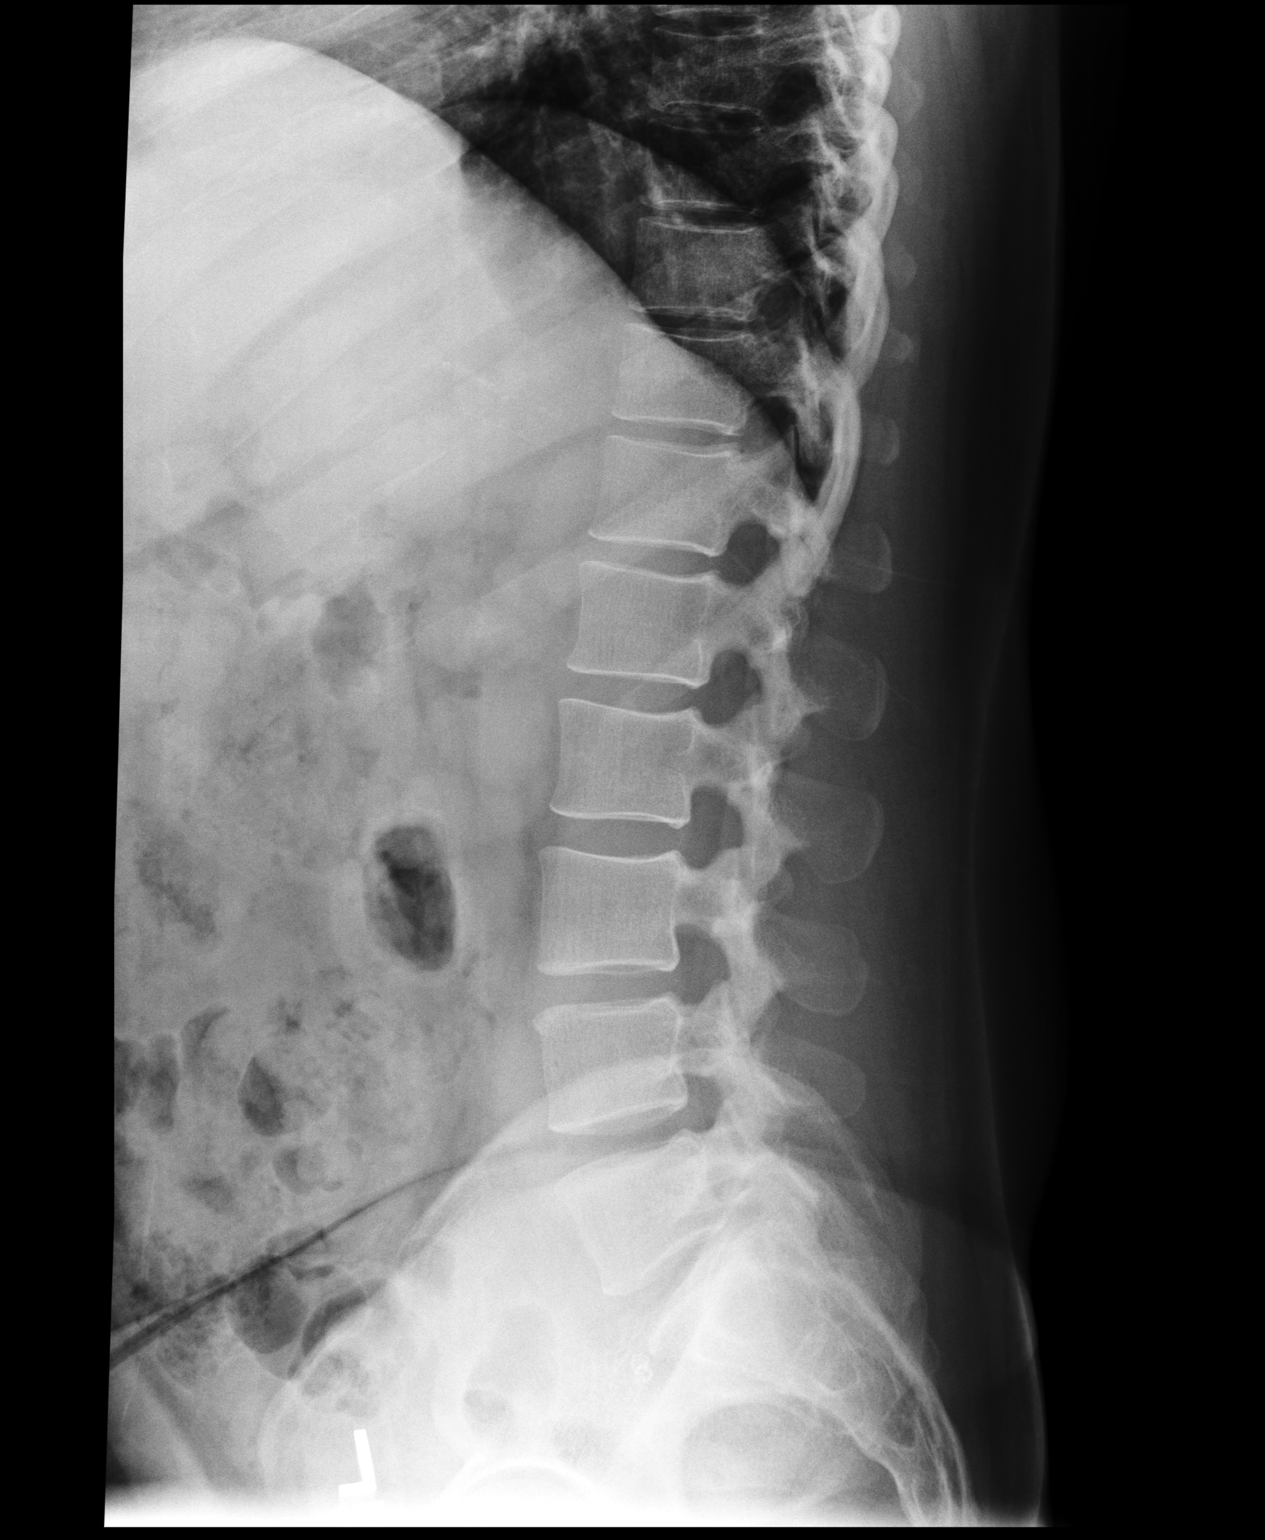
[im 3/3]
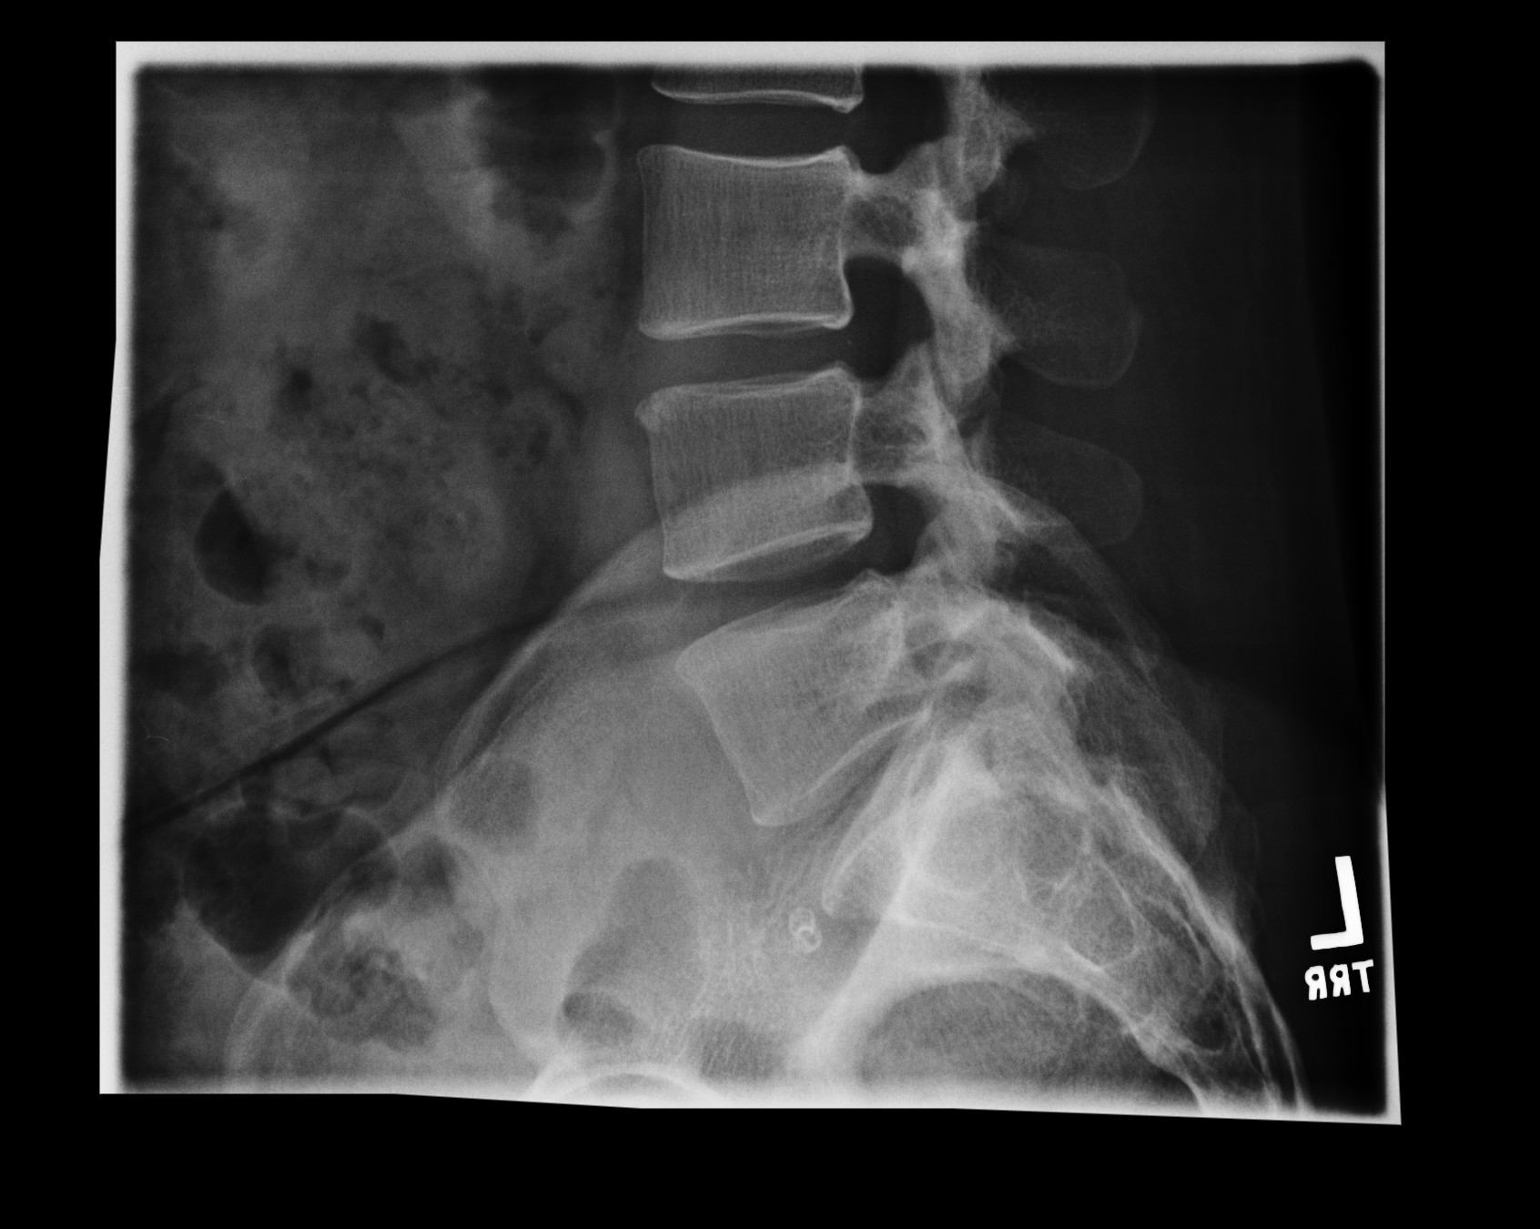

[3 of 3 positions shown; findings below may reference images not displayed]

IMPRESSION: No significant osseous abnormalities are noted.

## 2012-04-29 IMAGING — CT CT CERVICAL SPINE WITHOUT CONTRAST
1 series · 12 of 14 positions shown, 15 images · non-contrast
Comparison: none

REASON FOR EXAM: fell yest and again today, c/o headache and neck pain
COMMENTS:

PROCEDURE:     CT  - CT CERVICAL SPINE WO  - June 25, 2010 [DATE]
RESULT:     CT cervical spine
TECHNIQUE: Helical 2 mm sections were obtained. Reconstructions were
performed utilizing a bone algorithm in coronal, sagittal, and axial planes.

[Series 5: axial · axial · 0.33mm/px · z∈[+220,+344]mm · 12 of 79 slices shown, 15 images]
[im 7/79  soft-tissue]
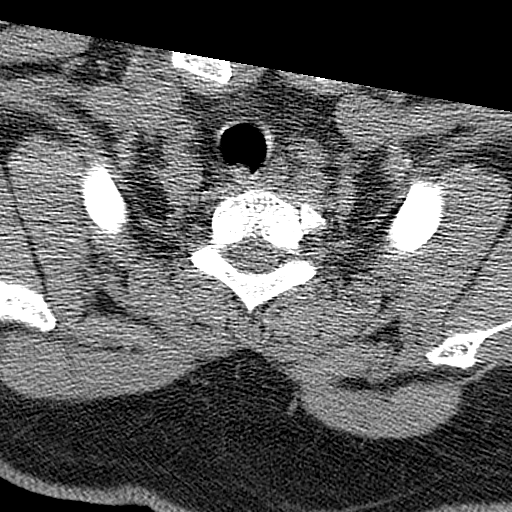
[im 7/79  bone]
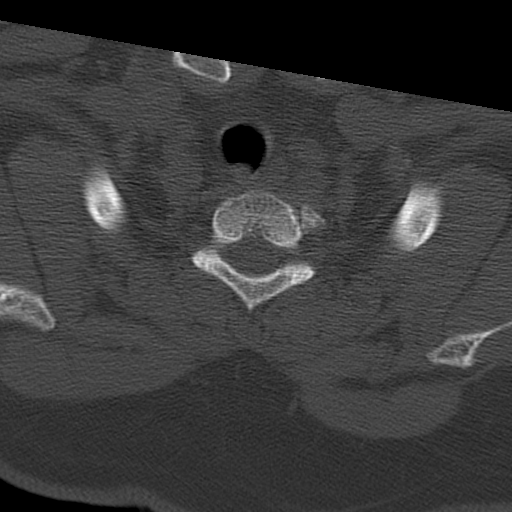
[im 13/79  bone]
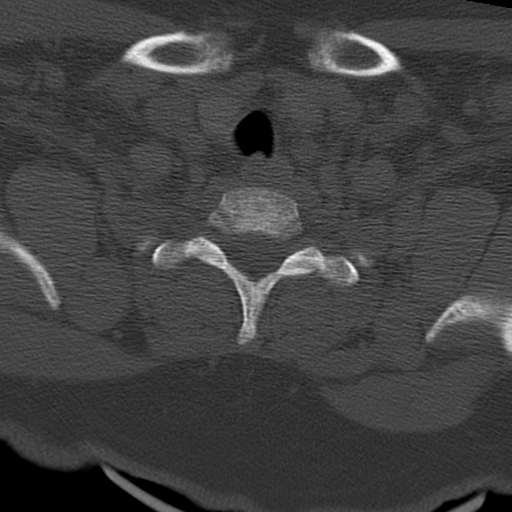
[im 19/79  bone]
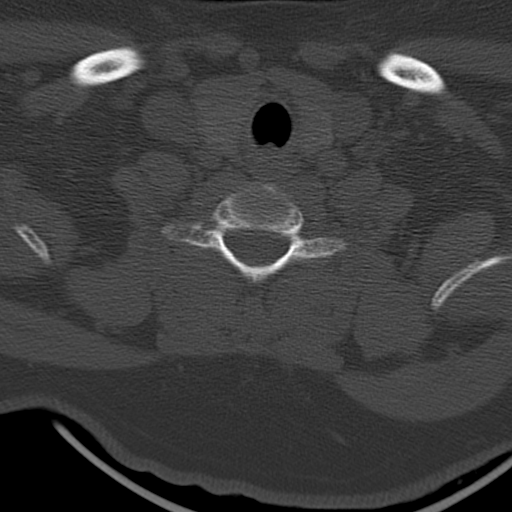
[im 25/79  bone]
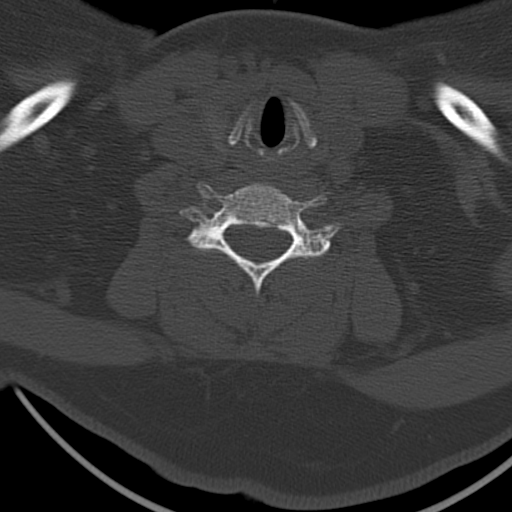
[im 31/79  soft-tissue]
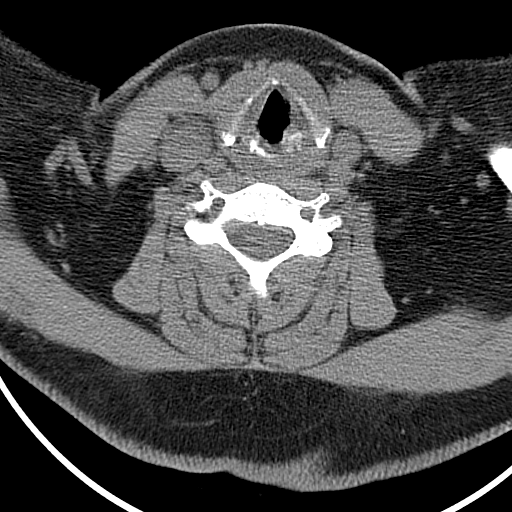
[im 31/79  bone]
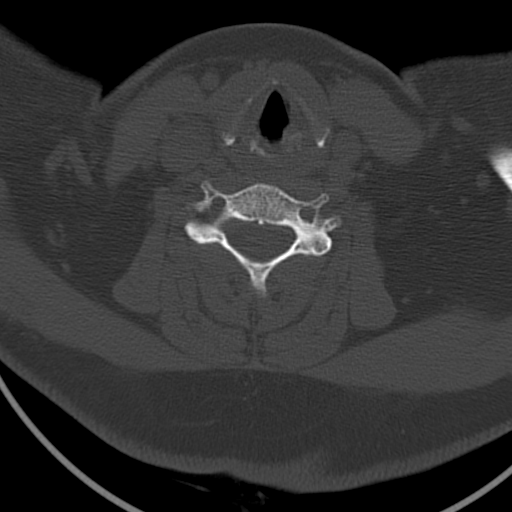
[im 37/79  bone]
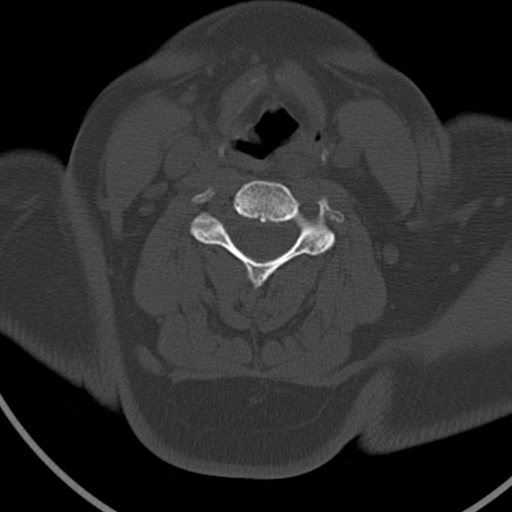
[im 43/79  bone]
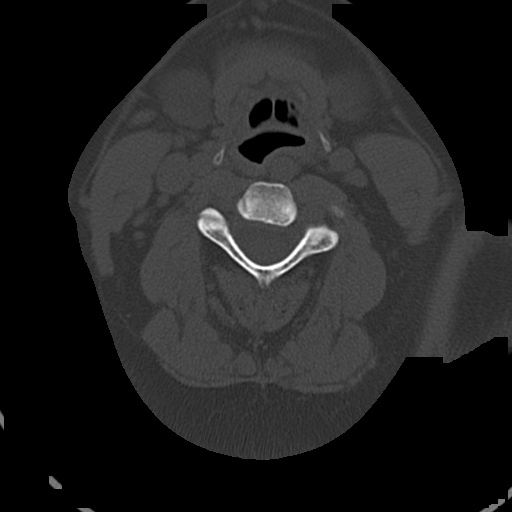
[im 49/79  bone]
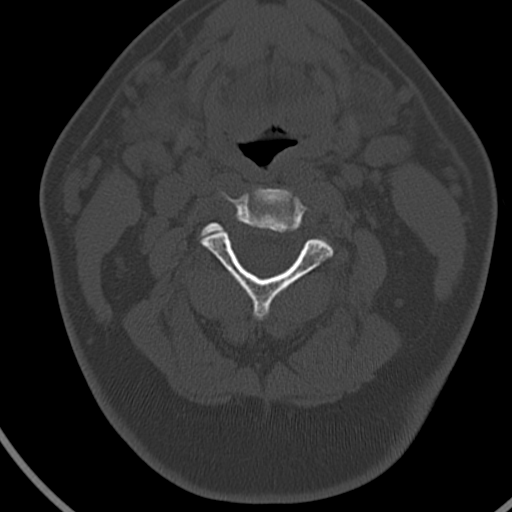
[im 55/79  soft-tissue]
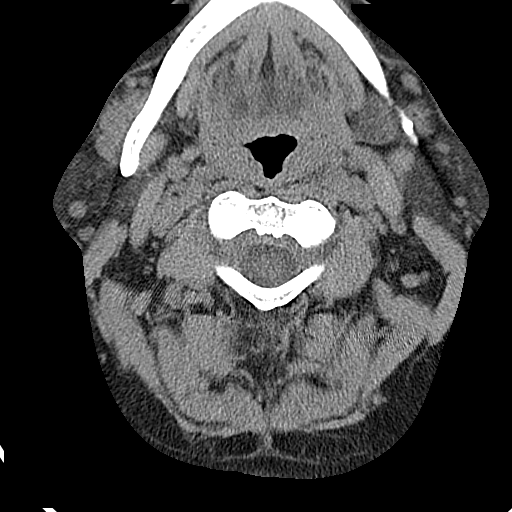
[im 55/79  bone]
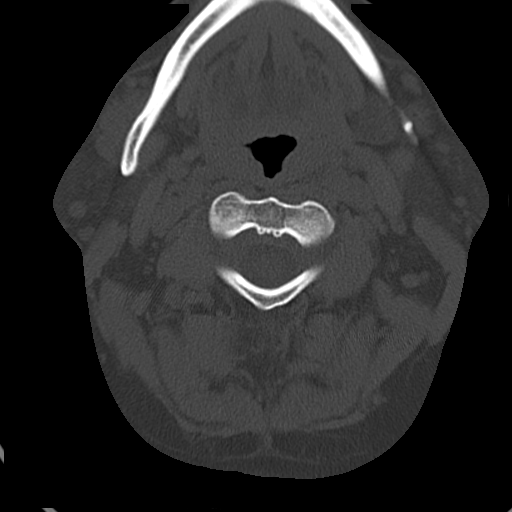
[im 61/79  bone]
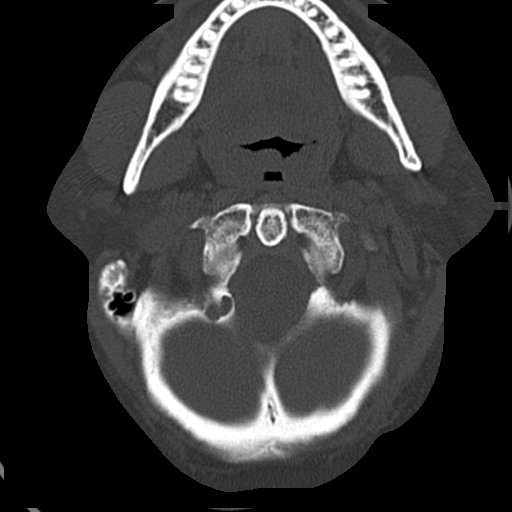
[im 67/79  bone]
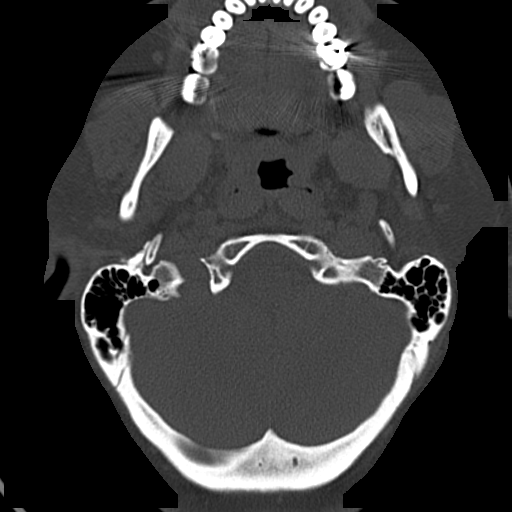
[im 73/79  bone]
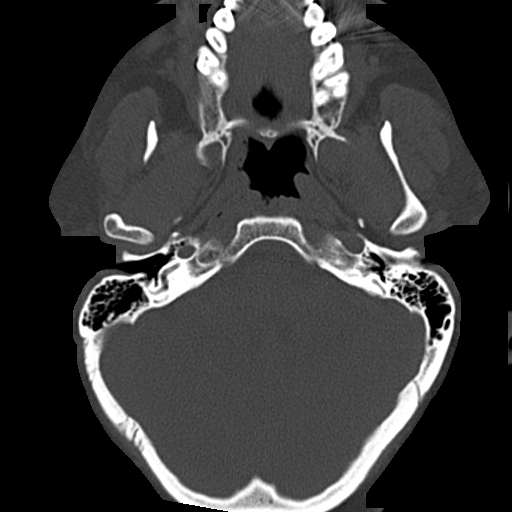

[12 of 14 positions shown; findings below may reference images not displayed]

FINDINGS: There is no evidence of acute fracture, dislocation, or
malalignment. There is no evidence of prevertebral soft tissue swelling nor
evidence of canal stenosis.
IMPRESSION: No CT evidence of acute osseous abnormalities.
2. Comparison made to prior study dated 05/26/2009.

## 2012-04-29 IMAGING — CT CT HEAD WITHOUT CONTRAST
2 series · 16 of 30 positions shown, 20 images · non-contrast
Comparison: none

REASON FOR EXAM: fell yest and agaon today c/o headache, neck pain
COMMENTS:

PROCEDURE:     CT  - CT HEAD WITHOUT CONTRAST  - June 25, 2010 [DATE]
RESULT:     Technique: Helical 5mm sections were obtained from the skull
base to the vertex without administration of intravenous contrast.

[Series 2: without · axial · non-contrast · 0.39mm/px · z∈[+350,+470]mm · 13 of 29 slices shown, 17 images]
[im 3/29  brain]
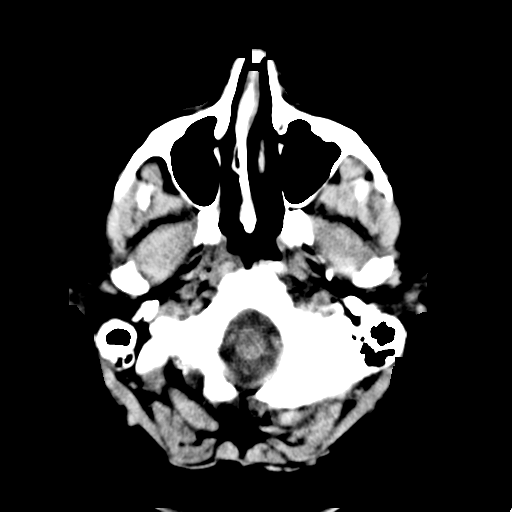
[im 3/29  bone]
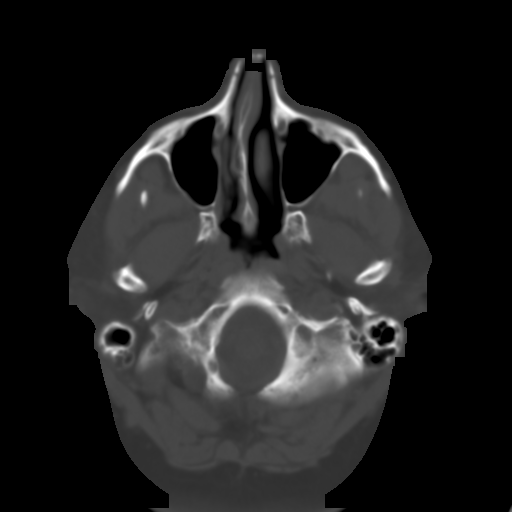
[im 5/29  brain]
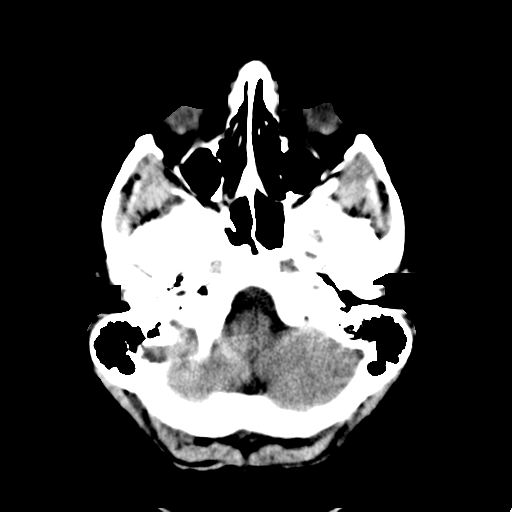
[im 7/29  brain]
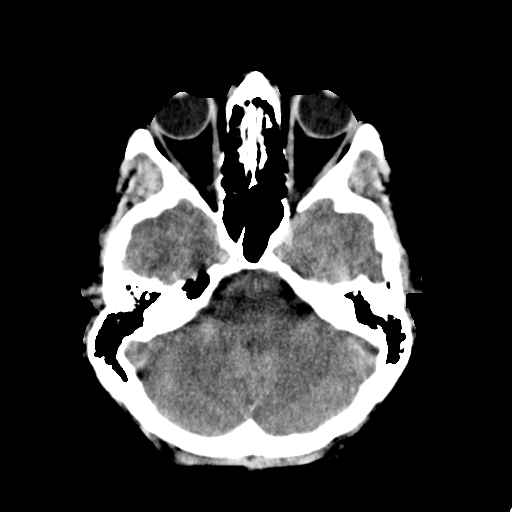
[im 9/29  brain]
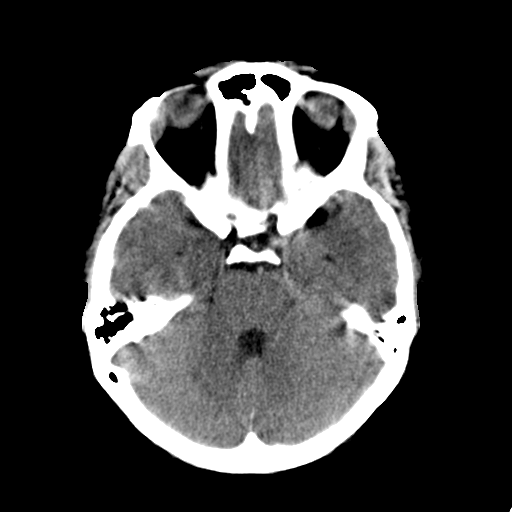
[im 11/29  brain]
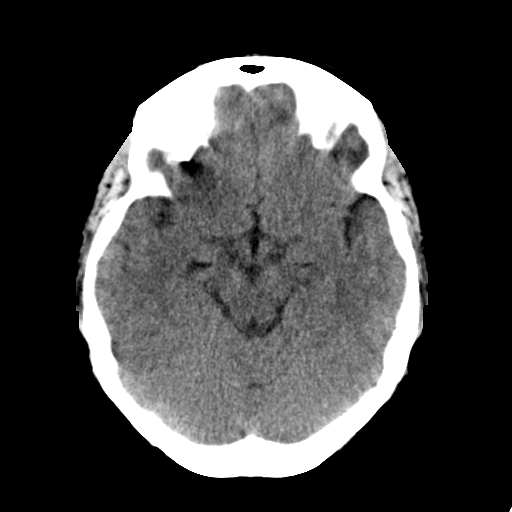
[im 11/29  bone]
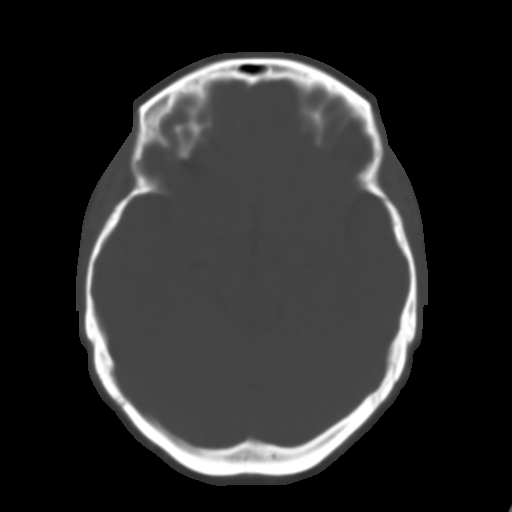
[im 13/29  brain]
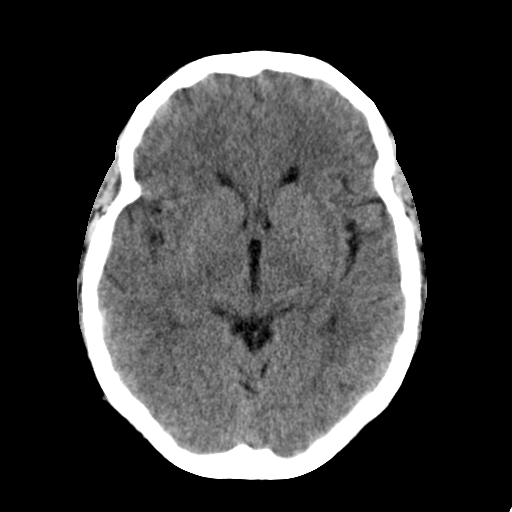
[im 15/29  brain]
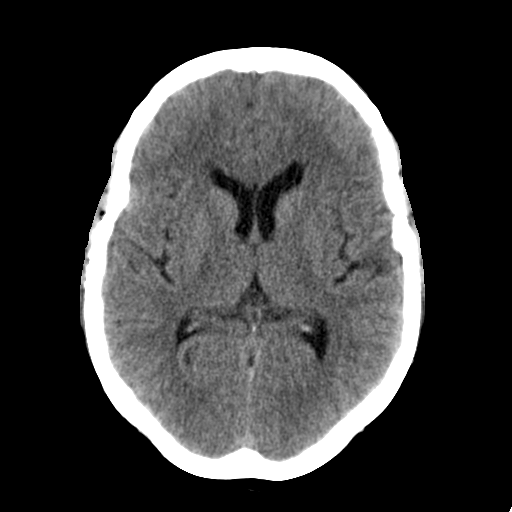
[im 17/29  brain]
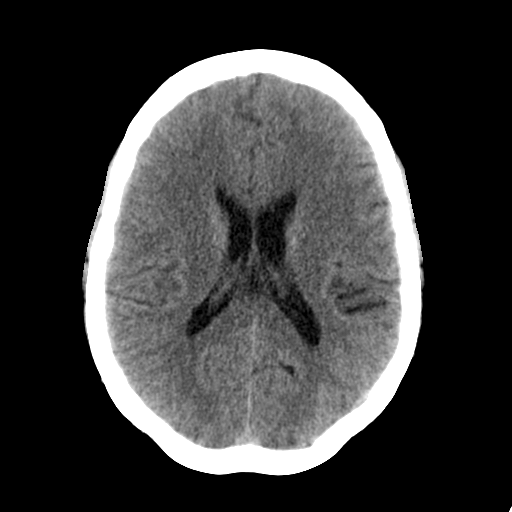
[im 19/29  brain]
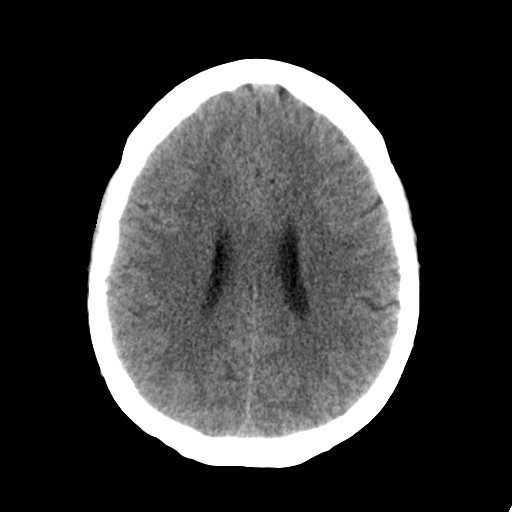
[im 19/29  bone]
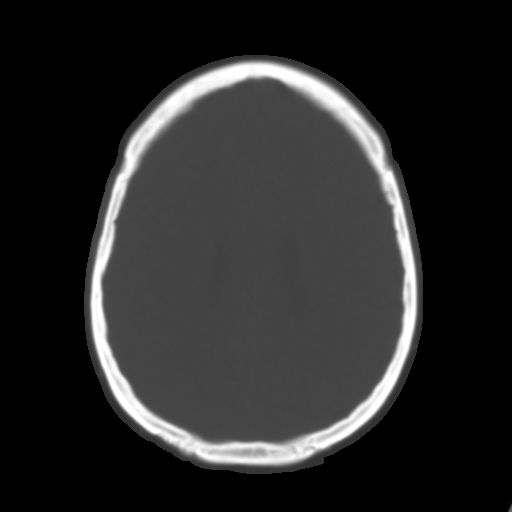
[im 21/29  brain]
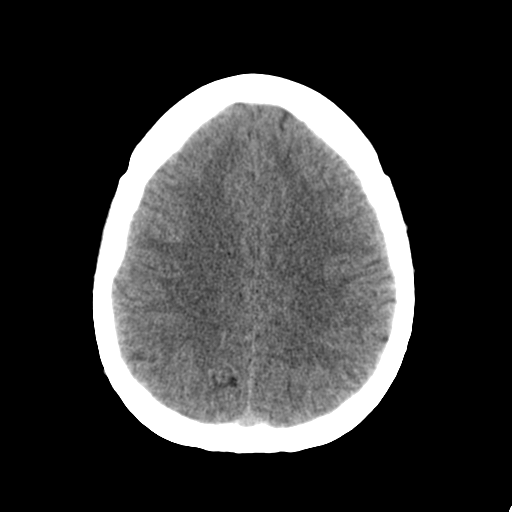
[im 23/29  brain]
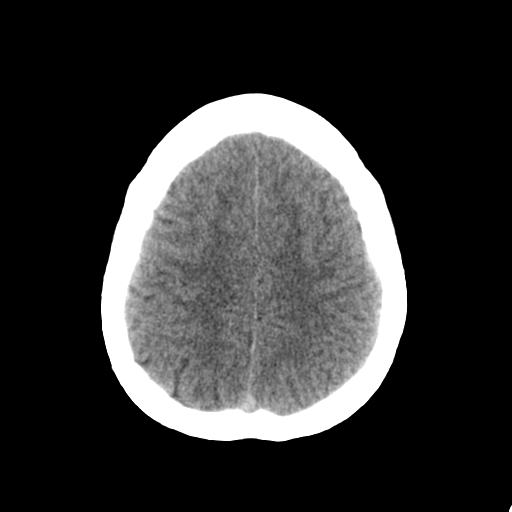
[im 25/29  brain]
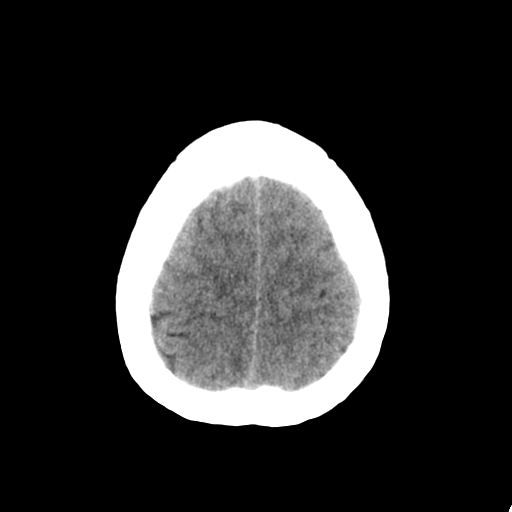
[im 27/29  brain]
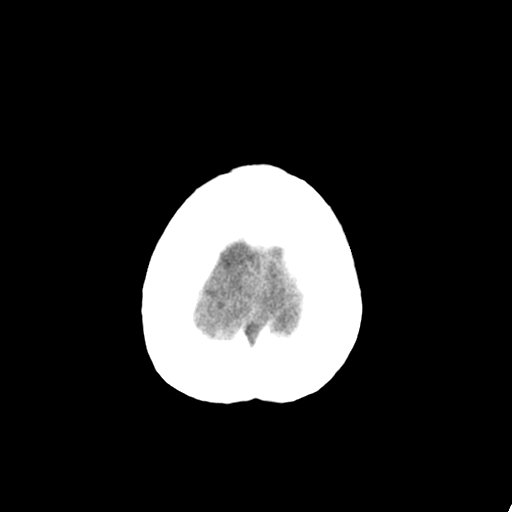
[im 27/29  bone]
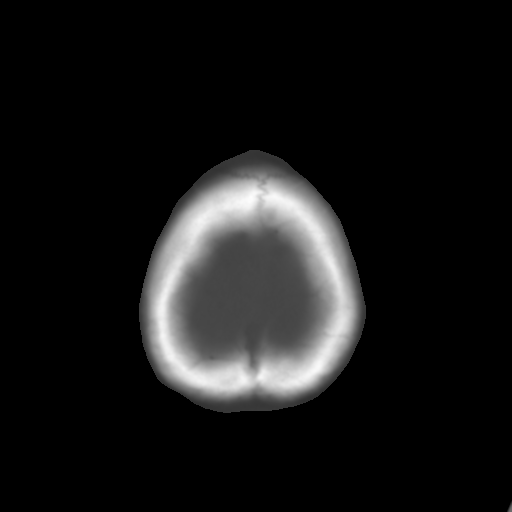

[Series 3: bone · axial · 0.39mm/px · z∈[+350,+390]mm · 3 of 29 slices shown]
[im 3/29  bone]
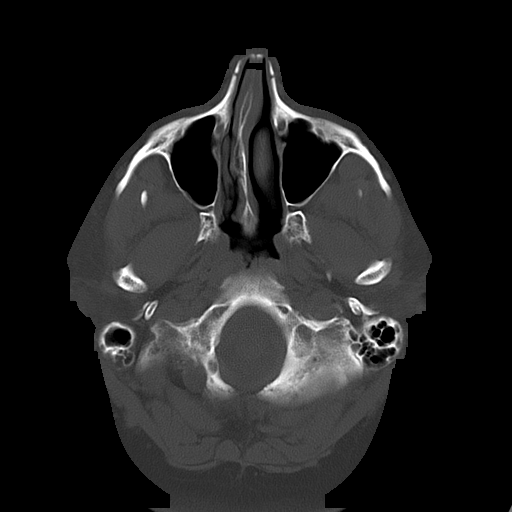
[im 7/29  bone]
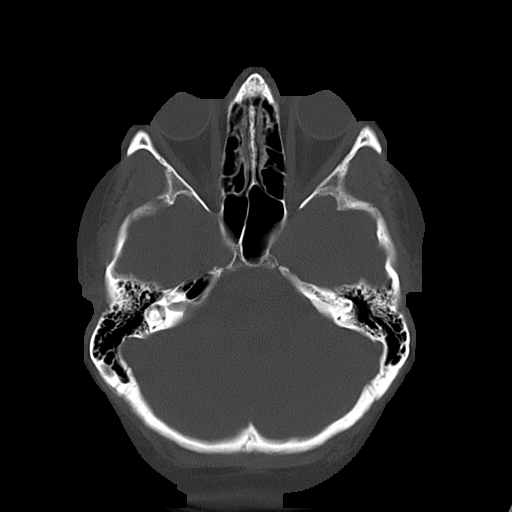
[im 11/29  bone]
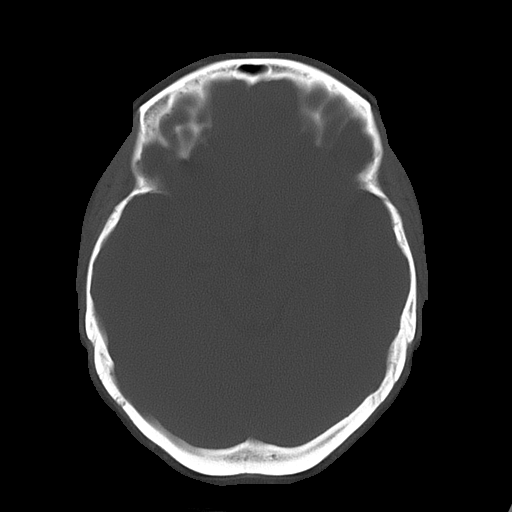

[16 of 30 positions shown; findings below may reference images not displayed]

FINDINGS: There is not evidence of intra-axial fluid collections. There is
no evidence of acute hemorrhage or secondary signs reflecting mass effect or
subacute or chronic focal territorial infarction. The osseous structures
demonstrate no evidence of a depressed skull fracture. If there is
persistent concern clinical follow-up with MRI is recommended.
IMPRESSION: 1. No evidence of acute intracranial abnormalitites.
2. Comparison made to prior study dated 05/26/2009.

## 2012-05-16 IMAGING — CT CT HEAD WITHOUT CONTRAST
2 series · 16 of 30 positions shown, 20 images · non-contrast
Comparison: none

REASON FOR EXAM: head trauma  headache
COMMENTS:

PROCEDURE:     CT  - CT HEAD WITHOUT CONTRAST  - September 05, 2011  [DATE]
RESULT:     Comparison:  None
TECHNIQUE: Multiple axial images from the foramen magnum to the vertex were
obtained without IV contrast.

[Series 2: without · axial · non-contrast · 0.39mm/px · z∈[-144,-19]mm · 13 of 31 slices shown, 17 images]
[im 3/31  brain]
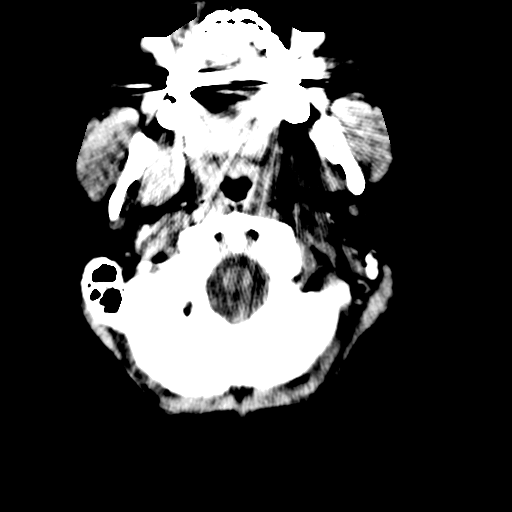
[im 3/31  bone]
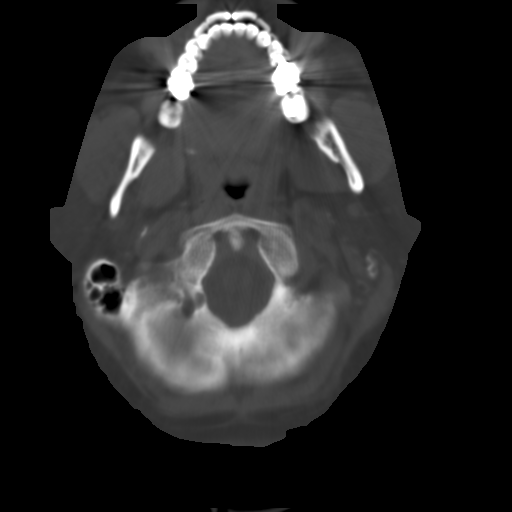
[im 5/31  brain]
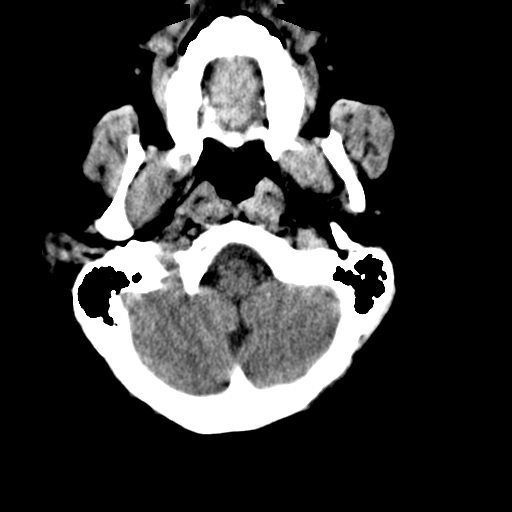
[im 7/31  brain]
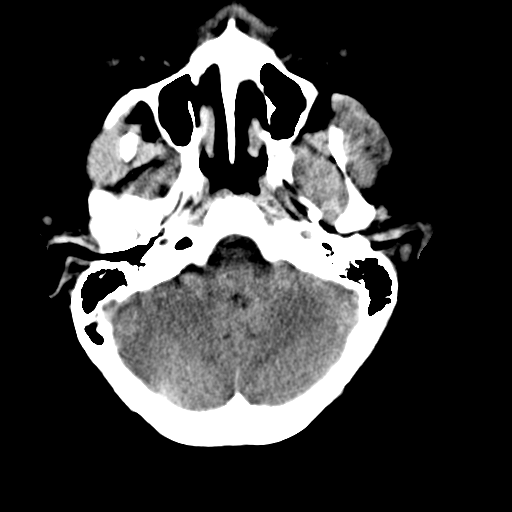
[im 9/31  brain]
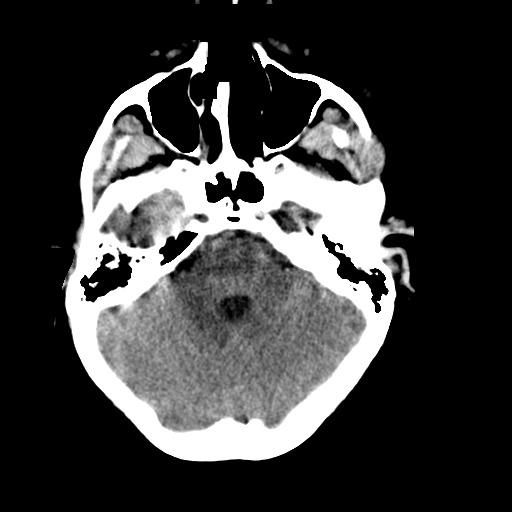
[im 11/31  brain]
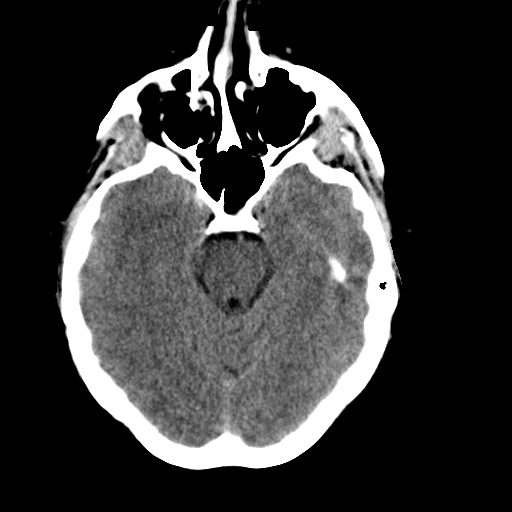
[im 11/31  bone]
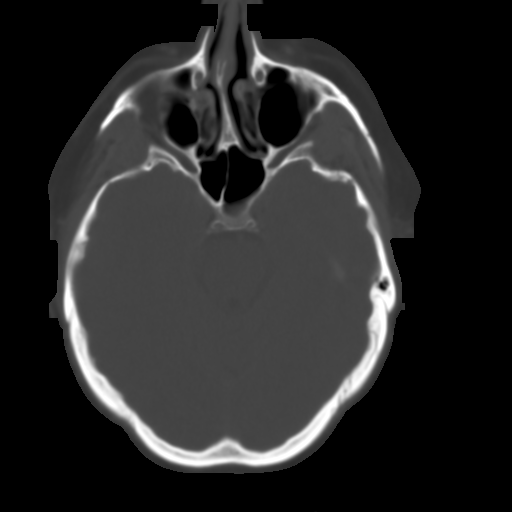
[im 13/31  brain]
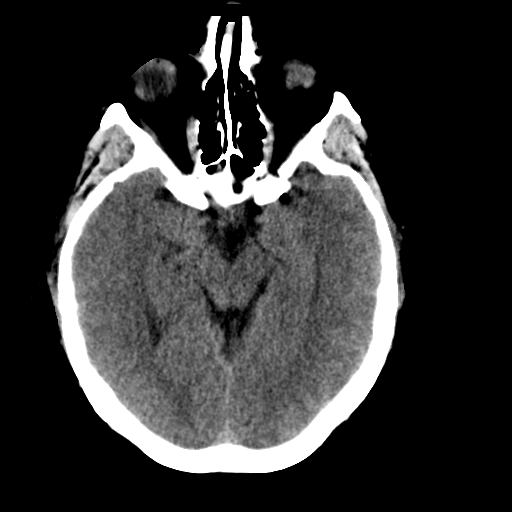
[im 16/31  brain]
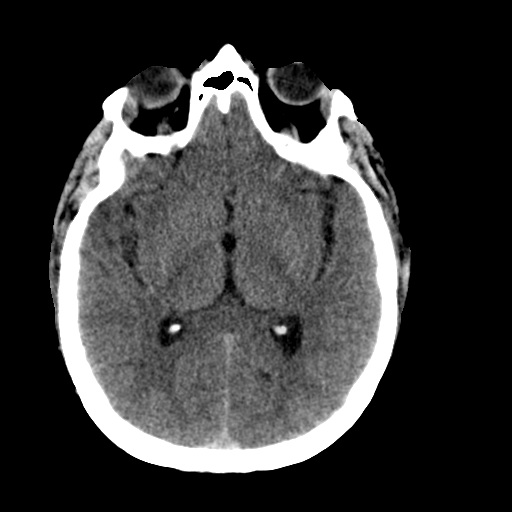
[im 18/31  brain]
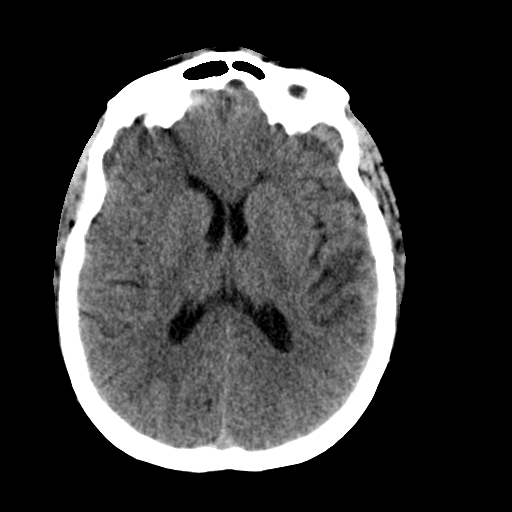
[im 20/31  brain]
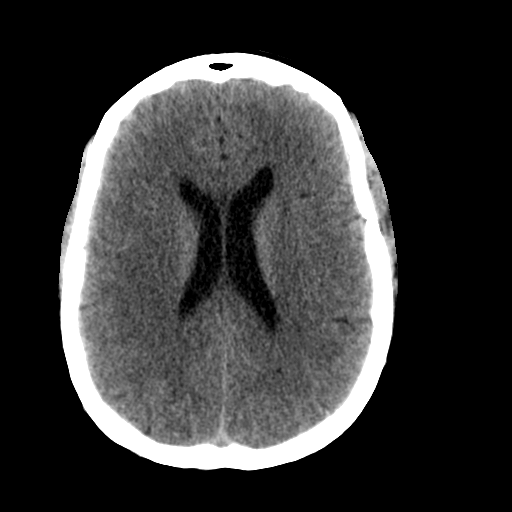
[im 20/31  bone]
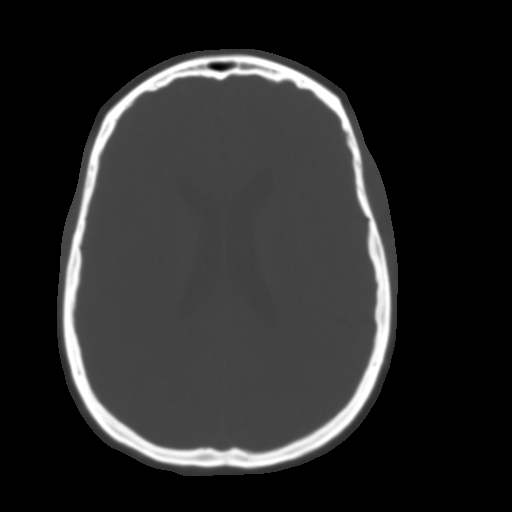
[im 22/31  brain]
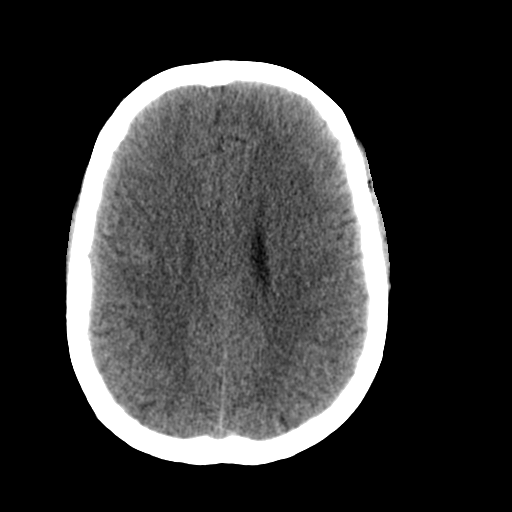
[im 24/31  brain]
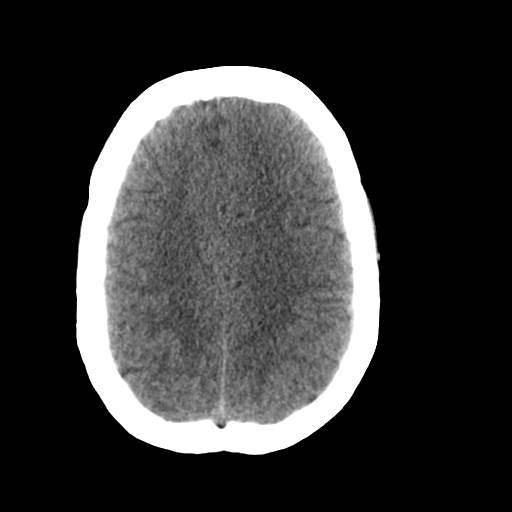
[im 26/31  brain]
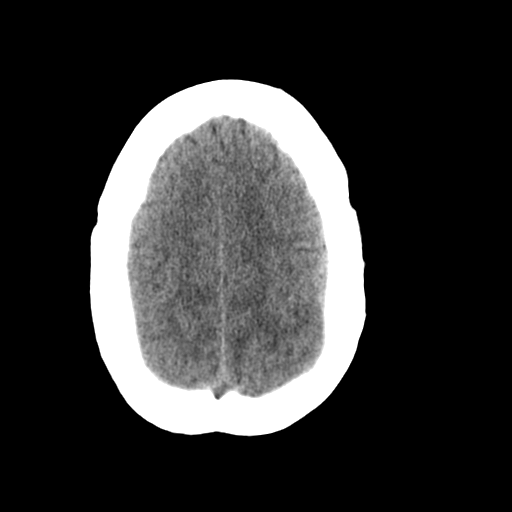
[im 28/31  brain]
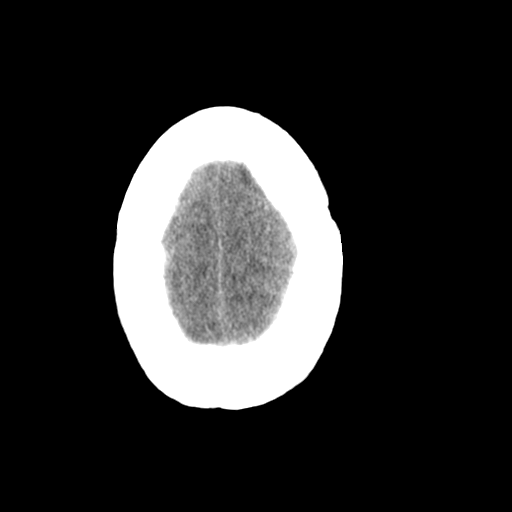
[im 28/31  bone]
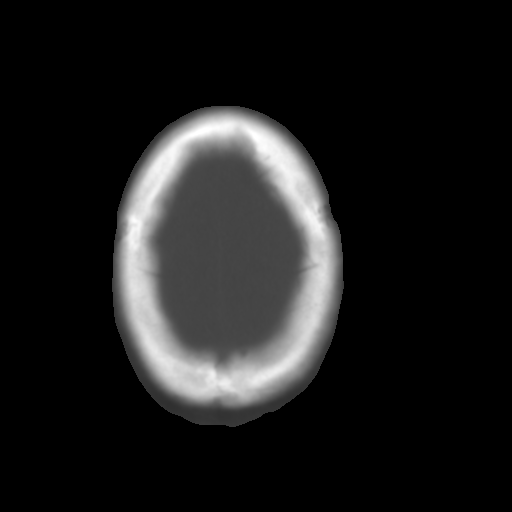

[Series 3: bone · axial · 0.39mm/px · z∈[-144,-104]mm · 3 of 31 slices shown]
[im 3/31  bone]
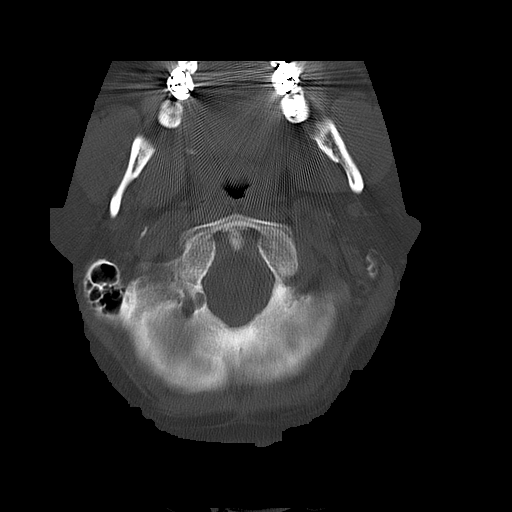
[im 7/31  bone]
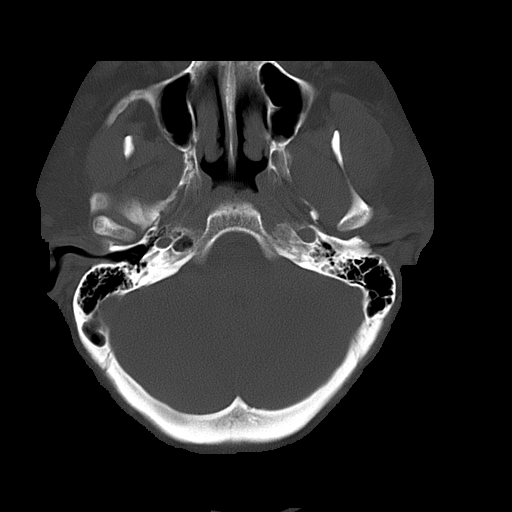
[im 11/31  bone]
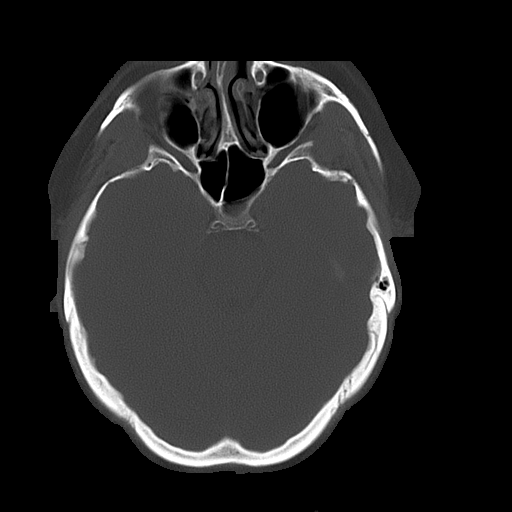

[16 of 30 positions shown; findings below may reference images not displayed]

FINDINGS: There is no evidence of mass effect, midline shift, or extra-axial fluid
collections.  There is no evidence of a space-occupying lesion or
intracranial hemorrhage. There is no evidence of a cortical-based area of
acute infarction.

The ventricles and sulci are appropriate for the patient's age. The basal
cisterns are patent.

Visualized portions of the orbits are unremarkable. The visualized portions
of the paranasal sinuses and mastoid air cells are unremarkable.

The osseous structures are unremarkable.
IMPRESSION: No acute intracranial process.

[REDACTED]

## 2012-05-16 IMAGING — CT CT CHEST-ABD-PELV W/O CM
1 of 2 series · 14 of 29 positions shown, 19 images · non-contrast
Comparison: None

REASON FOR EXAM: (1) chest pain; (2) abd  pain  trauma
COMMENTS:

PROCEDURE:     CT  - CT CHEST ABDOMEN AND PELVIS WO  - September 05, 2011  [DATE]
RESULT:     CT CHEST, ABDOMEN, AND PELVIS
HISTORY: Chest pain
TECHNIQUE: Multiple axial images obtained from the thoracic inlet to the
pubic symphysis, without p.o. contrast and without intravenous contrast.

[Series 2: soft tissue · axial · 0.76mm/px · z∈[-603,-83]mm · 14 of 118 slices shown, 19 images]
[im 7/118  mediastinal]
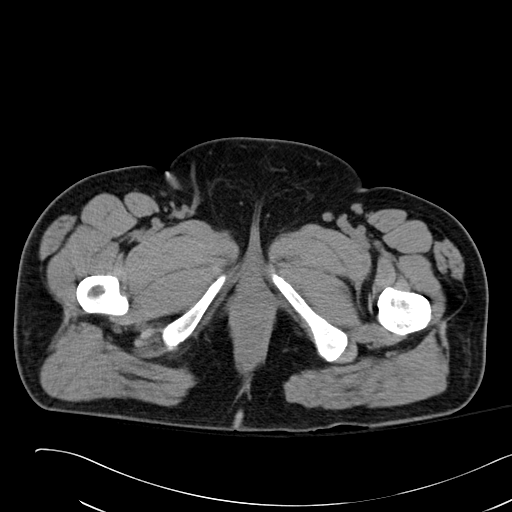
[im 7/118  bone]
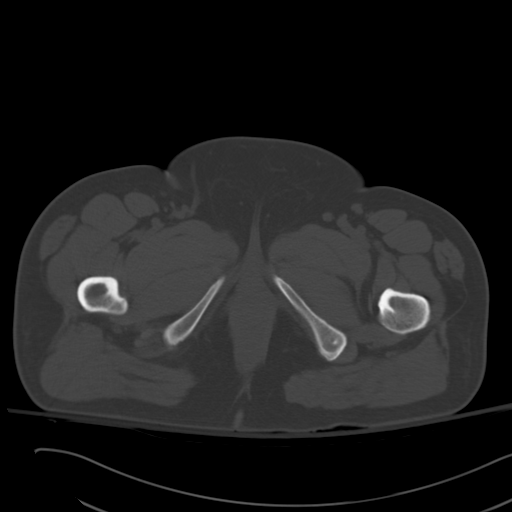
[im 20/118  mediastinal]
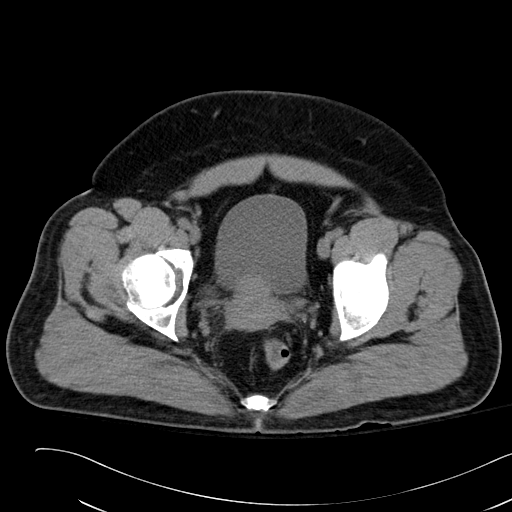
[im 27/118  mediastinal]
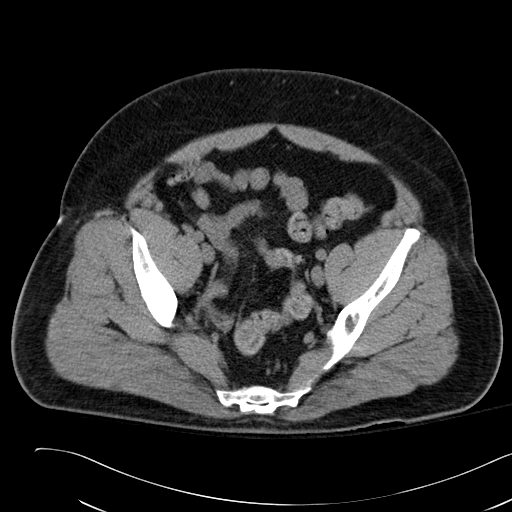
[im 33/118  mediastinal]
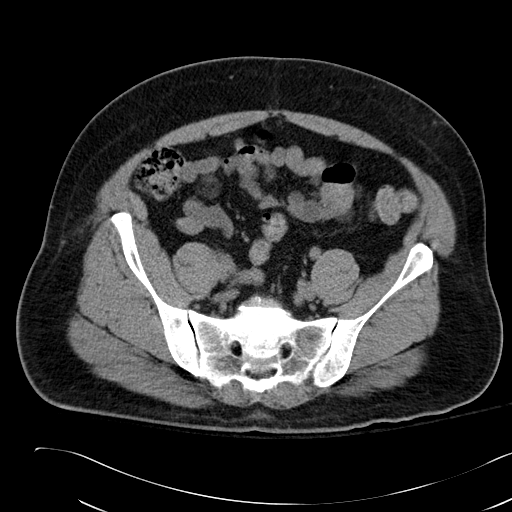
[im 46/118  mediastinal]
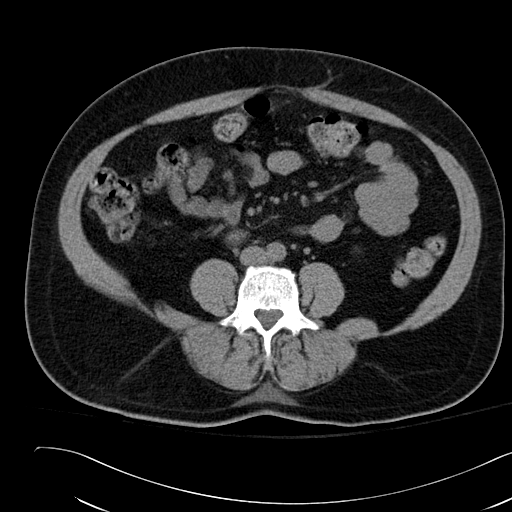
[im 53/118  mediastinal]
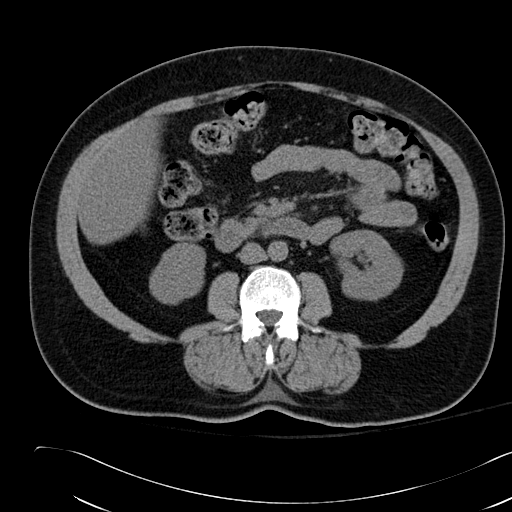
[im 59/118  mediastinal]
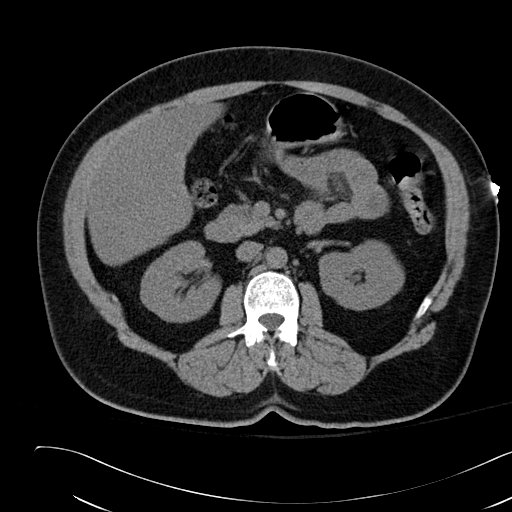
[im 66/118  mediastinal]
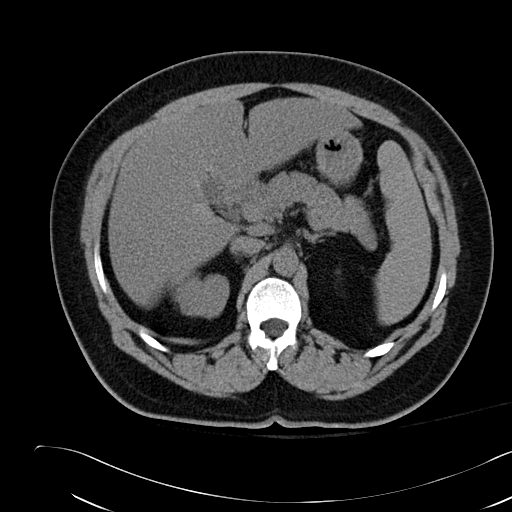
[im 72/118  mediastinal]
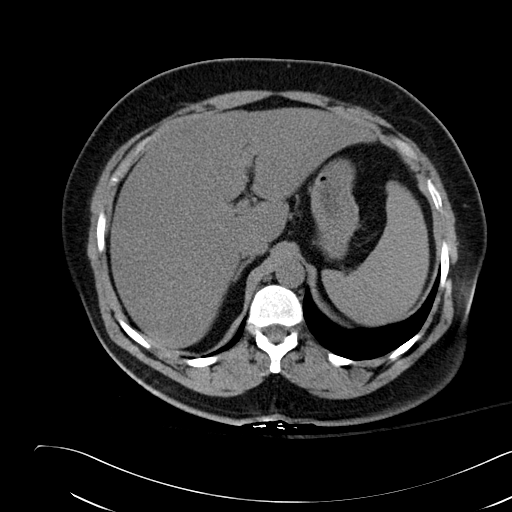
[im 72/118  bone]
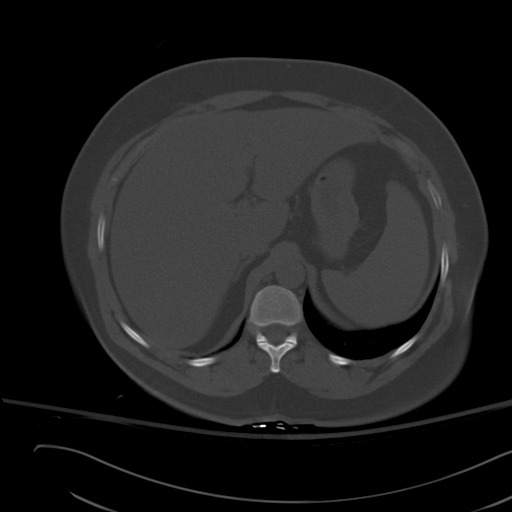
[im 85/118  mediastinal]
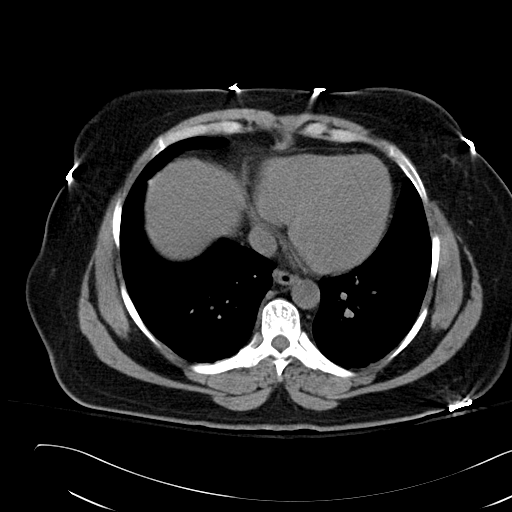
[im 92/118  mediastinal]
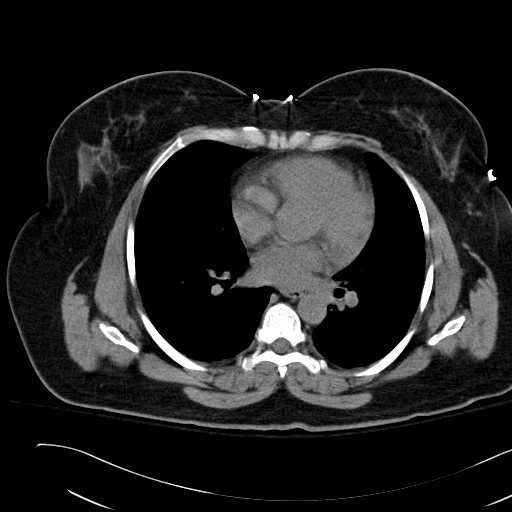
[im 92/118  lung]
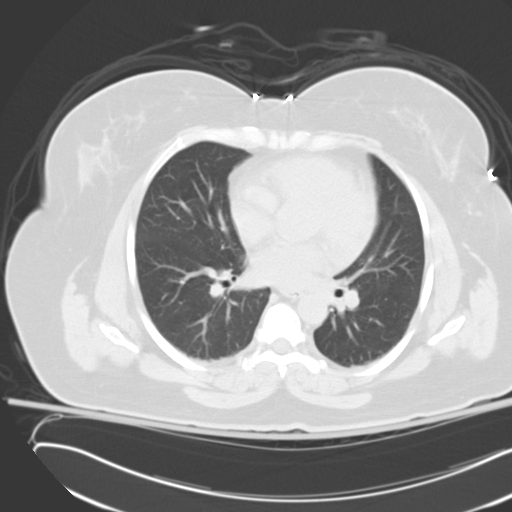
[im 98/118  mediastinal]
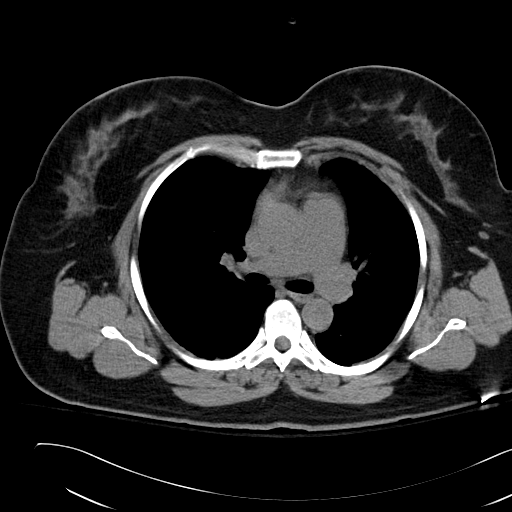
[im 98/118  lung]
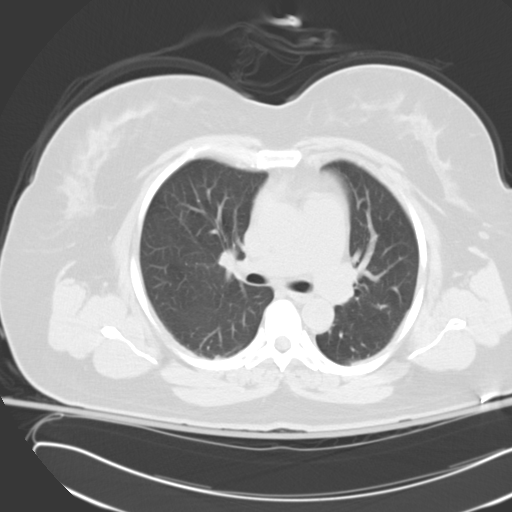
[im 105/118  lung]
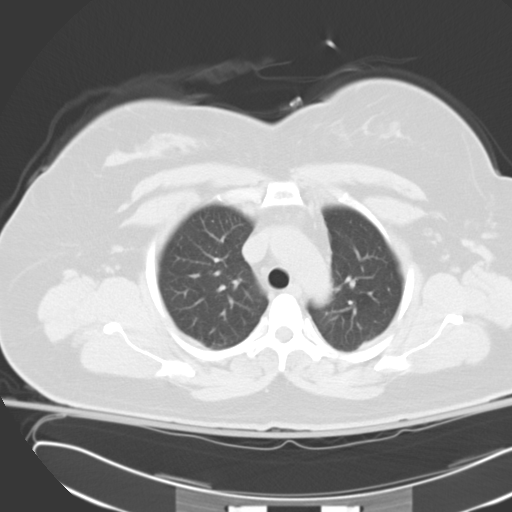
[im 111/118  mediastinal]
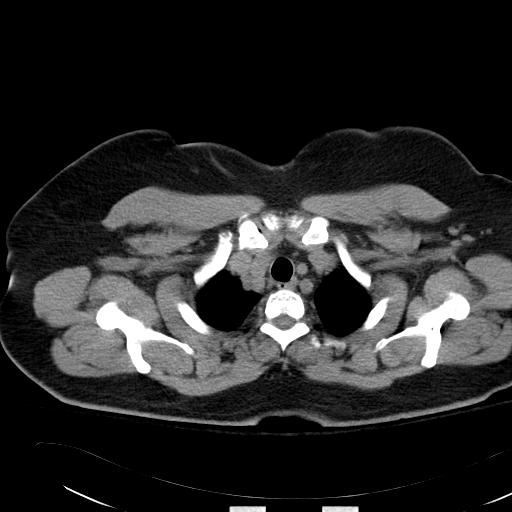
[im 111/118  lung]
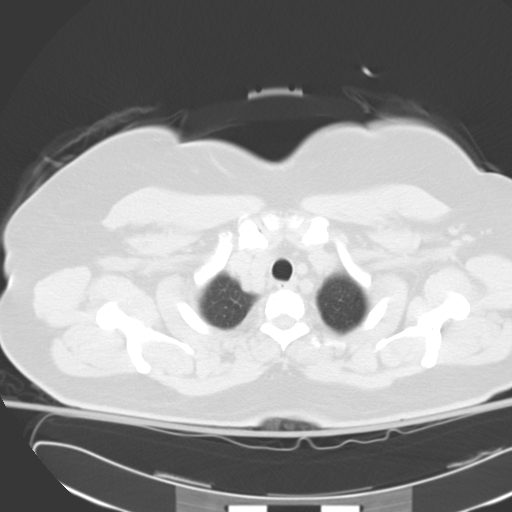

[14 of 29 positions shown; findings below may reference images not displayed]

FINDINGS: CHEST:

The lungs are clear. There is no focal mass. There is no focal parenchymal
opacity, pleural effusion, or pneumothorax.

The heart size is normal. There is no pericardial effusion.

There are no pathologically enlarged mediastinal, hilar, or axillary lymph
nodes.

The osseous structures demonstrate no focal abnormality.

ABDOMEN/PELVIS:

The liver is diffusely low-attenuation likely secondary to hepatic
steatosis. There is no intrahepatic or extrahepatic biliary ductal
dilatation. The gallbladder is unremarkable. The spleen demonstrates no
focal abnormality. The kidneys, adrenal glands, pancreas are normal. The
bladder is unremarkable.

The unopacified stomach, duodenum, small intestine, and large intestine no
focal abnormality, but evaluation is somewhat limited secondary to lack of
enteric contrast. There is no pneumoperitoneum, pneumatosis, or portal
venous gas. There is no abdominal or pelvic free fluid. There is no
lymphadenopathy.

The abdominal aorta is normal in caliber .

The osseous structures are unremarkable.
IMPRESSION: 1. No acute injury of the chest, abdomen or pelvis.

2. Hepatic steatosis.

[REDACTED]

## 2012-06-07 ENCOUNTER — Emergency Department: Payer: Self-pay | Admitting: Unknown Physician Specialty

## 2012-06-08 LAB — COMPREHENSIVE METABOLIC PANEL
Anion Gap: 7 (ref 7–16)
BUN: 12 mg/dL (ref 7–18)
Calcium, Total: 8.5 mg/dL (ref 8.5–10.1)
Chloride: 110 mmol/L — ABNORMAL HIGH (ref 98–107)
Co2: 25 mmol/L (ref 21–32)
Creatinine: 0.8 mg/dL (ref 0.60–1.30)
EGFR (Non-African Amer.): >60
Osmolality: 284 (ref 275–301)
Potassium: 3.6 mmol/L (ref 3.5–5.1)
SGOT(AST): 62 U/L — ABNORMAL HIGH (ref 15–37)
Sodium: 142 mmol/L (ref 136–145)
Total Protein: 7.4 g/dL (ref 6.4–8.2)

## 2012-06-08 LAB — CBC
HGB: 12.4 g/dL (ref 12.0–16.0)
MCH: 29.4 pg (ref 26.0–34.0)
MCHC: 34.1 g/dL (ref 32.0–36.0)
WBC: 4.6 10*3/uL (ref 3.6–11.0)

## 2012-06-08 LAB — SEDIMENTATION RATE: Erythrocyte Sed Rate: 20 mm/hr (ref 0–20)

## 2012-06-18 ENCOUNTER — Ambulatory Visit: Payer: Self-pay | Admitting: Family Medicine

## 2012-06-18 LAB — CBC WITH DIFFERENTIAL/PLATELET
Basophil #: 0.1 10*3/uL (ref 0.0–0.1)
HGB: 12.8 g/dL (ref 12.0–16.0)
Lymphocyte #: 1.6 10*3/uL (ref 1.0–3.6)
MCHC: 33.7 g/dL (ref 32.0–36.0)
MCV: 86 fL (ref 80–100)
Monocyte %: 7 %
Neutrophil #: 7.7 10*3/uL — ABNORMAL HIGH (ref 1.4–6.5)
Neutrophil %: 75.4 %
Platelet: 143 10*3/uL — ABNORMAL LOW (ref 150–440)
RBC: 4.44 10*6/uL (ref 3.80–5.20)
RDW: 14.2 % (ref 11.5–14.5)
WBC: 10.3 10*3/uL (ref 3.6–11.0)

## 2012-06-18 LAB — RAPID INFLUENZA A&B ANTIGENS

## 2012-06-18 LAB — RAPID STREP-A WITH REFLX: Micro Text Report: NEGATIVE

## 2012-09-11 ENCOUNTER — Ambulatory Visit: Payer: Self-pay | Admitting: Internal Medicine

## 2012-09-11 LAB — COMPREHENSIVE METABOLIC PANEL
Albumin: 3.6 g/dL (ref 3.4–5.0)
Alkaline Phosphatase: 139 U/L — ABNORMAL HIGH (ref 50–136)
Anion Gap: 9 (ref 7–16)
Bilirubin,Total: 0.4 mg/dL (ref 0.2–1.0)
Chloride: 106 mmol/L (ref 98–107)
Co2: 26 mmol/L (ref 21–32)
EGFR (African American): >60
EGFR (Non-African Amer.): >60
Osmolality: 282 (ref 275–301)
SGOT(AST): 76 U/L — ABNORMAL HIGH (ref 15–37)
SGPT (ALT): 80 U/L — ABNORMAL HIGH (ref 12–78)
Sodium: 141 mmol/L (ref 136–145)
Total Protein: 7.4 g/dL (ref 6.4–8.2)

## 2012-09-11 LAB — LIPASE, BLOOD: Lipase: 326 U/L (ref 73–393)

## 2012-09-11 LAB — CBC WITH DIFFERENTIAL/PLATELET
Basophil #: 0 10*3/uL (ref 0.0–0.1)
Eosinophil %: 4.6 %
HCT: 39.8 % (ref 35.0–47.0)
HGB: 12.9 g/dL (ref 12.0–16.0)
MCH: 27.5 pg (ref 26.0–34.0)
MCV: 85 fL (ref 80–100)
Monocyte #: 0.3 x10 3/mm (ref 0.2–0.9)
Monocyte %: 6.1 %
Neutrophil #: 2.8 10*3/uL (ref 1.4–6.5)
Platelet: 140 10*3/uL — ABNORMAL LOW (ref 150–440)
RBC: 4.7 10*6/uL (ref 3.80–5.20)
WBC: 5.5 10*3/uL (ref 3.6–11.0)

## 2012-09-11 LAB — SEDIMENTATION RATE: Erythrocyte Sed Rate: 20 mm/hr (ref 0–20)

## 2012-09-11 LAB — URINALYSIS, COMPLETE
Bacteria: NEGATIVE
Bilirubin,UR: NEGATIVE
Blood: NEGATIVE
Glucose,UR: NEGATIVE mg/dL (ref 0–75)
Ketone: NEGATIVE
Nitrite: NEGATIVE
Ph: 6 (ref 4.5–8.0)

## 2012-09-11 LAB — AMYLASE: Amylase: 68 U/L (ref 25–115)

## 2012-09-13 LAB — URINE CULTURE

## 2012-10-08 ENCOUNTER — Ambulatory Visit: Payer: Self-pay | Admitting: Pediatrics

## 2012-10-16 ENCOUNTER — Emergency Department: Payer: Self-pay | Admitting: Emergency Medicine

## 2012-10-16 LAB — COMPREHENSIVE METABOLIC PANEL
Albumin: 3.7 g/dL (ref 3.4–5.0)
Anion Gap: 4 — ABNORMAL LOW (ref 7–16)
BUN: 14 mg/dL (ref 7–18)
Calcium, Total: 8.9 mg/dL (ref 8.5–10.1)
Co2: 26 mmol/L (ref 21–32)
EGFR (Non-African Amer.): >60
Glucose: 127 mg/dL — ABNORMAL HIGH (ref 65–99)
Osmolality: 281 (ref 275–301)
SGOT(AST): 78 U/L — ABNORMAL HIGH (ref 15–37)
SGPT (ALT): 81 U/L — ABNORMAL HIGH (ref 12–78)
Sodium: 140 mmol/L (ref 136–145)
Total Protein: 7.9 g/dL (ref 6.4–8.2)

## 2012-10-16 LAB — URINALYSIS, COMPLETE
Bacteria: NONE SEEN
Bilirubin,UR: NEGATIVE
Blood: NEGATIVE
Glucose,UR: NEGATIVE mg/dL (ref 0–75)
Ketone: NEGATIVE
Leukocyte Esterase: NEGATIVE
Nitrite: NEGATIVE
Ph: 7 (ref 4.5–8.0)
WBC UR: 1 /HPF (ref 0–5)

## 2012-10-16 LAB — LIPASE, BLOOD: Lipase: 208 U/L (ref 73–393)

## 2012-10-16 LAB — CBC
MCH: 28.2 pg (ref 26.0–34.0)
Platelet: 165 10*3/uL (ref 150–440)
RDW: 14.4 % (ref 11.5–14.5)
WBC: 6.9 10*3/uL (ref 3.6–11.0)

## 2012-10-17 LAB — PREGNANCY, URINE: Pregnancy Test, Urine: NEGATIVE m[IU]/mL

## 2012-10-24 IMAGING — CR DG FOOT COMPLETE 3+V*L*
1 series · 4 of 4 positions shown · non-contrast
Comparison: none

REASON FOR EXAM: pain
COMMENTS:

PROCEDURE:     MDR - MDR FOOT LT COMP W/OBLQUES  - February 13, 2012 [DATE]
RESULT:     Comparison:  None

[Series 1: ap · 0.17mm/px · 4 of 4 slices shown]
[im 1/4]
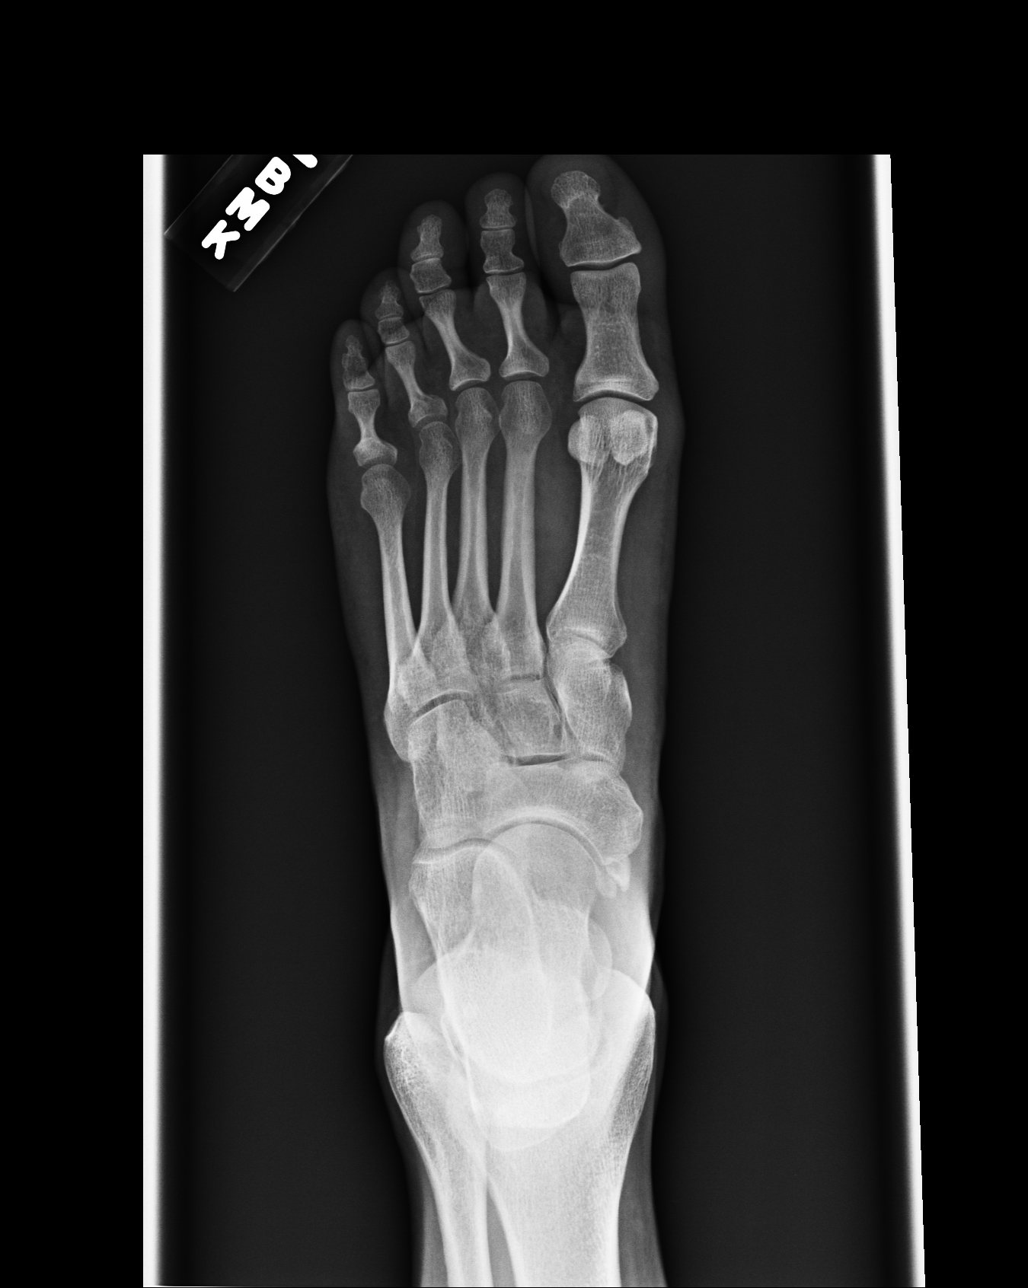
[im 2/4]
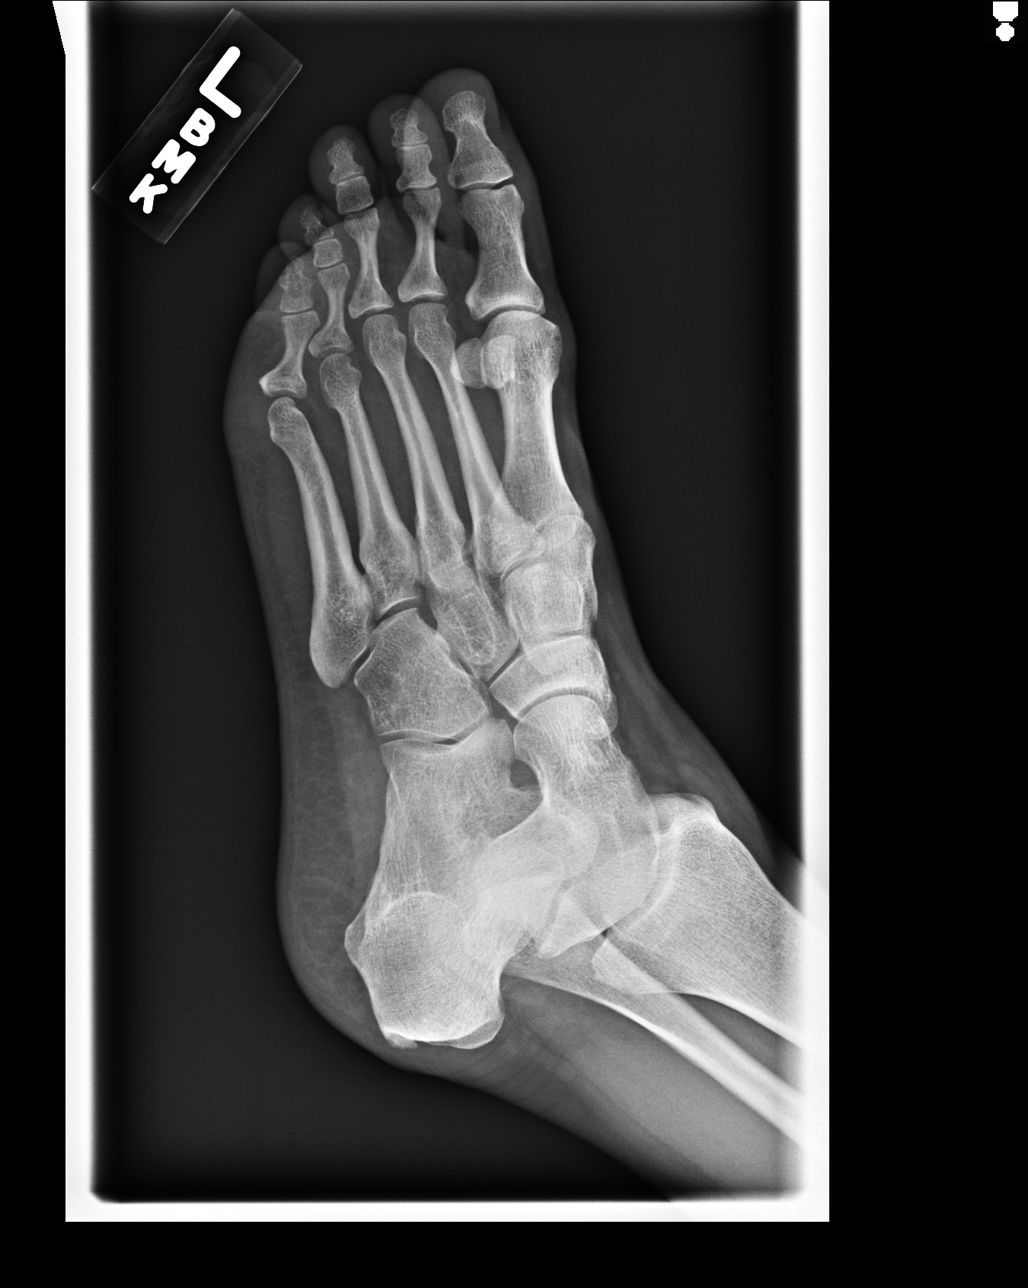
[im 3/4]
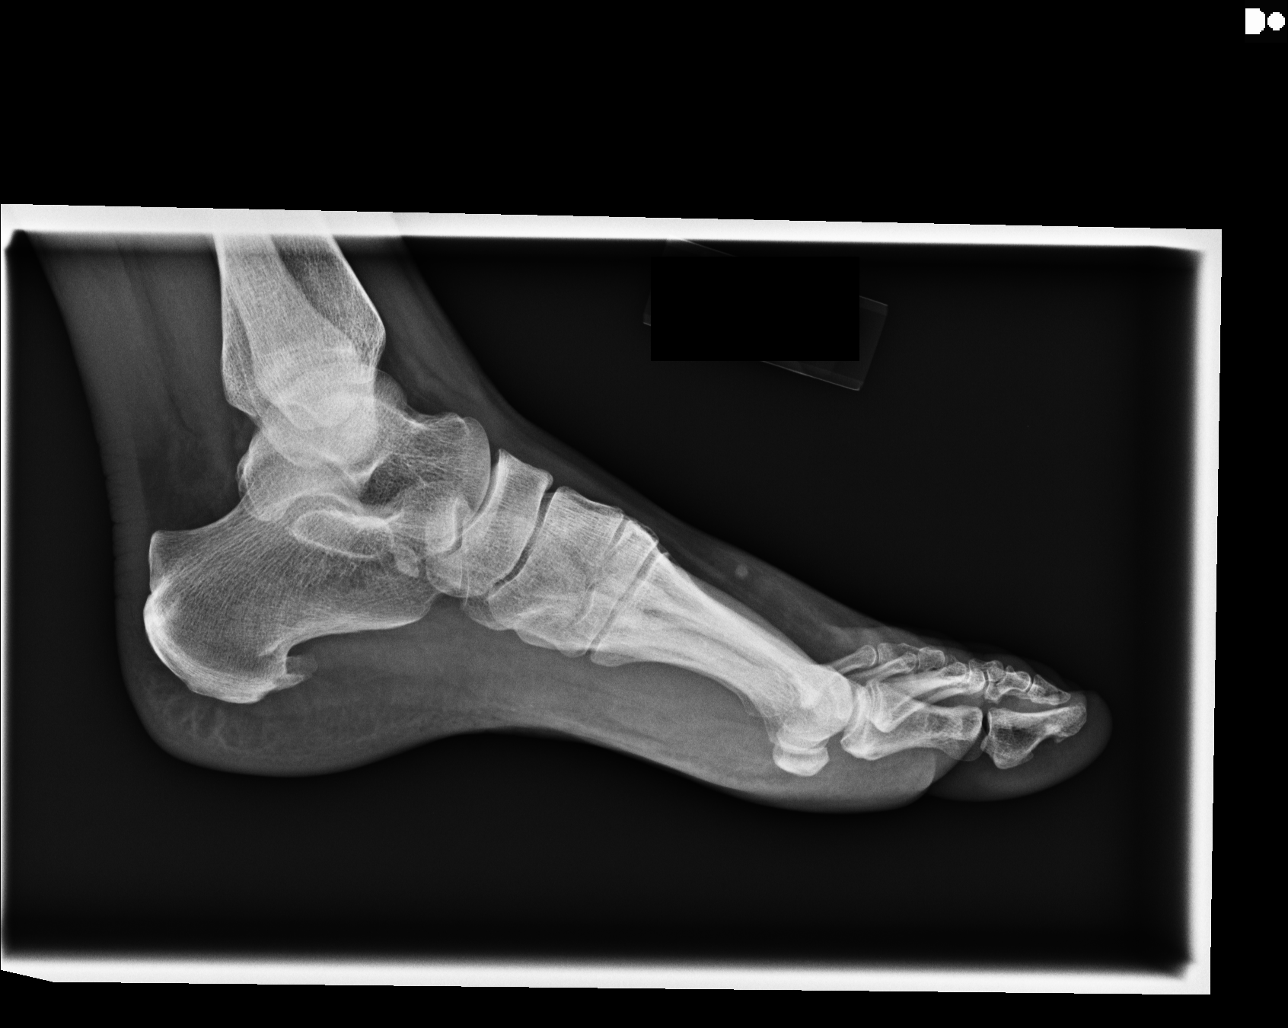
[im 4/4]
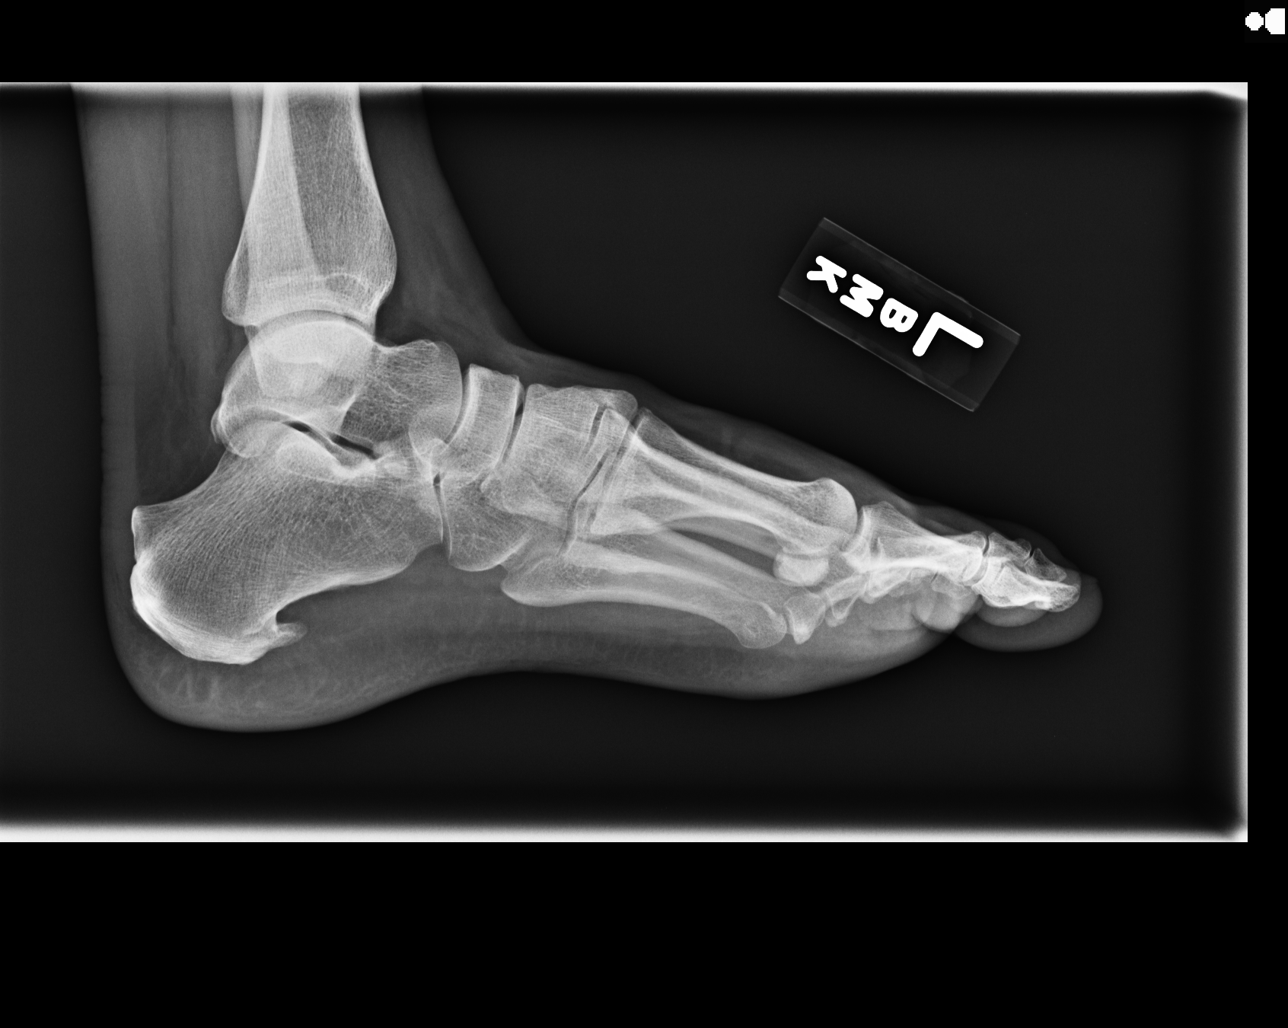

[4 of 4 positions shown; findings below may reference images not displayed]

FINDINGS: AP, oblique, and lateral views of the left foot demonstrates no fracture or
dislocation. Incidental note is made of an os naviculare. There is a plantar
calcaneal spur. There is no soft tissue abnormality. There is no
subcutaneous emphysema or radiopaque foreign bodies.
IMPRESSION: No acute osseous injury of the left foot.

[REDACTED]

## 2012-11-22 ENCOUNTER — Ambulatory Visit: Payer: Self-pay | Admitting: Emergency Medicine

## 2012-12-16 ENCOUNTER — Ambulatory Visit: Payer: Self-pay | Admitting: Pediatrics

## 2013-02-17 IMAGING — CT CT CERVICAL SPINE WITHOUT CONTRAST
1 series · 12 of 14 positions shown, 15 images · non-contrast
Comparison: none

REASON FOR EXAM: neck pain injury
COMMENTS:

[Series 6: axial · axial · 0.33mm/px · z∈[-351,-209]mm · 12 of 92 slices shown, 15 images]
[im 8/92  soft-tissue]
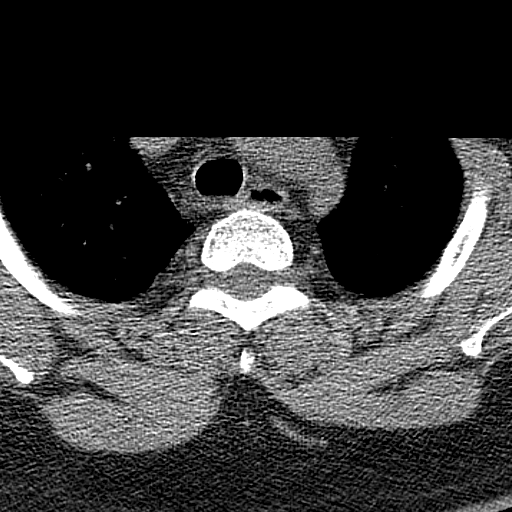
[im 8/92  bone]
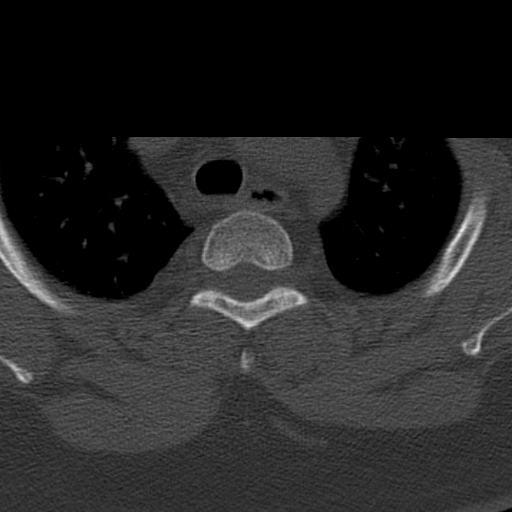
[im 15/92  bone]
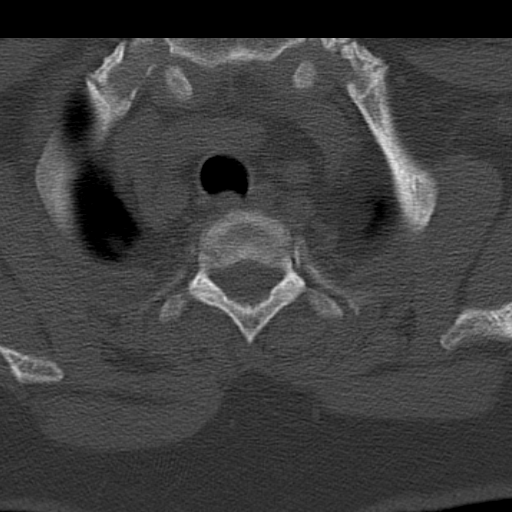
[im 22/92  bone]
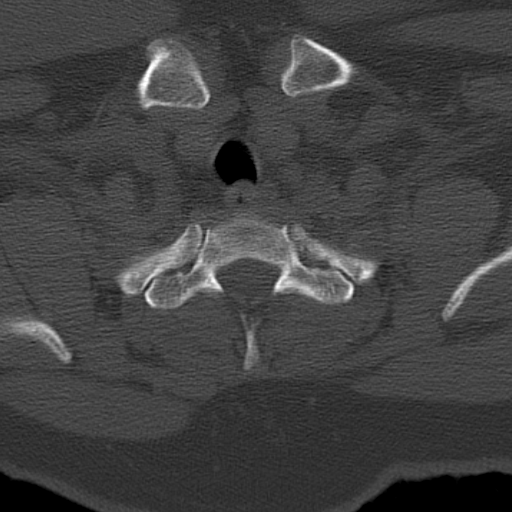
[im 29/92  bone]
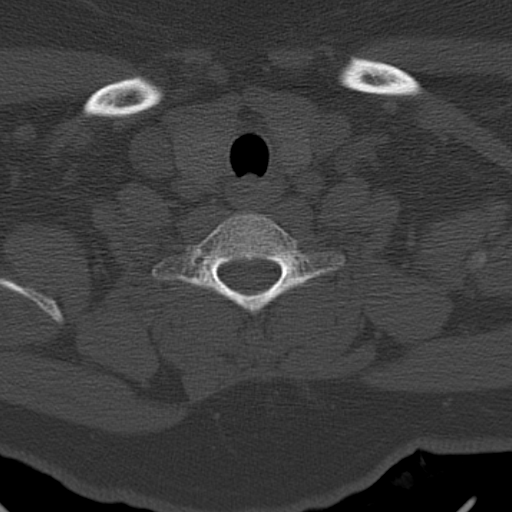
[im 36/92  soft-tissue]
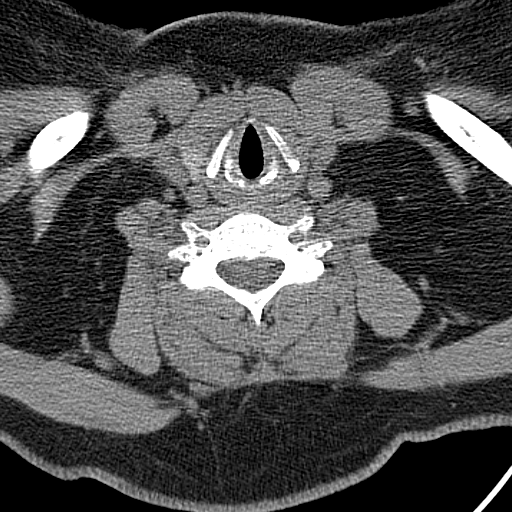
[im 36/92  bone]
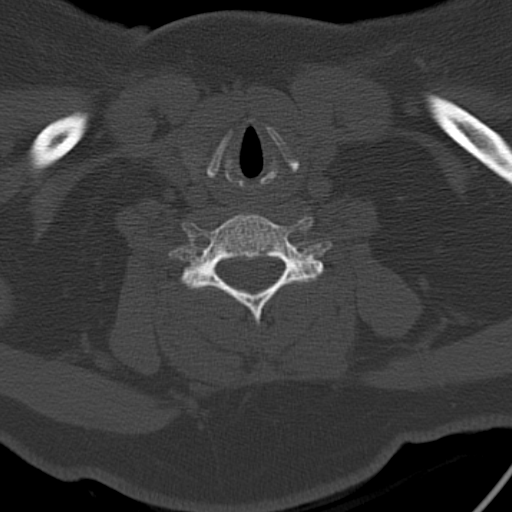
[im 43/92  bone]
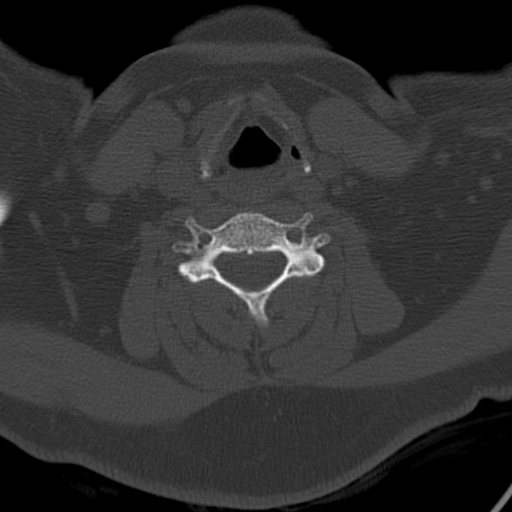
[im 50/92  bone]
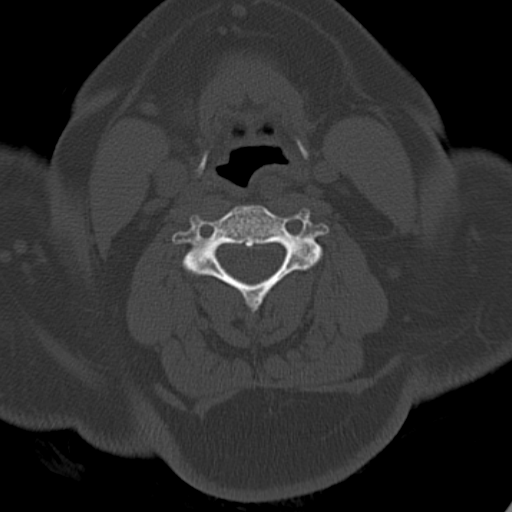
[im 57/92  bone]
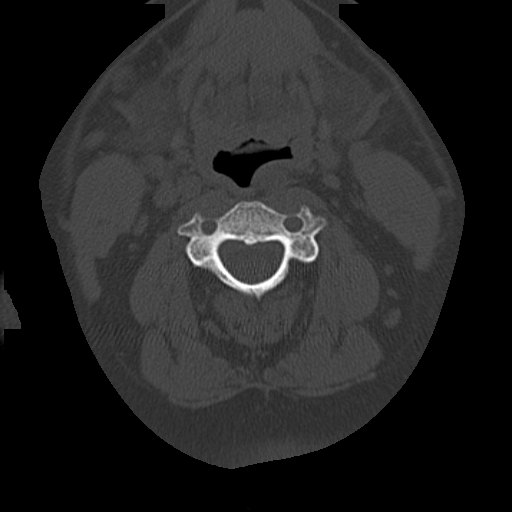
[im 64/92  soft-tissue]
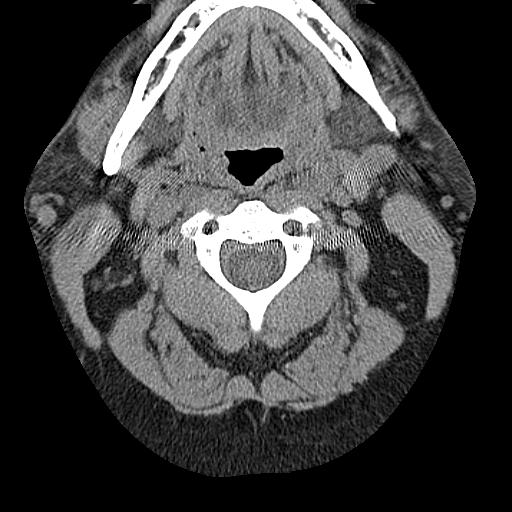
[im 64/92  bone]
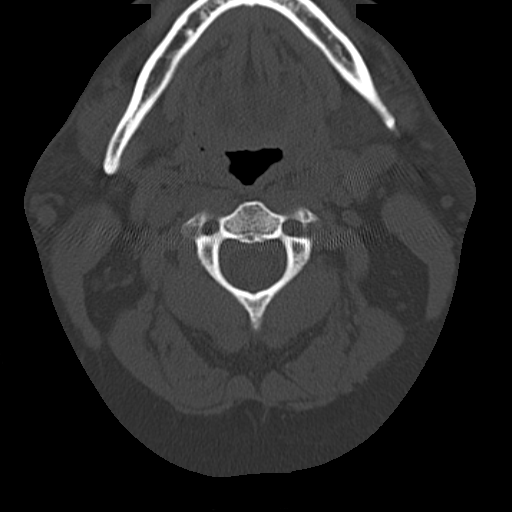
[im 71/92  bone]
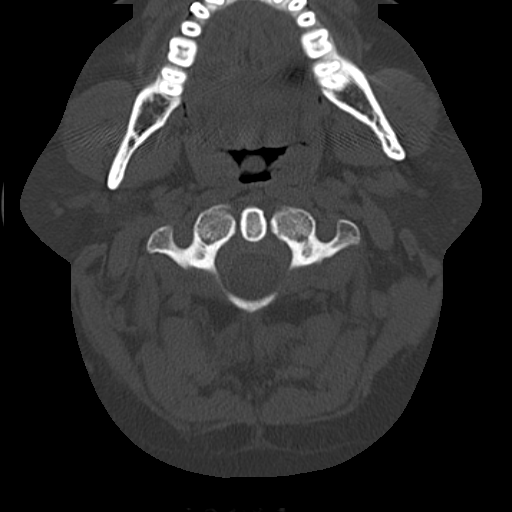
[im 78/92  bone]
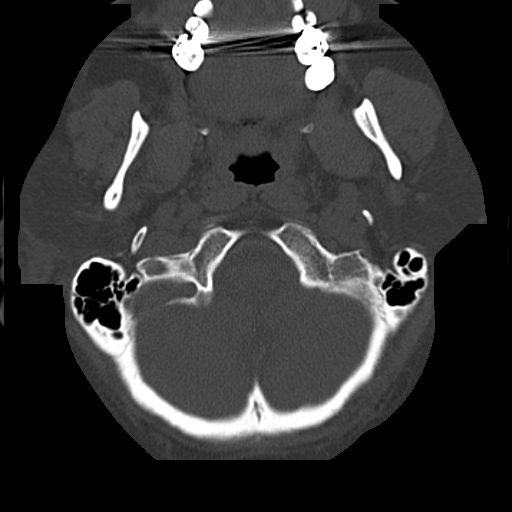
[im 85/92  bone]
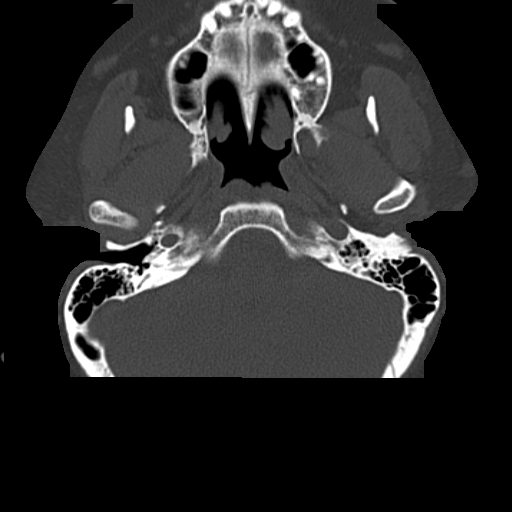

[12 of 14 positions shown; findings below may reference images not displayed]

PROCEDURE:     CT  - CT CERVICAL SPINE WO  - June 08, 2012  [DATE]

RESULT:     Is multislice helical acquisition through the cervical spine is
reconstructed at bone window settings in the axial, coronal and sagittal
planes at 2 mm slice thickness. Comparison is made images dated 05 September, 2011.

There is loss of the normal cervical lordosis with the alignment otherwise
being unremarkable. The craniocervical junction, atlantoaxial alignment and
prevertebral soft tissues appear normal. No spinal fracture is evident in
the cervical spine. Spinous processes and facets appear to be unremarkable.
There is no evidence of bony destruction.
IMPRESSION: 1. No acute cervical spine bony abnormality demonstrated.

[REDACTED]

## 2013-03-30 ENCOUNTER — Ambulatory Visit: Payer: Self-pay

## 2013-03-31 IMAGING — CR DG ELBOW COMPLETE 3+V*L*
1 series · 4 of 4 positions shown · non-contrast
Comparison: none

REASON FOR EXAM: trauma
COMMENTS:

PROCEDURE:     DXR - DXR ELBOW LT COMP W/OBLIQUES  - May 27, 2011 [DATE]
RESULT:     Comparison:  None

[Series 1: x elbow ap left · 0.14mm/px · 4 of 4 slices shown]
[im 1/4]
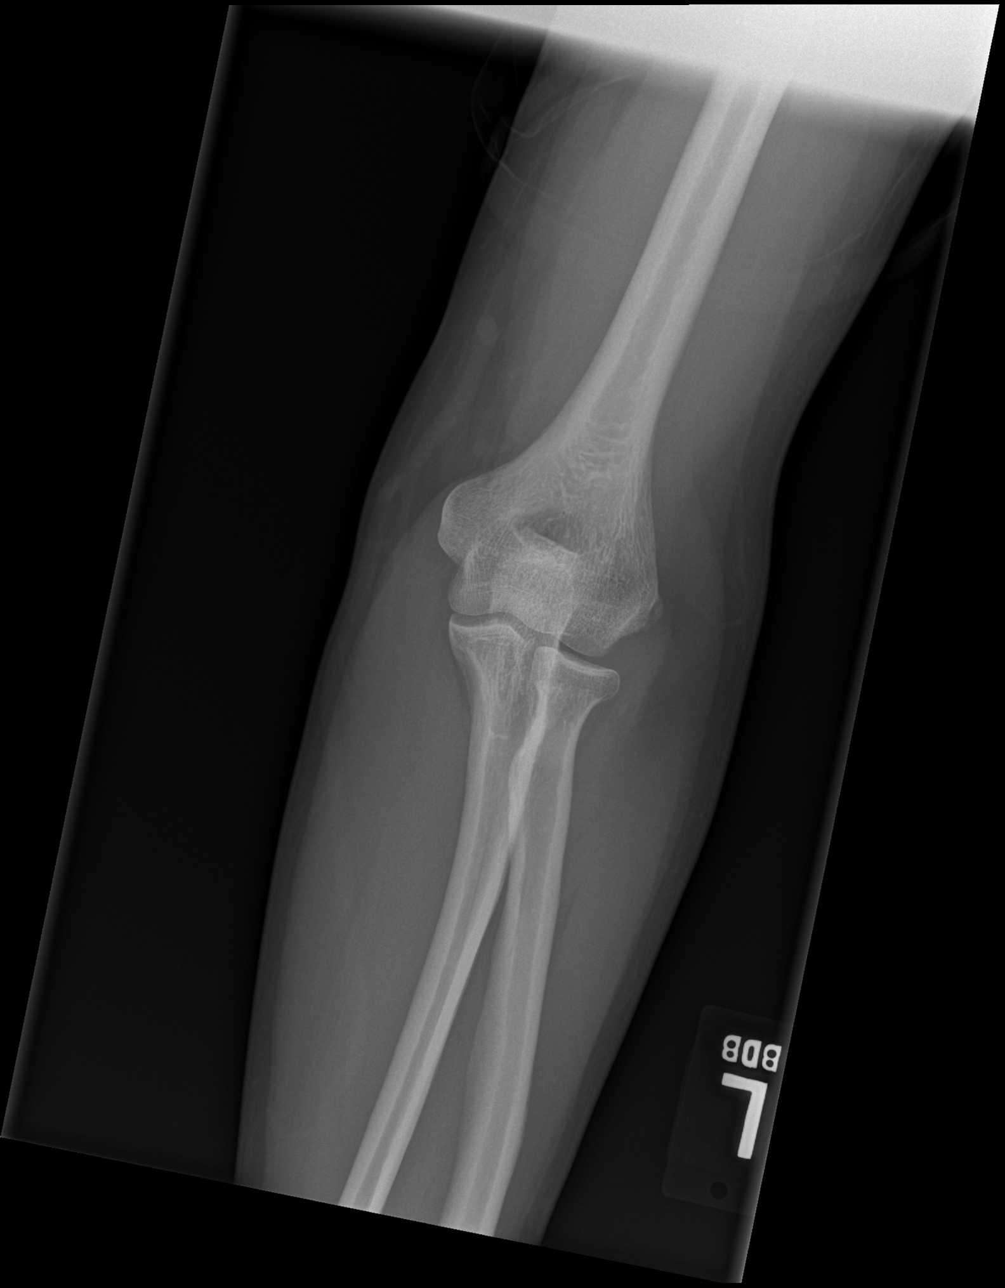
[im 2/4]
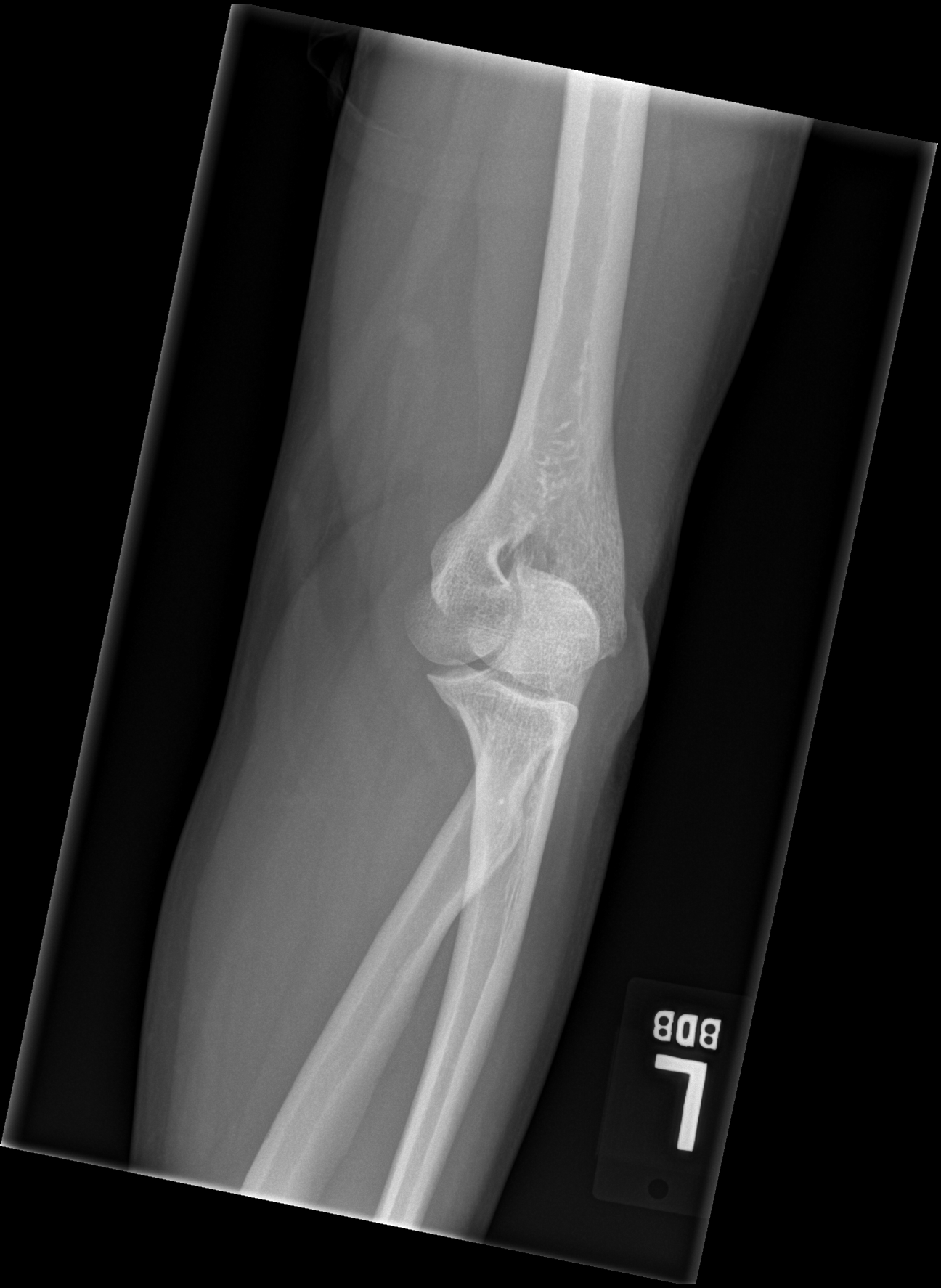
[im 3/4]
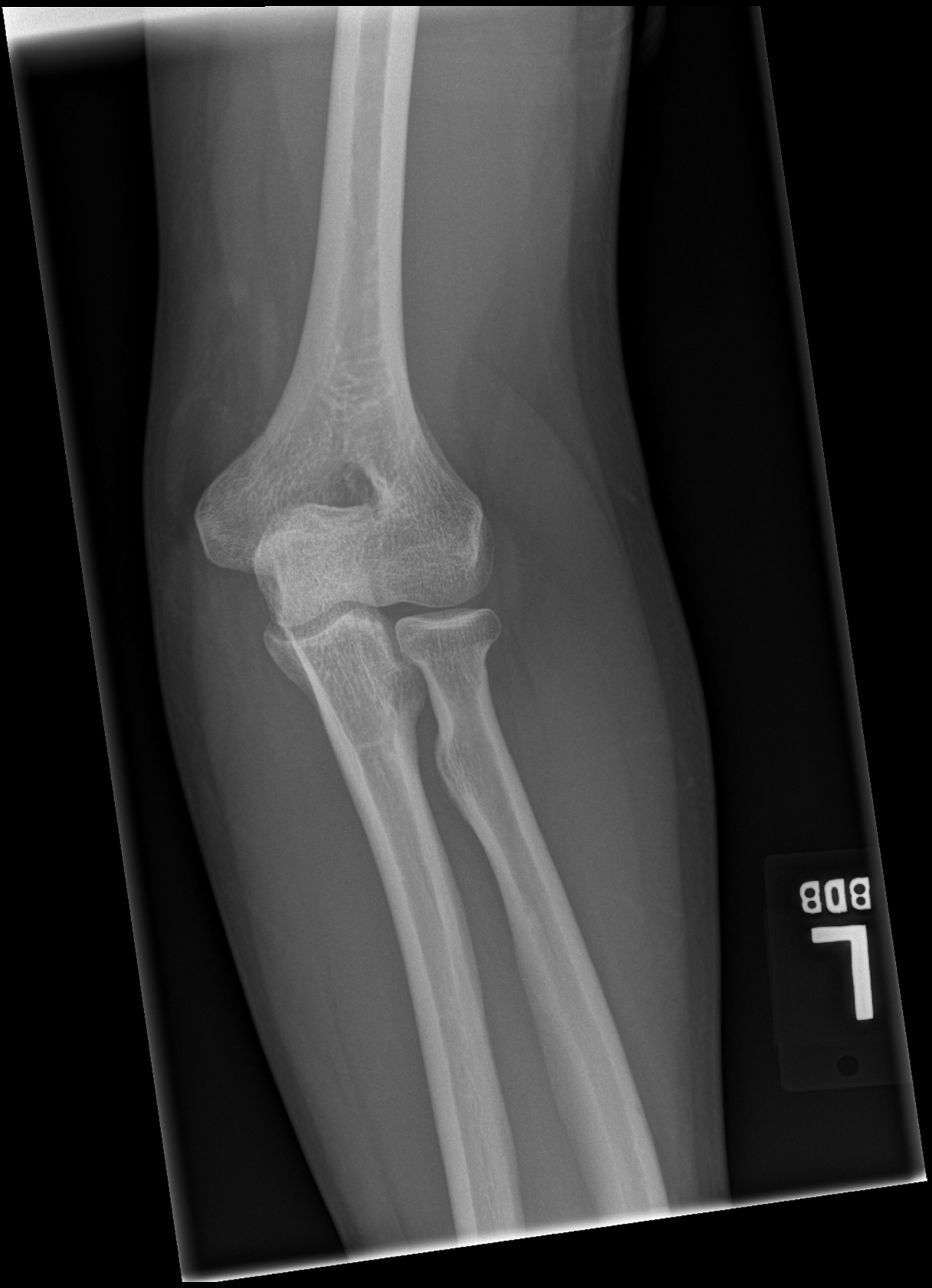
[im 4/4]
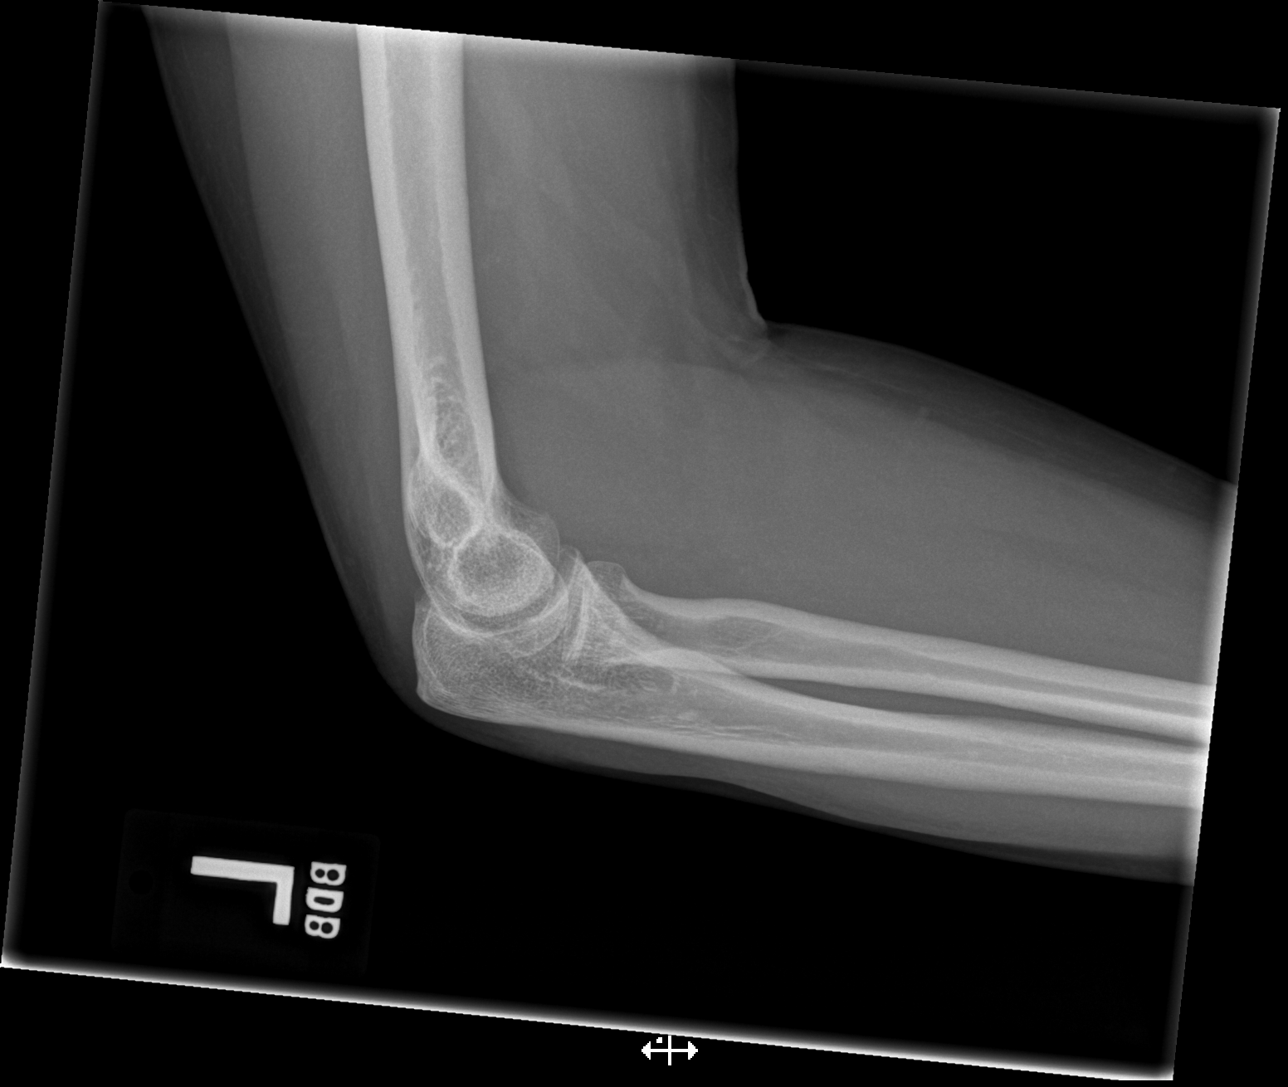

[4 of 4 positions shown; findings below may reference images not displayed]

FINDINGS: 4 views of the left elbow demonstrates no fracture or dislocation. There is
no significant joint effusion. The soft tissues are normal.
IMPRESSION: No acute osseous injury of the left elbow.

## 2013-04-20 ENCOUNTER — Ambulatory Visit: Payer: Self-pay | Admitting: Physician Assistant

## 2013-04-27 ENCOUNTER — Ambulatory Visit: Payer: Self-pay | Admitting: Physician Assistant

## 2013-05-17 ENCOUNTER — Ambulatory Visit: Payer: Self-pay

## 2013-05-17 LAB — RAPID INFLUENZA A&B ANTIGENS (ARMC ONLY)

## 2013-05-17 LAB — RAPID STREP-A WITH REFLX: Micro Text Report: NEGATIVE

## 2013-05-20 LAB — BETA STREP CULTURE(ARMC)

## 2013-06-15 IMAGING — CT CT CERVICAL SPINE WITHOUT CONTRAST
1 series · 12 of 14 positions shown, 15 images · non-contrast
Comparison: none

REASON FOR EXAM: trauma
COMMENTS:

[Series 6: axial · axial · 0.33mm/px · z∈[-267,-135]mm · 12 of 80 slices shown, 15 images]
[im 7/80  soft-tissue]
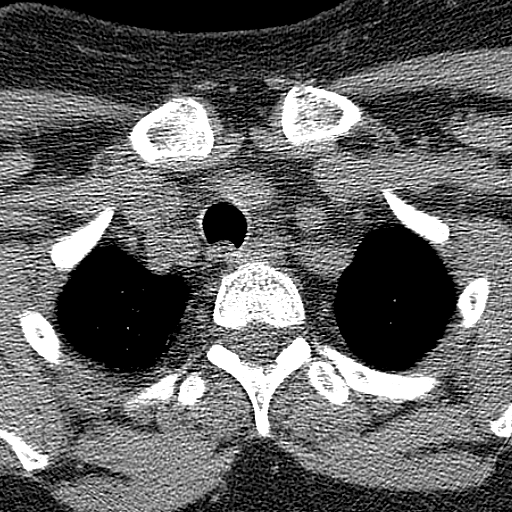
[im 7/80  bone]
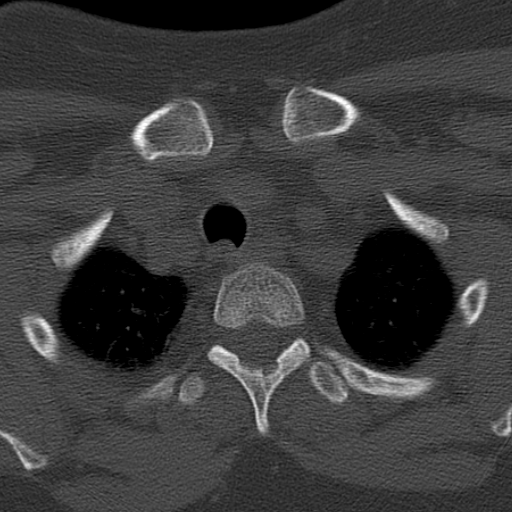
[im 13/80  bone]
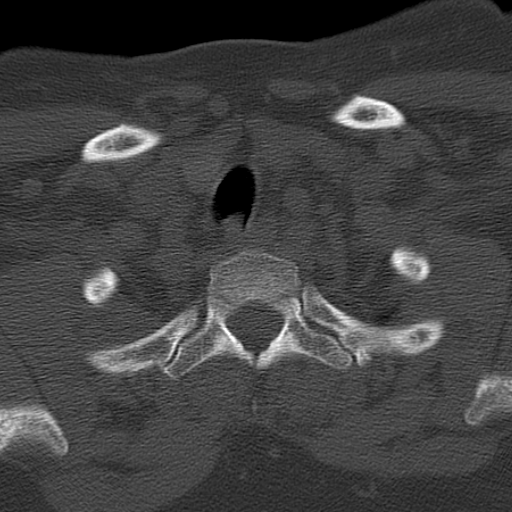
[im 19/80  bone]
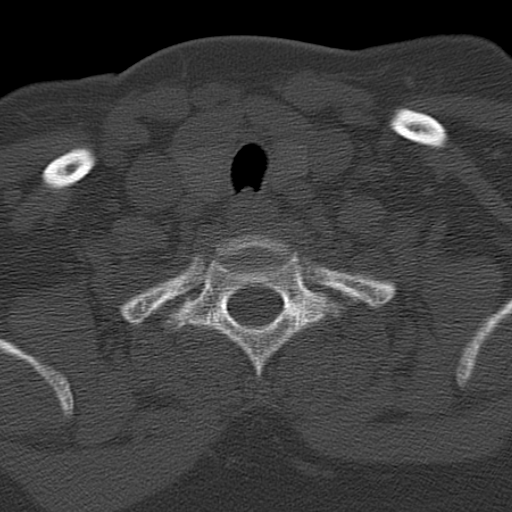
[im 25/80  bone]
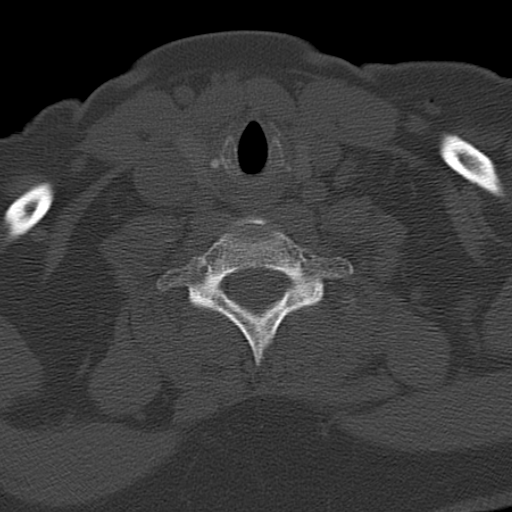
[im 31/80  soft-tissue]
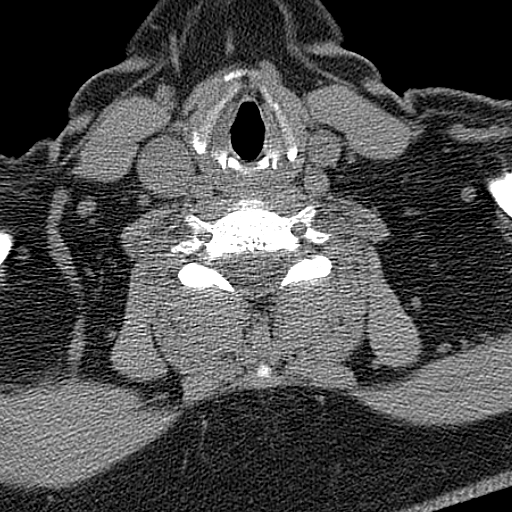
[im 31/80  bone]
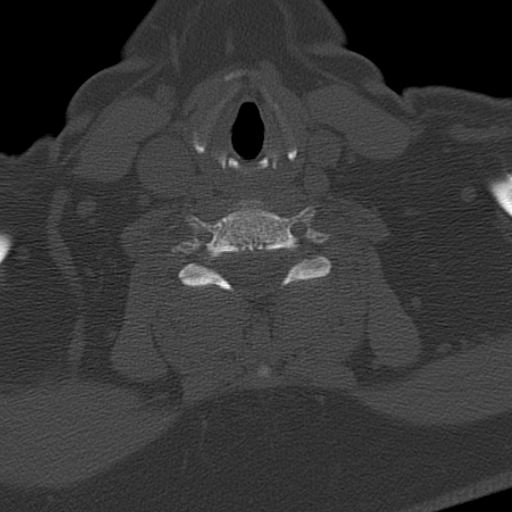
[im 37/80  bone]
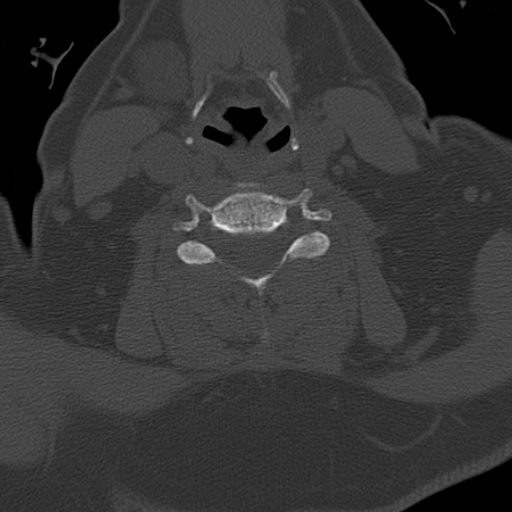
[im 43/80  bone]
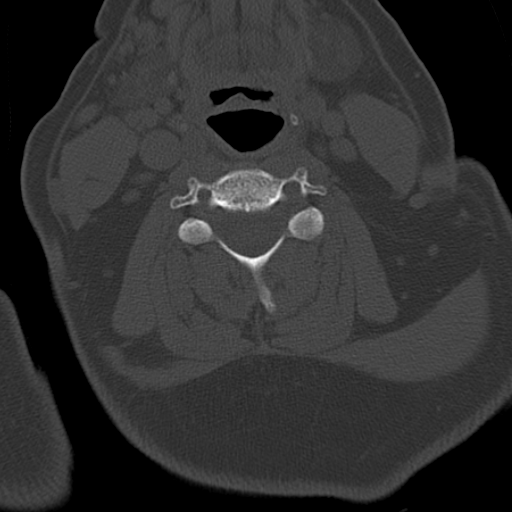
[im 49/80  bone]
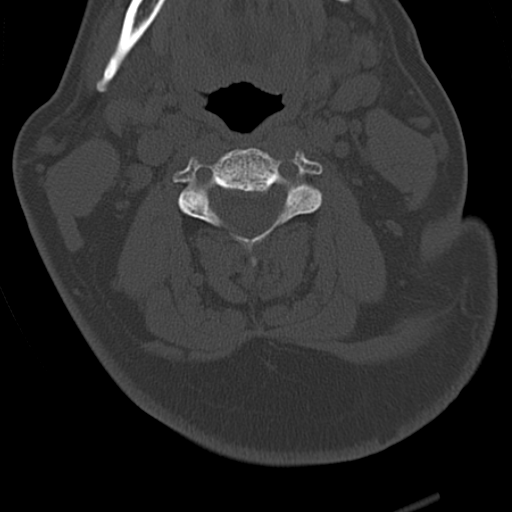
[im 55/80  soft-tissue]
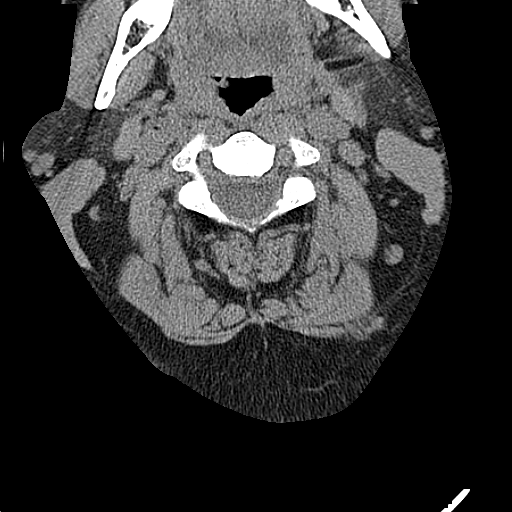
[im 55/80  bone]
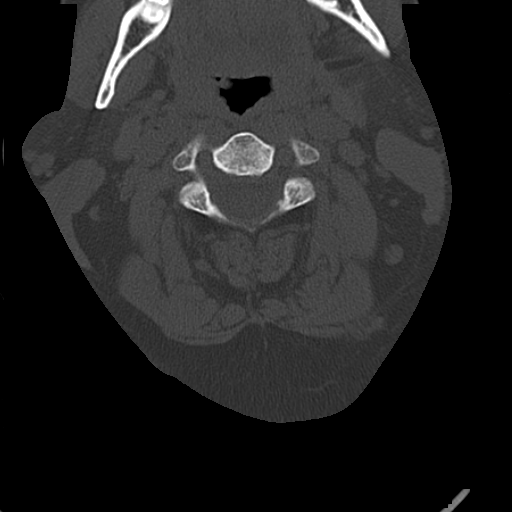
[im 61/80  bone]
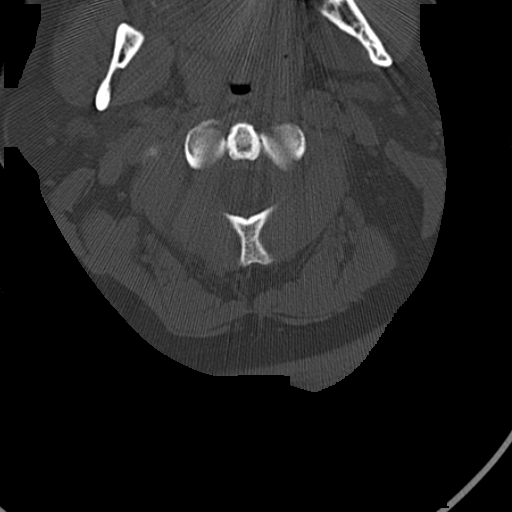
[im 67/80  bone]
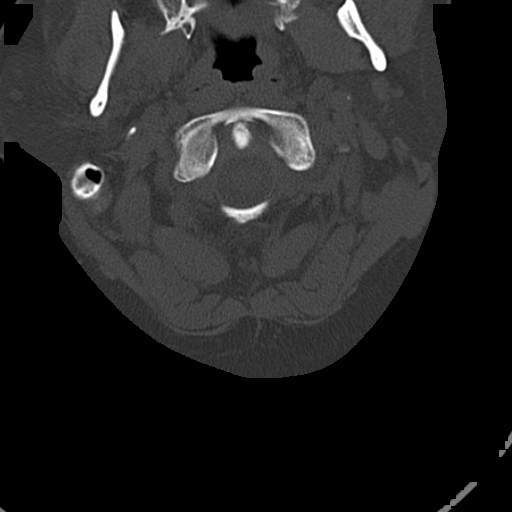
[im 73/80  bone]
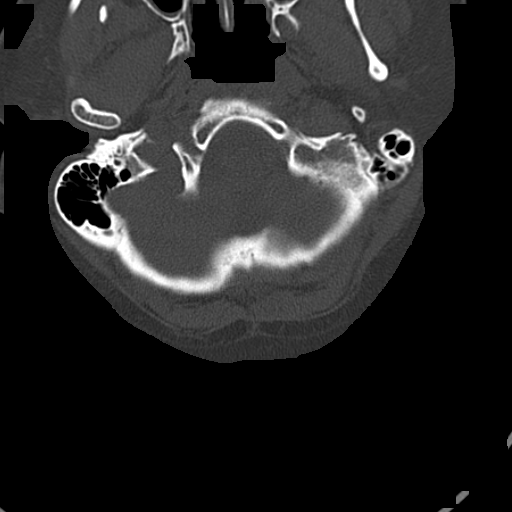

[12 of 14 positions shown; findings below may reference images not displayed]

PROCEDURE:     CT  - CT CERVICAL SPINE WO  - August 11, 2011  [DATE]

RESULT:     Multislice helical acquisition through the cervical spine is
reconstructed in the axial, coronal and sagittal planes at bone window
settings and 2.0 mm slice thickness with comparison made to the previous
exam dated 19 December, 2010.

The spinal alignment is maintained. The prevertebral soft tissues are
normal. There is no fracture or subluxation evident. The atlantoaxial
alignment is normal. The odontoid is intact. The craniocervical junction
appears to be within normal limits.
IMPRESSION: 1. No acute cervical spine bony abnormality. There is loss of the normal
cervical lordosis which can be present with muscle spasm. The prevertebral
soft tissues are normal.

## 2013-06-15 IMAGING — CT CT HEAD WITHOUT CONTRAST
2 series · 16 of 30 positions shown, 20 images · non-contrast
Comparison: none

REASON FOR EXAM: trauma
COMMENTS:   May transport without cardiac monitor

[Series 2: without · axial · non-contrast · 0.39mm/px · z∈[-140,-20]mm · 13 of 30 slices shown, 17 images]
[im 3/30  brain]
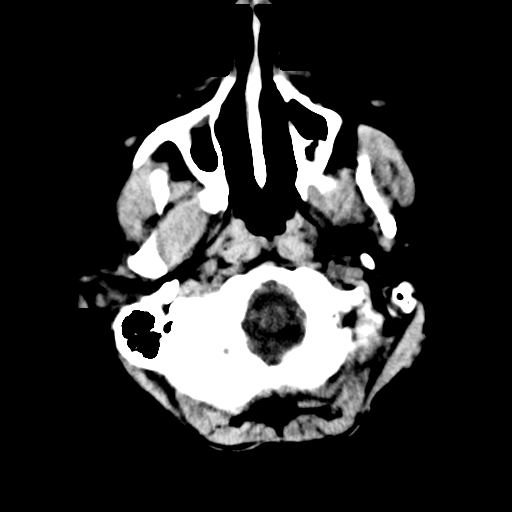
[im 3/30  bone]
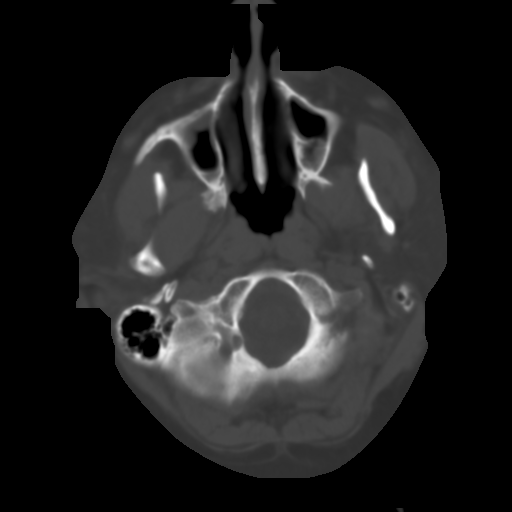
[im 5/30  brain]
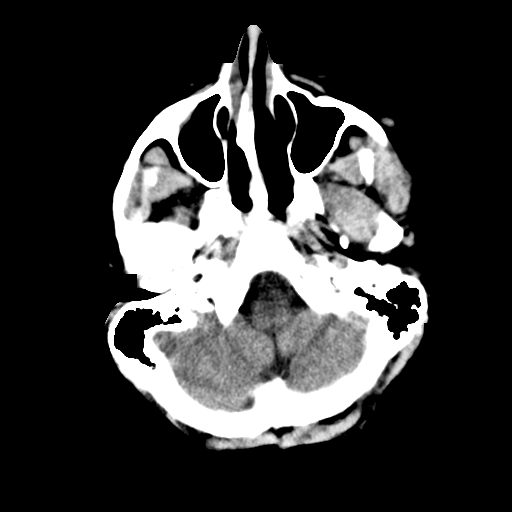
[im 7/30  brain]
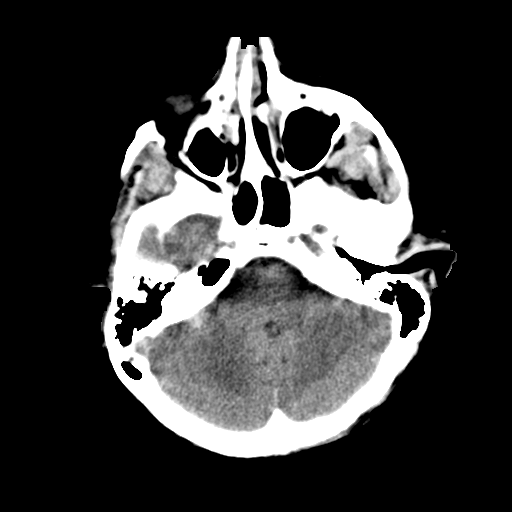
[im 9/30  brain]
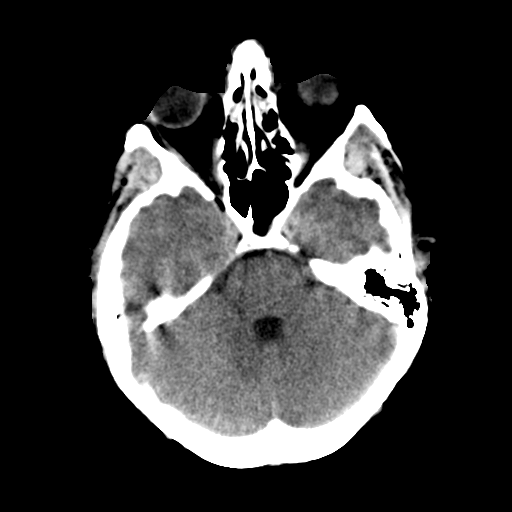
[im 11/30  brain]
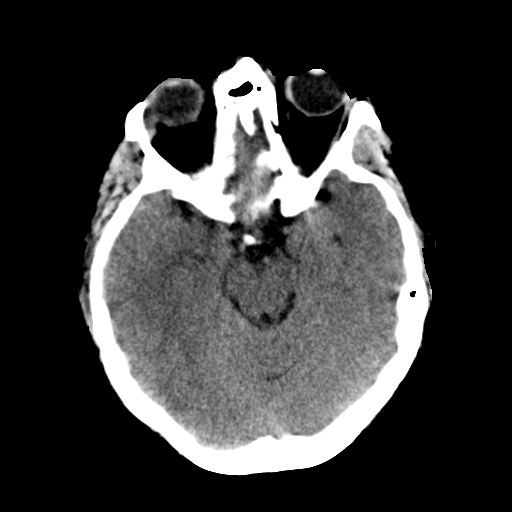
[im 11/30  bone]
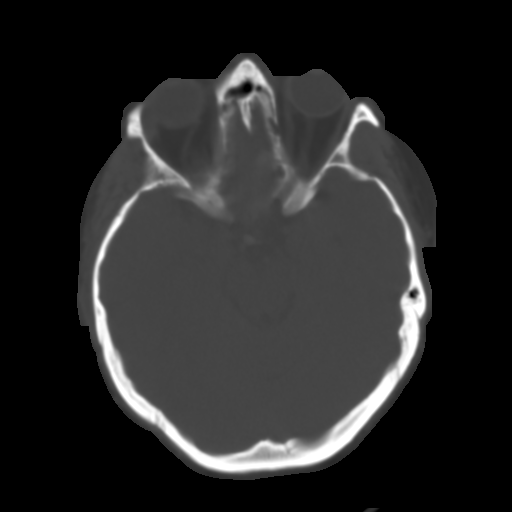
[im 13/30  brain]
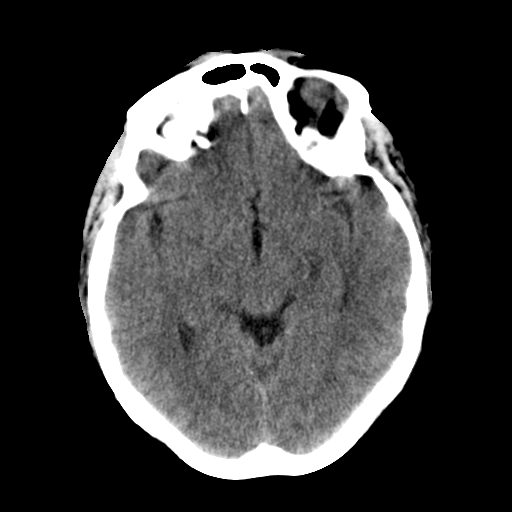
[im 15/30  brain]
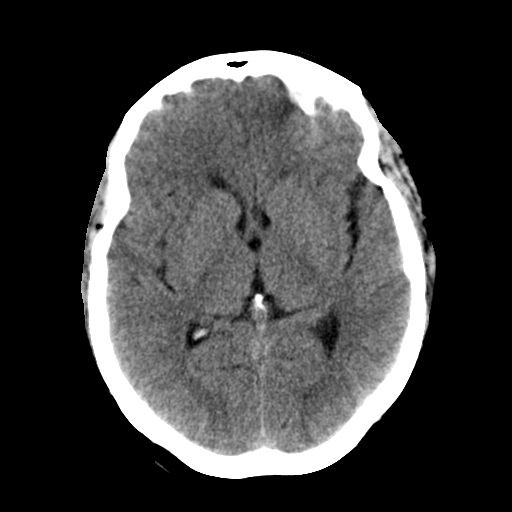
[im 17/30  brain]
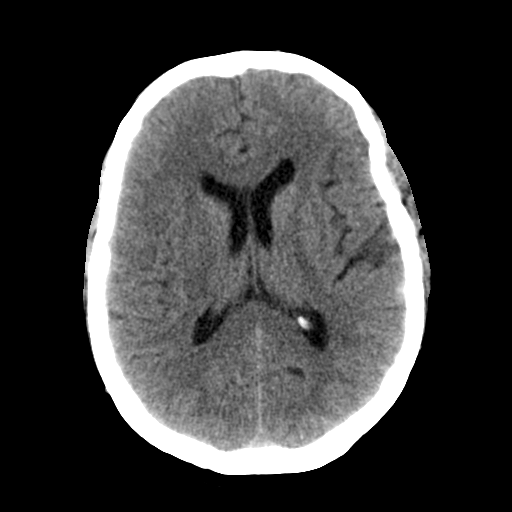
[im 19/30  brain]
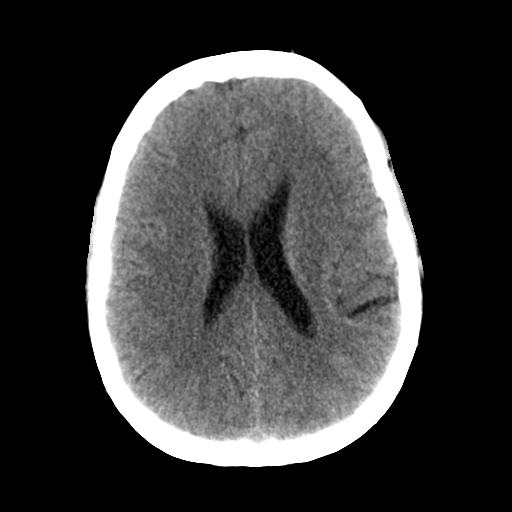
[im 19/30  bone]
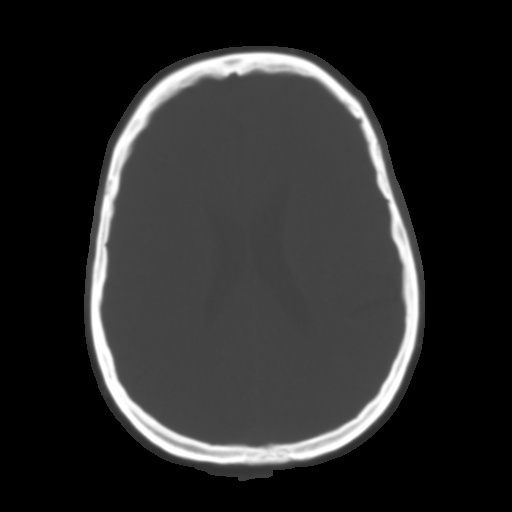
[im 21/30  brain]
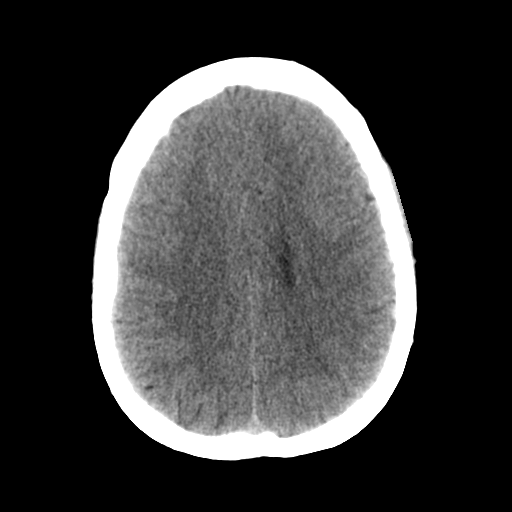
[im 23/30  brain]
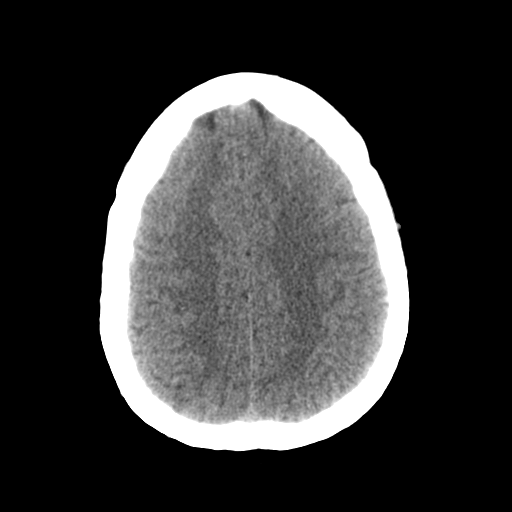
[im 25/30  brain]
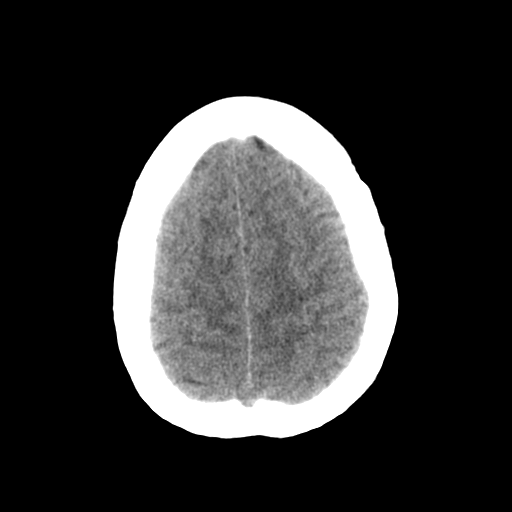
[im 27/30  brain]
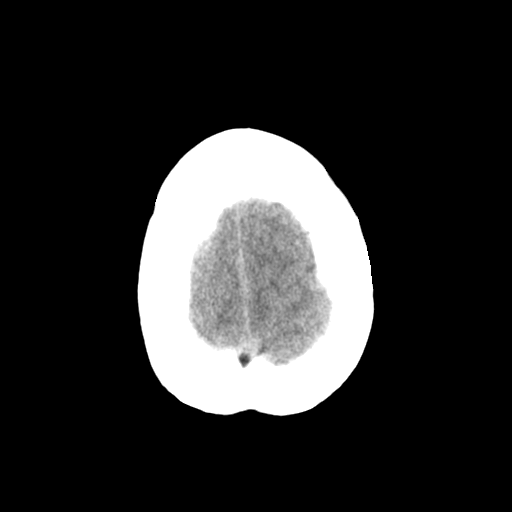
[im 27/30  bone]
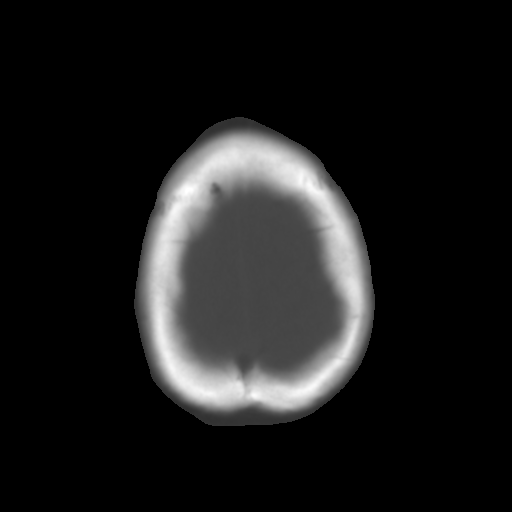

[Series 3: bone · axial · 0.39mm/px · z∈[-140,-100]mm · 3 of 30 slices shown]
[im 3/30  bone]
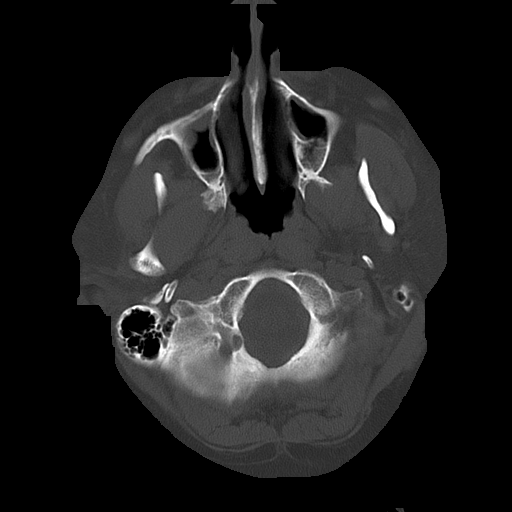
[im 7/30  bone]
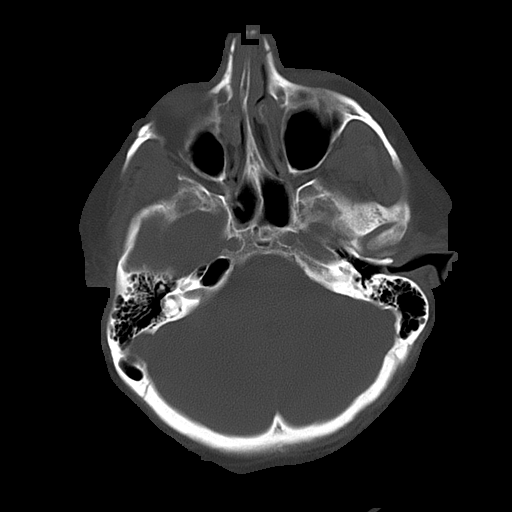
[im 11/30  bone]
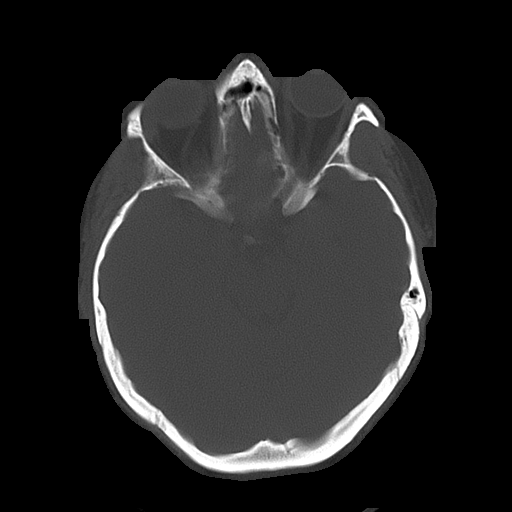

[16 of 30 positions shown; findings below may reference images not displayed]

PROCEDURE:     CT  - CT HEAD WITHOUT CONTRAST  - August 11, 2011  [DATE]

RESULT:     CT of the brain is compared to the previous exam dated 27 May, 2011.

The ventricles and sulci are normal. There is no hemorrhage. There is no
focal mass, mass-effect or midline shift. There is no evidence of edema or
territorial infarct. The bone windows demonstrate mucosal thickening in the
right maxillary sinus and bilateral ethmoid air cells with grossly normal
aeration otherwise in the sinuses and mastoids.. There is no skull fracture
demonstrated.
IMPRESSION: 1. No acute intracranial abnormality. Stable appearance.

## 2013-06-15 IMAGING — CR RIGHT HIP - COMPLETE 2+ VIEW
1 series · 2 of 2 positions shown · non-contrast
Comparison: none

REASON FOR EXAM: trauma
COMMENTS:

PROCEDURE:     DXR - DXR HIP RIGHT COMPLETE  - August 11, 2011  [DATE]
RESULT:     AP and lateral views the right hip demonstrate no evidence of
fracture, dislocation or radiopaque foreign body.

[Series 1: t hip ap right · 0.14mm/px · 2 of 2 slices shown]
[im 1/2]
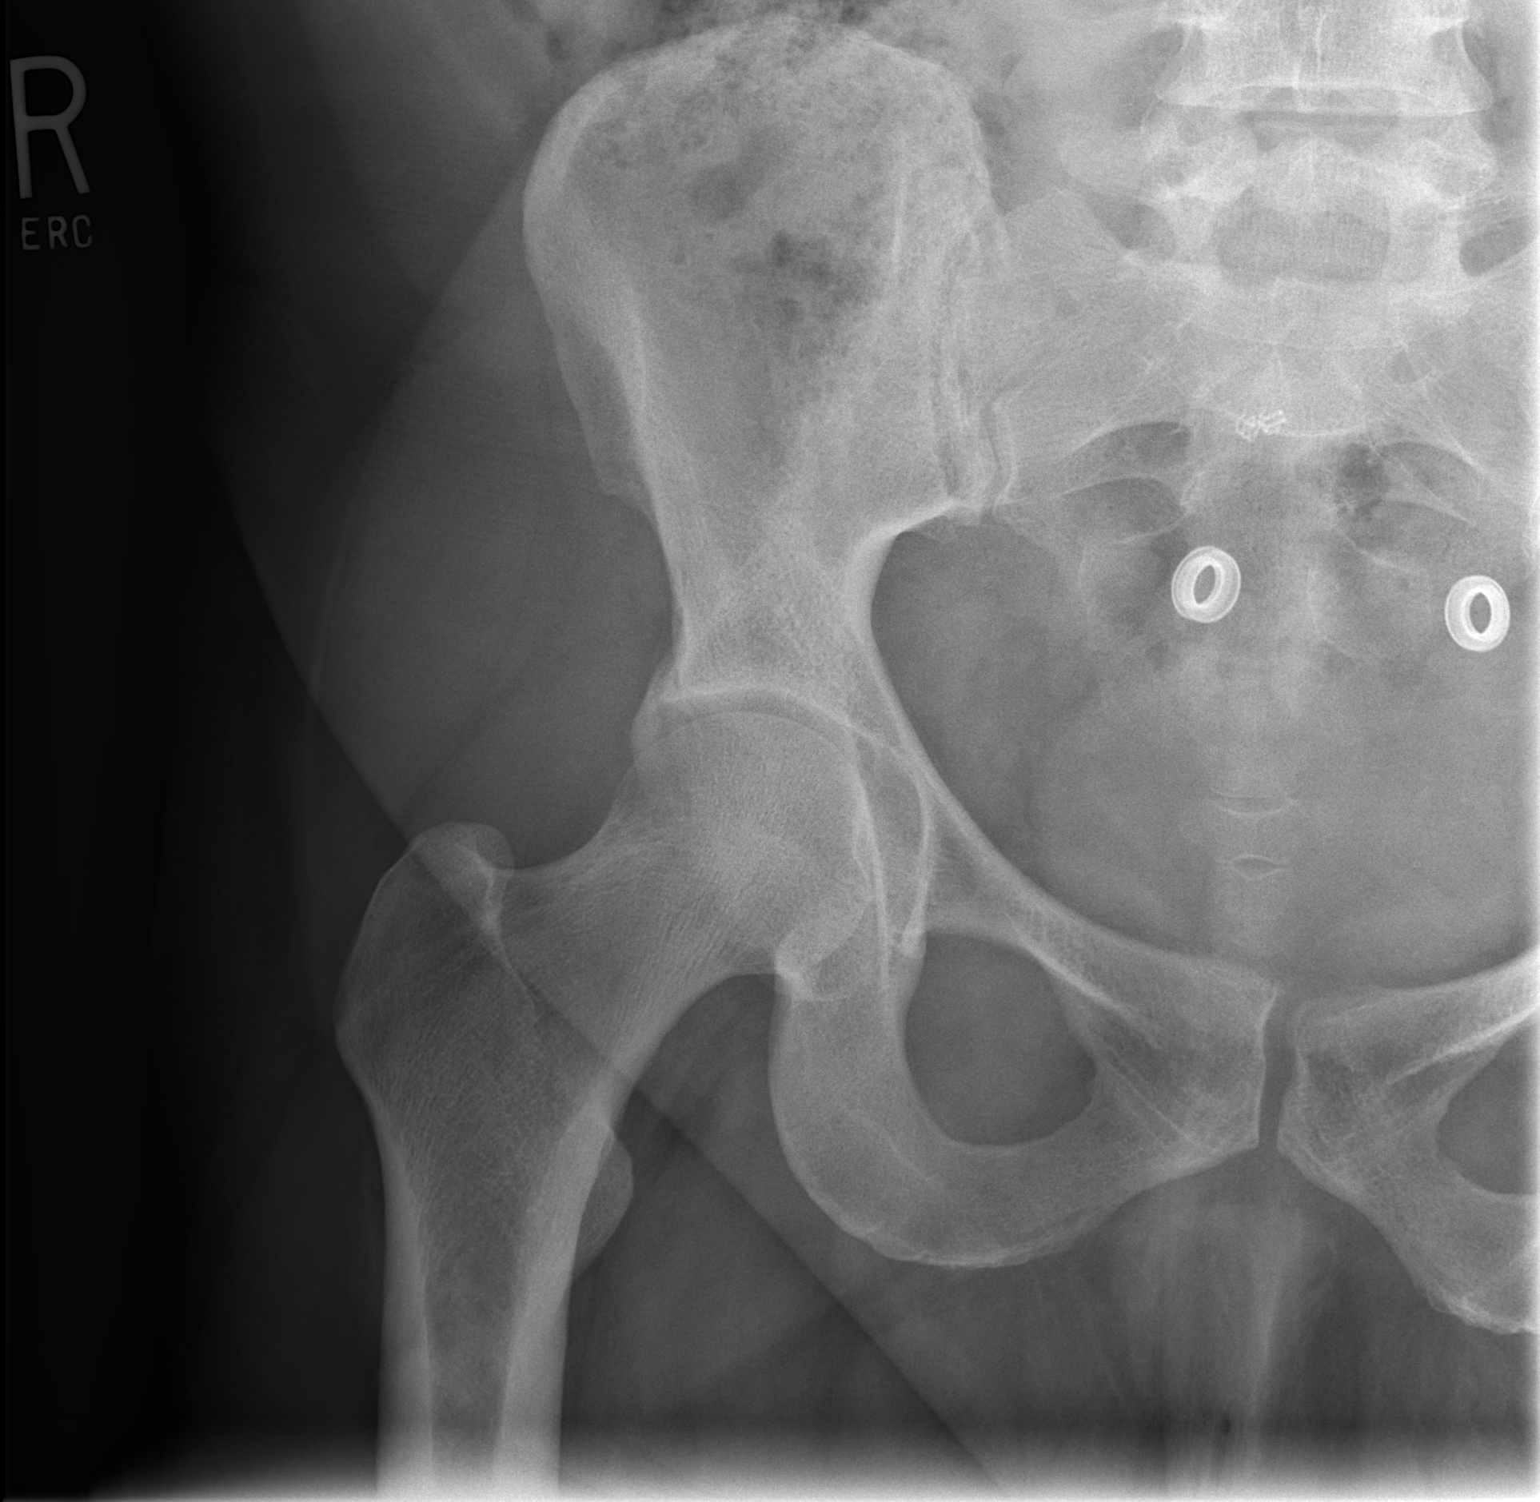
[im 2/2]
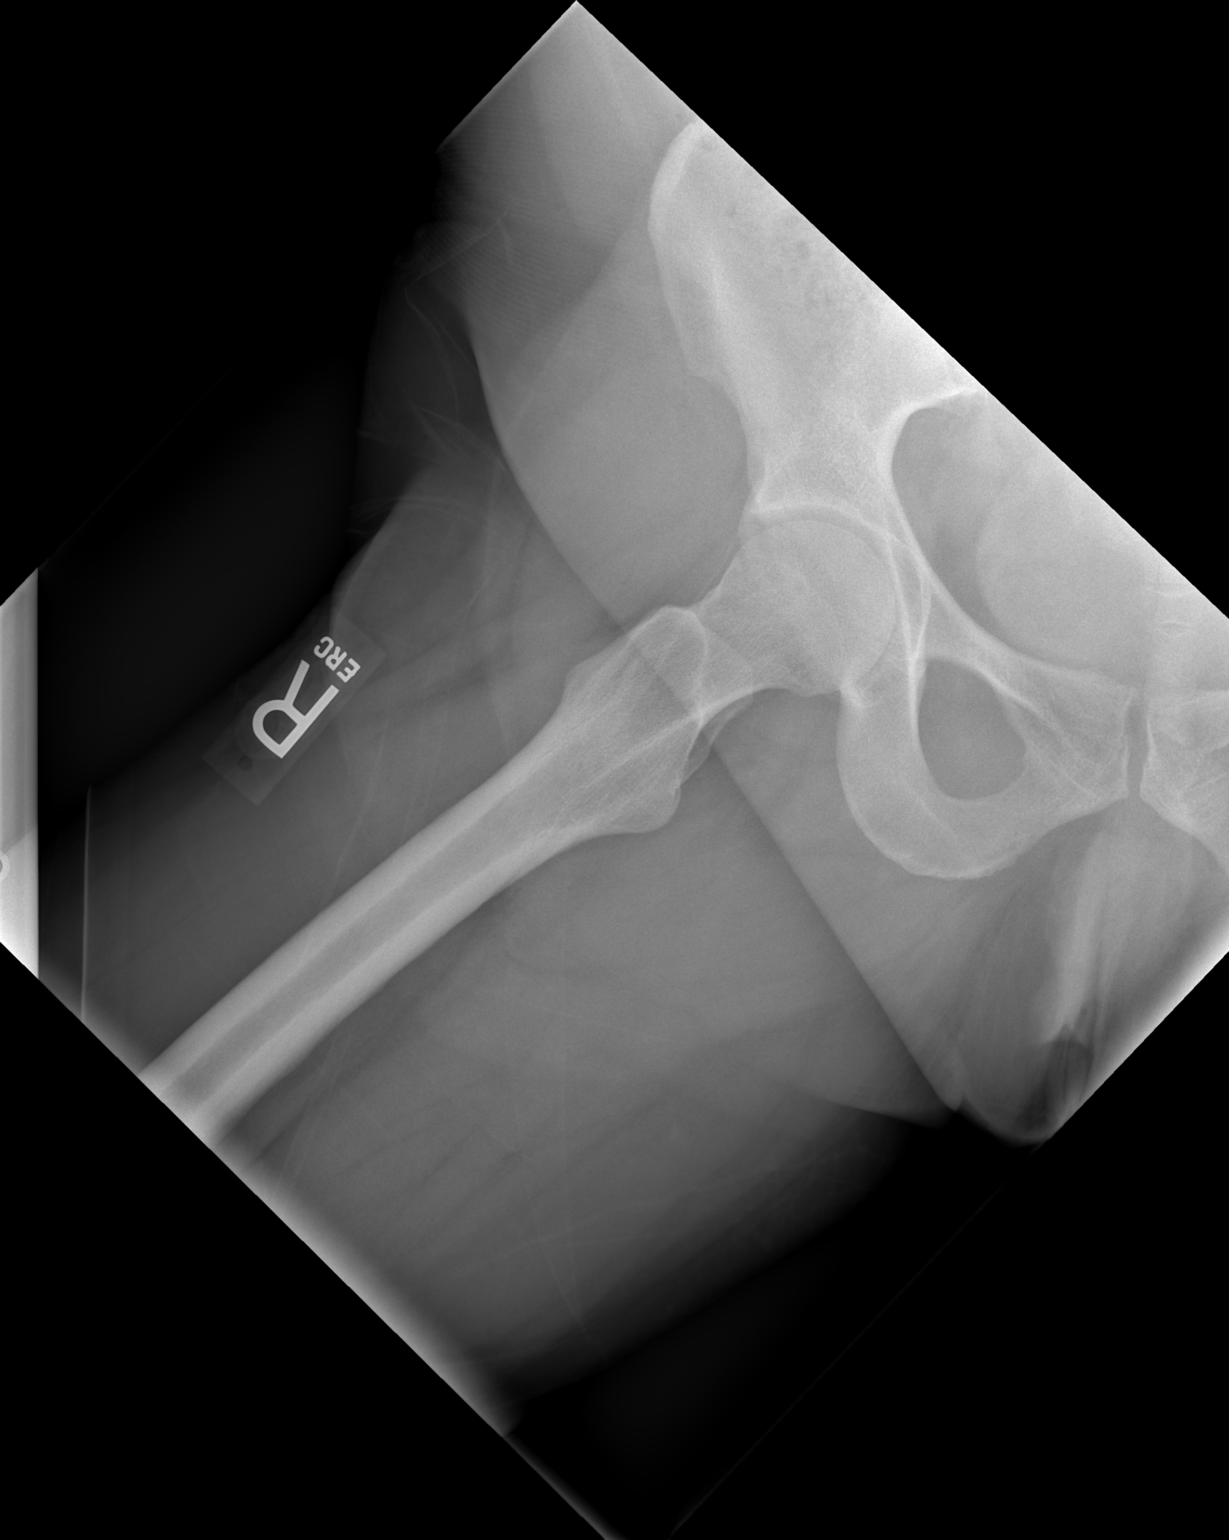

[2 of 2 positions shown; findings below may reference images not displayed]

IMPRESSION: No acute bony abnormality evident in the right hip.

## 2013-06-28 IMAGING — CT CT ABD-PELV W/O CM
1 of 2 series · 15 of 32 positions shown, 19 images · non-contrast
Comparison: none

REASON FOR EXAM: (1) rlq abd pain (contrast allergy); (2) as above
COMMENTS:

[Series 2: 3mm soft tissue · axial · 0.73mm/px · z∈[-786,-375]mm · 15 of 151 slices shown, 19 images]
[im 7/151  soft-tissue]
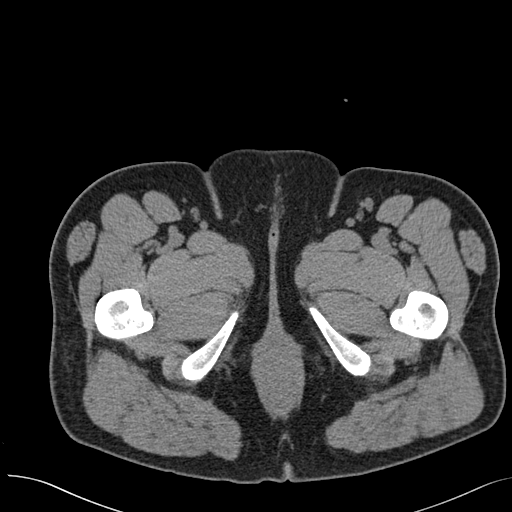
[im 7/151  bone]
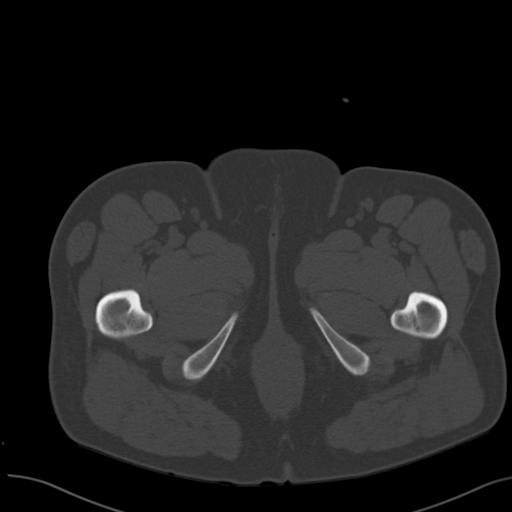
[im 19/151  soft-tissue]
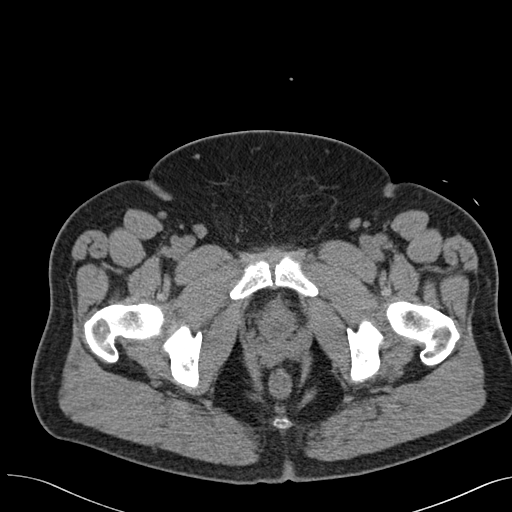
[im 32/151  soft-tissue]
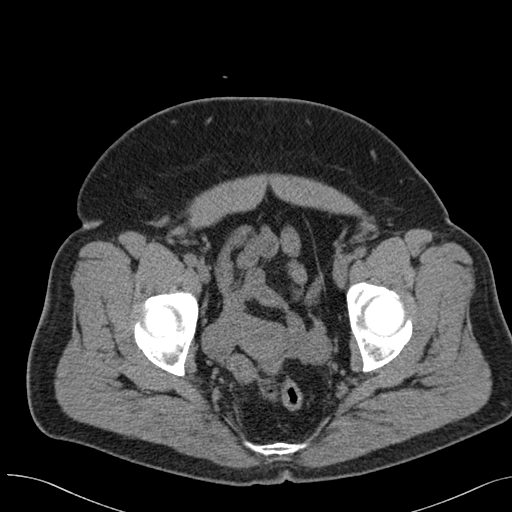
[im 44/151  soft-tissue]
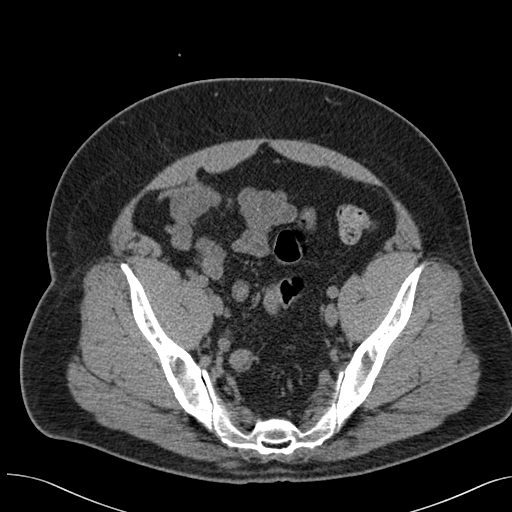
[im 51/151  soft-tissue]
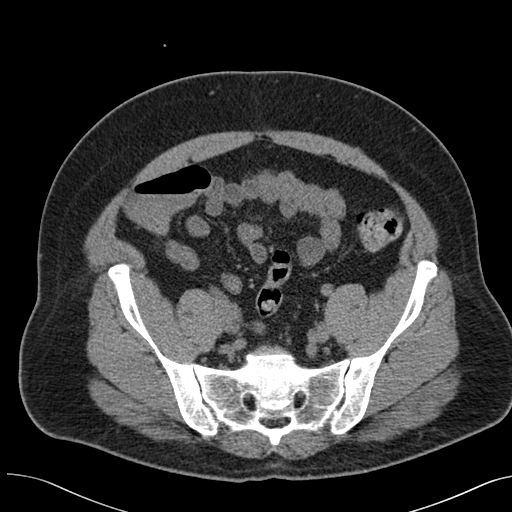
[im 63/151  soft-tissue]
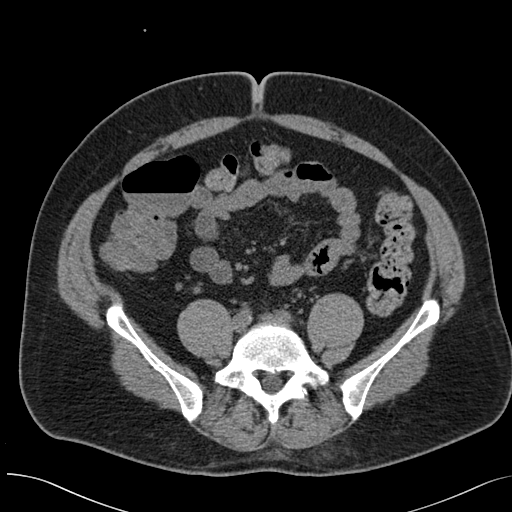
[im 76/151  soft-tissue]
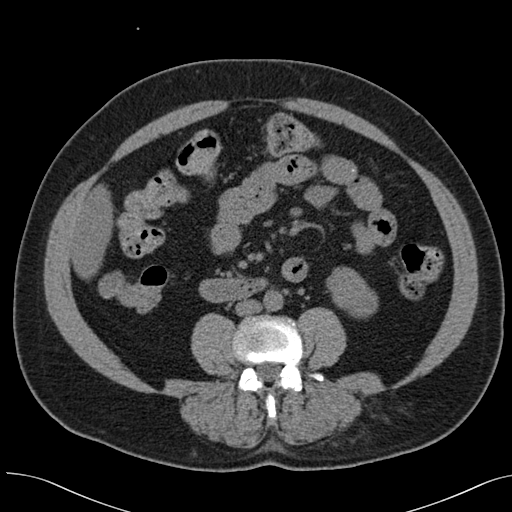
[im 88/151  soft-tissue]
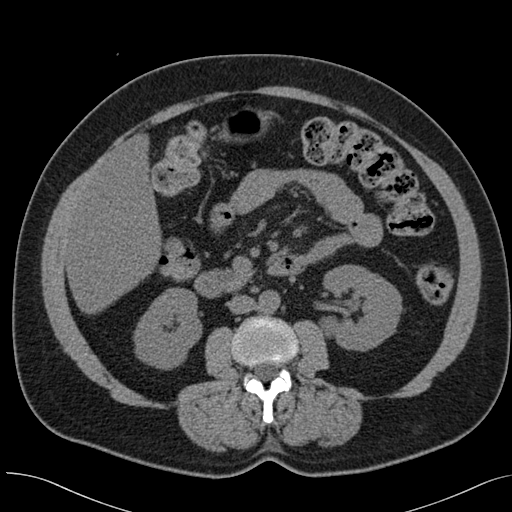
[im 101/151  soft-tissue]
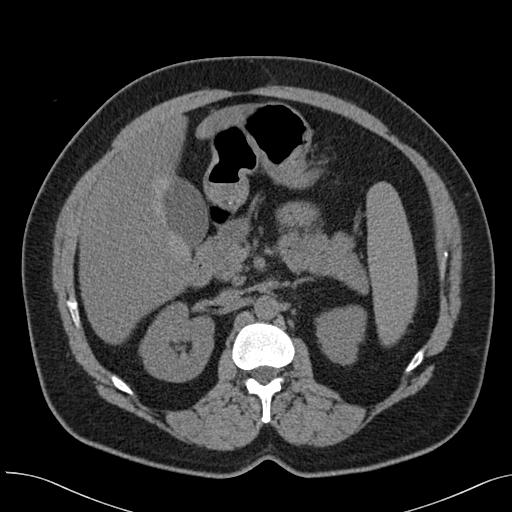
[im 101/151  bone]
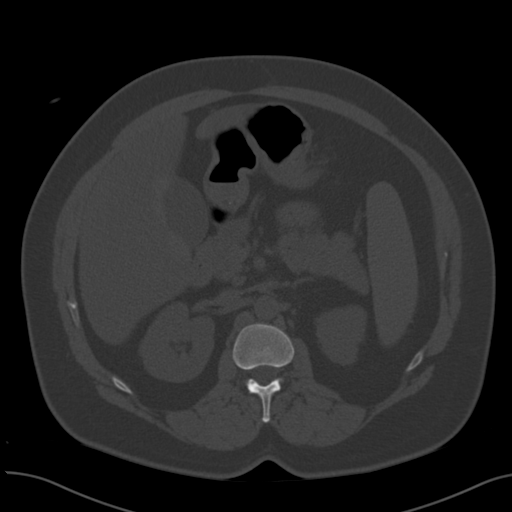
[im 107/151  soft-tissue]
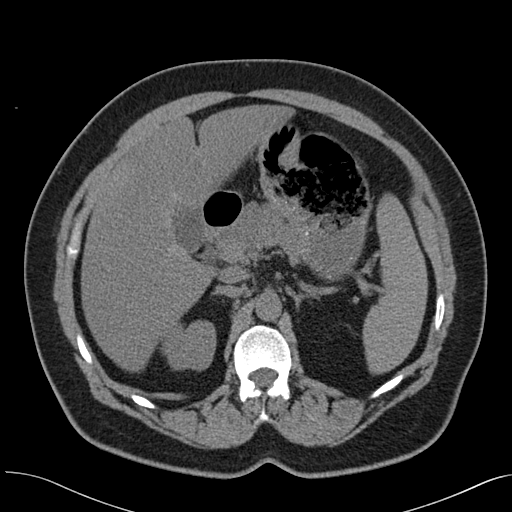
[im 119/151  soft-tissue]
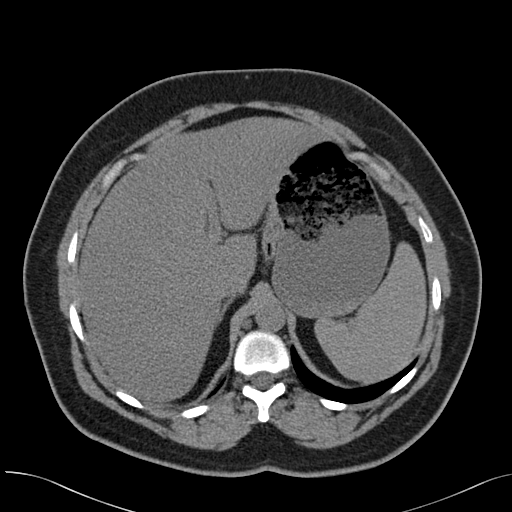
[im 126/151  lung]
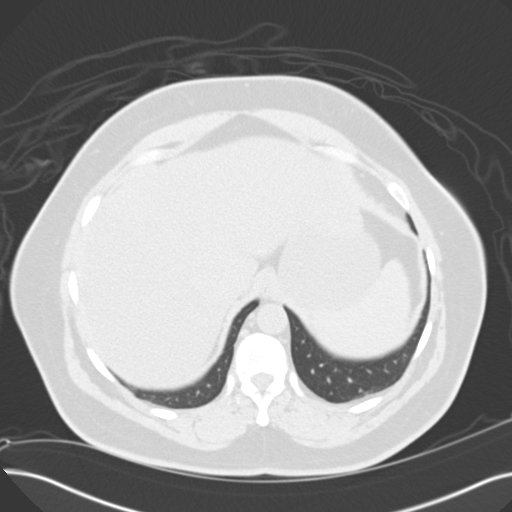
[im 132/151  soft-tissue]
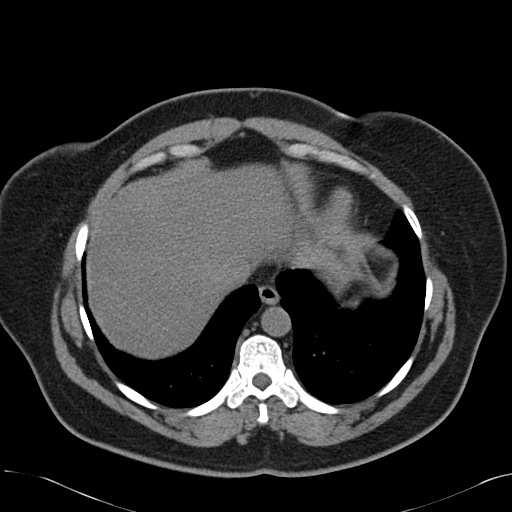
[im 132/151  lung]
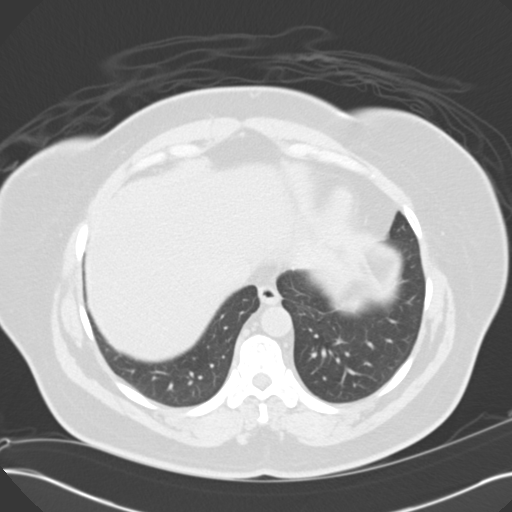
[im 138/151  lung]
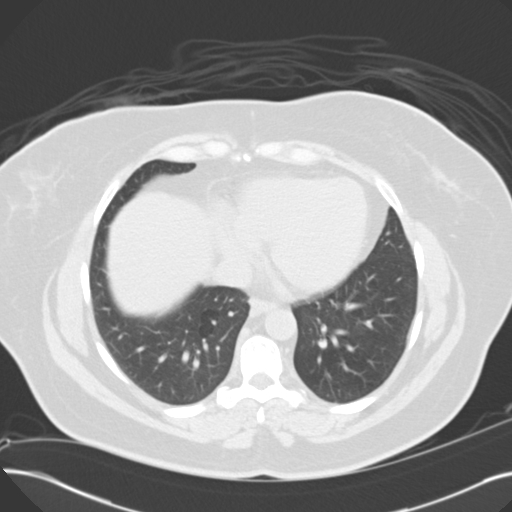
[im 144/151  soft-tissue]
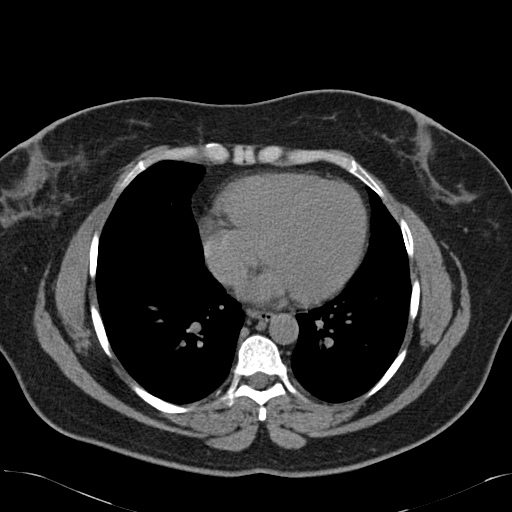
[im 144/151  lung]
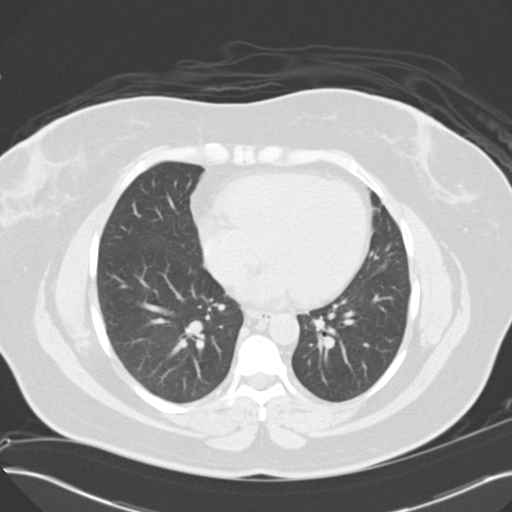

[15 of 32 positions shown; findings below may reference images not displayed]

PROCEDURE:     CT  - CT ABDOMEN AND PELVIS W[DATE]  [DATE]

RESULT:     Axial noncontrast CT scanning was performed through the abdomen
and pelvis with reconstructions at 3 mm intervals and slice thicknesses.
Review of multiplanar reconstructed images was performed separately on the
VIA monitor. Comparison is made to the study November 05, 2011.

The liver exhibits decreased density diffusely consistent with fatty
infiltration. Focal areas of higher density are consistent with fatty
sparing. The gallbladder is adequately distended with no evidence of stones.
The spleen, pancreas, adrenal glands, and kidneys exhibit no acute
abnormality. The stomach is distended with food particles. The unopacified
loops of small and large bowel exhibit no evidence of ileus nor of
obstruction. A normal calibered uninflamed appearing appendix containing a 2
mm diameter appendicolith is demonstrated. The uterus and adnexal structures
exhibit no acute abnormalities. The partially distended urinary bladder is
normal in appearance. There are small inguinal hernias containing only fat.
There is no significant umbilical hernia. The lung bases are clear. The
lumbar vertebral bodies are preserved in height. The caliber of the
abdominal aorta is normal. There is no periaortic or pericaval
lymphadenopathy.
IMPRESSION: 1. There are fatty infiltrative changes of the liver. No calcified stones
are demonstrated. No acute abnormality of the pancreas or spleen is
demonstrated.
2. The stomach is mildly distended with food and gas. I do not see evidence
of a small or large bowel obstruction, gastroenteritis type pattern or
evidence of acute appendicitis or diverticulitis.
3. No acute urinary tract abnormality is demonstrated.

A preliminary report was sent to the [HOSPITAL] the conclusion
of the study.

[REDACTED]

## 2013-07-10 IMAGING — CT CT CERVICAL SPINE WITHOUT CONTRAST
1 series · 12 of 14 positions shown, 15 images · non-contrast
Comparison: None

REASON FOR EXAM: neck pain
COMMENTS:

PROCEDURE:     CT  - CT CERVICAL SPINE WO  - September 05, 2011  [DATE]
RESULT:     Clinical Indication: Trauma
TECHNIQUE: Multiple axial CT images from the skull base to the mid vertebral
body of T1. obtained with sagittal and coronal reformatted images provided.

[Series 4: axial · axial · 0.33mm/px · z∈[-291,-146]mm · 12 of 89 slices shown, 15 images]
[im 7/89  soft-tissue]
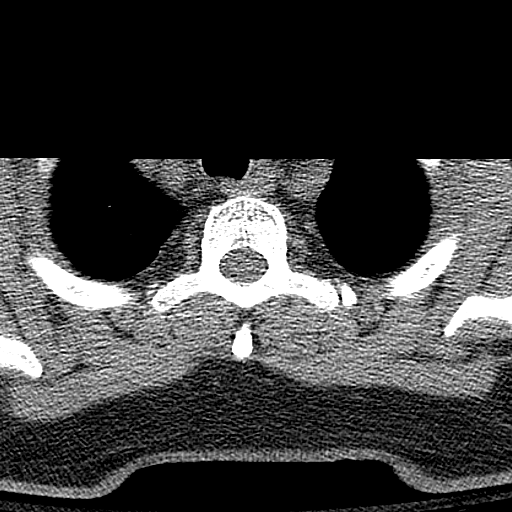
[im 7/89  bone]
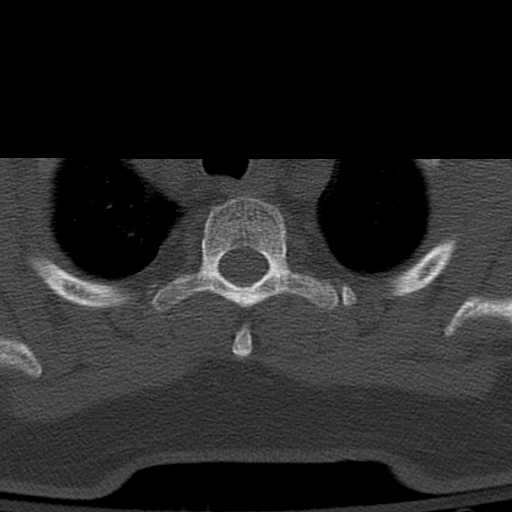
[im 14/89  bone]
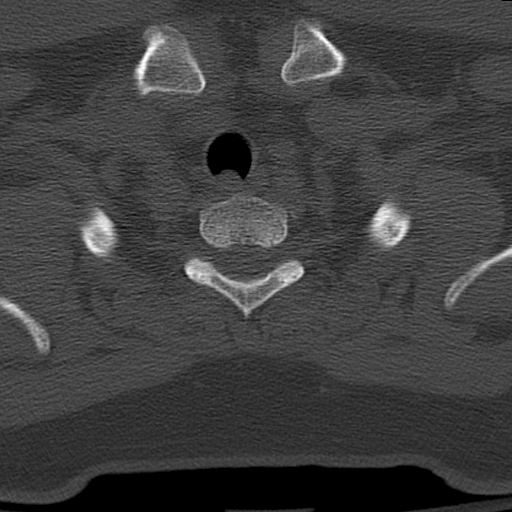
[im 21/89  bone]
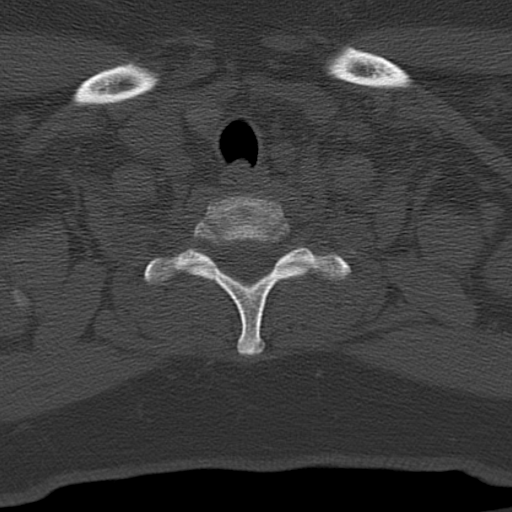
[im 28/89  bone]
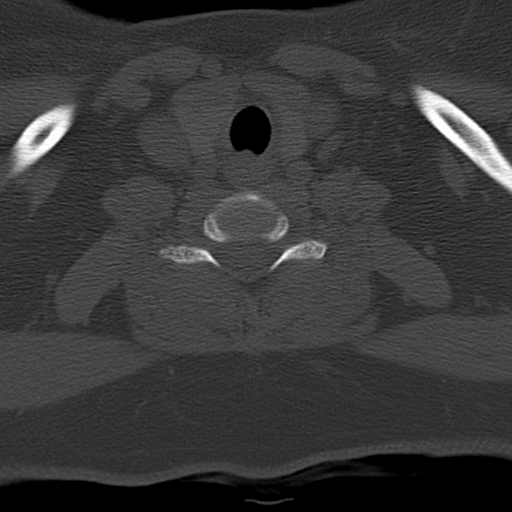
[im 34/89  soft-tissue]
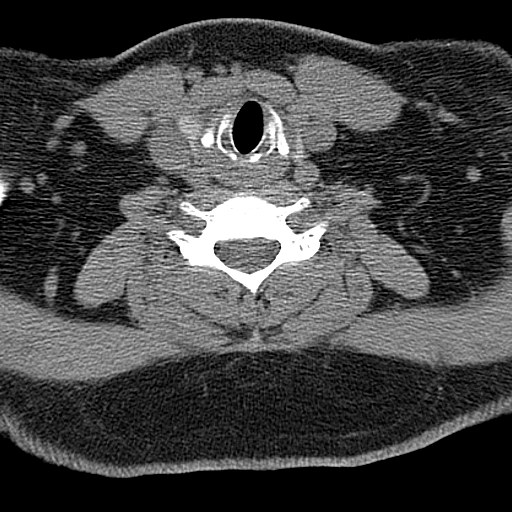
[im 34/89  bone]
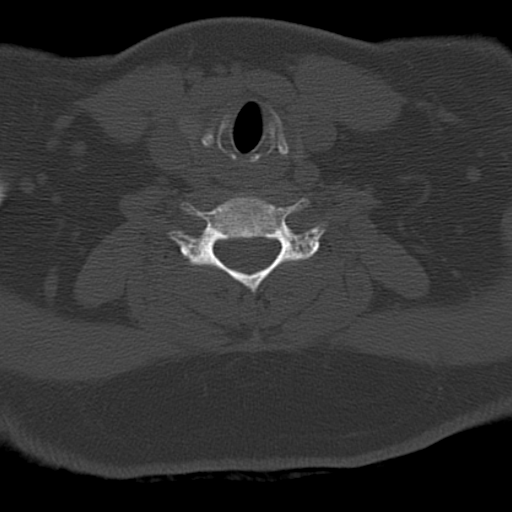
[im 41/89  bone]
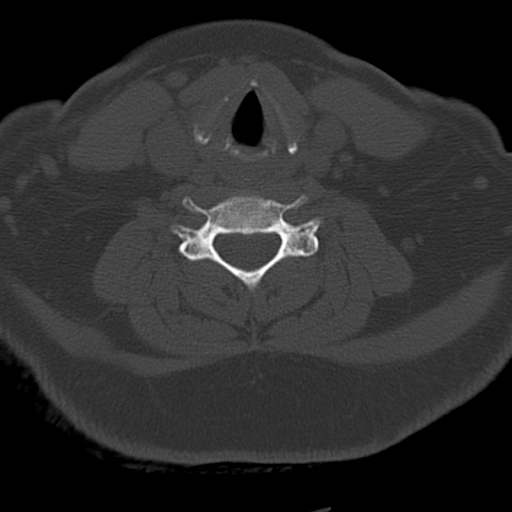
[im 48/89  bone]
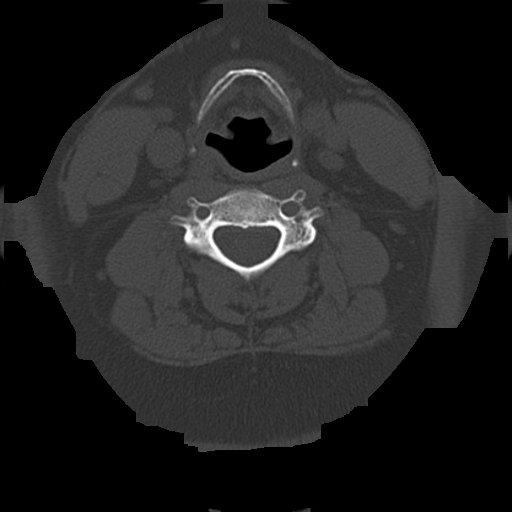
[im 55/89  bone]
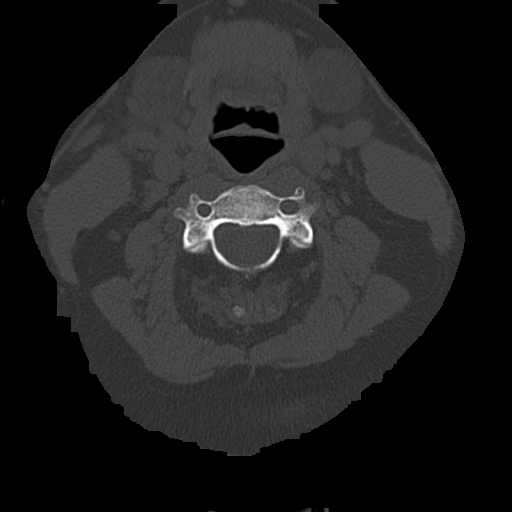
[im 61/89  soft-tissue]
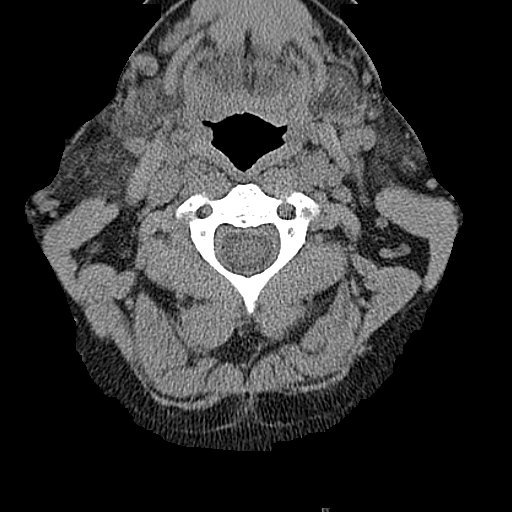
[im 61/89  bone]
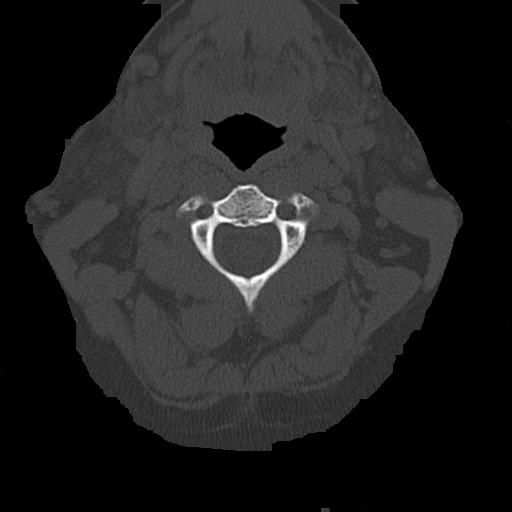
[im 68/89  bone]
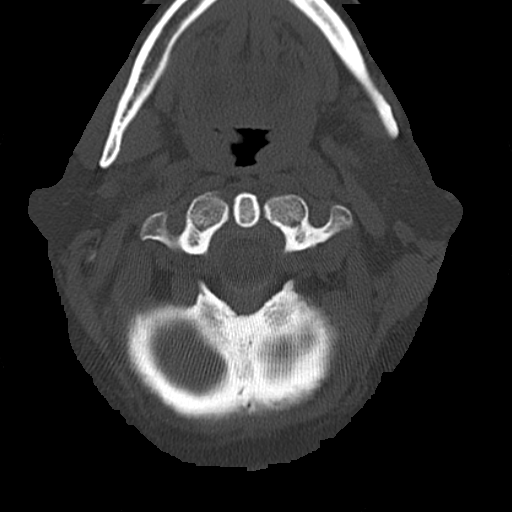
[im 75/89  bone]
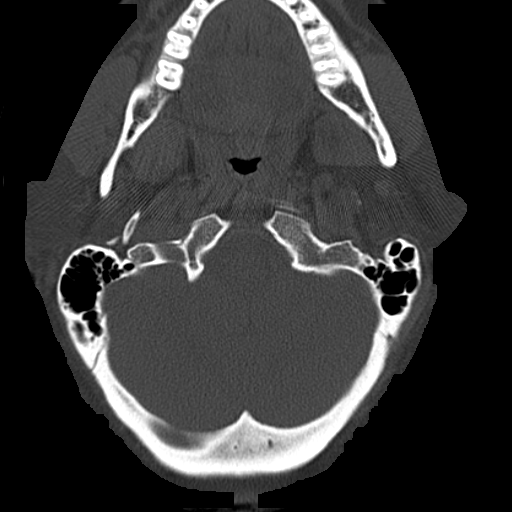
[im 82/89  bone]
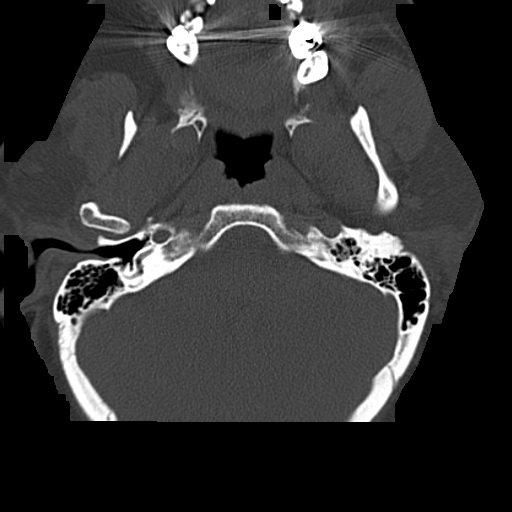

[12 of 14 positions shown; findings below may reference images not displayed]

FINDINGS: The alignment is anatomic. The vertebral body heights are maintained. There
is no acute fracture or static listhesis. The prevertebral soft tissues are
normal. The intraspinal soft tissues are not fully imaged on this
examination due to poor soft tissue contrast, but there is no soft tissue
gross abnormality.

The disc spaces are maintained.

The visualized portions of the lung apices demonstrate no focal abnormality.
IMPRESSION: 1. No acute osseous injury of the cervical spine.

2. Ligamentous injury is not evaluated. If there is high clinical concern
for ligamentous injury, consider MRI or flexion/extension radiographs as
clinically indicated and tolerated.

[REDACTED]

## 2013-08-12 ENCOUNTER — Ambulatory Visit: Payer: Self-pay | Admitting: Physician Assistant

## 2013-08-12 LAB — COMPREHENSIVE METABOLIC PANEL
ALK PHOS: 120 U/L — AB
ALT: 73 U/L (ref 12–78)
ANION GAP: 12 (ref 7–16)
Albumin: 4.2 g/dL (ref 3.4–5.0)
BUN: 37 mg/dL — AB (ref 7–18)
Bilirubin,Total: 1.2 mg/dL — ABNORMAL HIGH (ref 0.2–1.0)
CALCIUM: 8.9 mg/dL (ref 8.5–10.1)
CHLORIDE: 99 mmol/L (ref 98–107)
CO2: 22 mmol/L (ref 21–32)
CREATININE: 1.11 mg/dL (ref 0.60–1.30)
EGFR (African American): >60
Glucose: 192 mg/dL — ABNORMAL HIGH (ref 65–99)
Osmolality: 280 (ref 275–301)
POTASSIUM: 3.2 mmol/L — AB (ref 3.5–5.1)
SGOT(AST): 59 U/L — ABNORMAL HIGH (ref 15–37)
SODIUM: 133 mmol/L — AB (ref 136–145)
TOTAL PROTEIN: 8.1 g/dL (ref 6.4–8.2)

## 2013-08-12 LAB — CBC WITH DIFFERENTIAL/PLATELET
Basophil #: 0.1 10*3/uL (ref 0.0–0.1)
Basophil %: 0.6 %
EOS ABS: 0 10*3/uL (ref 0.0–0.7)
Eosinophil %: 0.2 %
HCT: 49.3 % — ABNORMAL HIGH (ref 35.0–47.0)
HGB: 16.6 g/dL — ABNORMAL HIGH (ref 12.0–16.0)
LYMPHS PCT: 9.5 %
Lymphocyte #: 1 10*3/uL (ref 1.0–3.6)
MCH: 28.8 pg (ref 26.0–34.0)
MCHC: 33.8 g/dL (ref 32.0–36.0)
MCV: 85 fL (ref 80–100)
Monocyte #: 0.9 x10 3/mm (ref 0.2–0.9)
Monocyte %: 9.2 %
NEUTROS ABS: 8.2 10*3/uL — AB (ref 1.4–6.5)
Neutrophil %: 80.5 %
PLATELETS: 209 10*3/uL (ref 150–440)
RBC: 5.77 10*6/uL — AB (ref 3.80–5.20)
RDW: 14.7 % — AB (ref 11.5–14.5)
WBC: 10.1 10*3/uL (ref 3.6–11.0)

## 2013-08-27 IMAGING — CR LEFT WRIST - COMPLETE 3+ VIEW
1 series · 5 of 5 positions shown · non-contrast
Comparison: none

REASON FOR EXAM: wrist pain, acute
COMMENTS:

PROCEDURE:     MDR - MDR WRIST LT COMP WITH OBLIQUES  - December 16, 2012  [DATE]
RESULT:     Comparison: None

[Series 1: pa · 0.17mm/px · 5 of 5 slices shown]
[im 1/5]
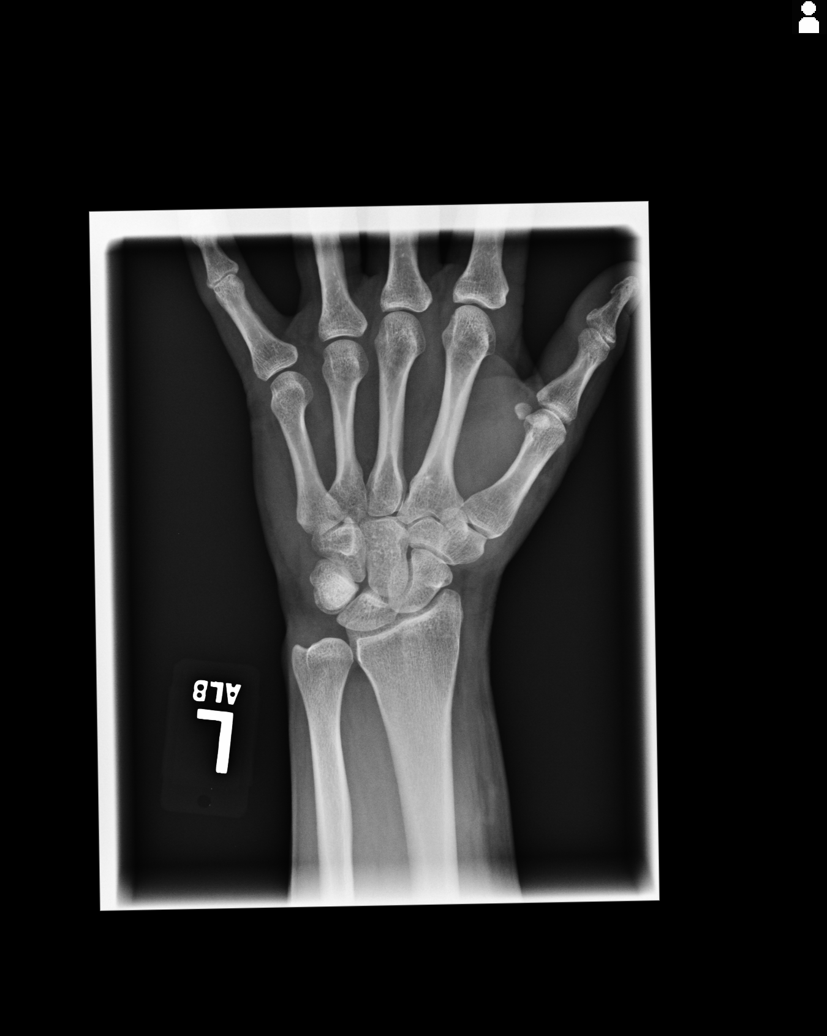
[im 2/5]
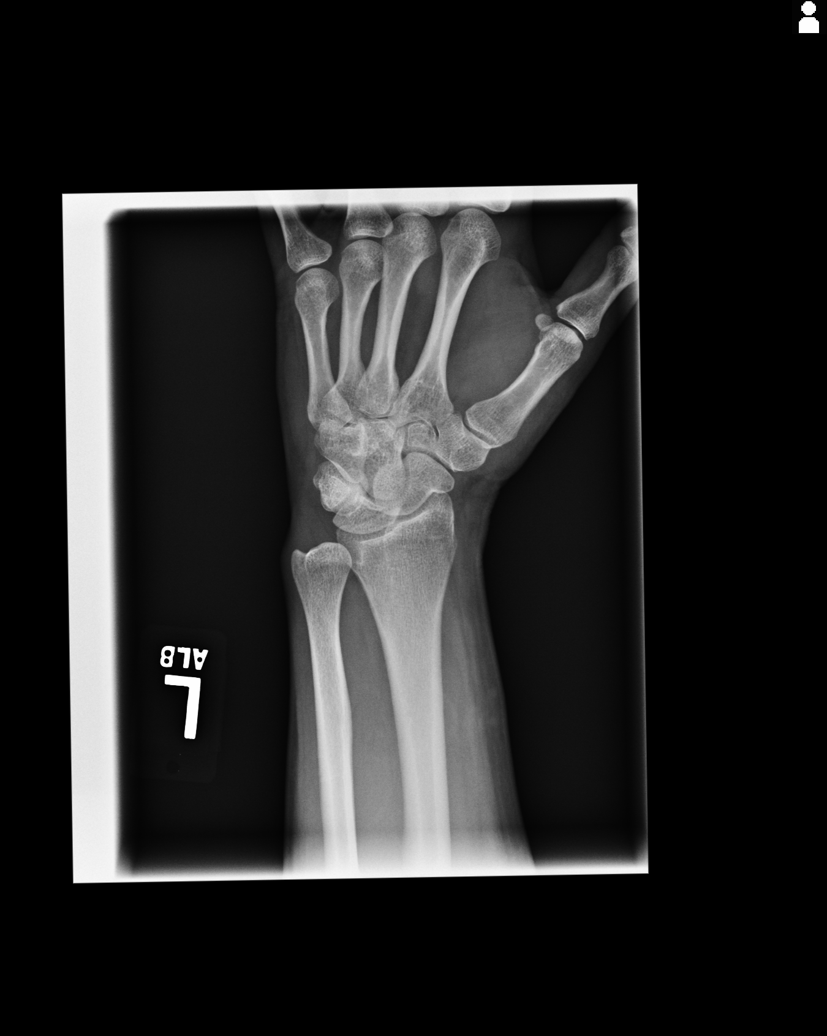
[im 3/5]
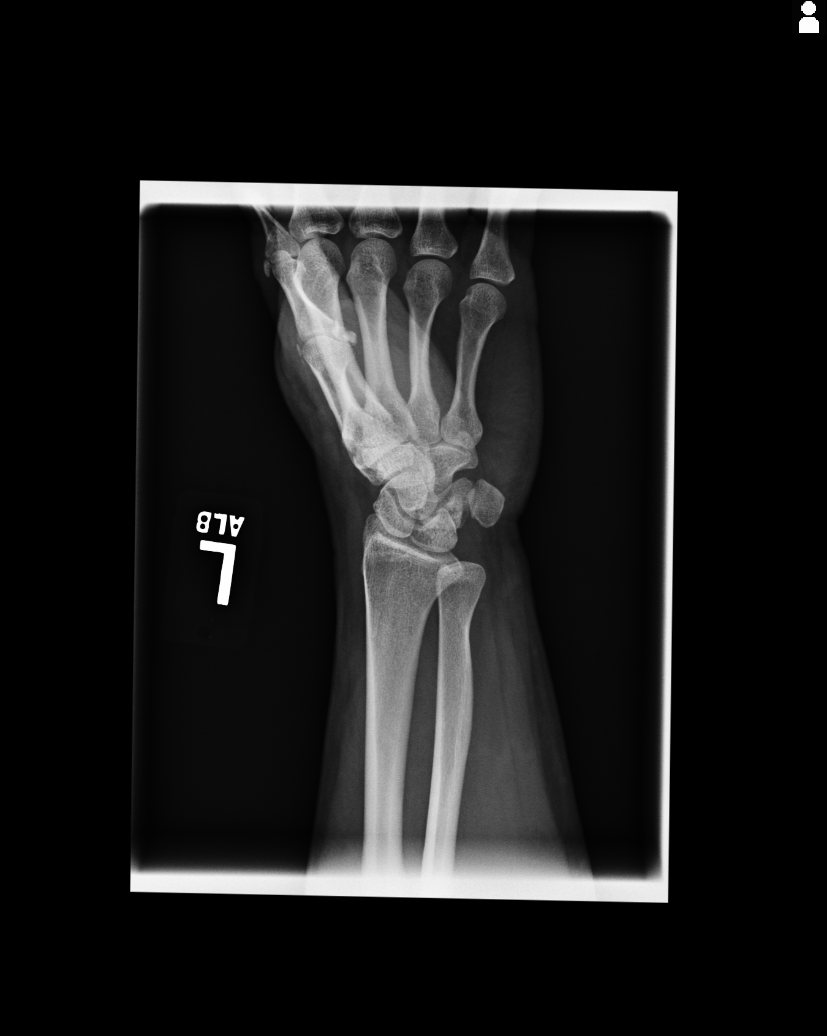
[im 4/5]
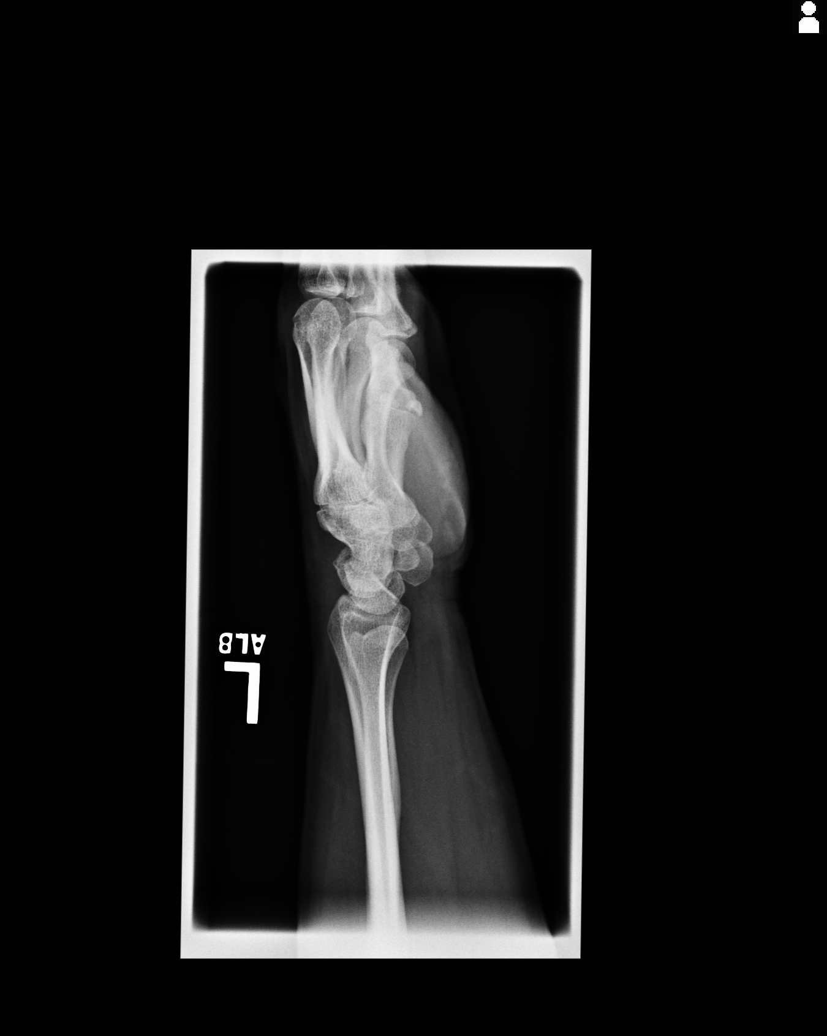
[im 5/5]
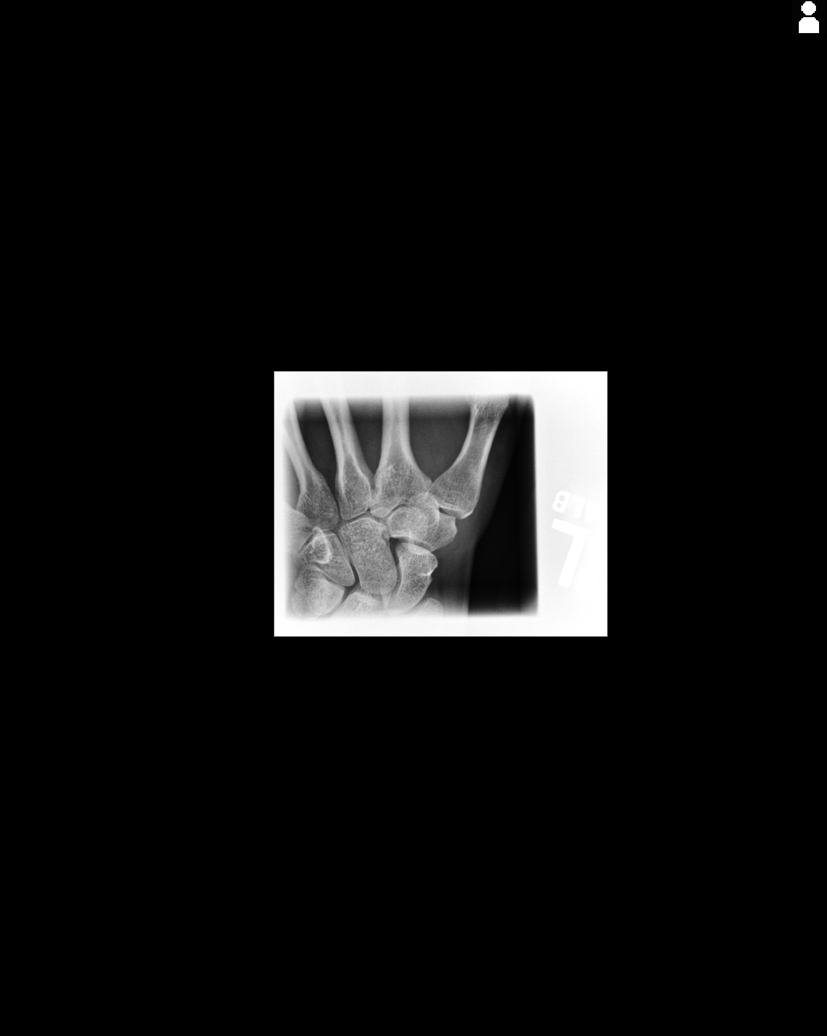

[5 of 5 positions shown; findings below may reference images not displayed]

FINDINGS: 5 views of the left wrist demonstrates no acute fracture or dislocation. The
joint spaces are maintained. The soft tissues appear normal.
IMPRESSION: No acute osseous abnormality of the left wrist.

If there is clinical concern regarding a radiographically occult scaphoid
fracture, snuff box tenderness, or ligamentous injury, further assessment
with MRI is recommended.

[REDACTED]

## 2013-10-15 ENCOUNTER — Emergency Department: Payer: Self-pay | Admitting: Emergency Medicine

## 2013-10-15 LAB — URINALYSIS, COMPLETE
BACTERIA: NONE SEEN
BILIRUBIN, UR: NEGATIVE
Blood: NEGATIVE
Glucose,UR: NEGATIVE mg/dL (ref 0–75)
Hyaline Cast: 2
KETONE: NEGATIVE
Nitrite: NEGATIVE
PROTEIN: NEGATIVE
Ph: 6 (ref 4.5–8.0)
RBC,UR: 1 /HPF (ref 0–5)
SPECIFIC GRAVITY: 1.025 (ref 1.003–1.030)
Squamous Epithelial: 9

## 2013-10-28 ENCOUNTER — Ambulatory Visit: Payer: Self-pay | Admitting: Obstetrics and Gynecology

## 2013-10-29 ENCOUNTER — Other Ambulatory Visit: Payer: Self-pay | Admitting: Obstetrics and Gynecology

## 2013-10-29 DIAGNOSIS — N6452 Nipple discharge: Secondary | ICD-10-CM

## 2013-10-29 DIAGNOSIS — N6320 Unspecified lump in the left breast, unspecified quadrant: Secondary | ICD-10-CM

## 2013-11-10 ENCOUNTER — Ambulatory Visit
Admission: RE | Admit: 2013-11-10 | Discharge: 2013-11-10 | Disposition: A | Discharge status: Home / Self Care | Payer: 59 | Source: Ambulatory Visit | Attending: Obstetrics and Gynecology | Admitting: Obstetrics and Gynecology

## 2013-11-10 DIAGNOSIS — N6452 Nipple discharge: Secondary | ICD-10-CM

## 2013-11-10 DIAGNOSIS — N6320 Unspecified lump in the left breast, unspecified quadrant: Secondary | ICD-10-CM

## 2013-11-10 MED ORDER — GADOBENATE DIMEGLUMINE 529 MG/ML IV SOLN
15.0000 mL | Freq: Once | INTRAVENOUS | Status: AC | PRN
  Administered 2013-11-10: 15 mL via INTRAVENOUS

## 2013-12-10 ENCOUNTER — Ambulatory Visit: Payer: Self-pay

## 2013-12-10 LAB — PREGNANCY, URINE: Pregnancy Test, Urine: NEGATIVE m[IU]/mL

## 2013-12-10 LAB — COMPREHENSIVE METABOLIC PANEL
ALBUMIN: 3.9 g/dL (ref 3.4–5.0)
ANION GAP: 9 (ref 7–16)
Alkaline Phosphatase: 101 U/L
BUN: 15 mg/dL (ref 7–18)
Bilirubin,Total: 0.7 mg/dL (ref 0.2–1.0)
CALCIUM: 9.3 mg/dL (ref 8.5–10.1)
CREATININE: 0.8 mg/dL (ref 0.60–1.30)
Chloride: 104 mmol/L (ref 98–107)
Co2: 27 mmol/L (ref 21–32)
EGFR (Non-African Amer.): >60
GLUCOSE: 150 mg/dL — AB (ref 65–99)
Osmolality: 283 (ref 275–301)
POTASSIUM: 3.6 mmol/L (ref 3.5–5.1)
SGOT(AST): 76 U/L — ABNORMAL HIGH (ref 15–37)
SGPT (ALT): 83 U/L — ABNORMAL HIGH
Sodium: 140 mmol/L (ref 136–145)
Total Protein: 8.2 g/dL (ref 6.4–8.2)

## 2013-12-10 LAB — AMYLASE: Amylase: 42 U/L (ref 25–115)

## 2013-12-10 LAB — URINALYSIS, COMPLETE
BLOOD: NEGATIVE
GLUCOSE, UR: NEGATIVE mg/dL (ref 0–75)
Ketone: NEGATIVE
Leukocyte Esterase: NEGATIVE
NITRITE: NEGATIVE
PH: 6 (ref 4.5–8.0)

## 2013-12-10 LAB — CBC WITH DIFFERENTIAL/PLATELET
Basophil #: 0 10*3/uL (ref 0.0–0.1)
Basophil %: 0.7 %
Eosinophil #: 0.1 10*3/uL (ref 0.0–0.7)
Eosinophil %: 1.1 %
HCT: 41.6 % (ref 35.0–47.0)
HGB: 14.1 g/dL (ref 12.0–16.0)
LYMPHS PCT: 48.3 %
Lymphocyte #: 2.8 10*3/uL (ref 1.0–3.6)
MCH: 29.3 pg (ref 26.0–34.0)
MCHC: 33.8 g/dL (ref 32.0–36.0)
MCV: 87 fL (ref 80–100)
MONOS PCT: 8.7 %
Monocyte #: 0.5 x10 3/mm (ref 0.2–0.9)
Neutrophil #: 2.4 10*3/uL (ref 1.4–6.5)
Neutrophil %: 41.2 %
Platelet: 158 10*3/uL (ref 150–440)
RBC: 4.81 10*6/uL (ref 3.80–5.20)
RDW: 14.5 % (ref 11.5–14.5)
WBC: 5.9 10*3/uL (ref 3.6–11.0)

## 2013-12-10 LAB — LIPASE, BLOOD: LIPASE: 163 U/L (ref 73–393)

## 2013-12-10 LAB — OCCULT BLOOD X 1 CARD TO LAB, STOOL: Occult Blood, Feces: POSITIVE

## 2013-12-12 LAB — URINE CULTURE

## 2014-01-26 IMAGING — CR DG CHEST 2V
1 series · 2 of 2 positions shown · non-contrast
Comparison: Chest x-ray 06/25/2010.

CLINICAL DATA: Cough and congestion.

EXAM:
CHEST  2 VIEW

[Series 1: pa · 0.17mm/px · 2 of 2 slices shown]
[im 1/2]
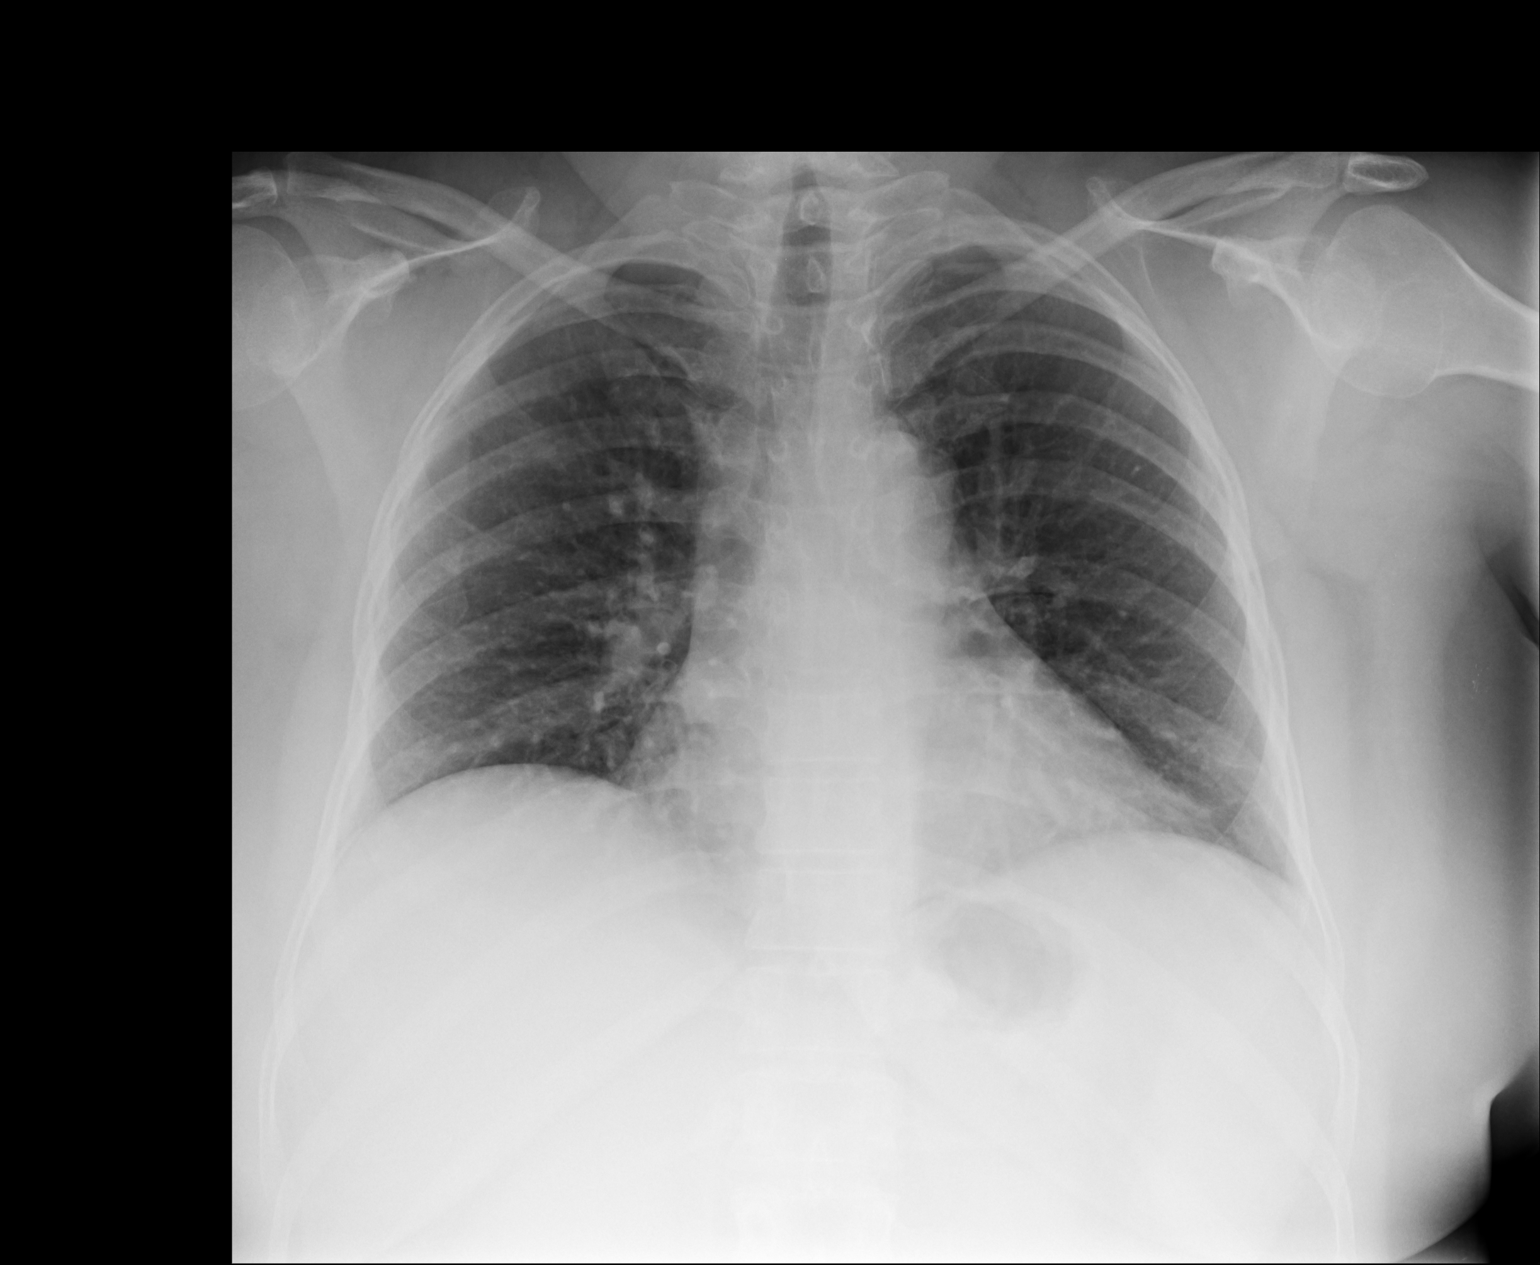
[im 2/2]
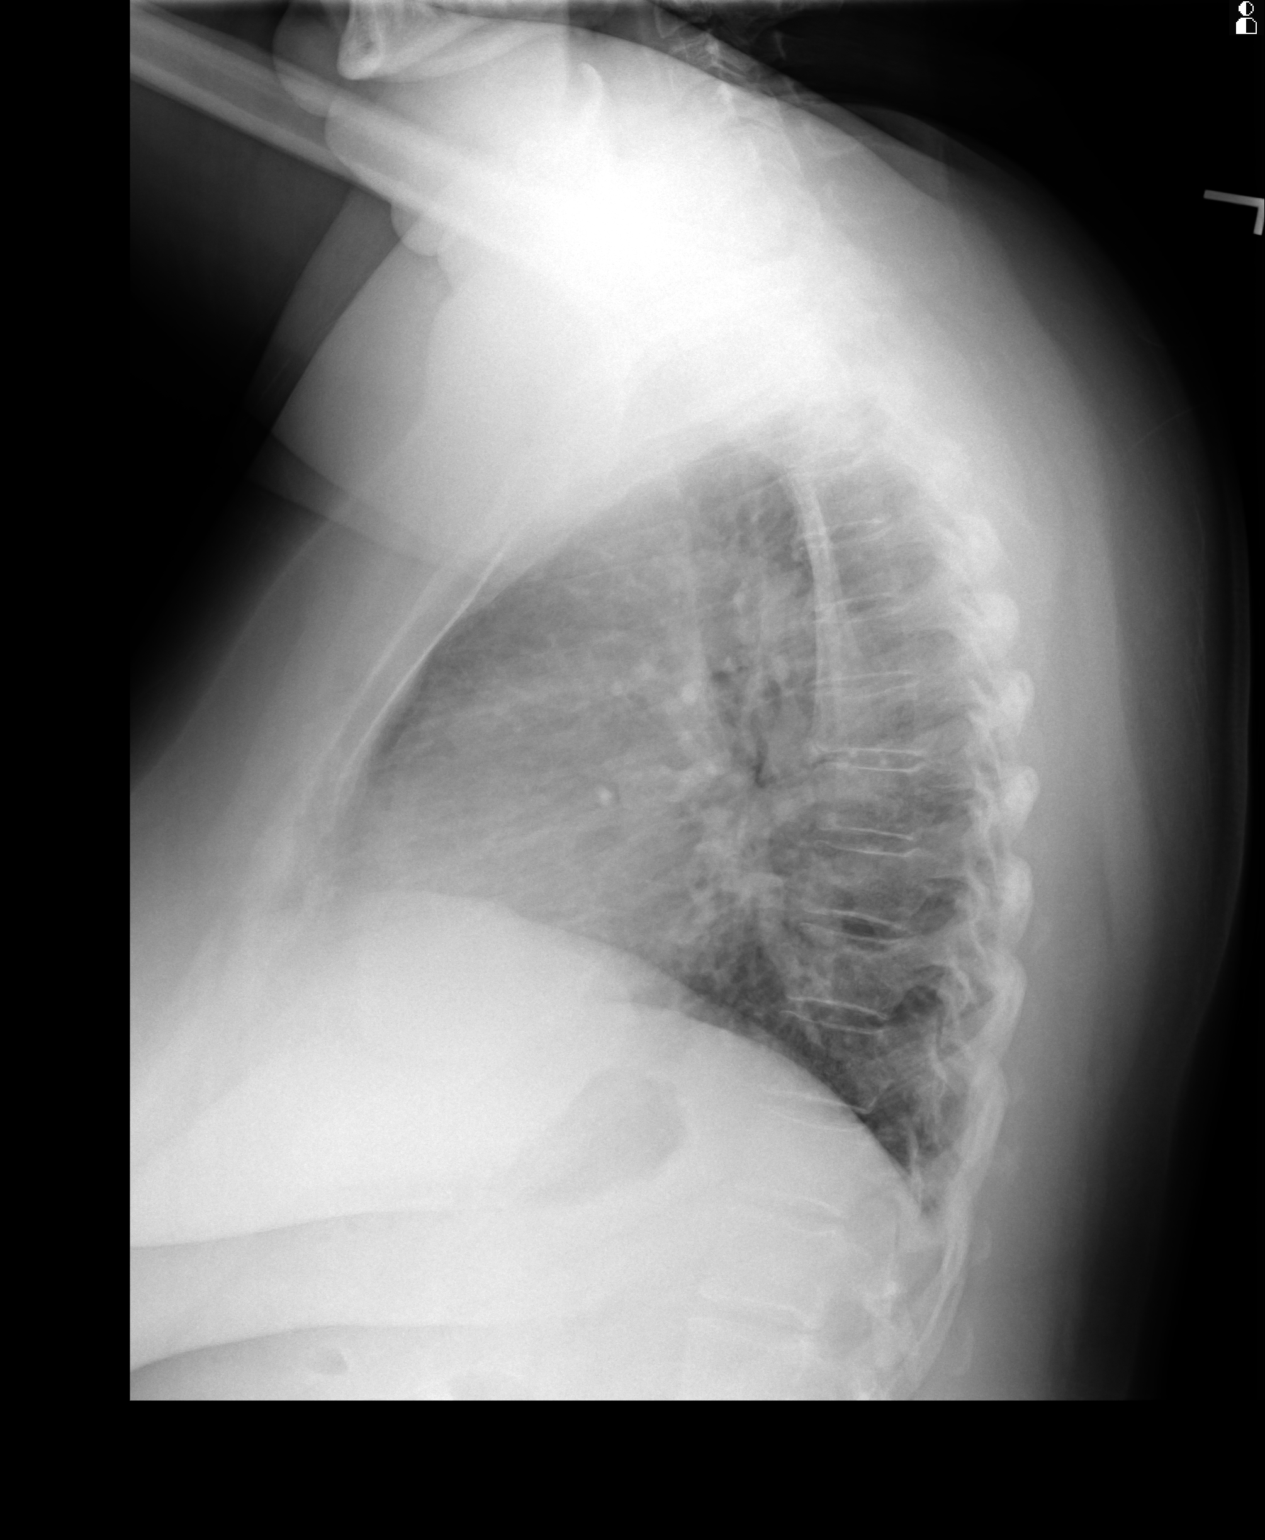

[2 of 2 positions shown; findings below may reference images not displayed]

FINDINGS: Multiple tiny calcified nodules scattered throughout the lungs
bilaterally, compatible with calcified granulomas. Lung volumes are
low. No consolidative airspace disease or pleural effusions.
Crowding of the pulmonary vasculature, accentuated by low lung
volumes, without frank pulmonary edema. Heart size is normal.
Mediastinal contours are unremarkable.
IMPRESSION: 1. Low lung volumes without radiographic evidence of acute
cardiopulmonary disease.
2. Sequela of old granulomatous disease redemonstrated, as above.

## 2014-01-30 ENCOUNTER — Ambulatory Visit: Payer: Self-pay | Admitting: Emergency Medicine

## 2014-01-30 LAB — URINALYSIS, COMPLETE
Bilirubin,UR: NEGATIVE
Blood: NEGATIVE
Glucose,UR: >=1000
Ketone: NEGATIVE
Leukocyte Esterase: NEGATIVE
Nitrite: NEGATIVE
Ph: 7 (ref 5.0–8.0)
Protein: NEGATIVE
RBC,UR: NONE SEEN /HPF (ref 0–5)
Specific Gravity: 1.02 (ref 1.000–1.030)

## 2014-03-11 ENCOUNTER — Ambulatory Visit: Payer: Self-pay

## 2014-04-13 IMAGING — CT CT HEAD WITHOUT CONTRAST
1 series · 16 of 30 positions shown, 20 images · non-contrast
Comparison: none

REASON FOR EXAM: head injury dizziness
COMMENTS:

[Series 2: soft tissue · axial · 0.42mm/px · z∈[-190,-54]mm · 16 of 30 slices shown, 20 images]
[im 2/30  brain]
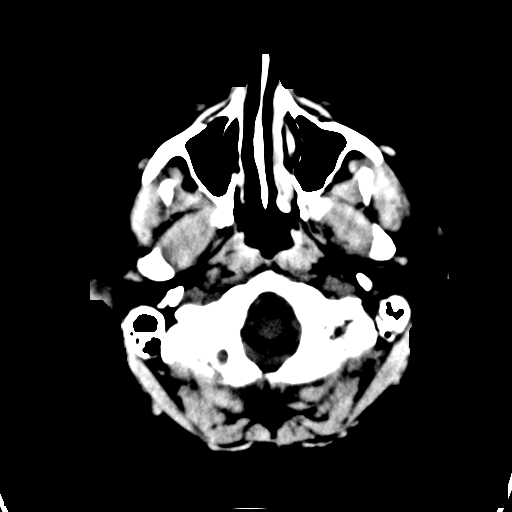
[im 2/30  bone]
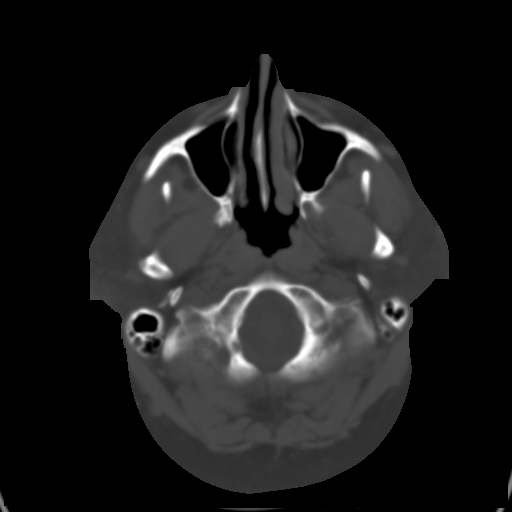
[im 4/30  brain]
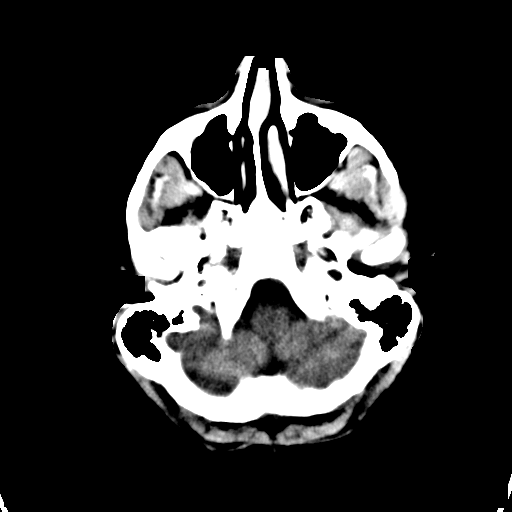
[im 6/30  brain]
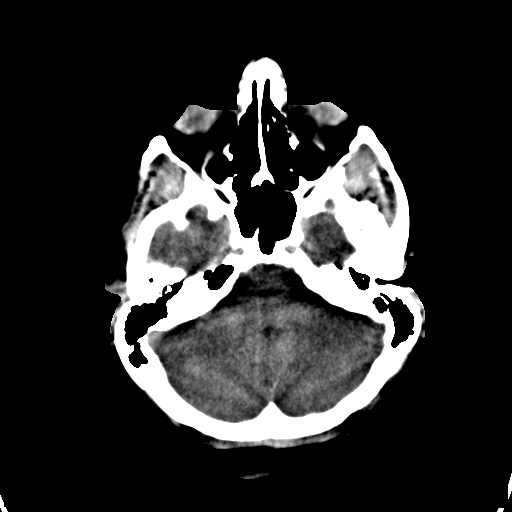
[im 8/30  brain]
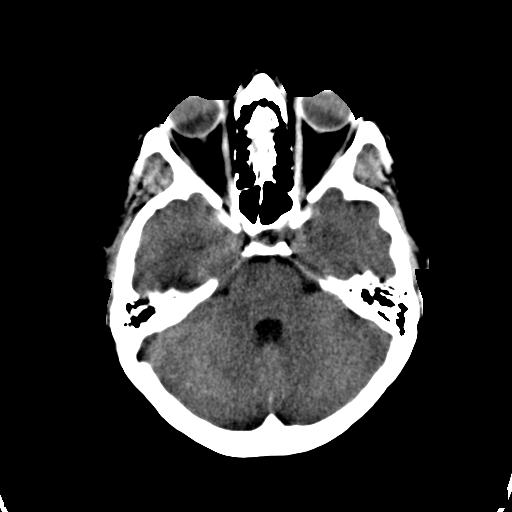
[im 9/30  brain]
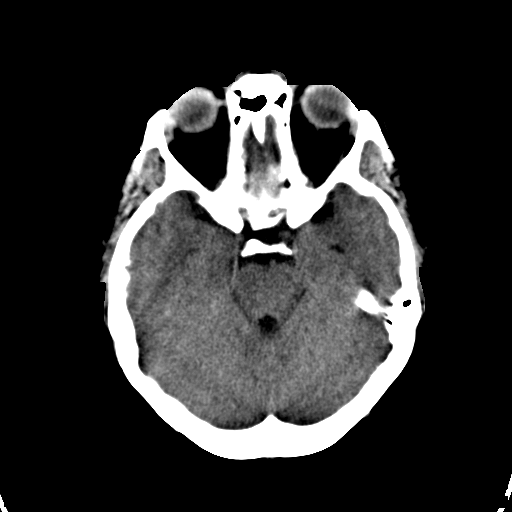
[im 9/30  bone]
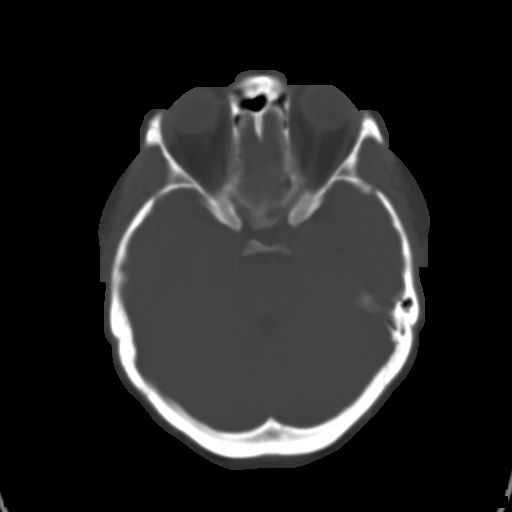
[im 11/30  brain]
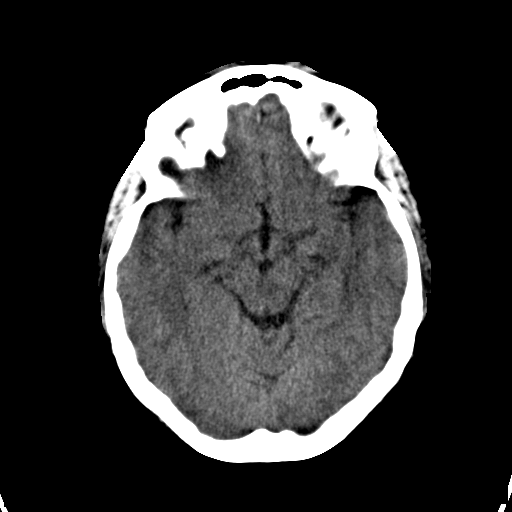
[im 13/30  brain]
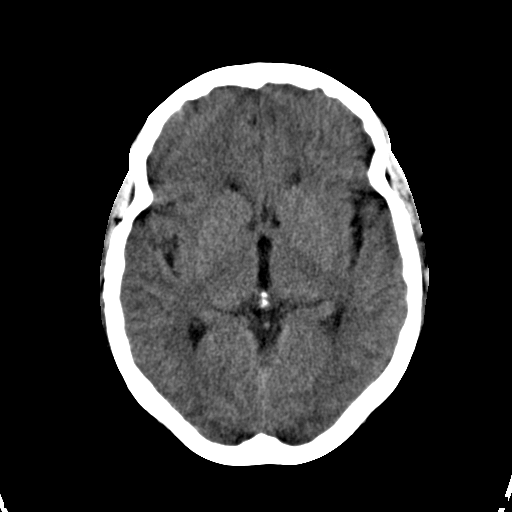
[im 15/30  brain]
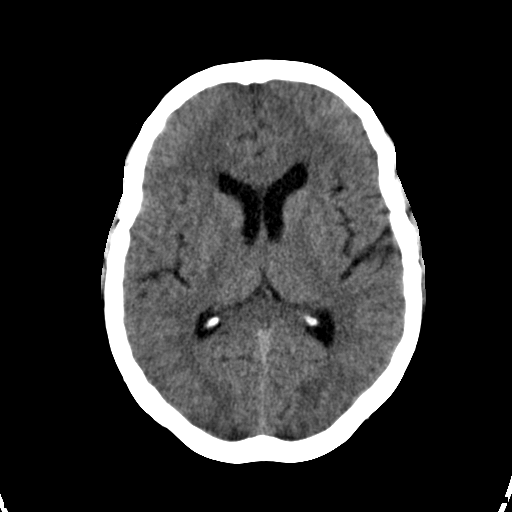
[im 16/30  brain]
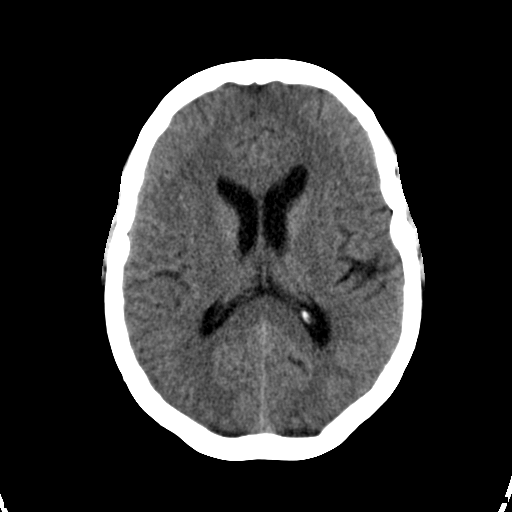
[im 16/30  bone]
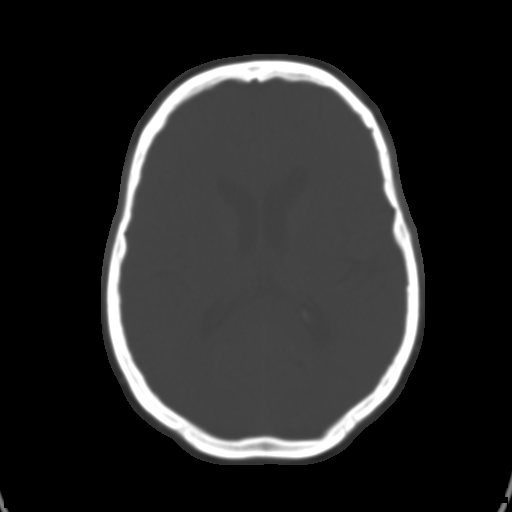
[im 18/30  brain]
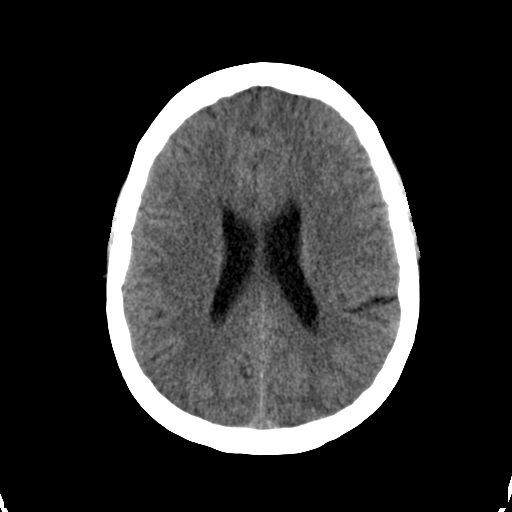
[im 20/30  brain]
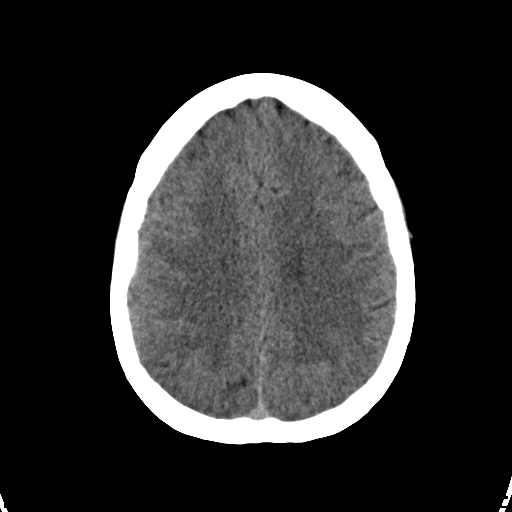
[im 22/30  brain]
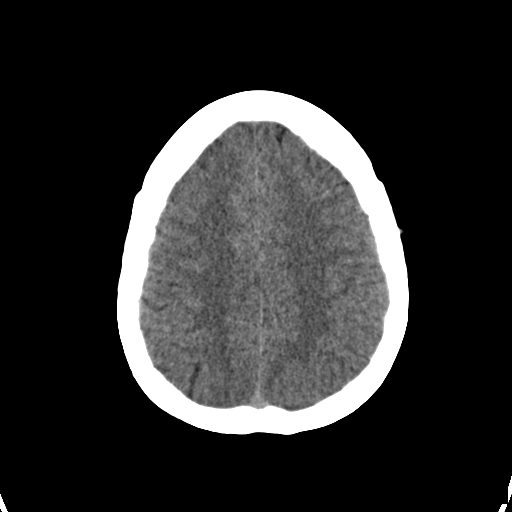
[im 23/30  brain]
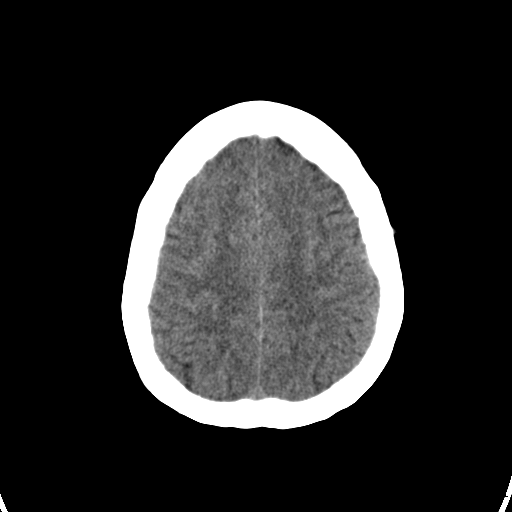
[im 23/30  bone]
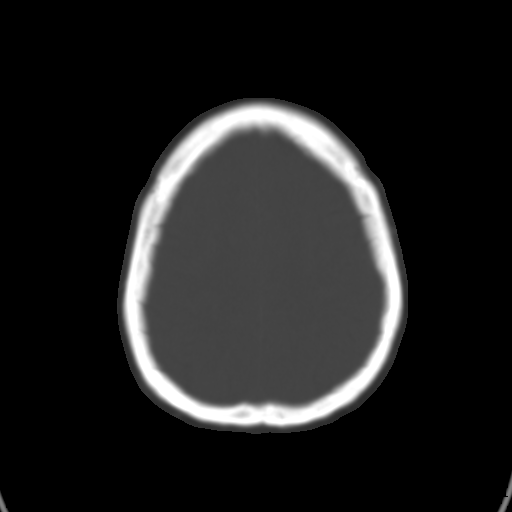
[im 25/30  brain]
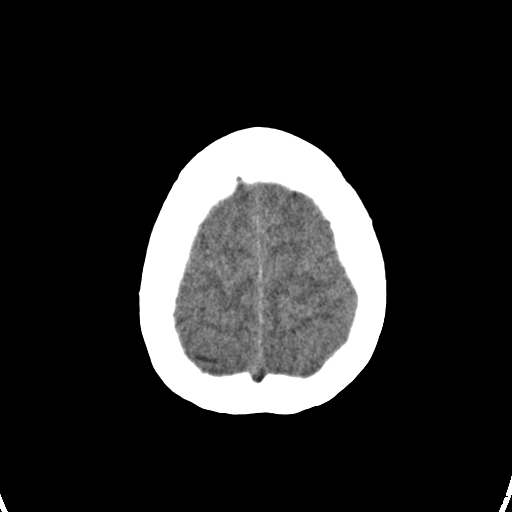
[im 27/30  brain]
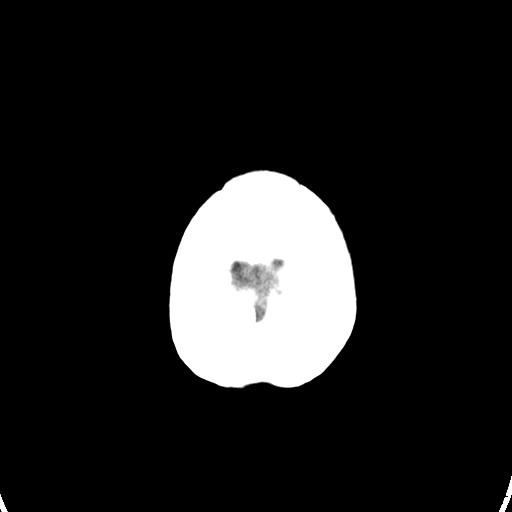
[im 29/30  brain]
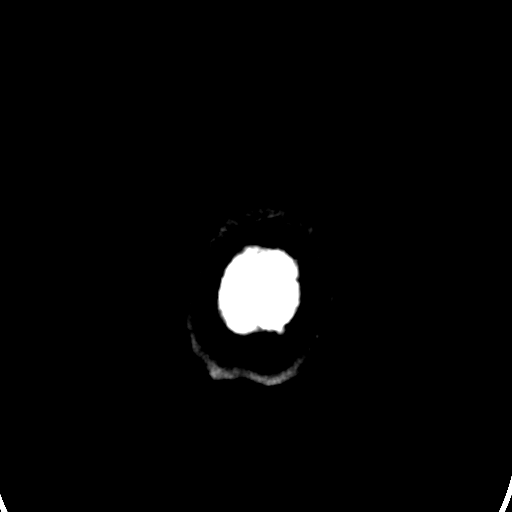

[16 of 30 positions shown; findings below may reference images not displayed]

PROCEDURE:     CT  - CT HEAD WITHOUT CONTRAST  - June 08, 2012  [DATE]

RESULT:     Noncontrast emergent CT of the brain is compared to the previous
examination 05 September, 2011. The ventricles and sulci are normal. There is
no hemorrhage. There is no focal mass, mass-effect or midline shift. There
is no evidence of edema or territorial infarct. The bone windows demonstrate
normal aeration of the paranasal sinuses and mastoid air cells. There is no
skull fracture demonstrated.
IMPRESSION: 1. No acute intracranial abnormality.

[REDACTED]

## 2014-04-13 IMAGING — CT CT CHEST W/O CM
1 series · 16 of 34 positions shown, 20 images · non-contrast
Comparison: none

REASON FOR EXAM: TRAUMA LEFT CHEST PAIN NECK PAIN
COMMENTS:

PROCEDURE:     CT  - CT CHEST WITHOUT CONTRAST  - June 08, 2012  [DATE]
RESULT:
TECHNIQUE: CT of the chest without contrast is reconstructed at 3 mm slice
thickness in the axial plane.
Comparison is made to previous CT of the chest dated 05 September, 2011.

[Series 2: soft tissue · axial · 0.72mm/px · z∈[-511,-265]mm · 16 of 93 slices shown, 20 images]
[im 7/93  mediastinal]
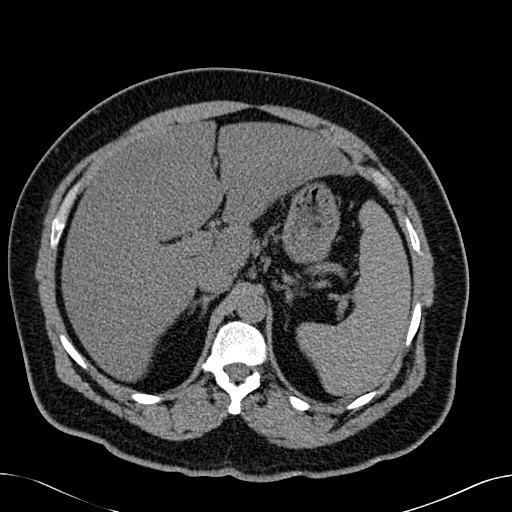
[im 7/93  lung]
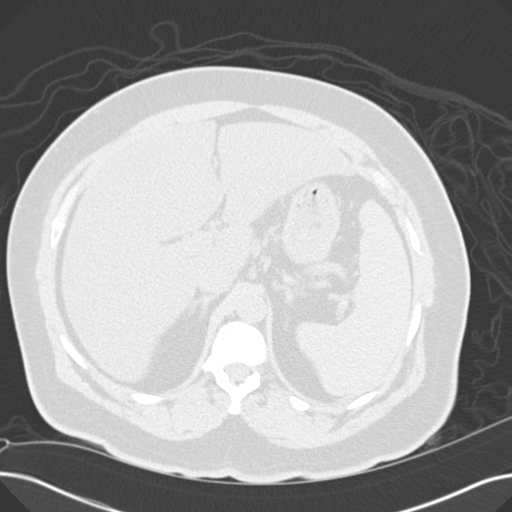
[im 14/93  lung]
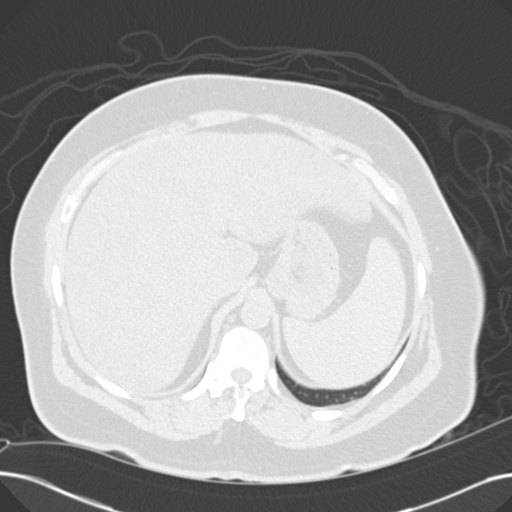
[im 19/93  lung]
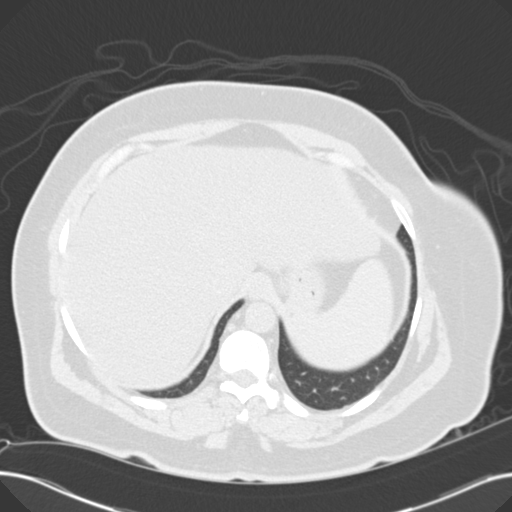
[im 24/93  lung]
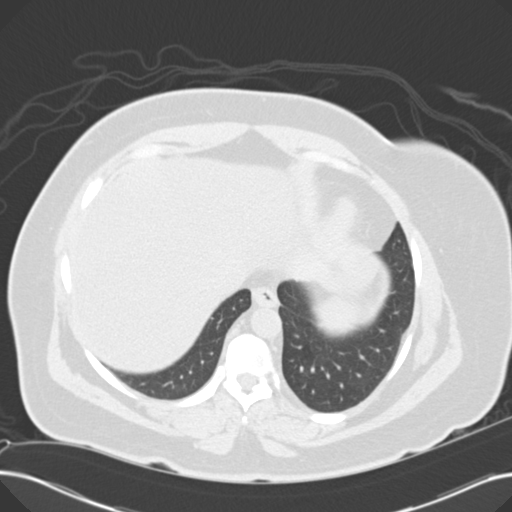
[im 31/93  mediastinal]
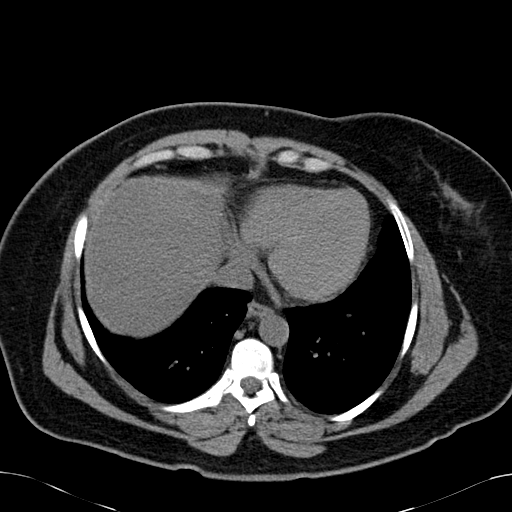
[im 31/93  lung]
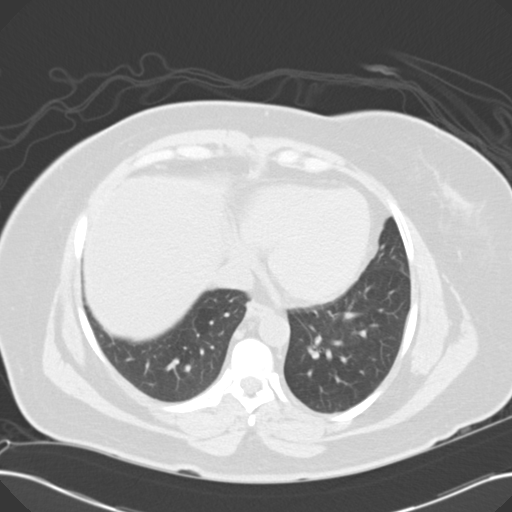
[im 37/93  lung]
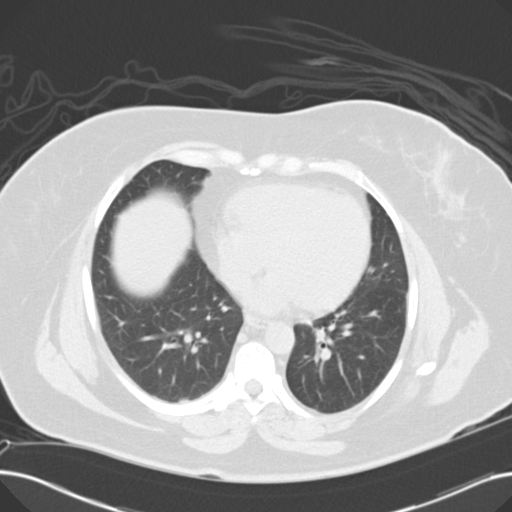
[im 41/93  lung]
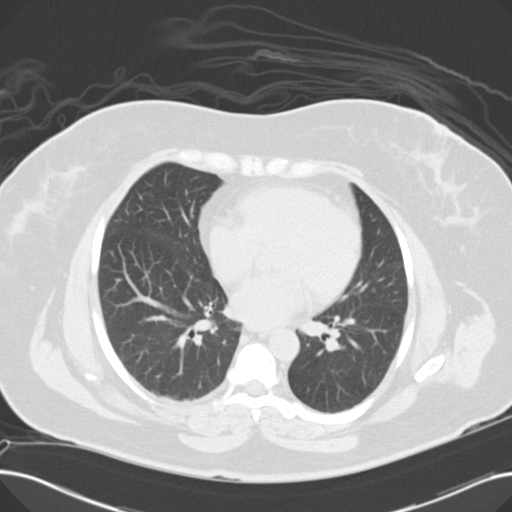
[im 45/93  lung]
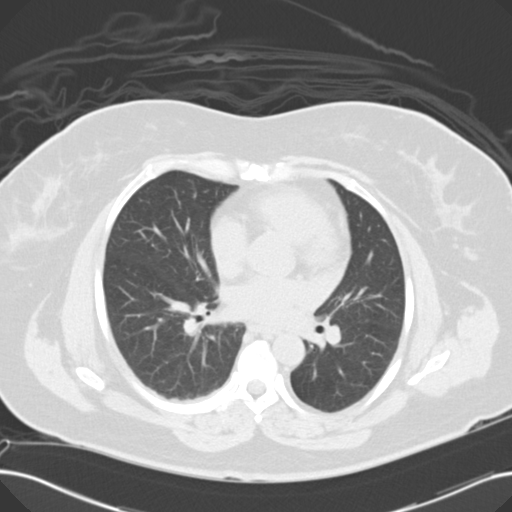
[im 50/93  mediastinal]
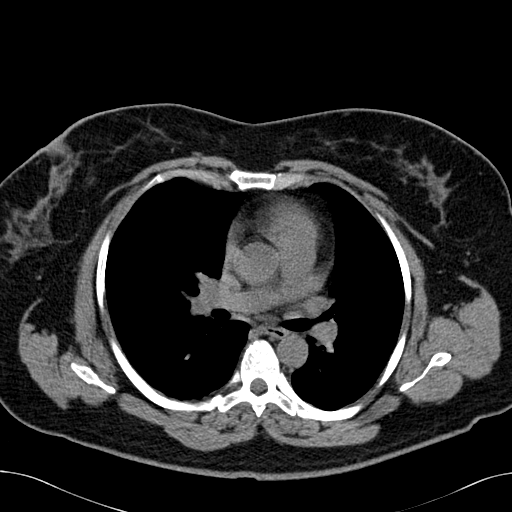
[im 50/93  lung]
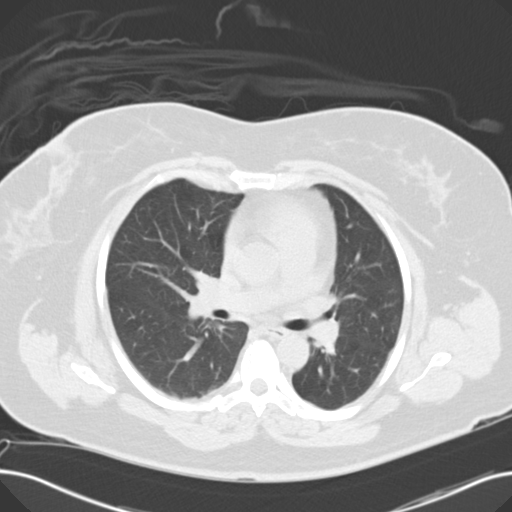
[im 55/93  lung]
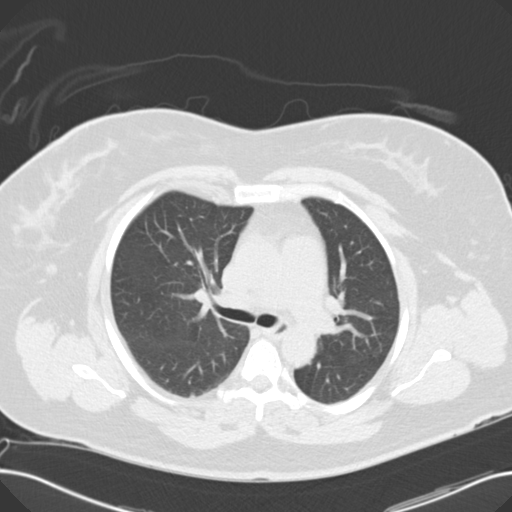
[im 58/93  lung]
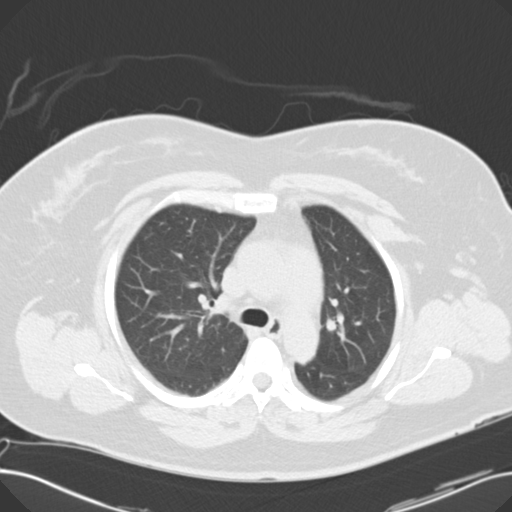
[im 65/93  lung]
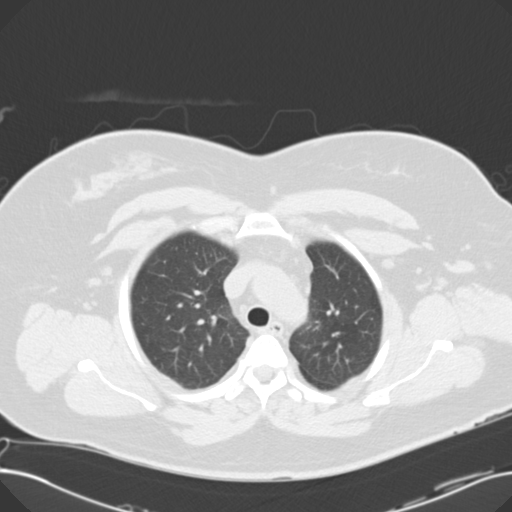
[im 72/93  mediastinal]
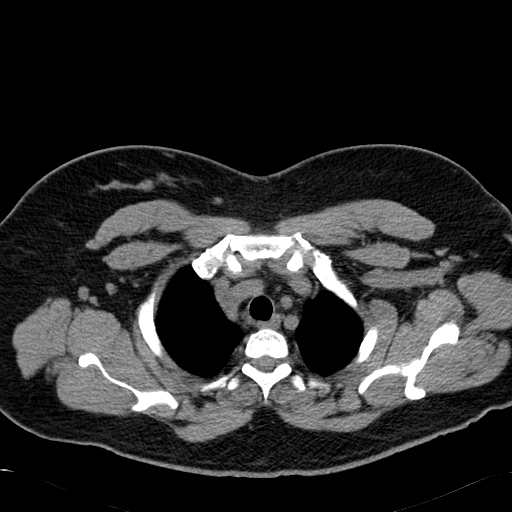
[im 72/93  lung]
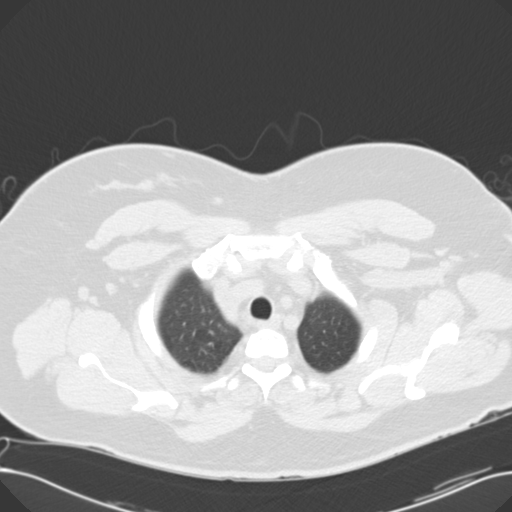
[im 75/93  lung]
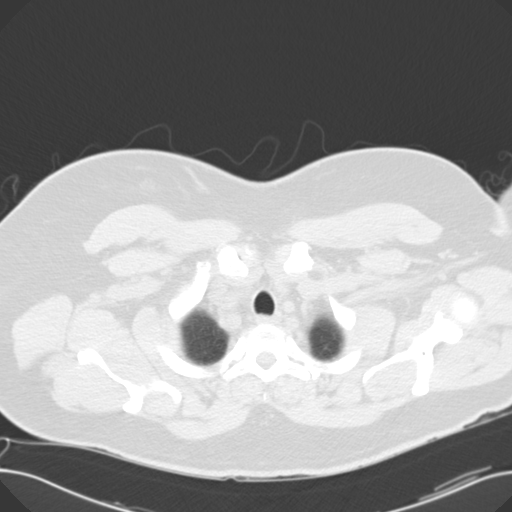
[im 82/93  lung]
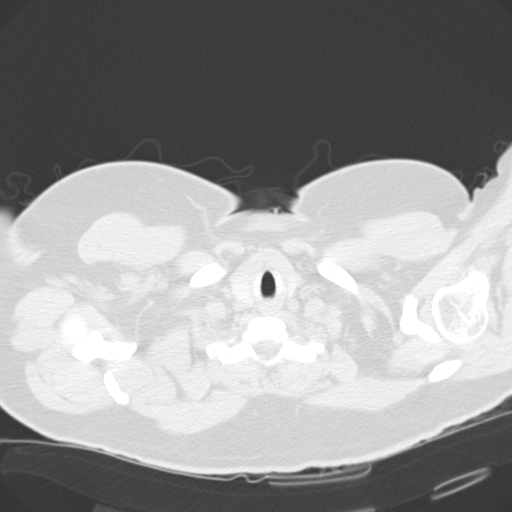
[im 89/93  lung]
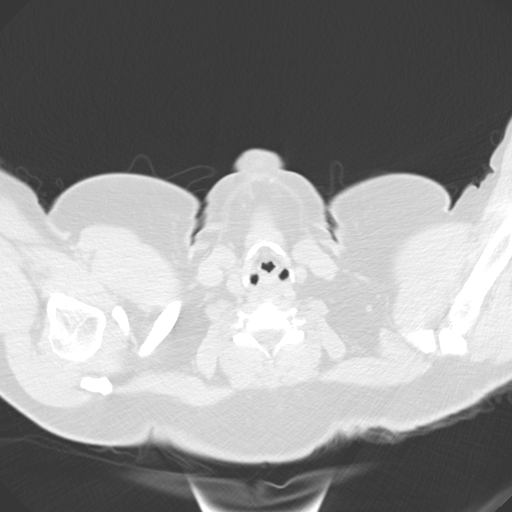

[16 of 34 positions shown; findings below may reference images not displayed]

FINDINGS: The included upper abdominal structures appear unremarkable. There
is no pleural or pericardial effusion. There is no pneumomediastinum or
pneumothorax. There is no infiltrate, pulmonary contusion or pulmonary
parenchymal mass. The chest wall appears intact. The bony structures appear
unremarkable. There is a tiny, calcified granuloma in the right lower lobe
paraspinal region on image 64 which was present previously.
IMPRESSION: Unremarkable CT of the chest without contrast. No acute
abnormality.

[REDACTED]

## 2014-06-26 IMAGING — CR SACRUM AND COCCYX - 2+ VIEW
1 series · 3 of 3 positions shown · non-contrast
Comparison: None.

CLINICAL DATA: Fell down stairs.  Tailbone pain.

EXAM:
SACRUM AND COCCYX - 2+ VIEW

[Series 1: t sacrum ap · 0.14mm/px · 3 of 3 slices shown]
[im 1/3]
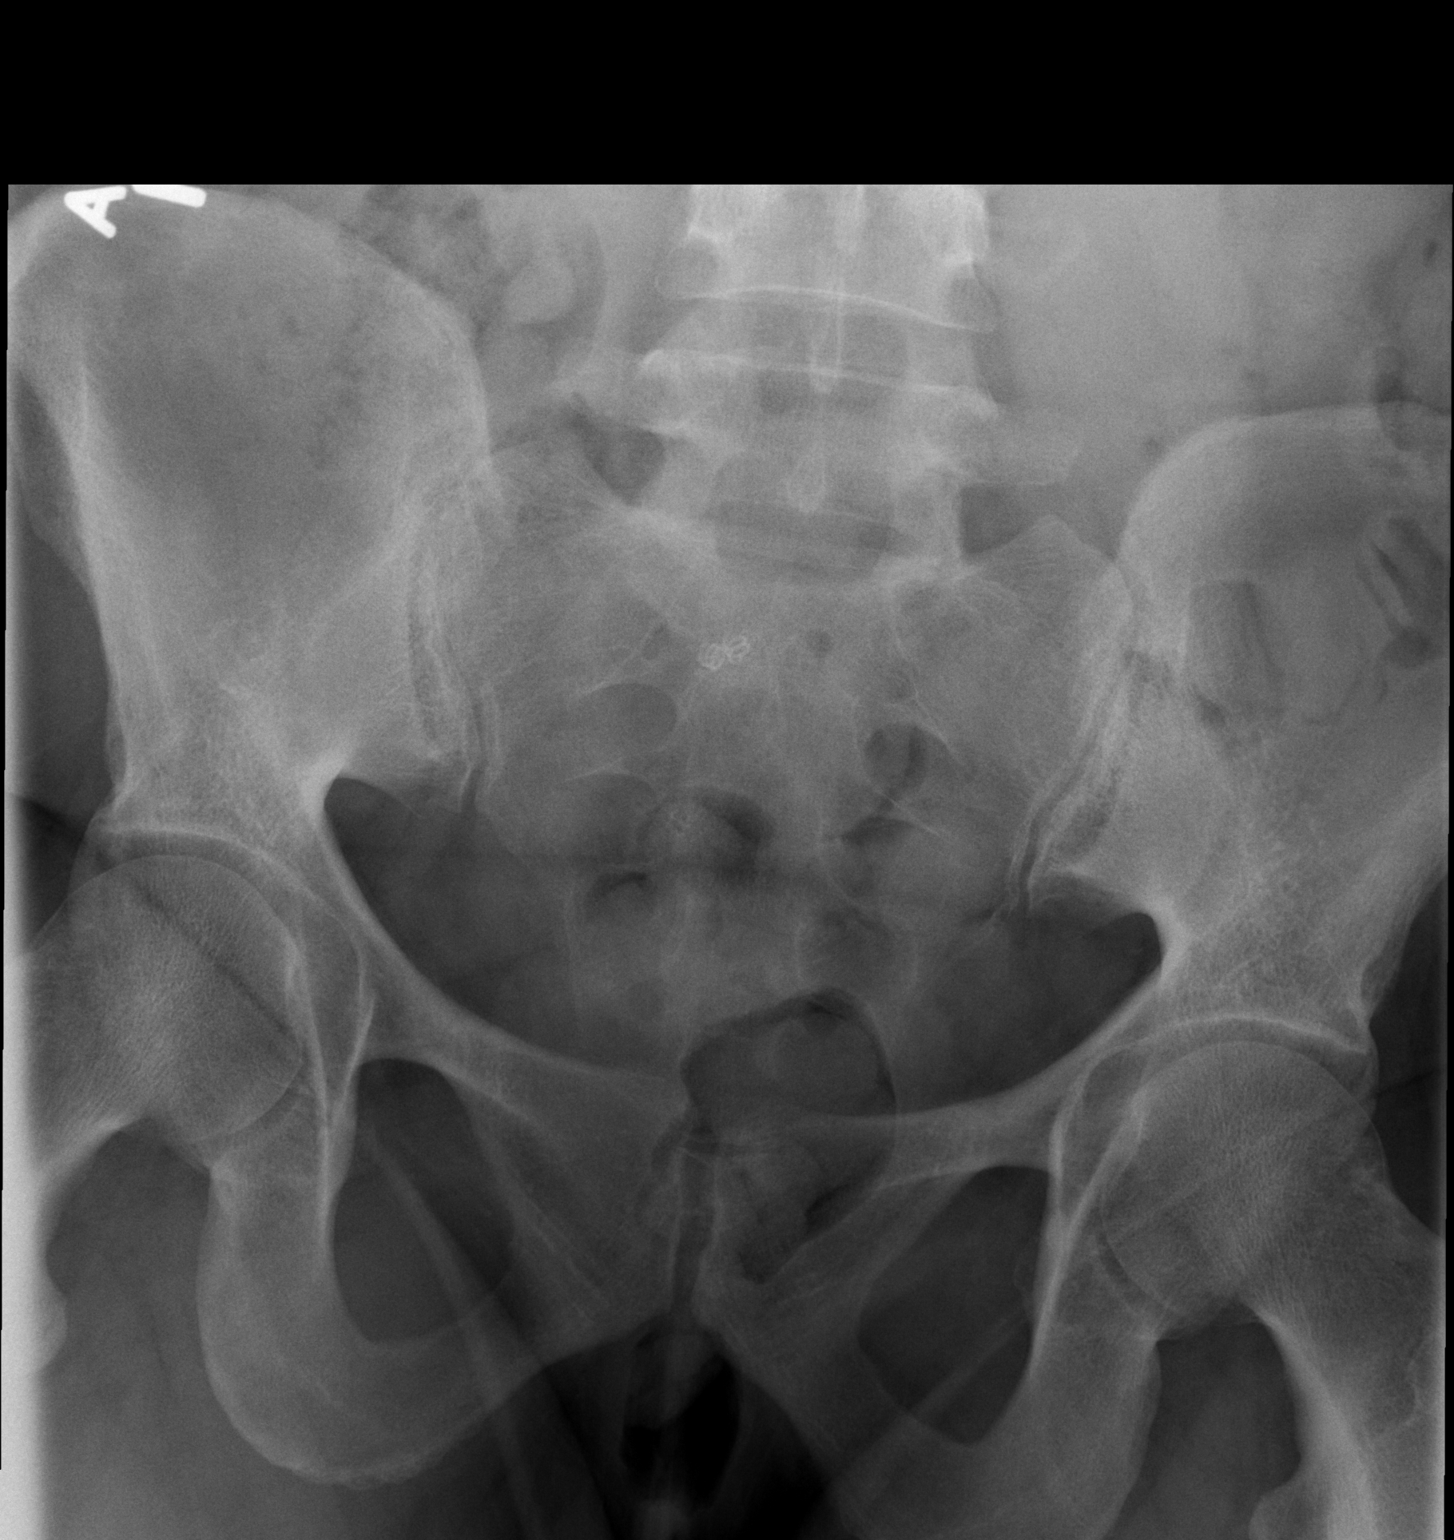
[im 2/3]
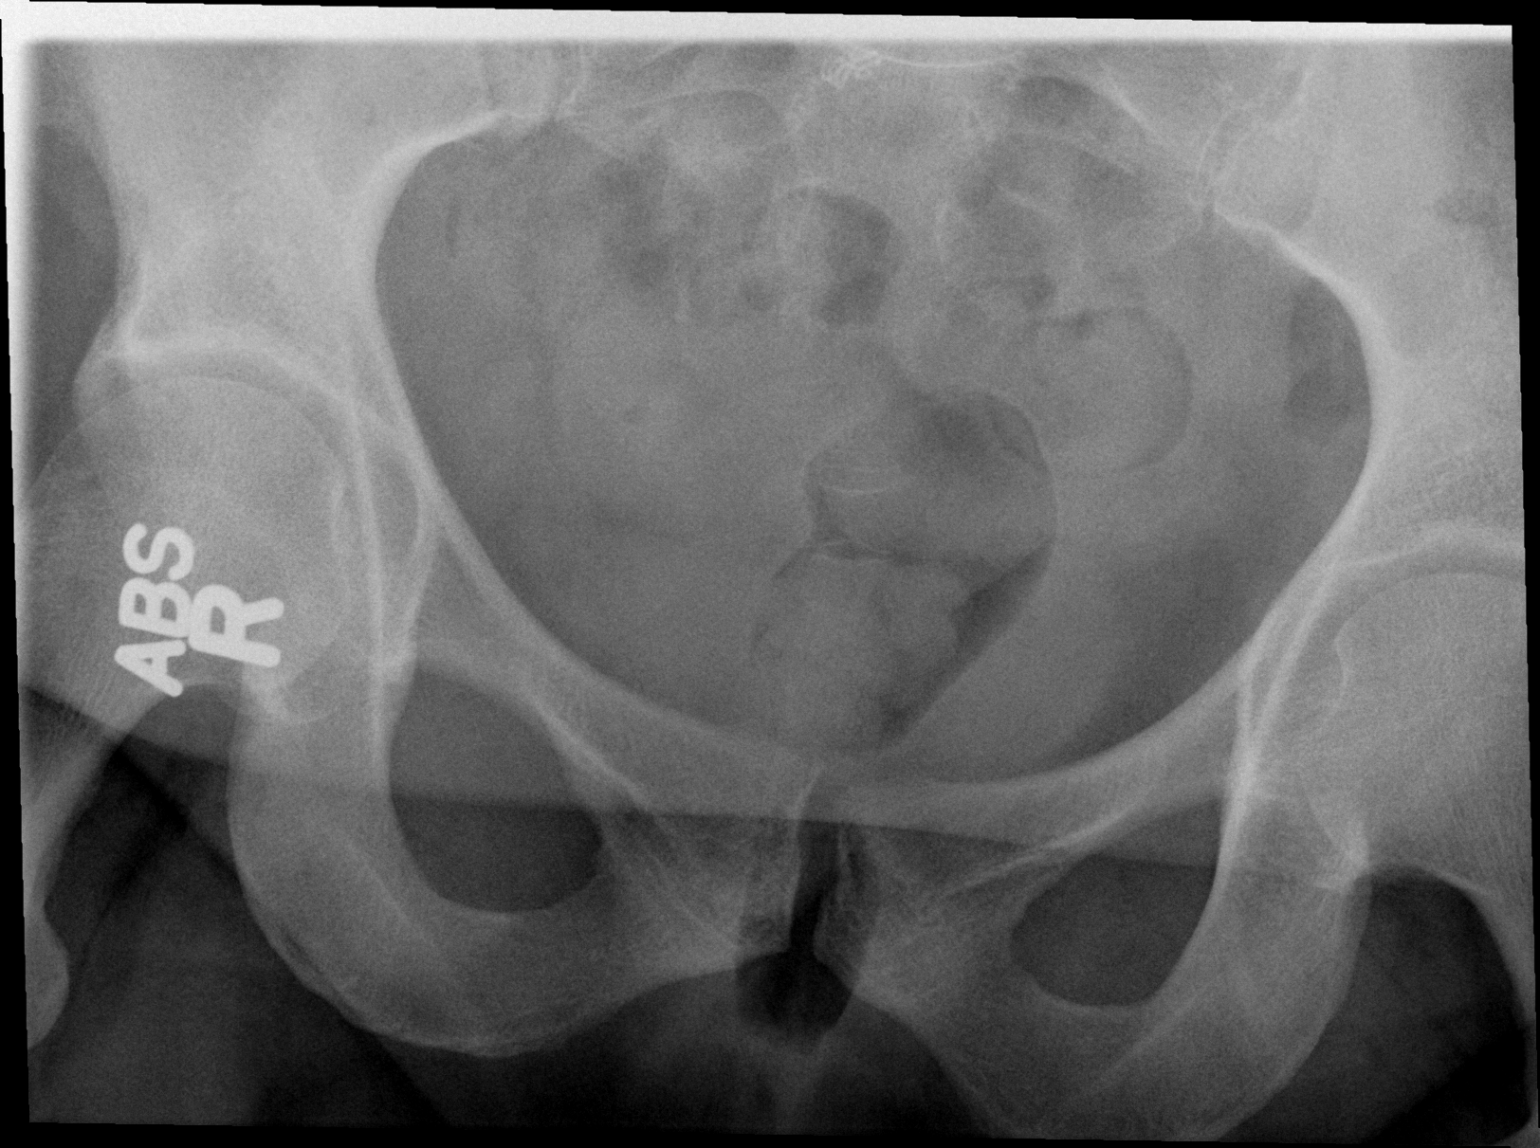
[im 3/3]
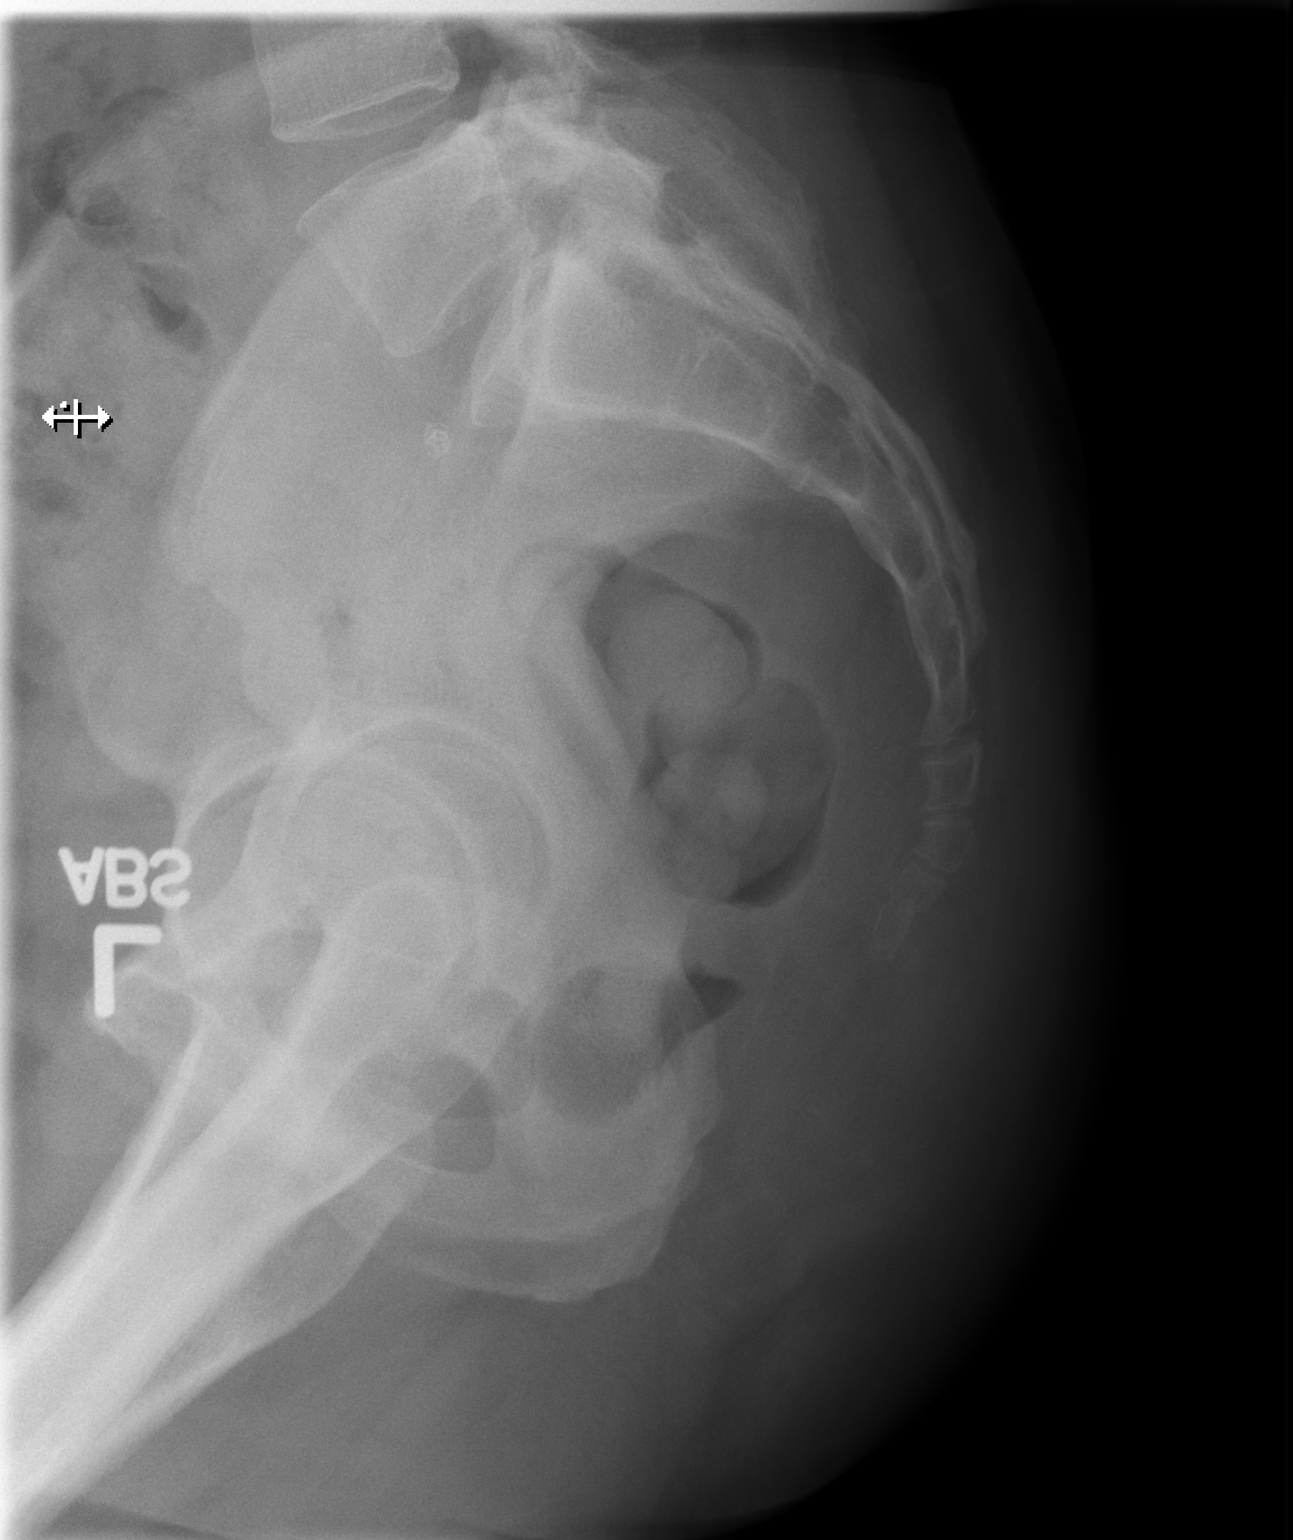

[3 of 3 positions shown; findings below may reference images not displayed]

FINDINGS: There is no evidence of fracture or other focal bone lesions
IMPRESSION: Negative.

## 2014-07-09 IMAGING — US US BREAST*L* LIMITED INC AXILLA
1 series · 4 of 4 positions shown · non-contrast
Comparison: 10/08/2012, 01/23/2010

CLINICAL DATA: The patient describes pain in the 12 o'clock
position of the right breast, pain in the 3 o'clock position of the
right breast, occasional spontaneous clear or milky discharge from
the right breast over the course of the past 3 months, and
occasional Roqea Nor crusting on the left nipple which she
describes as bloody discharge occurring at least once a week for the
past 3 months

EXAM:
DIGITAL DIAGNOSTIC  BILATERAL MAMMOGRAM WITH CAD
ULTRASOUND BILATERAL BREAST

[Series 1: us breast*left* limited inc axilla · 0.08mm/px · 4 of 4 slices shown]
[im 1/4]
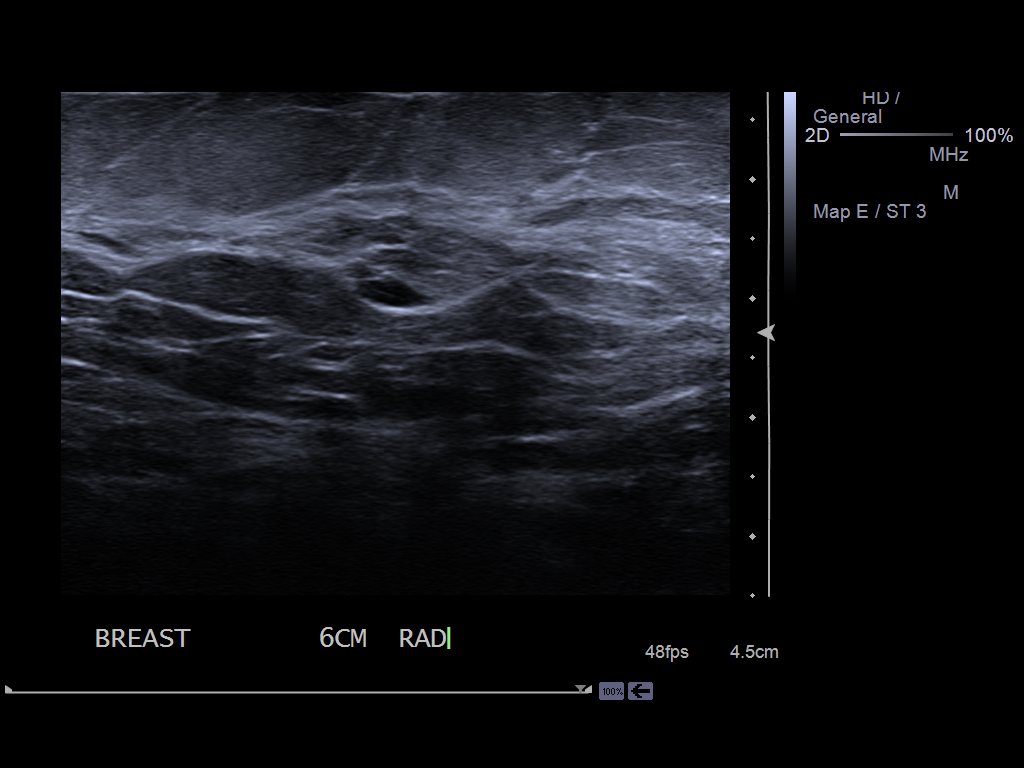
[im 2/4]
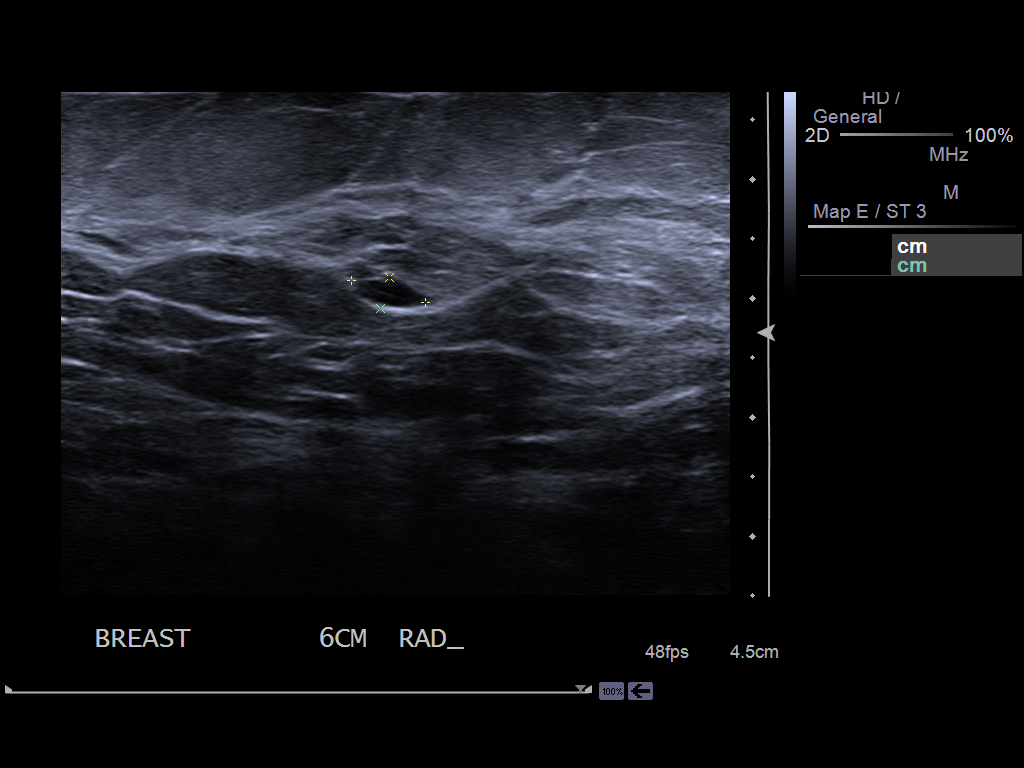
[im 3/4]
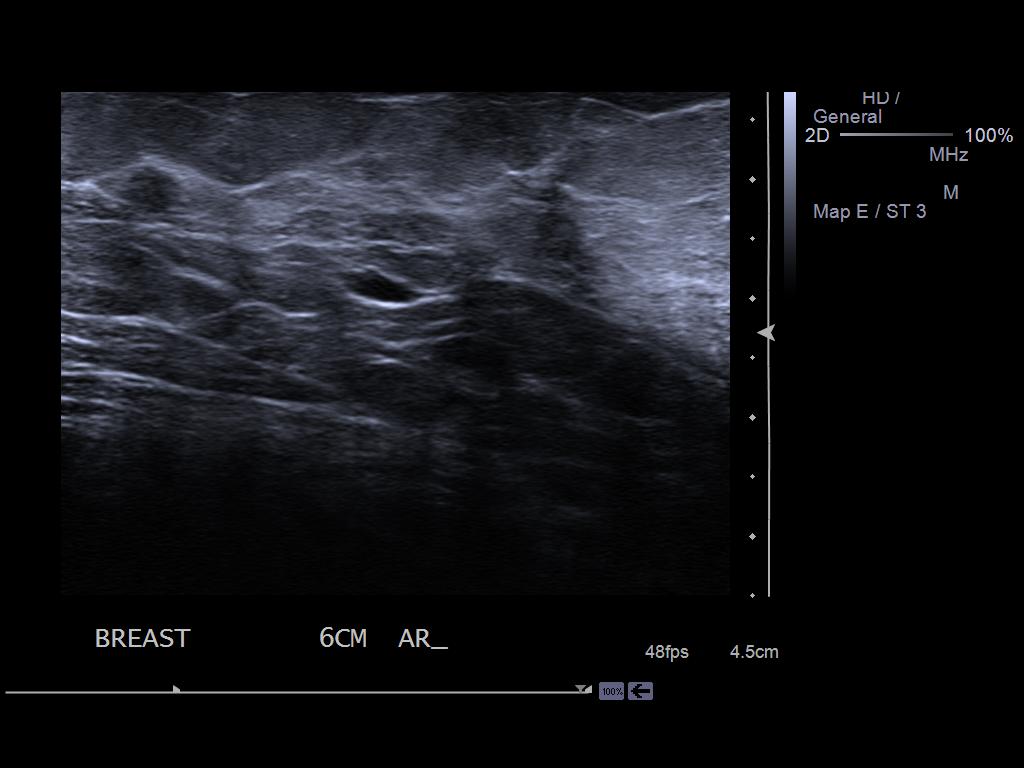
[im 4/4]
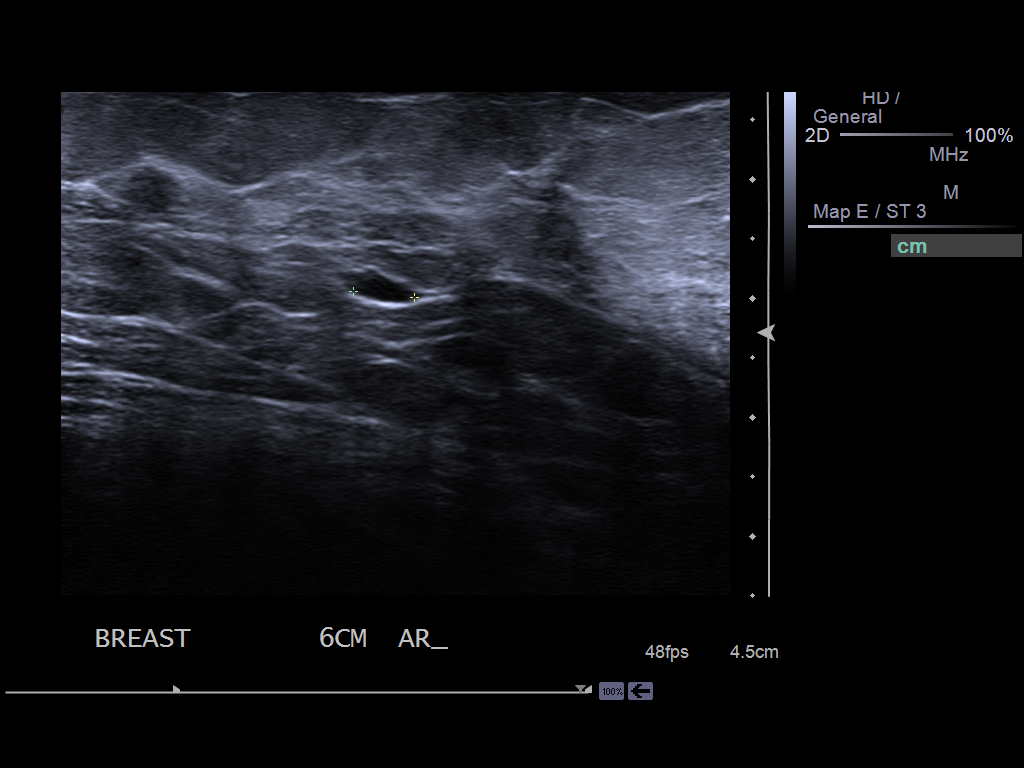

[4 of 4 positions shown; findings below may reference images not displayed]

ACR Breast Density Category b: There are scattered areas of
fibroglandular density.
FINDINGS: There are no mammographic abnormalities on the right.

On the left, there is a new circumscribed 6 mm oval isodense mass
upper inner quadrant middle third depth.

Mammographic images were processed with CAD.

On physical exam, there are no palpable abnormalities on either
side. No discharge could be obtained with compression today. No
dilated duct orifice is identified on either nipple.

Ultrasound is performed, showing normal glandular tissue in the 12
o'clock position of the right breast. There are no abnormalities in
the 3 o'clock position of the right breast. There is minimal
subareolar duct ectasia with no intraductal mass on the right.

On the left, the mammographic nodule corresponds to a simple cyst
measuring 7 x 3 x 5 mm located in the 10 o'clock position 6 cm from
the nipple. There is mild subareolar duct ectasia with no
intraductal mass.
IMPRESSION: No suspicious findings on either side. No findings identified to
account for nipple discharge. The patient is concerned about bloody
left nipple discharge, and clear right nipple discharge, although no
discharge could be obtained today. The patient does not appear to be
a candidate for ductography at this time given the absence of
current discharge and the absence of duct orifice dilatation.

RECOMMENDATION:
Bilateral breast MRI with contrast is recommended to evaluate for
intermittent spontaneous nipple discharge. Breast MRI can be
scheduled at [REDACTED] location. The
patient stated that she would contact her referring physician by
11/02/2013 to discuss this recommendation.

I have discussed the findings and recommendations with the patient.
Results were also provided in writing at the conclusion of the
visit. If applicable, a reminder letter will be sent to the patient
regarding the next appointment.

BI-RADS CATEGORY  0: Incomplete. Need additional imaging evaluation
and/or prior mammograms for comparison.

## 2014-07-22 IMAGING — MR MR BREAST BILATERAL W WO CONTRAST
6 of 13 series · 24 of 48 positions shown · IV contrast (15ml multihance)
Comparison: Previous mammograms from [REDACTED] dated 10/28/2013, 10/08/2012.

CLINICAL DATA: History of intermittent bilateral spontaneous nipple
discharge. Breast MRI requested.

LABS:  BUN and creatinine were obtained on site at [HOSPITAL]
[HOSPITAL].
Results:  BUN 13 mg/dL,  Creatinine 0.8 mg/dL.
EXAM:
BILATERAL BREAST MRI WITH AND WITHOUT CONTRAST
TECHNIQUE: Multiplanar, multisequence MR images of both breasts were obtained
prior to and following the intravenous administration of 15ml of
MultiHance

[Series 2: T2 · axial · 3.0mm · 0.49mm/px · z∈[-73,+89]mm · 3 of 55 slices shown]
[im 1/55]
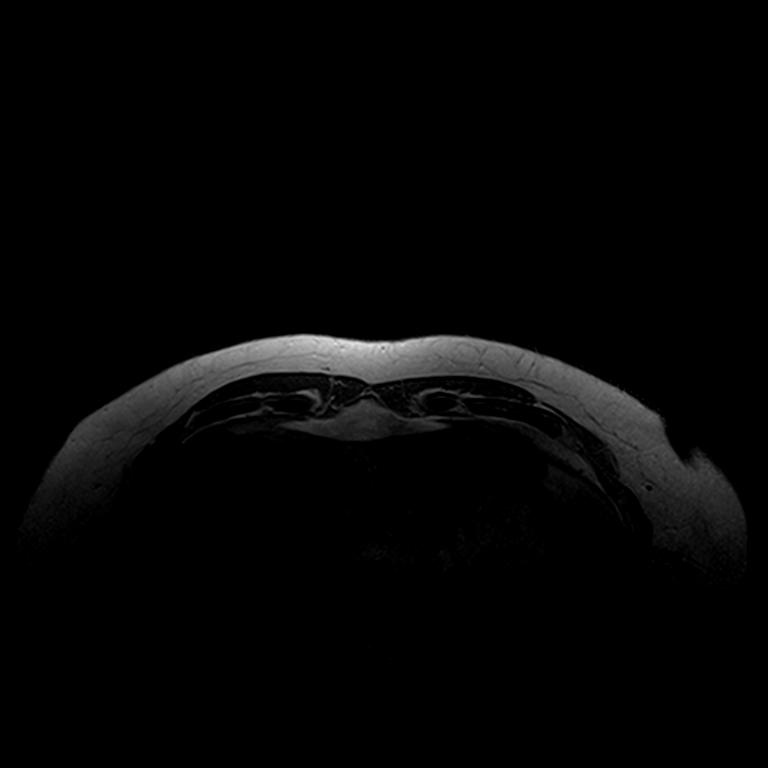
[im 28/55]
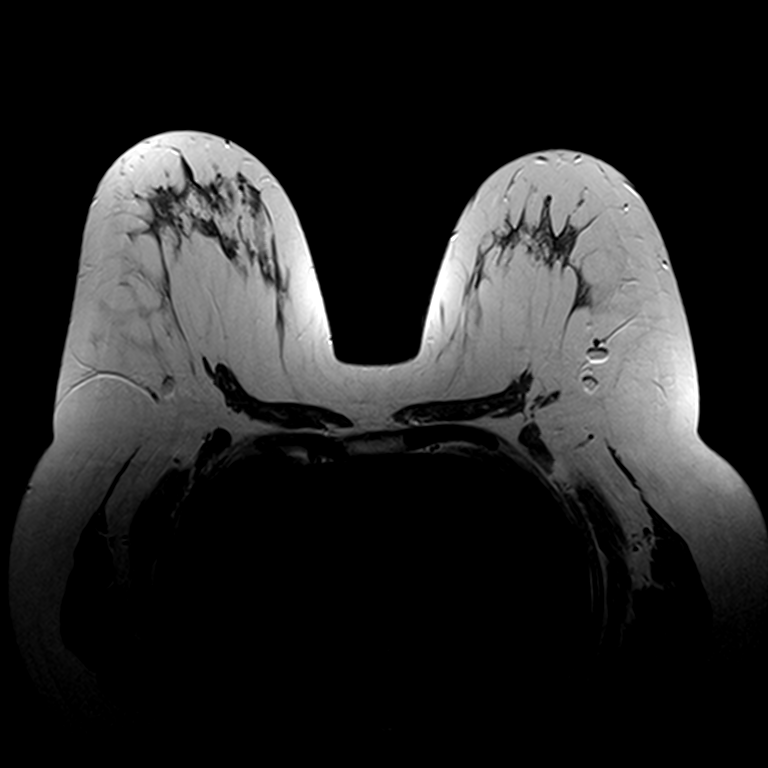
[im 55/55]
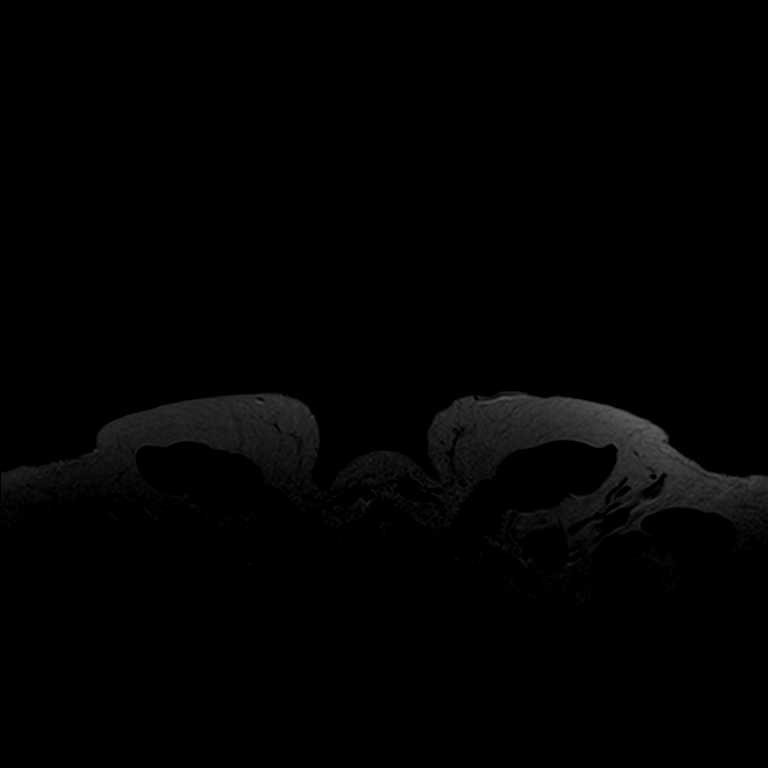

[Series 3: fl3d pre-cm no · axial · non-contrast · 1.2mm · 0.99mm/px · z∈[-78,+94]mm · 5 of 144 slices shown]
[im 1/144]
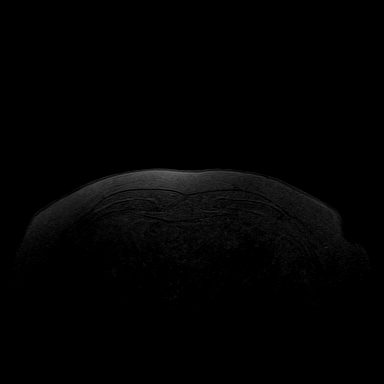
[im 36/144]
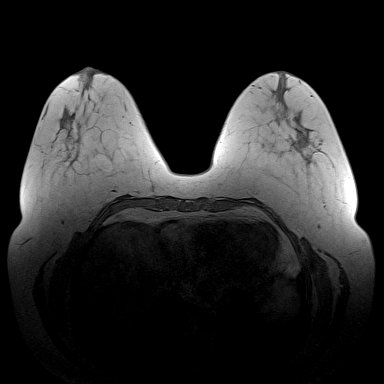
[im 72/144]
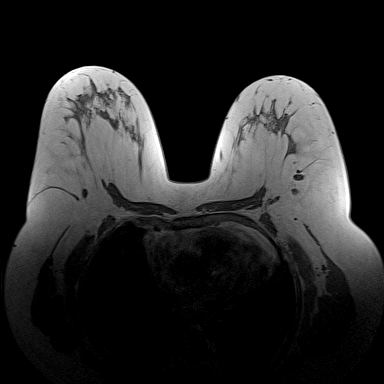
[im 108/144]
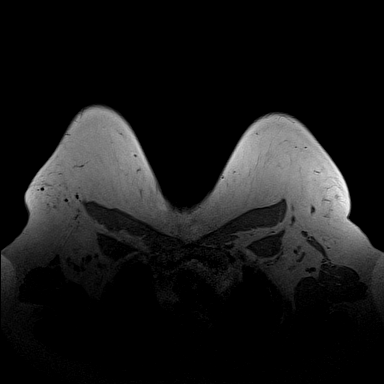
[im 144/144]
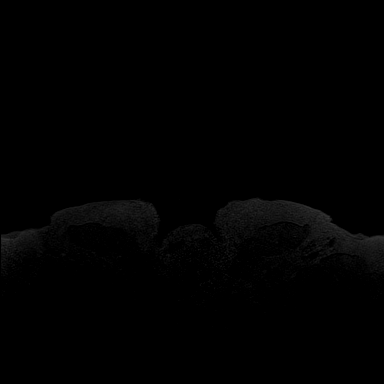

[Series 4: t2_tirm_tra ipat (a-p) · axial · 3.0mm · 0.74mm/px · z∈[-73,+89]mm · 2 of 55 slices shown]
[im 1/55]
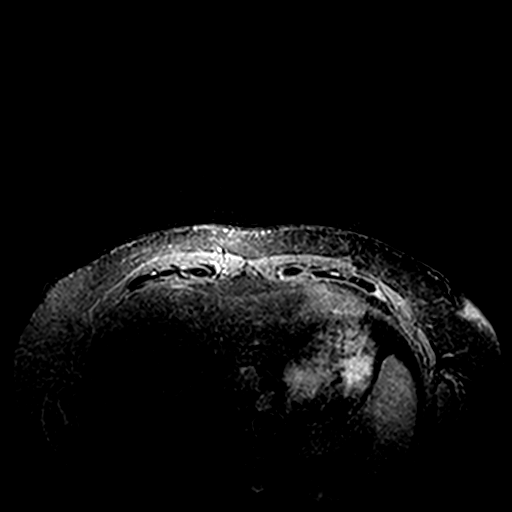
[im 55/55]
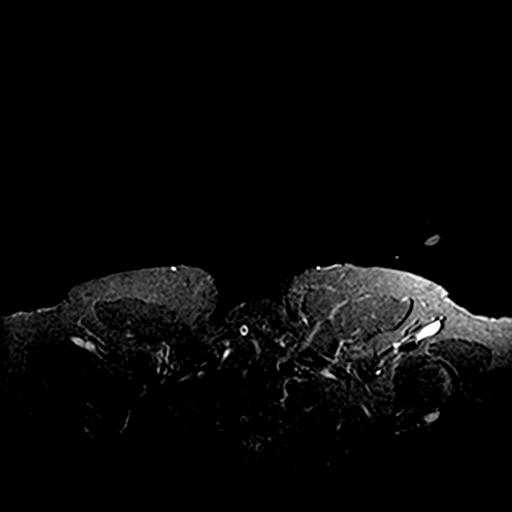

[Series 5: fl3d pre-cm · axial · non-contrast · 1.2mm · 0.99mm/px · z∈[-78,+94]mm · 5 of 144 slices shown]
[im 1/144]
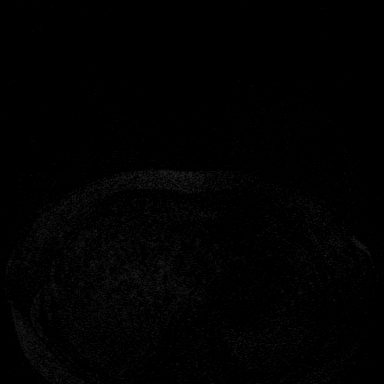
[im 36/144]
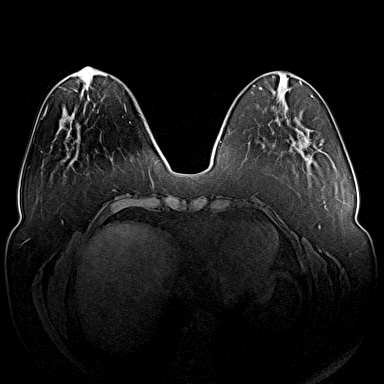
[im 72/144]
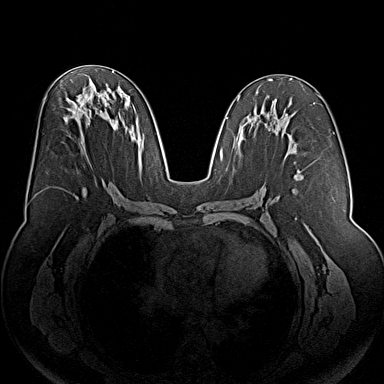
[im 108/144]
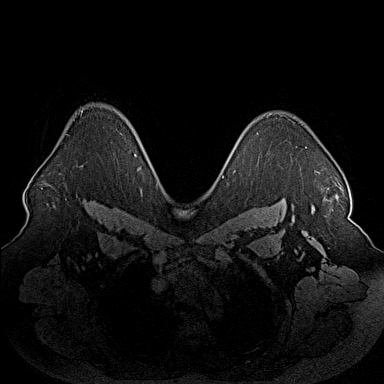
[im 144/144]
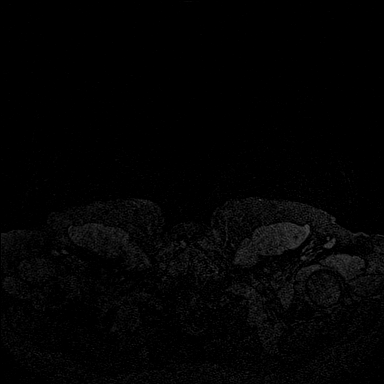

[Series 6: fl3d post immediate · axial · 1.2mm · 0.99mm/px · z∈[-78,+94]mm · 5 of 144 slices shown (1 of 2)]
[im 1/144]
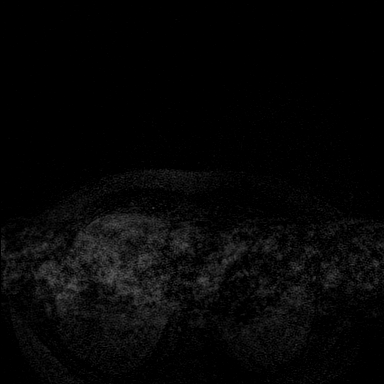
[im 36/144]
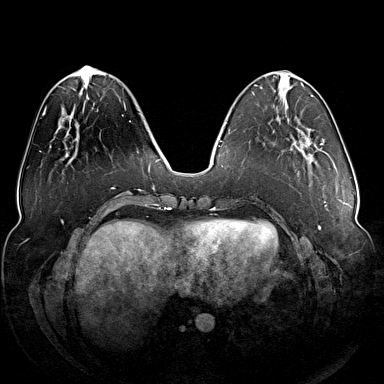
[im 72/144]
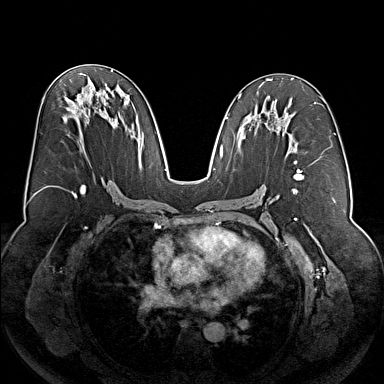
[im 108/144]
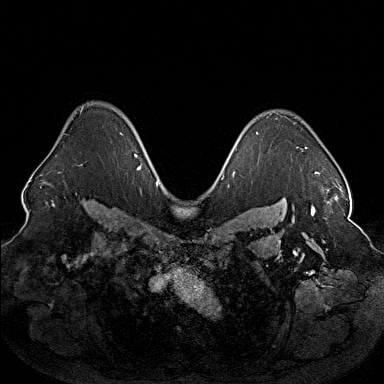
[im 144/144]
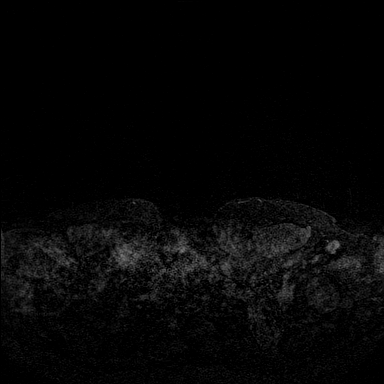

[Series 7: fl3d post immediate · axial · 1.2mm · 0.99mm/px · z∈[-78,+50]mm · 4 of 144 slices shown (2 of 2)]
[im 1/144]
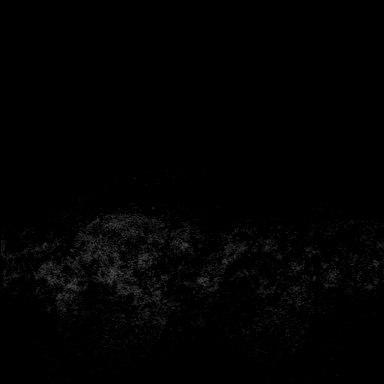
[im 36/144]
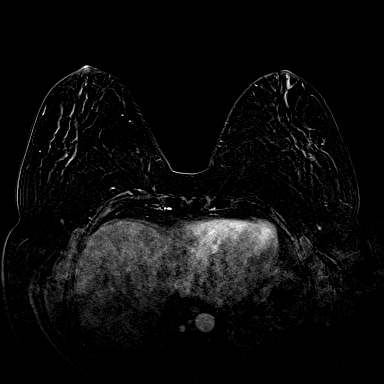
[im 72/144]
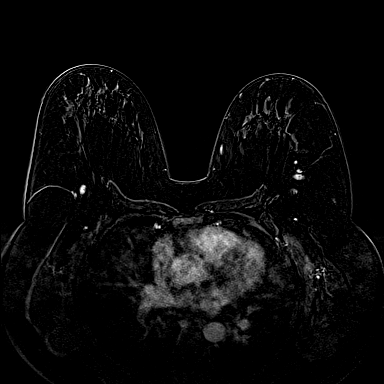
[im 108/144]
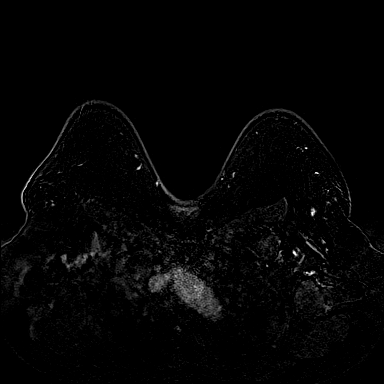

[24 of 48 positions shown; findings below may reference images not displayed]

THREE-DIMENSIONAL MR IMAGE RENDERING ON INDEPENDENT WORKSTATION:

Three-dimensional MR images were rendered by post-processing of the
original MR data on an independent workstation. The
three-dimensional MR images were interpreted, and findings are
reported in the following complete MRI report for this study. Three
dimensional images were evaluated at the independent [employer]
workstation
FINDINGS: Breast composition: b.  Scattered fibroglandular tissue.

Background parenchymal enhancement: Moderate

Right breast: No mass or abnormal enhancement.

Left breast: No mass or abnormal enhancement. There are stable
intramammary lymph nodes noted laterally within the left breast and
within the upper-outer quadrant of the left breast. Also, there is a
small simple cyst located within the upper inner quadrant of the
left breast which corresponds to the small, circumscribed nodule
seen in this region on the recent mammogram and ultrasound.

Lymph nodes: No abnormal appearing lymph nodes.

Ancillary findings:  None.
IMPRESSION: No findings worrisome for malignancy. Specifically, no evidence for
a subareolar intraductal mass.

RECOMMENDATION:
Bilateral screening mammography in 1 year.

BI-RADS CATEGORY  2: Benign.

## 2014-08-12 ENCOUNTER — Emergency Department
Admit: 2014-08-12 | Disposition: A | Discharge status: Home / Self Care | Payer: Self-pay | Admitting: Emergency Medicine

## 2014-09-05 NOTE — Consult Note (Signed)
Chief Complaint:   Subjective/Chief Complaint no nausea or vomiting, less abdominal pain.  S/p CT and enemas for flex sig   VITAL SIGNS/ANCILLARY NOTES: **Vital Signs.:   07-Mar-13 05:18   Vital Signs Type Routine   Temperature Temperature (F) 97.2   Celsius 36.2   Temperature Source oral   Pulse Pulse 67   Pulse source per Dinamap   Respirations Respirations 20   Systolic BP Systolic BP 709   Diastolic BP (mmHg) Diastolic BP (mmHg) 75   Mean BP 87   BP Source Dinamap   Pulse Ox % Pulse Ox % 94   Pulse Ox Activity Level  At rest   Oxygen Delivery Room Air/ 21 %   Brief Assessment:   Cardiac Regular    Respiratory clear BS    Gastrointestinal details normal Soft  Nondistended  No masses palpable  Bowel sounds normal  No rebound tenderness  mild tender to palpation in the rlq.   Routine Hem:  05-Mar-13 18:29    Hemoglobin (CBC) 13.1  06-Mar-13 00:14    Hemoglobin (CBC) 12.7    05:55    WBC (CBC) 4.6   RBC (CBC) 4.17   Hemoglobin (CBC) 12.2   Hematocrit (CBC) 36.5   Platelet Count (CBC) 174   MCV 88   MCH 29.3   MCHC 33.4   RDW 14.1  Routine Chem:  06-Mar-13 05:55    Glucose, Serum 105   BUN 10   Creatinine (comp) 0.88   Sodium, Serum 142   Potassium, Serum 3.7   Chloride, Serum 109   CO2, Serum 25   Calcium (Total), Serum 8.3   Osmolality (calc) 283   eGFR (African American) >60   eGFR (Non-African American) >60   Anion Gap 8  Routine Hem:  06-Mar-13 05:55    Neutrophil % 44.0   Lymphocyte % 46.8   Monocyte % 6.9   Eosinophil % 1.8   Basophil % 0.5   Neutrophil # 2.0   Lymphocyte # 2.1   Monocyte # 0.3   Eosinophil # 0.1   Basophil # 0.0  07-Mar-13 05:28    Hemoglobin (CBC) 12.5   Radiology Results: CT:    07-Mar-13 08:07, CT Abdomen and Pelvis Without Contrast   CT Abdomen and Pelvis Without Contrast    PRELIMINARY REPORT    The following is a PRELIMINARY Radiology report.  A final report will follow pending radiologist  verification.      REASON FOR EXAM:    (1) rectal bleed; (2) rectal bleed;    NOTE: Nursing   to Give Oral CT Contrast  COMMENTS:       PROCEDURE: CT  - CT ABDOMEN AND PELVIS W0  - Jul 19 2011  8:07AM     RESULT: CT of the abdomen and pelvis is performed with oral contrast only   and reconstructed at 3.0 mm slice thickness in the axial plane.   Comparison is made to the previous exam dated July 29, 2008 which was a   noncontrast exam.    The lung bases appear clear except for dependent atelectasis. Noncontrast   images of the kidneys, liver, spleen, gallbladder, pancreas, aorta and   adrenal glands appear unremarkable. No abnormal gastric, small bowel or   colonic distention is seen. No definite wall thickening or inflammatory     stranding is evident. There is no ascites or abnormal fluid collection.   No free air is seen. The uterus andurinary bladder appear unremarkable.  No adnexal mass is appreciated. The appendix appears normal. No   adenopathy is evident. Bony structures appear intact.    IMPRESSION:     Unremarkable CT of the abdomen and pelvis. Evaluation of the solid   viscera is decreased given the lack of IV contrast.    Thank you for the opportunity to contribute to the care of your patient.           Dictated By: Sundra Aland, M.D., MD   Assessment/Plan:  Assessment/Plan:   Assessment 1) rectal bleeding, ? history of crohns disease-no recurrent bleeding since admission.  abdominal ct is unremarkable.    Plan 1) to have flex sig this pm.  I have  discussed the risks benefits and complications of flex sig to include not limited to bleeding infection perofration and sedation and she wishes to proceed. further recs to follow.   Electronic Signatures: Loistine Simas (MD)  (Signed 07-Mar-13 13:25)  Authored: Chief Complaint, VITAL SIGNS/ANCILLARY NOTES, Brief Assessment, Lab Results, Radiology Results, Assessment/Plan   Last Updated: 07-Mar-13 13:25  by Loistine Simas (MD)

## 2014-09-05 NOTE — H&P (Signed)
PATIENT NAME:  Terri Woods, Terri Woods MR#:  [id number] DATE OF BIRTH:  11/03/1970  DATE OF ADMISSION:  11/05/2011  PRIMARY CARE PHYSICIAN: Hortencia Pilar, MD  GASTROENTEROLOGIST: Lollie Sails, MD    CHIEF COMPLAINT: Rectal bleeding.   HISTORY OF PRESENT ILLNESS: The patient is a 44 year old female with history of possible Crohn's disease, comes in with rectal bleeding since Thursday, has had 5 to 7 episodes per day every time she goes to the bathroom. She says it was more burgundy previously with clots, and it comes out like water. In the ER, she does have a bright red blood per rectum in the commode. She has excruciating pain in the upper abdomen and in the left lower abdomen graded at a scale of 8 out of 10 in intensity. The Percocet that she takes on a regular basis does help. She has been taking Aleve twice a day for a few days but stopped. She came to the ER on Friday and left from the ER because the wait was quite long. Her hemoglobin on the 22nd was 13.1, and here in the Emergency Room today her hemoglobin is 13.2. She also has nausea, dizziness, shortness of breath, chest pain. She remembers choking on popcorn the other day and the bleeding happened afterwards. She also states that she got bitten by a tick and now has this rash on the side of her face and legs. Hospitalist Services were contacted for evaluation.   PAST MEDICAL HISTORY:  1. Chronic pain in the back.  2. Degenerative disk disease. 3. Scoliosis. 4. Anxiety. 5. Diverticulitis. 6. Questionable Crohn's disease. 7. Breast cancer treated with surgery. 8. Left ankle pain.  9. Hyperlipidemia.  10. Raynaud's phenomenon.   PAST SURGICAL HISTORY:  1. Right breast surgery. 2. A polyp that was precancerous was removed from the cervix secondary to an abnormal lesion.  3. A prolapsed rectum surgery.   ALLERGIES: Erythromycin, doxycycline, seafood and morphine.   MEDICATIONS:  1. Percocet 5/325, 1 tablet t.i.d. 2. Lipitor 20 mg at  bedtime.  3. Clonazepam 1 tablet t.i.d.  4. Cymbalta 60 mg, 1 tablet daily.  5. Benadryl 25 mg, 2 tablets daily.  6. Omeprazole 20 mg daily.  7. Seroquel 100 mg at bedtime.  8. Topamax 100 mg b.i.d.    SOCIAL HISTORY: No smoking. No alcohol. No drug use. She is on Disability.   FAMILY HISTORY: Father unknown. Mother died in a car accident. Grandmother with leukemia.    REVIEW OF SYSTEMS: CONSTITUTIONAL: Positive for fever. Positive for chills. Positive for sweats. Positive for weight gain. Positive for fatigue. EYES: She does need glasses. EARS, NOSE, MOUTH, AND THROAT: No hearing loss. No sore throat. No difficulty swallowing. CARDIOVASCULAR: Positive for chest pain like somebody standing on her chest. RESPIRATORY: Positive shortness of breath with exertion. No coughing. No sputum. No hemoptysis. GASTROINTESTINAL: Positive for nausea. No vomiting. Positive for abdominal pain in the epigastric and left lower quadrant. Positive for bright red blood per rectum today, burgundy previously. GENITOURINARY: No burning on urination. Positive for vaginal irritation but no discharge. No hematuria.  MUSCULOSKELETAL: Positive for joint pain and muscle pain. INTEGUMENTARY: Positive for rash on the left face and legs. ENDOCRINE: No thyroid problems. HEMATOLOGIC/LYMPHATIC: No anemia. NEUROLOGICAL: No fainting or blackouts. PSYCHIATRIC: On medication for anxiety.   PHYSICAL EXAMINATION:  VITAL SIGNS: Temperature 98.2, pulse 81, respirations 22, blood pressure 148/85, pulse oximetry 96%.   GENERAL: No respiratory distress.  HEENT: Eyes: Conjunctivae and lids normal. Pupils are equal, round,  and reactive to light. Extraocular muscles are intact. No nystagmus. Ears, nose, mouth, and throat: Tympanic membranes no erythema. Nasal mucosa no erythema. Throat no erythema. No exudate seen. Lips and gums no lesions.   NECK: No JVD. No bruits. No lymphadenopathy. No thyromegaly. Thyroid nodules palpated.   LUNGS: Lungs  are clear to auscultation. No use of accessory muscles to breathe. No rhonchi, rales, or wheeze heard.   HEART: S1, S2 normal. No gallops, rubs, or murmurs heard. Carotid upstroke 2+ bilaterally. No bruits.   EXTREMITIES: Dorsalis pedis pulses 2+ bilaterally.   ABDOMEN: Soft. Positive tenderness in the epigastric area and left lower quadrant. No organomegaly/splenomegaly.   GENITOURINARY: Gross blood in the commode with some clots.   LYMPHATIC: No lymph nodes in the neck.   MUSCULOSKELETAL: No clubbing, edema, or cyanosis.   SKIN: On the left face there is maculopapular-type rash, no blanching. At the site of the tick bite on the back of the head, there is no surrounding rash. On the legs there is a small macular rash with scabbing. No signs of infection.   NEUROLOGICAL: Cranial nerves II through XII are grossly intact. Deep tendon reflexes 2+ bilateral lower extremities.   PSYCHIATRIC: The patient is oriented to person, place, and time.   LABORATORY, DIAGNOSTIC AND RADIOLOGICAL DATA:  CT scan of the abdomen and pelvis without contrast showed fatty infiltration of the liver. No evidence of bowel obstruction or perforation. No appendicitis. No acute inflammatory process appreciated.  White blood cell count 4.6, hemoglobin and hematocrit 13.2 and 38.8, platelet count of 160.  Glucose 158, BUN 11, creatinine 0.74, sodium 141, potassium 3.7, chloride 109, CO2 23.  Liver function tests: AST slightly elevated at 69. Other liver function tests within the normal range.  Pregnancy test negative.  Urinalysis 1+ leukocyte esterase.  Lipase 156.  EKG showed a normal sinus rhythm, no acute ST-T wave changes.   ASSESSMENT AND PLAN:  1. Abdominal pain in the epigastric and left lower quadrant with rectal bleeding: With his history of Crohn's disease, the case was discussed with Dr. Candace Cruise, of Gastroenterology.  He is to see the patient in the a.m. He recommended empiric Cipro and Flagyl for right now. I  will send off stool studies if any more bowel movements. I will give pain control with IV Dilaudid and p.o. Percocet. I will discontinue Aleve. I will switch omeprazole to IV Protonix. I will get serial hemoglobins. Since the hemoglobin has been stable over the past five days, it is unclear how much she is bleeding. I will give IV fluid hydration and continue to monitor closely. I will admit as an observation.  2. Chronic pain: We will continue the patient's Percocet.  3. Anxiety and insomnia: Continue the patient's psychiatric medication.  4. Tick bite: She does not have classic Lyme rash. We will send off a Lyme titer. We will also send off a [facility] Spotted Fever titer. The patient is allergic to doxycycline.  TIME SPENT ON ADMISSION: 55 minutes.   ____________________________ Tana Conch. Leslye Peer, MD rjw:cbb D: 11/05/2011 17:59:20 ET T: 11/05/2011 18:57:13 ET JOB#: [id number]  cc: Tana Conch. Leslye Peer, MD, <Dictator> Kerin Perna, MD Lollie Sails, MD Marisue Brooklyn MD ELECTRONICALLY SIGNED 11/06/2011 16:31

## 2014-09-05 NOTE — Consult Note (Signed)
Full consult to follow. Remote hx of Crohn's disease. Colon done in 3/12 showed mild proctitis. Rest of colon and terminal ileum were normal. Pt did not f/u in GI afterwards. No meds prescribed. Seen in 5/13 here for abd pain and rectal bleeding. Flex sig showed diverticulosis and nonbleeding internal hemorrhoids. No proctitis seen.  No f/u either afterwards..Now with gross rectal bleeding and abd pain with nausea. Pain more in epigastrum than low abd. No prior hx of PUD. Bleeding likely from diverticulosis. Bleeding has stopped today. Recommend advancing diet. Daily PPI. Agree with Abx for now. If pain persists, then consider EGD soon. Will follow. Thanks.  Electronic Signatures: Verdie Shire (MD)  (Signed on 25-Jun-13 13:25)  Authored  Last Updated: 25-Jun-13 13:25 by Verdie Shire (MD)

## 2014-09-05 NOTE — Consult Note (Signed)
PATIENT NAME:  Terri Woods, Terri Woods MR#:  [id number] DATE OF BIRTH:  1971/01/12  DATE OF CONSULTATION:  11/06/2011  REFERRING PHYSICIAN:   CONSULTING PHYSICIAN:  Lupita Dawn. Jaggar Benko, MD  REASON FOR REFERRAL: Abdominal pain, rectal bleeding.   HISTORY OF PRESENT ILLNESS: The patient is a 44 year old white female with remote history of possible Crohn's disease who presented to the Emergency Room with rectal bleeding since Thursday associated with significant abdominal pain. She has been experiencing at least 5 to 7 episodes of gross hematochezia that is burgundy in color. She also has been experiencing significant abdominal pain. The pain is more localized to the upper abdomen rather than the left lower quadrant area. She describes the pain as 8 out of 10 in intensity. She does take some Percocet which does help. She tells me this pain is recent since Thursday as well. She has been taking some Aleve twice a day for a few days but then stopped.   She was here on Friday but left because the wait was too long. Hemoglobin was 13.1 on 11/03/2011. Here in the Emergency Room, hemoglobin is fairly stable again at 13.2. The patient also complains of some nausea, dizziness, shortness of breath, and some chest pain.   She was recently treated for a urinary tract infection with antibiotics. She apparently now has a yeast infection as well. She also states that she was bitten by a tick and has developed some rash.  The patient had a colonoscopy by me in March 2012 where she was found to have mild proctitis on biopsy. The rest of the colon was normal. The terminal ileum was normal. The patient was admitted in May of 2013 for some rectal bleeding. Dr. Gustavo Lah did a flexible sigmoidoscopy where diverticular disease was noted. There were some internal hemorrhoids that were not bleeding. There was no evidence of proctitis this time.  PAST MEDICAL HISTORY:  1. Scoliosis.  2. Questionable Crohn's disease.  3. History of anxiety.   4. History of breast cancer treated with surgery.  5. Hyperlipidemia.  6. Raynaud's phenomena.   PAST SURGICAL HISTORY:  1. Right breast surgery. 2. History of prolapsed rectum which was not seen at the time of colonoscopy in 2012.   ALLERGIES: Erythromycin, morphine, doxycycline.  HOME MEDICATIONS: Percocet, Lipitor, clonazepam, Cymbalta, Benadryl, Prilosec daily, Seroquel and Topamax.   SOCIAL HISTORY: She is does not smoke or drink.   FAMILY HISTORY: History is unknown, but mother died in a car accident.   REVIEW OF SYSTEMS: Some fevers and chills and sweats. She also complains of fatigue and weight gain. There are no visual or hearing changes. There is some shortness of breath but no cough. She also complains of mild chest pain. She also complains of epigastric and left lower quadrant pain as well. The rest of the review of systems is negative.   PHYSICAL EXAMINATION:   GENERAL: The patient is in mild distress because of the pain. She tells me the bleeding has stopped.   VITALS: Her vital signs are stable. She is afebrile.   HEENT: Normocephalic, atraumatic head. Pupils are equally reactive. Throat was clear.   NECK: Supple.   CARDIAC: Regular rhythm and rate without murmurs.   LUNGS: Clear bilaterally.   ABDOMEN: Normoactive bowel sounds, soft. There is some tenderness mostly in the epigastric area. There is some mild tenderness in the left lower quadrant area.   EXTREMITIES: No clubbing, cyanosis, or edema. There is no hepatomegaly.   SKIN: There is some slight  rash on the left side of the face.   NEUROLOGICAL: No gross focal deficits.  LABORATORY, DIAGNOSTIC AND RADIOLOGIC DATA: CT scan without contrast was essentially negative.   White count on admission was 4.6 and hemoglobin 13.2. Sodium 141, potassium 3.7, chloride 109, CO2 23, BUN 11, and creatinine 0.74. Liver enzymes showed an AST of 69.   Urinalysis showed 1+ leukocytes.   Lipase 156. Pregnancy test was  negative.   EKG was normal.   ASSESSMENT AND PLAN: This is a patient with abdominal pain and rectal bleeding. The bleeding has essentially stopped. I am more concerned about the abdominal pain that she still experiences. This pain could be functional because of history of anxiety. However, the pain could also be related to ulcer disease, although there is no history of it in the past.   For now I would agree with antibiotics that were started just in case she has diverticulitis. I suspect the bleeding came from diverticulosis and not from anything else, although she could have bled from hemorrhoids as well. I recommend that we have her on daily proton pump inhibitor. If the epigastric pain persists, then we may consider doing an upper endoscopy at a later point. Thank you for the referral. ____________________________ Lupita Dawn. Candace Cruise, MD pyo:slb D: 11/07/2011 09:22:00 ET T: 11/07/2011 11:07:38 ET JOB#: [id number]  cc: Lupita Dawn. Candace Cruise, MD, <Dictator> Lupita Dawn Corsica Franson MD ELECTRONICALLY SIGNED 11/19/2011 10:40

## 2014-09-05 NOTE — Consult Note (Signed)
Still with nausea. No more hematemesis. EGD showed mild gastritis. Not sure if this accounts for all her UGI sxs. Continue PPI. Await bx. Try low residue diet. CT done without contrast earlier. However, patient has contrast allergy. If sxs persist, t/c MRI of abdomen instead. Will follow. Thanks.  Electronic Signatures: Verdie Shire (MD)  (Signed on 27-Jun-13 08:58)  Authored  Last Updated: 27-Jun-13 08:58 by Verdie Shire (MD)

## 2014-09-05 NOTE — Discharge Summary (Signed)
PATIENT NAME:  Terri Woods, Terri Woods MR#:  [id number] DATE OF BIRTH:  [birth year]  DATE OF ADMISSION:  11/05/2011 DATE OF DISCHARGE:  11/09/2011  DISCHARGE DIAGNOSES:  1. Abdominal pain with nausea, vomiting, and hematochezia secondary to diverticular bleed.  2. Mild gastritis.  3. Lyme disease. 4. Chronic pain.  5. Anxiety and depression.  6. Urinary tract infection.   DISCHARGE MEDICATIONS:  1. Omeprazole 20 mg p.o. daily.  2. Seroquel 100 mg p.o. daily. 3. Percocet 1 tablet p.o. three times daily. 4. Cymbalta 60 mg p.o. daily.  5. Klonopin 1 mg p.o. three times daily. 6. Topiramate 200 mg p.o. twice a day. 7. Atorvastatin 20 mg p.o. daily.  8. Diphenhydramine as needed for itching 25 mg, 2 tablets.  9. Cipro 500 mg p.o. twice a day for three days. 10. Flagyl 500 mg p.o. twice a day for three days. 11. Amoxicillin 500 mg p.o. three times daily for two weeks.  12. Metoprolol 25 mg p.o. twice a day, for one month supply. She needs to go to followup with  primary doctor for refills for that.  13. Dilaudid 2 mg tablet p.o. twice a day, 20 tablets are given, no refills. 14. Zofran 4 mg p.o. three times daily.   CONSULTANTS: Verdie Shire, MD - Gastroenterology.  PROCEDURES: Upper GI endoscopy:  EGD showed mild acute gastritis. Normal esophagus. Gastric mucosa has erythema. Otherwise normal.     HOSPITAL COURSE:  1. This is a 44 year old female admitted for abdominal pain, nausea, vomiting, and hematochezia. The patient was admitted to the hospitalist service for lower gastrointestinal bleed and she had a CT of the abdomen on 11/05/2011, on admission, which showed fatty liver and no hydronephrosis, no evidence of bowel obstruction or perforation, no acute inflammatory process, and no findings of acute appendicitis. She was seen by the gastroenterologist. The patient was started on IV antibiotics with Cipro and Flagyl and started on fluids and pain medications. Dr. Candace Cruise saw the patient and felt that it  is not Crohn's and it is likely from her diverticular bleed and also hemorrhoids. The patient did have work-up for her GI issues with Dr. Gustavo Lah. Colonoscopy was done in March 2012 for mild proctitis. She had repeat flexible sigmoidoscopy on 10/01/2011 which showed diverticular disease but no proctitis. She also had internal hemorrhoids, but Dr. Candace Cruise thought it is probably diverticular bleed and also internal hemorrhoids that caused blood in the rectum. Anyway, the patient did not have any further episodes of rectal bleeding.  Hemoglobin also stayed stable. The patient's abdominal pain persisted with nausea and so she was taken for EGD which showed mild acute gastritis. The patient did not have any episodes of nausea, vomiting, or diarrhea on 11/09/2011 and wanted to go home. She was discharged with antibiotics and pain medications. The patient did tolerate the diet well.  2. Lyme disease. The patient's Lyme titers were negative. She has history of tick bite. She has horses at home and her Lyme IgG, IgM were positive. The patient is allergic to doxycycline so amoxicillin is given for two weeks. Her Lyme disease antibodies IgM and IgG was 1.11. indicating infection. [place] Spotted Fever antibodies are negative. Troponins have been negative. Rickettsial fever antibodies are negative. WBC on admission was normal, hemoglobin 13.2. Electrolytes were stable. Kidney function 0.74 and BUN 11 on admission. The patient did not have any further drop of hemoglobin and hemoglobin stayed stable around 12.6. Kidney function stayed stable. The patient did tolerate the diet and was  discharged home in stable condition. She was started on amoxicillin for Lyme disease. The patient's biopsy from EGD showed minimal chronic nonspecific gastritis, no H. pylori is identified. The patient did come to the nurse's station today, 11/10/2011, asking for Dilaudid prescription. All her medications are prescribed, but Dilaudid did not go  through so I gave          her a prescription, hardcopy, for 20 tablets. We will tell the pharmacy to fill her prescription of Dilaudid. The patient needs to follow up with her primary doctor, Dr. Hoy Morn, in 1 to 2 weeks.   TIME SPENT ON DISCHARGE PREPARATION: More than 30 minutes.  ____________________________ Epifanio Lesches, MD sk:slb D: 11/10/2011 36:46:80 ET T: 11/11/2011 15:31:44 ET JOB#: [id number]  cc: Epifanio Lesches, MD, <Dictator> Kerin Perna, MD Epifanio Lesches MD ELECTRONICALLY SIGNED 11/20/2011 9:35

## 2014-09-05 NOTE — Discharge Summary (Signed)
PATIENT NAME:  Terri Woods, SCHLICHTING MR#:  [id number] DATE OF BIRTH:  1970-09-02  DATE OF ADMISSION:  07/17/2011 DATE OF DISCHARGE:  07/19/2011  DISCHARGE DIAGNOSES:  1. Rectal bleed, most likely hemorrhoidal, status post flexible sigmoidoscopy.  2. Chronic pain. 3. Anxiety. 4. Depression.  5. Gastroesophageal reflux disease.   6. History of migraines   CONSULT: GI, Dr. Gustavo Lah   PROCEDURE DONE: Flexible sigmoidoscopy.   HOSPITAL COURSE: This is a 44 year old female who has history of chronic pain, anxiety, depression, history of migraines, and gastroesophageal reflux disease. She presented with bright red bleeding per rectum. She had about 4 to 5 episodes before she came to the Emergency Room. She had a similar episode in March of 2012. She was admitted with lower GI bleed. She was given some IV hydration. When she came in, her hemoglobin was stable at 13.1. Her platelet count was normal at 193,000. Her sed rate was normal at 12. Her INR was normal at 0.9. Her hemoglobin remained stable in the range of 12.5 at the time of discharge. She had no further rectal bleed. She said she had a dark bowel movement  last night. Her creatinine was stable at 0.88. She had C-reactive protein done that was normal. Dr. Gustavo Lah, saw her as a GI consultant. She had a CT of the abdomen and pelvis done without contrast which showed unremarkable CT of the abdomen and pelvis. This was a noncontrast study because the patient is allergic to iodinated contrast. The patient had a flexible sigmoidoscopy today and it was suggestive of diverticulosis in the mid sigmoid colon and the distal sigmoid colon, nonbleeding internal hemorrhoids. He suggested to discharge the patient on Analpram hydrocortisone cream for hemorrhoids. Most likely the rectal bleed was hemorrhoidal with hemoglobin remaining stable. He suggested that the patient should follow-up with GI in three weeks. There was no evidence of any colitis on the flexible  sigmoidoscopy.  MEDICATIONS AT DISCHARGE/HOME MEDICATIONS:  1. Omeprazole 20 mg daily.  2. Clonazepam 1 mg 3 times a day. 3. Topamax 20 mg daily. 4. Cymbalta 60 mg daily. 5. Stool softener daily.   6. Albuterol. 7. Imitrex. 8. Seroquel 50 mg daily. 9. Percocet 5/325 as needed for pain.  NEW MEDICATION: Analpram HC Cream 2.5% apply externally to anal area three times a day for 10 days.   DIET: Regular diet. Eat light for the first meal.   CONDITION AT DISCHARGE: She is comfortable. T-max 97.2, heart rate 67, blood pressure 113/75, saturating 94% on room air. CHEST is clear. ABDOMEN soft, nontender.   FOLLOW-UP:  1. The patient should follow-up with Dr. Hoy Morn in 1 to 2 weeks. Follow-up hemoglobin at Dr. Hoy Morn' office.  2. Follow-up with Dr. Gustavo Lah at [place] in three weeks.   TIME SPENT WITH THE DISCHARGE: 40 minutes.  ____________________________ Mena Pauls, MD ag:drc D: 07/19/2011 15:54:32 ET T: 07/20/2011 09:18:17 ET JOB#: [id number]  cc: Mena Pauls, MD, <Dictator> Kerin Perna, MD Lollie Sails, MD Mena Pauls MD ELECTRONICALLY SIGNED 07/23/2011 11:37

## 2014-09-05 NOTE — Consult Note (Signed)
Chief Complaint:   Subjective/Chief Complaint No further rectal bleeding. However, epig pain persists with bout of nausea/vomiting/hematemesis this AM.   VITAL SIGNS/ANCILLARY NOTES: **Vital Signs.:   26-Jun-13 09:29   Vital Signs Type Q 4hr   Temperature Temperature (F) 98.7   Celsius 37   Temperature Source Oral   Pulse Pulse 76   Pulse source per vital sign device   Respirations Respirations 19   Systolic BP Systolic BP 716   Diastolic BP (mmHg) Diastolic BP (mmHg) 83   Mean BP 98   BP Source vital sign device   Pulse Ox % Pulse Ox % 92   Pulse Ox Activity Level  At rest   Oxygen Delivery Room Air/ 21 %   Brief Assessment:   Cardiac Regular    Respiratory clear BS    Gastrointestinal Epig tenderness   Lab Results: Routine Chem:  25-Jun-13 05:17    Glucose, Serum  106   BUN 15   Creatinine (comp) 1.08   Sodium, Serum 141   Potassium, Serum 3.5   Chloride, Serum 107   CO2, Serum 26   Calcium (Total), Serum  8.3   Anion Gap 8   Osmolality (calc) 283   eGFR (African American) >60   eGFR (Non-African American) >60 (eGFR values <59m/min/1.73 m2 may be an indication of chronic kidney disease (CKD). Calculated eGFR is useful in patients with stable renal function. The eGFR calculation will not be reliable in acutely ill patients when serum creatinine is changing rapidly. It is not useful in  patients on dialysis. The eGFR calculation may not be applicable to patients at the low and high extremes of body sizes, pregnant women, and vegetarians.)  Cardiac:  25-Jun-13 05:17    Troponin I < 0.02 (0.00-0.05 0.05 ng/mL or less: NEGATIVE  Repeat testing in 3-6 hrs  if clinically indicated. >0.05 ng/mL: POTENTIAL  MYOCARDIAL INJURY. Repeat  testing in 3-6 hrs if  clinically indicated. NOTE: An increase or decrease  of 30% or more on serial  testing suggests a  clinically important change)  Routine Hem:  25-Jun-13 05:17    WBC (CBC) 4.6   RBC (CBC) 4.15    Hemoglobin (CBC) 12.3   Hematocrit (CBC) 36.1   Platelet Count (CBC) 159   MCV 87   MCH 29.5   MCHC 34.0   RDW 14.4   Neutrophil % 51.9   Lymphocyte % 37.8   Monocyte % 7.6   Eosinophil % 1.9   Basophil % 0.8   Neutrophil # 2.4   Lymphocyte # 1.7   Monocyte # 0.4   Eosinophil # 0.1   Basophil # 0.0 (Result(s) reported on 06 Nov 2011 at 06:25AM.)   Assessment/Plan:  Assessment/Plan:   Assessment Epig pain with nausea/vomiting.    Plan NPO after MN for EGD in AM. THanks   Electronic Signatures: OVerdie Shire(MD)  (Signed 26-Jun-13 12:16)  Authored: Chief Complaint, VITAL SIGNS/ANCILLARY NOTES, Brief Assessment, Lab Results, Assessment/Plan   Last Updated: 26-Jun-13 12:16 by OVerdie Shire(MD)

## 2014-09-05 NOTE — Consult Note (Signed)
Brief Consult Note: Diagnosis: rectal bleeding, lower abdominal pain.   Patient was seen by consultant.   Consult note dictated.   Recommend further assessment or treatment.   Orders entered.   Comments: Patient admitted yesterday with lower abdominal pain and rectal bleeding.  No bleeding since about 3 am, bleeding scan negative.  Discussion with patient indicates history of crohns disease diagnosed in 2003 at [facility], treated for some time.  Patietn is on no meds for crohns disease.   Review of chart shows no recent abd ct although she has had multiple ct of other body areas for various reasons.  She states she lists a contrast allergy due to allergy to seafood, and that she has had ct contrast in the past, apparently without problems.  Recommend CT abd and pelvis, with at least oral contrast if possible.  She had a colonoscopy in 3/12 for similar presentation with rectal mucosal friability, bx c/w colitis, but TI normal.  Currently pain is in the lower abdomen, mostly in the rlq.  Recommend ct as above, and if uninformative, would do flex sig tomorrow.  Electronic Signatures for Addendum Section:  Loistine Simas (MD) (Signed Addendum 06-Mar-13 19:09)  please see full consult [id number]   Electronic Signatures: Loistine Simas (MD)  (Signed 06-Mar-13 16:34)  Authored: Brief Consult Note   Last Updated: 06-Mar-13 19:09 by Loistine Simas (MD)

## 2014-09-05 NOTE — H&P (Signed)
PATIENT NAME:  Terri Woods, Terri Woods MR#:  [id number] DATE OF BIRTH:  01/15/1971  DATE OF ADMISSION:  07/17/2011  PRIMARY CARE PHYSICIAN: Dr. Hortencia Pilar  CHIEF COMPLAINT: Bright red blood per rectum.   HISTORY OF PRESENT ILLNESS: This is a 44 year old female who comes in from home complaining of bright red blood per rectum ongoing since yesterday. Patient has had five episodes of bright red blood per rectum at home and she had one episode here in the Emergency Room. Patient says there is not much stool, but mostly blood. Patient had a similar presentation like this in March of last year and was admitted to the hospital, underwent a colonoscopy which showed just friable rectal mucosa but no other abnormalities. She was discharged on a high fiber diet, although her biopsies from her colonoscopy were consistent with some colitis. Patient does not confirm that she has a history of Crohn's disease. She also complains of some left lower quadrant abdominal pain which is crampy in nature, improves when she has a bowel movement. She denies any vomiting but does admit to nausea. She also admits some chills but no documented fever. She denies any chest pain, shortness of breath, dizziness or any other associated symptoms presently.   REVIEW OF SYSTEMS: CONSTITUTIONAL: No documented fever. No weight gain. No weight loss. HEENT: No blurry or double vision. ENT: No tinnitus. No postnasal drip. No redness of the oropharynx. RESPIRATORY: No cough, no wheeze, no hemoptysis. CARDIOVASCULAR: No chest pain, no orthopnea, no palpitations, no syncope. GASTROINTESTINAL: Positive nausea. No vomiting, no diarrhea. Positive left lower quadrant abdominal pain. Positive hematochezia. No melena. GENITOURINARY: No dysuria, no hematuria. ENDOCRINE: No polyuria, no nocturia. No heat or cold intolerance. HEME: No anemia, no bruising, no bleeding. INTEGUMENTARY: No rashes. No lesions. MUSCULOSKELETAL: No arthritis, no swelling, no gout.  NEUROLOGIC: No numbness, no tingling, no ataxia, no seizure-type activity. PSYCH: No anxiety, no insomnia, no ADD.   PAST MEDICAL HISTORY:  1. Chronic pain syndrome. 2. Anxiety/depression.  3. History of migraines.  4. Gastroesophageal reflux disease.   ALLERGIES: Doxycycline, erythromycin, morphine, shellfish and IV dye.   SOCIAL HISTORY: No smoking. No alcohol abuse. No illicit drug abuse. Lives at home with her boyfriend.   FAMILY HISTORY: Patient's mother died at an early age from malignancy. Her grandfather died from complications of colon cancer. Patient cannot recall what her father died from as she does not keep in touch with him.   CURRENT MEDICATIONS:  1. Albuterol inhaler as needed.  2. Benadryl 25 mg as needed. 3. Dulcolax suppository as needed.  4. Klonopin 1 mg t.i.d.  5. Cymbalta 60 mg at bedtime. 6. Imitrex as needed.  7. Percocet 5/325, 1 to 2 tabs b.i.d. as needed.  8. Prilosec 20 mg daily.  9. Senokot 1 tablet daily as needed.  10. Seroquel 50 mg daily.  11. Topamax 100 mg b.i.d.   PHYSICAL EXAMINATION ON ADMISSION:  VITAL SIGNS: Patient's vital signs are noted to be: Temperature 97.2, pulse 78, respirations 20, blood pressure 142/73, saturations 93% on room air.   GENERAL: She is a pleasant appearing female, no apparent distress.   HEENT: She is atraumatic, normocephalic. Her extraocular muscles are intact. Pupils equal, reactive to light. Sclerae anicteric. No conjunctival injection. No pharyngeal erythema.   NECK: Supple. There is no jugular venous distention, no bruits, no lymphadenopathy, no thyromegaly.   HEART: Regular rate, rhythm. No murmurs, no rubs, no clicks.   LUNGS: Clear to auscultation bilaterally. No rales, no rhonchi,  no wheezes.   ABDOMEN: Soft, flat. Tender in the left lower quadrant. No rebound. No rigidity. No hepatosplenomegaly appreciated.   EXTREMITIES: There is no evidence of any cyanosis, clubbing, or peripheral edema. Has +2  pedal and radial pulses bilaterally.   NEUROLOGICAL: Patient is alert, awake, oriented x3 with no focal motor or sensory deficits appreciated bilaterally.   SKIN: Moist, warm with no rashes appreciated.   LYMPHATIC: There is no cervical or axillary lymphadenopathy.   LABORATORY, DIAGNOSTIC, AND RADIOLOGICAL DATA: Serum glucose 134, BUN 11, creatinine 0.7, sodium 143, potassium 3.5, chloride 110, bicarbonate 24. LFTs are within normal limits. Troponin less than 0.02. White cell count 4.9, hemoglobin 13.1, hematocrit 38.9, platelet count 193. Prothrombin time 12.4, INR 0.9.   ASSESSMENT AND PLAN: This is a 44 year old female with a history of chronic pain syndrome, anxiety/depression, gastroesophageal reflux disease, history of migraines presents to the hospital with multiple episodes of bright red blood per rectum.  1. Lower GI bleed. The exact etiology currently unclear. Patient has had multiple episodes of hematochezia prior to coming to the hospital although hemoglobin is stable and her vitals are stable. She had a very similar presentation like this in March 2012 and had a colonoscopy which showed inflamed rectal mucosa but no rectal prolapse. She was discharged on a high fiber diet although her biopsies were consistent with some nonspecific colitis. There is some concern for possible underlying Crohn's. For now I will follow serial hemoglobins. Transfuse as needed. Place her on a clear liquid diet. I discussed the case with Dr. Gustavo Lah. He will see her tomorrow. There is a bleeding scan that has been ordered by the Emergency Room, I will follow that up. She is not on any anticoagulants and her INR is stable.  2. History of anxiety/depression. Continue Seroquel, Topamax, Klonopin and Cymbalta.  3. Gastroesophageal reflux disease. Will place on IV Protonix.  4. Chronic pain. Patient apparently is allergic to morphine which causes GI distress. She only wants Dilaudid. She does take some Percocet at  home. I will start her on some Dilaudid 1 mg q.6 hours. Also she likes Benadryl as Dilaudid causes her itching which I have also ordered. I have some concern that she may have some pain seeking behavior. This likely needs to be followed during her hospital course.  5. CODE STATUS: Patient is a FULL CODE.   TIME SPENT: 50 minutes.   ____________________________ Belia Heman. Verdell Carmine, MD vjs:cms D: 07/17/2011 22:32:39 ET T: 07/18/2011 06:18:19 ET JOB#: [id number]  cc: Belia Heman. Verdell Carmine, MD, <Dictator> Kerin Perna, MD Henreitta Leber MD ELECTRONICALLY SIGNED 07/19/2011 8:15

## 2014-09-05 NOTE — Consult Note (Signed)
PATIENT NAME:  Terri Woods, WOOLDRIDGE MR#:  [id number] DATE OF BIRTH:  1970-07-29  DATE OF CONSULTATION:  07/18/2011  REFERRING PHYSICIAN:  Abel Presto, MD  CONSULTING PHYSICIAN:  Lollie Sails, MD  REASON FOR CONSULTATION: Rectal bleeding.   HISTORY OF PRESENT ILLNESS: Ms. Terri Woods is a 44 year old Caucasian female who came to the Emergency Room last night with a complaint of rectal bleeding. This apparently began at about 11 a.m. yesterday morning. She states that the last time she saw any blood was at about 3 a.m. this morning. She describes it as being "dark red". She typically has a bowel movement daily that is formed. She has had episodes of rectal bleeding in the past. Indeed, she underwent a colonoscopy about a year ago for similar presentation. At that time the colonoscopy showed some evidence of colitis in the rectum but a normal terminal ileum and normal scope otherwise. She states she has a history of Crohn's disease diagnosed initially at [facility] in 2003. She states she has been followed at [facility] for this problem, the last time last year being seen there. However, when I asked her what medication she has been on she tells me omeprazole and other noninflammatory bowel disease related medications. She states she has been having some right lower quadrant abdominal pain as well as some generalized lower abdominal discomfort that started when she began to have the bleeding. She does relate some nausea but no vomiting. She has no heartburn or dysphagia.   GI FAMILY HISTORY: Pertinent for father with peptic ulcer disease. There is no family history of colon cancer. She states that she did have some antibiotics a couple of weeks ago. She has a remote history of C. difficile. She denies problems with diarrhea otherwise.   Grandfather with colon cancer, although she initially denied a family history of colon cancer.   PAST MEDICAL HISTORY:  1. Chronic pain syndrome.  2. Anxiety and depression.   3. History of migraines. 4. Gastroesophageal reflux.   SOCIAL HISTORY: She does not use alcohol. She is a nonsmoker.     OUTPATIENT MEDICATIONS:  1. Albuterol. 2. Clonazepam 1 mg 3 times a day.  3. Cymbalta 60 mg once a day. 4. Imitrex p.r.n.  5. Omeprazole 20 mg a day. 6. Percocet 5/325 one p.r.n. for pain.  7. Seroquel 50 mg once a day.  8. Stool softener. 9. Topamax 100 mg 2 tablets once a day.   PHYSICAL EXAMINATION:   VITAL SIGNS: Temperature 97.6, pulse 89, respirations 20, blood pressure 115/69, pulse oximetry between 95 and 92%.   GENERAL: She is a 44 year old Caucasian female in no acute distress.   HEENT: Normocephalic, atraumatic.   EYES: Anicteric.   NOSE: Septum midline. No lesions.   OROPHARYNX: No lesions.   NECK: Supple. No JVD.   HEART: Regular rate and rhythm.   LUNGS: Bilaterally clear.   ABDOMEN: Soft. There is a generalized discomfort to palpation more so in the lower abdomen and somewhat more so to the right. There are no masses, rebound, or organomegaly.   RECTAL: Anorectal exam is deferred.   EXTREMITIES: No clubbing, cyanosis, or edema.   NEUROLOGICAL: Cranial nerves II through XII grossly intact. Muscle strength bilaterally equal and symmetric 5/5. Deep tendon reflexes bilaterally equal and symmetric.   LABORATORY, DIAGNOSTIC, AND RADIOLOGICAL DATA: Laboratories from last night included a glucose of 134, BUN 11, creatinine 0.78, sodium 143, potassium 3.5, chloride 110, bicarb 24, calcium 8.6. Lipase 152. Hepatic profile was normal except  the AST slightly elevated at 42 on a scale of 37. Her troponin-I x1 was normal. Hemogram showed a white count of 4.9, hemoglobin/hematocrit 13.1/38.9, platelet count 193, hemoglobin 13.1. Sed rate was 12. She has subsequently had two hemoglobins, one of them 12.7, one of them 12.2. Her platelet count is 174. Again, she has had no recurrent rectal bleeding since 3 a.m. this morning. She had a GI bleeding scan  showing no evidence of active gastrointestinal bleeding.   ASSESSMENT: Hematochezia. This is in the setting of possible history of Crohn's disease. She did have a colonoscopy about a year ago for similar presentation showing some amount of proctitis/colitis consistent with possibly inflammatory bowel disease. She does not take any medications in regards to inflammatory bowel disease. Abdominal pain is in excess of what I would believe to be related to any anal outlet bleeding. She does, however, have a normal sed rate.   RECOMMENDATIONS:  1. Recommend CT scan of the abdomen and pelvis with contrast if possible. On her medical records, she has a contrast iodinated radiocontrast dye allergy; however, in discussion with patient she states that she has been told that because she was allergic to shellfish. She did tell me that she has had contrasted scans, the last at [facility] at least over a year ago, with IV contrast, this being not a problem for her. I asked her whether or not they had given her medications before the study in regards to steroids or other medications and she said they did not. There is still some question in my mind as to whether she has a true contrast allergy or not, however, would recommend pursuing the CT scan with non-iodinated contrast.  2. Would recommend a flexible sigmoidoscopy. Will arrange this for tomorrow afternoon.   ____________________________ Lollie Sails, MD mus:drc D: 07/18/2011 19:08:52 ET T: 07/19/2011 06:06:53 ET JOB#: [id number]  cc: Lollie Sails, MD, <Dictator> Lollie Sails MD ELECTRONICALLY SIGNED 08/07/2011 16:45

## 2014-09-20 ENCOUNTER — Encounter: Payer: Self-pay | Admitting: Emergency Medicine

## 2014-09-20 ENCOUNTER — Ambulatory Visit: Payer: Medicare Other

## 2014-09-20 ENCOUNTER — Ambulatory Visit
Admission: EM | Admit: 2014-09-20 | Discharge: 2014-09-20 | Disposition: A | Discharge status: Home / Self Care | Payer: Medicare Other | Attending: Family Medicine | Admitting: Family Medicine

## 2014-09-20 DIAGNOSIS — S9032XA Contusion of left foot, initial encounter: Secondary | ICD-10-CM

## 2014-09-20 DIAGNOSIS — M797 Fibromyalgia: Secondary | ICD-10-CM | POA: Insufficient documentation

## 2014-09-20 DIAGNOSIS — K5090 Crohn's disease, unspecified, without complications: Secondary | ICD-10-CM | POA: Insufficient documentation

## 2014-09-20 DIAGNOSIS — S9031XA Contusion of right foot, initial encounter: Secondary | ICD-10-CM | POA: Diagnosis not present

## 2014-09-20 DIAGNOSIS — S92504A Nondisplaced unspecified fracture of right lesser toe(s), initial encounter for closed fracture: Secondary | ICD-10-CM | POA: Insufficient documentation

## 2014-09-20 DIAGNOSIS — S62308A Unspecified fracture of other metacarpal bone, initial encounter for closed fracture: Secondary | ICD-10-CM

## 2014-09-20 DIAGNOSIS — M79671 Pain in right foot: Secondary | ICD-10-CM | POA: Diagnosis present

## 2014-09-20 DIAGNOSIS — X58XXXA Exposure to other specified factors, initial encounter: Secondary | ICD-10-CM | POA: Insufficient documentation

## 2014-09-20 HISTORY — DX: Dorsalgia, unspecified: M54.9

## 2014-09-20 HISTORY — DX: Fibromyalgia: M79.7

## 2014-09-20 HISTORY — DX: Crohn's disease, unspecified, without complications: K50.90

## 2014-09-20 MED ORDER — MELOXICAM 15 MG PO TABS: 15.0000 mg | ORAL_TABLET | Freq: Every day | ORAL | Status: DC

## 2014-09-20 NOTE — ED Provider Notes (Signed)
CSN: [id number]     Arrival date & time 09/20/14  1321 History   First MD Initiated Contact with Patient 09/20/14 1407     Chief Complaint  Patient presents with  . Foot Pain   (Consider location/radiation/quality/duration/timing/severity/associated sxs/prior Treatment) Patient is a 44 y.o. female presenting with lower extremity pain. The history is provided by the patient and the spouse. No language interpreter was used.  Foot Pain This is a new problem. The current episode started yesterday. The problem occurs constantly. The problem has been gradually worsening. Associated symptoms include headaches and shortness of breath. Pertinent negatives include no chest pain and no abdominal pain. The symptoms are aggravated by twisting and walking. Nothing relieves the symptoms. She has tried rest for the symptoms.    Past Medical History  Diagnosis Date  . Crohn's disease   . Fibromyalgia   . Back pain    Past Surgical History  Procedure Laterality Date  . Shoulder surgery    . Small intestine surgery    . Breast surgery     History reviewed. No pertinent family history. History  Substance Use Topics  . Smoking status: Never Smoker   . Smokeless tobacco: Not on file  . Alcohol Use: No   OB History    No data available     Review of Systems  Respiratory: Positive for shortness of breath.   Cardiovascular: Negative for chest pain.  Gastrointestinal: Negative for abdominal pain.  Musculoskeletal: Positive for myalgias and gait problem.  Skin: Negative for color change.  Neurological: Positive for headaches.   She has been sen hetre several times for fall. She has recently fallen and had a head injury w/intercranial bleed and was admitted at the [facility] for several days.  Allergies  Bee venom; Doxycycline; Erythromycin; and Shellfish allergy  Home Medications   Prior to Admission medications   Medication Sig Start Date End Date Taking? Authorizing Provider  clonazePAM  (KLONOPIN) 1 MG tablet Take 1 mg by mouth 2 (two) times daily.   Yes Historical Provider, MD  EPINEPHrine 0.3 mg/0.3 mL IJ SOAJ injection Inject 0.3 mg into the muscle once.   Yes Historical Provider, MD  OxyCODONE (OXYCONTIN) 10 mg T12A 12 hr tablet Take 10 mg by mouth every 12 (twelve) hours.   Yes Historical Provider, MD  oxyCODONE-acetaminophen (PERCOCET/ROXICET) 5-325 MG per tablet Take by mouth every 4 (four) hours as needed for severe pain.   Yes Historical Provider, MD  topiramate (TOPAMAX) 100 MG tablet Take 100 mg by mouth 2 (two) times daily.   Yes Historical Provider, MD  meloxicam (MOBIC) 15 MG tablet Take 1 tablet (15 mg total) by mouth daily. 09/20/14   Frederich Cha, MD   BP 142/90 mmHg  Pulse 88  Temp(Src) 97.4 F (36.3 C) (Tympanic)  Resp 16  Ht 5\' 1"  (1.549 m)  Wt 155 lb (70.308 kg)  BMI 29.30 kg/m2  SpO2 96%  LMP 09/19/2012 Physical Exam  Constitutional: She is oriented to person, place, and time. She appears well-nourished.  HENT:  Head: Normocephalic and atraumatic.  Musculoskeletal: She exhibits edema and tenderness.       Right ankle: She exhibits decreased range of motion and swelling.       Left ankle: She exhibits swelling. Tenderness.       Feet:  Neurological: She is oriented to person, place, and time.  Vitals reviewed.   ED Course  Procedures (including critical care time) Labs Review Labs Reviewed - No data to  display  Patient declined toradol injection.  Imaging Review Dg Foot Complete Right  09/20/2014   CLINICAL DATA:  Pain and bruising laterally after kicking water coolor 1 day prior  EXAM: RIGHT FOOT COMPLETE - 3+ VIEW  COMPARISON:  November 23, 2007  FINDINGS: Frontal, oblique, and lateral views were obtained. There is minimal avulsion along the medial proximal aspect of the fifth proximal phalanx. No other evidence of fracture. No dislocation. Joint spaces appear intact. No erosive change.  IMPRESSION: Tiny avulsion along the medial proximal aspect  of the fifth proximal phalanx. No other evidence of fracture. No dislocation. No appreciable arthropathy.   Electronically Signed   By: Lowella Grip III M.D.   On: 09/20/2014 15:23     MDM   1. Contusion, foot, right, initial encounter   2. Closed fracture of 5th metacarpal, initial encounter        Frederich Cha, MD 09/21/14 [phone]

## 2014-09-20 NOTE — ED Notes (Signed)
Pt was camping and hit R foot on cooler, about 2 days ago. Difficult to bear weight. Very painful to press down on foot. Pain mid foot.

## 2014-09-20 NOTE — ED Provider Notes (Signed)
CSN: [id number]     Arrival date & time 09/20/14  1321 History   First MD Initiated Contact with Patient 09/20/14 1407     Chief Complaint  Patient presents with  . Foot Pain   (Consider location/radiation/quality/duration/timing/severity/associated sxs/prior Treatment) HPI  Past Medical History  Diagnosis Date  . Crohn's disease   . Fibromyalgia   . Back pain    Past Surgical History  Procedure Laterality Date  . Shoulder surgery    . Small intestine surgery    . Breast surgery     History reviewed. No pertinent family history. History  Substance Use Topics  . Smoking status: Never Smoker   . Smokeless tobacco: Not on file  . Alcohol Use: No   OB History    No data available     Review of Systems  Allergies  Bee venom; Doxycycline; Erythromycin; and Shellfish allergy  Home Medications   Prior to Admission medications   Medication Sig Start Date End Date Taking? Authorizing Provider  clonazePAM (KLONOPIN) 1 MG tablet Take 1 mg by mouth 2 (two) times daily.   Yes Historical Provider, MD  EPINEPHrine 0.3 mg/0.3 mL IJ SOAJ injection Inject 0.3 mg into the muscle once.   Yes Historical Provider, MD  OxyCODONE (OXYCONTIN) 10 mg T12A 12 hr tablet Take 10 mg by mouth every 12 (twelve) hours.   Yes Historical Provider, MD  oxyCODONE-acetaminophen (PERCOCET/ROXICET) 5-325 MG per tablet Take by mouth every 4 (four) hours as needed for severe pain.   Yes Historical Provider, MD  topiramate (TOPAMAX) 100 MG tablet Take 100 mg by mouth 2 (two) times daily.   Yes Historical Provider, MD  meloxicam (MOBIC) 15 MG tablet Take 1 tablet (15 mg total) by mouth daily. 09/20/14   Frederich Cha, MD   BP 142/90 mmHg  Pulse 88  Temp(Src) 97.4 F (36.3 C) (Tympanic)  Resp 16  Ht 5\' 1"  (1.549 m)  Wt 155 lb (70.308 kg)  BMI 29.30 kg/m2  SpO2 96%  LMP 09/19/2012 Physical Exam  ED Course  Procedures (including critical care time) Labs Review Labs Reviewed - No data to display  Imaging  Review Dg Foot Complete Right  09/20/2014   CLINICAL DATA:  Pain and bruising laterally after kicking water coolor 1 day prior  EXAM: RIGHT FOOT COMPLETE - 3+ VIEW  COMPARISON:  November 23, 2007  FINDINGS: Frontal, oblique, and lateral views were obtained. There is minimal avulsion along the medial proximal aspect of the fifth proximal phalanx. No other evidence of fracture. No dislocation. Joint spaces appear intact. No erosive change.  IMPRESSION: Tiny avulsion along the medial proximal aspect of the fifth proximal phalanx. No other evidence of fracture. No dislocation. No appreciable arthropathy.   Electronically Signed   By: Lowella Grip III M.D.   On: 09/20/2014 15:23     MDM   1. Contusion, foot, right, initial encounter    Addendum: X-ray demonstrates a tiny avulsion at the medial aspect of the fifth proximal phalanx, not in the proximal or mid foot. Unclear to me whether this is acute, or if it is contributing to today's presentation. Have placed the patient in a postop shoe for comfort, with weightbearing as tolerated. Rx for Mobic sent to the pharmacy by Dr. Alveta Heimlich. The patient has additional pain medication at home.  Jodi Mourning MD    Sherlene Shams, MD 09/20/14 [phone]

## 2014-09-20 NOTE — Discharge Instructions (Signed)
Contusion A contusion is a deep bruise. Contusions happen when an injury causes bleeding under the skin. Signs of bruising include pain, puffiness (swelling), and discolored skin. The contusion may turn blue, purple, or yellow. HOME CARE   Put ice on the injured area.  Put ice in a plastic bag.  Place a towel between your skin and the bag.  Leave the ice on for 15-20 minutes, 03-04 times a day.  Only take medicine as told by your doctor.  Rest the injured area.  If possible, raise (elevate) the injured area to lessen puffiness. GET HELP RIGHT AWAY IF:   You have more bruising or puffiness.  You have pain that is getting worse.  Your puffiness or pain is not helped by medicine. MAKE SURE YOU:   Understand these instructions.  Will watch your condition.  Will get help right away if you are not doing well or get worse. Document Released: 10/17/2007 Document Revised: 07/23/2011 Document Reviewed: 03/05/2011 [facility] Patient Information 2015 [place], [place]. This information is not intended to replace advice given to you by your health care provider. Make sure you discuss any questions you have with your health care provider.  Blunt Trauma You have been evaluated for injuries. You have been examined and your caregiver has not found injuries serious enough to require hospitalization. It is common to have multiple bruises and sore muscles following an accident. These tend to feel worse for the first 24 hours. You will feel more stiffness and soreness over the next several hours and worse when you wake up the first morning after your accident. After this point, you should begin to improve with each passing day. The amount of improvement depends on the amount of damage done in the accident. Following your accident, if some part of your body does not work as it should, or if the pain in any area continues to increase, you should return to the Emergency Department for re-evaluation.  HOME  CARE INSTRUCTIONS  Routine care for sore areas should include:  Ice to sore areas every 2 hours for 20 minutes while awake for the next 2 days.  Drink extra fluids (not alcohol).  Take a hot or warm shower or bath once or twice a day to increase blood flow to sore muscles. This will help you "limber up".  Activity as tolerated. Lifting may aggravate neck or back pain.  Only take over-the-counter or prescription medicines for pain, discomfort, or fever as directed by your caregiver. Do not use aspirin. This may increase bruising or increase bleeding if there are small areas where this is happening. SEEK IMMEDIATE MEDICAL CARE IF:  Numbness, tingling, weakness, or problem with the use of your arms or legs.  A severe headache is not relieved with medications.  There is a change in bowel or bladder control.  Increasing pain in any areas of the body.  Short of breath or dizzy.  Nauseated, vomiting, or sweating.  Increasing belly (abdominal) discomfort.  Blood in urine, stool, or vomiting blood.  Pain in either shoulder in an area where a shoulder strap would be.  Feelings of lightheadedness or if you have a fainting episode. Sometimes it is not possible to identify all injuries immediately after the trauma. It is important that you continue to monitor your condition after the emergency department visit. If you feel you are not improving, or improving more slowly than should be expected, call your physician. If you feel your symptoms (problems) are worsening, return to the Emergency  Department immediately. Document Released: 01/24/2001 Document Revised: 07/23/2011 Document Reviewed: 12/17/2007 [facility] Patient Information 2015 [place], [place]. This information is not intended to replace advice given to you by your health care provider. Make sure you discuss any questions you have with your health care provider.

## 2015-02-02 IMAGING — CR RIGHT THUMB 2+V
1 series · 2 of 2 positions shown · non-contrast
Comparison: None.

CLINICAL DATA: Crush injury to the left thumb.

EXAM:
RIGHT THUMB 2+V

[Series 1: pa · 0.17mm/px · 2 of 2 slices shown]
[im 1/2]
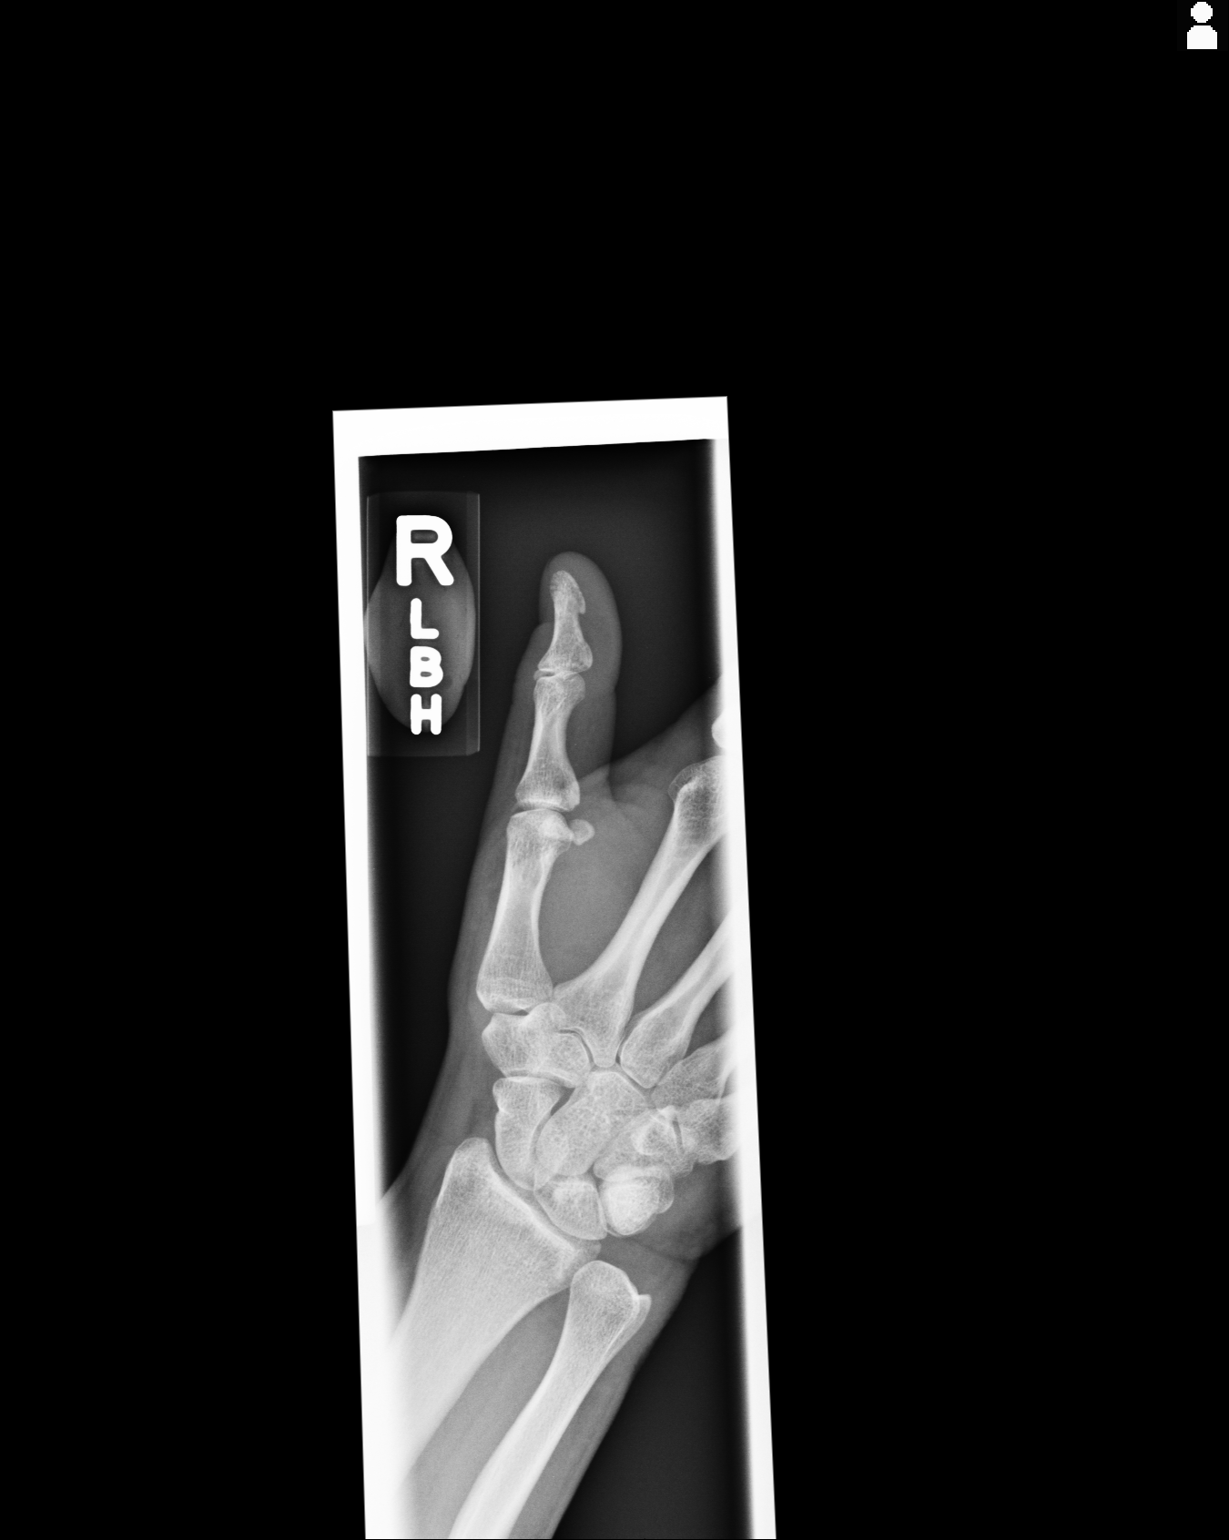
[im 2/2]
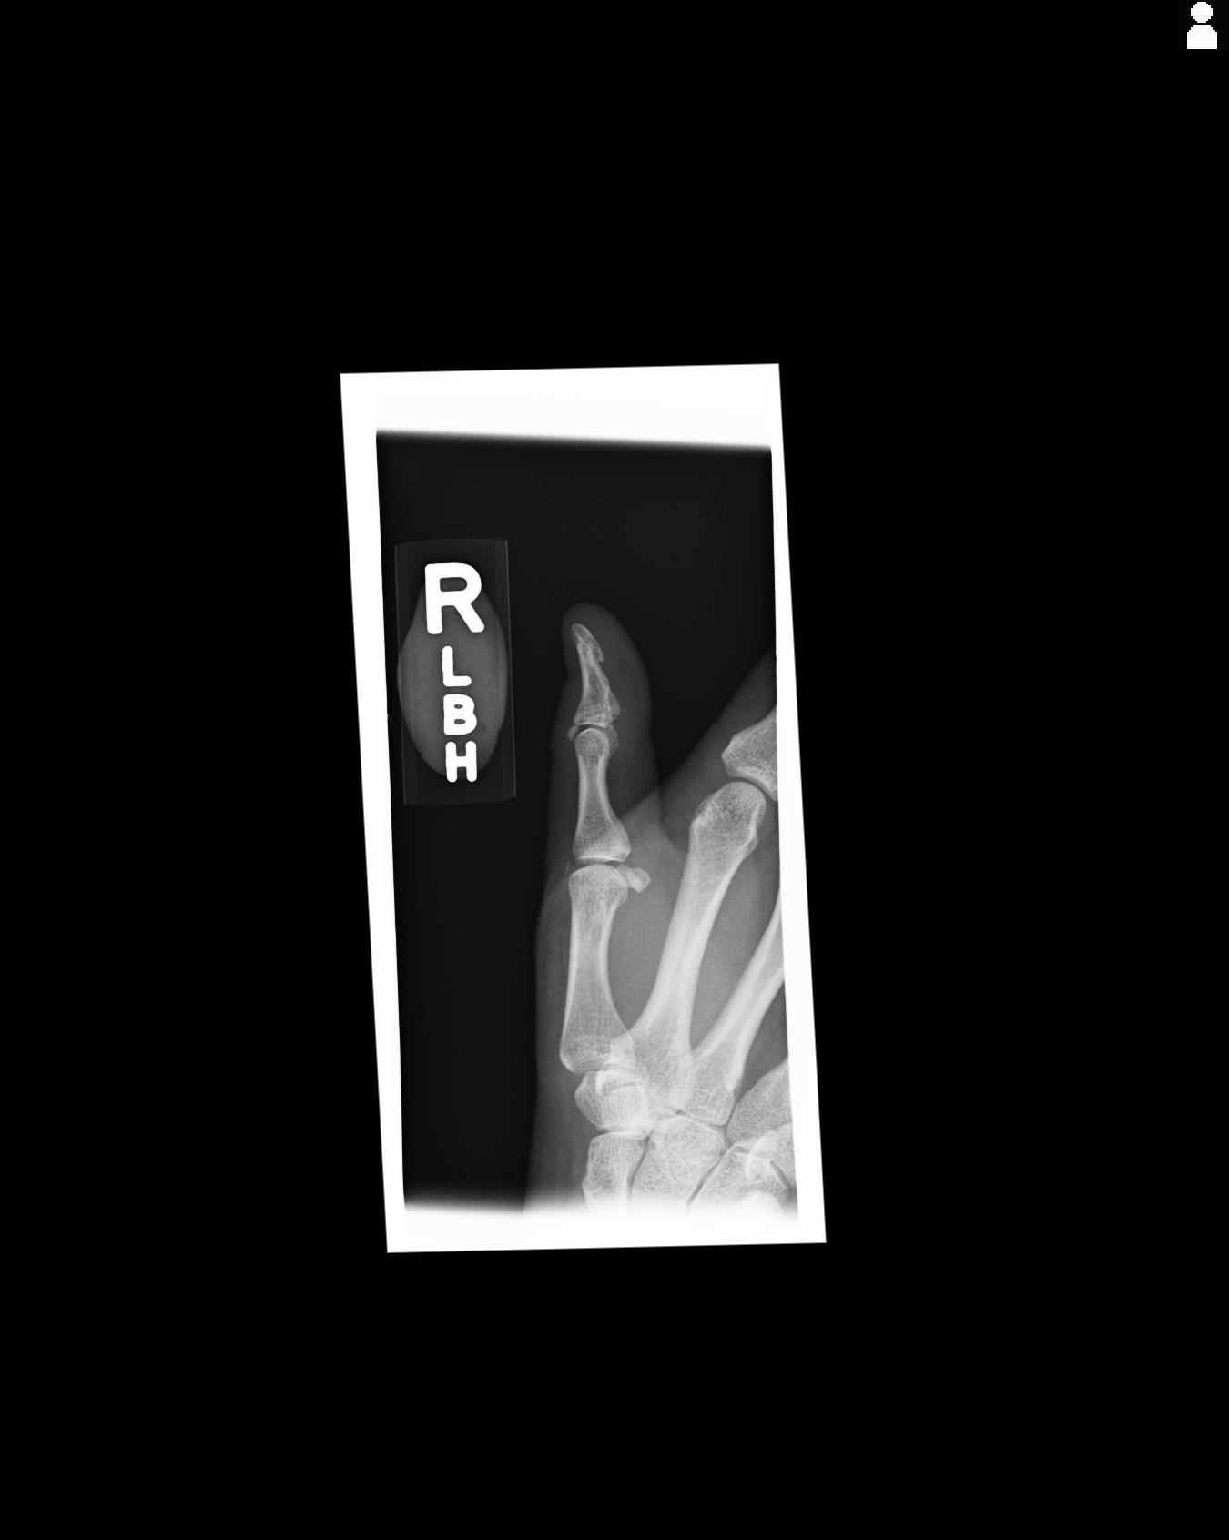

[2 of 2 positions shown; findings below may reference images not displayed]

FINDINGS: No acute osseous or joint abnormality. An osseous density seen along
the interphalangeal joint, on the lateral view, appears well
corticated.
IMPRESSION: No acute osseous abnormality.

## 2015-04-23 IMAGING — CT CT CERVICAL SPINE WITHOUT CONTRAST
4 of 5 series · 15 of 33 positions shown, 18 images · non-contrast
Comparison: 06/08/2012

CLINICAL DATA: Sudden onset of head and neck pain 1 hr prior to
arrival while sitting at home. Recent head injury 2 weeks prior,
falling off horse.

EXAM:
CT HEAD WITHOUT CONTRAST
CT CERVICAL SPINE WITHOUT CONTRAST
TECHNIQUE: Multidetector CT imaging of the head and cervical spine was
performed following the standard protocol without intravenous
contrast. Multiplanar CT image reconstructions of the cervical spine
were also generated.

[Series 5: c spine soft · axial · 0.36mm/px · z∈[+172,+212]mm · 2 of 80 slices shown]
[im 20/80  soft-tissue]
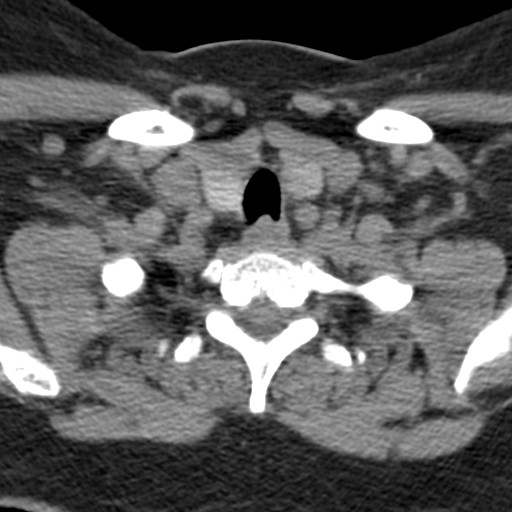
[im 40/80  soft-tissue]
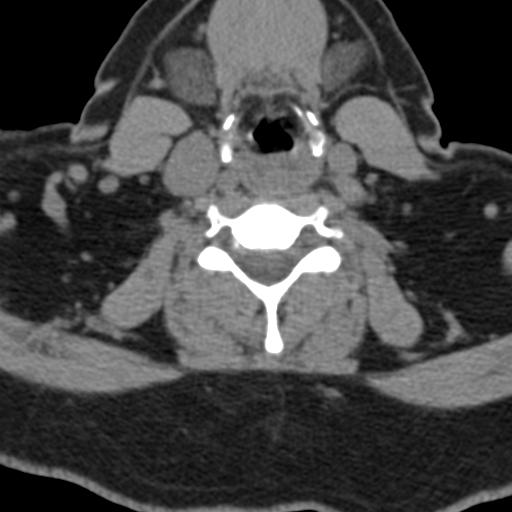

[Series 6: sag bone · sagittal · 0.35mm/px · 5 of 76 slices shown, 6 images]
[im 26/76  bone]
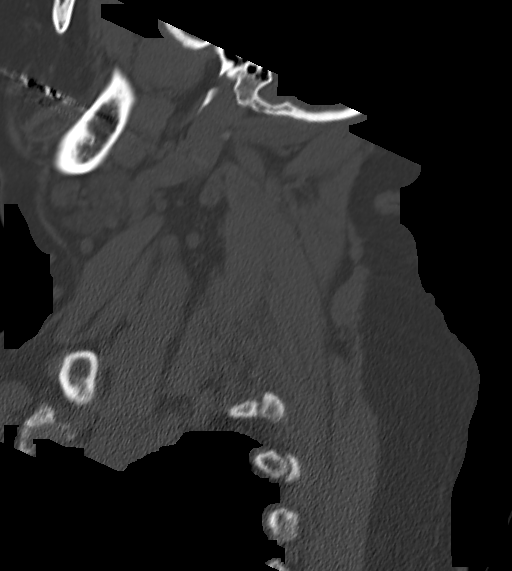
[im 32/76  bone]
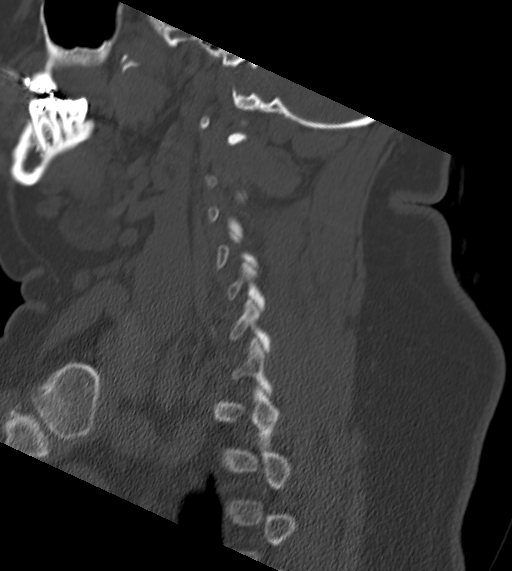
[im 38/76  soft-tissue]
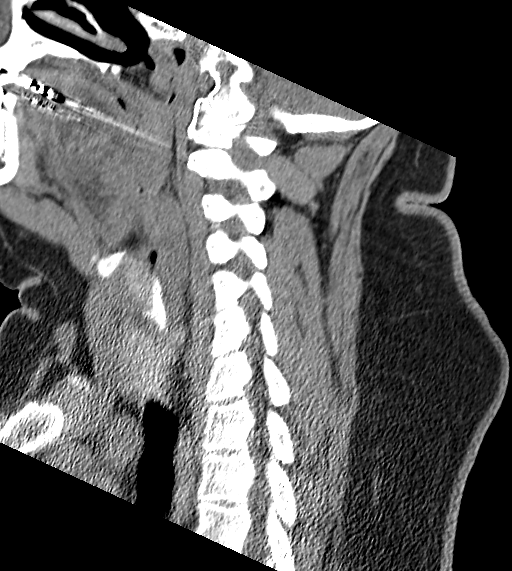
[im 38/76  bone]
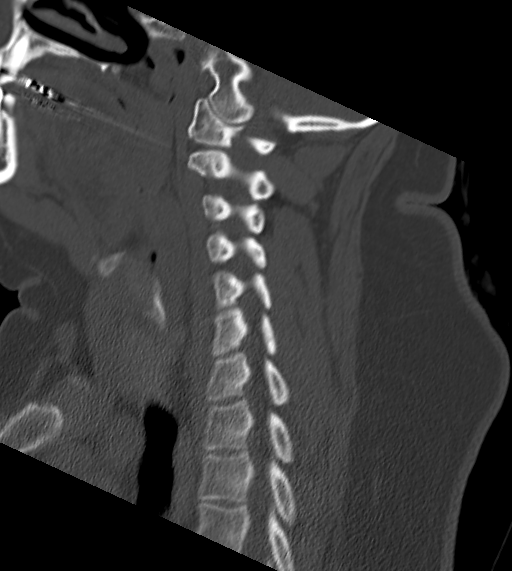
[im 44/76  bone]
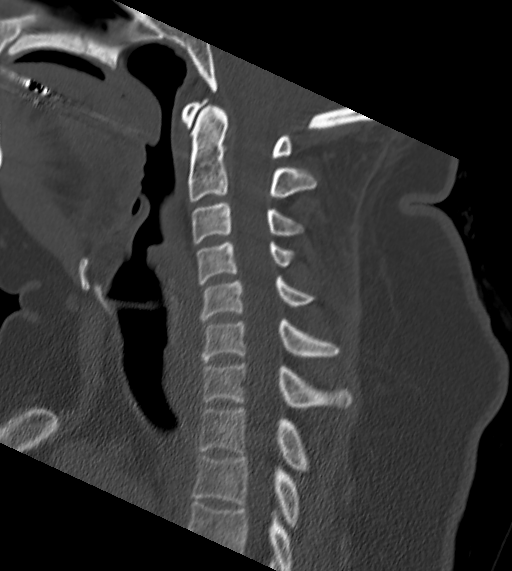
[im 51/76  bone]
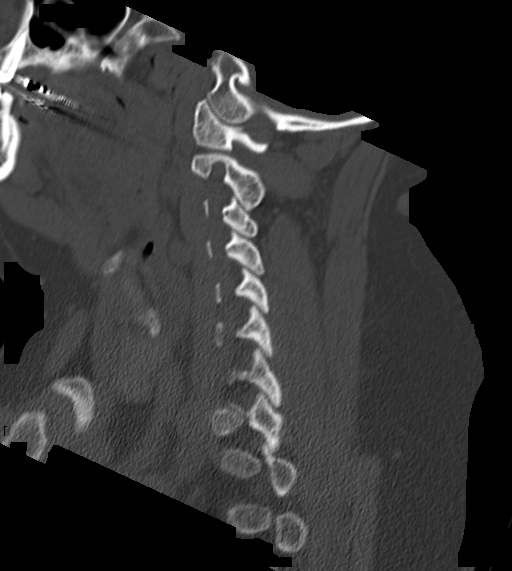

[Series 7: cor bone · coronal · 0.31mm/px · 3 of 79 slices shown]
[im 23/79  bone]
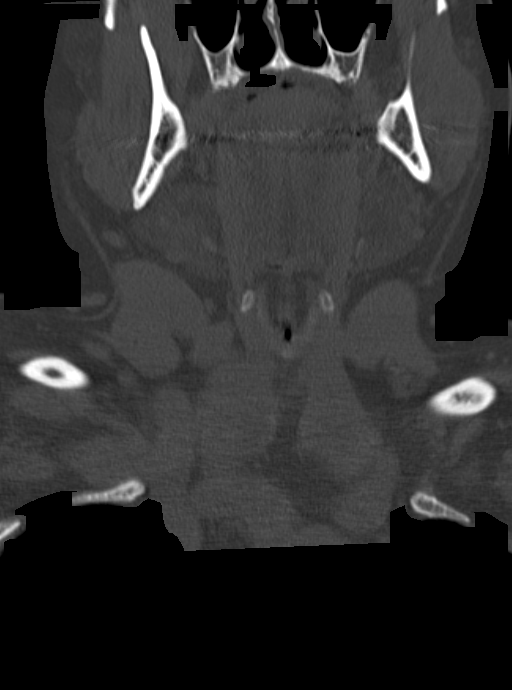
[im 34/79  bone]
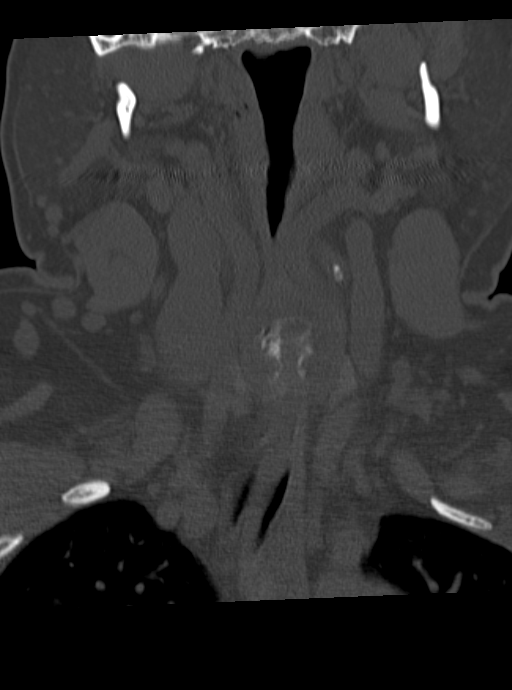
[im 45/79  bone]
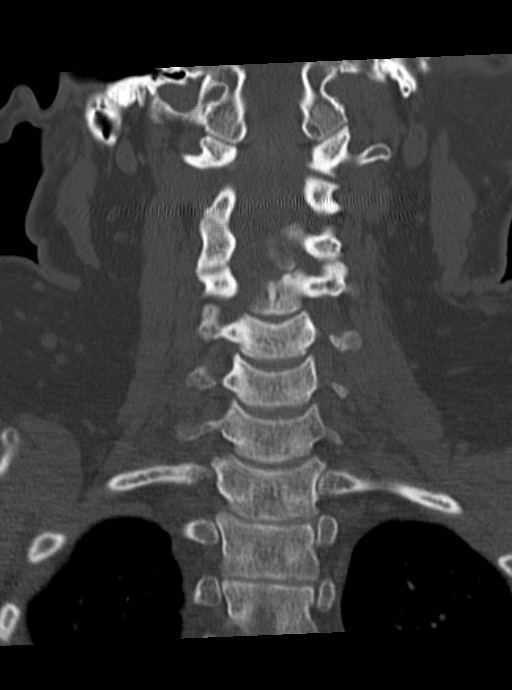

[Series 8: orthogonal axials · axial · 0.29mm/px · z∈[+119,+248]mm · 5 of 108 slices shown, 7 images]
[im 18/108  soft-tissue]
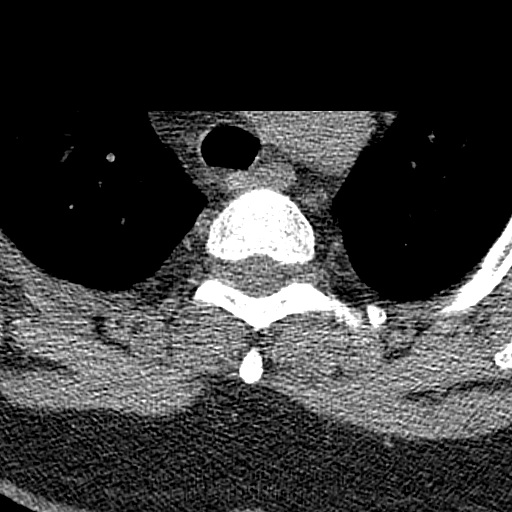
[im 18/108  bone]
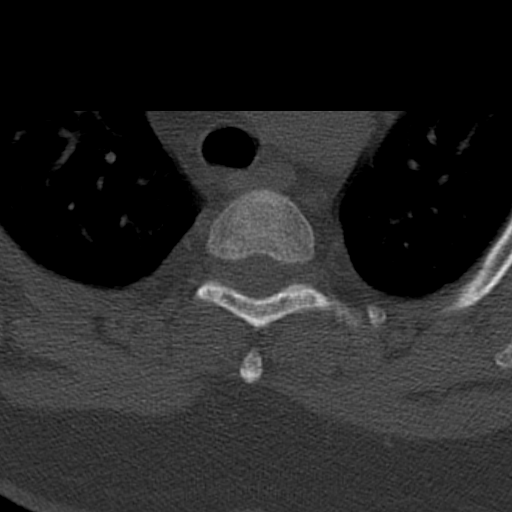
[im 36/108  bone]
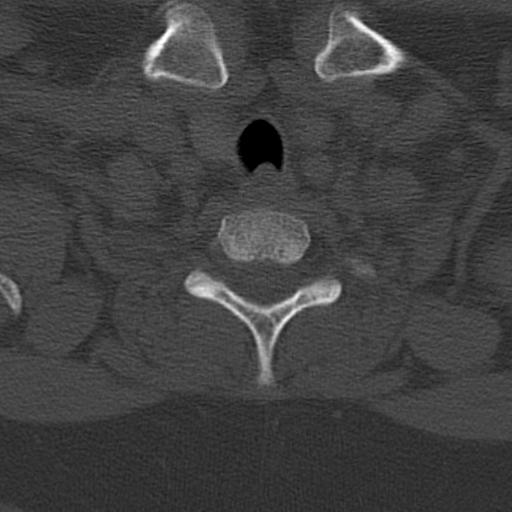
[im 54/108  bone]
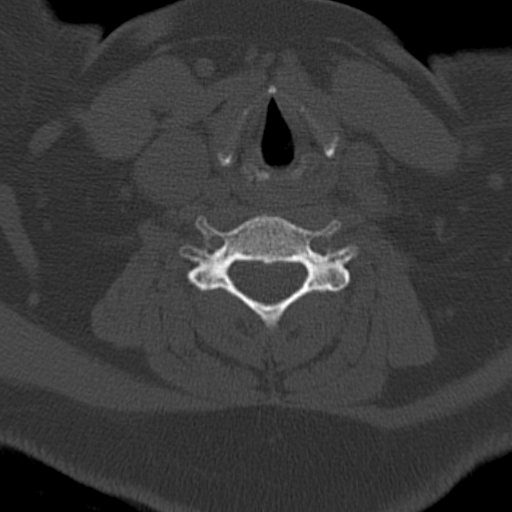
[im 72/108  bone]
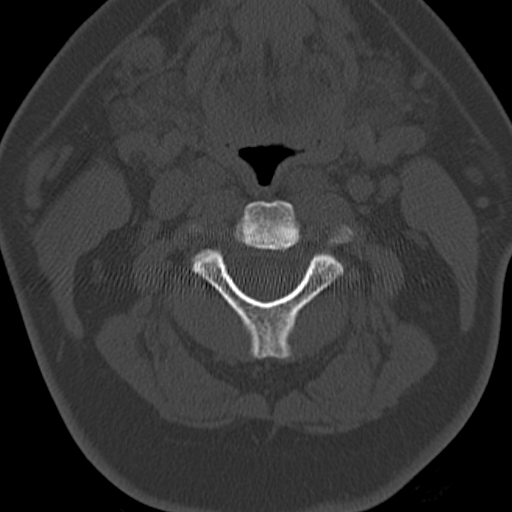
[im 90/108  soft-tissue]
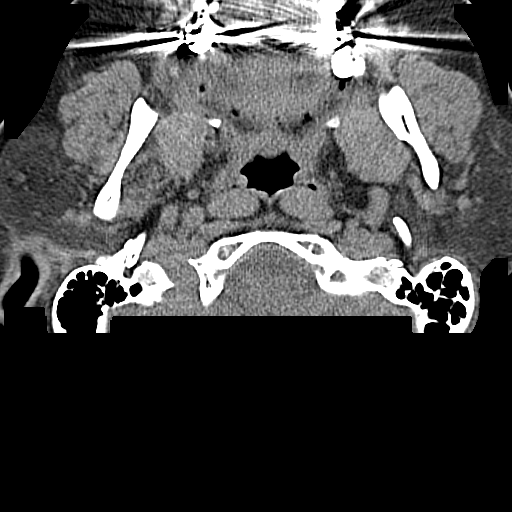
[im 90/108  bone]
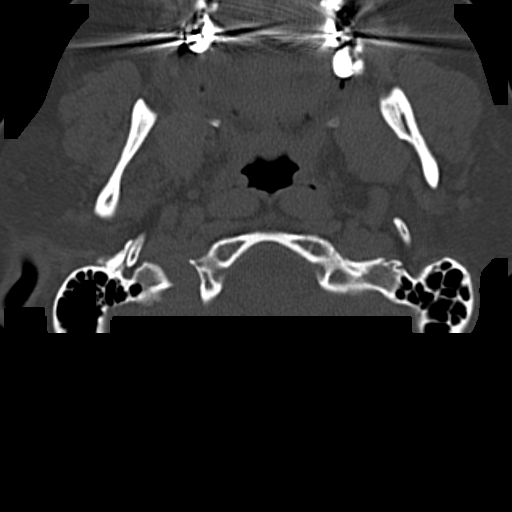

[15 of 33 positions shown; findings below may reference images not displayed]

FINDINGS: CT HEAD FINDINGS

No intracranial hemorrhage, mass effect, or midline shift. No
hydrocephalus. The basilar cisterns are patent. No evidence of
territorial infarct. No intracranial fluid collection. Calvarium is
intact. Included paranasal sinuses and mastoid air cells are well
aerated.

CT CERVICAL SPINE FINDINGS

Mild straightening of normal lordosis, unchanged from prior. No
listhesis. Vertebral body heights and intervertebral disc spaces are
preserved. There is no fracture. The dens is intact. There are no
jumped or perched facets. No prevertebral soft tissue edema.
IMPRESSION: 1.  No acute intracranial abnormality.
2. No fracture or subluxation of the cervical spine.

## 2015-08-20 IMAGING — CR DG LUMBAR SPINE 2-3V
1 series · 3 of 3 positions shown · non-contrast
Comparison: CT abdomen pelvis - 10/17/2012

CLINICAL DATA: Post fall, landing on tailbone last night, now with
low back pain shooting down the bilateral legs.

EXAM:
LUMBAR SPINE - 2-3 VIEW

[Series 1: t lumbar spine ap · 0.14mm/px · 3 of 3 slices shown]
[im 1/3]
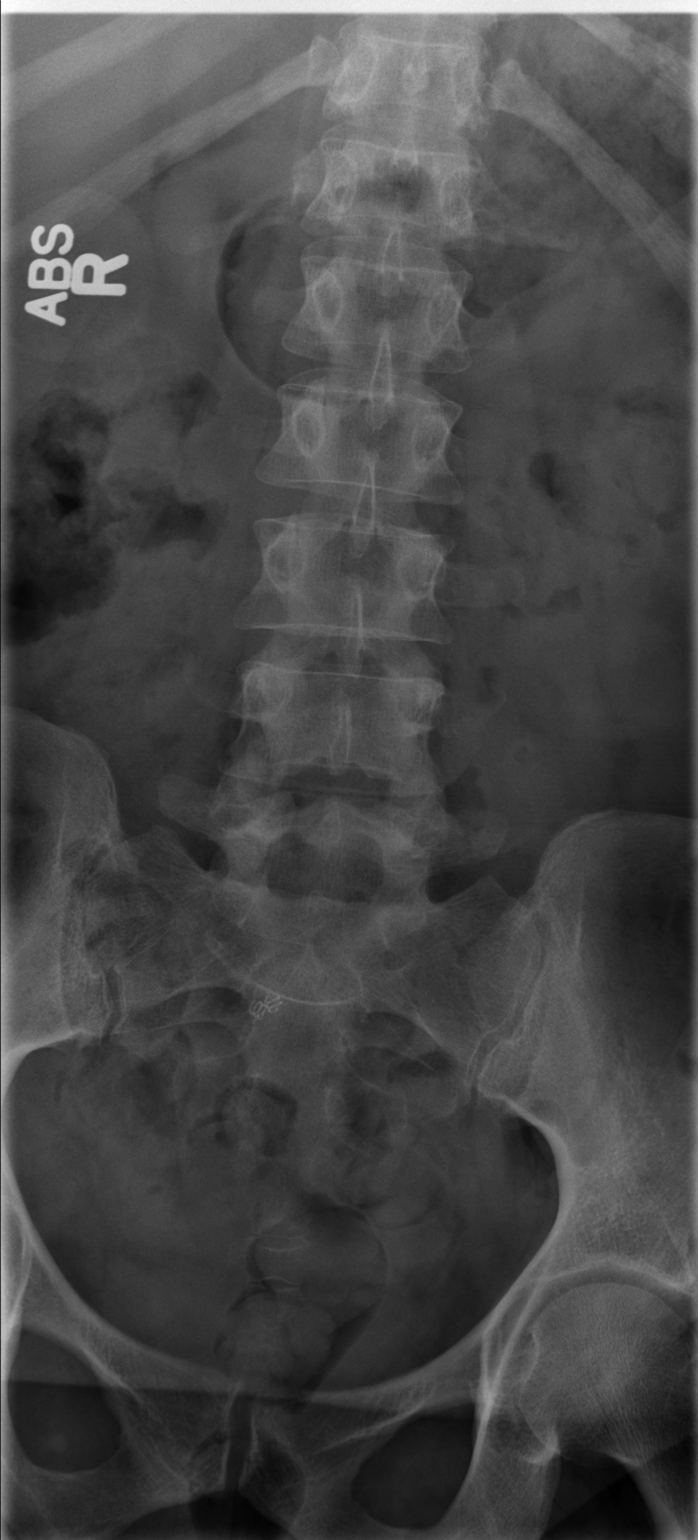
[im 2/3]
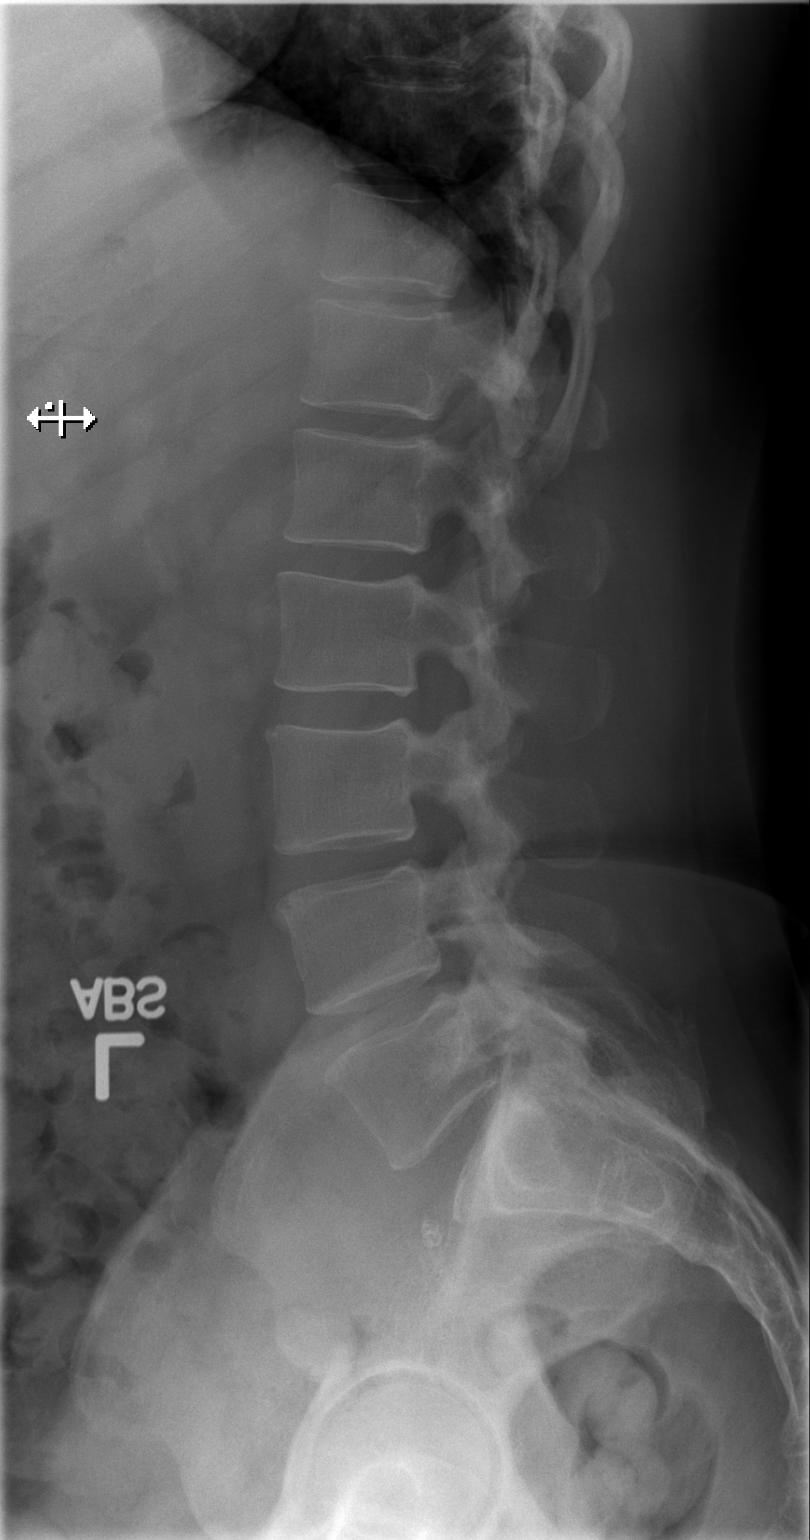
[im 3/3]
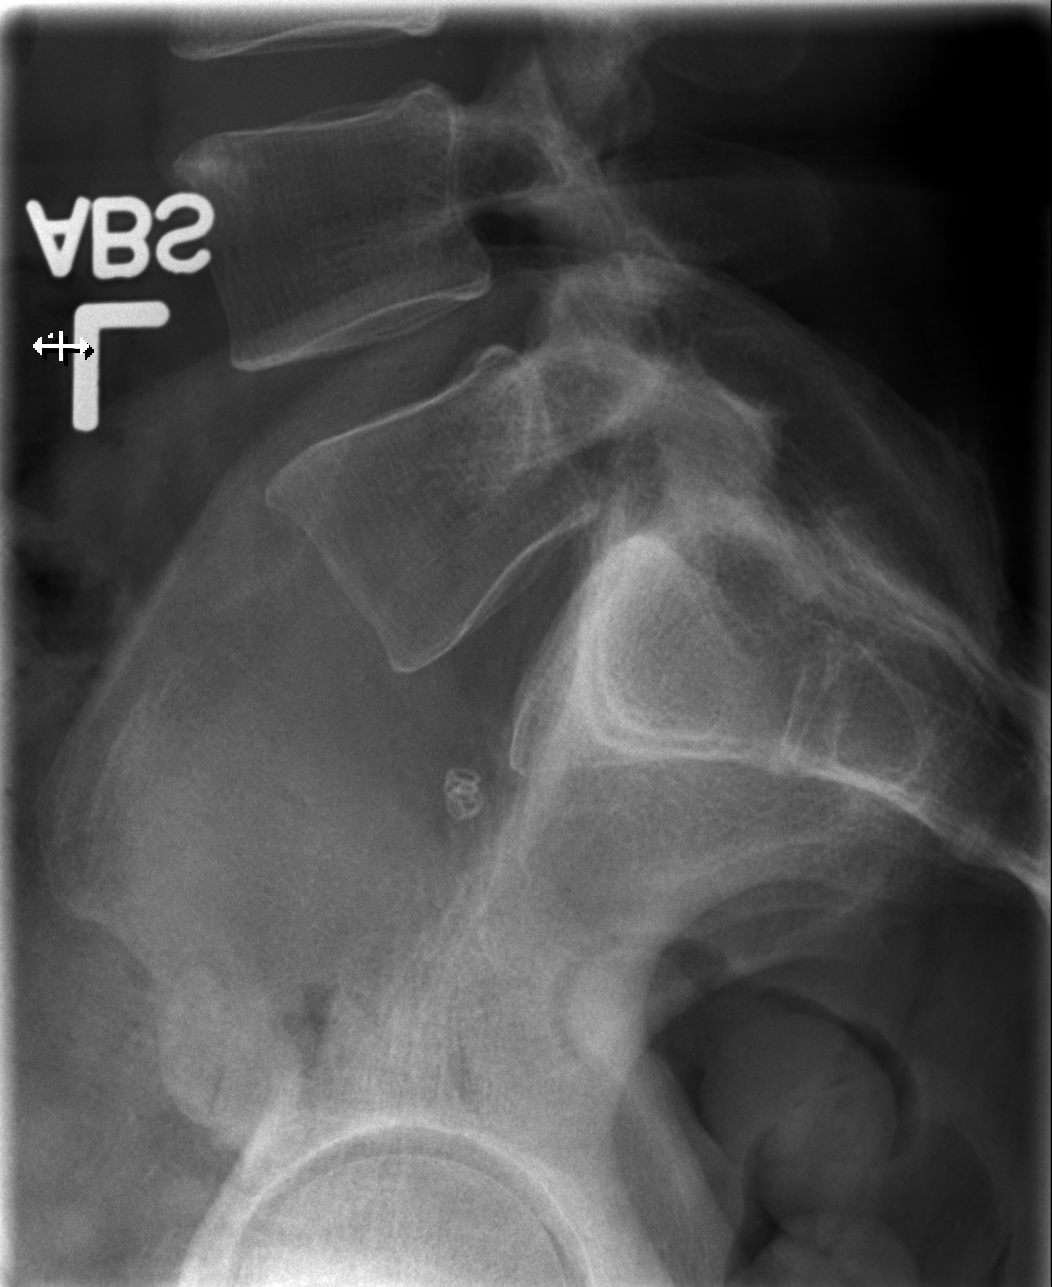

[3 of 3 positions shown; findings below may reference images not displayed]

FINDINGS: There are 5 non rib-bearing lumbar type vertebral bodies with
diminutive ribs seen bilaterally at T12.

Normal alignment of the lumbar spine. No anterolisthesis or
retrolisthesis.

Lumbar vertebral body heights are preserved. There is mild DDD
throughout the lumbar spine, worse at L3-L4 and to a lesser extent,
L4-L5 with disc space height loss, endplate irregularity and
sclerosis.

Coils overlying the lower sacrum. Regional soft tissues appear
otherwise normal.
IMPRESSION: 1. No acute findings.
2. Mild multilevel lumbar spine DDD, worse at L3-L4.

## 2015-09-02 IMAGING — US US BREAST*R* LIMITED INC AXILLA
1 series · 3 of 3 positions shown · non-contrast
Comparison: 10/08/2012, 01/23/2010

CLINICAL DATA: The patient describes pain in the 12 o'clock
position of the right breast, pain in the 3 o'clock position of the
right breast, occasional spontaneous clear or milky discharge from
the right breast over the course of the past 3 months, and
occasional Roqea Nor crusting on the left nipple which she
describes as bloody discharge occurring at least once a week for the
past 3 months

EXAM:
DIGITAL DIAGNOSTIC  BILATERAL MAMMOGRAM WITH CAD
ULTRASOUND BILATERAL BREAST

[Series 1: us breast*right* limited inc axilla · 0.08mm/px · 3 of 3 slices shown]
[im 1/3]
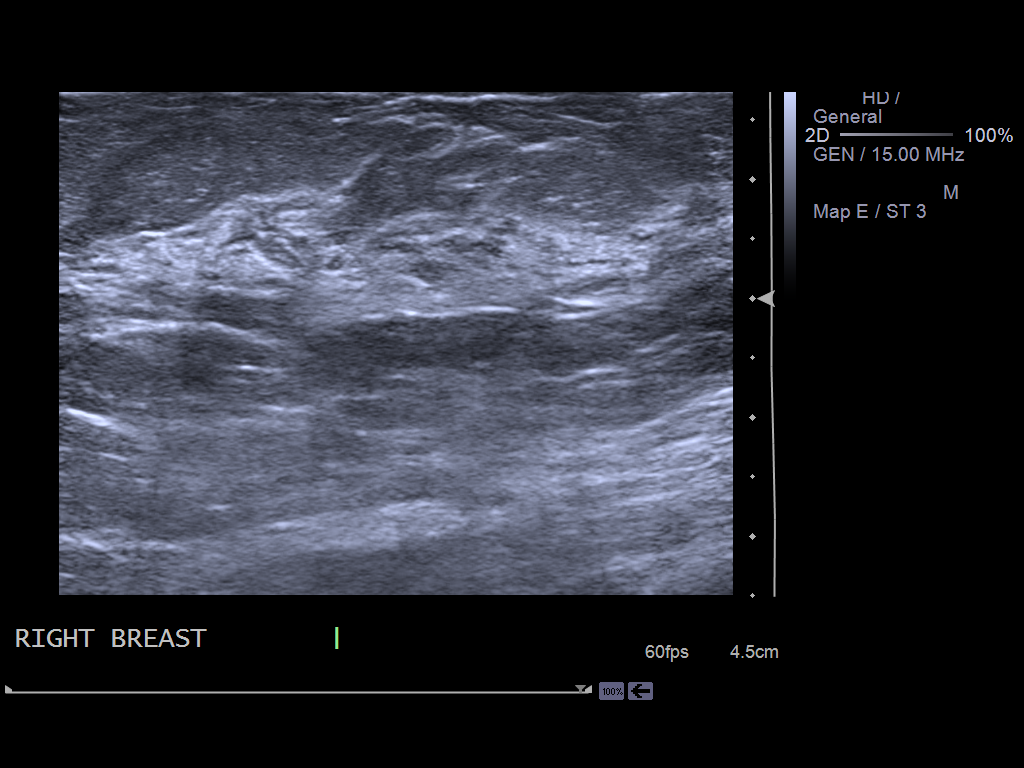
[im 2/3]
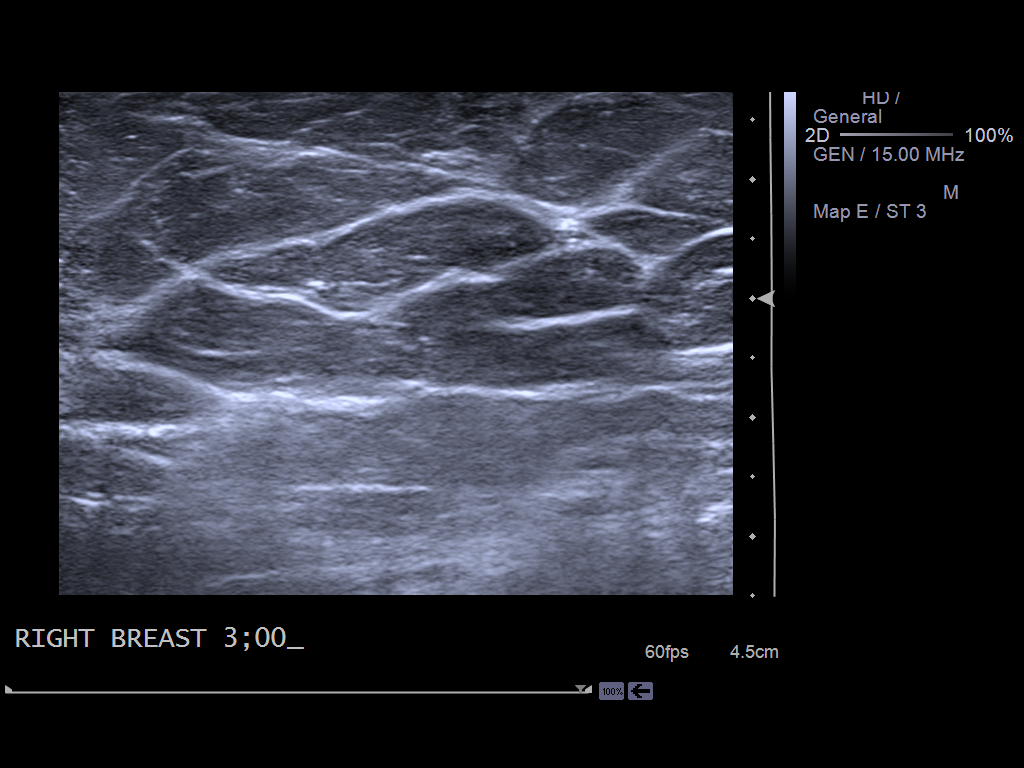
[im 3/3]
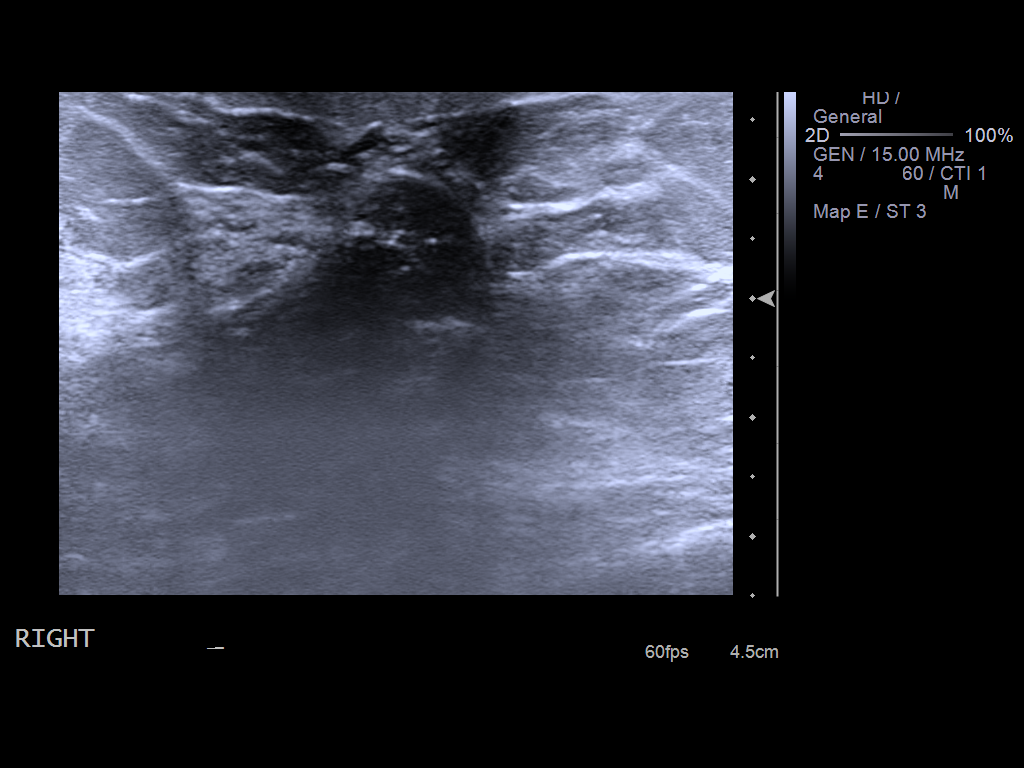

[3 of 3 positions shown; findings below may reference images not displayed]

ACR Breast Density Category b: There are scattered areas of
fibroglandular density.
FINDINGS: There are no mammographic abnormalities on the right.

On the left, there is a new circumscribed 6 mm oval isodense mass
upper inner quadrant middle third depth.

Mammographic images were processed with CAD.

On physical exam, there are no palpable abnormalities on either
side. No discharge could be obtained with compression today. No
dilated duct orifice is identified on either nipple.

Ultrasound is performed, showing normal glandular tissue in the 12
o'clock position of the right breast. There are no abnormalities in
the 3 o'clock position of the right breast. There is minimal
subareolar duct ectasia with no intraductal mass on the right.

On the left, the mammographic nodule corresponds to a simple cyst
measuring 7 x 3 x 5 mm located in the 10 o'clock position 6 cm from
the nipple. There is mild subareolar duct ectasia with no
intraductal mass.
IMPRESSION: No suspicious findings on either side. No findings identified to
account for nipple discharge. The patient is concerned about bloody
left nipple discharge, and clear right nipple discharge, although no
discharge could be obtained today. The patient does not appear to be
a candidate for ductography at this time given the absence of
current discharge and the absence of duct orifice dilatation.

RECOMMENDATION:
Bilateral breast MRI with contrast is recommended to evaluate for
intermittent spontaneous nipple discharge. Breast MRI can be
scheduled at [REDACTED] location. The
patient stated that she would contact her referring physician by
11/02/2013 to discuss this recommendation.

I have discussed the findings and recommendations with the patient.
Results were also provided in writing at the conclusion of the
visit. If applicable, a reminder letter will be sent to the patient
regarding the next appointment.

BI-RADS CATEGORY  0: Incomplete. Need additional imaging evaluation
and/or prior mammograms for comparison.

## 2015-10-27 ENCOUNTER — Ambulatory Visit
Admission: EM | Admit: 2015-10-27 | Discharge: 2015-10-27 | Disposition: A | Discharge status: Hospice | Payer: Medicare Other | Attending: Family Medicine | Admitting: Family Medicine

## 2015-10-27 DIAGNOSIS — R0602 Shortness of breath: Secondary | ICD-10-CM | POA: Diagnosis not present

## 2015-10-27 DIAGNOSIS — R071 Chest pain on breathing: Secondary | ICD-10-CM | POA: Diagnosis not present

## 2015-10-27 HISTORY — DX: Type 2 diabetes mellitus without complications: E11.9

## 2015-10-27 MED ORDER — ASPIRIN 81 MG PO CHEW
324.0000 mg | CHEWABLE_TABLET | Freq: Once | ORAL | Status: AC
  Administered 2015-10-27: 324 mg via ORAL

## 2015-10-27 MED ORDER — NITROGLYCERIN 0.4 MG SL SUBL
0.4000 mg | SUBLINGUAL_TABLET | SUBLINGUAL | Status: DC | PRN
  Administered 2015-10-27: 0.4 mg via SUBLINGUAL

## 2015-10-27 NOTE — ED Provider Notes (Signed)
CSN: [id number]     Arrival date & time 10/27/15  0802 History   None   Nurses notes were reviewed. Chief Complaint  Patient presents with  . Chest Pain    Individual chest pain. She states chest pain started about 2 days ago however last night became very intense as she puts it she thought she was going to die. She states the pain was very sharp and very discomforting and cut off of breath. She reports difficulty breathing and the pain going down into her back. Since going to the ED she decided to wait and clearly stated at our door this morning upon opening to be seen. She denies history of coronary artery disease in the past she has a history of Crohn's disease fibromyalgia back pain and diabetes. She's had shoulder surgery small intestinal surgery breast surgery and foot surgery. Her sister has had a heart attack when she was in the 50s mother's is also had a myocardial infarction in father has had a history of heart disease as well. She states she does not smoke. She is allergic to erythromycin and doxycycline.   She states his pain of breath and keeps him from being able to take a deep breath at this time.       (Consider location/radiation/quality/duration/timing/severity/associated sxs/prior Treatment) Patient is a 45 y.o. female presenting with chest pain. The history is provided by the patient. No language interpreter was used.  Chest Pain Pain location:  Substernal area Pain radiates to:  Mid back Pain radiates to the back: yes   Pain severity:  Severe Duration:  2 days Progression:  Worsening Chronicity:  New Context: breathing and at rest   Relieved by:  Nothing Worsened by:  Nothing tried Associated symptoms: anxiety, palpitations and shortness of breath     Past Medical History  Diagnosis Date  . Crohn's disease ([place])   . Fibromyalgia   . Back pain   . Diabetes [facility])    Past Surgical History  Procedure Laterality Date  . Shoulder surgery    . Small intestine  surgery    . Breast surgery    . Foot surgery Left    Family History  Problem Relation Age of Onset  . Heart attack Sister 57  . Heart disease Father    Social History  Substance Use Topics  . Smoking status: Never Smoker   . Smokeless tobacco: None  . Alcohol Use: No   OB History    No data available     Review of Systems  Respiratory: Positive for shortness of breath.   Cardiovascular: Positive for chest pain and palpitations.  All other systems reviewed and are negative.   Allergies  Bee venom; Doxycycline; Erythromycin; and Shellfish allergy  Home Medications   Prior to Admission medications   Medication Sig Start Date End Date Taking? Authorizing Provider  clonazePAM (KLONOPIN) 1 MG tablet Take 1 mg by mouth 2 (two) times daily.    Historical Provider, MD  EPINEPHrine 0.3 mg/0.3 mL IJ SOAJ injection Inject 0.3 mg into the muscle once.    Historical Provider, MD  meloxicam (MOBIC) 15 MG tablet Take 1 tablet (15 mg total) by mouth daily. 09/20/14   Frederich Cha, MD  OxyCODONE (OXYCONTIN) 10 mg T12A 12 hr tablet Take 10 mg by mouth every 12 (twelve) hours.    Historical Provider, MD  oxyCODONE-acetaminophen (PERCOCET/ROXICET) 5-325 MG per tablet Take by mouth every 4 (four) hours as needed for severe pain.    Historical  Provider, MD  topiramate (TOPAMAX) 100 MG tablet Take 100 mg by mouth 2 (two) times daily.    Historical Provider, MD   Meds Ordered and Administered this Visit   Medications  nitroGLYCERIN (NITROSTAT) SL tablet 0.4 mg (0.4 mg Sublingual Given 10/27/15 0825)  aspirin chewable tablet 324 mg (324 mg Oral Given 10/27/15 0821)    BP 141/89 mmHg  Pulse 97  Temp(Src) 98.6 F (37 C) (Tympanic)  Resp 15  SpO2 97% No data found.   Physical Exam  Constitutional: She is oriented to person, place, and time. She appears well-nourished. She is active. She appears distressed.  HENT:  Head: Normocephalic and atraumatic.  Eyes: Conjunctivae are normal. Pupils  are equal, round, and reactive to light.  Neck: Normal range of motion. Neck supple.  Cardiovascular: Normal rate.   Pulmonary/Chest: Effort normal. She has decreased breath sounds. She exhibits tenderness.    Abdominal: Soft. Bowel sounds are normal.  Musculoskeletal: Normal range of motion.  Neurological: She is alert and oriented to person, place, and time.  Skin: Skin is warm and dry.  Psychiatric: She has a normal mood and affect.  Vitals reviewed.   ED Course  Procedures (including critical care time)  Labs Review Labs Reviewed - No data to display  Imaging Review No results found.   Visual Acuity Review  Right Eye Distance:   Left Eye Distance:   Bilateral Distance:    Right Eye Near:   Left Eye Near:    Bilateral Near:         MDM   1. Chest pain on breathing   2. Shortness of breath    Will transfer patient to the ED. She has asked to go to [facility] ED and describes going to take her to [place] she's given 4 baby aspirin is here single line was started and 1 nitroglycerin was also given without much relief while she was here.    ED ECG REPORT I, Madgie Dhaliwal H, the attending physician, personally viewed and interpreted this ECG.   Date: 10/27/2015  EKG Time:08:13:01  Rate: 88  Rhythm: NSR  Axis:37  Intervals:none   ST&T Change: none specific changes  No significant changes from 03/11/2014. NonspecificT wave changes were present  Frederich Cha, MD 10/27/15 [phone]

## 2015-10-27 NOTE — Discharge Instructions (Signed)

## 2015-10-27 NOTE — ED Notes (Signed)
Patient complains of chest pain that started 2 days ago. Patient states that last night "I felt like I was dying". Patient is also having some shortness of breath. Patient describes chest pain as pressure in chest.

## 2015-11-21 ENCOUNTER — Other Ambulatory Visit: Payer: Self-pay | Admitting: Family

## 2015-11-21 DIAGNOSIS — Z1231 Encounter for screening mammogram for malignant neoplasm of breast: Secondary | ICD-10-CM

## 2015-11-26 ENCOUNTER — Ambulatory Visit
Admission: EM | Admit: 2015-11-26 | Discharge: 2015-11-26 | Disposition: A | Discharge status: Home / Self Care | Payer: Medicare Other | Attending: Internal Medicine | Admitting: Internal Medicine

## 2015-11-26 DIAGNOSIS — H15102 Unspecified episcleritis, left eye: Secondary | ICD-10-CM

## 2015-11-26 MED ORDER — SULFACETAMIDE SODIUM 10 % OP SOLN: 1.0000 [drp] | Freq: Four times a day (QID) | OPHTHALMIC | Status: AC

## 2015-11-26 MED ORDER — CARBOXYMETHYLCELLULOSE SODIUM 0.5 % OP SOLN: 2.0000 [drp] | OPHTHALMIC | Status: AC

## 2015-11-26 NOTE — Discharge Instructions (Signed)
Artificial tears and cool compresses can be helpful for episcleritis, which typically resolves by itself in 7-10 days.  Followup with your eye doctor in a few days if not starting to improve.  Prescriptions for artificial tears and an antibiotic eye drop were sent to the [facility] in [place].   Aleve otc (anti inflammatory) 1 tablet twice daily may help with discomfort.    Scleritis and Episcleritis The outer part of the eyeball is covered with a tough fibrous covering called the sclera. It is the white part of the eye. This tough covering also has a thin membrane lying on top of it called the episclera.   When the sclera becomes red and sore (inflamed), it is called scleritis.  When the episclera becomes inflamed, it is called episcleritis. CAUSES   Scleritis is usually more severe and is associated with autoimmune diseases such as:  Rheumatoid arthritis.  Inflammations of the bowel such as Crohn's Disease (regional enteritis).  Ulcerative colitis.  Episcleritis usually has no known cause. SYMPTOMS  Both scleritis and episcleritis cause red patches or a nodule on the eye. DIAGNOSIS  This condition should be examined by an ophthalmologist. This is because very strong medications that have side effects to the body and eye may be required to treat severe attacks. Further investigations into the patient's general health may be necessary. TREATMENT   Episcleritis tends to get better without treatment within a week or two.  Scleritis is more severe. Often, your caregiver will prescribe steroids by mouth (orally) or as drops in the eye. This treatment helps lessen the redness and soreness (inflammation). HOME CARE INSTRUCTIONS   Take all medications as directed.  Keep your follow-up appointments as directed.  Avoid irritation of the involved eye(s).  Stop using hard or soft contact lenses until your caregiver tells you that it is safe to use them. SEEK MEDICAL CARE IF:   Redness or  irritation gets worse.  You develop pain or sensitivity to light.  You develop any change in vision in the involved eye(s).   This information is not intended to replace advice given to you by your health care provider. Make sure you discuss any questions you have with your health care provider.   Document Released: 04/24/2001 Document Revised: 07/23/2011 Document Reviewed: 08/26/2008 Elsevier Interactive Patient Education [employer].

## 2015-11-26 NOTE — ED Provider Notes (Signed)
CSN: [id number]     Arrival date & time 11/26/15  [id number] History   First MD Initiated Contact with Patient 11/26/15 [phone]     Chief Complaint  Patient presents with  . Burning Eyes   HPI Patient is a 45 year old lady who presents today with burning discomfort in both eyes, left greater than right, for the last 2 days.  She does not recall specific injury, but wonders if her 34-month-old grandbaby accidentally scratched her in her eye.  She does not report loss of vision. No drainage or mattering. No significant runny/congested nose, not coughing. No fever Eyes are very uncomfortable though particularly on the left, laterally. Has a remote history of a foreign body injury to one of her eyes while mowing, was hit by a piece of cactus.  She does not wear contacts. Does have a history of glaucoma and is followed by [facility].  Past Medical History  Diagnosis Date  . Crohn's disease ([place])   . Fibromyalgia   . Back pain   . Diabetes [facility])    Past Surgical History  Procedure Laterality Date  . Shoulder surgery    . Small intestine surgery    . Breast surgery    . Foot surgery Left    Family History  Problem Relation Age of Onset  . Heart attack Sister 80  . Heart disease Father    Social History  Substance Use Topics  . Smoking status: Never Smoker   . Smokeless tobacco: None  . Alcohol Use: No    Review of Systems  All other systems reviewed and are negative.   Allergies  Bee venom; Doxycycline; Erythromycin; and Shellfish allergy  Home Medications   Prior to Admission medications   Medication Sig Start Date End Date Taking? Authorizing Provider  clonazePAM (KLONOPIN) 1 MG tablet Take 1 mg by mouth 2 (two) times daily.   Yes Historical Provider, MD  topiramate (TOPAMAX) 100 MG tablet Take 100 mg by mouth 2 (two) times daily.   Yes Historical Provider, MD  EPINEPHrine 0.3 mg/0.3 mL IJ SOAJ injection Inject 0.3 mg into the muscle once.    Historical Provider, MD       BP 135/91 mmHg  Pulse 89  Temp(Src) 97.8 F (36.6 C) (Oral)  Resp 16  Ht 5\' 1"  (1.549 m)  Wt 159 lb (72.122 kg)  BMI 30.06 kg/m2  SpO2 98%  LMP 09/19/2012 Physical Exam  Constitutional: She is oriented to person, place, and time.  Alert, nicely groomed Looks tense  HENT:  Head: Atraumatic.  Ears are waxy Minimal nasal congestion  Eyes: Pupils are equal, round, and reactive to light.    Conjugate gaze. Patient has difficulty with vertical gaze and this is symmetric. Eye motion appears mildly restricted but is symmetric throughout. Bilateral conjunctiva mildly injected, with focal erythema and injection prominent in the left eye laterally. No corneal irregularity appreciated on penlight exam; floor seen exam is negative. Eyelids are not swollen, no significant erythema. No discharge present   Neck: Neck supple.  Cardiovascular: Normal rate.   Pulmonary/Chest: No respiratory distress.  Abdominal: She exhibits no distension.  Musculoskeletal: Normal range of motion.  No leg swelling  Neurological: She is alert and oriented to person, place, and time.  Skin: Skin is warm and dry.  No cyanosis Pink. No rash  Nursing note and vitals reviewed.   ED Course  Procedures (including critical care time)  Visual Acuity Review  Right Eye Distance: 20/30 Left Eye Distance: 20/40  Bilateral Distance: 20/25   MDM   1. Episcleritis of left eye    Artificial tears and cool compresses can be helpful for episcleritis, which typically resolves by itself in 7-10 days.  Followup with your eye doctor in a few days if not starting to improve.  Prescriptions for artificial tears and an antibiotic eye drop were sent to the [facility] in [place].   Aleve otc (anti inflammatory) 1 tablet twice daily may help with discomfort.    Meds ordered this encounter  Medications  . carboxymethylcellulose (REFRESH PLUS) 0.5 % SOLN    Sig: Place 2 drops into both eyes every 2 (two) hours.     Dispense:  30 mL    Refill:  0  . sulfacetamide (BLEPH-10) 10 % ophthalmic solution    Sig: Place 1-2 drops into both eyes 4 (four) times daily.    Dispense:  5 mL    Refill:  0      Sherlene Shams, MD 11/26/15 1324

## 2015-11-26 NOTE — ED Notes (Signed)
Patient complains of burning in her eyes, she says it feels like there is gravel in there. She states it hurts to blink it all started 2 days ago. She tried a saline solution, with no relief.

## 2015-11-29 ENCOUNTER — Ambulatory Visit
Admission: RE | Admit: 2015-11-29 | Discharge: 2015-11-29 | Disposition: A | Discharge status: Home / Self Care | Payer: Medicare Other | Source: Ambulatory Visit | Attending: Family | Admitting: Family

## 2015-11-29 DIAGNOSIS — Z1231 Encounter for screening mammogram for malignant neoplasm of breast: Secondary | ICD-10-CM

## 2015-12-05 ENCOUNTER — Other Ambulatory Visit: Payer: Self-pay | Admitting: Family

## 2015-12-05 DIAGNOSIS — N63 Unspecified lump in unspecified breast: Secondary | ICD-10-CM

## 2015-12-05 IMAGING — CR DG RIBS 2V*R*
5 series · 5 of 5 positions shown · non-contrast
Comparison: PA and lateral chest x-ray May 17, 2013

CLINICAL DATA: Atraumatic posterior lateral right chest wall pain

EXAM:
RIGHT RIBS - 2 VIEW

[chest pa]
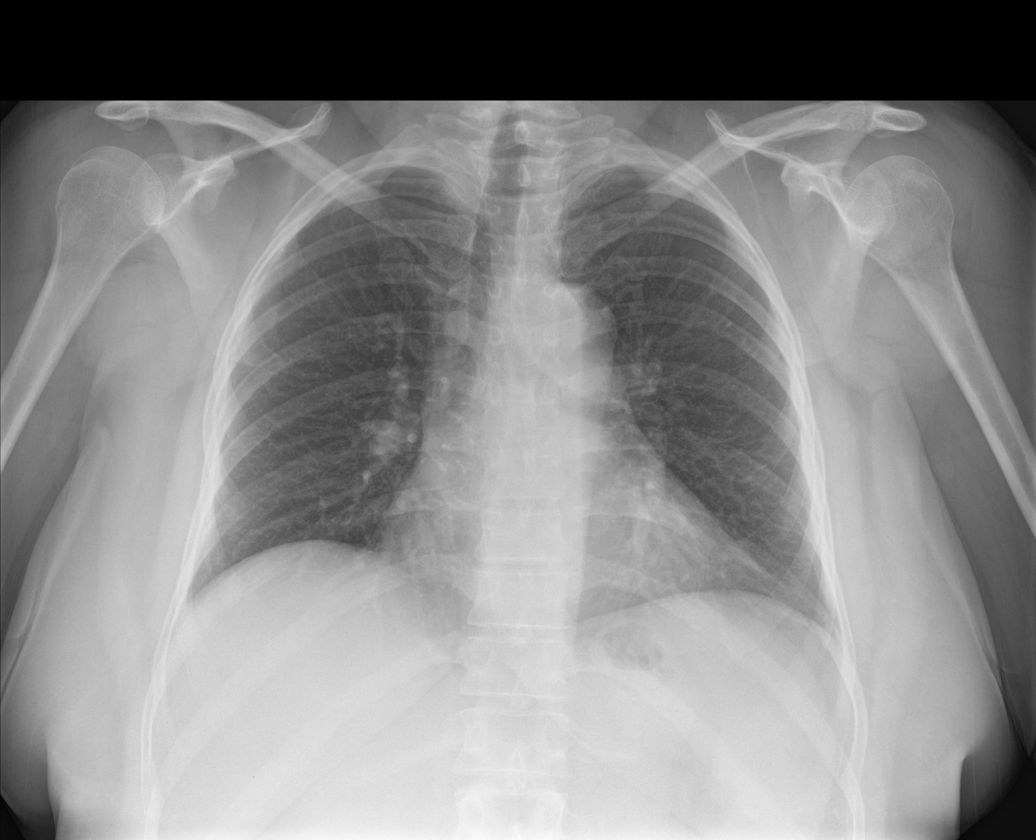

[rib pa (1 of 2)]
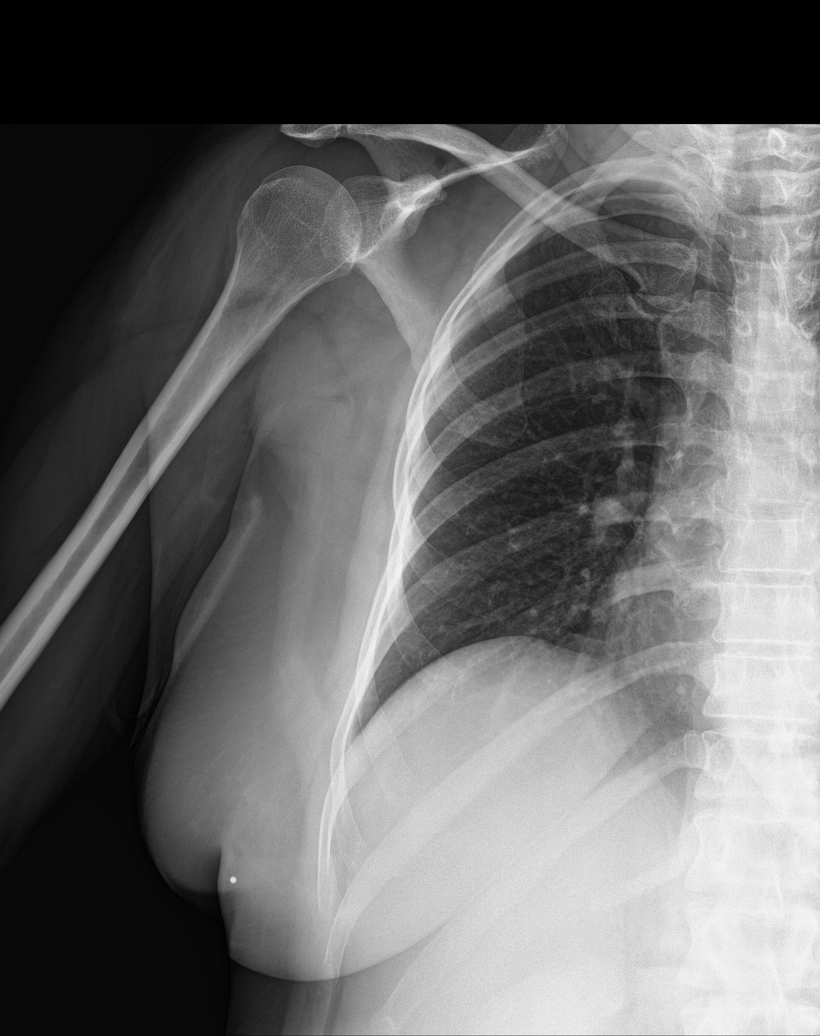

[rib obl (1 of 2)]
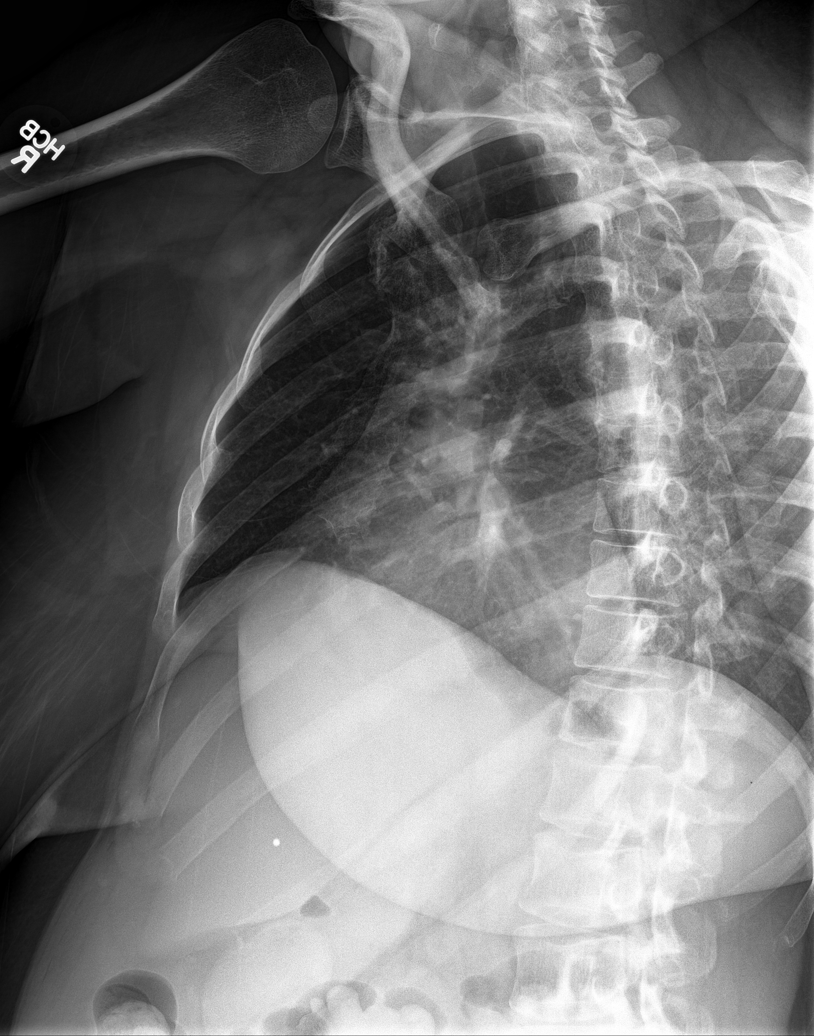

[rib pa (2 of 2)]
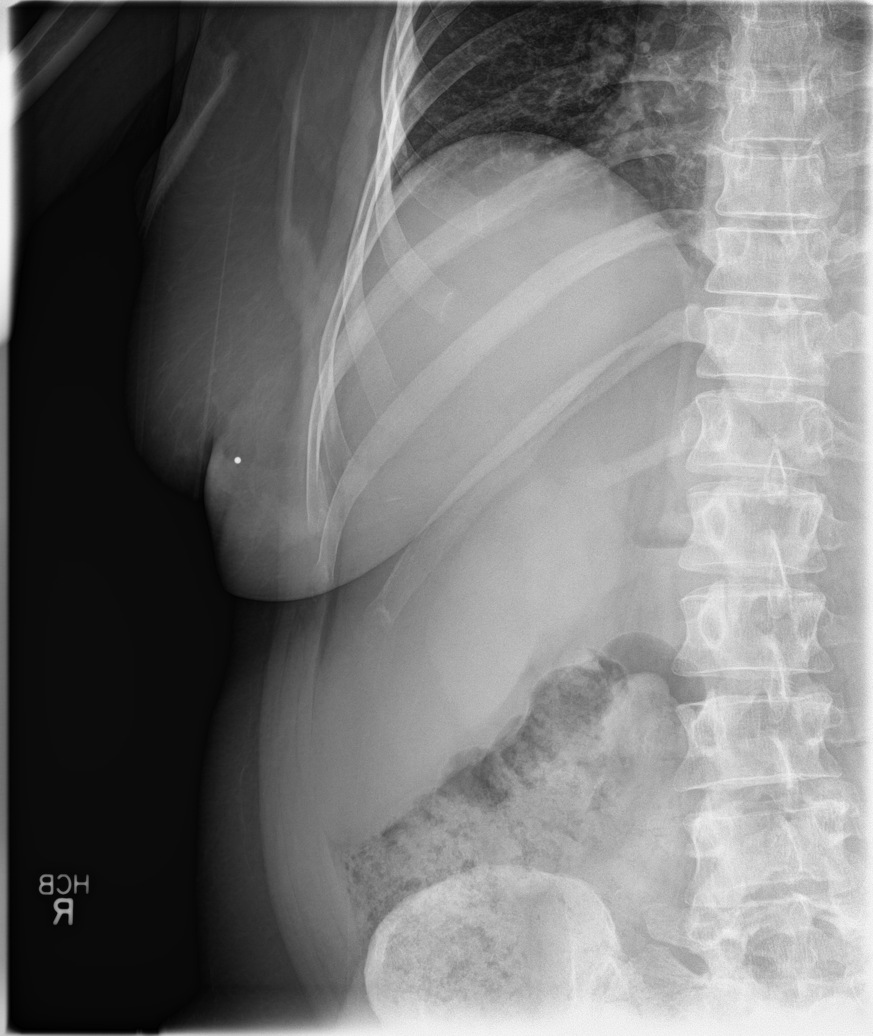

[rib obl (2 of 2)]
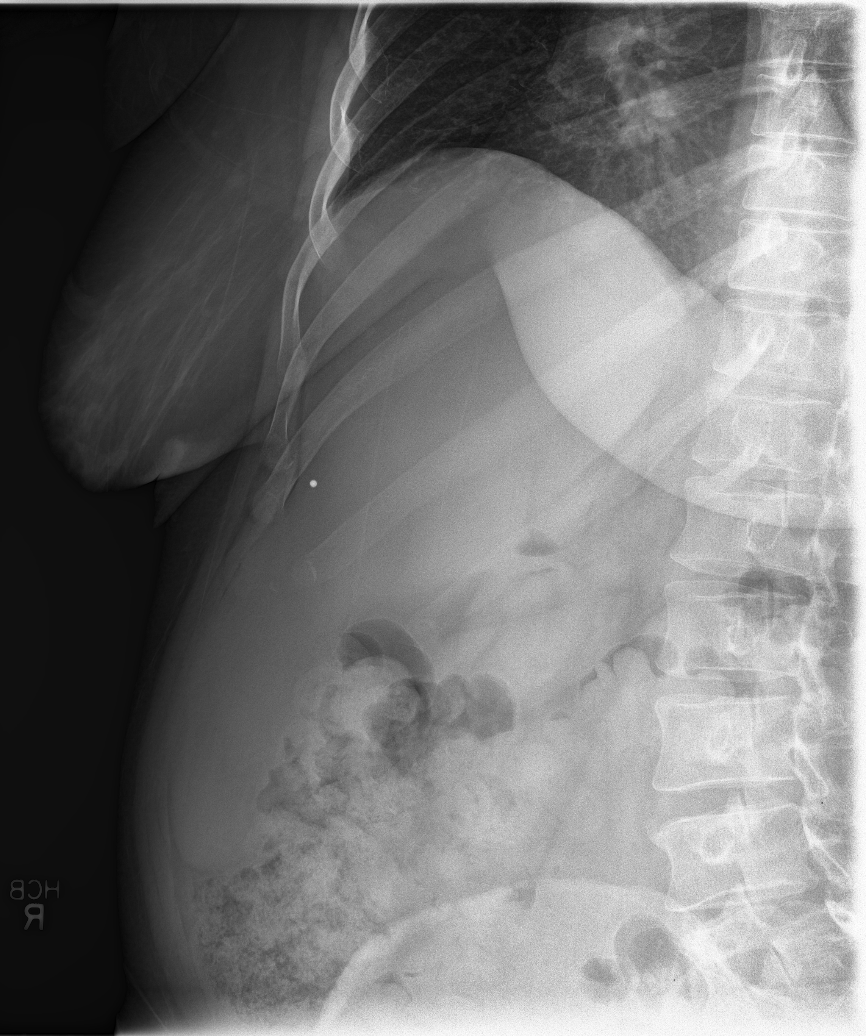

[5 of 5 positions shown; findings below may reference images not displayed]

FINDINGS: The lungs are adequately inflated. The interstitial markings are
minimally prominent but stable. The heart and pulmonary vascularity
are normal. There is no pleural effusion or pneumothorax. The
trachea is midline.

Right rib detail films reveal the ribs to be adequately mineralized.
No acute displaced fracture is demonstrated.
IMPRESSION: 1. There is no acute right rib fracture.
2. There is no acute cardiopulmonary abnormality.

## 2015-12-21 ENCOUNTER — Other Ambulatory Visit
Admission: RE | Admit: 2015-12-21 | Discharge: 2015-12-21 | Disposition: A | Discharge status: Home / Self Care | Payer: Medicare Other | Source: Ambulatory Visit | Attending: Dermatology | Admitting: Dermatology

## 2015-12-21 DIAGNOSIS — L659 Nonscarring hair loss, unspecified: Secondary | ICD-10-CM | POA: Diagnosis present

## 2015-12-21 DIAGNOSIS — H61031 Chondritis of right external ear: Secondary | ICD-10-CM | POA: Diagnosis present

## 2015-12-21 LAB — CBC WITH DIFFERENTIAL/PLATELET
BASOS PCT: 1 %
Basophils Absolute: 0 10*3/uL (ref 0–0.1)
EOS ABS: 0.1 10*3/uL (ref 0–0.7)
EOS PCT: 2 %
HCT: 40.7 % (ref 35.0–47.0)
Hemoglobin: 13.7 g/dL (ref 12.0–16.0)
Lymphocytes Relative: 38 %
Lymphs Abs: 1.9 10*3/uL (ref 1.0–3.6)
MCH: 29 pg (ref 26.0–34.0)
MCHC: 33.6 g/dL (ref 32.0–36.0)
MCV: 86.2 fL (ref 80.0–100.0)
Monocytes Absolute: 0.3 10*3/uL (ref 0.2–0.9)
Monocytes Relative: 6 %
NEUTROS PCT: 53 %
Neutro Abs: 2.6 10*3/uL (ref 1.4–6.5)
PLATELETS: 122 10*3/uL — AB (ref 150–440)
RBC: 4.72 MIL/uL (ref 3.80–5.20)
RDW: 14.1 % (ref 11.5–14.5)
WBC: 4.9 10*3/uL (ref 3.6–11.0)

## 2015-12-22 LAB — MISC LABCORP TEST (SEND OUT): Labcorp test code: 80325

## 2015-12-22 LAB — IRON AND TIBC
IRON: 57 ug/dL (ref 28–170)
SATURATION RATIOS: 19 % (ref 10.4–31.8)
TIBC: 300 ug/dL (ref 250–450)
UIBC: 243 ug/dL

## 2015-12-22 LAB — VITAMIN D PNL(25-HYDRXY+1,25-DIHY)-BLD
Vit D, 1,25-Dihydroxy: 40.5 pg/mL (ref 19.9–79.3)
Vit D, 25-Hydroxy: 15.8 ng/mL — ABNORMAL LOW (ref 30.0–100.0)

## 2015-12-22 LAB — FERRITIN: Ferritin: 189 ng/mL (ref 11–307)

## 2016-01-14 IMAGING — CR DG CHEST 2V
2 series · 2 of 2 positions shown · non-contrast
Comparison: Chest x-ray of 01/30/2014

CLINICAL DATA: Chest pain, shortness of breath for 1 day, fell 2
weeks ago with pain in the left chest

EXAM:
CHEST  2 VIEW

[chest pa]
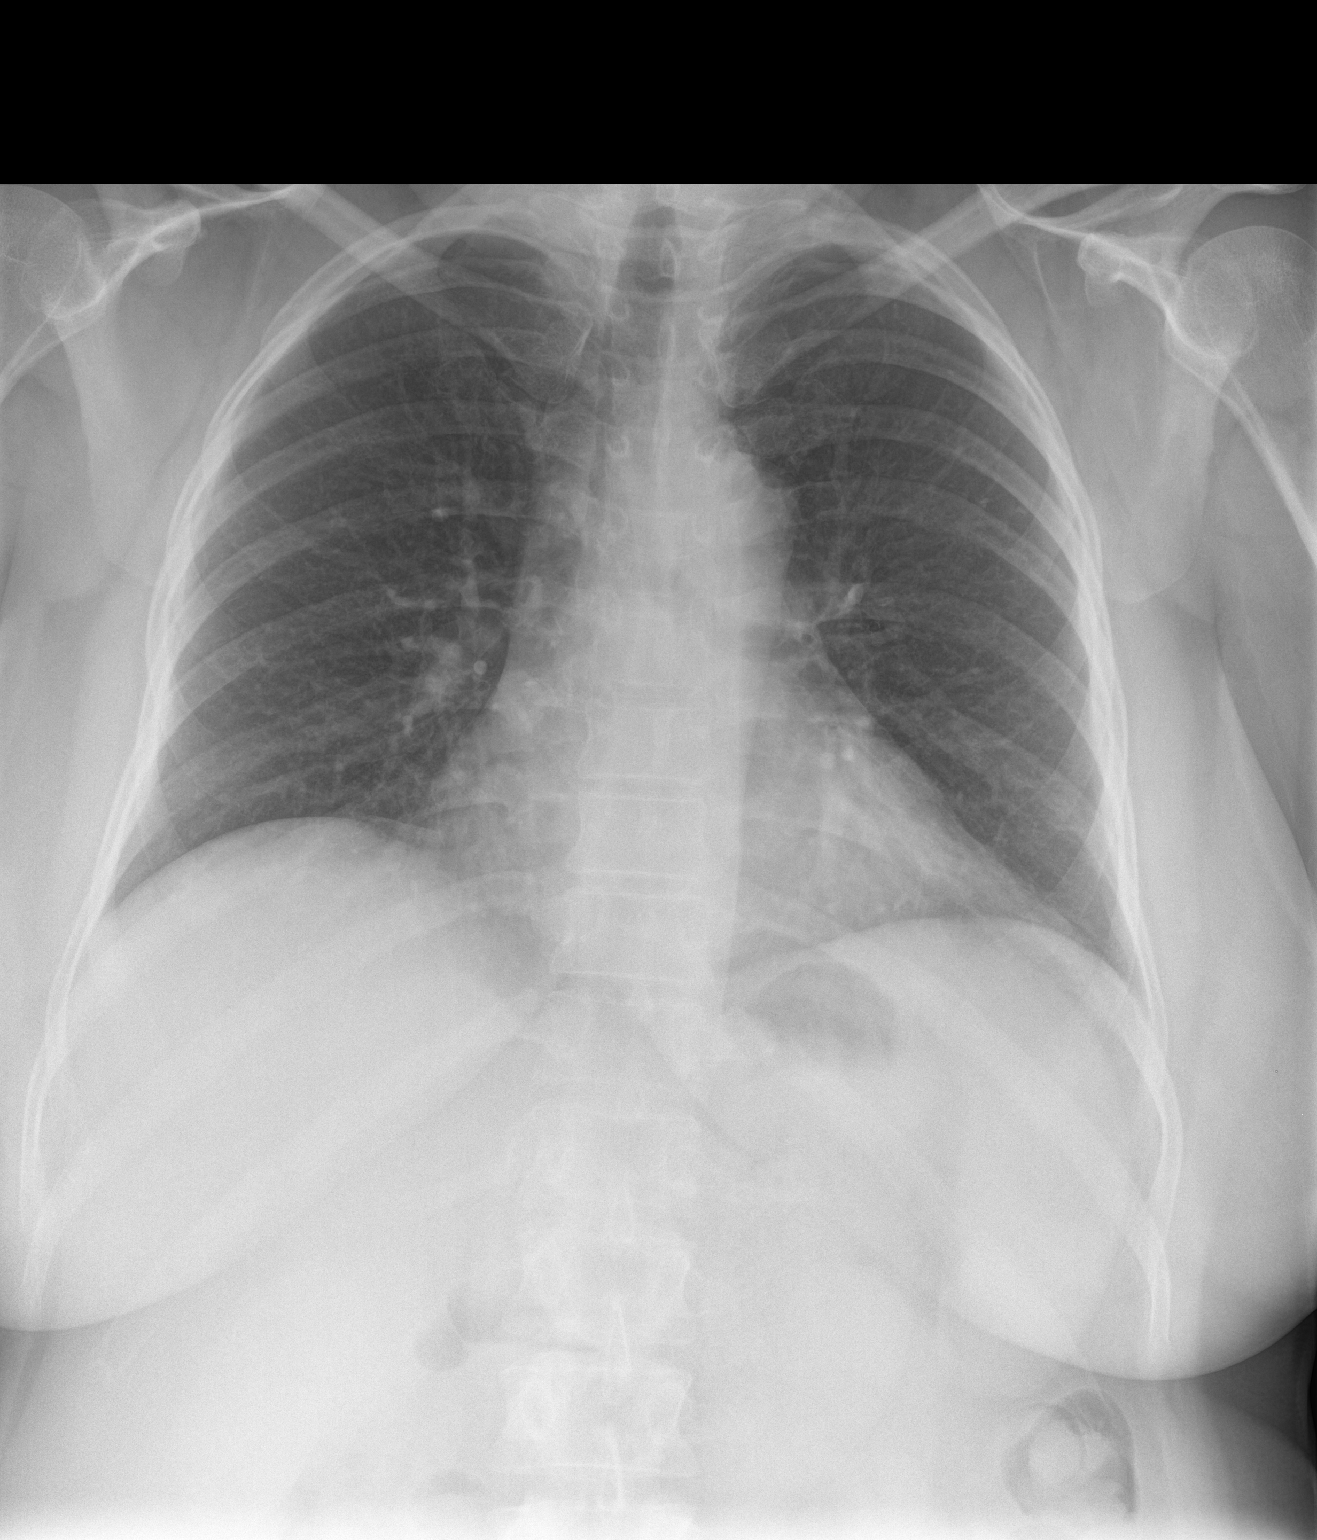

[chest lat]
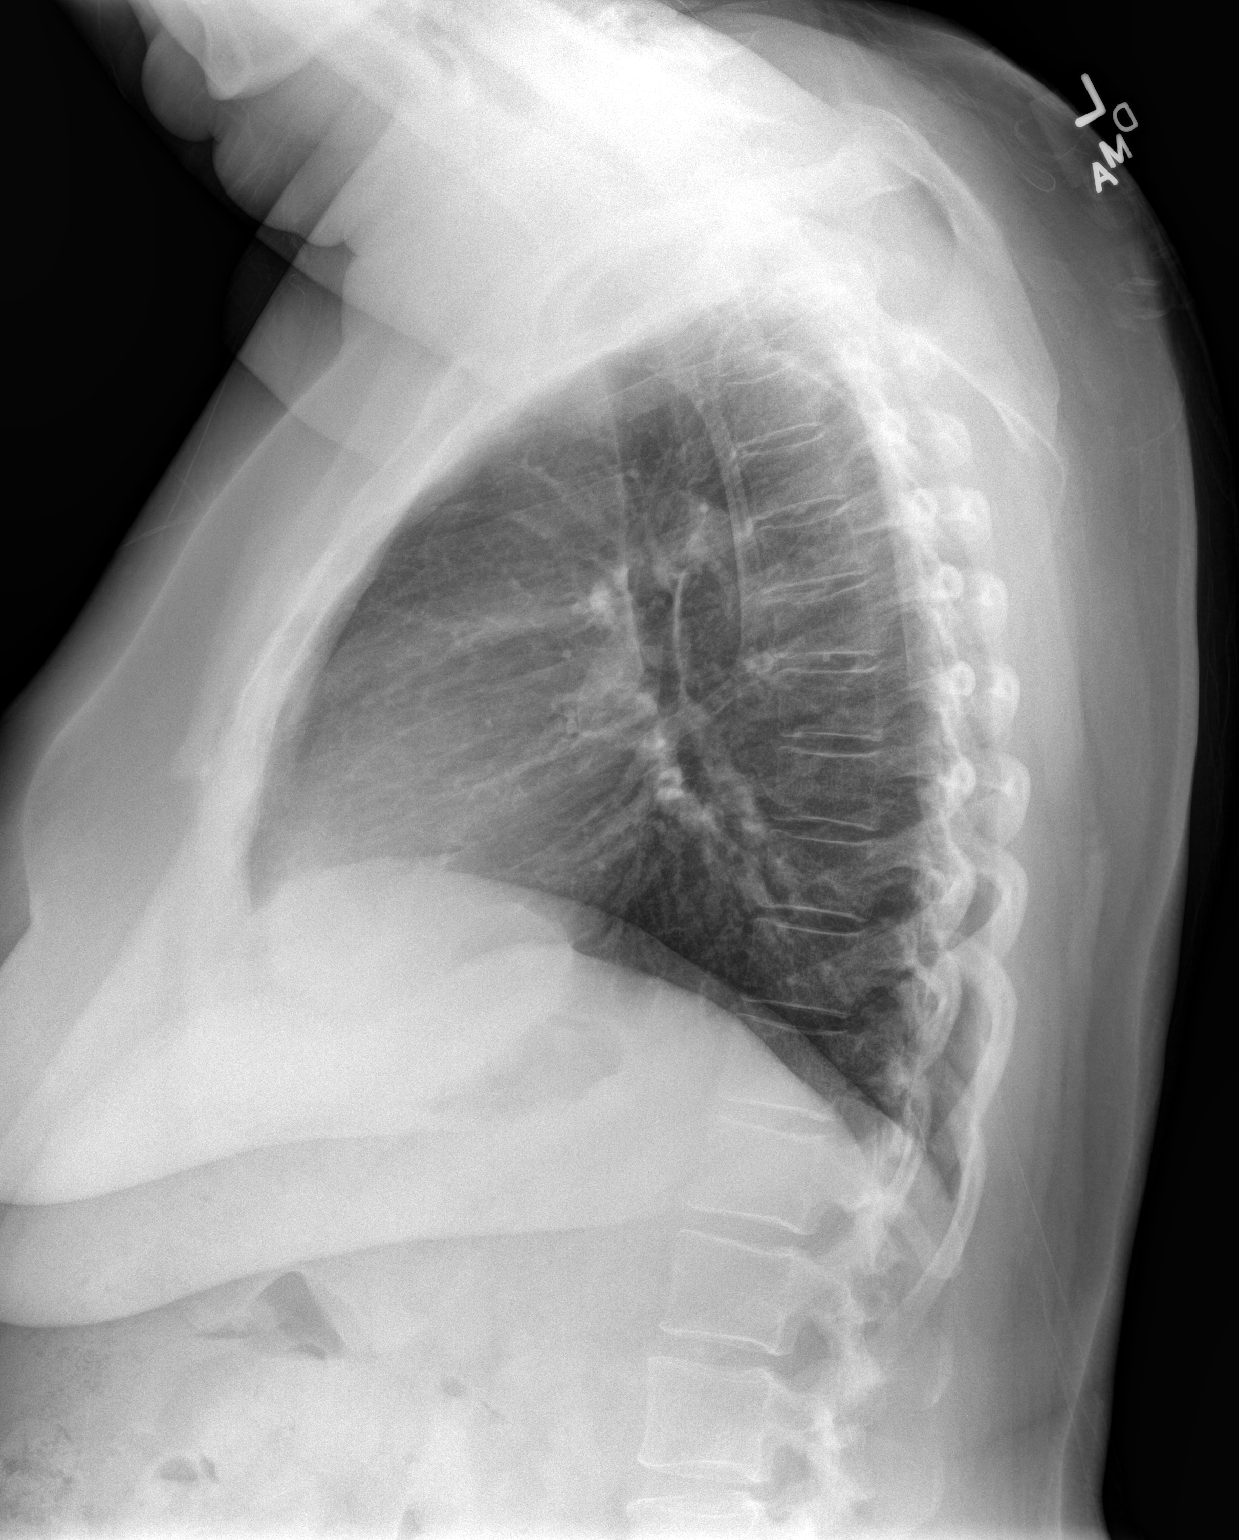

[2 of 2 positions shown; findings below may reference images not displayed]

FINDINGS: No active infiltrate or effusion is seen. No pneumothorax is noted.
Mediastinal and hilar contours are unremarkable. The heart is within
normal limits in size. No definite acute rib fracture is evident by
chest x-ray.
IMPRESSION: 1. No active cardiopulmonary disease.
2. No acute fracture is seen.

## 2016-02-14 ENCOUNTER — Other Ambulatory Visit: Payer: Medicare Other

## 2016-02-14 ENCOUNTER — Ambulatory Visit: Payer: Medicare Other

## 2016-02-29 ENCOUNTER — Ambulatory Visit
Admission: RE | Admit: 2016-02-29 | Discharge: 2016-02-29 | Disposition: A | Discharge status: Home / Self Care | Payer: Medicare Other | Source: Ambulatory Visit | Attending: Family | Admitting: Family

## 2016-02-29 DIAGNOSIS — N63 Unspecified lump in unspecified breast: Secondary | ICD-10-CM | POA: Diagnosis present

## 2016-02-29 DIAGNOSIS — N6320 Unspecified lump in the left breast, unspecified quadrant: Secondary | ICD-10-CM | POA: Insufficient documentation

## 2016-02-29 HISTORY — DX: Malignant neoplasm of unspecified site of unspecified female breast: C50.919

## 2016-03-02 ENCOUNTER — Other Ambulatory Visit: Payer: Self-pay | Admitting: Family

## 2016-03-02 DIAGNOSIS — N6320 Unspecified lump in the left breast, unspecified quadrant: Secondary | ICD-10-CM

## 2016-03-28 ENCOUNTER — Ambulatory Visit: Payer: Medicare Other

## 2016-04-09 ENCOUNTER — Ambulatory Visit
Admission: RE | Admit: 2016-04-09 | Discharge: 2016-04-09 | Disposition: A | Discharge status: Home / Self Care | Payer: Medicare Other | Source: Ambulatory Visit | Attending: Family | Admitting: Family

## 2016-04-09 DIAGNOSIS — N6320 Unspecified lump in the left breast, unspecified quadrant: Secondary | ICD-10-CM

## 2016-04-10 LAB — SURGICAL PATHOLOGY

## 2016-07-25 IMAGING — CR DG FOOT COMPLETE 3+V*R*
3 series · 3 of 3 positions shown · non-contrast
Comparison: November 23, 2007

CLINICAL DATA: Pain and bruising laterally after kicking water
coolor 1 day prior

EXAM:
RIGHT FOOT COMPLETE - 3+ VIEW

[foot ap]
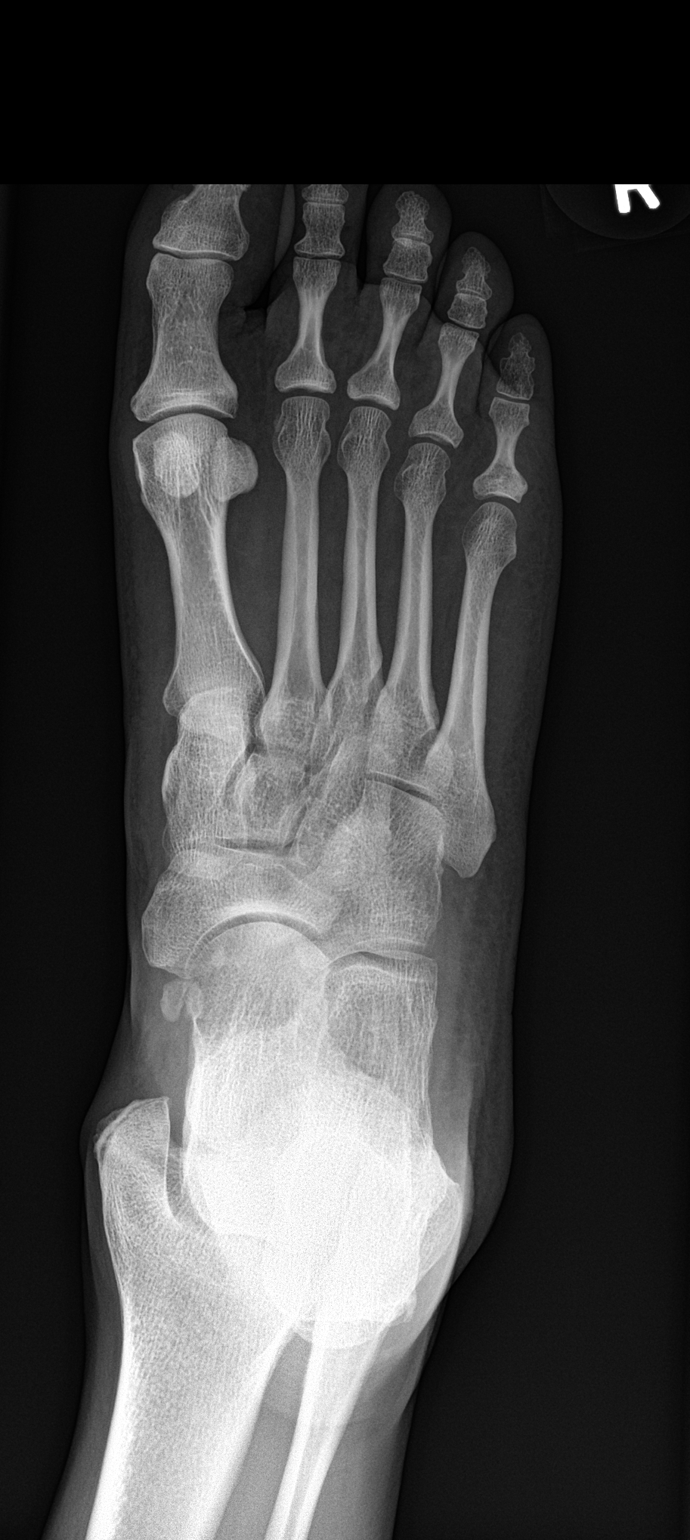

[foot obl]
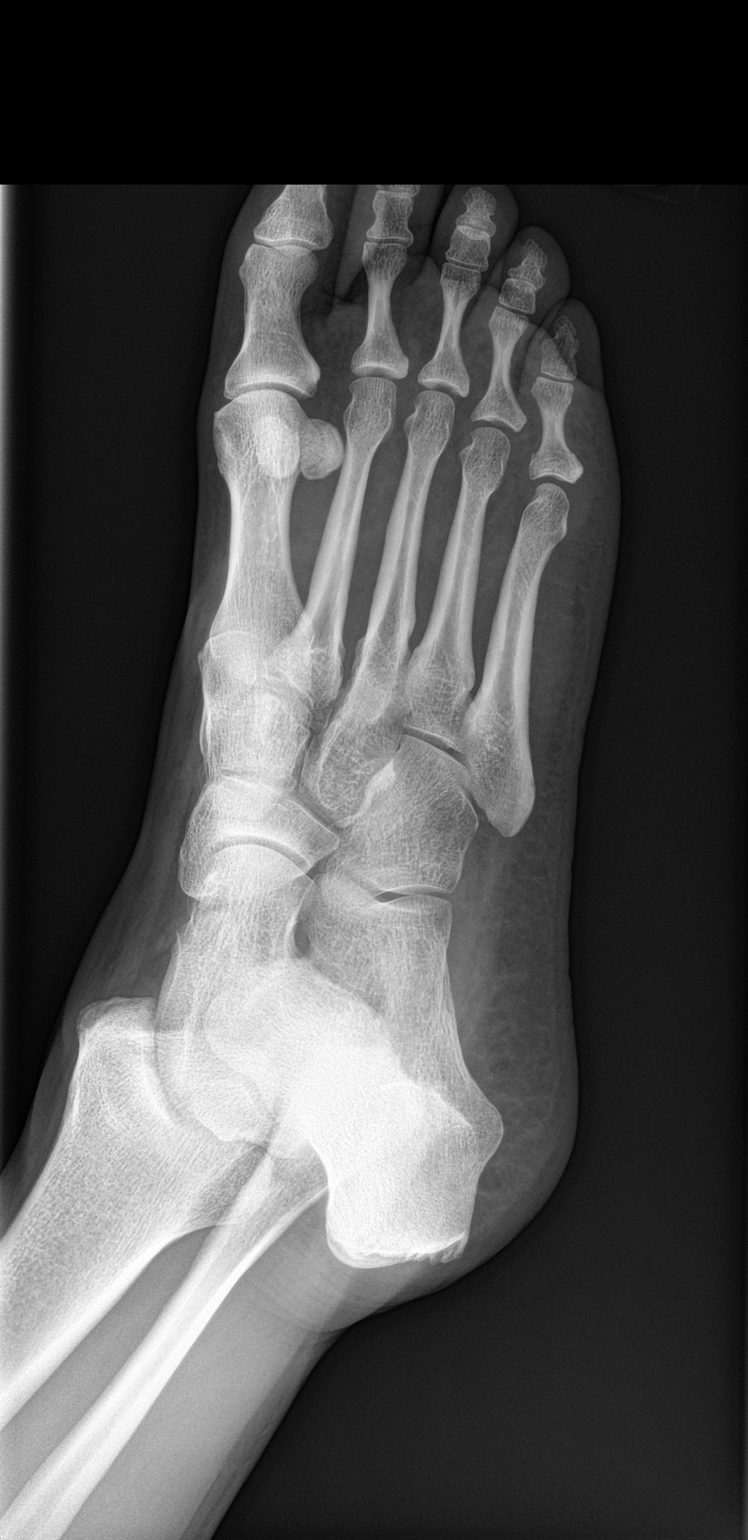

[foot lat]
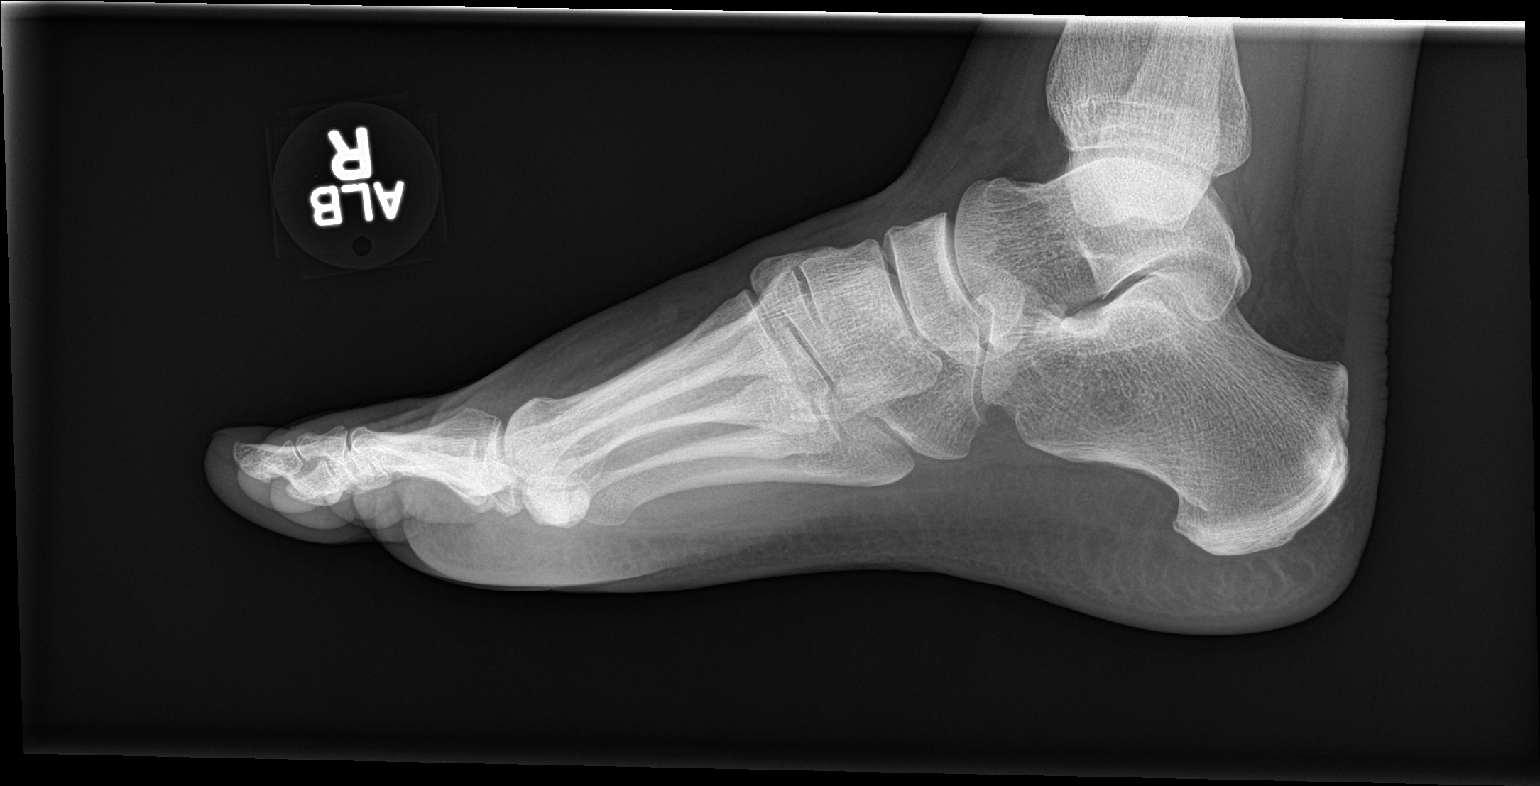

[3 of 3 positions shown; findings below may reference images not displayed]

FINDINGS: Frontal, oblique, and lateral views were obtained. There is minimal
avulsion along the medial proximal aspect of the fifth proximal
phalanx. No other evidence of fracture. No dislocation. Joint spaces
appear intact. No erosive change.
IMPRESSION: Tiny avulsion along the medial proximal aspect of the fifth proximal
phalanx. No other evidence of fracture. No dislocation. No
appreciable arthropathy.

## 2016-10-10 ENCOUNTER — Encounter: Payer: Self-pay | Admitting: Emergency Medicine

## 2016-10-10 ENCOUNTER — Ambulatory Visit
Admission: EM | Admit: 2016-10-10 | Discharge: 2016-10-10 | Disposition: A | Discharge status: Home / Self Care | Payer: Medicare Other | Attending: Family Medicine | Admitting: Family Medicine

## 2016-10-10 ENCOUNTER — Ambulatory Visit (INDEPENDENT_AMBULATORY_CARE_PROVIDER_SITE_OTHER): Payer: Medicare Other

## 2016-10-10 DIAGNOSIS — S60212A Contusion of left wrist, initial encounter: Secondary | ICD-10-CM | POA: Diagnosis not present

## 2016-10-10 DIAGNOSIS — R300 Dysuria: Secondary | ICD-10-CM

## 2016-10-10 DIAGNOSIS — M545 Low back pain, unspecified: Secondary | ICD-10-CM

## 2016-10-10 DIAGNOSIS — S63502A Unspecified sprain of left wrist, initial encounter: Secondary | ICD-10-CM

## 2016-10-10 LAB — URINALYSIS, COMPLETE (UACMP) WITH MICROSCOPIC
BILIRUBIN URINE: NEGATIVE
Glucose, UA: 500 mg/dL — AB
Hgb urine dipstick: NEGATIVE
Ketones, ur: NEGATIVE mg/dL
LEUKOCYTES UA: NEGATIVE
NITRITE: NEGATIVE
Protein, ur: NEGATIVE mg/dL
RBC / HPF: NONE SEEN RBC/hpf (ref 0–5)
Specific Gravity, Urine: >1.03 — ABNORMAL HIGH (ref 1.005–1.030)
pH: 5 (ref 5.0–8.0)

## 2016-10-10 MED ORDER — CIPROFLOXACIN HCL 250 MG PO TABS: 250.0000 mg | ORAL_TABLET | Freq: Two times a day (BID) | ORAL | 0 refills | Status: DC

## 2016-10-10 NOTE — Discharge Instructions (Signed)
Continue home pain medication Rest, ice, elevate, NSAIDs prn

## 2016-10-10 NOTE — ED Triage Notes (Signed)
Patient states that she she slipped and got herself with her left hand.  Patient c/o pain in her left and wrist.

## 2016-10-10 NOTE — ED Provider Notes (Signed)
[facility]    CSN: [id number] Arrival date & time: 10/10/16  1016     History   Chief Complaint Chief Complaint  Patient presents with  . Wrist Pain  . Back Pain    HPI Terri Woods is a 46 y.o. female.   The history is provided by the patient.  Wrist Pain  This is a new problem. The current episode started yesterday (states she fell in her bathroom yesterday as she was sitting on the toilet; landed on her outstretched hand, now with c/o pain). The problem occurs constantly. The problem has not changed since onset.Pertinent negatives include no abdominal pain.  Back Pain  Associated symptoms: dysuria   Associated symptoms: no abdominal pain and no fever   Dysuria  Pain quality:  Aching Pain severity:  Mild Onset quality:  Sudden Duration:  2 days Timing:  Constant Progression:  Unchanged Chronicity:  New Recent urinary tract infections: no   Relieved by:  None tried Ineffective treatments:  None tried Urinary symptoms: discolored urine   Urinary symptoms: no foul-smelling urine, no frequent urination, no hematuria, no hesitancy and no bladder incontinence   Associated symptoms: flank pain   Associated symptoms: no abdominal pain, no fever, no genital lesions, no nausea, no vaginal discharge and no vomiting   Risk factors: hx of urolithiasis   Risk factors: no hx of pyelonephritis, no kidney transplant, not pregnant, no recurrent urinary tract infections, no renal cysts, no renal disease, not sexually active, no sexually transmitted infections, no single kidney and no urinary catheter     Past Medical History:  Diagnosis Date  . Back pain   . Breast cancer ([place]) 2001   RT LUMPECTOMY PER PT  . Crohn's disease ([place])   . Diabetes ([place])   . Fibromyalgia     There are no active problems to display for this patient.   Past Surgical History:  Procedure Laterality Date  . BREAST EXCISIONAL BIOPSY Right 15+ yrs ago   NEG  . BREAST SURGERY    . FOOT  SURGERY Left   . SHOULDER SURGERY    . SMALL INTESTINE SURGERY      OB History    No data available       Home Medications    Prior to Admission medications   Medication Sig Start Date End Date Taking? Authorizing Provider  ciprofloxacin (CIPRO) 250 MG tablet Take 1 tablet (250 mg total) by mouth every 12 (twelve) hours. 10/10/16   Norval Gable, MD  clonazePAM (KLONOPIN) 1 MG tablet Take 1 mg by mouth 2 (two) times daily.    [provider]  EPINEPHrine 0.3 mg/0.3 mL IJ SOAJ injection Inject 0.3 mg into the muscle once.    [provider]  topiramate (TOPAMAX) 100 MG tablet Take 100 mg by mouth 2 (two) times daily.    [provider]    Family History Family History  Problem Relation Age of Onset  . Heart attack Sister 23  . Heart disease Father   . Breast cancer Mother   . Breast cancer Maternal Aunt     Social History Social History  Substance Use Topics  . Smoking status: Never Smoker  . Smokeless tobacco: Never Used  . Alcohol use No     Allergies   Bee venom; Doxycycline; Erythromycin; and Shellfish allergy   Review of Systems Review of Systems  Constitutional: Negative for fever.  Gastrointestinal: Negative for abdominal pain, nausea and vomiting.  Genitourinary: Positive for dysuria  and flank pain. Negative for vaginal discharge.  Musculoskeletal: Positive for back pain.     Physical Exam Triage Vital Signs ED Triage Vitals  Enc Vitals Group     BP 10/10/16 1048 (!) 144/86     Pulse Rate 10/10/16 1048 87     Resp 10/10/16 1048 16     Temp 10/10/16 1048 98.1 F (36.7 C)     Temp Source 10/10/16 1048 Oral     SpO2 10/10/16 1048 97 %     Weight 10/10/16 1046 155 lb (70.3 kg)     Height 10/10/16 1046 5\' 1"  (1.549 m)     Head Circumference --      Peak Flow --      Pain Score 10/10/16 1046 10     Pain Loc --      Pain Edu? --      Excl. in [place]? --    No data found.   Updated Vital Signs BP (!) 144/86 (BP  Location: Right Arm)   Pulse 87   Temp 98.1 F (36.7 C) (Oral)   Resp 16   Ht 5\' 1"  (1.549 m)   Wt 155 lb (70.3 kg)   LMP 09/19/2012 (LMP Unknown) Comment: denies preg, perimenopausal  SpO2 97%   BMI 29.29 kg/m   Visual Acuity Right Eye Distance:   Left Eye Distance:   Bilateral Distance:    Right Eye Near:   Left Eye Near:    Bilateral Near:     Physical Exam   UC Treatments / Results  Labs (all labs ordered are listed, but only abnormal results are displayed) Labs Reviewed  URINALYSIS, COMPLETE (UACMP) WITH MICROSCOPIC - Abnormal; Notable for the following:       Result Value   APPearance HAZY (*)    Specific Gravity, Urine >1.030 (*)    Glucose, UA 500 (*)    Squamous Epithelial / LPF TOO NUMEROUS TO COUNT (*)    Bacteria, UA FEW (*)    All other components within normal limits  URINE CULTURE    EKG  EKG Interpretation None       Radiology Dg Wrist Complete Left  Result Date: 10/10/2016 CLINICAL DATA:  Pain following fall EXAM: LEFT WRIST - COMPLETE 3+ VIEW COMPARISON:  December 16, 2012 FINDINGS: Frontal, oblique, lateral, and ulnar deviation scaphoid images were obtained. There is no fracture or dislocation. The joint spaces appear normal. No erosive change. The pronator quadratus fat pad is not elevated. IMPRESSION: No fracture or dislocation.  No apparent arthropathy. Electronically Signed   By: Lowella Grip III M.D.   On: 10/10/2016 11:26   Dg Hand Complete Left  Result Date: 10/10/2016 CLINICAL DATA:  Pain following fall EXAM: LEFT HAND - COMPLETE 3+ VIEW COMPARISON:  November 27, 2004 FINDINGS: Frontal, oblique, and lateral views were obtained. There is no fracture or dislocation. Joint spaces appear normal. No erosive change. IMPRESSION: No fracture or dislocation.  No apparent arthropathy. Electronically Signed   By: Lowella Grip III M.D.   On: 10/10/2016 11:25    Procedures Procedures (including critical care time)  Medications Ordered in  UC Medications - No data to display   Initial Impression / Assessment and Plan / UC Course  I have reviewed the triage vital signs and the nursing notes.  Pertinent labs & imaging results that were available during my care of the patient were reviewed by me and considered in my medical decision making (see chart for details).  Final Clinical Impressions(s) / UC Diagnoses   Final diagnoses:  Acute low back pain without sciatica, unspecified back pain laterality  Contusion of left wrist, initial encounter  Sprain of left wrist, initial encounter  Dysuria    New Prescriptions Discharge Medication List as of 10/10/2016 12:00 PM    START taking these medications   Details  ciprofloxacin (CIPRO) 250 MG tablet Take 1 tablet (250 mg total) by mouth every 12 (twelve) hours., Starting Wed 10/10/2016, Normal       1. x-ray results and diagnosis reviewed with patient 2. rx as per orders above; reviewed possible side effects, interactions, risks and benefits  3. Recommend supportive treatment with increased water intake; rest, ice, elevation of left upper extremity 4. Follow-up prn if symptoms worsen or don't improve   Norval Gable, MD 10/10/16 1420

## 2016-10-11 LAB — URINE CULTURE: CULTURE: NO GROWTH

## 2017-06-21 IMAGING — CR DG HAND COMPLETE 3+V*L*
3 series · 3 of 3 positions shown · non-contrast
Comparison: November 27, 2004

CLINICAL DATA: Pain following fall

EXAM:
LEFT HAND - COMPLETE 3+ VIEW

[hand ap]
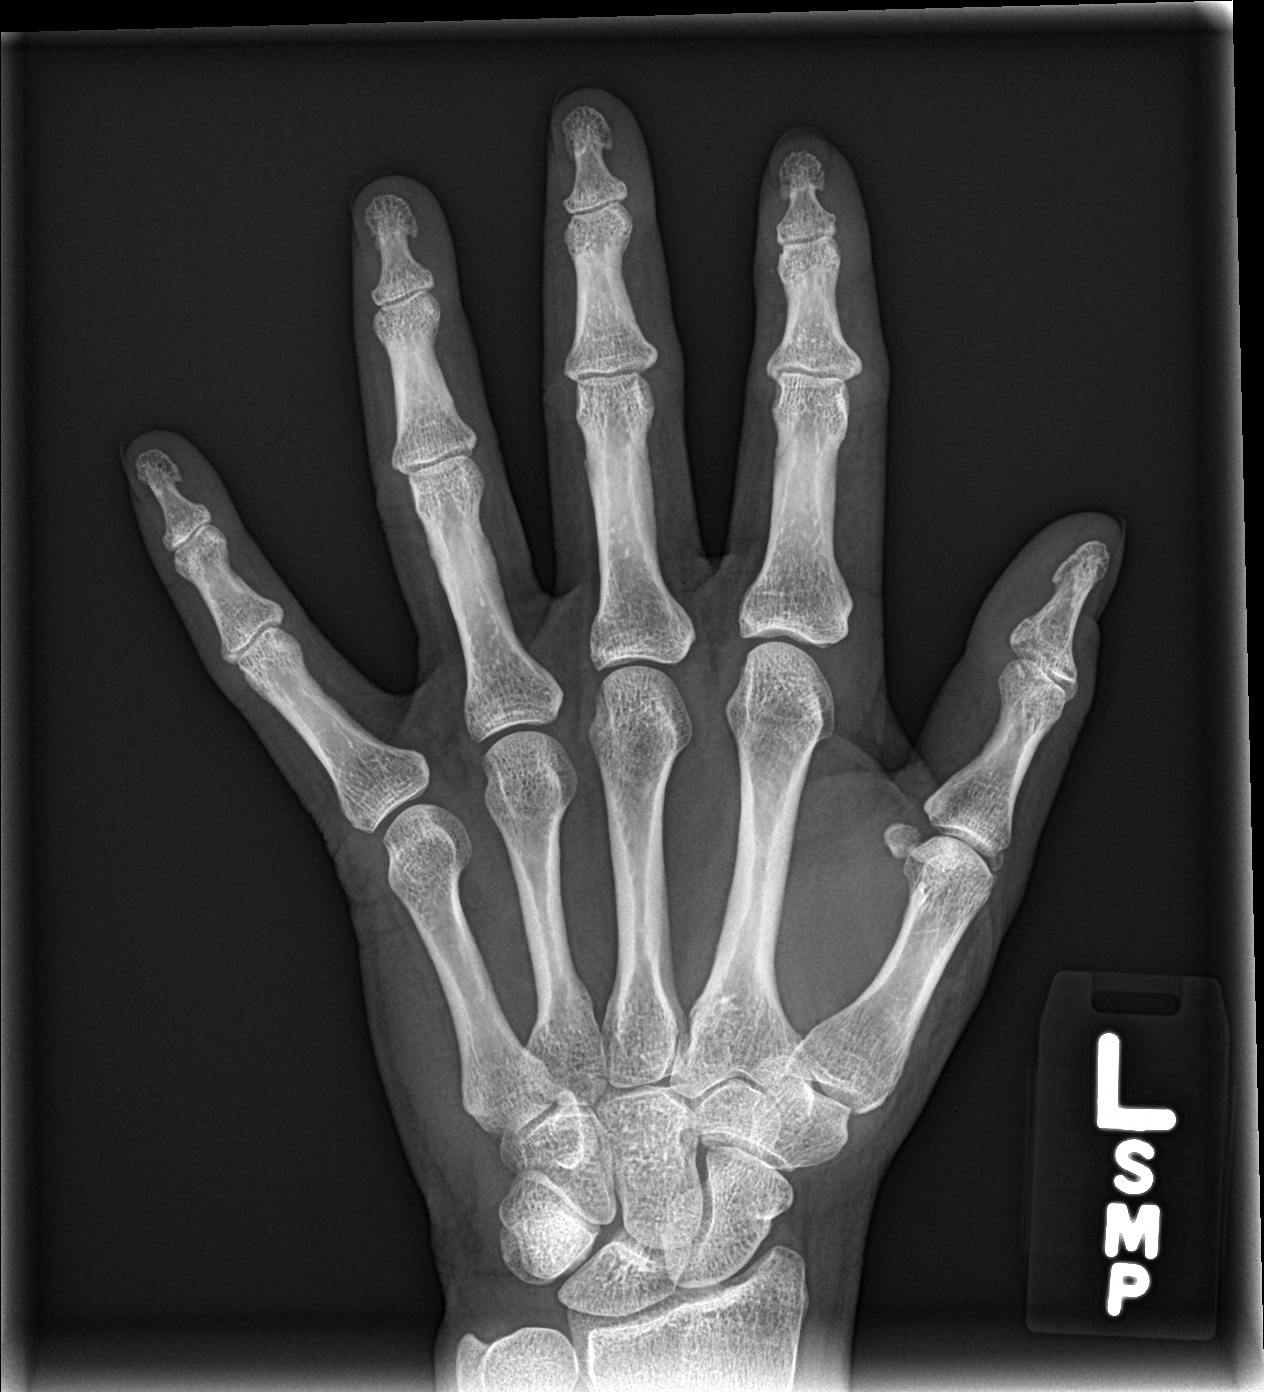

[hand obl]
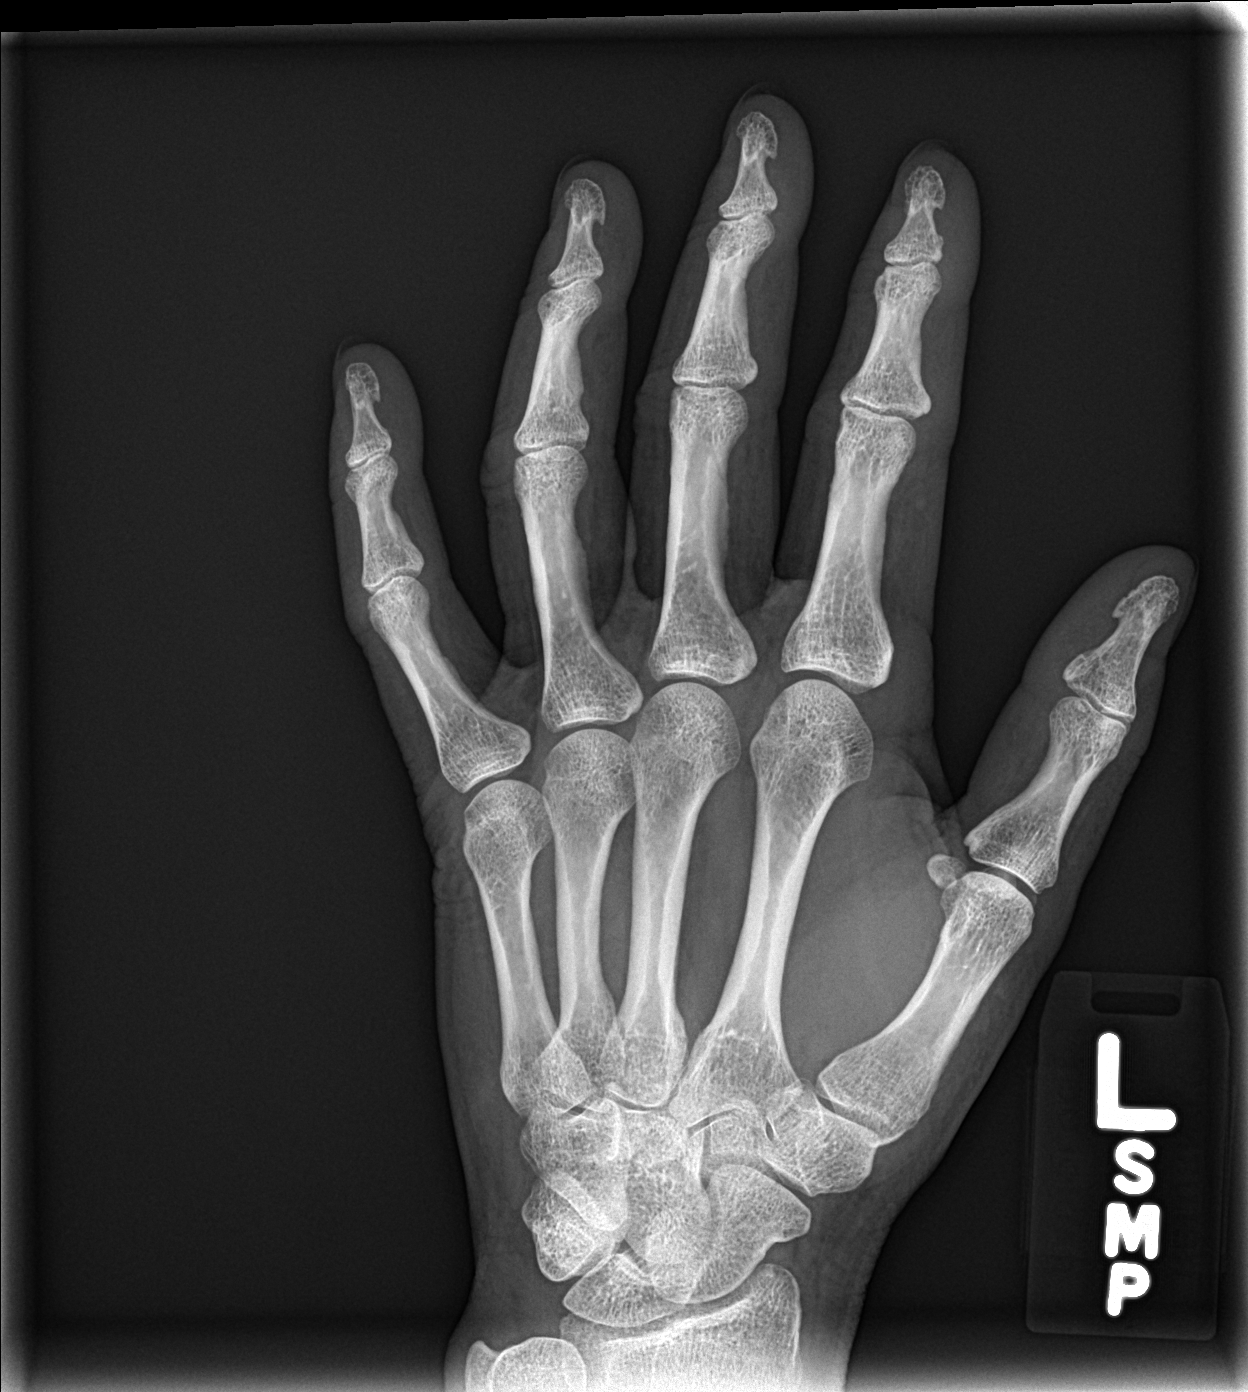

[hand lat]
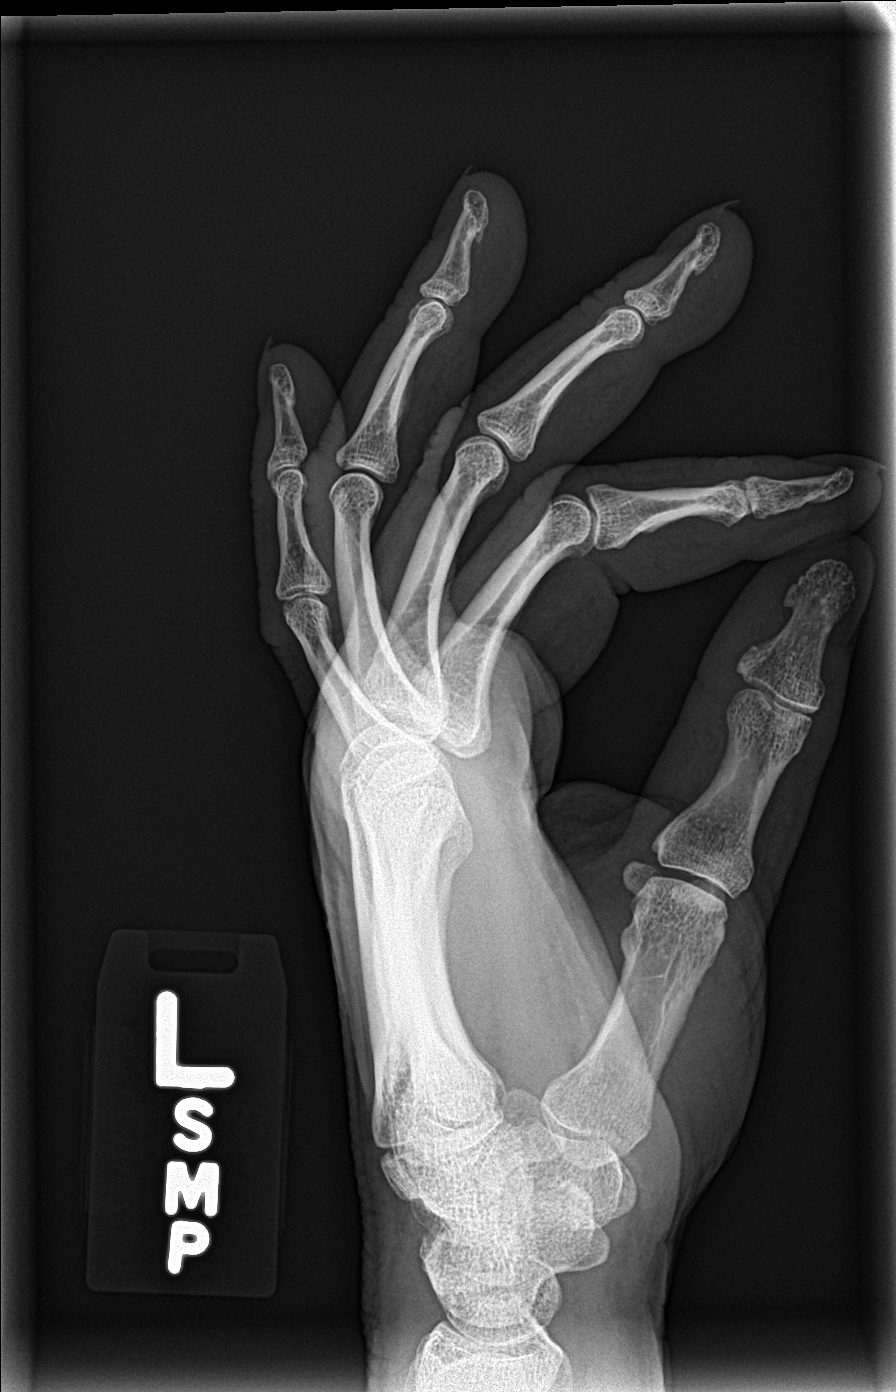

[3 of 3 positions shown; findings below may reference images not displayed]

FINDINGS: Frontal, oblique, and lateral views were obtained. There is no
fracture or dislocation. Joint spaces appear normal. No erosive
change.
IMPRESSION: No fracture or dislocation.  No apparent arthropathy.

## 2017-06-21 IMAGING — CR DG WRIST COMPLETE 3+V*L*
4 series · 4 of 4 positions shown · non-contrast
Comparison: December 16, 2012

CLINICAL DATA: Pain following fall

EXAM:
LEFT WRIST - COMPLETE 3+ VIEW

[wrist pa]
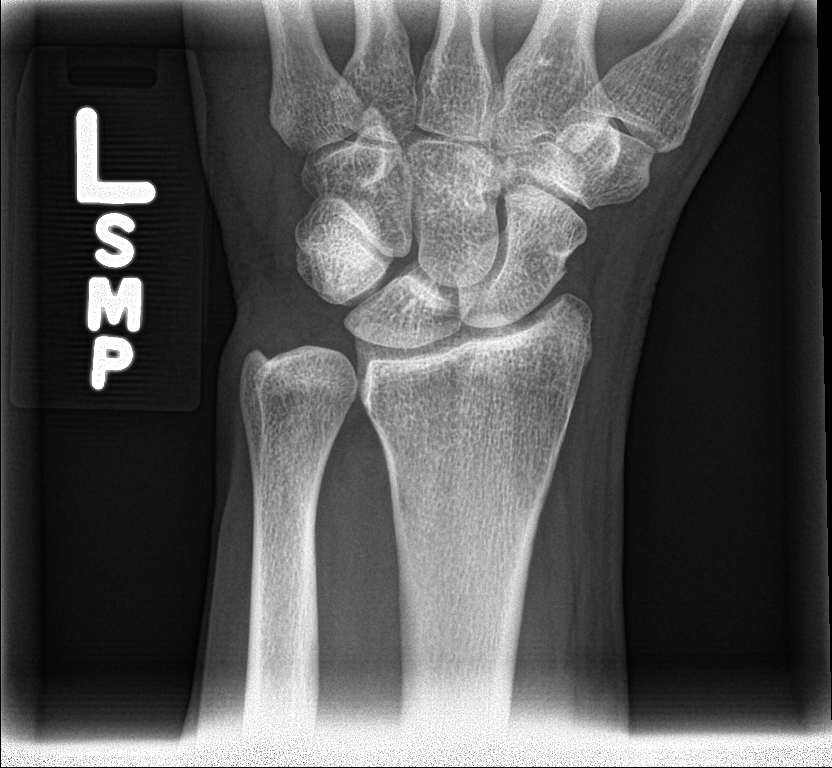

[wrist obl]
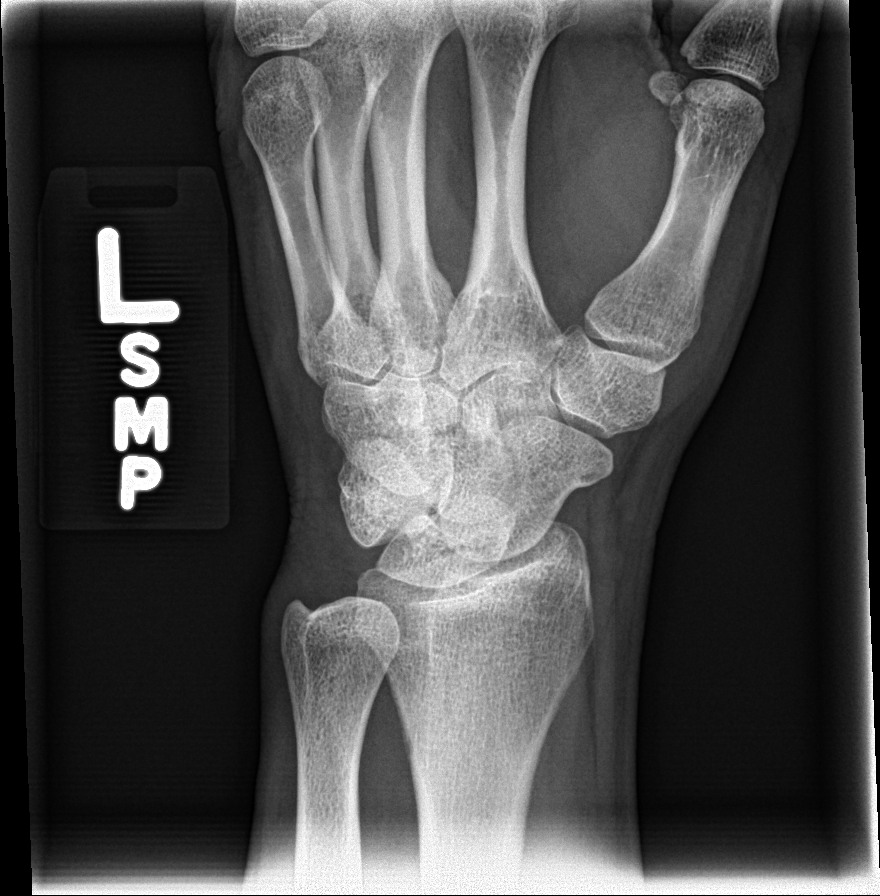

[wrist lat]
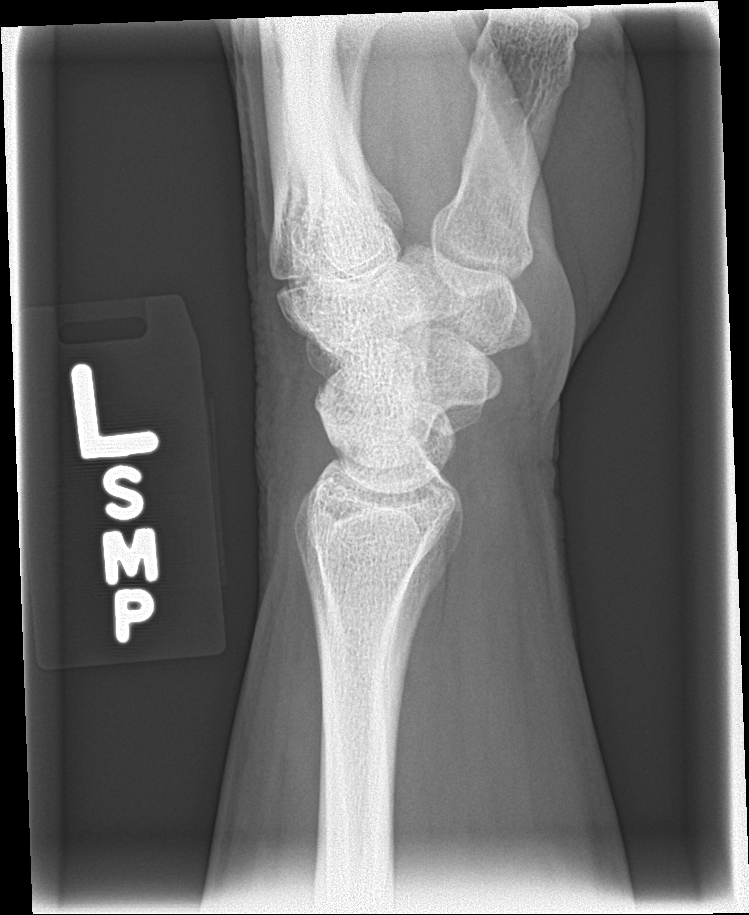

[wrist navicular]
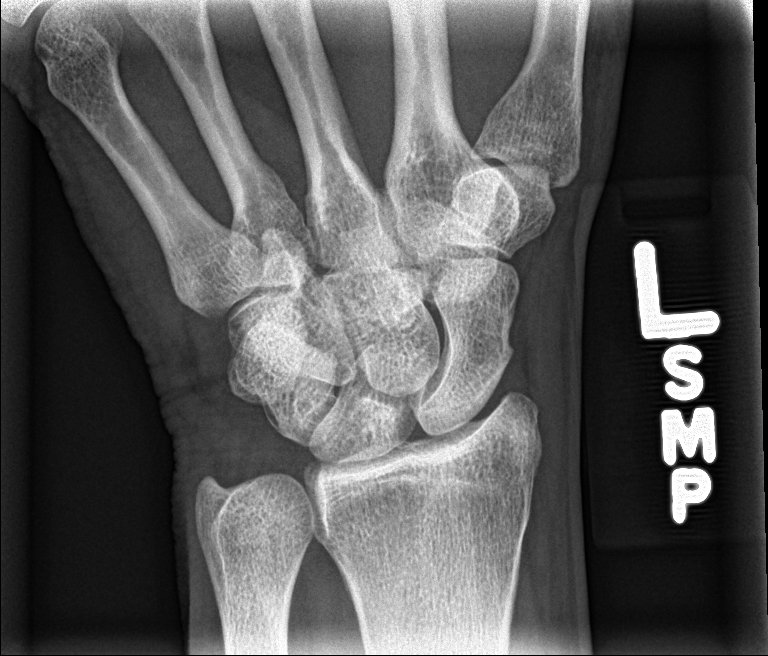

[4 of 4 positions shown; findings below may reference images not displayed]

FINDINGS: Frontal, oblique, lateral, and ulnar deviation scaphoid images were
obtained. There is no fracture or dislocation. The joint spaces
appear normal. No erosive change. The pronator quadratus fat pad is
not elevated.
IMPRESSION: No fracture or dislocation.  No apparent arthropathy.

## 2017-07-26 ENCOUNTER — Other Ambulatory Visit: Payer: Self-pay | Admitting: Orthopedic Surgery

## 2017-07-26 DIAGNOSIS — M5412 Radiculopathy, cervical region: Secondary | ICD-10-CM

## 2017-07-29 ENCOUNTER — Ambulatory Visit
Admission: RE | Admit: 2017-07-29 | Discharge: 2017-07-29 | Disposition: A | Discharge status: Home / Self Care | Payer: Medicare Other | Source: Ambulatory Visit | Attending: Orthopedic Surgery | Admitting: Orthopedic Surgery

## 2017-07-29 DIAGNOSIS — M5412 Radiculopathy, cervical region: Secondary | ICD-10-CM | POA: Diagnosis present

## 2017-11-27 ENCOUNTER — Ambulatory Visit
Admission: RE | Admit: 2017-11-27 | Discharge: 2017-11-27 | Disposition: A | Discharge status: Home / Self Care | Payer: Medicare Other | Source: Ambulatory Visit | Attending: Family | Admitting: Family

## 2017-11-27 ENCOUNTER — Other Ambulatory Visit: Payer: Self-pay | Admitting: Family

## 2017-11-27 DIAGNOSIS — K5903 Drug induced constipation: Secondary | ICD-10-CM | POA: Diagnosis not present

## 2017-11-27 DIAGNOSIS — R1032 Left lower quadrant pain: Secondary | ICD-10-CM

## 2017-11-27 DIAGNOSIS — T50905A Adverse effect of unspecified drugs, medicaments and biological substances, initial encounter: Secondary | ICD-10-CM | POA: Insufficient documentation

## 2018-01-03 IMAGING — MG MM DIGITAL DIAGNOSTIC BILAT W/ TOMO W/ CAD
8 of 18 series · 8 of 40 positions shown · non-contrast
Comparison: Previous exam(s).

CLINICAL DATA: 45-year-old female with history of right lumpectomy
approximately 15 years ago. The patient states that she did not have
radiation or chemotherapy because she could not tolerate it. The
patient also reports a history of head injury causing her to have
poor memory. The patient notes lumps in the inferior breasts
bilaterally.

EXAM:
2D DIGITAL DIAGNOSTIC BILATERAL MAMMOGRAM WITH CAD AND ADJUNCT TOMO
ULTRASOUND BILATERAL BREAST

[L MLO synth-2D]
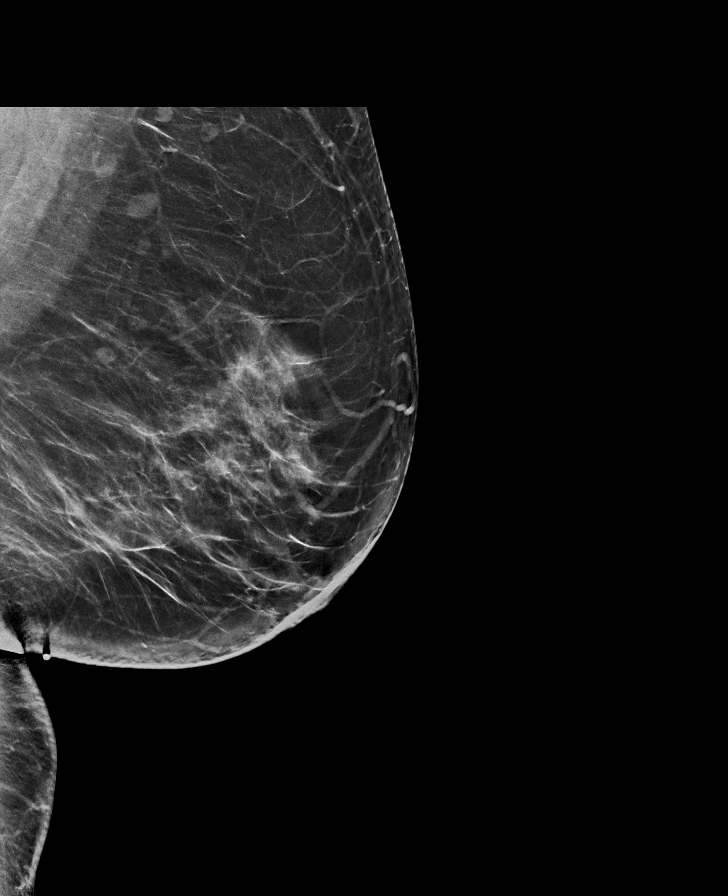

[L TAN synth-2D]
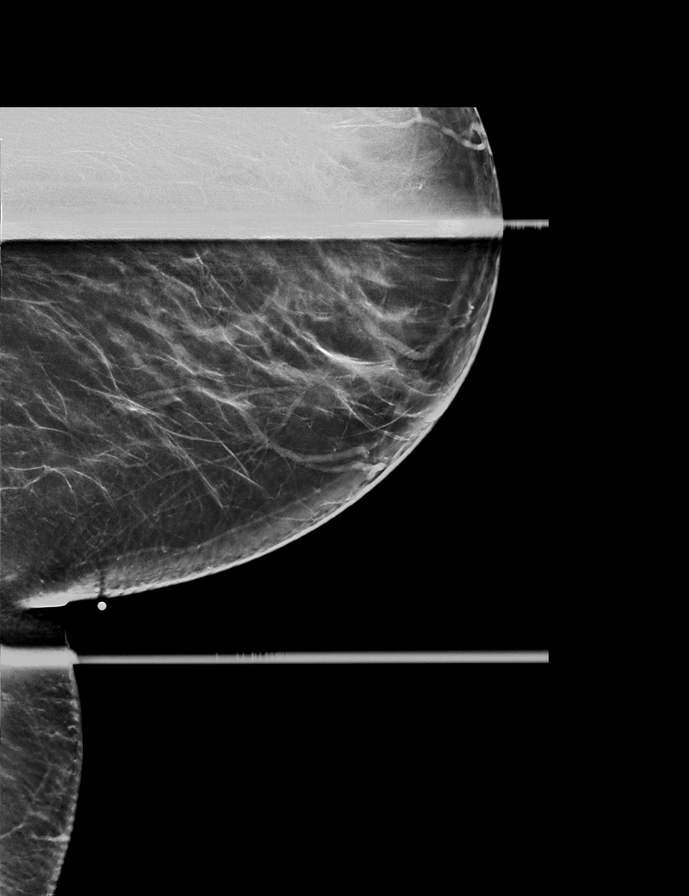

[L CC]
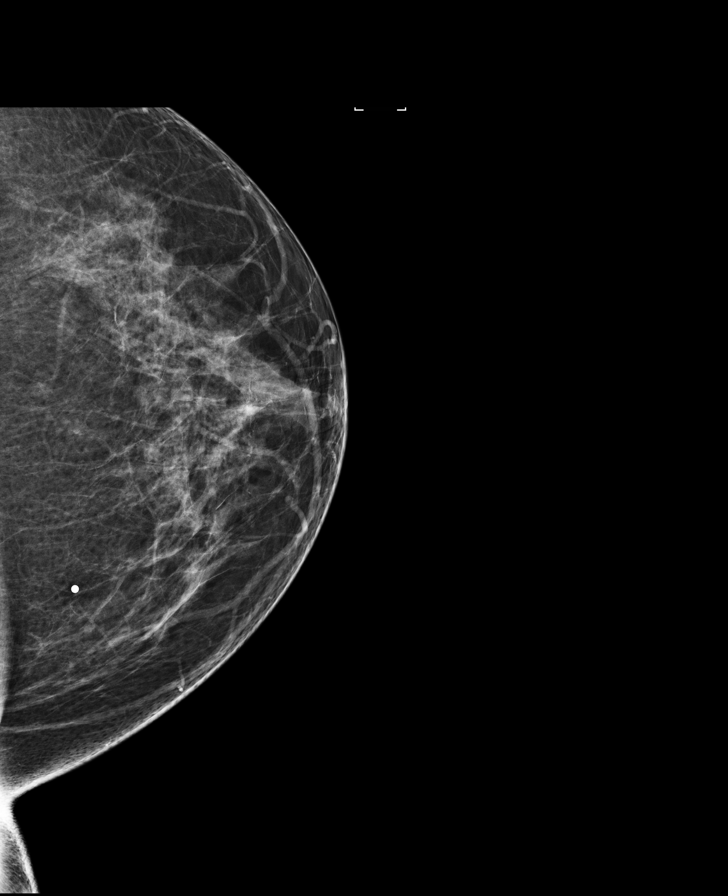

[R MLO synth-2D]
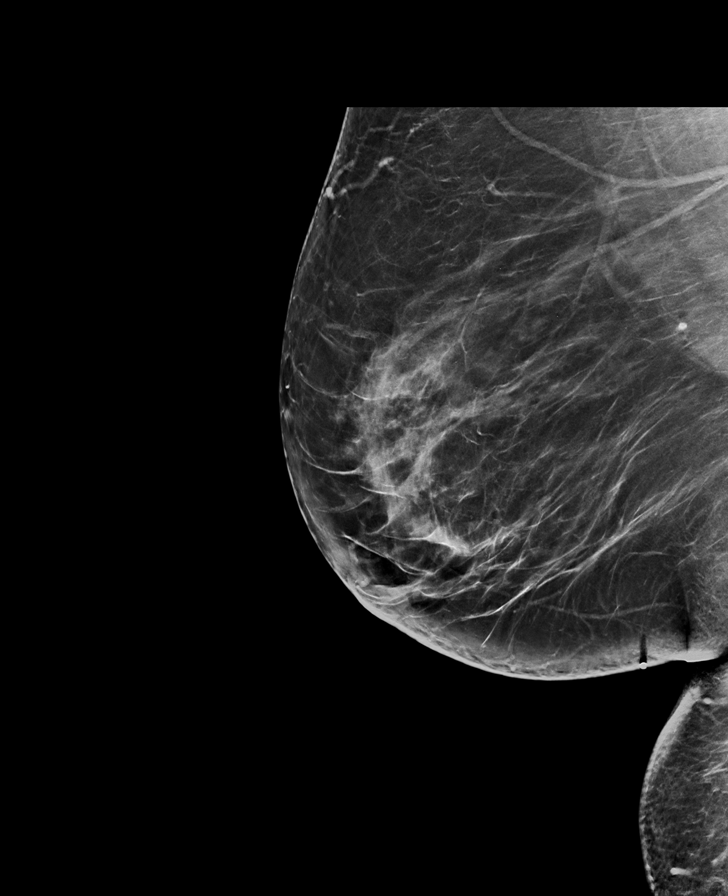

[L CC synth-2D]
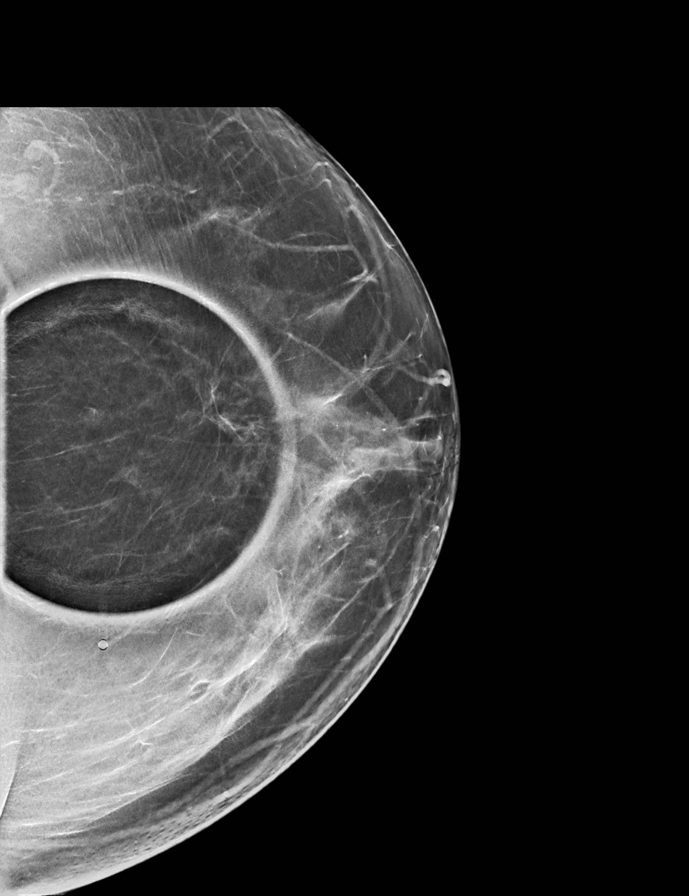

[R CC synth-2D]
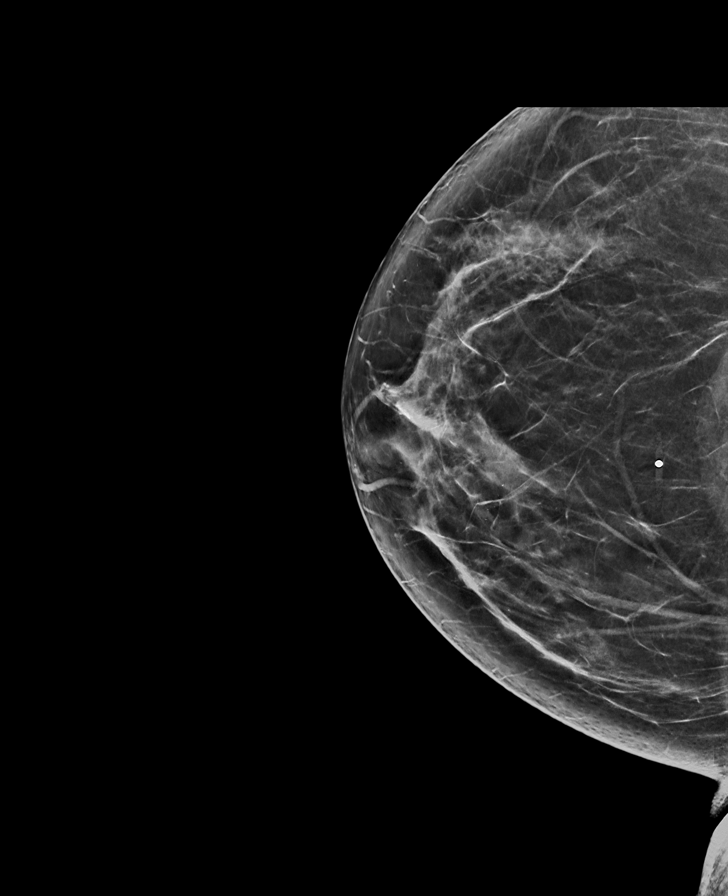

[L TAN]
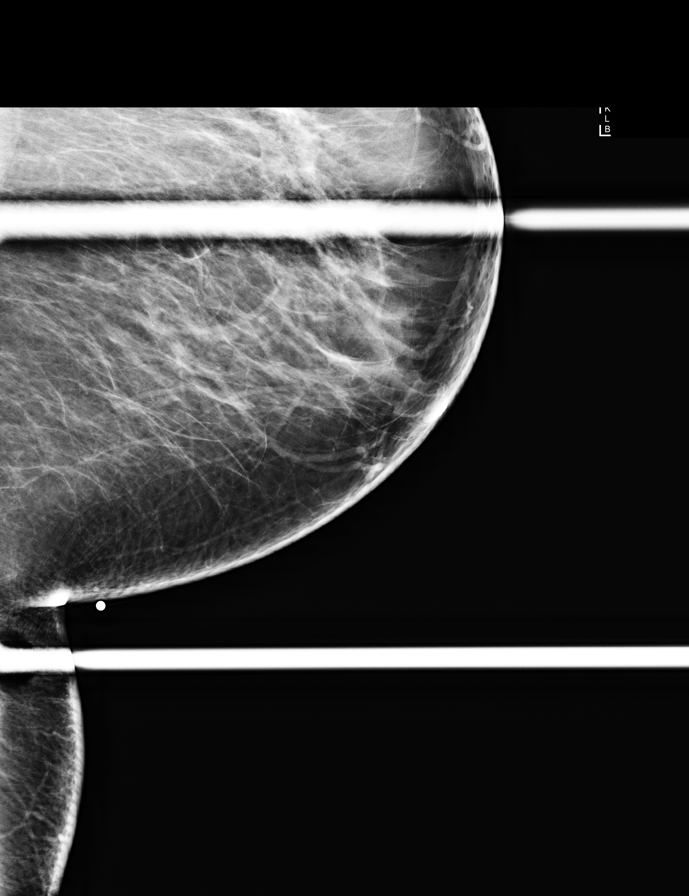

[R MLO]
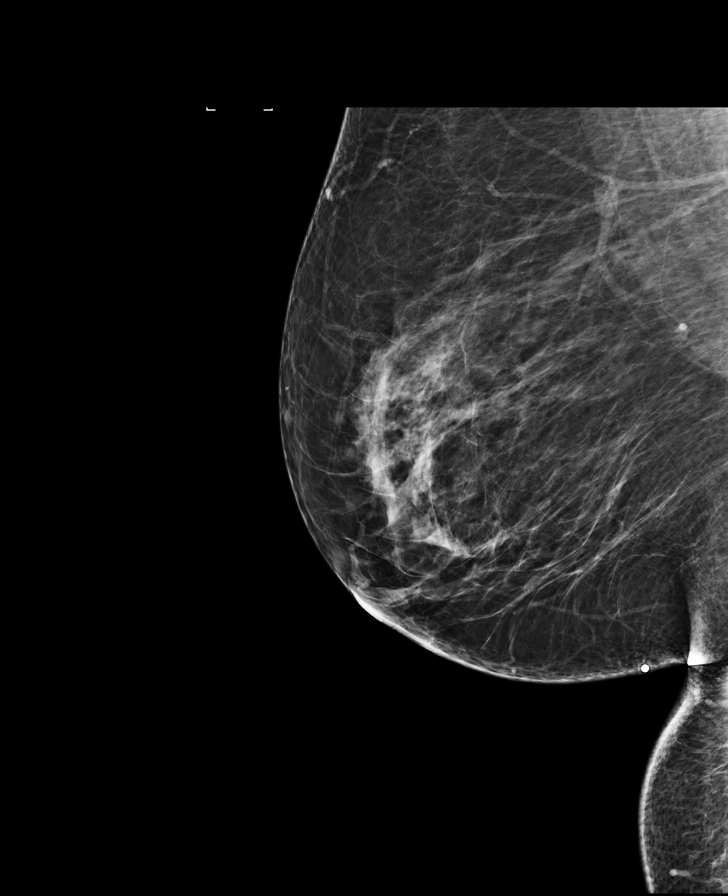

[8 of 40 positions shown; findings below may reference images not displayed]

ACR Breast Density Category c: The breast tissue is heterogeneously
dense, which may obscure small masses.
FINDINGS: No suspicious mass, calcifications, or other abnormality is
identified within the right breast. There is an oval, circumscribed,
low-density mass within the posterior, inferior left breast. This
mass may have been present previously but not well seen due to its
low density and the lack of prior tomosynthesis imaging There are
associated tiny punctate calcifications, similar to punctate
calcifications noted elsewhere within the breasts bilaterally. This
mass is thought likely to represent an area of cystic change.

Mammographic images were processed with CAD.

On physical exam, no discrete mass is felt in the patient's areas of
concern within the inferior breasts bilaterally.

Targeted ultrasound of the bilateral breasts was performed
demonstrating no suspicious cystic or solid sonographic finding in
the patient's areas of concern within either breast. No sonographic
correlate for the low-density mass seen within the posterior,
inferior left breast was identified.
IMPRESSION: Probably benign left breast mass.

RECOMMENDATION:
Options including short-term follow-up versus image guided biopsy
were discussed with the patient. The patient wishes to pursue
biopsy. This may be performed under either stereotactic or
tomosynthesis guidance.

I have discussed the findings and recommendations with the patient.
Results were also provided in writing at the conclusion of the
visit. If applicable, a reminder letter will be sent to the patient
regarding the next appointment.

BI-RADS CATEGORY  3: Probably benign.

## 2018-02-12 IMAGING — MG MM BREAST LOCALIZATION CLIP
2 series · 2 of 2 positions shown · non-contrast
Comparison: Previous exam(s).

CLINICAL DATA: Status post stereotactic guided core needle biopsy
of a 9 mm asymmetry with a few associated tiny microcalcifications
in the 6 o'clock position of the left breast, slightly laterally.

EXAM:
DIAGNOSTIC LEFT MAMMOGRAM POST STEREOTACTIC BIOPSY

[L CC]
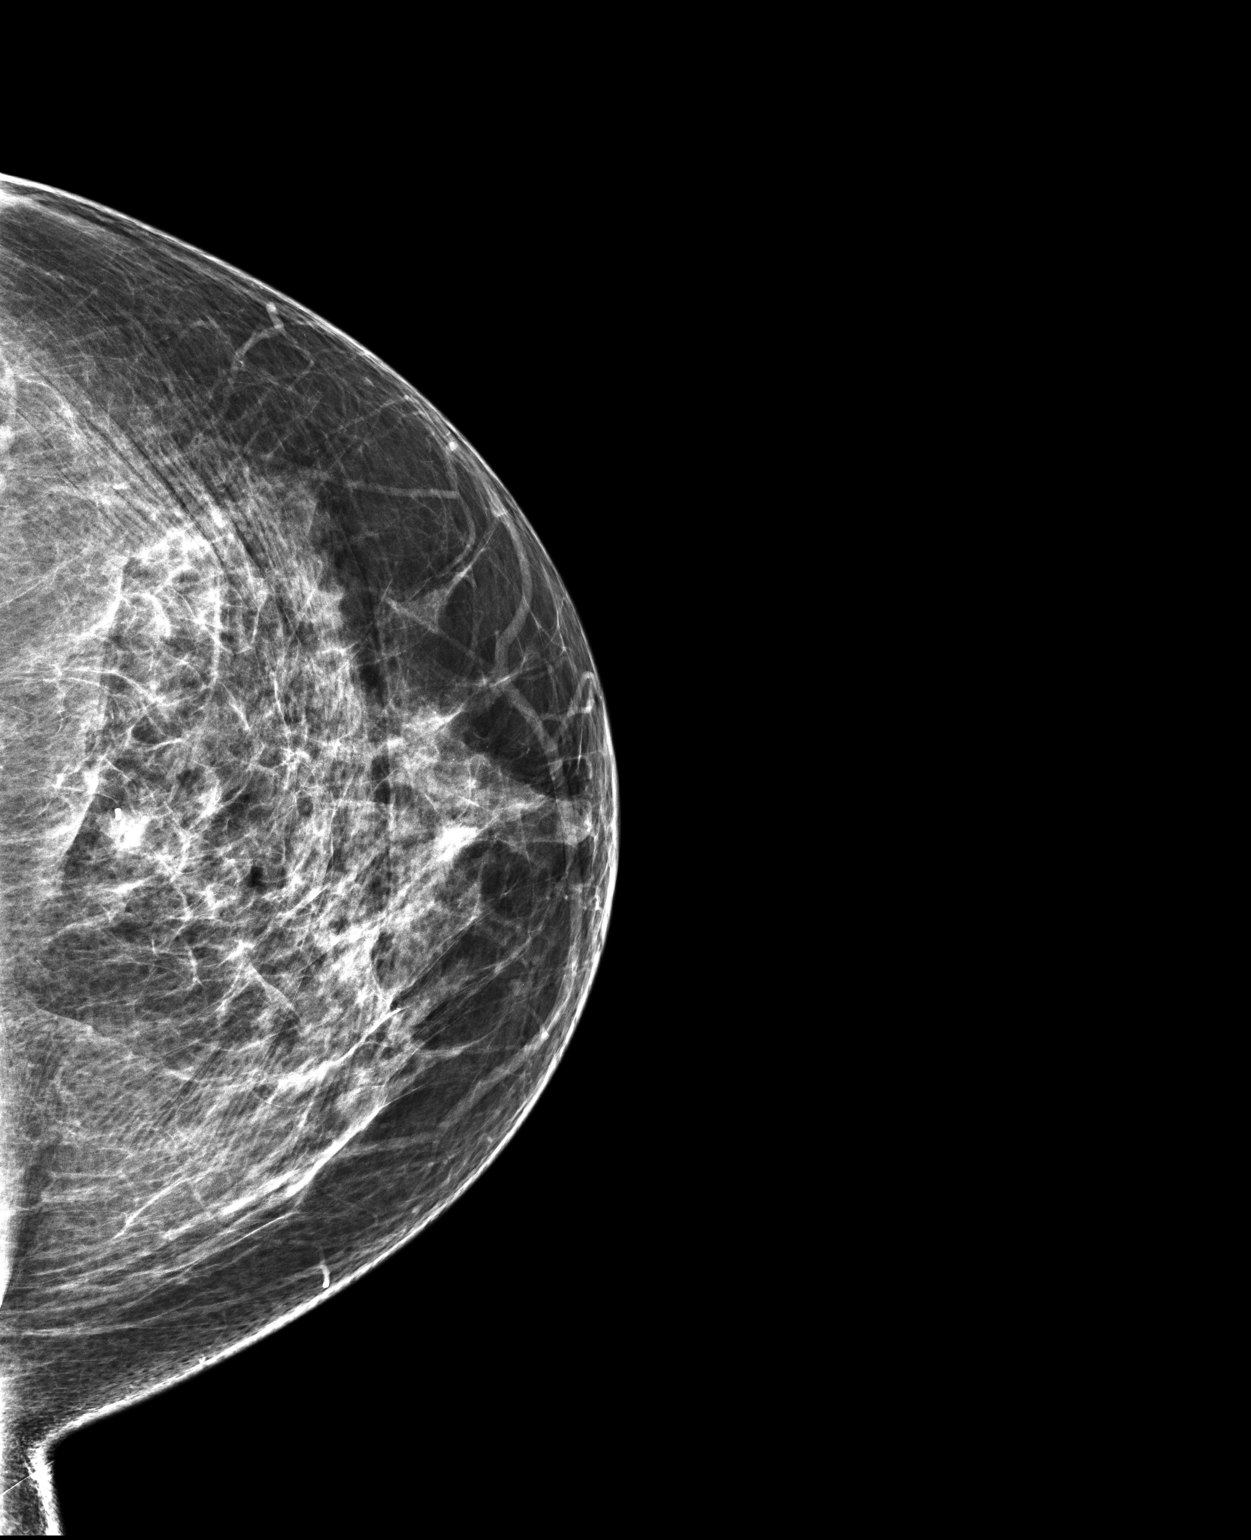

[L ML]
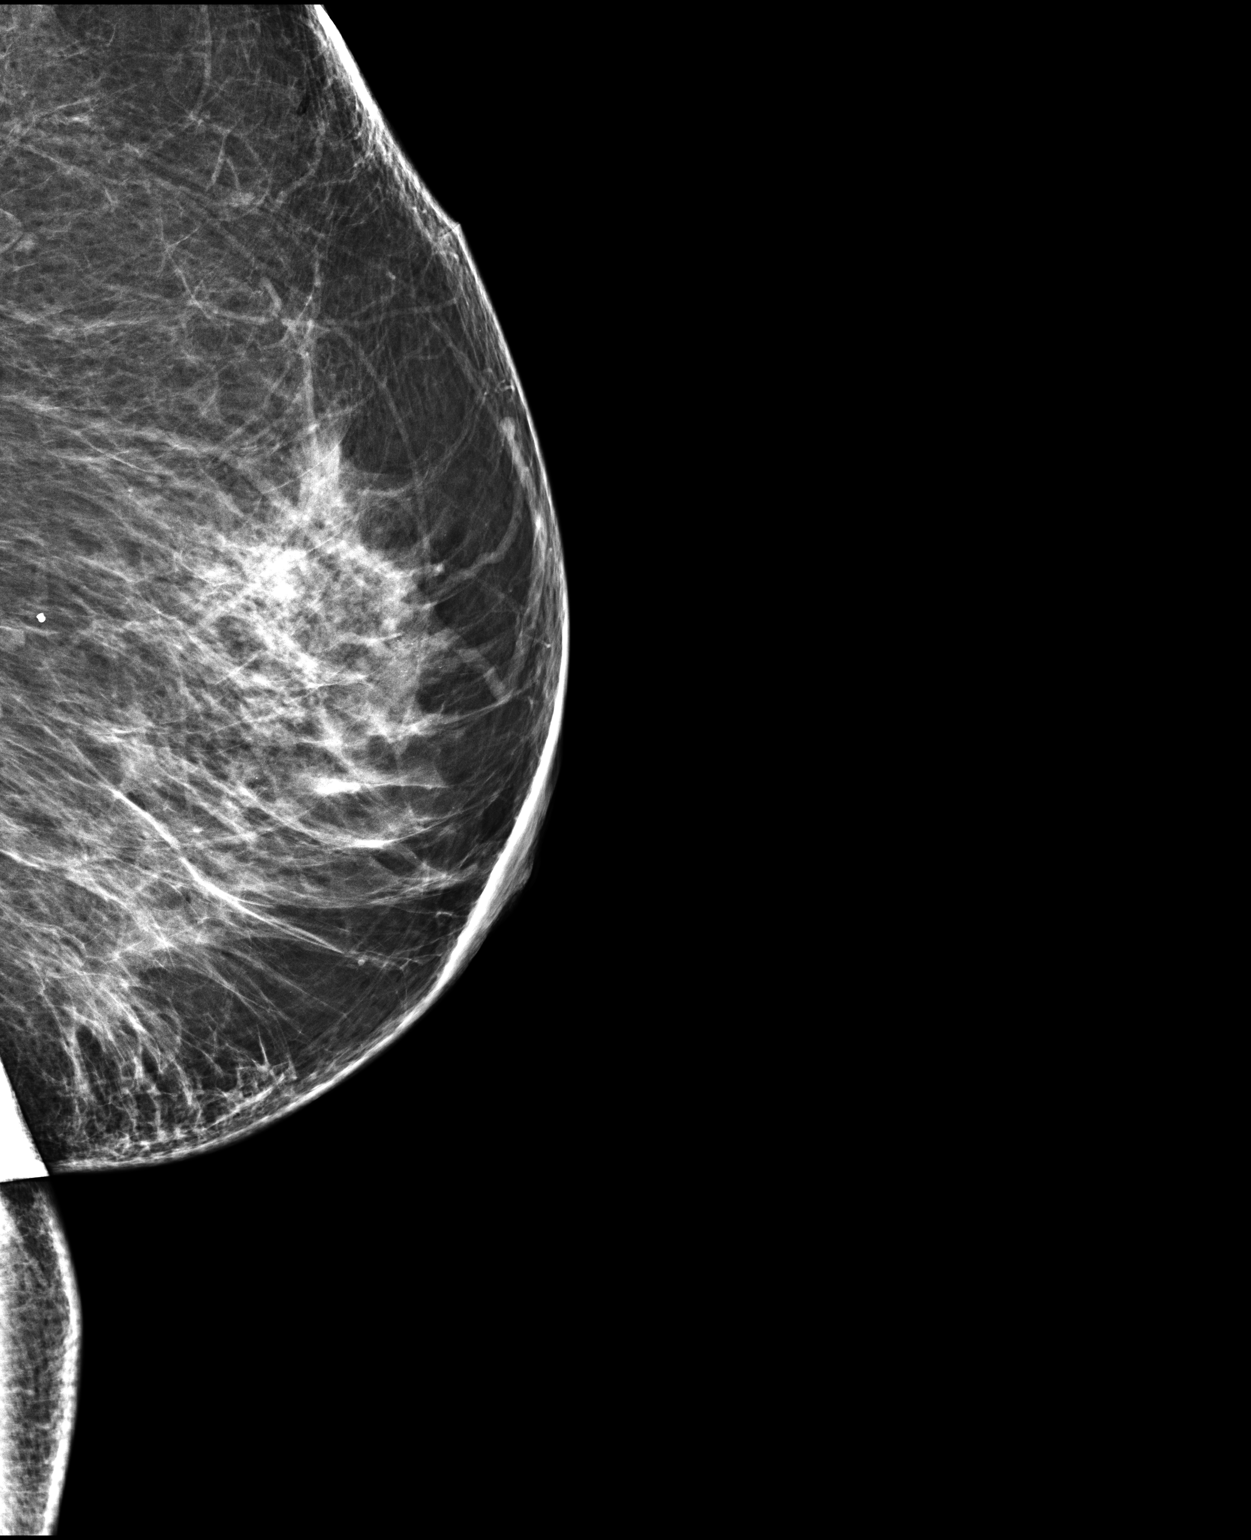

[2 of 2 positions shown; findings below may reference images not displayed]

FINDINGS: Mammographic images were obtained following stereotactic guided
biopsy of the recently demonstrated 9 mm oval asymmetry with a few
associated tiny microcalcifications in the 6 o'clock position of the
left breast. These demonstrate a bow tie shaped biopsy marker clip
at the expected location of the asymmetry in the craniocaudal
projection. No residual calcifications are visible at that location.
These may be obscured by post biopsy changes or may be absent
following biopsy.

In the true lateral projection, it is difficult to determine if the
clip is at the same depth as the previously seen asymmetry, since it
was not visible previously in the oblique projection. The clip is in
the middle depth of the breast in the true lateral projection,
slightly above the level of the nipple. The asymmetry was also
slightly above the level of the nipple in the recent 3D tomographic
images in the craniocaudal projection. On today's true lateral view,
the clip is projected anterior to a normal appearing lymph node.
IMPRESSION: Probable appropriate clip deployment following left breast
stereotactic guided core needle biopsy. If the biopsy results are
benign, a follow-up left 2D and 3D tomographic diagnostic mammogram
in 6 months would recommended.

Final Assessment: Post Procedure Mammograms for Marker Placement

## 2018-05-05 ENCOUNTER — Other Ambulatory Visit: Payer: Self-pay | Admitting: Acute Care

## 2018-05-05 DIAGNOSIS — M545 Low back pain: Principal | ICD-10-CM

## 2018-05-05 DIAGNOSIS — G8929 Other chronic pain: Secondary | ICD-10-CM

## 2018-05-05 DIAGNOSIS — R200 Anesthesia of skin: Secondary | ICD-10-CM

## 2018-05-22 ENCOUNTER — Encounter (INDEPENDENT_AMBULATORY_CARE_PROVIDER_SITE_OTHER): Payer: Self-pay

## 2018-05-22 ENCOUNTER — Ambulatory Visit
Admission: RE | Admit: 2018-05-22 | Discharge: 2018-05-22 | Disposition: A | Discharge status: Home / Self Care | Payer: Medicare Other | Source: Ambulatory Visit | Attending: Acute Care | Admitting: Acute Care

## 2018-05-22 DIAGNOSIS — G8929 Other chronic pain: Secondary | ICD-10-CM | POA: Diagnosis present

## 2018-05-22 DIAGNOSIS — M545 Low back pain, unspecified: Secondary | ICD-10-CM

## 2018-05-22 DIAGNOSIS — R200 Anesthesia of skin: Secondary | ICD-10-CM | POA: Diagnosis present

## 2018-08-08 IMAGING — CR DG ABDOMEN 2V
4 series · 4 of 4 positions shown · non-contrast
Comparison: 12/10/2013

CLINICAL DATA: LEFT flank and groin pain, LEFT lower quadrant pain,
constipation, weakness, shortness of breath, trouble urinating,
symptoms for 2 weeks, history diverticulitis, Crohn's disease,
diabetes mellitus, breast cancer

EXAM:
ABDOMEN - 2 VIEW

[abdomen erect (1 of 2)]
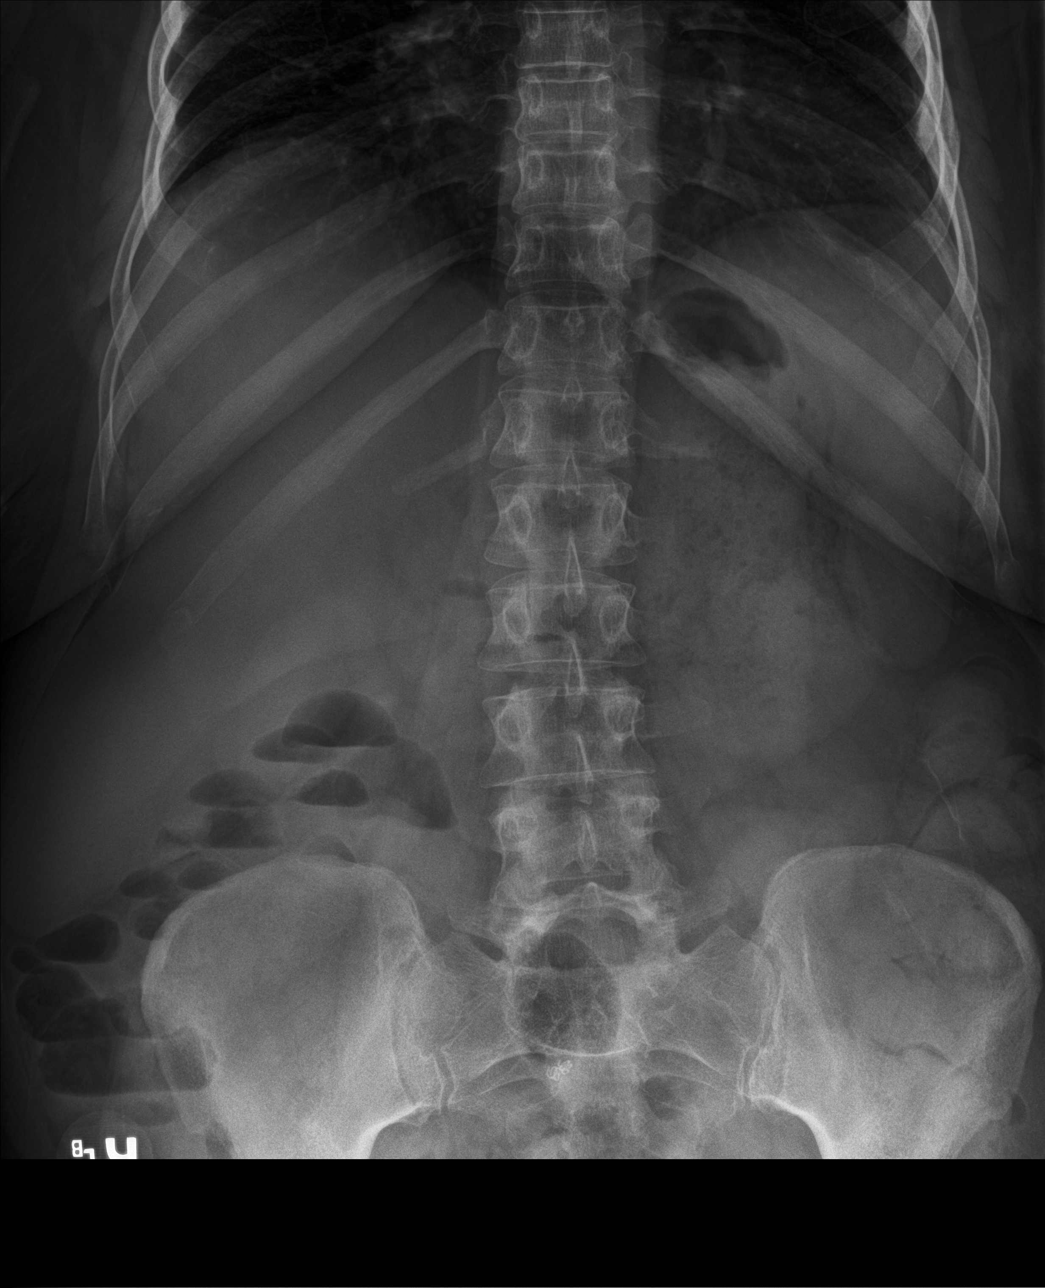

[abdomen erect (2 of 2)]
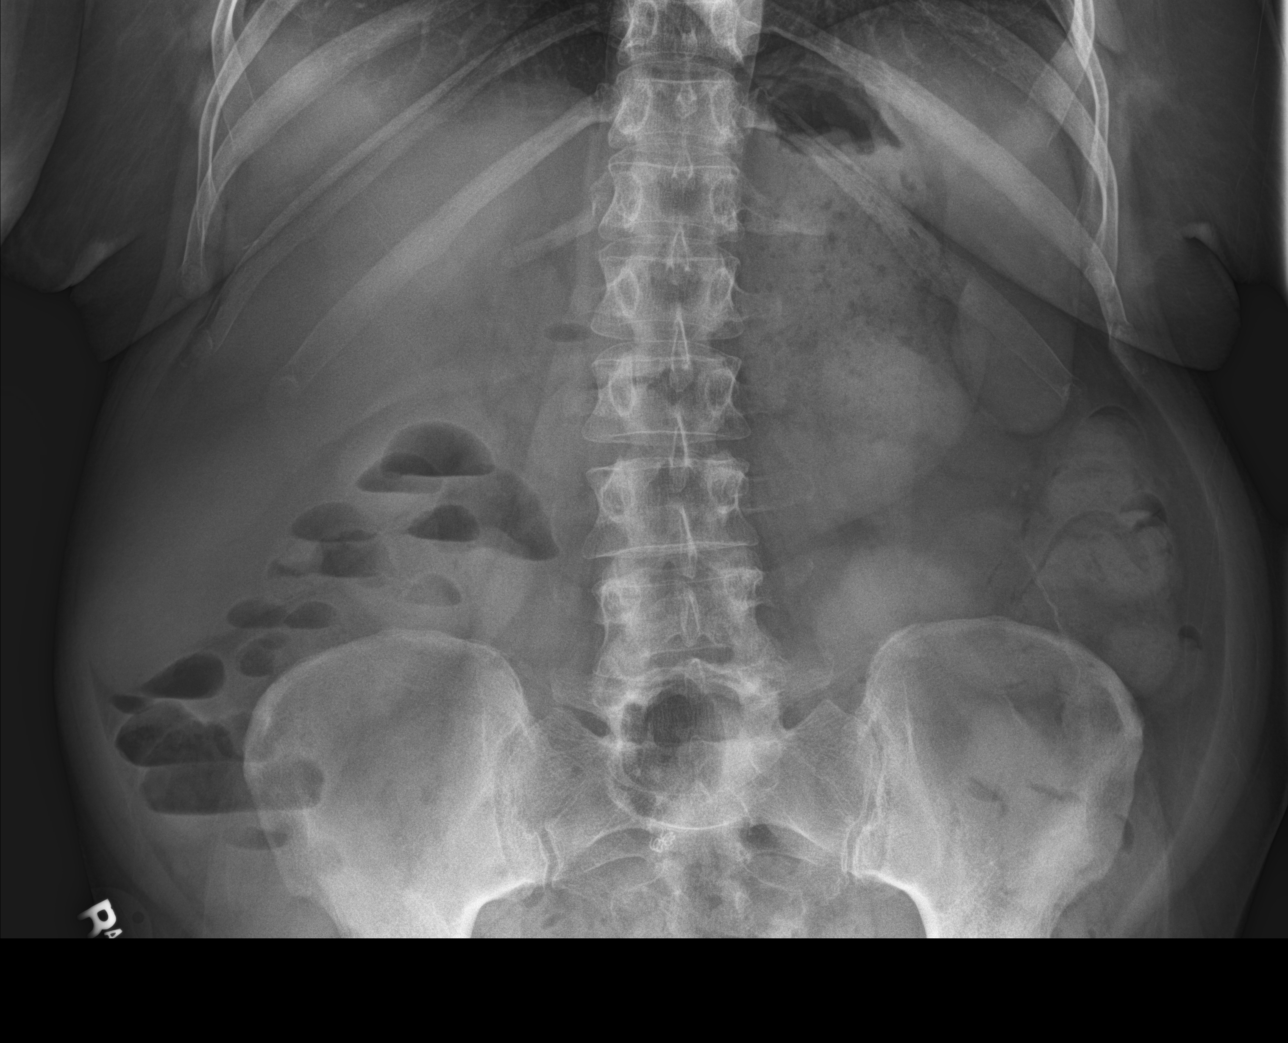

[abdomen supine (1 of 2)]
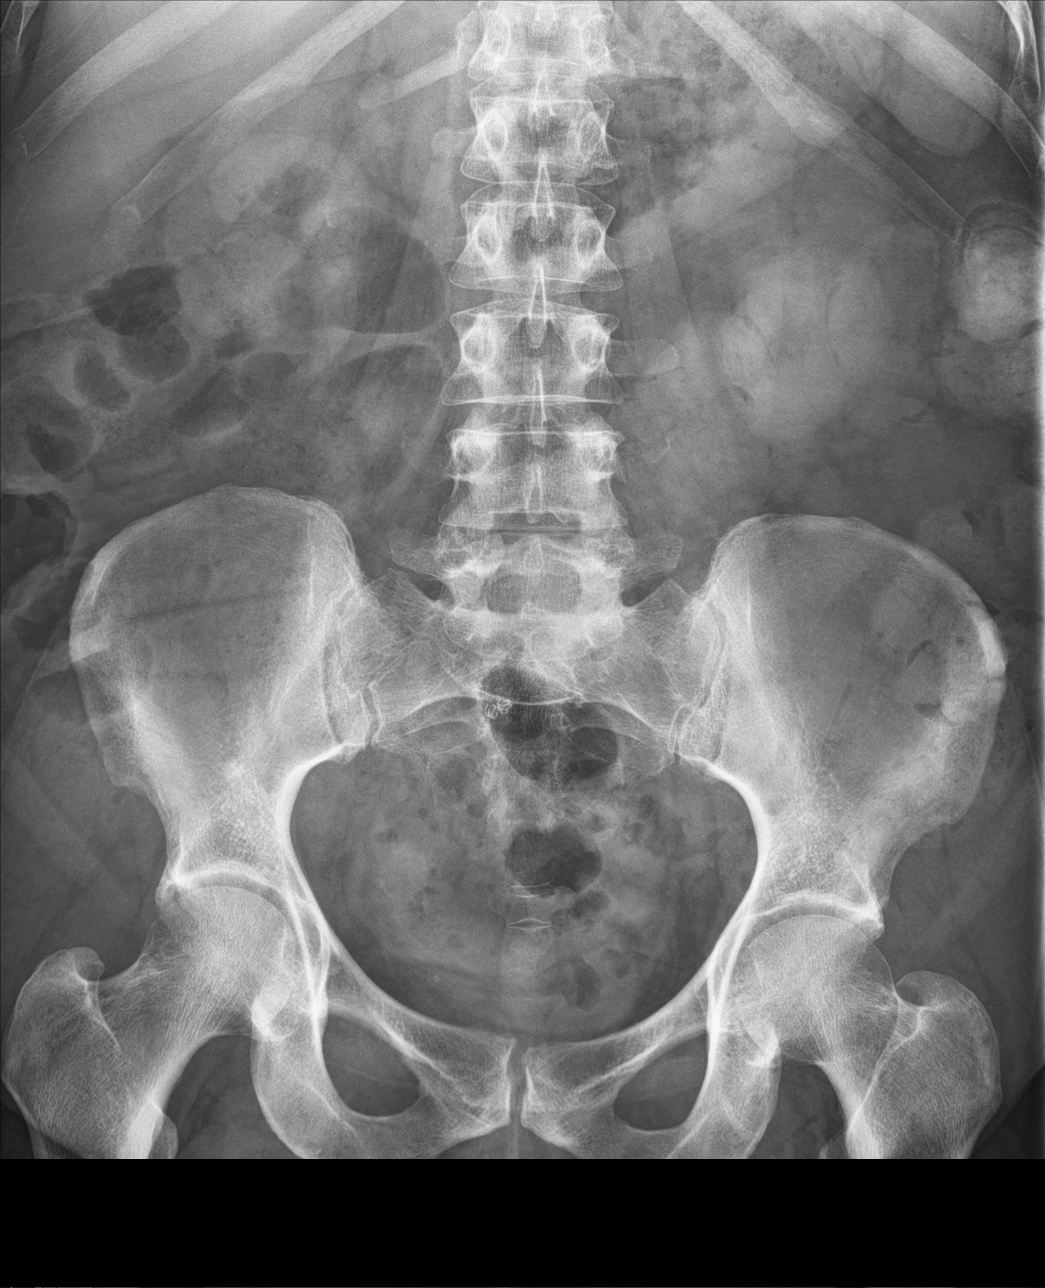

[abdomen supine (2 of 2)]
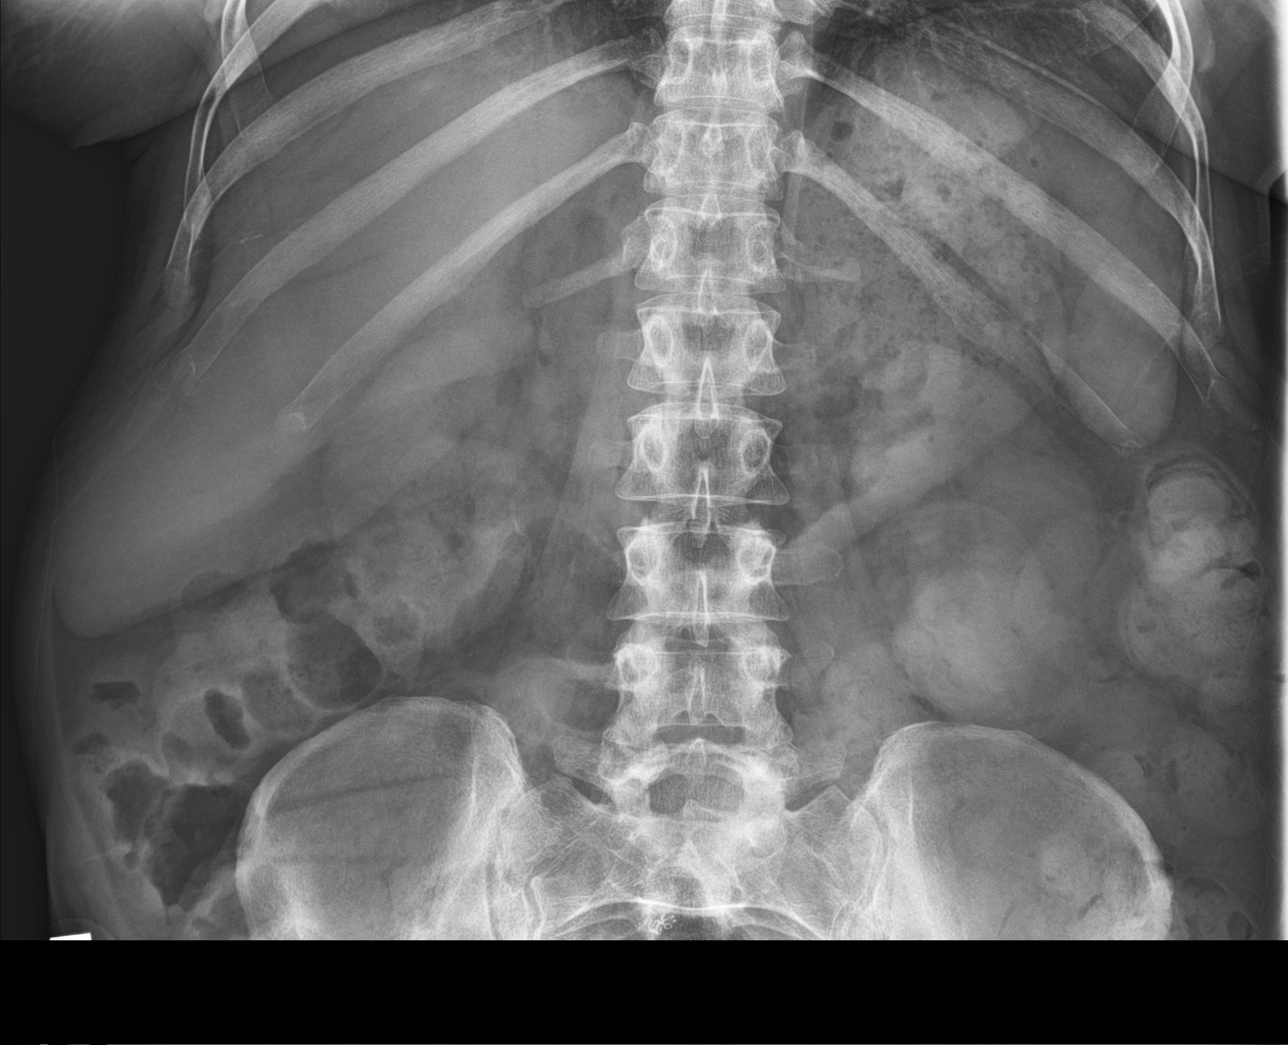

[4 of 4 positions shown; findings below may reference images not displayed]

FINDINGS: Food debris in stomach.

Scattered stool in colon.

No bowel dilatation, bowel wall thickening or free air.

Bones unremarkable.

Questionable 4 mm nonobstructing RIGHT renal calculus.
IMPRESSION: Nonspecific bowel gas pattern.

Questionable 4 mm nonobstructing RIGHT renal calculus.

## 2018-08-08 IMAGING — CR DG HIP (WITH OR WITHOUT PELVIS) 2-3V*L*
3 series · 3 of 3 positions shown · non-contrast
Comparison: None.

CLINICAL DATA: Left groin pain.

EXAM:
DG HIP (WITH OR WITHOUT PELVIS) 2-3V LEFT

[pelvis ap]
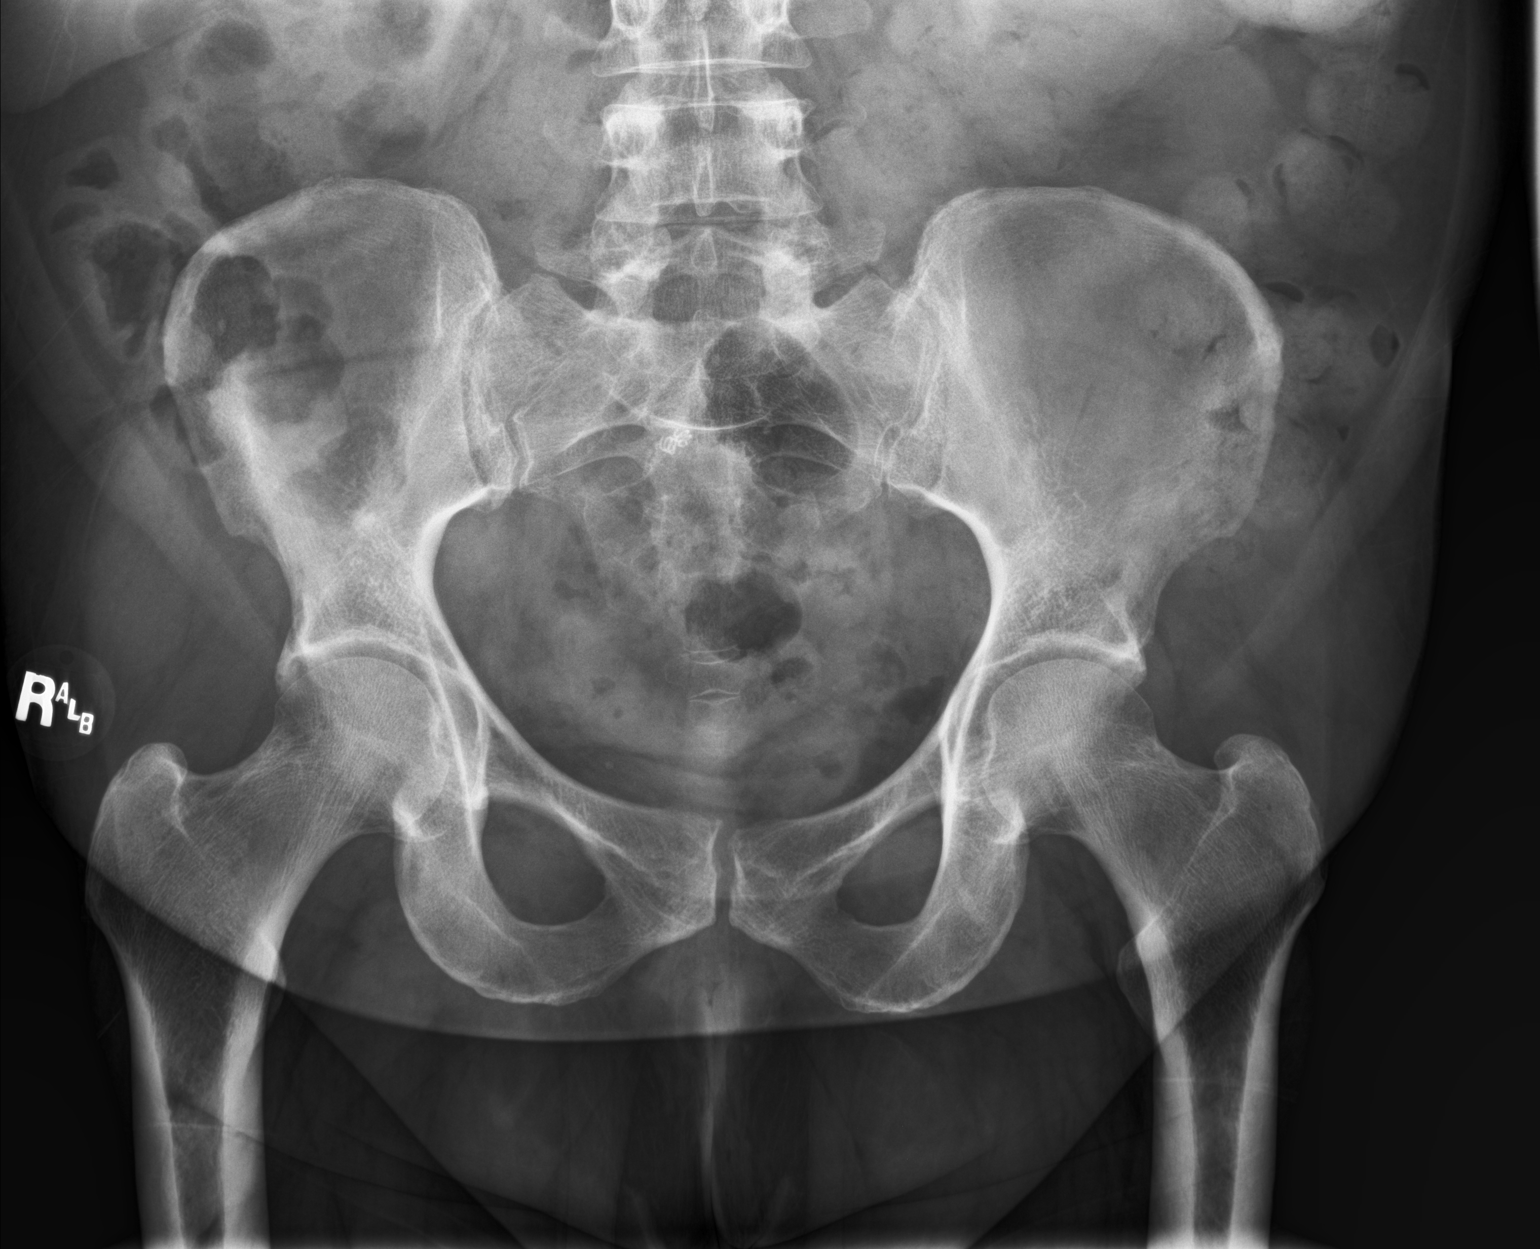

[hip ap]
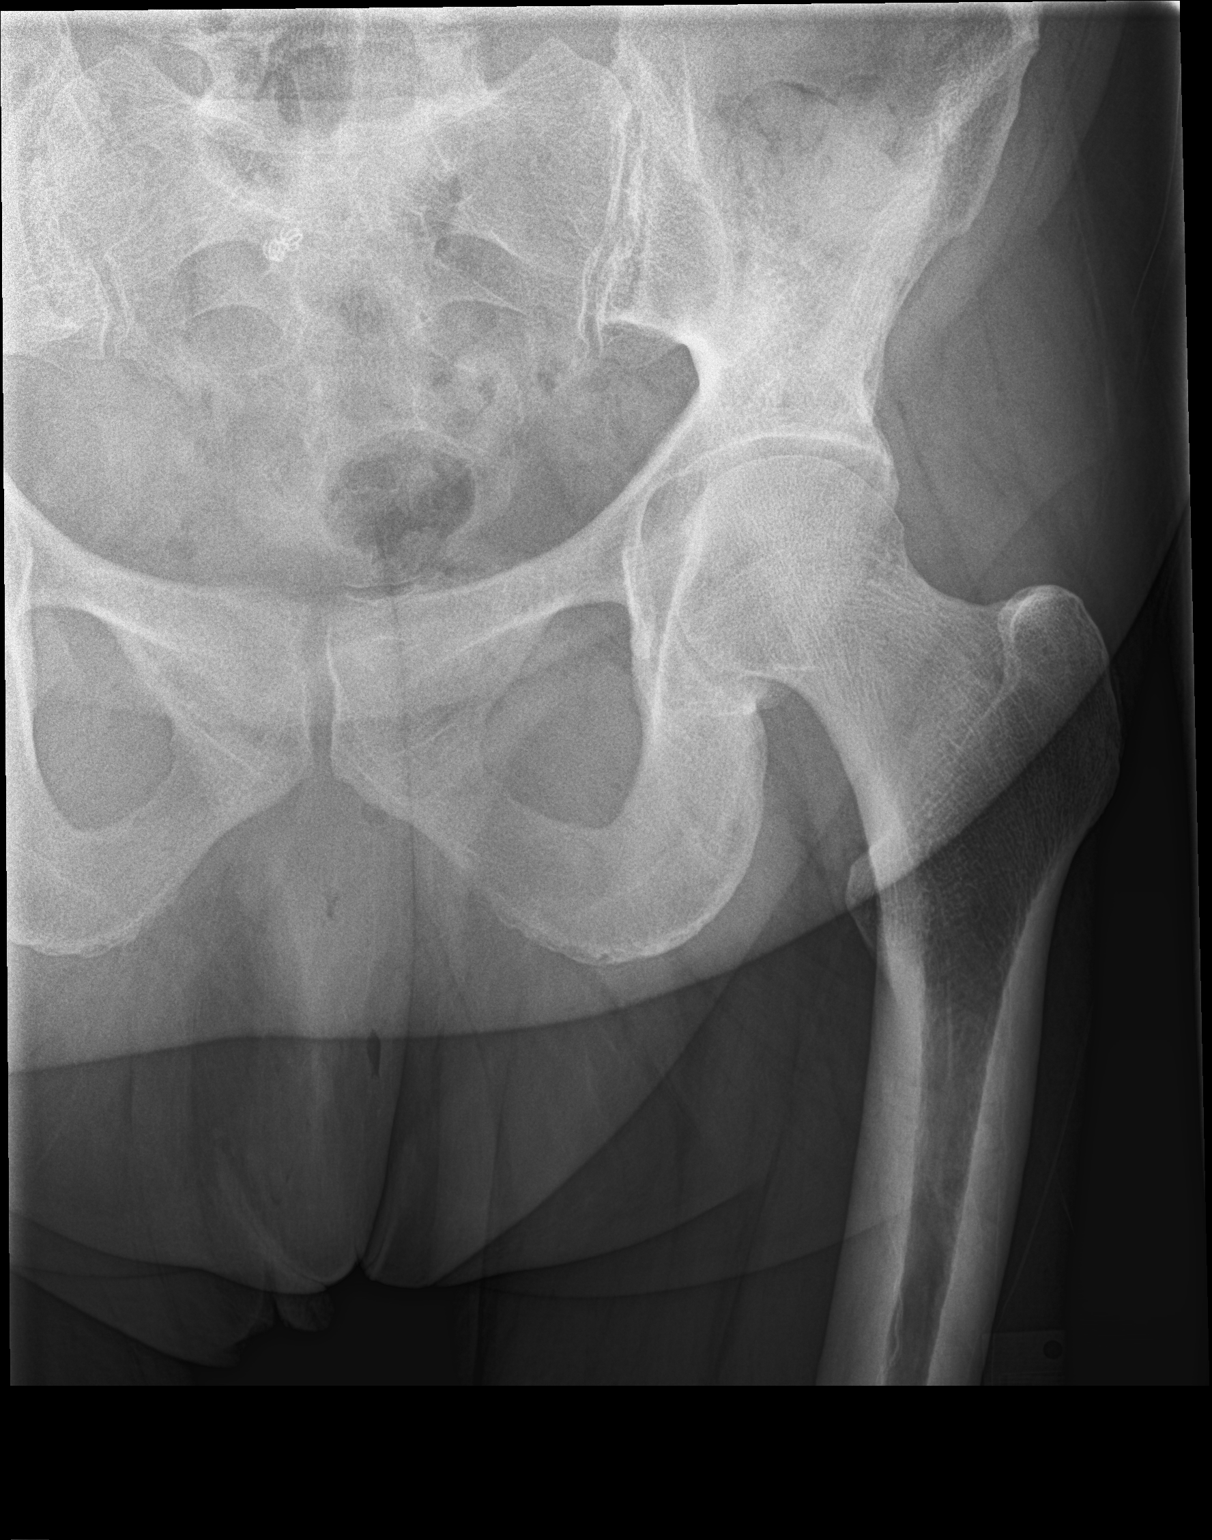

[hip lat]
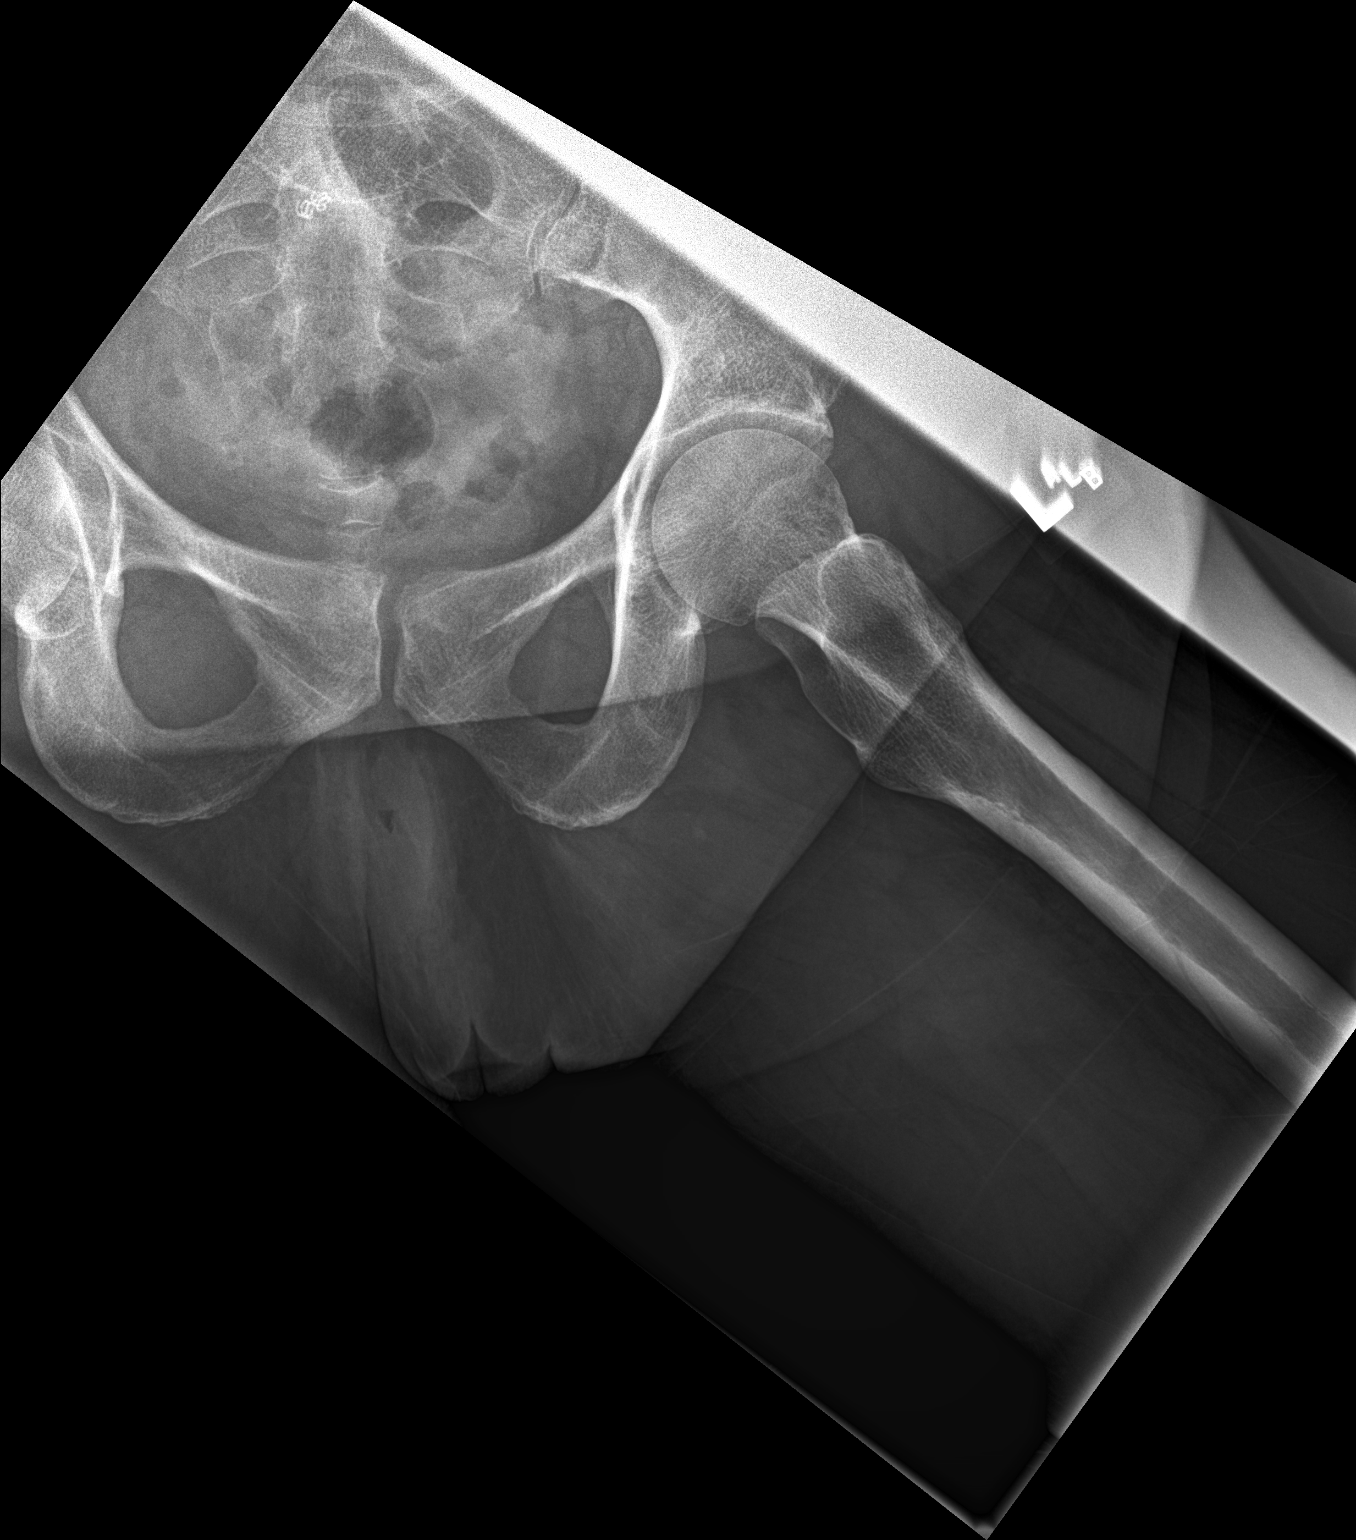

[3 of 3 positions shown; findings below may reference images not displayed]

FINDINGS: Bones of the left hip appear normal. Pelvic bones appear normal.
Bowel gas pattern is normal. No fecal impaction.
IMPRESSION: Normal left hip.

## 2018-10-17 ENCOUNTER — Ambulatory Visit: Payer: Self-pay | Admitting: Otolaryngology

## 2018-11-27 ENCOUNTER — Ambulatory Visit: Payer: Self-pay | Admitting: Otolaryngology

## 2018-12-23 ENCOUNTER — Other Ambulatory Visit
Admission: RE | Admit: 2018-12-23 | Discharge: 2018-12-23 | Disposition: A | Discharge status: Home / Self Care | Payer: Medicare Other | Source: Ambulatory Visit | Attending: Otolaryngology | Admitting: Otolaryngology

## 2018-12-23 ENCOUNTER — Other Ambulatory Visit: Payer: Self-pay

## 2018-12-23 DIAGNOSIS — Z01812 Encounter for preprocedural laboratory examination: Secondary | ICD-10-CM | POA: Insufficient documentation

## 2018-12-23 DIAGNOSIS — Z20828 Contact with and (suspected) exposure to other viral communicable diseases: Secondary | ICD-10-CM | POA: Insufficient documentation

## 2018-12-23 LAB — SARS CORONAVIRUS 2 (TAT 6-24 HRS): SARS Coronavirus 2: NEGATIVE

## 2018-12-24 NOTE — Discharge Instructions (Signed)
[facility] °[facility] °ENDOSCOPIC SINUS SURGERY °[facility] ° °What is Functional Endoscopic Sinus Surgery? ° The Surgery involves making the natural openings of the sinuses larger by removing the bony partitions that separate the sinuses from the nasal cavity.  The natural sinus lining is preserved as much as possible to allow the sinuses to resume normal function after the surgery.  In some patients nasal polyps (excessively swollen lining of the sinuses) may be removed to relieve obstruction of the sinus openings.  The surgery is performed through the nose using lighted scopes, which eliminates the need for incisions on the face.  A septoplasty is a different procedure which is sometimes performed with sinus surgery.  It involves straightening the boy partition that separates the two sides of your nose.  A crooked or deviated septum may need repair if is obstructing the sinuses or nasal airflow.  Turbinate reduction is also often performed during sinus surgery.  The turbinates are bony proturberances from the side walls of the nose which swell and can obstruct the nose in patients with sinus and allergy problems.  Their size can be surgically reduced to help relieve nasal obstruction. ° °What Can Sinus Surgery Do For Me? ° Sinus surgery can reduce the frequency of sinus infections requiring antibiotic treatment.  This can provide improvement in nasal congestion, post-nasal drainage, facial pressure and nasal obstruction.  Surgery will NOT prevent you from ever having an infection again, so it usually only for patients who get infections 4 or more times yearly requiring antibiotics, or for infections that do not clear with antibiotics.  It will not cure nasal allergies, so patients with allergies may still require medication to treat their allergies after surgery. Surgery may improve headaches related to sinusitis, however, some people will continue to  require medication to control sinus headaches related to allergies.  Surgery will do nothing for other forms of headache (migraine, tension or cluster). ° °What Are the Risks of Endoscopic Sinus Surgery? ° Current techniques allow surgery to be performed safely with little risk, however, there are rare complications that patients should be aware of.  Because the sinuses are located around the eyes, there is risk of eye injury, including blindness, though again, this would be quite rare. This is usually a result of bleeding behind the eye during surgery, which puts the vision oat risk, though there are treatments to protect the vision and prevent permanent disrupted by surgery causing a leak of the spinal fluid that surrounds the brain.  More serious complications would include bleeding inside the brain cavity or damage to the brain.  Again, all of these complications are uncommon, and spinal fluid leaks can be safely managed surgically if they occur.  The most common complication of sinus surgery is bleeding from the nose, which may require packing or cauterization of the nose.  Continued sinus have polyps may experience recurrence of the polyps requiring revision surgery.  Alterations of sense of smell or injury to the tear ducts are also rare complications.  ° °What is the Surgery Like, and what is the Recovery? ° The Surgery usually takes a couple of hours to perform, and is usually performed under a general anesthetic (completely asleep).  Patients are usually discharged home after a couple of hours.  Sometimes during surgery it is necessary to pack the nose to control bleeding, and the packing is left in place for 24 - 48 hours, and removed by your surgeon.    If a septoplasty was performed during the procedure, there is often a splint placed which must be removed after 5-7 days.   °Discomfort: Pain is usually mild to moderate, and can be controlled by prescription pain medication or acetaminophen (Tylenol).   Aspirin, Ibuprofen (Advil, Motrin), or Naprosyn (Aleve) should be avoided, as they can cause increased bleeding.  Most patients feel sinus pressure like they have a bad head cold for several days.  Sleeping with your head elevated can help reduce swelling and facial pressure, as can ice packs over the face.  A humidifier may be helpful to keep the mucous and blood from drying in the nose.  ° °Diet: There are no specific diet restrictions, however, you should generally start with clear liquids and a light diet of bland foods because the anesthetic can cause some nausea.  Advance your diet depending on how your stomach feels.  Taking your pain medication with food will often help reduce stomach upset which pain medications can cause. ° °Nasal Saline Irrigation: It is important to remove blood clots and dried mucous from the nose as it is healing.  This is done by having you irrigate the nose at least 3 - 4 times daily with a salt water solution.  We recommend using NeilMed Sinus Rinse (available at the drug store).  Fill the squeeze bottle with the solution, bend over a sink, and insert the tip of the squeeze bottle into the nose ½ of an inch.  Point the tip of the squeeze bottle towards the inside corner of the eye on the same side your irrigating.  Squeeze the bottle and gently irrigate the nose.  If you bend forward as you do this, most of the fluid will flow back out of the nose, instead of down your throat.   The solution should be warm, near body temperature, when you irrigate.   Each time you irrigate, you should use a full squeeze bottle.  ° °Note that if you are instructed to use Nasal Steroid Sprays at any time after your surgery, irrigate with saline BEFORE using the steroid spray, so you do not wash it all out of the nose. °Another product, Nasal Saline Gel (such as AYR Nasal Saline Gel) can be applied in each nostril 3 - 4 times daily to moisture the nose and reduce scabbing or crusting. ° °Bleeding:   Bloody drainage from the nose can be expected for several days, and patients are instructed to irrigate their nose frequently with salt water to help remove mucous and blood clots.  The drainage may be dark red or brown, though some fresh blood may be seen intermittently, especially after irrigation.  Do not blow you nose, as bleeding may occur. If you must sneeze, keep your mouth open to allow air to escape through your mouth. ° °If heavy bleeding occurs: Irrigate the nose with saline to rinse out clots, then spray the nose 3 - 4 times with Afrin Nasal Decongestant Spray.  The spray will constrict the blood vessels to slow bleeding.  Pinch the lower half of your nose shut to apply pressure, and lay down with your head elevated.  Ice packs over the nose may help as well. If bleeding persists despite these measures, you should notify your doctor.  Do not use the Afrin routinely to control nasal congestion after surgery, as it can result in worsening congestion and may affect healing.  ° ° ° °Activity: Return to work varies among patients. Most patients will be   out of work at least 5 - 7 days to recover.  Patient may return to work after they are off of narcotic pain medication, and feeling well enough to perform the functions of their job.  Patients must avoid heavy lifting (over 10 pounds) or strenuous physical for 2 weeks after surgery, so your employer may need to assign you to light duty, or keep you out of work longer if light duty is not possible.  NOTE: you should not drive, operate dangerous machinery, do any mentally demanding tasks or make any important legal or financial decisions while on narcotic pain medication and recovering from the general anesthetic.  °  °Call Your Doctor Immediately if You Have Any of the Following: °1. Bleeding that you cannot control with the above measures °2. Loss of vision, double vision, bulging of the eye or black eyes. °3. Fever over 101 degrees °4. Neck stiffness with  severe headache, fever, nausea and change in mental state. °You are always encourage to call anytime with concerns, however, please call with requests for pain medication refills during office hours. ° °Office Endoscopy: During follow-up visits your doctor will remove any packing or splints that may have been placed and evaluate and clean your sinuses endoscopically.  Topical anesthetic will be used to make this as comfortable as possible, though you may want to take your pain medication prior to the visit.  How often this will need to be done varies from patient to patient.  After complete recovery from the surgery, you may need follow-up endoscopy from time to time, particularly if there is concern of recurrent infection or nasal polyps. ° ° °General Anesthesia, Adult, Care After °This sheet gives you information about how to care for yourself after your procedure. Your health care provider may also give you more specific instructions. If you have problems or questions, contact your health care provider. °What can I expect after the procedure? °After the procedure, the following side effects are common: °· Pain or discomfort at the IV site. °· Nausea. °· Vomiting. °· Sore throat. °· Trouble concentrating. °· Feeling cold or chills. °· Weak or tired. °· Sleepiness and fatigue. °· Soreness and body aches. These side effects can affect parts of the body that were not involved in surgery. °Follow these instructions at home: ° °For at least 24 hours after the procedure: °· Have a responsible adult stay with you. It is important to have someone help care for you until you are awake and alert. °· Rest as needed. °· Do not: °? Participate in activities in which you could fall or become injured. °? Drive. °? Use heavy machinery. °? Drink alcohol. °? Take sleeping pills or medicines that cause drowsiness. °? Make important decisions or sign legal documents. °? Take care of children on your own. °Eating and  drinking °· Follow any instructions from your health care provider about eating or drinking restrictions. °· When you feel hungry, start by eating small amounts of foods that are soft and easy to digest (bland), such as toast. Gradually return to your regular diet. °· Drink enough fluid to keep your urine pale yellow. °· If you vomit, rehydrate by drinking water, juice, or clear broth. °General instructions °· If you have sleep apnea, surgery and certain medicines can increase your risk for breathing problems. Follow instructions from your health care provider about wearing your sleep device: °? Anytime you are sleeping, including during daytime naps. °? While taking prescription pain medicines, sleeping medicines, or medicines   that make you drowsy. °· Return to your normal activities as told by your health care provider. Ask your health care provider what activities are safe for you. °· Take over-the-counter and prescription medicines only as told by your health care provider. °· If you smoke, do not smoke without supervision. °· Keep all follow-up visits as told by your health care provider. This is important. °Contact a health care provider if: °· You have nausea or vomiting that does not get better with medicine. °· You cannot eat or drink without vomiting. °· You have pain that does not get better with medicine. °· You are unable to pass urine. °· You develop a skin rash. °· You have a fever. °· You have redness around your IV site that gets worse. °Get help right away if: °· You have difficulty breathing. °· You have chest pain. °· You have blood in your urine or stool, or you vomit blood. °Summary °· After the procedure, it is common to have a sore throat or nausea. It is also common to feel tired. °· Have a responsible adult stay with you for the first 24 hours after general anesthesia. It is important to have someone help care for you until you are awake and alert. °· When you feel hungry, start by eating  small amounts of foods that are soft and easy to digest (bland), such as toast. Gradually return to your regular diet. °· Drink enough fluid to keep your urine pale yellow. °· Return to your normal activities as told by your health care provider. Ask your health care provider what activities are safe for you. °This information is not intended to replace advice given to you by your health care provider. Make sure you discuss any questions you have with your health care provider. °Document Released: 08/06/2000 Document Revised: 05/03/2017 Document Reviewed: 12/14/2016 °Elsevier Patient Education © 2020 [employer] ° °

## 2018-12-25 ENCOUNTER — Ambulatory Visit
Admission: RE | Admit: 2018-12-25 | Discharge: 2018-12-25 | Disposition: A | Discharge status: Home / Self Care | Payer: Medicare Other | Attending: Otolaryngology | Admitting: Otolaryngology

## 2018-12-25 ENCOUNTER — Encounter
Admission: RE | Disposition: A | Discharge status: Home / Self Care | Payer: Self-pay | Source: Home / Self Care | Attending: Otolaryngology

## 2018-12-25 ENCOUNTER — Ambulatory Visit: Payer: Medicare Other | Admitting: Anesthesiology

## 2018-12-25 DIAGNOSIS — F329 Major depressive disorder, single episode, unspecified: Secondary | ICD-10-CM | POA: Diagnosis not present

## 2018-12-25 DIAGNOSIS — K219 Gastro-esophageal reflux disease without esophagitis: Secondary | ICD-10-CM | POA: Insufficient documentation

## 2018-12-25 DIAGNOSIS — H409 Unspecified glaucoma: Secondary | ICD-10-CM | POA: Insufficient documentation

## 2018-12-25 DIAGNOSIS — J45909 Unspecified asthma, uncomplicated: Secondary | ICD-10-CM | POA: Insufficient documentation

## 2018-12-25 DIAGNOSIS — J342 Deviated nasal septum: Secondary | ICD-10-CM | POA: Insufficient documentation

## 2018-12-25 DIAGNOSIS — F419 Anxiety disorder, unspecified: Secondary | ICD-10-CM | POA: Insufficient documentation

## 2018-12-25 DIAGNOSIS — Z794 Long term (current) use of insulin: Secondary | ICD-10-CM | POA: Insufficient documentation

## 2018-12-25 DIAGNOSIS — Z79899 Other long term (current) drug therapy: Secondary | ICD-10-CM | POA: Insufficient documentation

## 2018-12-25 DIAGNOSIS — I10 Essential (primary) hypertension: Secondary | ICD-10-CM | POA: Insufficient documentation

## 2018-12-25 DIAGNOSIS — E785 Hyperlipidemia, unspecified: Secondary | ICD-10-CM | POA: Diagnosis not present

## 2018-12-25 DIAGNOSIS — J343 Hypertrophy of nasal turbinates: Secondary | ICD-10-CM | POA: Diagnosis not present

## 2018-12-25 DIAGNOSIS — E669 Obesity, unspecified: Secondary | ICD-10-CM | POA: Diagnosis not present

## 2018-12-25 DIAGNOSIS — J3489 Other specified disorders of nose and nasal sinuses: Secondary | ICD-10-CM | POA: Insufficient documentation

## 2018-12-25 DIAGNOSIS — G43909 Migraine, unspecified, not intractable, without status migrainosus: Secondary | ICD-10-CM | POA: Insufficient documentation

## 2018-12-25 DIAGNOSIS — E1165 Type 2 diabetes mellitus with hyperglycemia: Secondary | ICD-10-CM | POA: Diagnosis not present

## 2018-12-25 HISTORY — PX: NASAL SEPTOPLASTY W/ TURBINOPLASTY: SHX2070

## 2018-12-25 HISTORY — DX: Carpal tunnel syndrome, unspecified upper limb: G56.00

## 2018-12-25 HISTORY — DX: Gastro-esophageal reflux disease without esophagitis: K21.9

## 2018-12-25 HISTORY — DX: Unspecified asthma, uncomplicated: J45.909

## 2018-12-25 HISTORY — DX: Headache, unspecified: R51.9

## 2018-12-25 HISTORY — DX: Unspecified osteoarthritis, unspecified site: M19.90

## 2018-12-25 HISTORY — DX: Unspecified glaucoma: H40.9

## 2018-12-25 HISTORY — DX: Essential (primary) hypertension: I10

## 2018-12-25 LAB — GLUCOSE, CAPILLARY
Glucose-Capillary: 266 mg/dL — ABNORMAL HIGH (ref 70–99)
Glucose-Capillary: 238 mg/dL — ABNORMAL HIGH (ref 70–99)

## 2018-12-25 LAB — POCT PREGNANCY, URINE: Preg Test, Ur: NEGATIVE

## 2018-12-25 SURGERY — SEPTOPLASTY, NOSE, WITH NASAL TURBINATE REDUCTION
Anesthesia: General | Site: Nose | Laterality: Bilateral

## 2018-12-25 MED ORDER — SUCCINYLCHOLINE CHLORIDE 20 MG/ML IJ SOLN
INTRAMUSCULAR | Status: DC | PRN
  Administered 2018-12-25: 100 mg via INTRAVENOUS

## 2018-12-25 MED ORDER — LIDOCAINE-EPINEPHRINE 1 %-1:100000 IJ SOLN
INTRAMUSCULAR | Status: DC | PRN
  Administered 2018-12-25: 6 mL

## 2018-12-25 MED ORDER — FENTANYL CITRATE (PF) 100 MCG/2ML IJ SOLN: 25.0000 ug | INTRAMUSCULAR | Status: DC | PRN

## 2018-12-25 MED ORDER — ACETAMINOPHEN 325 MG PO TABS: 325.0000 mg | ORAL_TABLET | ORAL | Status: DC | PRN

## 2018-12-25 MED ORDER — CEPHALEXIN 500 MG PO CAPS: 500.0000 mg | ORAL_CAPSULE | Freq: Two times a day (BID) | ORAL | 0 refills | Status: DC

## 2018-12-25 MED ORDER — PREDNISONE 10 MG PO TABS: ORAL_TABLET | ORAL | 0 refills | Status: DC

## 2018-12-25 MED ORDER — LACTATED RINGERS IV SOLN
INTRAVENOUS | Status: DC
  Administered 2018-12-25: 07:00:00 via INTRAVENOUS

## 2018-12-25 MED ORDER — OXYCODONE HCL 5 MG/5ML PO SOLN: 5.0000 mg | Freq: Once | ORAL | Status: AC | PRN

## 2018-12-25 MED ORDER — ONDANSETRON HCL 4 MG/2ML IJ SOLN
4.0000 mg | Freq: Once | INTRAMUSCULAR | Status: AC | PRN
  Administered 2018-12-25: 4 mg via INTRAVENOUS

## 2018-12-25 MED ORDER — DEXAMETHASONE SODIUM PHOSPHATE 4 MG/ML IJ SOLN
INTRAMUSCULAR | Status: DC | PRN
  Administered 2018-12-25: 10 mg via INTRAVENOUS

## 2018-12-25 MED ORDER — ONDANSETRON HCL 4 MG/2ML IJ SOLN
INTRAMUSCULAR | Status: DC | PRN
  Administered 2018-12-25: 4 mg via INTRAVENOUS

## 2018-12-25 MED ORDER — SCOPOLAMINE 1 MG/3DAYS TD PT72
1.0000 | MEDICATED_PATCH | Freq: Once | TRANSDERMAL | Status: DC
  Administered 2018-12-25: 1.5 mg via TRANSDERMAL

## 2018-12-25 MED ORDER — FENTANYL CITRATE (PF) 100 MCG/2ML IJ SOLN
INTRAMUSCULAR | Status: DC | PRN
  Administered 2018-12-25: 50 ug via INTRAVENOUS

## 2018-12-25 MED ORDER — LIDOCAINE HCL (CARDIAC) PF 100 MG/5ML IV SOSY
PREFILLED_SYRINGE | INTRAVENOUS | Status: DC | PRN
  Administered 2018-12-25: 40 mg via INTRAVENOUS

## 2018-12-25 MED ORDER — PHENYLEPHRINE HCL 0.5 % NA SOLN
NASAL | Status: DC | PRN
  Administered 2018-12-25: 30 mL via TOPICAL

## 2018-12-25 MED ORDER — OXYMETAZOLINE HCL 0.05 % NA SOLN
2.0000 | Freq: Once | NASAL | Status: AC
  Administered 2018-12-25: 2 via NASAL

## 2018-12-25 MED ORDER — GLYCOPYRROLATE 0.2 MG/ML IJ SOLN
INTRAMUSCULAR | Status: DC | PRN
  Administered 2018-12-25: 0.1 mg via INTRAVENOUS

## 2018-12-25 MED ORDER — MIDAZOLAM HCL 5 MG/5ML IJ SOLN
INTRAMUSCULAR | Status: DC | PRN
  Administered 2018-12-25: 2 mg via INTRAVENOUS

## 2018-12-25 MED ORDER — NON FORMULARY
2.0000 [IU] | Freq: Once | Status: AC
  Administered 2018-12-25: 2 [IU] via SUBCUTANEOUS

## 2018-12-25 MED ORDER — ACETAMINOPHEN 160 MG/5ML PO SOLN: 325.0000 mg | ORAL | Status: DC | PRN

## 2018-12-25 MED ORDER — PHENYLEPHRINE HCL (PRESSORS) 10 MG/ML IV SOLN
INTRAVENOUS | Status: DC | PRN
  Administered 2018-12-25: 50 ug via INTRAVENOUS
  Administered 2018-12-25 (×3): 100 ug via INTRAVENOUS
  Administered 2018-12-25: 50 ug via INTRAVENOUS
  Administered 2018-12-25 (×2): 100 ug via INTRAVENOUS

## 2018-12-25 MED ORDER — PROPOFOL 10 MG/ML IV BOLUS
INTRAVENOUS | Status: DC | PRN
  Administered 2018-12-25: 150 mg via INTRAVENOUS

## 2018-12-25 MED ORDER — ACETAMINOPHEN 10 MG/ML IV SOLN
1000.0000 mg | Freq: Once | INTRAVENOUS | Status: AC
  Administered 2018-12-25: 1000 mg via INTRAVENOUS

## 2018-12-25 MED ORDER — OXYCODONE HCL 5 MG PO TABS
5.0000 mg | ORAL_TABLET | Freq: Once | ORAL | Status: AC | PRN
  Administered 2018-12-25: 5 mg via ORAL

## 2018-12-25 SURGICAL SUPPLY — 28 items
CANISTER SUCT 1200ML W/VALVE (MISCELLANEOUS) ×3 IMPLANT
COAGULATOR SUCT 8FR VV (MISCELLANEOUS) ×3 IMPLANT
ELECT REM PT RETURN 9FT ADLT (ELECTROSURGICAL) ×3
ELECTRODE REM PT RTRN 9FT ADLT (ELECTROSURGICAL) ×1 IMPLANT
GLOVE PI ULTRA LF STRL 7.5 (GLOVE) ×2 IMPLANT
GLOVE PI ULTRA NON LATEX 7.5 (GLOVE) ×6
GOWN STRL REUS W/ TWL LRG LVL3 (GOWN DISPOSABLE) ×1 IMPLANT
GOWN STRL REUS W/TWL LRG LVL3 (GOWN DISPOSABLE) ×2
KIT TURNOVER KIT A (KITS) ×3 IMPLANT
NDL ANESTHESIA 27G X 3.5 (NEEDLE) ×1 IMPLANT
NDL HYPO 27GX1-1/4 (NEEDLE) ×1 IMPLANT
NEEDLE ANESTHESIA  27G X 3.5 (NEEDLE)
NEEDLE ANESTHESIA 27G X 3.5 (NEEDLE) IMPLANT
NEEDLE HYPO 27GX1-1/4 (NEEDLE) ×3 IMPLANT
PACK ENT CUSTOM (PACKS) ×3 IMPLANT
PATTIES SURGICAL .5 X3 (DISPOSABLE) ×3 IMPLANT
SOL ANTI-FOG 6CC FOG-OUT (MISCELLANEOUS) ×1 IMPLANT
SOL FOG-OUT ANTI-FOG 6CC (MISCELLANEOUS) ×2
SPLINT NASAL SEPTAL BLV .50 ST (MISCELLANEOUS) ×1 IMPLANT
STRAP BODY AND KNEE 60X3 (MISCELLANEOUS) ×3 IMPLANT
SUT CHROMIC 3-0 (SUTURE) ×2
SUT CHROMIC 3-0 KS 27XMFL CR (SUTURE) ×1
SUT ETHILON 3-0 KS 30 BLK (SUTURE) ×3 IMPLANT
SUT PLAIN GUT 4-0 (SUTURE) ×3 IMPLANT
SUTURE CHRMC 3-0 KS 27XMFL CR (SUTURE) ×1 IMPLANT
SYR 3ML LL SCALE MARK (SYRINGE) ×3 IMPLANT
TOWEL OR 17X26 4PK STRL BLUE (TOWEL DISPOSABLE) ×3 IMPLANT
WATER STERILE IRR 250ML POUR (IV SOLUTION) ×3 IMPLANT

## 2018-12-25 NOTE — Anesthesia Preprocedure Evaluation (Signed)
Anesthesia Evaluation  Patient identified by MRN, date of birth, ID band Patient awake    Reviewed: Allergy & Precautions, H&P , NPO status , Patient's Chart, lab work & pertinent test results  Airway Mallampati: III  TM Distance: <3 FB Neck ROM: full    Dental no notable dental hx.    Pulmonary asthma ,    Pulmonary exam normal breath sounds clear to auscultation       Cardiovascular hypertension, Normal cardiovascular exam Rhythm:regular Rate:Normal     Neuro/Psych  Neuromuscular disease    GI/Hepatic GERD  ,  Endo/Other  diabetes, Poorly Controlled  Renal/GU      Musculoskeletal   Abdominal   Peds  Hematology   Anesthesia Other Findings   Reproductive/Obstetrics                             Anesthesia Physical Anesthesia Plan  ASA: III  Anesthesia Plan: General ETT   Post-op Pain Management:    Induction:   PONV Risk Score and Plan: 3 and Midazolam, Scopolamine patch - Pre-op, Ondansetron and Treatment may vary due to age or medical condition  Airway Management Planned:   Additional Equipment:   Intra-op Plan:   Post-operative Plan:   Informed Consent: I have reviewed the patients History and Physical, chart, labs and discussed the procedure including the risks, benefits and alternatives for the proposed anesthesia with the patient or authorized representative who has indicated his/her understanding and acceptance.       Plan Discussed with: CRNA  Anesthesia Plan Comments:         Anesthesia Quick Evaluation

## 2018-12-25 NOTE — Anesthesia Postprocedure Evaluation (Signed)
Anesthesia Post Note  Patient: Terri Woods  Procedure(s) Performed: NASAL SEPTOPLASTY WITH INFERIOR TURBINATE REDUCTION (Bilateral Nose)  Patient location during evaluation: PACU Anesthesia Type: General Level of consciousness: awake and alert and oriented Pain management: satisfactory to patient Vital Signs Assessment: post-procedure vital signs reviewed and stable Respiratory status: spontaneous breathing, nonlabored ventilation and respiratory function stable Cardiovascular status: blood pressure returned to baseline and stable Postop Assessment: Adequate PO intake and No signs of nausea or vomiting Anesthetic complications: no    Raliegh Ip

## 2018-12-25 NOTE — Op Note (Signed)
12/25/2018  8:45 AM  [id number]   Pre-Op Dx:  Deviated Nasal Septum, Hypertrophic Inferior Turbinates  Post-op Dx: Same  Proc: Nasal Septoplasty, Bilateral Partial Reduction Inferior Turbinates   Surg:  Elon Alas Syvanna Ciolino  Anes:  GOT  EBL: 50 mL  Comp: None  Findings: Very large bony spur to the right side posteriorly impinging on the turbinates.  She had enlarged inferior turbinates.   Procedure: With the patient in a comfortable supine position,  general orotracheal anesthesia was induced without difficulty.     The patient received preoperative Afrin spray for topical decongestion and vasoconstriction.  Intravenous prophylactic antibiotics were administered.  At an appropriate level, the patient was placed in a semi-sitting position.  Nasal vibrissae were trimmed.   1% Xylocaine with 1:100,000 epinephrine, 6 cc's, was infiltrated into the anterior floor of the nose, into the nasal spine region, into the membranous columella, and finally into the submucoperichondrial plane of the septum on both sides.  Several minutes were allowed for this to take effect.  Cottoniod pledgetts soaked in Afrin and 4% Xylocaine were placed into both nasal cavities and left while the patient was prepped and draped in the standard fashion.  The materials were removed from the nose and observed to be intact and correct in number.  The nose was inspected with a headlight and zero degree scope with the findings as described above.  A left Killian incision was sharply executed and carried down to the quadrangular cartilage. The mucoperichondrium was elelvated along the quadrangular plate back to the bony-cartilaginous junction. The mucoperiostium was then elevated along the ethmoid plate and the vomer. The boney-catilaginous junction was then split with a freer elevator and the mucoperiosteum was elevated on the opposite side. The mucoperiosteum was then elevated along the maxillary crest as needed to expose the  crooked bone of the crest.  Boney spurs of the vomer and maxillary crest were removed with Donavan Foil forceps.  The cartilaginous plate was trimmed along its posterior and inferior borders of about 2 mm of cartilage to free it up inferiorly. Some of the deviated ethmoid plate was then fractured and removed with Takahashi forceps to free up the posterior border of the quadrangular plate and allow it to swing back to the midline. The mucosal flaps were placed back into their anatomic position to allow visualization of the airways. The septum now sat in the midline with an improved airway.  A 3-0 Chromic suture on a Keith needle in used to anchor the inferior septum at the nasal spine with a through and through suture. The mucosal flaps are then sutured together using a through and through whip stitch of 4-0 Plain Gut with a mini-Keith needle. This was used to close the Cornelius incision as well.   The inferior turbinates were then inspected. An incision was created along the inferior aspect of the left inferior turbinate with removal of some of the inferior soft tissue and bone. Electrocautery was used to control bleeding in the area. The remaining turbinate was then outfractured to open up the airway further. There was no significant bleeding noted. The right turbinate was then trimmed and outfractured in a similar fashion.  The airways were then visualized and showed open passageways on both sides that were significantly improved compared to before surgery. There was no signifcant bleeding. Nasal splints were applied to both sides of the septum using Xomed 0.80mm regular sized splints that were trimmed, and then held in position with a 3-0 Nylon through and  through suture.  The patient was turned back over to anesthesia, and awakened, extubated, and taken to the PACU in satisfactory condition.  Dispo:   PACU to home  Plan: Ice, elevation, narcotic analgesia, steroid taper, and prophylactic antibiotics for  the duration of indwelling nasal foreign bodies.  We will reevaluate the patient in the office in 6 days and remove the septal splints.  Return to work in 10 days, strenuous activities in two weeks. I did not write for any narcotics postop for her as she is on oxycodone daily for back pain.  This will work sufficiently for her and she does not need any further pain medication written from me.   Elon Alas Kaysen Deal 12/25/2018 8:45 AM

## 2018-12-25 NOTE — Transfer of Care (Signed)
Immediate Anesthesia Transfer of Care Note  Patient: Terri Woods  Procedure(s) Performed: NASAL SEPTOPLASTY WITH INFERIOR TURBINATE REDUCTION (Bilateral Nose)  Patient Location: PACU  Anesthesia Type: General ETT  Level of Consciousness: awake, alert  and patient cooperative  Airway and Oxygen Therapy: Patient Spontanous Breathing and Patient connected to supplemental oxygen  Post-op Assessment: Post-op Vital signs reviewed, Patient's Cardiovascular Status Stable, Respiratory Function Stable, Patent Airway and No signs of Nausea or vomiting  Post-op Vital Signs: Reviewed and stable  Complications: No apparent anesthesia complications

## 2018-12-25 NOTE — Anesthesia Procedure Notes (Signed)
Procedure Name: Intubation Date/Time: 12/25/2018 7:54 AM Performed by: Mayme Genta, CRNA Pre-anesthesia Checklist: Patient identified, Emergency Drugs available, Suction available, Patient being monitored and Timeout performed Patient Re-evaluated:Patient Re-evaluated prior to induction Oxygen Delivery Method: Circle system utilized Preoxygenation: Pre-oxygenation with 100% oxygen Induction Type: IV induction Ventilation: Mask ventilation without difficulty Laryngoscope Size: Miller and 2 Grade View: Grade I Tube type: Oral Rae Tube size: 7.0 mm Number of attempts: 1 Placement Confirmation: ETT inserted through vocal cords under direct vision,  positive ETCO2 and breath sounds checked- equal and bilateral Tube secured with: Tape Dental Injury: Teeth and Oropharynx as per pre-operative assessment

## 2018-12-25 NOTE — H&P (Signed)
H&P has been reviewed and patient reevaluated, no changes necessary. To be downloaded later.  

## 2019-01-28 ENCOUNTER — Other Ambulatory Visit: Payer: Self-pay

## 2019-01-28 ENCOUNTER — Encounter
Admission: RE | Admit: 2019-01-28 | Discharge: 2019-01-28 | Disposition: A | Discharge status: Home / Self Care | Payer: Medicare Other | Source: Ambulatory Visit | Attending: Orthopedic Surgery | Admitting: Orthopedic Surgery

## 2019-01-28 DIAGNOSIS — Z01818 Encounter for other preprocedural examination: Secondary | ICD-10-CM | POA: Insufficient documentation

## 2019-01-28 LAB — CBC
HCT: 38.1 % (ref 36.0–46.0)
Hemoglobin: 12.6 g/dL (ref 12.0–15.0)
MCH: 28.3 pg (ref 26.0–34.0)
MCHC: 33.1 g/dL (ref 30.0–36.0)
MCV: 85.4 fL (ref 80.0–100.0)
Platelets: 148 10*3/uL — ABNORMAL LOW (ref 150–400)
RBC: 4.46 MIL/uL (ref 3.87–5.11)
RDW: 14.3 % (ref 11.5–15.5)
WBC: 5.9 10*3/uL (ref 4.0–10.5)
nRBC: 0 % (ref 0.0–0.2)

## 2019-01-28 LAB — BASIC METABOLIC PANEL
Anion gap: 6 (ref 5–15)
BUN: 16 mg/dL (ref 6–20)
CO2: 26 mmol/L (ref 22–32)
Calcium: 9.1 mg/dL (ref 8.9–10.3)
Chloride: 103 mmol/L (ref 98–111)
Creatinine, Ser: 0.56 mg/dL (ref 0.44–1.00)
GFR calc Af Amer: >60 mL/min (ref >60)
GFR calc non Af Amer: >60 mL/min (ref >60)
Glucose, Bld: 287 mg/dL — ABNORMAL HIGH (ref 70–99)
Potassium: 3.8 mmol/L (ref 3.5–5.1)
Sodium: 135 mmol/L (ref 135–145)

## 2019-01-28 NOTE — Patient Instructions (Addendum)
Your procedure is scheduled on: 02-05-19 THURSDAY Report to Same Day Surgery 2nd floor medical mall [facility] [place] Entr[facility]-take elevator on left to 2nd floor.  Check in with surgery information desk.) To find out your arrival time please call [phone] between 1PM - 3PM on 02-04-19 [facility]  Remember: Instructions that are not followed completely may result in serious medical risk, up to and including death, or upon the discretion of your surgeon and anesthesiologist your surgery may need to be rescheduled.    _x___ 1. Do not eat food after midnight the night before your procedure. NO GUM OR CANDY AFTER MIDNIGHT. You may drink WATER up to 2 hours before you are scheduled to arrive at the hospital for your procedure.  Do not drink WATER within 2 hours of your scheduled arrival to the hospital.  Type 1 and type 2 diabetics should only drink water.   ____Ensure clear carbohydrate drink on the way to the hospital for bariatric patients  ____Ensure clear carbohydrate drink 3 hours before surgery.     __x__ 2. No Alcohol for 24 hours before or after surgery.   __x__3. No Smoking or e-cigarettes for 24 prior to surgery.  Do not use any chewable tobacco products for at least 6 hour prior to surgery   ____  4. Bring all medications with you on the day of surgery if instructed.    __x__ 5. Notify your doctor if there is any change in your medical condition     (cold, fever, infections).    x___6. On the morning of surgery brush your teeth with toothpaste and water.  You may rinse your mouth with mouth wash if you wish.  Do not swallow any toothpaste or mouthwash.   Do not wear jewelry, make-up, hairpins, clips or nail polish.  Do not wear lotions, powders, or perfumes.   Do not shave 48 hours prior to surgery. Men may shave face and neck.  Do not bring valuables to the hospital.    [facility] is not responsible for any belongings or valuables.               Contacts, dentures or  bridgework may not be worn into surgery.  Leave your suitcase in the car. After surgery it may be brought to your room.  For patients admitted to the hospital, discharge time is determined by your treatment team.  _  Patients discharged the day of surgery will not be allowed to drive home.  You will need someone to drive you home and stay with you the night of your procedure.    Please read over the following fact sheets that you were given:   [facility] Preparing for Surgery  _x___ TAKE THE FOLLOWING MEDICATION THE MORNING OF SURGERY WITH A SMALL SIP OF WATER. These include:  1. KLONOPIN (CLONAZEPAM)  2. PRILOSEC (OMEPRAZOLE)  3. OXYCODONE   4. TOPAMAX (TOPIRAMATE)  5.  6.  ____Fleets enema or Magnesium Citrate as directed.   _x___ Use CHG Soap or sage wipes as directed on instruction sheet   _X___ Use inhalers on the day of surgery and bring to hospital day of surgery-USE YOUR ALBUTEROL INHALER DAY OF SURGERY AND BRING ALBUTEROL [place]  ____ Stop Metformin and Janumet 2 days prior to surgery.    _X___ Take 1/2 of usual insulin dose the night before surgery and none on the morning surgery-TAKE HALF OF YOUR LANTUS Wednesday NIGHT (10 UNITS) AND NO INSULIN AM OF SURGERY  ____  Follow recommendations from Cardiologist, Pulmonologist or PCP regarding  stopping Aspirin, Coumadin, Plavix ,Eliquis, Effient, or Pradaxa, and Pletal.  X____Stop Anti-inflammatories such as Advil, Aleve, Ibuprofen, Motrin, Naproxen, Naprosyn, Goodies powders or aspirin products NOW-OK to take Tylenol    ____ Stop supplements until after surgery.     ____ Bring C-Pap to the hospital

## 2019-02-02 ENCOUNTER — Ambulatory Visit
Admission: EM | Admit: 2019-02-02 | Discharge: 2019-02-02 | Disposition: A | Discharge status: Home / Self Care | Payer: Medicare Other | Attending: Family Medicine | Admitting: Family Medicine

## 2019-02-02 ENCOUNTER — Other Ambulatory Visit: Payer: Self-pay

## 2019-02-02 ENCOUNTER — Other Ambulatory Visit
Admission: RE | Admit: 2019-02-02 | Discharge: 2019-02-02 | Disposition: A | Discharge status: Home / Self Care | Payer: Medicare Other | Source: Ambulatory Visit | Attending: Orthopedic Surgery | Admitting: Orthopedic Surgery

## 2019-02-02 DIAGNOSIS — Z01812 Encounter for preprocedural laboratory examination: Secondary | ICD-10-CM | POA: Insufficient documentation

## 2019-02-02 DIAGNOSIS — Z20828 Contact with and (suspected) exposure to other viral communicable diseases: Secondary | ICD-10-CM | POA: Diagnosis not present

## 2019-02-02 DIAGNOSIS — L0231 Cutaneous abscess of buttock: Secondary | ICD-10-CM | POA: Diagnosis not present

## 2019-02-02 LAB — SARS CORONAVIRUS 2 (TAT 6-24 HRS): SARS Coronavirus 2: NEGATIVE

## 2019-02-02 MED ORDER — SULFAMETHOXAZOLE-TRIMETHOPRIM 800-160 MG PO TABS: 1.0000 | ORAL_TABLET | Freq: Two times a day (BID) | ORAL | 0 refills | Status: DC

## 2019-02-02 NOTE — ED Provider Notes (Signed)
[facility]    CSN: [id number] Arrival date & time: 02/02/19  1144      History   Chief Complaint Chief Complaint  Patient presents with  . Abscess    HPI Terri Woods is a 48 y.o. female.   48 yo female with a c/o an "abscess" on her left butt cheek for the past 3 days. States area is painful and red. Denies any drainage, fevers, chills. Has been applying warm compresses.    Abscess   Past Medical History:  Diagnosis Date  . Arthritis    neck, hips, fingers  . Asthma   . Back pain   . Breast cancer ([place]) 2001   RT LUMPECTOMY PER PT  . Carpal tunnel syndrome    right arm  . Crohn's disease ([place])   . Diabetes ([place])   . Fibromyalgia   . GERD (gastroesophageal reflux disease)   . Glaucoma   . Headache   . Hypertension     There are no active problems to display for this patient.   Past Surgical History:  Procedure Laterality Date  . BREAST EXCISIONAL BIOPSY Right 15+ yrs ago   NEG  . BREAST SURGERY    . FOOT SURGERY Left   . LYMPH NODE BIOPSY    . NASAL SEPTOPLASTY W/ TURBINOPLASTY Bilateral 12/25/2018   Procedure: NASAL SEPTOPLASTY WITH INFERIOR TURBINATE REDUCTION;  Surgeon: Margaretha Sheffield, MD;  Location: [place];  Service: ENT;  Laterality: Bilateral;  . SHOULDER SURGERY    . SMALL INTESTINE SURGERY      OB History   No obstetric history on file.      Home Medications    Prior to Admission medications   Medication Sig Start Date End Date Taking? Authorizing Provider  albuterol (VENTOLIN HFA) 108 (90 Base) MCG/ACT inhaler Inhale 2 puffs into the lungs every 6 (six) hours as needed for wheezing or shortness of breath.   Yes [provider]  atorvastatin (LIPITOR) 20 MG tablet Take 20 mg by mouth daily at 6 PM.   Yes [provider]  bimatoprost (LUMIGAN) 0.01 % SOLN Place 1 drop into both eyes at bedtime.   Yes [provider]  clonazePAM (KLONOPIN) 1 MG tablet Take 1 mg by mouth 2 (two) times  daily.    Yes [provider]  cyclobenzaprine (FLEXERIL) 10 MG tablet Take 5 mg by mouth at bedtime. AND PRN   Yes [provider]  docusate sodium (COLACE) 100 MG capsule Take 100 mg by mouth 2 (two) times daily as needed for mild constipation.   Yes [provider]  DULoxetine (CYMBALTA) 60 MG capsule Take 60 mg by mouth at bedtime.   Yes [provider]  EPINEPHrine 0.3 mg/0.3 mL IJ SOAJ injection Inject 0.3 mg into the muscle as needed for anaphylaxis.    Yes [provider]  ergocalciferol (VITAMIN D2) 1.25 MG (50000 UT) capsule Take 50,000 Units by mouth See admin instructions. Take 1 capsule (50000 units) by mouth once a week or once every other week   Yes [provider]  glipiZIDE (GLUCOTROL) 5 MG tablet Take 10 mg by mouth 2 (two) times daily.   Yes [provider]  ibuprofen (ADVIL) 800 MG tablet Take 800 mg by mouth every 8 (eight) hours as needed (pain.).   Yes [provider]  insulin glargine (LANTUS) 100 UNIT/ML injection Inject 20 Units into the skin at bedtime.    Yes [provider]  ketoconazole (NIZORAL)  2 % shampoo Apply 1 application topically 2 (two) times a week.   Yes [provider]  lidocaine (XYLOCAINE) 5 % ointment Apply 1 application topically as needed (pain.).    Yes [provider]  lisinopril (ZESTRIL) 2.5 MG tablet Take 2.5 mg by mouth every evening.   Yes [provider]  miconazole (MICOTIN) 2 % cream Apply 1 application topically as needed (skin irritation/rash).    Yes [provider]  naloxone (NARCAN) nasal spray 4 mg/0.1 mL Place 1 spray into the nose as needed (accidental overdose).   Yes [provider]  omeprazole (PRILOSEC) 20 MG capsule Take 20 mg by mouth at bedtime.   Yes [provider]  Oxycodone HCl 10 MG TABS Take 10 mg by mouth 3 (three) times daily.   Yes [provider]  oxyCODONE-acetaminophen  (PERCOCET/ROXICET) 5-325 MG tablet Take 1 tablet by mouth every 4 (four) hours as needed (pain.).    Yes [provider]  QUEtiapine (SEROQUEL) 300 MG tablet Take 300 mg by mouth at bedtime.   Yes [provider]  SUMAtriptan (IMITREX) 100 MG tablet Take 100 mg by mouth every 2 (two) hours as needed for migraine. May repeat in 2 hours if headache persists or recurs.   Yes [provider]  topiramate (TOPAMAX) 100 MG tablet Take 100 mg by mouth 2 (two) times daily.   Yes [provider]  triamcinolone (NASACORT ALLERGY 24HR) 55 MCG/ACT AERO nasal inhaler Place 2 sprays into the nose daily.   Yes [provider]  triamcinolone cream (KENALOG) 0.1 % Apply 1 application topically 2 (two) times daily as needed (rash/skin irritation.).    Yes [provider]  brimonidine (ALPHAGAN) 0.2 % ophthalmic solution Place 2 drops into both eyes 2 (two) times daily.     [provider]  cephALEXin (KEFLEX) 500 MG capsule Take 1 capsule (500 mg total) by mouth 2 (two) times daily. Patient not taking: Reported on 01/26/2019 12/25/18   Margaretha Sheffield, MD  predniSONE (DELTASONE) 10 MG tablet Start with 3 pills tomorrow. Taper over the next 6 days.  3,3,2,2,1,1. Patient not taking: Reported on 01/26/2019 12/25/18   Margaretha Sheffield, MD  sulfamethoxazole-trimethoprim (BACTRIM DS) 800-160 MG tablet Take 1 tablet by mouth 2 (two) times daily. 02/02/19   Norval Gable, MD    Family History Family History  Problem Relation Age of Onset  . Heart attack Sister 72  . Heart disease Father   . Breast cancer Mother   . Breast cancer Maternal Aunt     Social History Social History   Tobacco Use  . Smoking status: Never Smoker  . Smokeless tobacco: Never Used  Substance Use Topics  . Alcohol use: No    Alcohol/week: 0.0 standard drinks  . Drug use: No     Allergies   Bee venom, Doxycycline, Erythromycin, and Shellfish allergy   Review of Systems Review of  Systems   Physical Exam Triage Vital Signs ED Triage Vitals  Enc Vitals Group     BP 02/02/19 1215 124/78     Pulse Rate 02/02/19 1215 94     Resp 02/02/19 1215 18     Temp 02/02/19 1215 98.1 F (36.7 C)     Temp Source 02/02/19 1215 Oral     SpO2 02/02/19 1215 99 %     Weight 02/02/19 1215 160 lb (72.6 kg)     Height 02/02/19 1215 5\' 1"  (1.549 m)     Head Circumference --  Peak Flow --      Pain Score 02/02/19 1213 9     Pain Loc --      Pain Edu? --      Excl. in [place]? --    No data found.  Updated Vital Signs BP 124/78 (BP Location: Left Arm)   Pulse 94   Temp 98.1 F (36.7 C) (Oral)   Resp 18   Ht 5\' 1"  (1.549 m)   Wt 72.6 kg   LMP 12/24/2018 Comment: preg test neg  SpO2 99%   BMI 30.23 kg/m   Visual Acuity Right Eye Distance:   Left Eye Distance:   Bilateral Distance:    Right Eye Near:   Left Eye Near:    Bilateral Near:     Physical Exam Vitals signs and nursing note reviewed.  Constitutional:      General: She is not in acute distress.    Appearance: She is not toxic-appearing or diaphoretic.  Skin:    Findings: Lesion (1cm purulent white head noted to left buttock with mild surrounding erythema and tenderness to palpation) present.  Neurological:     Mental Status: She is alert.      UC Treatments / Results  Labs (all labs ordered are listed, but only abnormal results are displayed) Labs Reviewed  AEROBIC/ANAEROBIC CULTURE (SURGICAL/DEEP WOUND)    EKG   Radiology No results found.  Procedures Incision and Drainage  Date/Time: 02/02/2019 12:44 PM Performed by: Norval Gable, MD Authorized by: Norval Gable, MD   Consent:    Consent obtained:  Verbal   Consent given by:  Patient   Risks discussed:  Bleeding, incomplete drainage, pain, infection and damage to other organs   Alternatives discussed:  No treatment Location:    Type:  Abscess   Size:  1cm   Location:  Anogenital   Anogenital location: left buttock.  Pre-procedure details:    Procedure prep: alcohol prep. Anesthesia (see MAR for exact dosages):    Anesthesia method:  None Procedure type:    Complexity:  Simple Procedure details:    Needle aspiration: no     Incision types:  Stab incision   Incision and drainage depth: superficial nick epidermis.   Scalpel blade:  11   Drainage:  Purulent   Drainage amount:  Scant   Packing materials:  None Post-procedure details:    Patient tolerance of procedure:  Tolerated well, no immediate complications   (including critical care time)  Medications Ordered in UC Medications - No data to display  Initial Impression / Assessment and Plan / UC Course  I have reviewed the triage vital signs and the nursing notes.  Pertinent labs & imaging results that were available during my care of the patient were reviewed by me and considered in my medical decision making (see chart for details).      Final Clinical Impressions(s) / UC Diagnoses   Final diagnoses:  Abscess of buttock, left     Discharge Instructions     Warm compresses to area Follow up as needed if symptoms worsen or don't improve    ED Prescriptions    Medication Sig Dispense Auth. Provider   sulfamethoxazole-trimethoprim (BACTRIM DS) 800-160 MG tablet Take 1 tablet by mouth 2 (two) times daily. 20 tablet Norval Gable, MD     1. diagnosis reviewed with patient 2. Procedure as per note above 3. rx as per orders above; reviewed possible side effects, interactions, risks and benefits  4. Recommend supportive treatment as above  5. Follow-up prn if symptoms worsen or don't improve   I have reviewed the PDMP during this encounter.   Norval Gable, MD 02/02/19 1249

## 2019-02-02 NOTE — Discharge Instructions (Addendum)
Warm compresses to area Follow up as needed if symptoms worsen or don't improve

## 2019-02-02 NOTE — ED Triage Notes (Signed)
Patient complains of an abscess near her rectum x 3 days.

## 2019-02-05 ENCOUNTER — Ambulatory Visit
Admission: RE | Admit: 2019-02-05 | Discharge: 2019-02-05 | Disposition: A | Discharge status: Home / Self Care | Payer: Medicare Other | Attending: Orthopedic Surgery | Admitting: Orthopedic Surgery

## 2019-02-05 ENCOUNTER — Encounter: Payer: Self-pay | Admitting: *Deleted

## 2019-02-05 ENCOUNTER — Ambulatory Visit: Payer: Medicare Other | Admitting: Certified Registered"

## 2019-02-05 ENCOUNTER — Encounter
Admission: RE | Disposition: A | Discharge status: Home / Self Care | Payer: Self-pay | Source: Home / Self Care | Attending: Orthopedic Surgery

## 2019-02-05 DIAGNOSIS — G5603 Carpal tunnel syndrome, bilateral upper limbs: Secondary | ICD-10-CM | POA: Insufficient documentation

## 2019-02-05 DIAGNOSIS — Z79899 Other long term (current) drug therapy: Secondary | ICD-10-CM | POA: Insufficient documentation

## 2019-02-05 DIAGNOSIS — I10 Essential (primary) hypertension: Secondary | ICD-10-CM | POA: Insufficient documentation

## 2019-02-05 DIAGNOSIS — K219 Gastro-esophageal reflux disease without esophagitis: Secondary | ICD-10-CM | POA: Diagnosis not present

## 2019-02-05 DIAGNOSIS — Z7951 Long term (current) use of inhaled steroids: Secondary | ICD-10-CM | POA: Diagnosis not present

## 2019-02-05 DIAGNOSIS — E119 Type 2 diabetes mellitus without complications: Secondary | ICD-10-CM | POA: Insufficient documentation

## 2019-02-05 DIAGNOSIS — Z794 Long term (current) use of insulin: Secondary | ICD-10-CM | POA: Diagnosis not present

## 2019-02-05 DIAGNOSIS — J45909 Unspecified asthma, uncomplicated: Secondary | ICD-10-CM | POA: Diagnosis not present

## 2019-02-05 HISTORY — PX: BILATERAL CARPAL TUNNEL RELEASE: SHX6508

## 2019-02-05 LAB — GLUCOSE, CAPILLARY
Glucose-Capillary: 227 mg/dL — ABNORMAL HIGH (ref 70–99)
Glucose-Capillary: 225 mg/dL — ABNORMAL HIGH (ref 70–99)

## 2019-02-05 LAB — POCT PREGNANCY, URINE: Preg Test, Ur: NEGATIVE

## 2019-02-05 SURGERY — BILATERAL CARPAL TUNNEL RELEASE
Anesthesia: General | Laterality: Bilateral

## 2019-02-05 MED ORDER — FENTANYL CITRATE (PF) 100 MCG/2ML IJ SOLN
INTRAMUSCULAR | Status: DC | PRN
  Administered 2019-02-05: 100 ug via INTRAVENOUS

## 2019-02-05 MED ORDER — FENTANYL CITRATE (PF) 100 MCG/2ML IJ SOLN
INTRAMUSCULAR | Status: AC
  Filled 2019-02-05: qty 2

## 2019-02-05 MED ORDER — SUCCINYLCHOLINE CHLORIDE 20 MG/ML IJ SOLN
INTRAMUSCULAR | Status: DC | PRN
  Administered 2019-02-05: 80 mg via INTRAVENOUS

## 2019-02-05 MED ORDER — SEVOFLURANE IN SOLN
RESPIRATORY_TRACT | Status: AC
  Filled 2019-02-05: qty 250

## 2019-02-05 MED ORDER — LIDOCAINE HCL (PF) 2 % IJ SOLN
INTRAMUSCULAR | Status: AC
  Filled 2019-02-05: qty 10

## 2019-02-05 MED ORDER — BUPIVACAINE HCL (PF) 0.5 % IJ SOLN
INTRAMUSCULAR | Status: AC
  Filled 2019-02-05: qty 30

## 2019-02-05 MED ORDER — LIDOCAINE HCL (CARDIAC) PF 100 MG/5ML IV SOSY
PREFILLED_SYRINGE | INTRAVENOUS | Status: DC | PRN
  Administered 2019-02-05: 80 mg via INTRAVENOUS

## 2019-02-05 MED ORDER — ONDANSETRON HCL 4 MG/2ML IJ SOLN
INTRAMUSCULAR | Status: DC | PRN
  Administered 2019-02-05: 4 mg via INTRAVENOUS

## 2019-02-05 MED ORDER — SODIUM CHLORIDE 0.9 % IV SOLN
INTRAVENOUS | Status: DC
  Administered 2019-02-05: 07:00:00 via INTRAVENOUS

## 2019-02-05 MED ORDER — OXYCODONE-ACETAMINOPHEN 7.5-325 MG PO TABS: 1.0000 | ORAL_TABLET | ORAL | 0 refills | Status: DC | PRN

## 2019-02-05 MED ORDER — FENTANYL CITRATE (PF) 100 MCG/2ML IJ SOLN: 25.0000 ug | INTRAMUSCULAR | Status: DC | PRN

## 2019-02-05 MED ORDER — PROPOFOL 10 MG/ML IV BOLUS
INTRAVENOUS | Status: DC | PRN
  Administered 2019-02-05: 50 mg via INTRAVENOUS
  Administered 2019-02-05: 150 mg via INTRAVENOUS

## 2019-02-05 MED ORDER — MIDAZOLAM HCL 2 MG/2ML IJ SOLN
INTRAMUSCULAR | Status: AC
  Filled 2019-02-05: qty 2

## 2019-02-05 MED ORDER — BUPIVACAINE HCL 0.5 % IJ SOLN
INTRAMUSCULAR | Status: DC | PRN
  Administered 2019-02-05: 20 mL

## 2019-02-05 MED ORDER — SUGAMMADEX SODIUM 200 MG/2ML IV SOLN
INTRAVENOUS | Status: DC | PRN
  Administered 2019-02-05: 100 mg via INTRAVENOUS

## 2019-02-05 MED ORDER — PROPOFOL 10 MG/ML IV BOLUS
INTRAVENOUS | Status: AC
  Filled 2019-02-05: qty 40

## 2019-02-05 MED ORDER — MIDAZOLAM HCL 2 MG/2ML IJ SOLN
INTRAMUSCULAR | Status: DC | PRN
  Administered 2019-02-05: 2 mg via INTRAVENOUS

## 2019-02-05 MED ORDER — ROCURONIUM BROMIDE 100 MG/10ML IV SOLN
INTRAVENOUS | Status: DC | PRN
  Administered 2019-02-05: 5 mg via INTRAVENOUS

## 2019-02-05 MED ORDER — ONDANSETRON HCL 4 MG/2ML IJ SOLN: 4.0000 mg | Freq: Once | INTRAMUSCULAR | Status: DC | PRN

## 2019-02-05 SURGICAL SUPPLY — 24 items
BNDG ELASTIC 3X5.8 VLCR STR LF (GAUZE/BANDAGES/DRESSINGS) ×3 IMPLANT
CANISTER SUCT 1200ML W/VALVE (MISCELLANEOUS) ×3 IMPLANT
CHLORAPREP W/TINT 26 (MISCELLANEOUS) ×3 IMPLANT
COVER WAND RF STERILE (DRAPES) ×3 IMPLANT
CUFF TOURN SGL QUICK 12 (TOURNIQUET CUFF) ×6 IMPLANT
CUFF TOURN SGL QUICK 18X4 (TOURNIQUET CUFF) IMPLANT
ELECT CAUTERY NEEDLE 2.0 MIC (NEEDLE) IMPLANT
GAUZE SPONGE 4X4 12PLY STRL (GAUZE/BANDAGES/DRESSINGS) ×3 IMPLANT
GAUZE XEROFORM 1X8 LF (GAUZE/BANDAGES/DRESSINGS) ×3 IMPLANT
GLOVE SURG SYN 9.0  PF PI (GLOVE) ×2
GLOVE SURG SYN 9.0 PF PI (GLOVE) ×1 IMPLANT
GOWN SRG 2XL LVL 4 RGLN SLV (GOWNS) ×1 IMPLANT
GOWN STRL NON-REIN 2XL LVL4 (GOWNS) ×2
GOWN STRL REUS W/ TWL LRG LVL3 (GOWN DISPOSABLE) ×1 IMPLANT
GOWN STRL REUS W/TWL LRG LVL3 (GOWN DISPOSABLE) ×2
KIT TURNOVER KIT A (KITS) ×3 IMPLANT
NS IRRIG 500ML POUR BTL (IV SOLUTION) ×3 IMPLANT
PACK EXTREMITY ARMC (MISCELLANEOUS) ×3 IMPLANT
PAD CAST CTTN 4X4 STRL (SOFTGOODS) ×1 IMPLANT
PADDING CAST COTTON 4X4 STRL (SOFTGOODS) ×2
SCALPEL PROTECTED #15 DISP (BLADE) ×6 IMPLANT
SUT ETHILON 4-0 (SUTURE) ×2
SUT ETHILON 4-0 FS2 18XMFL BLK (SUTURE) ×1
SUTURE ETHLN 4-0 FS2 18XMF BLK (SUTURE) ×1 IMPLANT

## 2019-02-05 NOTE — Anesthesia Post-op Follow-up Note (Signed)
Anesthesia QCDR form completed.        

## 2019-02-05 NOTE — H&P (Signed)
Reviewed paper H+P, will be scanned into chart. No changes noted.  

## 2019-02-05 NOTE — Discharge Instructions (Signed)
AMBULATORY SURGERY  DISCHARGE INSTRUCTIONS   1) The drugs that you were given will stay in your system until tomorrow so for the next 24 hours you should not:  A) Drive an automobile B) Make any legal decisions C) Drink any alcoholic beverage   2) You may resume regular meals tomorrow.  Today it is better to start with liquids and gradually work up to solid foods.  You may eat anything you prefer, but it is better to start with liquids, then soup and crackers, and gradually work up to solid foods.   3) Please notify your doctor immediately if you have any unusual bleeding, trouble breathing, redness and pain at the surgery site, drainage, fever, or pain not relieved by medication.    4) Additional Instructions:        Please contact your physician with any problems or Same Day Surgery at [phone], Monday through Friday 6 am to 4 pm, or [place] at [facility] number at [phone]   .Loosen Ace wrap prior to dismissal and over the weekend if fingers get puffy.  Work on finger motion is much as possible starting today.  Use hands as tolerated.  Numbing medicine was placed so do not be concerned about increased numbness today from that local anesthetic.  Additional pain medicine was prescribed few that is a little stronger than what you normally take and this should take care of your pain.  Take as directed.

## 2019-02-05 NOTE — Transfer of Care (Signed)
Immediate Anesthesia Transfer of Care Note  Patient: Terri Woods  Procedure(s) Performed: BILATERAL CARPAL TUNNEL RELEASE (Bilateral )  Patient Location: PACU  Anesthesia Type:General  Level of Consciousness: awake, alert  and oriented  Airway & Oxygen Therapy: Patient Spontanous Breathing and Patient connected to nasal cannula oxygen  Post-op Assessment: Report given to RN and Post -op Vital signs reviewed and stable  Post vital signs: Reviewed and stable  Last Vitals:  Vitals Value Taken Time  BP    Temp    Pulse    Resp    SpO2      Last Pain:  Vitals:   02/05/19 0635  TempSrc: Tympanic  PainSc: 0-No pain         Complications: No apparent anesthesia complications

## 2019-02-05 NOTE — Anesthesia Postprocedure Evaluation (Signed)
Anesthesia Post Note  Patient: Terri Woods  Procedure(s) Performed: BILATERAL CARPAL TUNNEL RELEASE (Bilateral )  Patient location during evaluation: PACU Anesthesia Type: General Level of consciousness: awake and alert Pain management: pain level controlled Vital Signs Assessment: post-procedure vital signs reviewed and stable Respiratory status: spontaneous breathing and respiratory function stable Cardiovascular status: stable Anesthetic complications: no     Last Vitals:  Vitals:   02/05/19 0857 02/05/19 0916  BP: 139/83 (!) 143/78  Pulse: 92 86  Resp: 18 16  Temp: (!) 36.2 C 36.7 C  SpO2: 95% 97%    Last Pain:  Vitals:   02/05/19 0916  TempSrc: Temporal  PainSc: 0-No pain                 [name]

## 2019-02-05 NOTE — Anesthesia Procedure Notes (Signed)
Procedure Name: Intubation Date/Time: 02/05/2019 7:29 AM Performed by: Chanetta Marshall, CRNA Pre-anesthesia Checklist: Patient identified, Emergency Drugs available, Suction available and Patient being monitored Patient Re-evaluated:Patient Re-evaluated prior to induction Oxygen Delivery Method: Circle system utilized Preoxygenation: Pre-oxygenation with 100% oxygen Induction Type: IV induction Laryngoscope Size: McGraph and 3 Grade View: Grade I Tube type: Oral Number of attempts: 1 Airway Equipment and Method: Stylet,  Oral airway and Video-laryngoscopy Placement Confirmation: ETT inserted through vocal cords under direct vision,  positive ETCO2,  breath sounds checked- equal and bilateral and CO2 detector Secured at: 21 cm Tube secured with: Tape Dental Injury: Teeth and Oropharynx as per pre-operative assessment

## 2019-02-05 NOTE — OR Nursing (Signed)
Patient able to move fingers bilaterally, cap refill brisk, warm to touch and pink.

## 2019-02-05 NOTE — Progress Notes (Signed)
Pt blood sugar 225 in PACU. Pt reports taking glipizide yesterday and 10units subq insulin last evening. Dr. Ronelle Nigh aware of BS reading. Per MD, no insulin at this time. Instruct pt to resume glipizide and PM insulin tonight.

## 2019-02-05 NOTE — Op Note (Signed)
02/05/2019  8:13 AM  PATIENT:  Terri Woods  48 y.o. female  PRE-OPERATIVE DIAGNOSIS:  BILATERAL CARPAL TUNNEL SYNDROME  POST-OPERATIVE DIAGNOSIS:  bilateral carpal tunnel syndrome  PROCEDURE:  Procedure(s): BILATERAL CARPAL TUNNEL RELEASE (Bilateral)  SURGEON: Laurene Footman, MD  ASSISTANTS: None  ANESTHESIA:   general  EBL:  Total I/O In: -  Out: 2 [Blood:2]  BLOOD ADMINISTERED:none  DRAINS: none   LOCAL MEDICATIONS USED:  MARCAINE     SPECIMEN:  No Specimen  DISPOSITION OF SPECIMEN:  N/A  COUNTS:  YES  TOURNIQUET:   Total Tourniquet Time Documented: Forearm (Right) - 11 minutes Forearm (Right) - 10 minutes Total: Forearm (Right) - 21 minutes   IMPLANTS: None  DICTATION: .Dragon Dictation patient was brought to the operating room and after adequate general anesthesia was obtained both arms were prepped and draped in the usual sterile fashion.  Forearm tourniquets were applied just below the elbow.  After patient identification and timeout procedures were completed the right hand was addressed first with approximately 2-2 and half centimeter incision in line with the ring metacarpal and along a palmar crease from the distal wrist flexion crease distally the skin and subcutaneous tissue were spread and there was a large aberrant thenar musculature that was over the entire transverse carpal ligament this was elevated and the transverse carpal ligament Opened with small incision followed by placement of a vascular hemostat to protect the underlying structures release was carried out distally until fat was noted around the nerve and then proximally about a centimeter proximal to the wrist flexion crease there was an area of compression and after release there is good vascular blush to the nerve the wound was irrigated and then infiltrated with 10 cc of half percent Sensorcaine without epinephrine and the wound closed with simple erupted 4-0 nylon skin suture dressed with  Xeroform 4 x 4 web roll and Ace wrap and tourniquet let down.  Going to the left arm think nearly identical procedure carried out except there was less of the thenar musculature overlying the transcarpal ligament and the compression was slightly more proximal and so the incision was extended a few millimeters proximal to the distal wrist flexion crease again there is good vascular blush there were no masses present in either canal with mild flexor tenosynovitis present bilaterally.  The local anesthetic was infiltrated as in the right hand and identical closure and dressing applied.  PLAN OF CARE: Discharge to home after PACU  PATIENT DISPOSITION:  PACU - hemodynamically stable.

## 2019-02-05 NOTE — Anesthesia Preprocedure Evaluation (Signed)
Anesthesia Evaluation  Patient identified by MRN, date of birth, ID band Patient awake    Reviewed: Allergy & Precautions, NPO status , Patient's Chart, lab work & pertinent test results  History of Anesthesia Complications Negative for: history of anesthetic complications  Airway Mallampati: III       Dental   Pulmonary neg shortness of breath, asthma , neg sleep apnea, neg COPD, Not current smoker,           Cardiovascular hypertension, Pt. on medications (-) Past MI and (-) CHF (-) dysrhythmias + Valvular Problems/Murmurs (murmur, no tx)      Neuro/Psych neg Seizures    GI/Hepatic Neg liver ROS, GERD  Medicated and Controlled,  Endo/Other  diabetes, Type 2, Oral Hypoglycemic Agents, Insulin Dependent  Renal/GU negative Renal ROS     Musculoskeletal   Abdominal   Peds  Hematology   Anesthesia Other Findings   Reproductive/Obstetrics                             Anesthesia Physical Anesthesia Plan  ASA: III  Anesthesia Plan: General   Post-op Pain Management:    Induction: Intravenous  PONV Risk Score and Plan: Ondansetron, Dexamethasone and Midazolam  Airway Management Planned: LMA  Additional Equipment:   Intra-op Plan:   Post-operative Plan:   Informed Consent: I have reviewed the patients History and Physical, chart, labs and discussed the procedure including the risks, benefits and alternatives for the proposed anesthesia with the patient or authorized representative who has indicated his/her understanding and acceptance.       Plan Discussed with:   Anesthesia Plan Comments:         Anesthesia Quick Evaluation

## 2019-02-06 ENCOUNTER — Encounter: Payer: Self-pay | Admitting: Orthopedic Surgery

## 2019-02-08 LAB — AEROBIC/ANAEROBIC CULTURE W GRAM STAIN (SURGICAL/DEEP WOUND): Special Requests: NORMAL

## 2019-02-09 ENCOUNTER — Telehealth (HOSPITAL_COMMUNITY): Payer: Self-pay | Admitting: Emergency Medicine

## 2019-02-09 NOTE — Telephone Encounter (Signed)
Called patient to see how she was feeling, pt states she had stopped taking the bactrim because it made her sick. Pt states she feels like the wound is healing. Pt given options for treatment, pt decided to monitor symptoms and follow up if needed. All questions answered.

## 2019-02-11 ENCOUNTER — Encounter: Payer: Self-pay | Admitting: Orthopedic Surgery

## 2019-05-20 ENCOUNTER — Other Ambulatory Visit: Payer: Self-pay | Admitting: Specialist

## 2019-05-20 DIAGNOSIS — IMO0002 Reserved for concepts with insufficient information to code with codable children: Secondary | ICD-10-CM

## 2019-05-20 DIAGNOSIS — G43709 Chronic migraine without aura, not intractable, without status migrainosus: Secondary | ICD-10-CM

## 2019-06-03 IMAGING — MR MR CERVICAL SPINE W/O CM
5 series · 35 of 48 positions shown · non-contrast
Comparison: CT of the cervical spine 08/12/2014.

CLINICAL DATA: Cervical radiculopathy. Neck and RIGHT arm pain.
Fell off horse [DATE].

EXAM:
MRI CERVICAL SPINE WITHOUT CONTRAST
TECHNIQUE: Multiplanar, multisequence MR imaging of the cervical spine was
performed. No intravenous contrast was administered.

[Series 3: T2 · sagittal · 3.0mm · 0.70mm/px · 7 of 15 slices shown (1 of 2)]
[im 1/15]
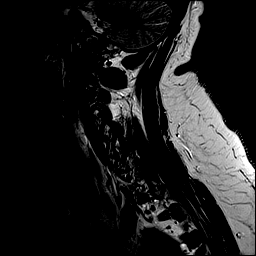
[im 3/15]
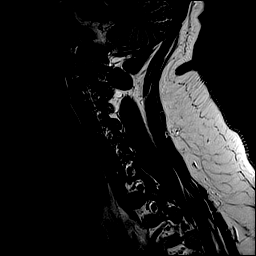
[im 5/15]
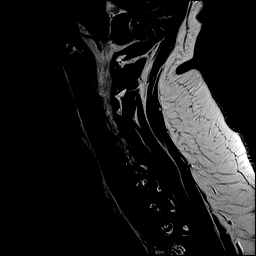
[im 8/15]
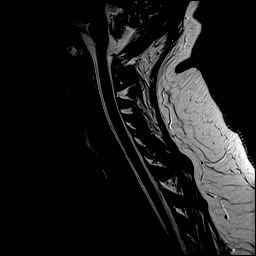
[im 10/15]
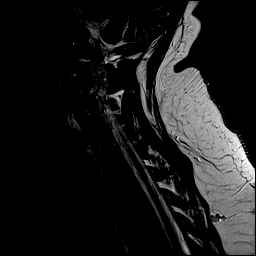
[im 12/15]
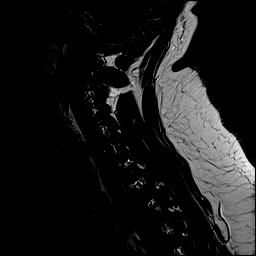
[im 15/15]
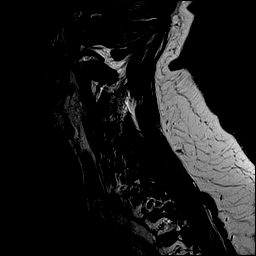

[Series 4: T1 · sagittal · 3.0mm · 0.70mm/px · 7 of 15 slices shown]
[im 1/15]
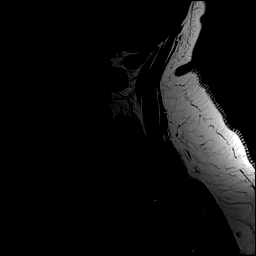
[im 3/15]
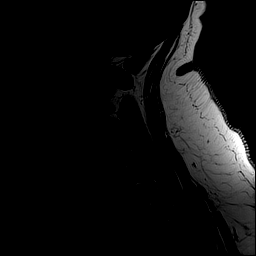
[im 5/15]
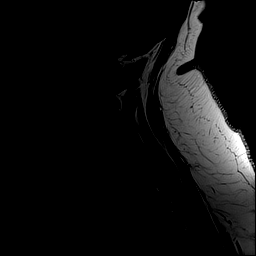
[im 8/15]
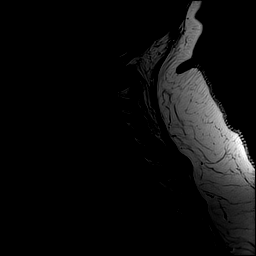
[im 10/15]
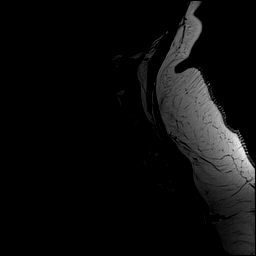
[im 12/15]
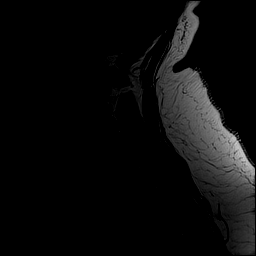
[im 15/15]
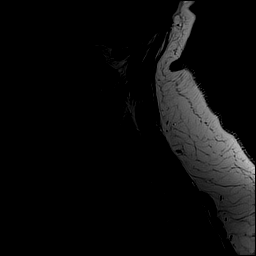

[Series 5: STIR · sagittal · 3.0mm · 0.35mm/px · 8 of 15 slices shown]
[im 1/15]
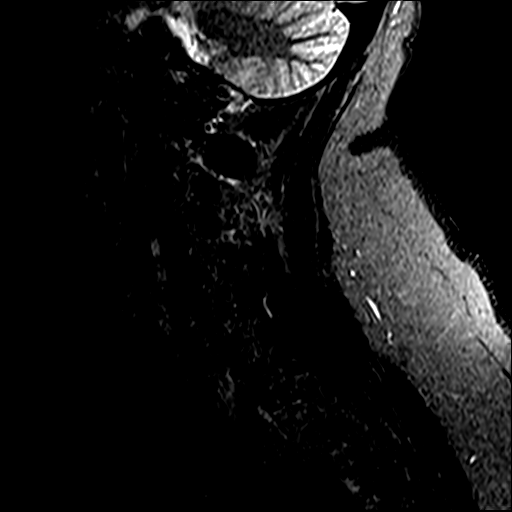
[im 3/15]
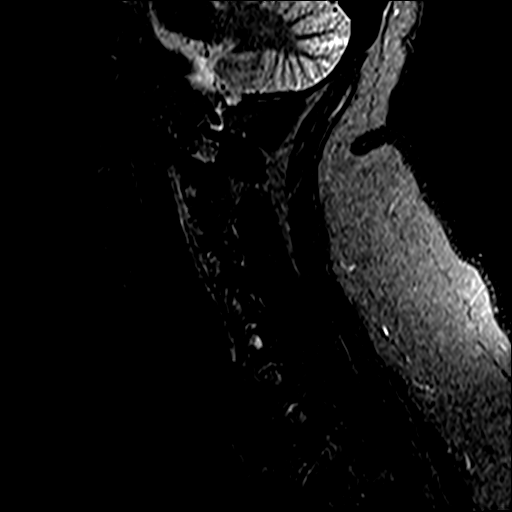
[im 5/15]
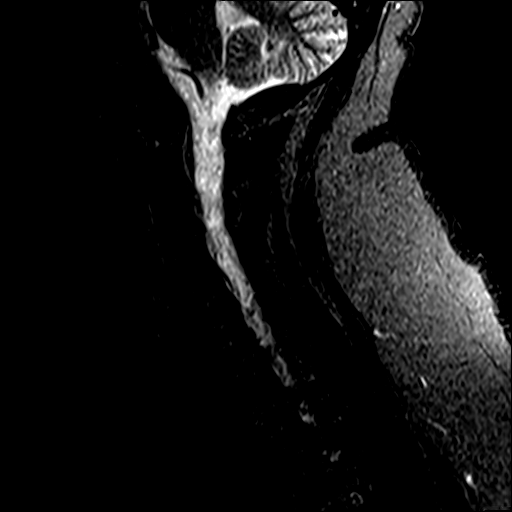
[im 7/15]
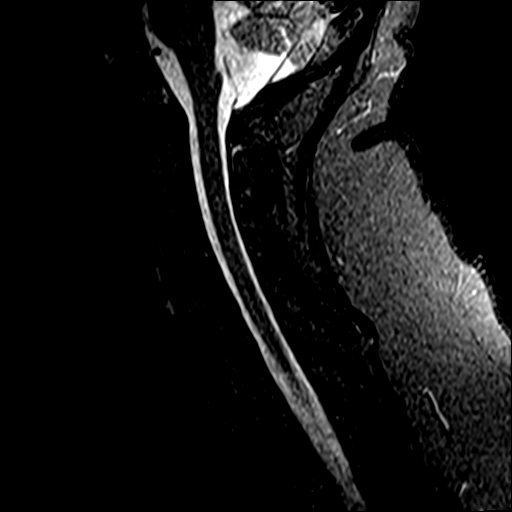
[im 9/15]
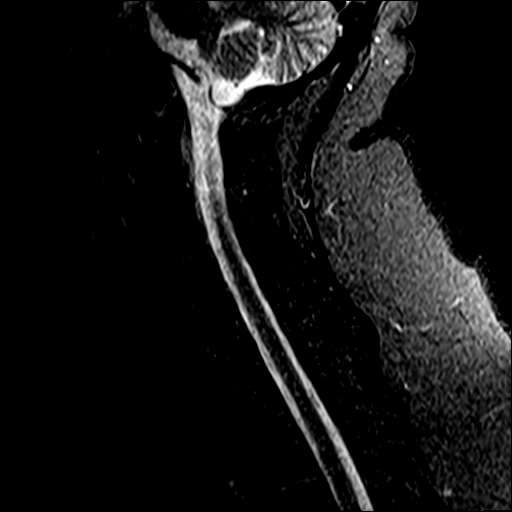
[im 11/15]
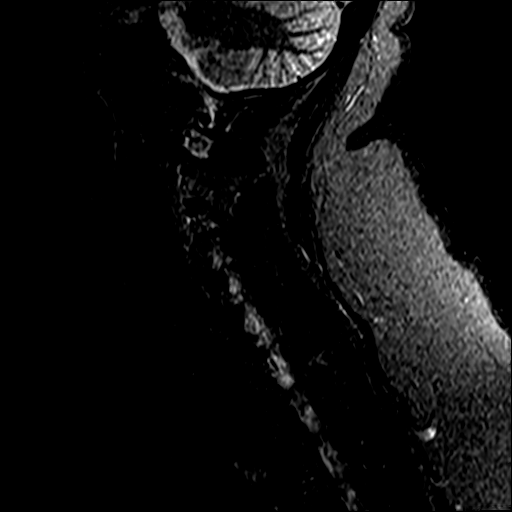
[im 13/15]
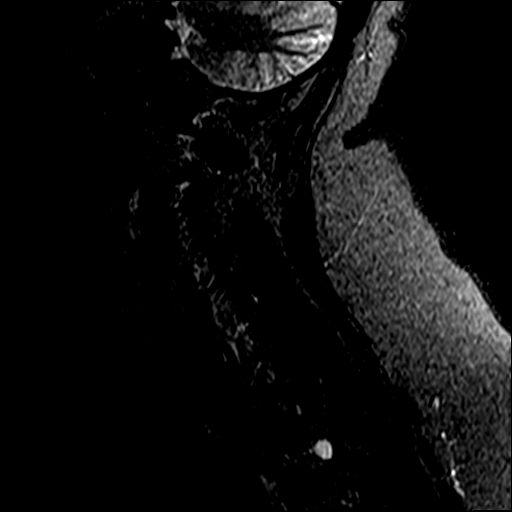
[im 15/15]
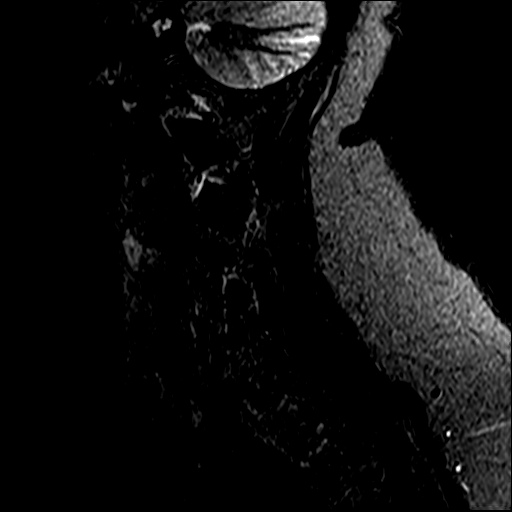

[Series 6: T2 · axial · 3.0mm · 0.70mm/px · z∈[-40,+48]mm · 9 of 25 slices shown (2 of 2)]
[im 1/25]
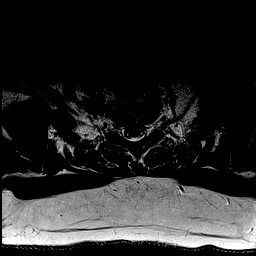
[im 5/25]
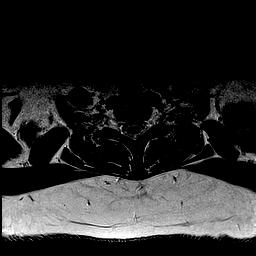
[im 9/25]
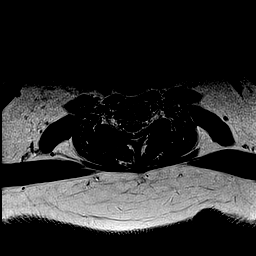
[im 11/25]
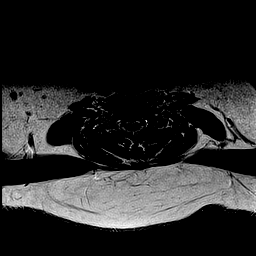
[im 13/25]
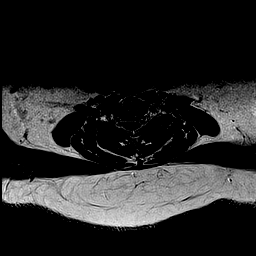
[im 15/25]
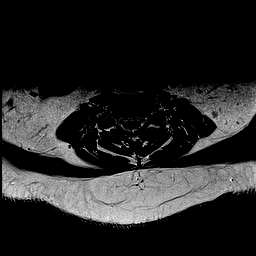
[im 17/25]
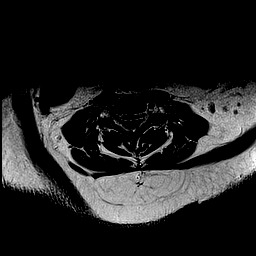
[im 21/25]
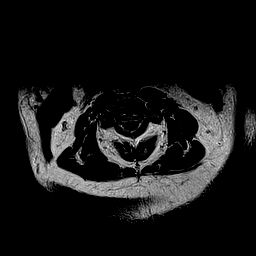
[im 25/25]
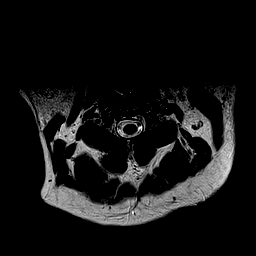

[Series 7: mpgr ax · axial · 3.0mm · 0.35mm/px · z∈[-40,-3]mm · 4 of 25 slices shown]
[im 1/25]
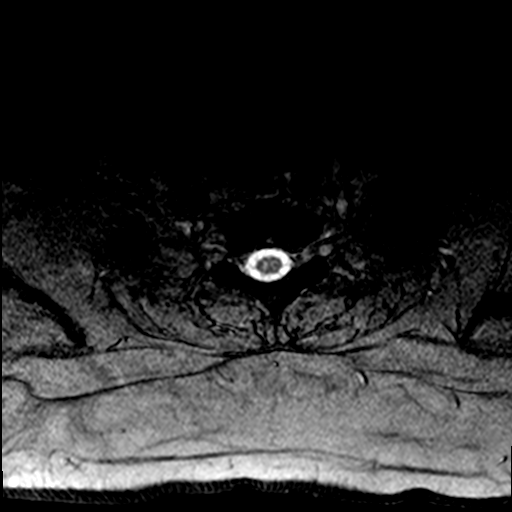
[im 5/25]
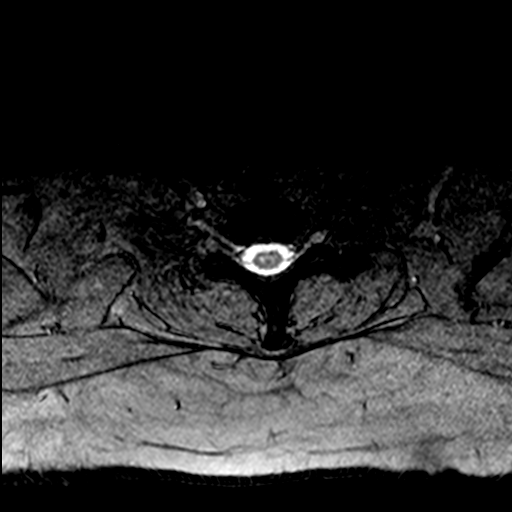
[im 9/25]
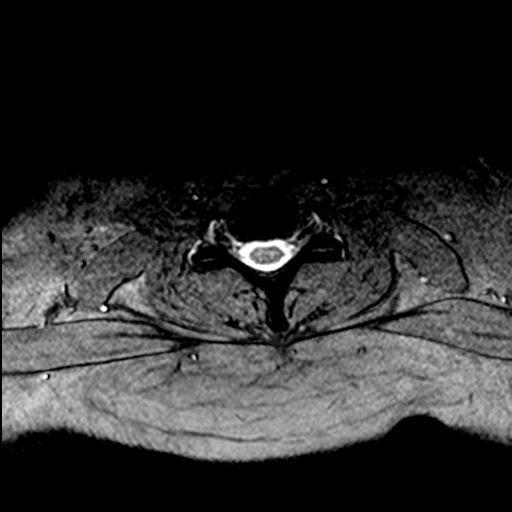
[im 11/25]
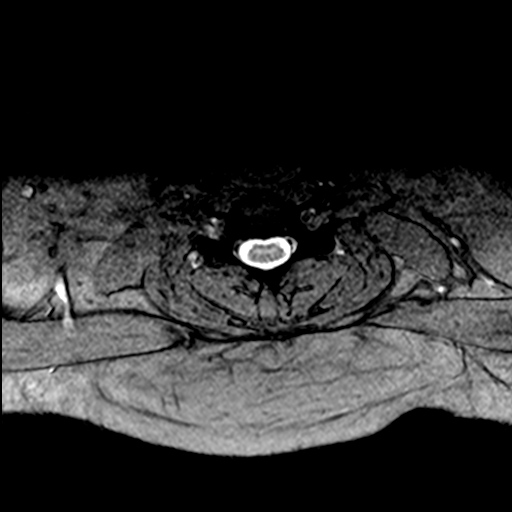

[35 of 48 positions shown; findings below may reference images not displayed]

FINDINGS: Alignment: Straightening of the normal cervical lordosis. No
subluxation.

Vertebrae: No fracture, evidence of discitis, or bone lesion.

Cord: Normal signal and morphology.

Posterior Fossa, vertebral arteries, paraspinal tissues: Negative.

Disc levels:

No disc protrusion or spinal stenosis. No foraminal narrowing. No
evidence for facet arthropathy.

Correlating with the previous CT cervical spine, there is good
general agreement.
IMPRESSION: Negative exam. No disc protrusion or spinal stenosis. No occult
osseous or ligamentous injury is observed. No cause of the reported
symptoms can be identified.

## 2019-08-31 ENCOUNTER — Other Ambulatory Visit: Payer: Self-pay

## 2019-08-31 ENCOUNTER — Ambulatory Visit
Admission: EM | Admit: 2019-08-31 | Discharge: 2019-08-31 | Disposition: A | Discharge status: Home / Self Care | Payer: Medicare Other | Attending: Family Medicine | Admitting: Family Medicine

## 2019-08-31 DIAGNOSIS — H1013 Acute atopic conjunctivitis, bilateral: Secondary | ICD-10-CM

## 2019-08-31 DIAGNOSIS — L239 Allergic contact dermatitis, unspecified cause: Secondary | ICD-10-CM | POA: Diagnosis not present

## 2019-08-31 MED ORDER — PREDNISONE 20 MG PO TABS: 20.0000 mg | ORAL_TABLET | Freq: Every day | ORAL | 0 refills | Status: DC

## 2019-08-31 MED ORDER — TRIAMCINOLONE ACETONIDE 0.1 % EX CREA: 1.0000 "application " | TOPICAL_CREAM | Freq: Two times a day (BID) | CUTANEOUS | 0 refills | Status: DC

## 2019-08-31 NOTE — ED Provider Notes (Signed)
[facility]    CSN: [id number] Arrival date & time: 08/31/19  0847      History   Chief Complaint Chief Complaint  Patient presents with  . Eye Problem  . Rash    HPI Terri Woods is a 49 y.o. female.   49 yo female with a c/o itchy rash on her body and red, itchy, burning eyes (bilateral) for the past 3 days. States symptoms started after working in her yard around [place]. Has tried otc products without relief. States she also has seasonal allergies and takes zyrtec and benadryl. Denies any fevers, chills, chest pains, shortness of breath, swelling, wheezing, eye drainage or injury.    Eye Problem Rash   Past Medical History:  Diagnosis Date  . Arthritis    neck, hips, fingers  . Asthma   . Back pain   . Breast cancer ([place]) 2001   RT LUMPECTOMY PER PT  . Carpal tunnel syndrome    right arm  . Crohn's disease ([place])   . Diabetes ([employer])   . Fibromyalgia   . GERD (gastroesophageal reflux disease)   . Glaucoma   . Headache   . Hypertension     There are no problems to display for this patient.   Past Surgical History:  Procedure Laterality Date  . BILATERAL CARPAL TUNNEL RELEASE Bilateral 02/05/2019   Procedure: BILATERAL CARPAL TUNNEL RELEASE;  Surgeon: Hessie Knows, MD;  Location: [facility];  Service: Orthopedics;  Laterality: Bilateral;  . BREAST EXCISIONAL BIOPSY Right 15+ yrs ago   NEG  . BREAST SURGERY    . FOOT SURGERY Left   . LYMPH NODE BIOPSY    . NASAL SEPTOPLASTY W/ TURBINOPLASTY Bilateral 12/25/2018   Procedure: NASAL SEPTOPLASTY WITH INFERIOR TURBINATE REDUCTION;  Surgeon: Margaretha Sheffield, MD;  Location: [place];  Service: ENT;  Laterality: Bilateral;  . SHOULDER SURGERY    . SMALL INTESTINE SURGERY      OB History   No obstetric history on file.      Home Medications    Prior to Admission medications   Medication Sig Start Date End Date Taking? Authorizing Provider  albuterol (VENTOLIN HFA) 108 (90 Base)  MCG/ACT inhaler Inhale 2 puffs into the lungs every 6 (six) hours as needed for wheezing or shortness of breath.    [provider]  atorvastatin (LIPITOR) 20 MG tablet Take 20 mg by mouth daily at 6 PM.    [provider]  bimatoprost (LUMIGAN) 0.01 % SOLN Place 1 drop into both eyes at bedtime.    [provider]  brimonidine (ALPHAGAN) 0.2 % ophthalmic solution Place 2 drops into both eyes 2 (two) times daily.     [provider]  clonazePAM (KLONOPIN) 1 MG tablet Take 1 mg by mouth 2 (two) times daily.     [provider]  cyclobenzaprine (FLEXERIL) 10 MG tablet Take 5 mg by mouth at bedtime. AND PRN    [provider]  docusate sodium (COLACE) 100 MG capsule Take 100 mg by mouth 2 (two) times daily as needed for mild constipation.    [provider]  DULoxetine (CYMBALTA) 60 MG capsule Take 60 mg by mouth at bedtime.    [provider]  EPINEPHrine 0.3 mg/0.3 mL IJ SOAJ injection Inject 0.3 mg into the muscle as needed for anaphylaxis.     [provider]  ergocalciferol (VITAMIN D2) 1.25 MG (50000 UT) capsule Take 50,000 Units by mouth See admin instructions. Take  1 capsule (50000 units) by mouth once a week or once every other week    [provider]  glipiZIDE (GLUCOTROL) 5 MG tablet Take 10 mg by mouth 2 (two) times daily.    [provider]  ibuprofen (ADVIL) 800 MG tablet Take 800 mg by mouth every 8 (eight) hours as needed (pain.).    [provider]  insulin glargine (LANTUS) 100 UNIT/ML injection Inject 20 Units into the skin at bedtime.     [provider]  ketoconazole (NIZORAL) 2 % shampoo Apply 1 application topically 2 (two) times a week.    [provider]  lidocaine (XYLOCAINE) 5 % ointment Apply 1 application topically as needed (pain.).     [provider]  lisinopril (ZESTRIL) 2.5 MG tablet Take 2.5 mg by mouth every evening.    [provider]  miconazole (MICOTIN) 2 % cream Apply 1 application topically as needed (skin irritation/rash).     [provider]  naloxone [facility]) nasal spray 4 mg/0.1 mL Place 1 spray into the nose as needed (accidental overdose).    [provider]  omeprazole (PRILOSEC) 20 MG capsule Take 20 mg by mouth at bedtime.    [provider]  Oxycodone HCl 10 MG TABS Take 10 mg by mouth 3 (three) times daily.    [provider]  oxyCODONE-acetaminophen (PERCOCET) 7.5-325 MG tablet Take 1 tablet by mouth every 4 (four) hours as needed for severe pain. 02/05/19 02/05/20  Hessie Knows, MD  oxyCODONE-acetaminophen (PERCOCET/ROXICET) 5-325 MG tablet Take 1 tablet by mouth every 4 (four) hours as needed (pain.).     [provider]  predniSONE (DELTASONE) 20 MG tablet Take 1 tablet (20 mg total) by mouth daily. 08/31/19   Norval Gable, MD  QUEtiapine (SEROQUEL) 300 MG tablet Take 300 mg by mouth at bedtime.    [provider]  sulfamethoxazole-trimethoprim (BACTRIM DS) 800-160 MG tablet Take 1 tablet by mouth 2 (two) times daily. 02/02/19   Norval Gable, MD  SUMAtriptan (IMITREX) 100 MG tablet Take 100 mg by mouth every 2 (two) hours as needed for migraine. May repeat in 2 hours if headache persists or recurs.    [provider]  topiramate (TOPAMAX) 100 MG tablet Take 100 mg by mouth 2 (two) times daily.    [provider]  triamcinolone (NASACORT ALLERGY 24HR) 55 MCG/ACT AERO nasal inhaler Place 2 sprays into the nose daily.    [provider]  triamcinolone cream (KENALOG) 0.1 % Apply 1 application topically 2 (two) times daily. 08/31/19   Norval Gable, MD    Family History Family History  Problem Relation Age of Onset  . Heart attack Sister 40  . Heart disease Father   . Breast cancer Mother   . Breast cancer Maternal Aunt     Social History Social History   Tobacco Use  . Smoking status: Former Research scientist (life sciences)  .  Smokeless tobacco: Never Used  Substance Use Topics  . Alcohol use: No    Alcohol/week: 0.0 standard drinks  . Drug use: No     Allergies   Bee venom, Doxycycline, Erythromycin, and Shellfish allergy   Review of Systems Review of Systems  Skin: Positive for rash.     Physical Exam Triage Vital Signs ED Triage Vitals  Enc Vitals Group     BP 08/31/19 0908 (!) (P) 145/84     Pulse Rate 08/31/19 0908 (P) 100     Resp 08/31/19 0908 (P) 17  Temp 08/31/19 0908 (P) 97.8 F (36.6 C)     Temp Source 08/31/19 0908 (P) Oral     SpO2 08/31/19 0908 (P) 98 %     Weight 08/31/19 0902 160 lb (72.6 kg)     Height 08/31/19 0902 5\' 1"  (1.549 m)     Head Circumference --      Peak Flow --      Pain Score 08/31/19 0901 9     Pain Loc --      Pain Edu? --      Excl. in [place]? --    No data found.  Updated Vital Signs BP (!) (P) 145/84 (BP Location: Left Arm)   Pulse (P) 100   Temp (P) 97.8 F (36.6 C) (Oral)   Resp (P) 17   Ht 5\' 1"  (1.549 m)   Wt 72.6 kg   LMP 12/24/2018 Comment: preg test neg  SpO2 (P) 98%   BMI 30.23 kg/m   Visual Acuity Right Eye Distance:   Left Eye Distance:   Bilateral Distance:    Right Eye Near:   Left Eye Near:    Bilateral Near:     Physical Exam Vitals and nursing note reviewed.  Constitutional:      General: She is not in acute distress.    Appearance: Normal appearance. She is not toxic-appearing or diaphoretic.  HENT:     Nose: No congestion or rhinorrhea.     Mouth/Throat:     Pharynx: Oropharynx is clear.  Eyes:     General: Lids are normal. Vision grossly intact.        Right eye: No discharge.        Left eye: No discharge.     Extraocular Movements: Extraocular movements intact.     Conjunctiva/sclera:     Right eye: Right conjunctiva is injected. No exudate.    Left eye: Left conjunctiva is injected. No exudate.    Pupils: Pupils are equal, round, and reactive to light.  Pulmonary:     Effort: Pulmonary effort is normal.  No respiratory distress.     Breath sounds: Normal breath sounds.  Skin:    Findings: Rash present.     Comments: Papulovesicular erythematous rash on legs and arms  Neurological:     Mental Status: She is alert.      UC Treatments / Results  Labs (all labs ordered are listed, but only abnormal results are displayed) Labs Reviewed - No data to display  EKG   Radiology No results found.  Procedures Procedures (including critical care time)  Medications Ordered in UC Medications - No data to display  Initial Impression / Assessment and Plan / UC Course  I have reviewed the triage vital signs and the nursing notes.  Pertinent labs & imaging results that were available during my care of the patient were reviewed by me and considered in my medical decision making (see chart for details).      Final Clinical Impressions(s) / UC Diagnoses   Final diagnoses:  Allergic conjunctivitis of both eyes  Allergic contact dermatitis, unspecified trigger   Discharge Instructions   None    ED Prescriptions    Medication Sig Dispense Auth. Provider   predniSONE (DELTASONE) 20 MG tablet Take 1 tablet (20 mg total) by mouth daily. 7 tablet Hiral Lukasiewicz, Linward Foster, MD   triamcinolone cream (KENALOG) 0.1 % Apply 1 application topically 2 (two) times daily. 30 g Norval Gable, MD      1. diagnosis reviewed  with patient 2. rx as per orders above; reviewed possible side effects, interactions, risks and benefits  3. Recommend supportive treatment continuing allergy medications 4. Follow-up prn if symptoms worsen or don't improve   PDMP not reviewed this encounter.   Norval Gable, MD 08/31/19 1012

## 2019-08-31 NOTE — ED Triage Notes (Addendum)
Pt states both her eyes started burning on Friday and then awoke on Saturday with a rash "all over her body." Very itchy.  VISUAL ACUITY: Left eye uncorrected 20/40 Right eye uncorrected 20/50 Both eyes uncorrected 20/30

## 2019-10-20 ENCOUNTER — Ambulatory Visit
Admission: EM | Admit: 2019-10-20 | Discharge: 2019-10-20 | Disposition: A | Discharge status: Home / Self Care | Payer: Medicare Other | Attending: Emergency Medicine | Admitting: Emergency Medicine

## 2019-10-20 ENCOUNTER — Encounter: Payer: Self-pay | Admitting: Emergency Medicine

## 2019-10-20 ENCOUNTER — Other Ambulatory Visit: Payer: Self-pay

## 2019-10-20 DIAGNOSIS — Z803 Family history of malignant neoplasm of breast: Secondary | ICD-10-CM | POA: Diagnosis not present

## 2019-10-20 DIAGNOSIS — K219 Gastro-esophageal reflux disease without esophagitis: Secondary | ICD-10-CM | POA: Insufficient documentation

## 2019-10-20 DIAGNOSIS — J45909 Unspecified asthma, uncomplicated: Secondary | ICD-10-CM | POA: Diagnosis not present

## 2019-10-20 DIAGNOSIS — L03116 Cellulitis of left lower limb: Secondary | ICD-10-CM | POA: Insufficient documentation

## 2019-10-20 DIAGNOSIS — R110 Nausea: Secondary | ICD-10-CM | POA: Diagnosis not present

## 2019-10-20 DIAGNOSIS — Z8249 Family history of ischemic heart disease and other diseases of the circulatory system: Secondary | ICD-10-CM | POA: Diagnosis not present

## 2019-10-20 DIAGNOSIS — I10 Essential (primary) hypertension: Secondary | ICD-10-CM | POA: Insufficient documentation

## 2019-10-20 DIAGNOSIS — Z794 Long term (current) use of insulin: Secondary | ICD-10-CM | POA: Insufficient documentation

## 2019-10-20 DIAGNOSIS — R42 Dizziness and giddiness: Secondary | ICD-10-CM | POA: Insufficient documentation

## 2019-10-20 DIAGNOSIS — K5090 Crohn's disease, unspecified, without complications: Secondary | ICD-10-CM | POA: Diagnosis not present

## 2019-10-20 DIAGNOSIS — M797 Fibromyalgia: Secondary | ICD-10-CM | POA: Diagnosis not present

## 2019-10-20 DIAGNOSIS — Z79899 Other long term (current) drug therapy: Secondary | ICD-10-CM | POA: Insufficient documentation

## 2019-10-20 DIAGNOSIS — Z20822 Contact with and (suspected) exposure to covid-19: Secondary | ICD-10-CM | POA: Insufficient documentation

## 2019-10-20 DIAGNOSIS — R079 Chest pain, unspecified: Secondary | ICD-10-CM | POA: Diagnosis not present

## 2019-10-20 DIAGNOSIS — E119 Type 2 diabetes mellitus without complications: Secondary | ICD-10-CM | POA: Diagnosis not present

## 2019-10-20 LAB — GLUCOSE, CAPILLARY: Glucose-Capillary: 198 mg/dL — ABNORMAL HIGH (ref 70–99)

## 2019-10-20 LAB — SARS CORONAVIRUS 2 (TAT 6-24 HRS): SARS Coronavirus 2: NEGATIVE

## 2019-10-20 MED ORDER — ONDANSETRON 8 MG PO TBDP
8.0000 mg | ORAL_TABLET | Freq: Once | ORAL | Status: AC
  Administered 2019-10-20: 8 mg via ORAL

## 2019-10-20 NOTE — ED Provider Notes (Signed)
[facility] ____________________________________________  Time seen: Approximately 1:12 PM  I have reviewed the triage vital signs and the nursing notes.   HISTORY  Chief Complaint Dizziness, Nausea, and Insect Bite   HPI Terri Woods is a 49 y.o. female presenting for evaluation of headache, dizziness, chest pain, nausea and insect bites.  Patient reports approximately 3 to 4 days ago she got stung to left calf by insect as well as she had a tick bite to her right abdomen and ends up back to her right arm, reports she noticed some redness to that areas.  Reports since yesterday she has been having a headache with dizziness as well as some chest pain.  Describes chest pain as a constant.  Denies shortness of breath.  Describes headache as "splitting "gradual onset.  Dizziness present since this morning and reports she feels unsteady on her feet and the room is spinning.  Reports she does have a history of vertigo but this is different than her normal vertigo.  States that she feels generally weak with dizziness.  Denies vision changes, paresthesias, unilateral weakness, fevers.  Patient did request a COVID-19 test.  Denies injury.  Denies recent sickness.  Care, [facility] : PCP   Past Medical History:  Diagnosis Date  . Arthritis    neck, hips, fingers  . Asthma   . Back pain   . Breast cancer ([place]) 2001   RT LUMPECTOMY PER PT  . Carpal tunnel syndrome    right arm  . Crohn's disease ([place])   . Diabetes ([place])   . Fibromyalgia   . GERD (gastroesophageal reflux disease)   . Glaucoma   . Headache   . Hypertension     There are no problems to display for this patient.   Past Surgical History:  Procedure Laterality Date  . BILATERAL CARPAL TUNNEL RELEASE Bilateral 02/05/2019   Procedure: BILATERAL CARPAL TUNNEL RELEASE;  Surgeon: Hessie Knows, MD;  Location: [facility];  Service: Orthopedics;  Laterality: Bilateral;  . BREAST EXCISIONAL BIOPSY Right 15+ yrs  ago   NEG  . BREAST SURGERY    . FOOT SURGERY Left   . LYMPH NODE BIOPSY    . NASAL SEPTOPLASTY W/ TURBINOPLASTY Bilateral 12/25/2018   Procedure: NASAL SEPTOPLASTY WITH INFERIOR TURBINATE REDUCTION;  Surgeon: Margaretha Sheffield, MD;  Location: [place];  Service: ENT;  Laterality: Bilateral;  . SHOULDER SURGERY    . SMALL INTESTINE SURGERY       No current facility-administered medications for this encounter.  Current Outpatient Medications:  .  albuterol (VENTOLIN HFA) 108 (90 Base) MCG/ACT inhaler, Inhale 2 puffs into the lungs every 6 (six) hours as needed for wheezing or shortness of breath., Disp: , Rfl:  .  atorvastatin (LIPITOR) 20 MG tablet, Take 20 mg by mouth daily at 6 PM., Disp: , Rfl:  .  bimatoprost (LUMIGAN) 0.01 % SOLN, Place 1 drop into both eyes at bedtime., Disp: , Rfl:  .  brimonidine (ALPHAGAN) 0.2 % ophthalmic solution, Place 2 drops into both eyes 2 (two) times daily. , Disp: , Rfl:  .  clonazePAM (KLONOPIN) 1 MG tablet, Take 1 mg by mouth 2 (two) times daily. , Disp: , Rfl:  .  cyclobenzaprine (FLEXERIL) 10 MG tablet, Take 5 mg by mouth at bedtime. AND PRN, Disp: , Rfl:  .  docusate sodium (COLACE) 100 MG capsule, Take 100 mg by mouth 2 (two) times daily as needed for mild constipation., Disp: , Rfl:  .  DULoxetine (CYMBALTA) 60 MG capsule, Take 60 mg by mouth at bedtime., Disp: , Rfl:  .  EPINEPHrine 0.3 mg/0.3 mL IJ SOAJ injection, Inject 0.3 mg into the muscle as needed for anaphylaxis. , Disp: , Rfl:  .  ergocalciferol (VITAMIN D2) 1.25 MG (50000 UT) capsule, Take 50,000 Units by mouth See admin instructions. Take 1 capsule (50000 units) by mouth once a week or once every other week, Disp: , Rfl:  .  glipiZIDE (GLUCOTROL) 5 MG tablet, Take 10 mg by mouth 2 (two) times daily., Disp: , Rfl:  .  ibuprofen (ADVIL) 800 MG tablet, Take 800 mg by mouth every 8 (eight) hours as needed (pain.)., Disp: , Rfl:  .  insulin glargine (LANTUS) 100 UNIT/ML injection,  Inject 20 Units into the skin at bedtime. , Disp: , Rfl:  .  ketoconazole (NIZORAL) 2 % shampoo, Apply 1 application topically 2 (two) times a week., Disp: , Rfl:  .  lidocaine (XYLOCAINE) 5 % ointment, Apply 1 application topically as needed (pain.). , Disp: , Rfl:  .  lisinopril (ZESTRIL) 2.5 MG tablet, Take 2.5 mg by mouth every evening., Disp: , Rfl:  .  miconazole (MICOTIN) 2 % cream, Apply 1 application topically as needed (skin irritation/rash). , Disp: , Rfl:  .  naloxone (NARCAN) nasal spray 4 mg/0.1 mL, Place 1 spray into the nose as needed (accidental overdose)., Disp: , Rfl:  .  omeprazole (PRILOSEC) 20 MG capsule, Take 20 mg by mouth at bedtime., Disp: , Rfl:  .  Oxycodone HCl 10 MG TABS, Take 10 mg by mouth 3 (three) times daily., Disp: , Rfl:  .  oxyCODONE-acetaminophen (PERCOCET/ROXICET) 5-325 MG tablet, Take 1 tablet by mouth every 4 (four) hours as needed (pain.). , Disp: , Rfl:  .  QUEtiapine (SEROQUEL) 300 MG tablet, Take 300 mg by mouth at bedtime., Disp: , Rfl:  .  SUMAtriptan (IMITREX) 100 MG tablet, Take 100 mg by mouth every 2 (two) hours as needed for migraine. May repeat in 2 hours if headache persists or recurs., Disp: , Rfl:  .  triamcinolone (NASACORT ALLERGY 24HR) 55 MCG/ACT AERO nasal inhaler, Place 2 sprays into the nose daily., Disp: , Rfl:  .  triamcinolone cream (KENALOG) 0.1 %, Apply 1 application topically 2 (two) times daily., Disp: 30 g, Rfl: 0 .  oxyCODONE-acetaminophen (PERCOCET) 7.5-325 MG tablet, Take 1 tablet by mouth every 4 (four) hours as needed for severe pain., Disp: 20 tablet, Rfl: 0 .  predniSONE (DELTASONE) 20 MG tablet, Take 1 tablet (20 mg total) by mouth daily., Disp: 7 tablet, Rfl: 0 .  sulfamethoxazole-trimethoprim (BACTRIM DS) 800-160 MG tablet, Take 1 tablet by mouth 2 (two) times daily., Disp: 20 tablet, Rfl: 0 .  topiramate (TOPAMAX) 100 MG tablet, Take 100 mg by mouth 2 (two) times daily., Disp: , Rfl:   Allergies Levodopa,  Erythromycin, Bee venom, Doxycycline, and Shellfish allergy  Family History  Problem Relation Age of Onset  . Heart attack Sister 85  . Heart disease Father   . Breast cancer Mother   . Breast cancer Maternal Aunt     Social History Social History   Tobacco Use  . Smoking status: Never Smoker  . Smokeless tobacco: Never Used  Substance Use Topics  . Alcohol use: No    Alcohol/week: 0.0 standard drinks  . Drug use: No    Review of Systems Constitutional: No fever.  Positive chills. Eyes: No visual changes. ENT: No sore throat. Cardiovascular: Denies chest pain.  Respiratory: Denies shortness of breath. Gastrointestinal: No abdominal pain.  Positive nausea. Genitourinary: Negative for dysuria. Skin: Positive for rash. Neurological: Negative for focal weakness or numbness.  Positive headache.  ____________________________________________   PHYSICAL EXAM:  VITAL SIGNS: ED Triage Vitals  Enc Vitals Group     BP 10/20/19 1140 126/87     Pulse Rate 10/20/19 1140 99     Resp 10/20/19 1140 18     Temp 10/20/19 1140 98.2 F (36.8 C)     Temp Source 10/20/19 1140 Oral     SpO2 10/20/19 1140 97 %     Weight 10/20/19 1142 150 lb (68 kg)     Height 10/20/19 1142 5\' 1"  (1.549 m)     Head Circumference --      Peak Flow --      Pain Score 10/20/19 1141 8     Pain Loc --      Pain Edu? --      Excl. in [place]? --     Constitutional: Alert and oriented. Well appearing and in no acute distress. Eyes: Conjunctivae are normal.  ENT      Head: Normocephalic and atraumatic. Cardiovascular: Normal rate, regular rhythm. Grossly normal heart sounds.  Good peripheral circulation. Respiratory: Normal respiratory effort without tachypnea nor retractions. Breath sounds are clear and equal bilaterally. No wheezes, rales, rhonchi. Gastrointestinal: Soft and nontender. Neurologic:  Normal speech and language. Speech is normal.  Unsteady gait.  No paresthesias. Skin:  Skin is warm, dry.   Left calf scabbing with surrounding erythema with tenderness, right abdomen minimally erythematous scabbing, right axilla mildly erythematous small scabbing area. Psychiatric: Mood and affect are normal. Speech and behavior are normal. Patient exhibits appropriate insight and judgment   ___________________________________________   LABS (all labs ordered are listed, but only abnormal results are displayed)  Labs Reviewed  GLUCOSE, CAPILLARY - Abnormal; Notable for the following components:      Result Value   Glucose-Capillary 198 (*)    All other components within normal limits  SARS CORONAVIRUS 2 (TAT 6-24 HRS)  CBG MONITORING, ED   ____________________________________________  EKG  ED ECG REPORT I, Marylene Land, the attending provider, personally viewed and interpreted this ECG.   Date: 10/20/2019  EKG Time: 1244  Rate: 94  Rhythm:  normal sinus rhythm  Axis: normal  Intervals:none  ST&T Change: none  PROCEDURES Procedures   INITIAL IMPRESSION / ASSESSMENT AND PLAN / ED COURSE  Pertinent labs & imaging results that were available during my care of the patient were reviewed by me and considered in my medical decision making (see chart for details).  Patient presenting for multiple medical complaints as above.  However patient with persisting headache, dizziness as well as chest discomfort.  EKG unremarkable at this time.  Patient is notably unsteady on her feet, recommend further evaluation emergency room at this time.  8 mg ODT Zofran given once in urgent care.  Patient states she will have her husband take her to [facility].  Directed to go directly to the emergency room.  ____________________________________________   FINAL CLINICAL IMPRESSION(S) / ED DIAGNOSES  Final diagnoses:  Dizziness  Nausea  Chest pain, unspecified type  Cellulitis of leg, left     ED Discharge Orders    None       Note: This dictation was prepared with Dragon dictation along with  smaller phrase technology. Any transcriptional errors that result from this process are unintentional.  Marylene Land, NP 10/20/19 1318

## 2019-10-20 NOTE — Discharge Instructions (Addendum)
Go directly to the emergency room

## 2019-10-20 NOTE — ED Triage Notes (Signed)
Patient in today c/o dizziness and nausea x 1 day. Patient also c/o insect bite to her left lower leg x 3 days. Patient denies fever. Patient requesting a Covid test today. Patient is not vaccinated. Patient has routine appointment with PCP tomorrow. Patient has taken OTC Ibuprofen for her symptoms.

## 2019-11-23 LAB — COLOGUARD: COLOGUARD: NEGATIVE

## 2019-11-23 LAB — EXTERNAL GENERIC LAB PROCEDURE: COLOGUARD: NEGATIVE

## 2020-01-06 ENCOUNTER — Other Ambulatory Visit: Payer: Self-pay

## 2020-01-06 ENCOUNTER — Ambulatory Visit
Admission: EM | Admit: 2020-01-06 | Discharge: 2020-01-06 | Disposition: A | Discharge status: Home / Self Care | Payer: Medicare Other | Attending: Family Medicine | Admitting: Family Medicine

## 2020-01-06 DIAGNOSIS — N760 Acute vaginitis: Secondary | ICD-10-CM | POA: Insufficient documentation

## 2020-01-06 DIAGNOSIS — L0291 Cutaneous abscess, unspecified: Secondary | ICD-10-CM | POA: Insufficient documentation

## 2020-01-06 DIAGNOSIS — B9689 Other specified bacterial agents as the cause of diseases classified elsewhere: Secondary | ICD-10-CM | POA: Diagnosis present

## 2020-01-06 LAB — URINALYSIS, COMPLETE (UACMP) WITH MICROSCOPIC
Bacteria, UA: NONE SEEN
Bilirubin Urine: NEGATIVE
Glucose, UA: >1000 mg/dL — AB
Hgb urine dipstick: NEGATIVE
Ketones, ur: NEGATIVE mg/dL
Leukocytes,Ua: NEGATIVE
Nitrite: NEGATIVE
Protein, ur: NEGATIVE mg/dL
Specific Gravity, Urine: >1.03 — ABNORMAL HIGH (ref 1.005–1.030)
pH: 5 (ref 5.0–8.0)

## 2020-01-06 LAB — WET PREP, GENITAL
Sperm: NONE SEEN
Trich, Wet Prep: NONE SEEN
WBC, Wet Prep HPF POC: NONE SEEN
Yeast Wet Prep HPF POC: NONE SEEN

## 2020-01-06 MED ORDER — CLINDAMYCIN HCL 150 MG PO CAPS: 450.0000 mg | ORAL_CAPSULE | Freq: Three times a day (TID) | ORAL | 0 refills | Status: AC

## 2020-01-06 NOTE — ED Triage Notes (Signed)
Patient in today c/o vaginal itching and swelling, urinary frequency x 3 days. Patient was treated for a yeast infection ~1 month ago, but never got better.

## 2020-01-06 NOTE — Discharge Instructions (Signed)
Warm compresses.  Medication as prescribed.  Call GYN for an appt as soon as possible.

## 2020-01-07 NOTE — ED Provider Notes (Signed)
[facility]    CSN: [id number] Arrival date & time: 01/06/20  1309      History   Chief Complaint Chief Complaint  Patient presents with  . Vaginal Itching  . Urinary Frequency   HPI  49 year old female presents with the above complaints.  Patient reports that she has had ongoing symptoms for the past 3 days.  She reports an area of swelling and pain around her clitoris.  She states that the area is very swollen and very tender to the touch.  She also reports ongoing vaginal itching.  She states that this has been progressively worsening.  Rates her pain as 9/10 in severity.  No relieving factors.  No fever.    Past Medical History:  Diagnosis Date  . Arthritis    neck, hips, fingers  . Asthma   . Back pain   . Breast cancer ([place]) 2001   RT LUMPECTOMY PER PT  . Carpal tunnel syndrome    right arm  . Crohn's disease ([place])   . Diabetes ([place])   . Fibromyalgia   . GERD (gastroesophageal reflux disease)   . Glaucoma   . Headache   . Hypertension    Past Surgical History:  Procedure Laterality Date  . BILATERAL CARPAL TUNNEL RELEASE Bilateral 02/05/2019   Procedure: BILATERAL CARPAL TUNNEL RELEASE;  Surgeon: Hessie Knows, MD;  Location: [facility];  Service: Orthopedics;  Laterality: Bilateral;  . BREAST EXCISIONAL BIOPSY Right 15+ yrs ago   NEG  . BREAST SURGERY    . FOOT SURGERY Left   . LYMPH NODE BIOPSY    . NASAL SEPTOPLASTY W/ TURBINOPLASTY Bilateral 12/25/2018   Procedure: NASAL SEPTOPLASTY WITH INFERIOR TURBINATE REDUCTION;  Surgeon: Margaretha Sheffield, MD;  Location: [place];  Service: ENT;  Laterality: Bilateral;  . SHOULDER SURGERY    . SMALL INTESTINE SURGERY      OB History   No obstetric history on file.      Home Medications    Prior to Admission medications   Medication Sig Start Date End Date Taking? Authorizing Provider  albuterol (VENTOLIN HFA) 108 (90 Base) MCG/ACT inhaler Inhale 2 puffs into the lungs every 6 (six) hours  as needed for wheezing or shortness of breath.   Yes [provider]  atorvastatin (LIPITOR) 20 MG tablet Take 20 mg by mouth daily at 6 PM.   Yes [provider]  bimatoprost (LUMIGAN) 0.01 % SOLN Place 1 drop into both eyes at bedtime.   Yes [provider]  brimonidine (ALPHAGAN) 0.2 % ophthalmic solution Place 2 drops into both eyes 2 (two) times daily.    Yes [provider]  clonazePAM (KLONOPIN) 1 MG tablet Take 1 mg by mouth 2 (two) times daily.    Yes [provider]  cyclobenzaprine (FLEXERIL) 10 MG tablet Take 5 mg by mouth at bedtime. AND PRN   Yes [provider]  docusate sodium (COLACE) 100 MG capsule Take 100 mg by mouth 2 (two) times daily as needed for mild constipation.   Yes [provider]  DULoxetine (CYMBALTA) 60 MG capsule Take 60 mg by mouth at bedtime.   Yes [provider]  EPINEPHrine 0.3 mg/0.3 mL IJ SOAJ injection Inject 0.3 mg into the muscle as needed for anaphylaxis.    Yes [provider]  ergocalciferol (VITAMIN D2) 1.25 MG (50000 UT) capsule Take 50,000 Units by mouth See admin instructions. Take 1 capsule (50000 units) by mouth once a week or once  every other week   Yes [provider]  glipiZIDE (GLUCOTROL) 5 MG tablet Take 10 mg by mouth 2 (two) times daily.   Yes [provider]  ibuprofen (ADVIL) 800 MG tablet Take 800 mg by mouth every 8 (eight) hours as needed (pain.).   Yes [provider]  insulin glargine (LANTUS) 100 UNIT/ML injection Inject 20 Units into the skin at bedtime.    Yes [provider]  ketoconazole (NIZORAL) 2 % shampoo Apply 1 application topically 2 (two) times a week.   Yes [provider]  lidocaine (XYLOCAINE) 5 % ointment Apply 1 application topically as needed (pain.).    Yes [provider]  lisinopril (ZESTRIL) 2.5 MG tablet Take 2.5 mg by mouth every evening.   Yes [provider]    miconazole (MICOTIN) 2 % cream Apply 1 application topically as needed (skin irritation/rash).    Yes [provider]  naloxone (NARCAN) nasal spray 4 mg/0.1 mL Place 1 spray into the nose as needed (accidental overdose).   Yes [provider]  omeprazole (PRILOSEC) 20 MG capsule Take 20 mg by mouth at bedtime.   Yes [provider]  Oxycodone HCl 10 MG TABS Take 10 mg by mouth 3 (three) times daily.   Yes [provider]  oxyCODONE-acetaminophen (PERCOCET/ROXICET) 5-325 MG tablet Take 1 tablet by mouth every 4 (four) hours as needed (pain.).    Yes [provider]  QUEtiapine (SEROQUEL) 300 MG tablet Take 300 mg by mouth at bedtime.   Yes [provider]  SUMAtriptan (IMITREX) 100 MG tablet Take 100 mg by mouth every 2 (two) hours as needed for migraine. May repeat in 2 hours if headache persists or recurs.   Yes [provider]  triamcinolone (NASACORT ALLERGY 24HR) 55 MCG/ACT AERO nasal inhaler Place 2 sprays into the nose daily.   Yes [provider]  triamcinolone cream (KENALOG) 0.1 % Apply 1 application topically 2 (two) times daily. 08/31/19  Yes Norval Gable, MD  clindamycin (CLEOCIN) 150 MG capsule Take 3 capsules (450 mg total) by mouth 3 (three) times daily for 7 days. 01/06/20 01/13/20  Coral Spikes, DO  topiramate (TOPAMAX) 100 MG tablet Take 100 mg by mouth 2 (two) times daily.  01/06/20  [provider]    Family History Family History  Problem Relation Age of Onset  . Heart attack Sister 66  . Heart disease Father   . Breast cancer Mother   . Breast cancer Maternal Aunt     Social History Social History   Tobacco Use  . Smoking status: Never Smoker  . Smokeless tobacco: Never Used  Vaping Use  . Vaping Use: Never used  Substance Use Topics  . Alcohol use: No    Alcohol/week: 0.0 standard drinks  . Drug use: No     Allergies   Levodopa, Erythromycin, Bee venom, Doxycycline,  Penicillins, and Shellfish allergy   Review of Systems Review of Systems  Constitutional: Negative for fever.  Genitourinary:       Vaginal itching. Pain, swelling - around clitoris.   Physical Exam Triage Vital Signs ED Triage Vitals  Enc Vitals Group     BP 01/06/20 1357 (!) 150/97     Pulse Rate 01/06/20 1357 88     Resp 01/06/20 1357 18     Temp 01/06/20 1357 98.2 F (36.8 C)     Temp Source 01/06/20 1357 Oral     SpO2 01/06/20 1357 97 %  Weight 01/06/20 1358 158 lb (71.7 kg)     Height 01/06/20 1358 5\' 1"  (1.549 m)     Head Circumference --      Peak Flow --      Pain Score 01/06/20 1357 9     Pain Loc --      Pain Edu? --      Excl. in [place]? --    Updated Vital Signs BP (!) 150/97 (BP Location: Left Arm)   Pulse 88   Temp 98.2 F (36.8 C) (Oral)   Resp 18   Ht 5\' 1"  (1.549 m)   Wt 71.7 kg   LMP 12/18/2018 (Approximate) Comment: preg test neg  SpO2 97%   BMI 29.85 kg/m   Visual Acuity Right Eye Distance:   Left Eye Distance:   Bilateral Distance:    Right Eye Near:   Left Eye Near:    Bilateral Near:     Physical Exam Vitals and nursing note reviewed.  Constitutional:      General: She is not in acute distress.    Appearance: Normal appearance. She is not ill-appearing.  HENT:     Head: Normocephalic and atraumatic.  Eyes:     General:        Right eye: No discharge.        Left eye: No discharge.     Conjunctiva/sclera: Conjunctivae normal.  Pulmonary:     Effort: Pulmonary effort is normal. No respiratory distress.  Genitourinary:      Comments: Clitoral hood appears to be edematous and very tender to palpation. Neurological:     Mental Status: She is alert.  Psychiatric:        Mood and Affect: Mood normal.        Behavior: Behavior normal.    UC Treatments / Results  Labs (all labs ordered are listed, but only abnormal results are displayed) Labs Reviewed  WET PREP, GENITAL - Abnormal; Notable for the following components:       Result Value   Clue Cells Wet Prep HPF POC PRESENT (*)    All other components within normal limits  URINALYSIS, COMPLETE (UACMP) WITH MICROSCOPIC - Abnormal; Notable for the following components:   APPearance HAZY (*)    Specific Gravity, Urine >1.030 (*)    Glucose, UA >1,000 (*)    All other components within normal limits    EKG   Radiology No results found.  Procedures Procedures (including critical care time)  Medications Ordered in UC Medications - No data to display  Initial Impression / Assessment and Plan / UC Course  I have reviewed the triage vital signs and the nursing notes.  Pertinent labs & imaging results that were available during my care of the patient were reviewed by me and considered in my medical decision making (see chart for details).    49 year old female presents with what appears to be an small abscess around the clitoris.  Patient also has bacterial vaginosis.  Treating with clindamycin as we will cover for both.  Advised warm compresses.  Advised to see OB/GYN.  Final Clinical Impressions(s) / UC Diagnoses   Final diagnoses:  Abscess  Bacterial vaginosis     Discharge Instructions     Warm compresses.  Medication as prescribed.  Call GYN for an appt as soon as possible.   ED Prescriptions    Medication Sig Dispense Auth. Provider   clindamycin (CLEOCIN) 150 MG capsule Take 3 capsules (450 mg total) by mouth 3 (three)  times daily for 7 days. 63 capsule Thersa Salt G, DO     PDMP not reviewed this encounter.   Thersa Salt [place], [place] 01/07/20 [phone]

## 2020-03-02 ENCOUNTER — Other Ambulatory Visit: Payer: Self-pay

## 2020-03-02 ENCOUNTER — Ambulatory Visit (INDEPENDENT_AMBULATORY_CARE_PROVIDER_SITE_OTHER): Payer: Medicare Other

## 2020-03-02 ENCOUNTER — Ambulatory Visit
Admission: EM | Admit: 2020-03-02 | Discharge: 2020-03-02 | Disposition: A | Discharge status: Home / Self Care | Payer: Medicare Other | Attending: Family Medicine | Admitting: Family Medicine

## 2020-03-02 ENCOUNTER — Encounter: Payer: Self-pay | Admitting: Emergency Medicine

## 2020-03-02 DIAGNOSIS — S8992XA Unspecified injury of left lower leg, initial encounter: Secondary | ICD-10-CM | POA: Diagnosis not present

## 2020-03-02 DIAGNOSIS — S8991XA Unspecified injury of right lower leg, initial encounter: Secondary | ICD-10-CM

## 2020-03-02 DIAGNOSIS — S199XXA Unspecified injury of neck, initial encounter: Secondary | ICD-10-CM

## 2020-03-02 DIAGNOSIS — S161XXA Strain of muscle, fascia and tendon at neck level, initial encounter: Secondary | ICD-10-CM | POA: Diagnosis not present

## 2020-03-02 DIAGNOSIS — S4992XA Unspecified injury of left shoulder and upper arm, initial encounter: Secondary | ICD-10-CM

## 2020-03-02 DIAGNOSIS — M542 Cervicalgia: Secondary | ICD-10-CM

## 2020-03-02 MED ORDER — TIZANIDINE HCL 4 MG PO TABS: 4.0000 mg | ORAL_TABLET | Freq: Three times a day (TID) | ORAL | 0 refills | Status: DC | PRN

## 2020-03-02 NOTE — ED Triage Notes (Signed)
Patient states she fell off of her horse this morning. She states she hit her head on the ground. Denies LOC. She is c/o neck pain and back pain.

## 2020-03-02 NOTE — ED Provider Notes (Signed)
[facility]    CSN: [id number] Arrival date & time: 03/02/20  1209  History   Chief Complaint Chief Complaint  Patient presents with  . Fall  . Neck Pain  . Back Pain   HPI  49 year old female with a complicated past medical history including chronic pain and chronic opioid use presents for evaluation after falling off of a horse.  Patient states that she was attempting to get on to her horse.  She states that he subsequently took a step forward and she fell to the ground.  She states that she injured her knees as well as her neck.  She has abrasions to the knees as well as the left arm.  She is complaining of severe neck pain with difficulty with range of motion.  Also reports left shoulder pain/clavicle pain. She has used a heating pad without resolution.  Rates her pain as 10/10 in severity.  No loss of consciousness.  No other reported injuries.  No other complaints at this time.  Past Medical History:  Diagnosis Date  . Arthritis    neck, hips, fingers  . Asthma   . Back pain   . Breast cancer ([place]) 2001   RT LUMPECTOMY PER PT  . Carpal tunnel syndrome    right arm  . Crohn's disease ([place])   . Diabetes ([place])   . Fibromyalgia   . GERD (gastroesophageal reflux disease)   . Glaucoma   . Headache   . Hypertension     Past Surgical History:  Procedure Laterality Date  . BILATERAL CARPAL TUNNEL RELEASE Bilateral 02/05/2019   Procedure: BILATERAL CARPAL TUNNEL RELEASE;  Surgeon: Hessie Knows, MD;  Location: [facility];  Service: Orthopedics;  Laterality: Bilateral;  . BREAST EXCISIONAL BIOPSY Right 15+ yrs ago   NEG  . BREAST SURGERY    . FOOT SURGERY Left   . LYMPH NODE BIOPSY    . NASAL SEPTOPLASTY W/ TURBINOPLASTY Bilateral 12/25/2018   Procedure: NASAL SEPTOPLASTY WITH INFERIOR TURBINATE REDUCTION;  Surgeon: Margaretha Sheffield, MD;  Location: [place];  Service: ENT;  Laterality: Bilateral;  . SHOULDER SURGERY    . SMALL INTESTINE SURGERY       OB History   No obstetric history on file.      Home Medications    Prior to Admission medications   Medication Sig Start Date End Date Taking? Authorizing Provider  albuterol (VENTOLIN HFA) 108 (90 Base) MCG/ACT inhaler Inhale 2 puffs into the lungs every 6 (six) hours as needed for wheezing or shortness of breath.   Yes [provider]  atorvastatin (LIPITOR) 20 MG tablet Take 20 mg by mouth daily at 6 PM.   Yes [provider]  bimatoprost (LUMIGAN) 0.01 % SOLN Place 1 drop into both eyes at bedtime.   Yes [provider]  brimonidine (ALPHAGAN) 0.2 % ophthalmic solution Place 2 drops into both eyes 2 (two) times daily.    Yes [provider]  clonazePAM (KLONOPIN) 1 MG tablet Take 1 mg by mouth 2 (two) times daily.    Yes [provider]  docusate sodium (COLACE) 100 MG capsule Take 100 mg by mouth 2 (two) times daily as needed for mild constipation.   Yes [provider]  DULoxetine (CYMBALTA) 60 MG capsule Take 60 mg by mouth at bedtime.   Yes [provider]  EPINEPHrine 0.3 mg/0.3 mL IJ SOAJ injection Inject 0.3 mg into the muscle as needed for anaphylaxis.    Yes [provider]  ergocalciferol (VITAMIN D2) 1.25 MG (50000 UT) capsule Take 50,000 Units by mouth See admin instructions. Take 1 capsule (50000 units) by mouth once a week or once every other week   Yes [provider]  glipiZIDE (GLUCOTROL) 5 MG tablet Take 10 mg by mouth 2 (two) times daily.   Yes [provider]  ibuprofen (ADVIL) 800 MG tablet Take 800 mg by mouth every 8 (eight) hours as needed (pain.).   Yes [provider]  insulin glargine (LANTUS) 100 UNIT/ML injection Inject 20 Units into the skin at bedtime.    Yes [provider]  ketoconazole (NIZORAL) 2 % shampoo Apply 1 application topically 2 (two) times a week.   Yes [provider]  lidocaine (XYLOCAINE) 5 % ointment Apply 1 application  topically as needed (pain.).    Yes [provider]  lisinopril (ZESTRIL) 2.5 MG tablet Take 2.5 mg by mouth every evening.   Yes [provider]  miconazole (MICOTIN) 2 % cream Apply 1 application topically as needed (skin irritation/rash).    Yes [provider]  naloxone (NARCAN) nasal spray 4 mg/0.1 mL Place 1 spray into the nose as needed (accidental overdose).   Yes [provider]  omeprazole (PRILOSEC) 20 MG capsule Take 20 mg by mouth at bedtime.   Yes [provider]  Oxycodone HCl 10 MG TABS Take 10 mg by mouth 3 (three) times daily.   Yes [provider]  oxyCODONE-acetaminophen (PERCOCET/ROXICET) 5-325 MG tablet Take 1 tablet by mouth every 4 (four) hours as needed (pain.).    Yes [provider]  QUEtiapine (SEROQUEL) 300 MG tablet Take 300 mg by mouth at bedtime.   Yes [provider]  SUMAtriptan (IMITREX) 100 MG tablet Take 100 mg by mouth every 2 (two) hours as needed for migraine. May repeat in 2 hours if headache persists or recurs.   Yes [provider]  triamcinolone (NASACORT ALLERGY 24HR) 55 MCG/ACT AERO nasal inhaler Place 2 sprays into the nose daily.   Yes [provider]  triamcinolone cream (KENALOG) 0.1 % Apply 1 application topically 2 (two) times daily. 08/31/19  Yes Norval Gable, MD  tiZANidine (ZANAFLEX) 4 MG tablet Take 1 tablet (4 mg total) by mouth every 8 (eight) hours as needed for muscle spasms. 03/02/20   Coral Spikes, DO  topiramate (TOPAMAX) 100 MG tablet Take 100 mg by mouth 2 (two) times daily.  01/06/20  [provider]    Family History Family History  Problem Relation Age of Onset  . Heart attack Sister 57  . Heart disease Father   . Breast cancer Mother   . Breast cancer Maternal Aunt     Social History Social History   Tobacco Use  . Smoking status: Never Smoker  . Smokeless tobacco: Never Used  Vaping Use  . Vaping Use: Never used    Substance Use Topics  . Alcohol use: No    Alcohol/week: 0.0 standard drinks  . Drug use: No     Allergies   Levodopa, Erythromycin, Bee venom, Doxycycline, Penicillins, and Shellfish allergy   Review of Systems Review of Systems Per HPI  Physical Exam Triage Vital Signs ED Triage Vitals  Enc Vitals Group     BP 03/02/20 1241 (!) 152/98     Pulse Rate 03/02/20 1241 86     Resp 03/02/20 1241 18     Temp 03/02/20 1241 98.2 F (36.8 C)     Temp  Source 03/02/20 1241 Oral     SpO2 03/02/20 1241 98 %     Weight 03/02/20 1239 155 lb (70.3 kg)     Height 03/02/20 1239 5\' 1"  (1.549 m)     Head Circumference --      Peak Flow --      Pain Score 03/02/20 1239 10     Pain Loc --      Pain Edu? --      Excl. in [place]? --    Updated Vital Signs BP (!) 152/98 (BP Location: Right Arm)   Pulse 86   Temp 98.2 F (36.8 C) (Oral)   Resp 18   Ht 5\' 1"  (1.549 m)   Wt 70.3 kg   SpO2 98%   BMI 29.29 kg/m   Visual Acuity Right Eye Distance:   Left Eye Distance:   Bilateral Distance:    Right Eye Near:   Left Eye Near:    Bilateral Near:     Physical Exam Vitals and nursing note reviewed.  Constitutional:      General: She is not in acute distress.    Appearance: Normal appearance.  HENT:     Head: Normocephalic and atraumatic.  Eyes:     General:        Right eye: No discharge.        Left eye: No discharge.     Conjunctiva/sclera: Conjunctivae normal.  Neck:     Comments: Markedly decreased range of motion secondary to pain.  Spasm noted. Cardiovascular:     Rate and Rhythm: Normal rate and regular rhythm.  Pulmonary:     Effort: Pulmonary effort is normal.     Breath sounds: Normal breath sounds. No wheezing, rhonchi or rales.  Musculoskeletal:     Comments: Bilateral knees with tenderness anteriorly.  Abrasions noted.  Neurological:     Mental Status: She is alert.    UC Treatments / Results  Labs (all labs ordered are listed, but only abnormal results are  displayed) Labs Reviewed - No data to display  EKG   Radiology DG Cervical Spine Complete  Result Date: 03/02/2020 CLINICAL DATA:  [place] from horse today.  Neck pain EXAM: CERVICAL SPINE - COMPLETE 4+ VIEW COMPARISON:  None. FINDINGS: There is no evidence of cervical spine fracture or prevertebral soft tissue swelling. Alignment is normal. No other significant bone abnormalities are identified. IMPRESSION: Negative cervical spine radiographs. Electronically Signed   By: Nelson Chimes M.D.   On: 03/02/2020 14:15   DG Clavicle Left  Result Date: 03/02/2020 CLINICAL DATA:  [place] from horse today. EXAM: LEFT CLAVICLE - 2+ VIEWS COMPARISON:  None. FINDINGS: There is no evidence of fracture or other focal bone lesions. Soft tissues are unremarkable. IMPRESSION: Negative. Electronically Signed   By: Nelson Chimes M.D.   On: 03/02/2020 14:16   DG Knee Complete 4 Views Left  Result Date: 03/02/2020 CLINICAL DATA:  [place] from horse today. EXAM: LEFT KNEE - COMPLETE 4+ VIEW COMPARISON:  None. FINDINGS: No evidence of fracture, dislocation, or joint effusion. No evidence of arthropathy or other focal bone abnormality. Soft tissues are unremarkable. IMPRESSION: Negative. Electronically Signed   By: Nelson Chimes M.D.   On: 03/02/2020 14:14   DG Knee Complete 4 Views Right  Result Date: 03/02/2020 CLINICAL DATA:  [place] from horse today. EXAM: RIGHT KNEE - COMPLETE 4+ VIEW COMPARISON:  None. FINDINGS: No evidence of fracture, dislocation, or joint effusion. No evidence of arthropathy or other focal bone abnormality.  Soft tissues are unremarkable. IMPRESSION: Negative. Electronically Signed   By: Nelson Chimes M.D.   On: 03/02/2020 14:15    Procedures Procedures (including critical care time)  Medications Ordered in UC Medications - No data to display  Initial Impression / Assessment and Plan / UC Course  I have reviewed the triage vital signs and the nursing notes.  Pertinent labs & imaging results  that were available during my care of the patient were reviewed by me and considered in my medical decision making (see chart for details).    49 year old female presents with musculoskeletal pain after falling off a horse.  X-rays were obtained of the knees, left clavicle, and cervical spine.  X-rays were independently interpreted by me.  There were no acute fractures or acute findings.  Advise rest, heat.  Zanaflex as directed.  Patient is to continue her home pain medication as prescribed.  Final Clinical Impressions(s) / UC Diagnoses   Final diagnoses:  Strain of neck muscle, initial encounter  Fall from horse, initial encounter     Discharge Instructions     Rest.  Heat.  Home pain medication as prescribed.  Muscle relaxer as directed.  Take care  Dr. Lacinda Axon     ED Prescriptions    Medication Sig Dispense Auth. Provider   tiZANidine (ZANAFLEX) 4 MG tablet Take 1 tablet (4 mg total) by mouth every 8 (eight) hours as needed for muscle spasms. 30 tablet Coral Spikes, DO     PDMP not reviewed this encounter.   Coral Spikes, DO 03/02/20 1450

## 2020-03-02 NOTE — Discharge Instructions (Signed)
Rest.  Heat.  Home pain medication as prescribed.  Muscle relaxer as directed.  Take care  Dr. Lacinda Axon

## 2020-03-08 ENCOUNTER — Other Ambulatory Visit
Admission: RE | Admit: 2020-03-08 | Discharge: 2020-03-08 | Disposition: A | Discharge status: Home / Self Care | Payer: Medicare Other | Source: Ambulatory Visit | Attending: Surgery | Admitting: Surgery

## 2020-03-08 DIAGNOSIS — Z01812 Encounter for preprocedural laboratory examination: Secondary | ICD-10-CM | POA: Diagnosis present

## 2020-03-08 DIAGNOSIS — Z20822 Contact with and (suspected) exposure to covid-19: Secondary | ICD-10-CM | POA: Diagnosis not present

## 2020-03-08 LAB — SARS CORONAVIRUS 2 (TAT 6-24 HRS): SARS Coronavirus 2: NEGATIVE

## 2020-03-10 ENCOUNTER — Encounter: Payer: Self-pay | Admitting: Surgery

## 2020-03-10 ENCOUNTER — Encounter
Admission: RE | Disposition: A | Discharge status: Home / Self Care | Payer: Self-pay | Source: Home / Self Care | Attending: Surgery

## 2020-03-10 ENCOUNTER — Ambulatory Visit
Admission: RE | Admit: 2020-03-10 | Discharge: 2020-03-10 | Disposition: A | Discharge status: Home / Self Care | Payer: Medicare Other | Attending: Surgery | Admitting: Surgery

## 2020-03-10 ENCOUNTER — Ambulatory Visit: Payer: Medicare Other | Admitting: Certified Registered"

## 2020-03-10 DIAGNOSIS — Z853 Personal history of malignant neoplasm of breast: Secondary | ICD-10-CM | POA: Insufficient documentation

## 2020-03-10 DIAGNOSIS — K640 First degree hemorrhoids: Secondary | ICD-10-CM | POA: Diagnosis not present

## 2020-03-10 DIAGNOSIS — K644 Residual hemorrhoidal skin tags: Secondary | ICD-10-CM | POA: Diagnosis not present

## 2020-03-10 DIAGNOSIS — G894 Chronic pain syndrome: Secondary | ICD-10-CM | POA: Diagnosis not present

## 2020-03-10 DIAGNOSIS — Z1211 Encounter for screening for malignant neoplasm of colon: Secondary | ICD-10-CM | POA: Diagnosis present

## 2020-03-10 DIAGNOSIS — Z79891 Long term (current) use of opiate analgesic: Secondary | ICD-10-CM | POA: Diagnosis not present

## 2020-03-10 HISTORY — PX: COLONOSCOPY WITH PROPOFOL: SHX5780

## 2020-03-10 LAB — GLUCOSE, CAPILLARY: Glucose-Capillary: 198 mg/dL — ABNORMAL HIGH (ref 70–99)

## 2020-03-10 LAB — POCT PREGNANCY, URINE: Preg Test, Ur: NEGATIVE

## 2020-03-10 SURGERY — COLONOSCOPY WITH PROPOFOL
Anesthesia: General

## 2020-03-10 MED ORDER — SODIUM CHLORIDE 0.9 % IV SOLN: INTRAVENOUS | Status: DC

## 2020-03-10 MED ORDER — PROPOFOL 10 MG/ML IV BOLUS
INTRAVENOUS | Status: DC | PRN
  Administered 2020-03-10: 20 mg via INTRAVENOUS
  Administered 2020-03-10: 50 mg via INTRAVENOUS

## 2020-03-10 MED ORDER — PROPOFOL 500 MG/50ML IV EMUL
INTRAVENOUS | Status: DC | PRN
  Administered 2020-03-10: 130 ug/kg/min via INTRAVENOUS

## 2020-03-10 MED ORDER — ONDANSETRON HCL 4 MG/2ML IJ SOLN
INTRAMUSCULAR | Status: AC
  Administered 2020-03-10: 4 mg via INTRAVENOUS
  Filled 2020-03-10: qty 2

## 2020-03-10 NOTE — Anesthesia Preprocedure Evaluation (Addendum)
Anesthesia Evaluation  Patient identified by MRN, date of birth, ID band Patient awake    Reviewed: Allergy & Precautions, NPO status , Patient's Chart, lab work & pertinent test results  History of Anesthesia Complications Negative for: history of anesthetic complications  Airway Mallampati: III  TM Distance: >3 FB Neck ROM: Full    Dental  (+) Poor Dentition, Chipped,    Pulmonary neg shortness of breath, asthma , neg sleep apnea, neg COPD, Not current smoker,    Pulmonary exam normal breath sounds clear to auscultation       Cardiovascular hypertension, Pt. on medications (-) Past MI and (-) CHF (-) dysrhythmias + Valvular Problems/Murmurs (murmur, no tx)  Rhythm:Regular Rate:Normal - Systolic murmurs    Neuro/Psych neg Seizures    GI/Hepatic Neg liver ROS, GERD  Medicated and Controlled,  Endo/Other  diabetes, Type 2, Oral Hypoglycemic Agents, Insulin Dependent  Renal/GU negative Renal ROS     Musculoskeletal  (+) Fibromyalgia -  Abdominal   Peds  Hematology   Anesthesia Other Findings Past Medical History: No date: Arthritis     Comment:  neck, hips, fingers No date: Asthma No date: Back pain 2001: Breast cancer ([place])     Comment:  RT LUMPECTOMY PER PT No date: Carpal tunnel syndrome     Comment:  right arm No date: Crohn's disease ([facility]) No date: Diabetes ([place]) No date: Fibromyalgia No date: GERD (gastroesophageal reflux disease) No date: Glaucoma No date: Headache No date: Hypertension   Reproductive/Obstetrics                            Anesthesia Physical  Anesthesia Plan  ASA: III  Anesthesia Plan: General   Post-op Pain Management:    Induction: Intravenous  PONV Risk Score and Plan: 3 and Ondansetron, Propofol infusion and TIVA  Airway Management Planned: Natural Airway  Additional Equipment: None  Intra-op Plan:   Post-operative Plan:   Informed  Consent: I have reviewed the patients History and Physical, chart, labs and discussed the procedure including the risks, benefits and alternatives for the proposed anesthesia with the patient or authorized representative who has indicated his/her understanding and acceptance.     Dental advisory given  Plan Discussed with: CRNA and Surgeon  Anesthesia Plan Comments: (Discussed risks of anesthesia with patient, including possibility of difficulty with spontaneous ventilation under anesthesia necessitating airway intervention, PONV, and rare risks such as cardiac or respiratory or neurological events. Patient understands.)        Anesthesia Quick Evaluation

## 2020-03-10 NOTE — Anesthesia Postprocedure Evaluation (Signed)
Anesthesia Post Note  Patient: Terri Woods  Procedure(s) Performed: COLONOSCOPY WITH PROPOFOL (N/A )  Patient location during evaluation: Endoscopy Anesthesia Type: General Level of consciousness: awake and alert Pain management: pain level controlled Vital Signs Assessment: post-procedure vital signs reviewed and stable Respiratory status: spontaneous breathing, nonlabored ventilation, respiratory function stable and patient connected to nasal cannula oxygen Cardiovascular status: blood pressure returned to baseline and stable Postop Assessment: no apparent nausea or vomiting Anesthetic complications: no   No complications documented.   Last Vitals:  Vitals:   03/10/20 1346 03/10/20 1506  BP: (!) 143/88 (!) 161/74  Pulse: 84 100  Resp: 20 15  Temp: (!) 35.8 C 36.7 C  SpO2: 99% 96%    Last Pain:  Vitals:   03/10/20 1506  TempSrc: Temporal  PainSc: Asleep                 Arita Miss

## 2020-03-10 NOTE — Transfer of Care (Signed)
Immediate Anesthesia Transfer of Care Note  Patient: Terri Woods  Procedure(s) Performed: COLONOSCOPY WITH PROPOFOL (N/A )  Patient Location: PACU and Endoscopy Unit  Anesthesia Type:General  Level of Consciousness: drowsy  Airway & Oxygen Therapy: Patient Spontanous Breathing  Post-op Assessment: Report given to RN  Post vital signs: stable  Last Vitals:  Vitals Value Taken Time  BP    Temp    Pulse    Resp    SpO2      Last Pain:  Vitals:   03/10/20 1346  TempSrc: Temporal  PainSc:       Patients Stated Pain Goal: 0 (85/69/43 7005)  Complications: No complications documented.

## 2020-03-10 NOTE — H&P (Signed)
PATIENT PROFILE: Terri Woods is a 49 y.o. female who presents to the Clinic for consultation at the request of Dr. Leonides Schanz for evaluation of inguinal hernias.  PCP: Kreg Shropshire, NP  HISTORY OF PRESENT ILLNESS: Terri Woods reports having pelvic pain for the last 3 weeks. The patient is complaining about severe pain in the pelvic area. The pain radiates to her back. The pain sometimes radiates to her upper abdomen. The pain also radiates to her vagina. Pain is aggravated by certain movements. Pain is aggravated by palpation. Pain is aggravated by bending forward. There is no alleviating factors. Patient also has associated chronic back pain on chronic use of narcotics.  Patient with a very long history of severe constipation. The patient endorses that she needs to use her finger to be able to move her bowels. Patient also having history of fissures most likely due to her severe chronic constipation. Patient also complaining of vaginal pain and irritation. Patient also complaining of rectal bleeding. She reports that the bleeding is mostly with bowel movement. She reports that her bowel movements are so hard that she needs to use her finger to be able to move the bowels and something she feels that she is going to faint due to the straining.  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 05/15/2018  Tingling 09/10/2017  Numbness and tingling 08/21/2017  RLS (restless legs syndrome) 08/21/2017  Post concussion syndrome  08/21/2017  Weakness 08/21/2017  Cramp in limb 08/21/2017  Essential hypertension 07/03/2017  Bilateral hip bursitis 03/22/2015  Chronic pain syndrome 03/22/2015  Fatty liver 04/23/2012  Crohn's disease (CMS-HCC) Unknown  DDD (degenerative disc disease) Unknown  Breast cancer (CMS-HCC) Unknown  Obesity Unknown  Hyperglycemia 10/23/2011  Acute Crohn's disease (CMS-HCC) 11/14/2010  Migraine 11/14/2010  Scoliosis 11/14/2010  Depressive disorder, not elsewhere classified 11/14/2010  Myalgia and myositis, unspecified 11/14/2010  Raynaud's syndrome 11/14/2010  Personal history of malignant neoplasm of breast 11/14/2010  Carpal tunnel syndrome 11/14/2010  Rectal prolapse 11/14/2010  Abnormality of gait 11/14/2010  Disturbance of skin sensation 11/14/2010  Pure hypercholesterolemia 11/14/2010    GENERAL REVIEW OF SYSTEMS:   General ROS: negative for - chills, fatigue, fever, weight gain or weight loss Allergy and Immunology ROS: negative for - hives  Hematological and Lymphatic ROS: negative for - bleeding problems or bruising, negative for palpable nodes Endocrine ROS: negative for - heat or cold intolerance, hair changes Respiratory ROS: negative for - cough, shortness of breath or wheezing Cardiovascular ROS: no chest pain or palpitations GI ROS: negative for diarrhea, positive for constipation, nausea, vomiting Musculoskeletal ROS: Positive for for - joint swelling or muscle pain Neurological ROS: negative for - confusion, syncope Dermatological ROS: negative for pruritus and rash Psychiatric: negative for anxiety, depression, difficulty sleeping and memory loss  MEDICATIONS: Current Outpatient Medications  Medication Sig Dispense Refill  . albuterol (PROAIR HFA) 90 mcg/actuation inhaler Inhale 2 inhalations into the lungs every 4 (four) hours as needed. 3 Inhaler 3  . atorvastatin (LIPITOR) 20 MG tablet Take 1 tablet (20 mg total) by mouth daily. 90 tablet  3  . brimonidine (ALPHAGAN) 0.2 % ophthalmic  solution Apply 1 drop to eye 2 (two) times daily  . clonazePAM (KLONOPIN) 1 MG tablet Take 1 tablet (1 mg total) by mouth 3 (three) times daily as needed for Anxiety. (Patient taking differently: Take 1 mg by mouth 2 (two) times daily ) 90 tablet 0  . cyclobenzaprine (FLEXERIL) 10 MG tablet Take 10 mg by mouth 2 (two) times daily  . diphenhydrAMINE (ALLERGY, DIPHENHYDRAMINE,) 25 mg tablet 1 tab by mouth every 8 hours as needed 6  . DULoxetine (CYMBALTA) 60 MG DR capsule Take 1 capsule (60 mg total) by mouth daily. (Patient taking differently: Take 60 mg by mouth nightly ) 90 capsule 3  . EPINEPHrine (EPIPEN) 0.3 mg/0.3 mL auto-injector Inject into the muscle as needed  . glipiZIDE (GLUCOTROL) 5 MG tablet Take 4 tablets by mouth once daily  . ibuprofen (MOTRIN) 800 MG tablet as needed  . ketoconazole (NIZORAL) 2 % shampoo Apply topically twice a week  . ketorolac (TORADOL) 10 mg tablet as needed  . LANTUS U-100 INSULIN injection (concentration 100 units/mL) 40 units  . lisinopriL (ZESTRIL) 2.5 MG tablet Take 1 tablet by mouth once daily  . mometasone (ELOCON) 0.1 % ointment Apply topically 2 (two) times daily as needed  . montelukast (SINGULAIR) 10 mg tablet once daily  . olopatadine (PATANOL) 0.1 % ophthalmic solution Place into both eyes 2 (two) times daily  . omeprazole (PRILOSEC) 20 MG DR capsule Take 1 capsule (20 mg total) by mouth daily. (Patient taking differently: Take 20 mg by mouth nightly ) 90 capsule 3  . oxyCODONE (OXYCONTIN) 10 MG CR tablet Take by mouth 3 (three) times daily  . oxyCODONE-acetaminophen (PERCOCET) 5-325 mg tablet 1 tab by mouth every 6 hours as needed (Patient taking differently: Take 1 tablet by mouth 3-4 times a day )  . QUEtiapine (SEROQUEL) 100 MG tablet Take 1 tablet (100 mg total) by mouth daily. (Patient taking differently: Take 300 mg by mouth nightly ) 90 tablet 3  . rotigotine (NEUPRO) 1 mg/24 hour Place 1 patch onto the skin once daily For restless legs 30  patch 1  . selenium sulfide 2.25 % Sham Wash hair with this shampoo twice weekly for dandruff 180 mL 6  . senna (SENOKOT) 8.6 mg tablet 1 tab by mouth at bedtime (Patient taking differently: Take 1 tablet by mouth once daily as needed )  . SUMAtriptan (IMITREX) 100 MG tablet Take 1 tab at headache onset, can repeat once in 2 hours if needed. No more than 2 doses in 24 hours 10 tablet 6  . triamcinolone (NASACORT AQ) 55 mcg nasal spray Place 2 sprays into both nostrils once daily  . triamcinolone 0.1 % cream Apply topically as needed  . fluconazole (DIFLUCAN) 150 MG tablet Take 1 tablet (150 mg total) by mouth once. 2 tablet 0   No current facility-administered medications for this visit.   ALLERGIES: Mace (myristica aril), Morphine, Others, Pregabalin, Ragweed, Venom-honey bee, Venom-yellow hornet protein, Amoxicillin, Erythromycin, Others, Others, Phenergan [promethazine], Meloxicam, Metformin, Topamax [topiramate], Doxycycline, Fentanyl, Penicillins, and Shellfish containing products  PAST MEDICAL HISTORY: Past Medical History:  Diagnosis Date  . Anxiety  . Breast cancer (CMS-HCC)  . Carpal tunnel syndrome  . Crohn's disease (CMS-HCC)  . DDD (degenerative disc disease)  . Depression  . Fatty liver 04/23/2012  . Fibromyalgia  . H/O degenerative disc disease  . Hyperglycemia 10/23/2011  . Hyperlipidemia  . Migraine  . Obesity  .  Raynaud's disease  . Rectal prolapse 11/14/2010  . Scoliosis   PAST SURGICAL HISTORY: Past Surgical History:  Procedure Laterality Date  . ENDOSCOPIC CARPAL TUNNEL RELEASE Bilateral 02/05/2019  . nasal surgery  . RECTAL PROLAPSE REPAIR 1996  . RIGHT BREAST CANCER  . TORN ESOPHAGUS REPAIR    FAMILY HISTORY: Family History  Problem Relation Age of Onset  . Cancer Mother  . High blood pressure (Hypertension) Father  . Stroke Father  . Diabetes type II Father  . Cancer Father  . Glaucoma Father  . Myocardial Infarction (Heart attack) Father  .  Mental illness Father    SOCIAL HISTORY: Social History   Socioeconomic History  . Marital status: Married  Spouse name: Not on file  . Number of children: Not on file  . Years of education: Not on file  . Highest education level: Not on file  Occupational History  . Not on file  Tobacco Use  . Smoking status: Never Smoker  . Smokeless tobacco: Never Used  Vaping Use  . Vaping Use: Never used  Substance and Sexual Activity  . Alcohol use: No  . Drug use: No  . Sexual activity: Yes  Partners: Male  Birth control/protection: None  Other Topics Concern  . Not on file  Social History Narrative  . Not on file   Social Determinants of Health   Financial Resource Strain:  . Difficulty of Paying Living Expenses:  Food Insecurity:  . Worried About Charity fundraiser in the Last Year:  . Arboriculturist in the Last Year:  Transportation Needs:  . Film/video editor (Medical):  Marland Kitchen Lack of Transportation (Non-Medical):   PHYSICAL EXAM: Vitals:  02/23/20 0748  BP: 132/84  Pulse: 84   Body mass index is 30.99 kg/m. Weight: 74.4 kg (164 lb)   GENERAL: Alert, active, oriented x3  HEENT: Pupils equal reactive to light. Extraocular movements are intact. Sclera clear. Palpebral conjunctiva normal red color.Pharynx clear.  NECK: Supple with no palpable mass and no adenopathy.  LUNGS: Sound clear with no rales rhonchi or wheezes.  HEART: Regular rhythm S1 and S2 without murmur.  ABDOMEN: Soft and depressible, nontender with no palpable mass, no hepatomegaly. Tender to palpation in all quadrants.  Back: Tender to palpation throughout the whole back.  EXTREMITIES: Well-developed well-nourished symmetrical with no dependent edema.  NEUROLOGICAL: Awake alert oriented, facial expression symmetrical, moving all extremities.  REVIEW OF DATA: I have reviewed the following data today: Office Visit on 02/16/2020  Component Date Value  . Color 02/16/2020 Yellow  .  Clarity 02/16/2020 SL Cloudy*  . Specific Gravity 02/16/2020 1.025  . pH, Urine 02/16/2020 5.5  . Protein, Urinalysis 02/16/2020 Negative  . Glucose, Urinalysis 02/16/2020 >=1000*  . Ketones, Urinalysis 02/16/2020 Negative  . Blood, Urinalysis 02/16/2020 Negative  . Nitrite, Urinalysis 02/16/2020 Negative  . Leukocyte Esterase, Urin* 02/16/2020 Trace*  . White Blood Cells, Urina* 02/16/2020 4-10*  . Red Blood Cells, Urinaly* 02/16/2020 None Seen  . Bacteria, Urinalysis 02/16/2020 Rare*  . Squamous Epithelial Cell* 02/16/2020 Few  . Urine Culture, Routine -* 02/16/2020 Final report*  . Result 1 - [facility] 02/16/2020 Comment*  . Chlamydia trachomatis, P* 02/16/2020 Not Detected  . Neisseria gonorrhoeae, P* 02/16/2020 Not Detected  . Clue Cells, Vaginal 02/16/2020 None Seen  . WBC (White Blood Cells),* 02/16/2020 Few*  . Trichomonas, Vaginal 02/16/2020 None Seen  . Bacteria, Vaginal 02/16/2020 Few*  . Yeast, Vaginal 02/16/2020 None Seen    ASSESSMENT: Ms. Kibble  is a 49 y.o. female presenting for consultation for bilateral inguinal hernia.  Patient with a complete history of lower abdominal pelvic pain. She was evaluated by gynecology because her pain was initially described as a vaginal pain. I personally evaluated the chart reading the gynecologist evaluation and assessment. As per gynecology patient has significant tenderness on cervical evaluation of motion. Patient also have tenderness on both adnexa. I was unable to find any intervention for this pain. Patient was started on treatment for vaginal irritation.  I also evaluated discharge from the primary care physician. Patient has been treated for UTI. Patient has been referred to GI for the evaluation of the severe constipation and evaluation of possible recurrent rectocele. Patient with history of rectocele surgery. I think that patient needs further evaluation by GI due to the severe chronic constipation. Her constipation is so  severe that the patient needs to use her finger to be able to move her bowels. This is also causing complicated features. The problem with her constipation could be from a chronic idiopathic slow transit versus recurrent of that rectocele.  At this point after evaluation of the CT scan done at [facility] last week the patient has very small fat-containing inguinal hernias. I do not think that the inguinal hernias are the main cause of her pains. Patient had multiple other reasons that cause pelvic pain and back pain. I did discuss with her that we might consider inguinal hernia repair after she has complete evaluation of her older medical issues.  The patient has an appointment with her GI doctor at [facility] on 02/25/2020. I discussed with her to told him about the need of use of her finger and the difficulty with straining. She also needs to mention about the rectal bleeding. Patient might need a colonoscopy before any surgical repair of inguinal hernia.  Non-recurrent bilateral inguinal hernia without obstruction or gangrene [K40.20]  PLAN: 1. Agree with appointment with GI in two days for evaluation of rectal bleeding and severe constipation 2. Increase fiber in your diet  3. Will need a colonoscopy before proceeding any surgical management of hernia 4. Will see you in two weeks for follow up.   Patient and her husband verbalized understanding, all questions were answered, and were agreeable with the plan outlined above.

## 2020-03-10 NOTE — Op Note (Signed)
[facility] Gastroenterology Patient Name: Terri Woods Procedure Date: 03/10/2020 2:18 PM MRN: [id number] Account #: [id number] Date of Birth: 15-May-1970 Admit Type: Outpatient Age: 50 Room: [facility] ENDO ROOM 4 Gender: Female Note Status: Finalized Procedure:             Colonoscopy Indications:           Screening for colorectal malignant neoplasm Providers:             Eliseo Squires MD, MD Referring MD:          [facility] (Referring MD) Medicines:             Propofol per Anesthesia Complications:         No immediate complications. Procedure:             Pre-Anesthesia Assessment:                        - After reviewing the risks and benefits, the patient                         was deemed in satisfactory condition to undergo the                         procedure in an ambulatory setting.                        After obtaining informed consent, the colonoscope was                         passed under direct vision. Throughout the procedure,                         the patient's blood pressure, pulse, and oxygen                         saturations were monitored continuously. The                         Colonoscope was introduced through the anus and                         advanced to the the cecum, identified by the ileocecal                         valve. The colonoscopy was performed with difficulty                         due to unsatisfactory bowel prep. Findings:      portions of sigmoid unable to be adequately visualized due to stool       despite multiple attempts at lavage      Non-bleeding internal hemorrhoids were found during retroflexion. The       hemorrhoids were Grade I (internal hemorrhoids that do not prolapse).      Skin tags were found on perianal exam.      The exam was otherwise without abnormality. Impression:            - Non-bleeding internal hemorrhoids.                        -  Perianal skin tags found on perianal exam.                         - The examination was otherwise normal.                        - No specimens collected. Recommendation:        - Discharge patient to home.                        - Resume previous diet.                        - Written discharge instructions were provided to the                         patient.                        - Repeat colonoscopy after constipation issues                         addressed so unexamined portion of sigmoid can be seen. Procedure Code(s):     --- Professional ---                        [phone], Colonoscopy, flexible; diagnostic, including                         collection of specimen(s) by brushing or washing, when                         performed (separate procedure) Diagnosis Code(s):     --- Professional ---                        Z12.11, Encounter for screening for malignant neoplasm                         of colon                        K64.0, First degree hemorrhoids                        K64.4, Residual hemorrhoidal skin tags CPT copyright 2019 [employer]. All rights reserved. The codes documented in this report are preliminary and upon coder review may  be revised to meet current compliance requirements. Dr. Sheppard Penton, MD Eliseo Squires MD, MD 03/10/2020 3:09:37 PM This report has been signed electronically. Number of Addenda: 0 Note Initiated On: 03/10/2020 2:18 PM Scope Withdrawal Time: 0 hours 13 minutes 47 seconds  Total Procedure Duration: 0 hours 29 minutes 44 seconds  Estimated Blood Loss:  Estimated blood loss: none.      [facility]

## 2020-03-10 NOTE — Interval H&P Note (Signed)
History and Physical Interval Note:  03/10/2020 1:51 PM  Terri Woods  has presented today for surgery, with the diagnosis of screening.  The various methods of treatment have been discussed with the patient and family. After consideration of risks, benefits and other options for treatment, the patient has consented to  Procedure(s): COLONOSCOPY WITH PROPOFOL (N/A) as a surgical intervention.  The patient's history has been reviewed, patient examined, no change in status, stable for surgery.  I have reviewed the patient's chart and labs.  Questions were answered to the patient's satisfaction.     Arminda Foglio Lysle Pearl

## 2020-03-14 ENCOUNTER — Encounter: Payer: Self-pay | Admitting: Surgery

## 2020-05-10 ENCOUNTER — Ambulatory Visit
Admission: EM | Admit: 2020-05-10 | Discharge: 2020-05-10 | Disposition: A | Discharge status: Home / Self Care | Payer: Medicare Other | Attending: Emergency Medicine | Admitting: Emergency Medicine

## 2020-05-10 ENCOUNTER — Other Ambulatory Visit: Payer: Self-pay

## 2020-05-10 DIAGNOSIS — Z881 Allergy status to other antibiotic agents status: Secondary | ICD-10-CM | POA: Diagnosis not present

## 2020-05-10 DIAGNOSIS — T887XXA Unspecified adverse effect of drug or medicament, initial encounter: Secondary | ICD-10-CM | POA: Diagnosis not present

## 2020-05-10 DIAGNOSIS — Z880 Allergy status to penicillin: Secondary | ICD-10-CM | POA: Insufficient documentation

## 2020-05-10 DIAGNOSIS — Z794 Long term (current) use of insulin: Secondary | ICD-10-CM | POA: Diagnosis not present

## 2020-05-10 DIAGNOSIS — M549 Dorsalgia, unspecified: Secondary | ICD-10-CM | POA: Diagnosis not present

## 2020-05-10 DIAGNOSIS — K5090 Crohn's disease, unspecified, without complications: Secondary | ICD-10-CM | POA: Insufficient documentation

## 2020-05-10 DIAGNOSIS — R109 Unspecified abdominal pain: Secondary | ICD-10-CM | POA: Insufficient documentation

## 2020-05-10 DIAGNOSIS — R112 Nausea with vomiting, unspecified: Secondary | ICD-10-CM | POA: Diagnosis not present

## 2020-05-10 DIAGNOSIS — J45909 Unspecified asthma, uncomplicated: Secondary | ICD-10-CM | POA: Diagnosis not present

## 2020-05-10 DIAGNOSIS — Z79899 Other long term (current) drug therapy: Secondary | ICD-10-CM | POA: Insufficient documentation

## 2020-05-10 DIAGNOSIS — K219 Gastro-esophageal reflux disease without esophagitis: Secondary | ICD-10-CM | POA: Diagnosis not present

## 2020-05-10 DIAGNOSIS — G8929 Other chronic pain: Secondary | ICD-10-CM | POA: Insufficient documentation

## 2020-05-10 DIAGNOSIS — E119 Type 2 diabetes mellitus without complications: Secondary | ICD-10-CM | POA: Diagnosis not present

## 2020-05-10 DIAGNOSIS — Z20822 Contact with and (suspected) exposure to covid-19: Secondary | ICD-10-CM | POA: Insufficient documentation

## 2020-05-10 DIAGNOSIS — I10 Essential (primary) hypertension: Secondary | ICD-10-CM | POA: Insufficient documentation

## 2020-05-10 LAB — URINALYSIS, COMPLETE (UACMP) WITH MICROSCOPIC
Bilirubin Urine: NEGATIVE
Glucose, UA: 500 mg/dL — AB
Hgb urine dipstick: NEGATIVE
Ketones, ur: NEGATIVE mg/dL
Nitrite: NEGATIVE
Protein, ur: NEGATIVE mg/dL
RBC / HPF: NONE SEEN RBC/hpf (ref 0–5)
Specific Gravity, Urine: 1.01 (ref 1.005–1.030)
WBC, UA: >50 WBC/hpf (ref 0–5)
pH: 5.5 (ref 5.0–8.0)

## 2020-05-10 LAB — RESP PANEL BY RT-PCR (FLU A&B, COVID) ARPGX2
Influenza A by PCR: NEGATIVE
Influenza B by PCR: NEGATIVE
SARS Coronavirus 2 by RT PCR: NEGATIVE

## 2020-05-10 NOTE — ED Triage Notes (Signed)
Pt is here with nausea after taking a shot of Trulicity on Sunday, pt has been nausea ever since.

## 2020-05-10 NOTE — ED Provider Notes (Signed)
[facility]    CSN: [id number] Arrival date & time: 05/10/20  [id number]      History   Chief Complaint Chief Complaint  Patient presents with  . Nausea    HPI Terri Woods is a 49 y.o. female presenting for severe nausea and vomiting as well as dry heaving for the past 2 days.  Patient states that she started Trulicity on Saturday and started to have symptoms immediately after.  Patient states that she was prescribed Zofran by her PCP, which she picked up today.  She says that the Zofran helped her nausea and vomiting, but she has started to have severe abdominal pain.  Patient states that the pain is in the right lower quadrant.  She also admits to umbilical pain that radiates to the back.  She says that her pain is worse when she moves.  Admits to pain when she goes from a laying to standing or sitting up position.  She says pain of the right lower quadrant is 9 out of 10.  Patient denies any fever.  Admits to fatigue and feeling weak.  She denies any diarrhea.  Admits to a decreased appetite.  Denies any fevers.  Patient denies any Covid-like symptoms.  Denies any cough, congestion, sore throat.  She denies any urinary symptoms.  Past medical history is significant for chronic back pain, Crohn's disease, diabetes, GERD, hypertension, and asthma.  Patient has no other complaints or concerns today.  HPI  Past Medical History:  Diagnosis Date  . Arthritis    neck, hips, fingers  . Asthma   . Back pain   . Breast cancer ([place]) 2001   RT LUMPECTOMY PER PT  . Carpal tunnel syndrome    right arm  . Crohn's disease ([place])   . Diabetes ([employer])   . Fibromyalgia   . GERD (gastroesophageal reflux disease)   . Glaucoma   . Headache   . Hypertension     There are no problems to display for this patient.   Past Surgical History:  Procedure Laterality Date  . BILATERAL CARPAL TUNNEL RELEASE Bilateral 02/05/2019   Procedure: BILATERAL CARPAL TUNNEL RELEASE;  Surgeon: Hessie Knows, MD;  Location: [facility] ORS;  Service: Orthopedics;  Laterality: Bilateral;  . BREAST EXCISIONAL BIOPSY Right 15+ yrs ago   NEG  . BREAST SURGERY    . COLONOSCOPY WITH PROPOFOL N/A 03/10/2020   Procedure: COLONOSCOPY WITH PROPOFOL;  Surgeon: Benjamine Sprague, DO;  Location: [facility] END[facility]COPY;  Service: General;  Laterality: N/A;  . FOOT SURGERY Left   . LYMPH NODE BIOPSY    . NASAL SEPTOPLASTY W/ TURBINOPLASTY Bilateral 12/25/2018   Procedure: NASAL SEPTOPLASTY WITH INFERIOR TURBINATE REDUCTION;  Surgeon: Margaretha Sheffield, MD;  Location: [place];  Service: ENT;  Laterality: Bilateral;  . SHOULDER SURGERY    . SMALL INTESTINE SURGERY      OB History   No obstetric history on file.      Home Medications    Prior to Admission medications   Medication Sig Start Date End Date Taking? Authorizing Provider  Dulaglutide 0.75 MG/0.5ML SOPN Inject into the skin. 04/27/20  Yes [provider]  albuterol (VENTOLIN HFA) 108 (90 Base) MCG/ACT inhaler Inhale 2 puffs into the lungs every 6 (six) hours as needed for wheezing or shortness of breath.    [provider]  atorvastatin (LIPITOR) 20 MG tablet Take 20 mg by mouth daily at 6 PM.    [provider]  bimatoprost (LUMIGAN) 0.01 %  SOLN Place 1 drop into both eyes at bedtime.    [provider]  brimonidine (ALPHAGAN) 0.2 % ophthalmic solution Place 2 drops into both eyes 2 (two) times daily.  Patient not taking: Reported on 03/10/2020    [provider]  clonazePAM (KLONOPIN) 1 MG tablet Take 1 mg by mouth 2 (two) times daily.     [provider]  docusate sodium (COLACE) 100 MG capsule Take 100 mg by mouth 2 (two) times daily as needed for mild constipation.    [provider]  DULoxetine (CYMBALTA) 60 MG capsule Take 60 mg by mouth at bedtime.    [provider]  EPINEPHrine 0.3 mg/0.3 mL IJ SOAJ injection Inject 0.3 mg into the muscle as needed for anaphylaxis.      [provider]  ergocalciferol (VITAMIN D2) 1.25 MG (50000 UT) capsule Take 50,000 Units by mouth See admin instructions. Take 1 capsule (50000 units) by mouth once a week or once every other week    [provider]  glipiZIDE (GLUCOTROL) 5 MG tablet Take 10 mg by mouth 2 (two) times daily.    [provider]  ibuprofen (ADVIL) 800 MG tablet Take 800 mg by mouth every 8 (eight) hours as needed (pain.).    [provider]  insulin glargine (LANTUS) 100 UNIT/ML injection Inject 20 Units into the skin at bedtime.     [provider]  ketoconazole (NIZORAL) 2 % shampoo Apply 1 application topically 2 (two) times a week.    [provider]  lidocaine (XYLOCAINE) 5 % ointment Apply 1 application topically as needed (pain.).     [provider]  lisinopril (ZESTRIL) 2.5 MG tablet Take 2.5 mg by mouth every evening.    [provider]  miconazole (MICOTIN) 2 % cream Apply 1 application topically as needed (skin irritation/rash).  Patient not taking: Reported on 03/10/2020    [provider]  naloxone [facility]) nasal spray 4 mg/0.1 mL Place 1 spray into the nose as needed (accidental overdose). Patient not taking: Reported on 03/10/2020    [provider]  omeprazole (PRILOSEC) 20 MG capsule Take 20 mg by mouth at bedtime.    [provider]  Oxycodone HCl 10 MG TABS Take 10 mg by mouth 3 (three) times daily.    [provider]  oxyCODONE-acetaminophen (PERCOCET/ROXICET) 5-325 MG tablet Take 1 tablet by mouth every 4 (four) hours as needed (pain.).     [provider]  QUEtiapine (SEROQUEL) 300 MG tablet Take 300 mg by mouth at bedtime.    [provider]  SUMAtriptan (IMITREX) 100 MG tablet Take 100 mg by mouth every 2 (two) hours as needed for migraine. May repeat in 2 hours if headache persists or recurs.    [provider]  tiZANidine (ZANAFLEX) 4 MG tablet Take 1 tablet  (4 mg total) by mouth every 8 (eight) hours as needed for muscle spasms. 03/02/20   Coral Spikes, DO  triamcinolone (NASACORT ALLERGY 24HR) 55 MCG/ACT AERO nasal inhaler Place 2 sprays into the nose daily.    [provider]  triamcinolone cream (KENALOG) 0.1 % Apply 1 application topically 2 (two) times daily. 08/31/19   Norval Gable, MD  topiramate (TOPAMAX) 100 MG tablet Take 100 mg by mouth 2 (two) times daily.  01/06/20  [provider]    Family History Family History  Problem Relation Age of Onset  . Heart attack Sister 56  . Heart disease Father   .  Breast cancer Mother   . Breast cancer Maternal Aunt     Social History Social History   Tobacco Use  . Smoking status: Never Smoker  . Smokeless tobacco: Never Used  Vaping Use  . Vaping Use: Never used  Substance Use Topics  . Alcohol use: No    Alcohol/week: 0.0 standard drinks  . Drug use: Yes    Types: Oxycodone     Allergies   Levodopa, Rotigotine, Erythromycin, Bee venom, Doxycycline, Penicillins, and Shellfish allergy   Review of Systems Review of Systems  Constitutional: Positive for fatigue. Negative for chills, diaphoresis and fever.  HENT: Negative for congestion, rhinorrhea and sore throat.   Respiratory: Negative for cough and shortness of breath.   Cardiovascular: Negative for chest pain.  Gastrointestinal: Positive for abdominal pain, nausea and vomiting. Negative for constipation and diarrhea.  Genitourinary: Negative for difficulty urinating, dysuria, frequency and hematuria.  Musculoskeletal: Positive for back pain. Negative for arthralgias and myalgias.  Skin: Negative for rash.  Neurological: Positive for weakness. Negative for headaches.     Physical Exam Triage Vital Signs ED Triage Vitals [05/10/20 1119]  Enc Vitals Group     BP 135/89     Pulse Rate 92     Resp 19     Temp 98 F (36.7 C)     Temp Source Oral     SpO2 98 %     Weight      Height      Head  Circumference      Peak Flow      Pain Score      Pain Loc      Pain Edu?      Excl. in GC?    No data found.  Updated Vital Signs BP 135/89 (BP Location: Left Arm)   Pulse 92   Temp 98 F (36.7 C) (Oral)   Resp 19   SpO2 98%       Physical Exam Vitals and nursing note reviewed.  Constitutional:      General: She is not in acute distress.    Appearance: Normal appearance. She is not ill-appearing or toxic-appearing.  HENT:     Head: Normocephalic and atraumatic.     Nose: Nose normal.     Mouth/Throat:     Mouth: Mucous membranes are moist.     Pharynx: Oropharynx is clear.  Eyes:     General: No scleral icterus.       Right eye: No discharge.        Left eye: No discharge.     Conjunctiva/sclera: Conjunctivae normal.  Cardiovascular:     Rate and Rhythm: Normal rate and regular rhythm.     Heart sounds: Normal heart sounds.  Pulmonary:     Effort: Pulmonary effort is normal. No respiratory distress.     Breath sounds: Normal breath sounds.  Abdominal:     General: Bowel sounds are normal.     Palpations: Abdomen is soft.     Tenderness: There is abdominal tenderness in the right upper quadrant, right lower quadrant, epigastric area and periumbilical area. There is right CVA tenderness and guarding (RLQ, RUQ). There is no left CVA tenderness. Positive signs include Murphy's sign, McBurney's sign, psoas sign and obturator sign.  Musculoskeletal:     Cervical back: Neck supple.  Skin:    General: Skin is dry.  Neurological:     General: No focal deficit present.     Mental Status: She is alert. Mental  status is at baseline.     Motor: No weakness.     Gait: Gait normal.  Psychiatric:        Mood and Affect: Mood normal.        Behavior: Behavior normal.        Thought Content: Thought content normal.      UC Treatments / Results  Labs (all labs ordered are listed, but only abnormal results are displayed) Labs Reviewed  URINALYSIS, COMPLETE (UACMP) WITH  MICROSCOPIC - Abnormal; Notable for the following components:      Result Value   APPearance HAZY (*)    Glucose, UA 500 (*)    Leukocytes,Ua MODERATE (*)    Bacteria, UA FEW (*)    All other components within normal limits  RESP PANEL BY RT-PCR (FLU A&B, COVID) [id number]    EKG   Radiology No results found.  Procedures Procedures (including critical care time)  Medications Ordered in UC Medications - No data to display  Initial Impression / Assessment and Plan / UC Course  I have reviewed the triage vital signs and the nursing notes.  Pertinent labs & imaging results that were available during my care of the patient were reviewed by me and considered in my medical decision making (see chart for details).   49 year old female presenting for nausea and vomiting for the past couple days with new onset of severe abdominal pain.  All vital signs are normal and stable.  She does appear to be in pain.  On exam, she has tenderness of the right lower quadrant and right upper quadrant significantly.  Has positive psoas and obturator signs as well as positive Murphy sign.  Patient does have right CVA tenderness as well.  Patient advised immediately that she needs work-up in the ED including a CT scan to rule out potential appendicitis, cholecystitis, and pancreatitis.  I spoke with her about Trulicity and its effects which could cause inflammation of the pancreas or gallbladder.  Advised ED work-up immediately.  Patient states that her husband will take her at this time.  Urinalysis shows 500 glucose and moderate leukocytes. Respiratory panel also obtained.  Patient leaving to [facility] ED by private vehicle in stable condition.  Final Clinical Impressions(s) / UC Diagnoses   Final diagnoses:  Continuous severe abdominal pain  Non-intractable vomiting with nausea, unspecified vomiting type  Medication side effect     Discharge Instructions     You have been advised to follow  up immediately in the emergency department for concerning signs.symptoms. If you declined EMS transport, please have a family member take you directly to the ED at this time. Do not delay. Based on concerns about condition, if you do not follow up in th e ED, you may risk poor outcomes including worsening of condition, delayed treatment and potentially life threatening issues. If you have declined to go to the ED at this time, you should call your PCP immediately to set up a follow up appointment.  Go to ED for red flag symptoms, including; fevers you cannot reduce with Tylenol/Motrin, severe headaches, vision changes, numbness/weakness in part of the body, lethargy, confusion, intractable vomiting, severe dehydration, chest pain, breathing difficulty, severe persistent abdominal or pelvic pain, signs of severe infection (increased redness, swelling of an area), feeling faint or passing out, dizziness, etc. You should especially go to the ED for sudden acute worsening of condition if you do not elect to go at this time.     ED Prescriptions  None     PDMP not reviewed this encounter.   Danton Clap, PA-C 05/10/20 1156

## 2020-05-10 NOTE — Discharge Instructions (Addendum)

## 2020-05-14 ENCOUNTER — Encounter: Payer: Self-pay | Admitting: Emergency Medicine

## 2020-05-14 ENCOUNTER — Emergency Department
Admission: EM | Admit: 2020-05-14 | Discharge: 2020-05-14 | Disposition: A | Discharge status: Home / Self Care | Payer: Medicare Other | Attending: Emergency Medicine | Admitting: Emergency Medicine

## 2020-05-14 ENCOUNTER — Other Ambulatory Visit: Payer: Self-pay

## 2020-05-14 DIAGNOSIS — Z5321 Procedure and treatment not carried out due to patient leaving prior to being seen by health care provider: Secondary | ICD-10-CM | POA: Diagnosis not present

## 2020-05-14 DIAGNOSIS — R1031 Right lower quadrant pain: Secondary | ICD-10-CM | POA: Insufficient documentation

## 2020-05-14 LAB — COMPREHENSIVE METABOLIC PANEL
ALT: 28 U/L (ref 0–44)
AST: 27 U/L (ref 15–41)
Albumin: 4 g/dL (ref 3.5–5.0)
Alkaline Phosphatase: 79 U/L (ref 38–126)
Anion gap: 11 (ref 5–15)
BUN: 16 mg/dL (ref 6–20)
CO2: 25 mmol/L (ref 22–32)
Calcium: 9.1 mg/dL (ref 8.9–10.3)
Chloride: 101 mmol/L (ref 98–111)
Creatinine, Ser: 0.65 mg/dL (ref 0.44–1.00)
GFR, Estimated: >60 mL/min (ref >60)
Glucose, Bld: 99 mg/dL (ref 70–99)
Potassium: 3.8 mmol/L (ref 3.5–5.1)
Sodium: 137 mmol/L (ref 135–145)
Total Bilirubin: 0.9 mg/dL (ref 0.3–1.2)
Total Protein: 7.7 g/dL (ref 6.5–8.1)

## 2020-05-14 LAB — CBC
HCT: 39.9 % (ref 36.0–46.0)
Hemoglobin: 13.4 g/dL (ref 12.0–15.0)
MCH: 28.4 pg (ref 26.0–34.0)
MCHC: 33.6 g/dL (ref 30.0–36.0)
MCV: 84.5 fL (ref 80.0–100.0)
Platelets: 175 10*3/uL (ref 150–400)
RBC: 4.72 MIL/uL (ref 3.87–5.11)
RDW: 13.7 % (ref 11.5–15.5)
WBC: 7.9 10*3/uL (ref 4.0–10.5)
nRBC: 0 % (ref 0.0–0.2)

## 2020-05-14 LAB — TROPONIN I (HIGH SENSITIVITY): Troponin I (High Sensitivity): 3 ng/L (ref <18)

## 2020-05-14 LAB — LIPASE, BLOOD: Lipase: 31 U/L (ref 11–51)

## 2020-05-14 MED ORDER — ONDANSETRON 4 MG PO TBDP
4.0000 mg | ORAL_TABLET | Freq: Once | ORAL | Status: AC | PRN
  Administered 2020-05-14: 4 mg via ORAL
  Filled 2020-05-14: qty 1

## 2020-05-14 NOTE — ED Notes (Signed)
Patient ask about the wait, informed patient that there were still several people in front of her, verbalized understanding.

## 2020-05-14 NOTE — ED Triage Notes (Signed)
Pt to ED via [facility] with c/o RLQ abdominal pain, per EMS pt was seen at [facility] on Monday and told to go to ED immediately due to having appendicitis. Per EMS pt was seen at [facility] ED, waited approx 30 mins and then LWBS from [facility].    148/72 80HR CBG 85 98.7oral

## 2020-05-14 NOTE — ED Triage Notes (Addendum)
Pt arrived via [facility] from home with reports of RUQ abd pain for several days, pt states she was seen at [facility] on 12/28 and was referred to ED and went to [facility] but did not stay there due to wait times.  Pt c/o dizziness, vomiting and weakness.  Pt is alert and oriented at this time.

## 2020-05-27 ENCOUNTER — Ambulatory Visit
Admission: EM | Admit: 2020-05-27 | Discharge: 2020-05-27 | Disposition: A | Discharge status: Home / Self Care | Payer: Medicare Other | Attending: Emergency Medicine | Admitting: Emergency Medicine

## 2020-05-27 ENCOUNTER — Ambulatory Visit (INDEPENDENT_AMBULATORY_CARE_PROVIDER_SITE_OTHER): Payer: Medicare Other

## 2020-05-27 ENCOUNTER — Other Ambulatory Visit: Payer: Self-pay

## 2020-05-27 ENCOUNTER — Encounter: Payer: Self-pay | Admitting: Emergency Medicine

## 2020-05-27 DIAGNOSIS — M25512 Pain in left shoulder: Secondary | ICD-10-CM

## 2020-05-27 DIAGNOSIS — S20212A Contusion of left front wall of thorax, initial encounter: Secondary | ICD-10-CM

## 2020-05-27 DIAGNOSIS — S42002A Fracture of unspecified part of left clavicle, initial encounter for closed fracture: Secondary | ICD-10-CM

## 2020-05-27 MED ORDER — ONDANSETRON 8 MG PO TBDP: 8.0000 mg | ORAL_TABLET | Freq: Three times a day (TID) | ORAL | 0 refills | Status: DC | PRN

## 2020-05-27 MED ORDER — KETOROLAC TROMETHAMINE 10 MG PO TABS: 10.0000 mg | ORAL_TABLET | Freq: Four times a day (QID) | ORAL | 0 refills | Status: DC | PRN

## 2020-05-27 MED ORDER — ONDANSETRON 8 MG PO TBDP
8.0000 mg | ORAL_TABLET | Freq: Once | ORAL | Status: AC
  Administered 2020-05-27: 8 mg via ORAL

## 2020-05-27 MED ORDER — IBUPROFEN 600 MG PO TABS
600.0000 mg | ORAL_TABLET | Freq: Once | ORAL | Status: AC
  Administered 2020-05-27: 600 mg via ORAL
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
## 2020-05-27 NOTE — ED Provider Notes (Signed)
[facility]    CSN: [id number] Arrival date & time: 05/27/20  1409      History   Chief Complaint Chief Complaint  Patient presents with  . Shoulder Injury  . Rib Injury    HPI Terri Woods is a 50 y.o. female.   HPI   50 year old female here for evaluation of left shoulder and rib pain after being thrown off of a horse. Patient reports that she did not hit her head and did not have a loss of consciousness. She was thrown off onto a muddy surface. Patient was able to get up after the accident and is able to walk. Patient is complaining of pain in her left shoulder, left collarbone, left scapula, and left ribs. Patient is also complaining of nausea.  Past Medical History:  Diagnosis Date  . Arthritis    neck, hips, fingers  . Asthma   . Back pain   . Breast cancer ([place]) 2001   RT LUMPECTOMY PER PT  . Carpal tunnel syndrome    right arm  . Crohn's disease ([place])   . Diabetes ([place])   . Fibromyalgia   . GERD (gastroesophageal reflux disease)   . Glaucoma   . Headache   . Hypertension     There are no problems to display for this patient.   Past Surgical History:  Procedure Laterality Date  . BILATERAL CARPAL TUNNEL RELEASE Bilateral 02/05/2019   Procedure: BILATERAL CARPAL TUNNEL RELEASE;  Surgeon: Hessie Knows, MD;  Location: [facility];  Service: Orthopedics;  Laterality: Bilateral;  . BREAST EXCISIONAL BIOPSY Right 15+ yrs ago   NEG  . BREAST SURGERY    . COLONOSCOPY WITH PROPOFOL N/A 03/10/2020   Procedure: COLONOSCOPY WITH PROPOFOL;  Surgeon: Benjamine Sprague, DO;  Location: [facility];  Service: General;  Laterality: N/A;  . FOOT SURGERY Left   . LYMPH NODE BIOPSY    . NASAL SEPTOPLASTY W/ TURBINOPLASTY Bilateral 12/25/2018   Procedure: NASAL SEPTOPLASTY WITH INFERIOR TURBINATE REDUCTION;  Surgeon: Margaretha Sheffield, MD;  Location: [facility];  Service: ENT;  Laterality: Bilateral;  . SHOULDER SURGERY    . SMALL INTESTINE SURGERY      OB  History   No obstetric history on file.      Home Medications    Prior to Admission medications   Medication Sig Start Date End Date Taking? Authorizing Provider  albuterol (VENTOLIN HFA) 108 (90 Base) MCG/ACT inhaler Inhale 2 puffs into the lungs every 6 (six) hours as needed for wheezing or shortness of breath.   Yes [provider]  atorvastatin (LIPITOR) 20 MG tablet Take 20 mg by mouth daily at 6 PM.   Yes [provider]  bimatoprost (LUMIGAN) 0.01 % SOLN Place 1 drop into both eyes at bedtime.   Yes [provider]  clonazePAM (KLONOPIN) 1 MG tablet Take 1 mg by mouth 2 (two) times daily.   Yes [provider]  docusate sodium (COLACE) 100 MG capsule Take 100 mg by mouth 2 (two) times daily as needed for mild constipation.   Yes [provider]  Dulaglutide 0.75 MG/0.5ML SOPN Inject into the skin. 04/27/20  Yes [provider]  DULoxetine (CYMBALTA) 60 MG capsule Take 60 mg by mouth at bedtime.   Yes [provider]  EPINEPHrine 0.3 mg/0.3 mL IJ SOAJ injection Inject 0.3 mg into the muscle as needed for anaphylaxis.    Yes [provider]  ergocalciferol (VITAMIN D2) 1.25 MG (50000 UT) capsule Take  50,000 Units by mouth See admin instructions. Take 1 capsule (50000 units) by mouth once a week or once every other week   Yes [provider]  glipiZIDE (GLUCOTROL) 5 MG tablet Take 10 mg by mouth 2 (two) times daily.   Yes [provider]  ibuprofen (ADVIL) 800 MG tablet Take 800 mg by mouth every 8 (eight) hours as needed (pain.).   Yes [provider]  insulin glargine (LANTUS) 100 UNIT/ML injection Inject 20 Units into the skin at bedtime.    Yes [provider]  ketoconazole (NIZORAL) 2 % shampoo Apply 1 application topically 2 (two) times a week.   Yes [provider]  ketorolac (TORADOL) 10 MG tablet Take 1 tablet (10 mg total) by mouth every 6 (six) hours as needed.  05/27/20  Yes Becky Augusta, NP  lidocaine (XYLOCAINE) 5 % ointment Apply 1 application topically as needed (pain.).    Yes [provider]  lisinopril (ZESTRIL) 2.5 MG tablet Take 2.5 mg by mouth every evening.   Yes [provider]  miconazole (MICOTIN) 2 % cream Apply 1 application topically as needed (skin irritation/rash).   Yes [provider]  naloxone (NARCAN) nasal spray 4 mg/0.1 mL Place 1 spray into the nose as needed (accidental overdose).   Yes [provider]  omeprazole (PRILOSEC) 20 MG capsule Take 20 mg by mouth at bedtime.   Yes [provider]  ondansetron (ZOFRAN ODT) 8 MG disintegrating tablet Take 1 tablet (8 mg total) by mouth every 8 (eight) hours as needed for nausea or vomiting. 05/27/20  Yes Becky Augusta, NP  Oxycodone HCl 10 MG TABS Take 10 mg by mouth 3 (three) times daily.   Yes [provider]  oxyCODONE-acetaminophen (PERCOCET/ROXICET) 5-325 MG tablet Take 1 tablet by mouth every 4 (four) hours as needed (pain.).    Yes [provider]  QUEtiapine (SEROQUEL) 300 MG tablet Take 300 mg by mouth at bedtime.   Yes [provider]  SUMAtriptan (IMITREX) 100 MG tablet Take 100 mg by mouth every 2 (two) hours as needed for migraine. May repeat in 2 hours if headache persists or recurs.   Yes [provider]  tiZANidine (ZANAFLEX) 4 MG tablet Take 1 tablet (4 mg total) by mouth every 8 (eight) hours as needed for muscle spasms. 03/02/20  Yes Cook, Jayce G, DO  triamcinolone (NASACORT) 55 MCG/ACT AERO nasal inhaler Place 2 sprays into the nose daily.   Yes [provider]  triamcinolone cream (KENALOG) 0.1 % Apply 1 application topically 2 (two) times daily. 08/31/19  Yes Payton Mccallum, MD  topiramate (TOPAMAX) 100 MG tablet Take 100 mg by mouth 2 (two) times daily.  01/06/20  [provider]    Family History Family History  Problem Relation Age of Onset  . Heart attack Sister 30   . Heart disease Father   . Breast cancer Mother   . Breast cancer Maternal Aunt     Social History Social History   Tobacco Use  . Smoking status: Never Smoker  . Smokeless tobacco: Never Used  Vaping Use  . Vaping Use: Never used  Substance Use Topics  . Alcohol use: No    Alcohol/week: 0.0 standard drinks  . Drug use: Yes    Types: Oxycodone     Allergies   Levodopa, Rotigotine, Erythromycin, Bee venom, Doxycycline, Penicillins, and Shellfish allergy   Review of Systems Review of Systems  Constitutional: Negative for activity change, appetite change and  fever.  Musculoskeletal: Positive for arthralgias, back pain and myalgias. Negative for joint swelling and neck pain.  Skin: Negative for color change and rash.  Neurological: Negative for weakness and numbness.  Hematological: Negative.   Psychiatric/Behavioral: Negative.      Physical Exam Triage Vital Signs ED Triage Vitals  Enc Vitals Group     BP      Pulse      Resp      Temp      Temp src      SpO2      Weight      Height      Head Circumference      Peak Flow      Pain Score      Pain Loc      Pain Edu?      Excl. in [place]?    No data found.  Updated Vital Signs BP 131/87 (BP Location: Right Arm)   Pulse 80   Temp 98.1 F (36.7 C) (Oral)   Resp 18   Ht 5\' 1"  (1.549 m)   Wt 154 lb 15.7 oz (70.3 kg)   SpO2 98%   BMI 29.28 kg/m   Visual Acuity Right Eye Distance:   Left Eye Distance:   Bilateral Distance:    Right Eye Near:   Left Eye Near:    Bilateral Near:     Physical Exam Vitals and nursing note reviewed.  Constitutional:      General: She is not in acute distress.    Appearance: Normal appearance. She is normal weight. She is not toxic-appearing.  HENT:     Head: Normocephalic and atraumatic.  Cardiovascular:     Rate and Rhythm: Normal rate and regular rhythm.     Pulses: Normal pulses.     Heart sounds: Normal heart sounds.  Pulmonary:     Effort: Pulmonary effort  is normal.     Breath sounds: Normal breath sounds. No wheezing, rhonchi or rales.  Musculoskeletal:        General: Tenderness present. No swelling or deformity.  Skin:    General: Skin is warm and dry.     Capillary Refill: Capillary refill takes less than 2 seconds.     Findings: No bruising, erythema or rash.  Neurological:     General: No focal deficit present.     Mental Status: She is alert and oriented to person, place, and time.  Psychiatric:        Mood and Affect: Mood normal.        Thought Content: Thought content normal.        Judgment: Judgment normal.      UC Treatments / Results  Labs (all labs ordered are listed, but only abnormal results are displayed) Labs Reviewed - No data to display  EKG   Radiology DG Chest 2 View  Result Date: 05/27/2020 CLINICAL DATA:  Left shoulder and clavicle pain after being thrown from a horse today. EXAM: CHEST - 2 VIEW COMPARISON:  Chest radiographs 03/11/2014.  CT 06/08/2012. FINDINGS: The heart size and mediastinal contours are stable without evidence of mediastinal hematoma. There are lower lung volumes with mild bibasilar atelectasis and probable underlying central airway thickening. There is no pleural effusion or pneumothorax. Mildly displaced acute fracture of the mid left clavicle is noted. No definite acute rib fractures identified. IMPRESSION: Mildly displaced acute fracture of the mid left clavicle. No evidence of mediastinal hematoma or pneumothorax. Electronically Signed   By: Gwyndolyn Saxon  Lin Landsman M.D.   On: 05/27/2020 16:32   DG Clavicle Left  Result Date: 05/27/2020 CLINICAL DATA:  Left shoulder and clavicle pain after being thrown from a horse today. EXAM: LEFT CLAVICLE - 2+ VIEWS COMPARISON:  Radiographs 03/02/2020. FINDINGS: There is an acute fracture of the mid left clavicle with 1 shaft with of inferior displacement. On the prior shoulder radiographs, there was mild medial displacement as well. The sternoclavicular  and acromioclavicular joints are intact. IMPRESSION: Mildly displaced mid left clavicle fracture. Electronically Signed   By: Richardean Sale M.D.   On: 05/27/2020 16:31   DG Shoulder Left  Result Date: 05/27/2020 CLINICAL DATA:  Left shoulder and clavicle pain after being thrown from a horse today. EXAM: LEFT SHOULDER - 2+ VIEW COMPARISON:  Left clavicle radiographs 03/02/2020. FINDINGS: There is an inferiorly and medially displaced transverse fracture through the mid left clavicle. There is no evidence of proximal humeral fracture or dislocation. The left clavicle appears intact. Mild acromioclavicular degenerative changes are present. IMPRESSION: Displaced transverse fracture of the mid left clavicle. Electronically Signed   By: Richardean Sale M.D.   On: 05/27/2020 16:29    Procedures Procedures (including critical care time)  Medications Ordered in UC Medications  ondansetron (ZOFRAN-ODT) disintegrating tablet 8 mg (8 mg Oral Given 05/27/20 1553)  ibuprofen (ADVIL) tablet 600 mg (600 mg Oral Given 05/27/20 1633)    Initial Impression / Assessment and Plan / UC Course  I have reviewed the triage vital signs and the nursing notes.  Pertinent labs & imaging results that were available during my care of the patient were reviewed by me and considered in my medical decision making (see chart for details).   Patient is here for evaluation of multiple orthopedic injuries after being thrown from her horse this afternoon. Patient patient reports a history of pinched nerves in her back and neck but states that riding the horse doesn't aggravate these. Patient denies loss of consciousness. Palpation of bony spinous prominences of cervical spine are not tender. She does have mild left-sided paraspinous tenderness at the level of C7. Spinous processes of thoracic spine are also nontender. Patient does have left and right paraspinous tenderness at the level of T6-T7. Patient also has tenderness to the  inferior angle of the left scapula. Patient also has tenderness to the proximal aspect of the left clavicle. There is no ecchymosis or erythema overlying any area of tenderness. There is no crepitus to palpation. Radial and ulnar pulses in the left arm are 2+. Patient has no tenderness with range of motion of her elbow or bony tenderness of the medial lateral condyle. Patient also does not have any tenderness of the left humerus. Lung sounds are clear to auscultation in all fields. Patient does have tenderness when palpating the left lower lateral rib cage without crepitus. Will obtain left clavicle film, chest x-ray, and left shoulder film. Will give Zofran for nausea.  Chest x-ray independently interpreted by me.  Interpretation: No infiltrates or consolidation noted on chest x-ray.  Patient does have a fracture of her left mid clavicle.  It is also displaced inferiorly and proximally.  No retractions noted on exam.  Clavicle x-ray interpretation: Patient's left clavicle is fractured midshaft and displaced inferiorly.  Left shoulder film interpreted by me.  Interpretation: The proximal humerus and glenohumeral joint are intact.  No dislocation appreciated.  Radiology over reads pending on all films.  Radiology over read, signs with my interpretation.  Will discharge patient home with Zofran for  nausea, sling, and have her follow-up with orthopedics for further evaluation of her clavicle fracture.   Final Clinical Impressions(s) / UC Diagnoses   Final diagnoses:  Fracture of unspecified part of left clavicle, initial encounter for closed fracture  Contusion of ribs, left, initial encounter     Discharge Instructions     Wear the sling to support your left arm while you are collarbone heals.  Use the oxycodone that you are prescribed at home for severe pain and take the Toradol every 6 hours as needed for mild to moderate pain.  You can use the Zofran every 8 hours as needed for  nausea.  Follow-up with orthopedics next week with regards to your collarbone fracture.  I would recommend calling [facility] or [facility].  You can also access you into see orthopedics through their [place] [facility] walk-in urgent care locations.    ED Prescriptions    Medication Sig Dispense Auth. Provider   ondansetron (ZOFRAN ODT) 8 MG disintegrating tablet Take 1 tablet (8 mg total) by mouth every 8 (eight) hours as needed for nausea or vomiting. 20 tablet Margarette Canada, NP   ketorolac (TORADOL) 10 MG tablet Take 1 tablet (10 mg total) by mouth every 6 (six) hours as needed. 20 tablet Margarette Canada, NP     PDMP not reviewed this encounter.   Margarette Canada, NP 05/27/20 1640

## 2020-07-18 ENCOUNTER — Other Ambulatory Visit
Admission: RE | Admit: 2020-07-18 | Discharge: 2020-07-18 | Disposition: A | Discharge status: Home / Self Care | Payer: Medicare Other | Source: Ambulatory Visit | Attending: Ophthalmology | Admitting: Ophthalmology

## 2020-07-18 DIAGNOSIS — H47012 Ischemic optic neuropathy, left eye: Secondary | ICD-10-CM | POA: Insufficient documentation

## 2020-07-18 LAB — CBC WITH DIFFERENTIAL/PLATELET
Abs Immature Granulocytes: 0.02 10*3/uL (ref 0.00–0.07)
Basophils Absolute: 0.1 10*3/uL (ref 0.0–0.1)
Basophils Relative: 1 %
Eosinophils Absolute: 0.1 10*3/uL (ref 0.0–0.5)
Eosinophils Relative: 3 %
HCT: 39.8 % (ref 36.0–46.0)
Hemoglobin: 13.3 g/dL (ref 12.0–15.0)
Immature Granulocytes: 0 %
Lymphocytes Relative: 42 %
Lymphs Abs: 2 10*3/uL (ref 0.7–4.0)
MCH: 28.3 pg (ref 26.0–34.0)
MCHC: 33.4 g/dL (ref 30.0–36.0)
MCV: 84.7 fL (ref 80.0–100.0)
Monocytes Absolute: 0.4 10*3/uL (ref 0.1–1.0)
Monocytes Relative: 9 %
Neutro Abs: 2.2 10*3/uL (ref 1.7–7.7)
Neutrophils Relative %: 45 %
Platelets: 170 10*3/uL (ref 150–400)
RBC: 4.7 MIL/uL (ref 3.87–5.11)
RDW: 13.7 % (ref 11.5–15.5)
WBC: 4.8 10*3/uL (ref 4.0–10.5)
nRBC: 0 % (ref 0.0–0.2)

## 2020-07-18 LAB — SEDIMENTATION RATE: Sed Rate: 28 mm/hr (ref 0–30)

## 2020-07-18 LAB — C-REACTIVE PROTEIN: CRP: 0.8 mg/dL (ref <1.0)

## 2020-11-11 IMAGING — CR DG KNEE COMPLETE 4+V*R*
4 series · 4 of 4 positions shown · non-contrast
Comparison: None.

CLINICAL DATA: Fell from horse today.

EXAM:
RIGHT KNEE - COMPLETE 4+ VIEW

[knee ap]
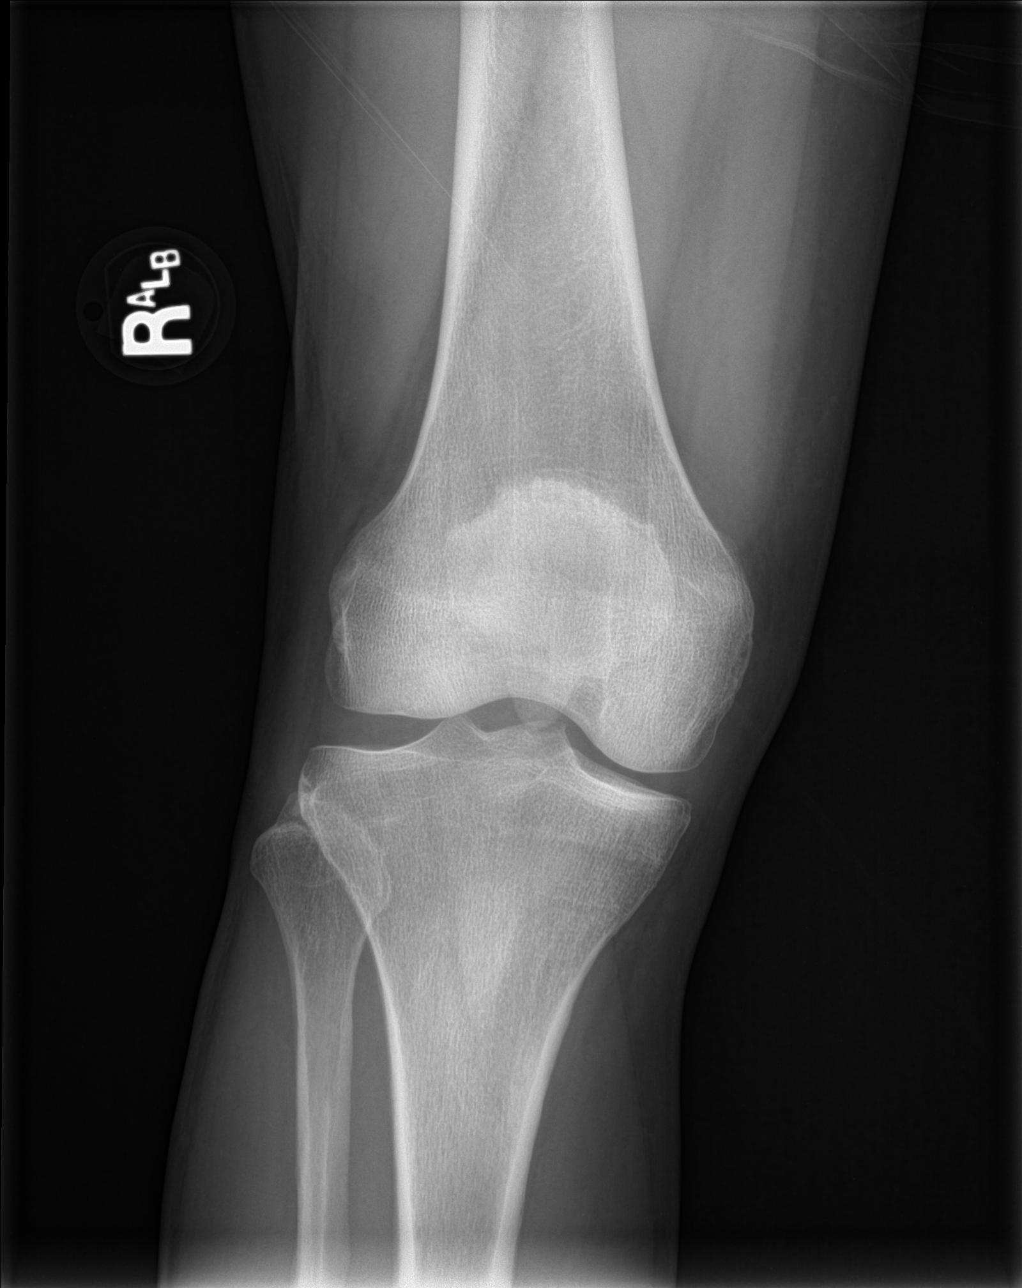

[knee lat (1 of 2)]
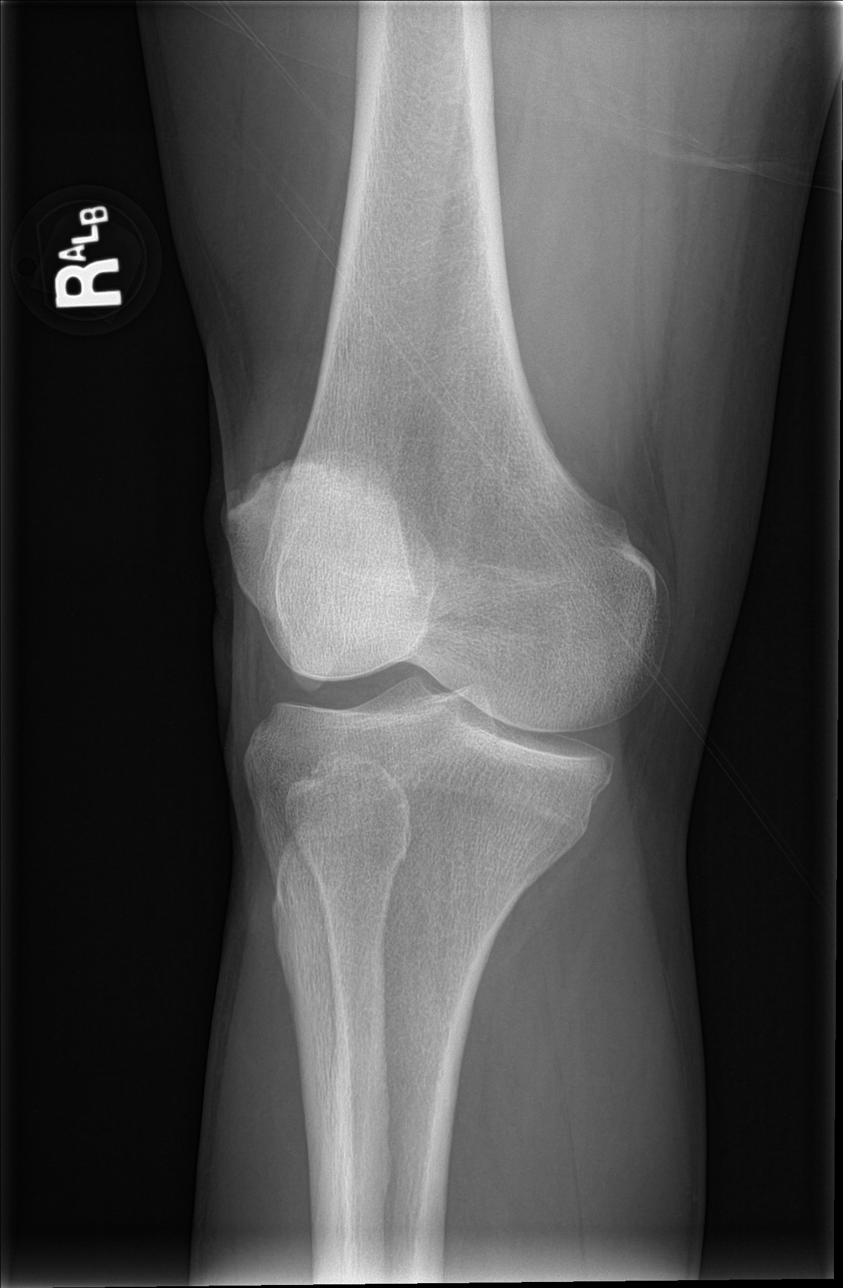

[knee obl]
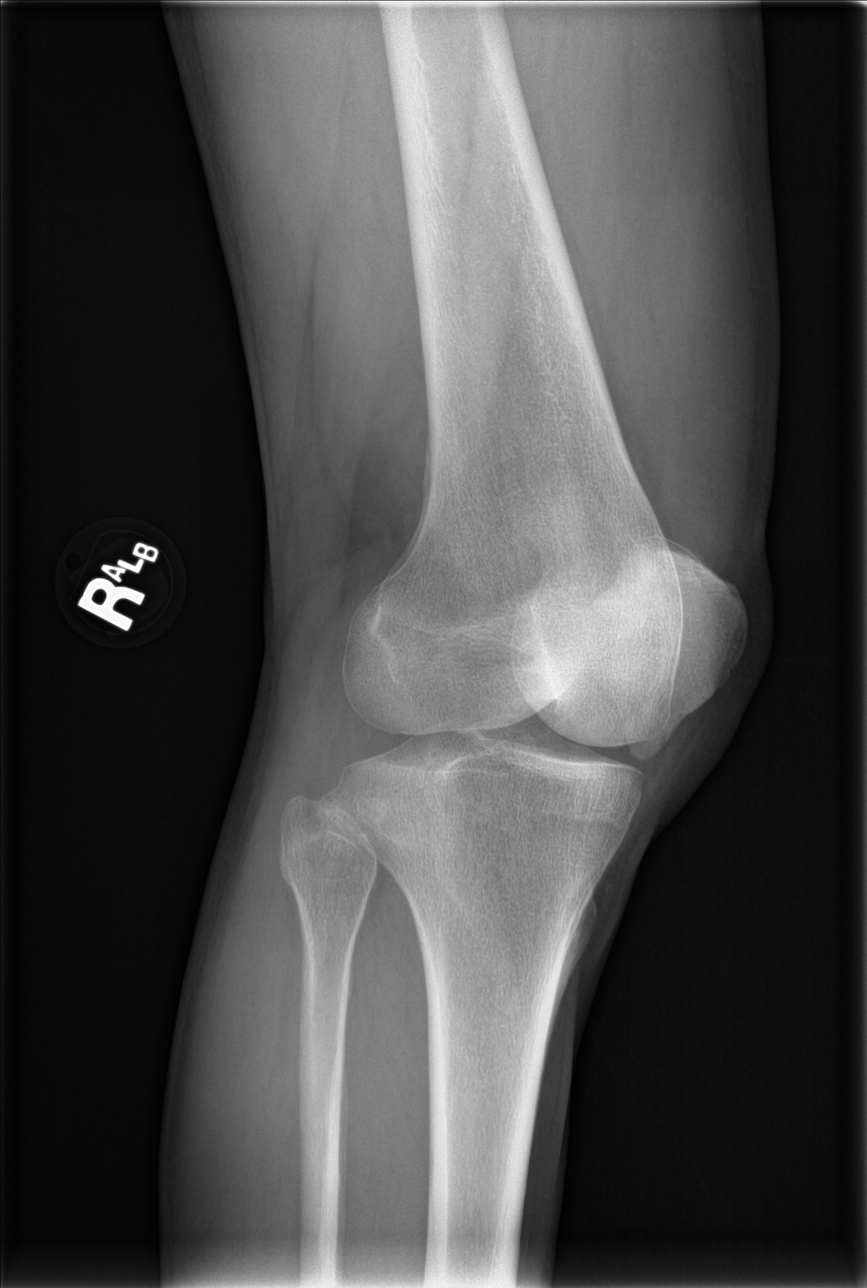

[knee lat (2 of 2)]
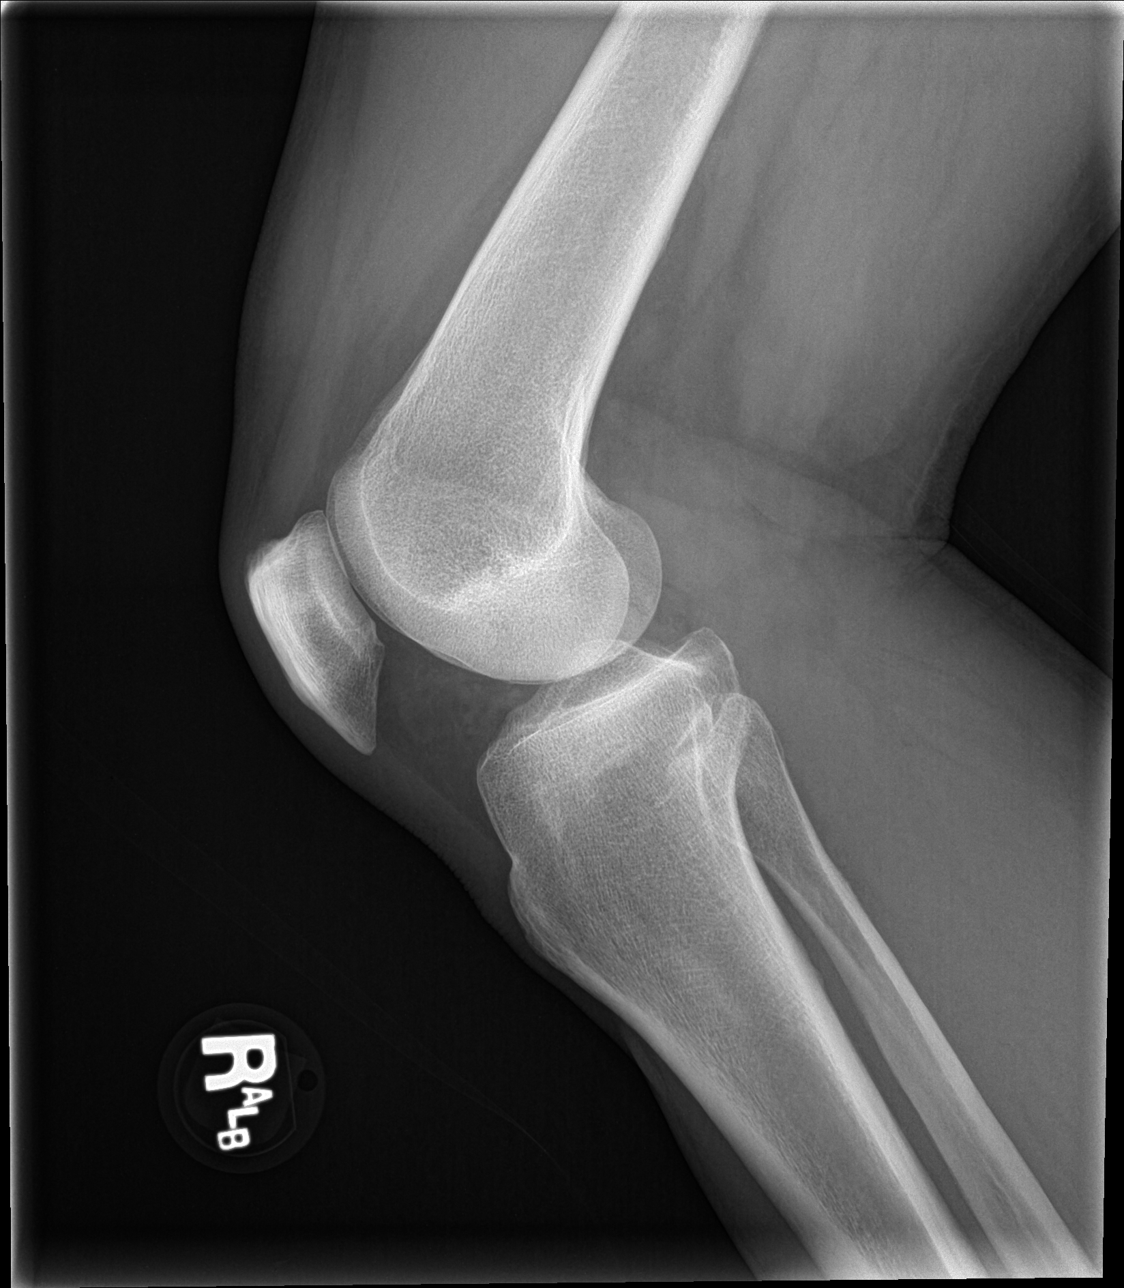

[4 of 4 positions shown; findings below may reference images not displayed]

FINDINGS: No evidence of fracture, dislocation, or joint effusion. No evidence
of arthropathy or other focal bone abnormality. Soft tissues are
unremarkable.
IMPRESSION: Negative.

## 2020-11-11 IMAGING — CR DG KNEE COMPLETE 4+V*L*
4 series · 4 of 4 positions shown · non-contrast
Comparison: None.

CLINICAL DATA: Fell from horse today.

EXAM:
LEFT KNEE - COMPLETE 4+ VIEW

[knee ap]
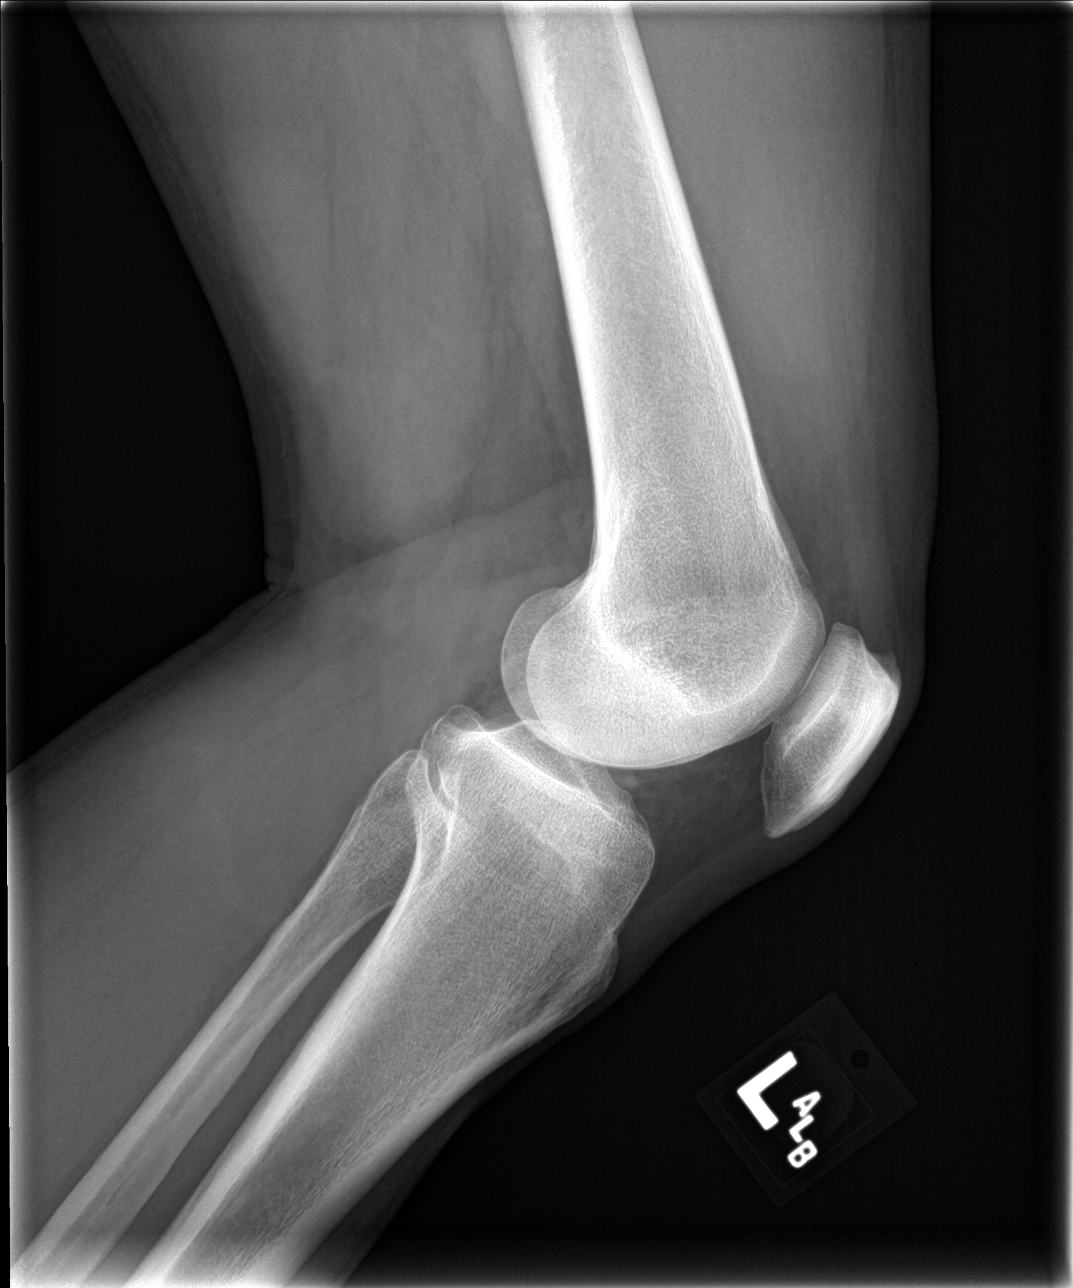

[knee obl (1 of 2)]
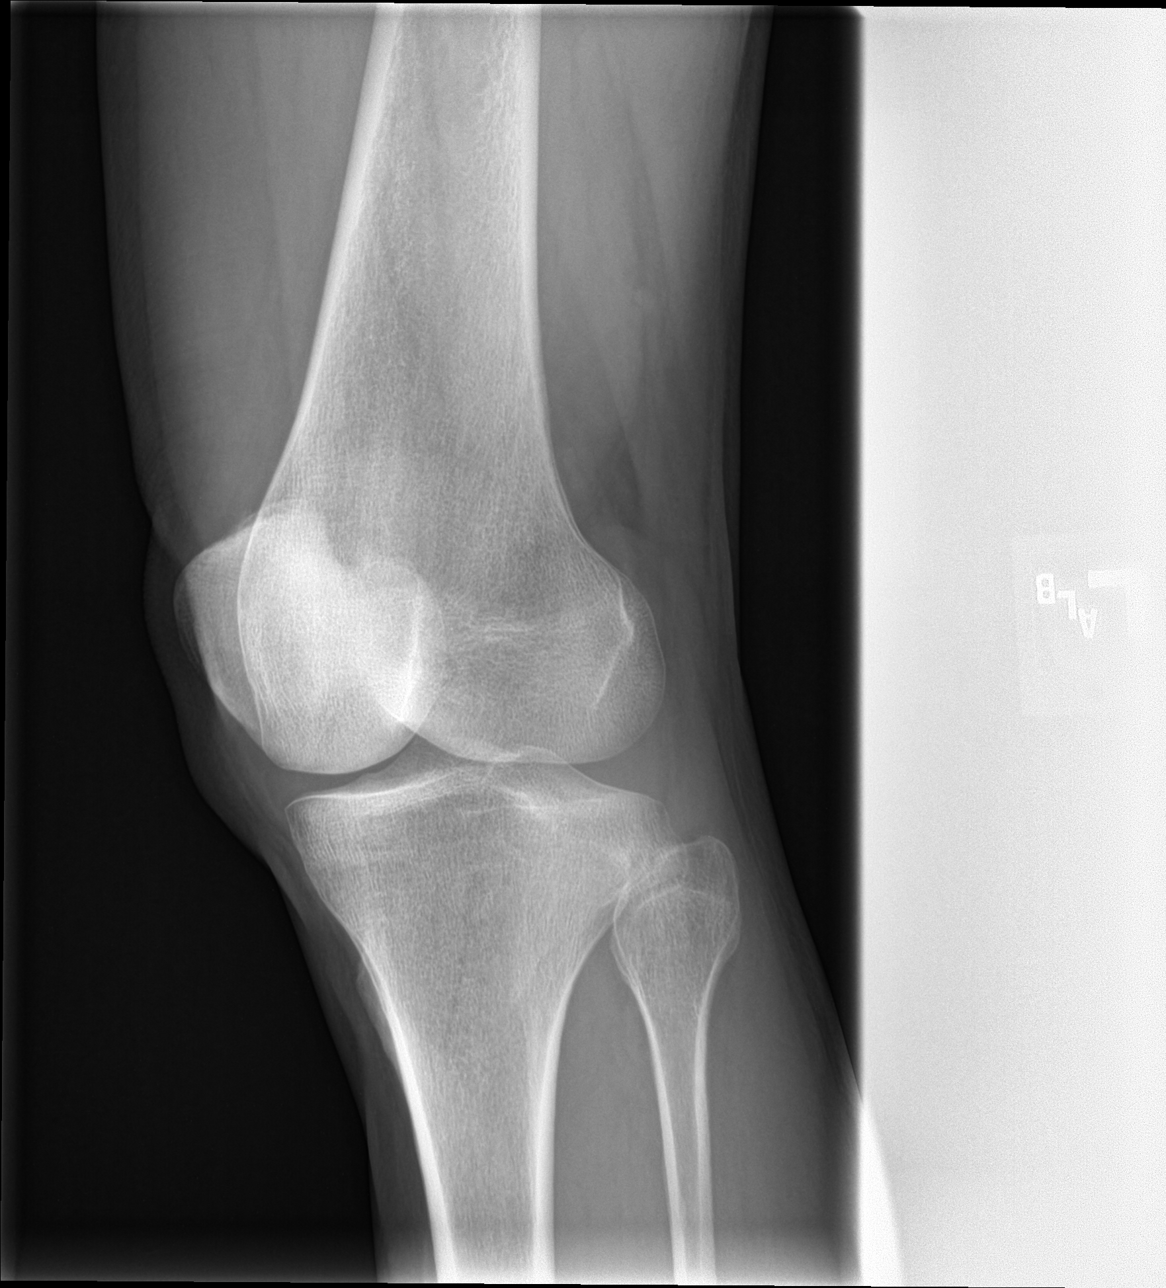

[knee obl (2 of 2)]
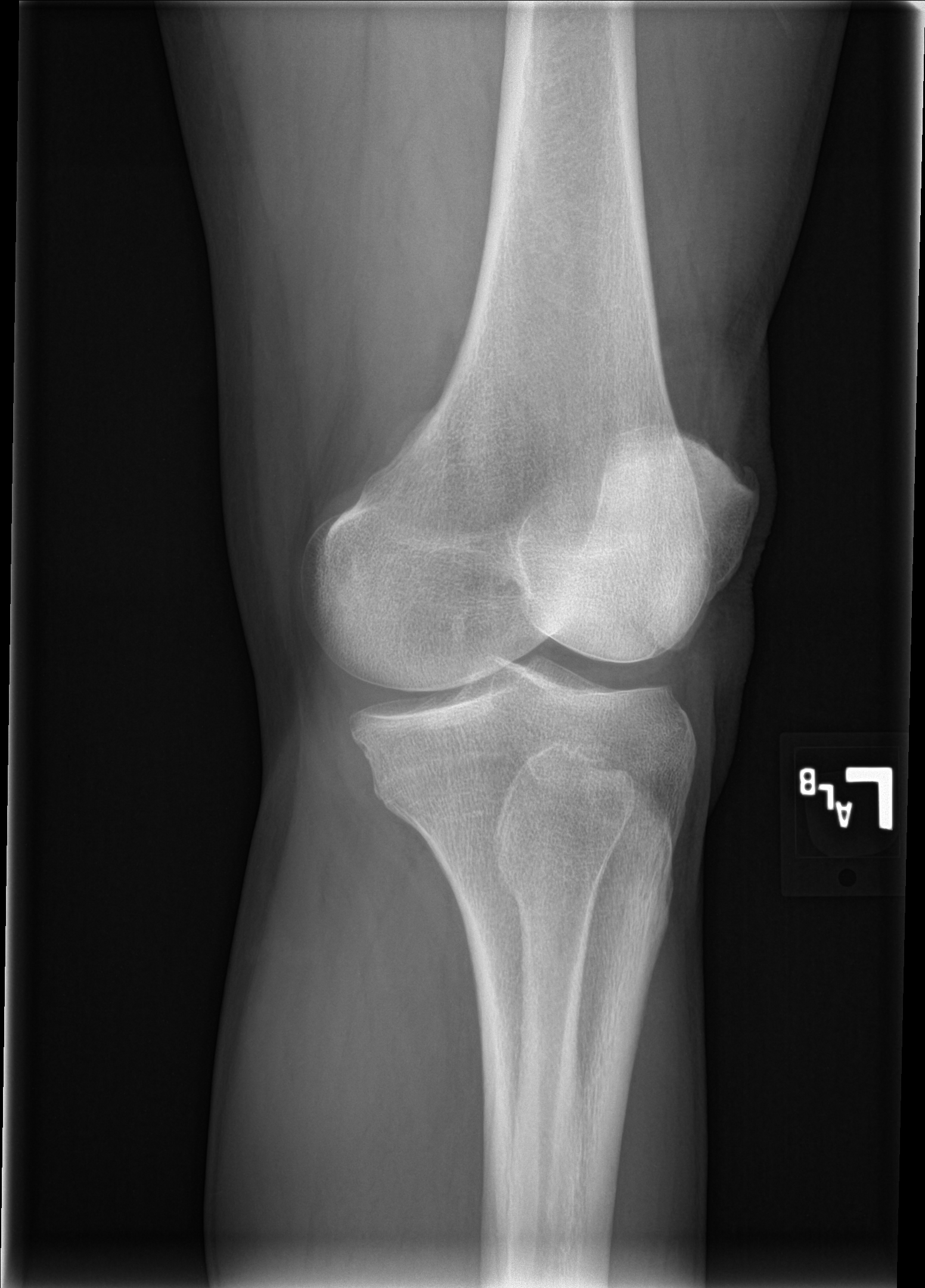

[knee lat]
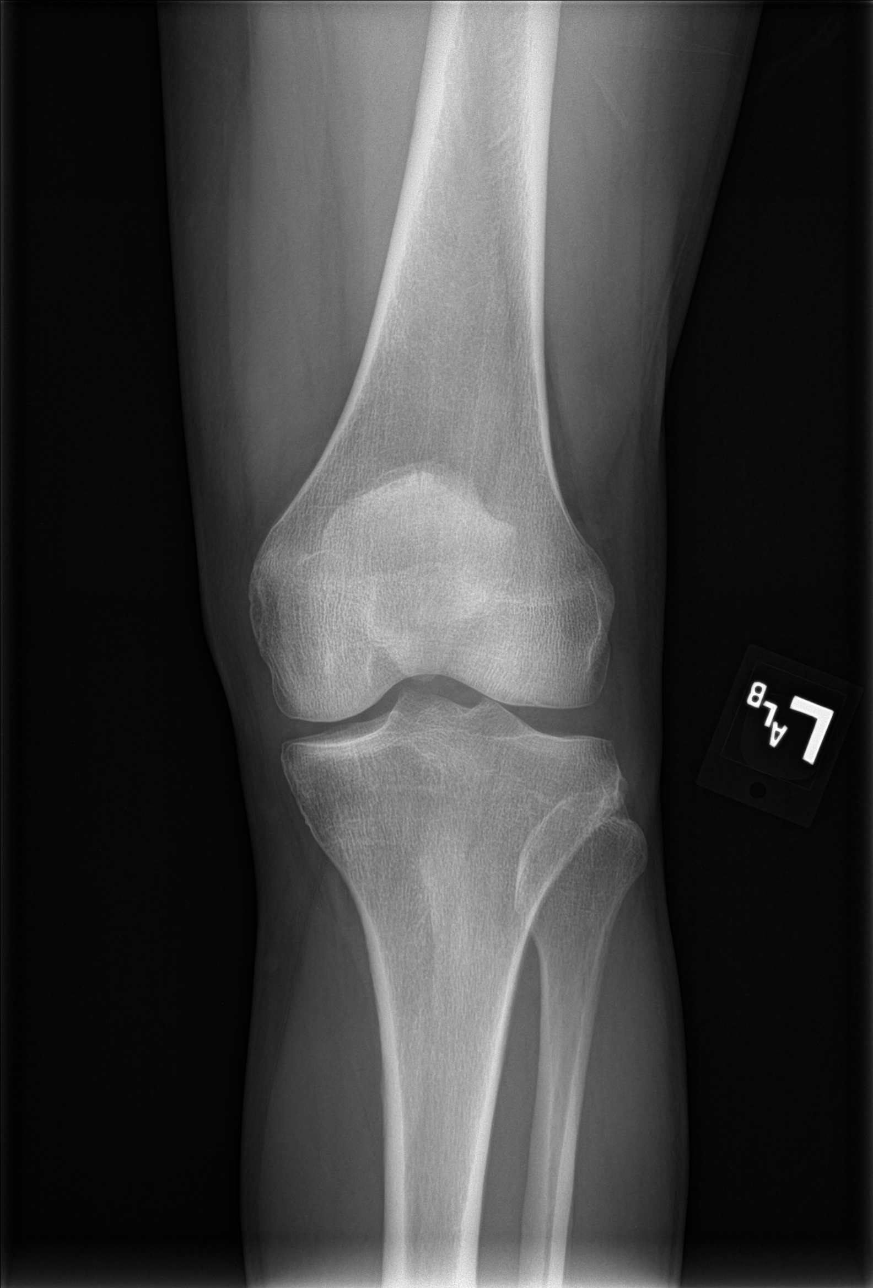

[4 of 4 positions shown; findings below may reference images not displayed]

FINDINGS: No evidence of fracture, dislocation, or joint effusion. No evidence
of arthropathy or other focal bone abnormality. Soft tissues are
unremarkable.
IMPRESSION: Negative.

## 2021-02-05 IMAGING — CR DG CLAVICLE*L*
2 series · 2 of 2 positions shown · non-contrast
Comparison: Radiographs 03/02/2020.

CLINICAL DATA: Left shoulder and clavicle pain after being thrown
from a horse today.

EXAM:
LEFT CLAVICLE - 2+ VIEWS

[clavicle ap]
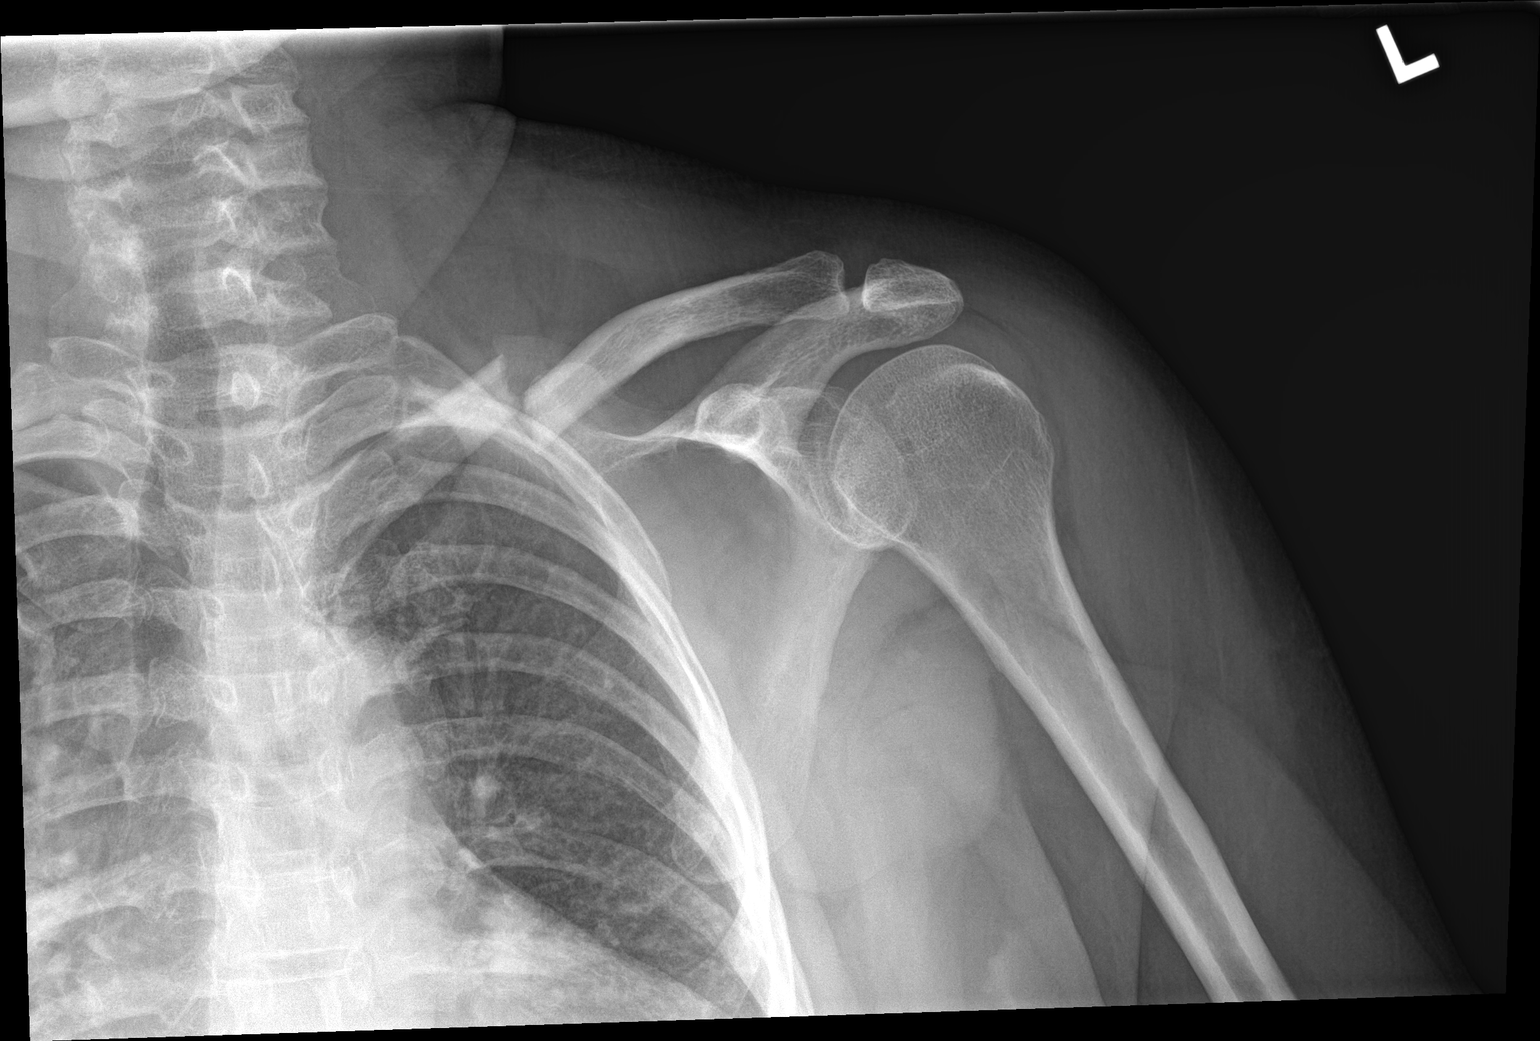

[clavicle axial]
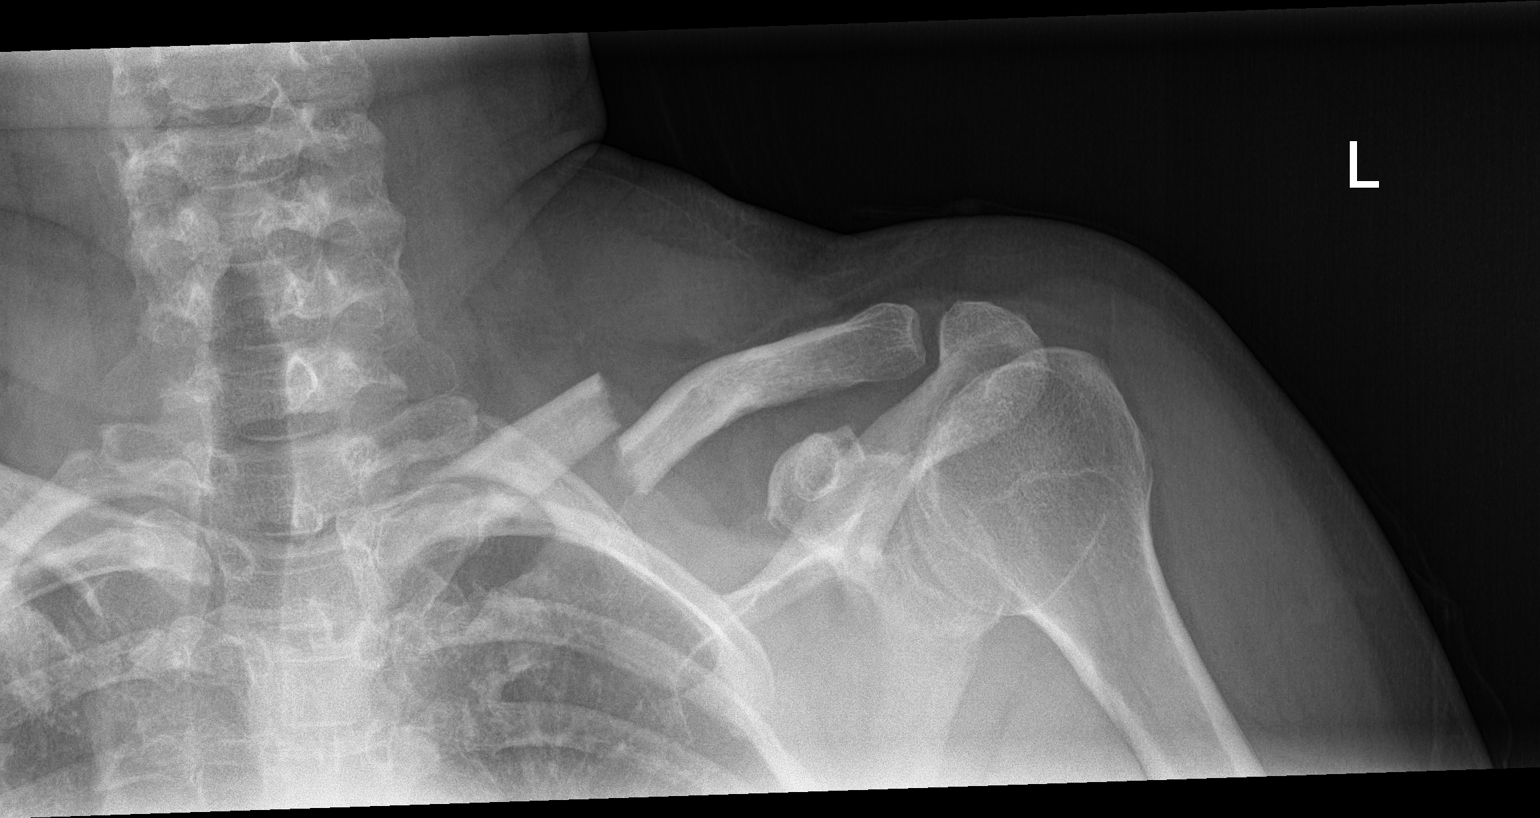

[2 of 2 positions shown; findings below may reference images not displayed]

FINDINGS: There is an acute fracture of the mid left clavicle with 1 shaft
with of inferior displacement. On the prior shoulder radiographs,
there was mild medial displacement as well. The sternoclavicular and
acromioclavicular joints are intact.
IMPRESSION: Mildly displaced mid left clavicle fracture.

## 2021-03-08 ENCOUNTER — Other Ambulatory Visit: Payer: Self-pay

## 2021-03-08 ENCOUNTER — Ambulatory Visit
Admission: EM | Admit: 2021-03-08 | Discharge: 2021-03-08 | Disposition: A | Discharge status: Home / Self Care | Payer: Medicare Other

## 2021-03-08 DIAGNOSIS — R519 Headache, unspecified: Secondary | ICD-10-CM

## 2021-03-08 DIAGNOSIS — H538 Other visual disturbances: Secondary | ICD-10-CM | POA: Diagnosis not present

## 2021-03-08 DIAGNOSIS — R42 Dizziness and giddiness: Secondary | ICD-10-CM | POA: Diagnosis not present

## 2021-03-08 NOTE — ED Triage Notes (Signed)
Pt sts she have been checking her BP and it has been elevated. 198/100 Sts she have been taking BP medication as prescribed. Also reports having a headache.

## 2021-03-08 NOTE — ED Provider Notes (Signed)
[facility]    CSN: [id number] Arrival date & time: 03/08/21  0801      History   Chief Complaint Chief Complaint  Patient presents with   Hypertension    HPI Terri Woods is a 50 y.o. female.   HPI  50 year old female here for evaluation of high blood pressure.  Patient reports that she took her blood pressure at home and it was 198/100 both brachial [place] and in her wrist.  She states that her blood pressure started to be elevated last night around 930 when she went to bed was also associated with headache.  This morning she is continues have a headache, complaining of dizziness, blurry vision in her right eye, some intermittent chest pain and shortness of breath.  She states that she took 800 mg of ibuprofen which did not relieve her headache.  She is on lisinopril for high blood pressure and she states she has not missed any doses.  She reports that when she took her blood pressure she had been sitting for at least 30 minutes and she was sitting with her feet flat on the floor, arm at heart level for 5 minutes before taking the reading.  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  She also has a history of left sided weakness which is not a new problem.  Past Medical History:  Diagnosis Date   Arthritis    neck, hips, fingers   Asthma    Back pain    Breast cancer ([place]) 2001   RT LUMPECTOMY PER PT   Carpal tunnel syndrome    right arm   Crohn's disease ([place])    Diabetes ([place])    Fibromyalgia    GERD (gastroesophageal reflux disease)    Glaucoma    Headache    Hypertension     There are no problems to display for this patient.   Past Surgical History:  Procedure Laterality Date   BILATERAL CARPAL TUNNEL RELEASE Bilateral 02/05/2019   Procedure: BILATERAL CARPAL TUNNEL RELEASE;  Surgeon: Hessie Knows, MD;  Location: [facility];  Service: Orthopedics;  Laterality: Bilateral;   BREAST EXCISIONAL BIOPSY  Right 15+ yrs ago   NEG   BREAST SURGERY     COLONOSCOPY WITH PROPOFOL N/A 03/10/2020   Procedure: COLONOSCOPY WITH PROPOFOL;  Surgeon: Benjamine Sprague, DO;  Location: [facility];  Service: General;  Laterality: N/A;   FOOT SURGERY Left    LYMPH NODE BIOPSY     NASAL SEPTOPLASTY W/ TURBINOPLASTY Bilateral 12/25/2018   Procedure: NASAL SEPTOPLASTY WITH INFERIOR TURBINATE REDUCTION;  Surgeon: Margaretha Sheffield, MD;  Location: [facility];  Service: ENT;  Laterality: Bilateral;   SHOULDER SURGERY     SMALL INTESTINE SURGERY      OB History   No obstetric history on file.      Home Medications    Prior to Admission medications   Medication Sig Start Date End Date Taking? Authorizing Provider  albuterol (VENTOLIN HFA) 108 (90 Base) MCG/ACT inhaler Inhale 2 puffs into the lungs every 6 (six) hours as needed for wheezing or shortness of breath.    [provider]  atorvastatin (LIPITOR) 20 MG tablet Take 20 mg by mouth daily at 6 PM.    [provider]  bimatoprost (LUMIGAN) 0.01 % SOLN Place 1 drop into both eyes at bedtime.    [provider]  clonazePAM (KLONOPIN) 1 MG tablet Take 1 mg  by mouth 2 (two) times daily.    [provider]  docusate sodium (COLACE) 100 MG capsule Take 100 mg by mouth 2 (two) times daily as needed for mild constipation.    [provider]  Dulaglutide 0.75 MG/0.5ML SOPN Inject into the skin. 04/27/20   [provider]  DULoxetine (CYMBALTA) 60 MG capsule Take 60 mg by mouth at bedtime.    [provider]  EPINEPHrine 0.3 mg/0.3 mL IJ SOAJ injection Inject 0.3 mg into the muscle as needed for anaphylaxis.     [provider]  ergocalciferol (VITAMIN D2) 1.25 MG (50000 UT) capsule Take 50,000 Units by mouth See admin instructions. Take 1 capsule (50000 units) by mouth once a week or once every other week    [provider]  glipiZIDE (GLUCOTROL) 5 MG tablet Take 10 mg by mouth 2  (two) times daily.    [provider]  ibuprofen (ADVIL) 800 MG tablet Take 800 mg by mouth every 8 (eight) hours as needed (pain.).    [provider]  insulin glargine (LANTUS) 100 UNIT/ML injection Inject 20 Units into the skin at bedtime.     [provider]  ketoconazole (NIZORAL) 2 % shampoo Apply 1 application topically 2 (two) times a week.    [provider]  ketorolac (TORADOL) 10 MG tablet Take 1 tablet (10 mg total) by mouth every 6 (six) hours as needed. 05/27/20   Margarette Canada, NP  lidocaine (XYLOCAINE) 5 % ointment Apply 1 application topically as needed (pain.).     [provider]  lisinopril (ZESTRIL) 2.5 MG tablet Take 2.5 mg by mouth every evening.    [provider]  miconazole (MICOTIN) 2 % cream Apply 1 application topically as needed (skin irritation/rash).    [provider]  naloxone [facility]) nasal spray 4 mg/0.1 mL Place 1 spray into the nose as needed (accidental overdose).    [provider]  omeprazole (PRILOSEC) 20 MG capsule Take 20 mg by mouth at bedtime.    [provider]  ondansetron (ZOFRAN ODT) 8 MG disintegrating tablet Take 1 tablet (8 mg total) by mouth every 8 (eight) hours as needed for nausea or vomiting. 05/27/20   Margarette Canada, NP  Oxycodone HCl 10 MG TABS Take 10 mg by mouth 3 (three) times daily.    [provider]  oxyCODONE-acetaminophen (PERCOCET/ROXICET) 5-325 MG tablet Take 1 tablet by mouth every 4 (four) hours as needed (pain.).     [provider]  QUEtiapine (SEROQUEL) 300 MG tablet Take 300 mg by mouth at bedtime.    [provider]  SUMAtriptan (IMITREX) 100 MG tablet Take 100 mg by mouth every 2 (two) hours as needed for migraine. May repeat in 2 hours if headache persists or recurs.    [provider]  tiZANidine (ZANAFLEX) 4 MG tablet Take 1 tablet (4 mg total) by mouth every 8 (eight) hours as needed for muscle spasms.  03/02/20   Coral Spikes, DO  triamcinolone (NASACORT) 55 MCG/ACT AERO nasal inhaler Place 2 sprays into the nose daily.    [provider]  triamcinolone cream (KENALOG) 0.1 % Apply 1 application topically 2 (two) times daily. 08/31/19   Norval Gable, MD  VICTOZA 18 MG/3ML SOPN SMARTSIG:0.6 Milligram(s) SUB-Q Daily 02/17/21   [provider]  topiramate (TOPAMAX) 100 MG tablet Take 100 mg by mouth 2 (two) times daily.  01/06/20  [provider]    Family History Family History  Problem Relation Age of Onset   Heart attack Sister 59   Heart disease Father    Breast cancer Mother    Breast cancer Maternal Aunt     Social History Social History   Tobacco Use   Smoking status: Never   Smokeless tobacco: Never  Vaping Use   Vaping Use: Never used  Substance Use Topics   Alcohol use: No    Alcohol/week: 0.0 standard drinks   Drug use: Yes    Types: Oxycodone     Allergies   Levodopa, Rotigotine, Erythromycin, Bee venom, Doxycycline, Penicillins, and Shellfish allergy   Review of Systems Review of Systems  Constitutional:  Negative for activity change, appetite change and fever.  Respiratory:  Positive for shortness of breath.   Cardiovascular:  Positive for chest pain.  Neurological:  Positive for dizziness, weakness and headaches.  Hematological: Negative.   Psychiatric/Behavioral: Negative.      Physical Exam Triage Vital Signs ED Triage Vitals  Enc Vitals Group     BP 03/08/21 0809 (!) 158/83     Pulse Rate 03/08/21 0809 87     Resp 03/08/21 0809 18     Temp 03/08/21 0809 97.9 F (36.6 C)     Temp Source 03/08/21 0809 Oral     SpO2 03/08/21 0809 99 %     Weight 03/08/21 0810 165 lb (74.8 kg)     Height 03/08/21 0810 5\' 1"  (1.549 m)     Head Circumference --      Peak Flow --      Pain Score 03/08/21 0810 8     Pain Loc --      Pain Edu? --      Excl. in [place]? --    No data found.  Updated Vital Signs BP (!) 146/80 Comment: pt  asked to have her BP rechecked on opposite arm.  Pulse 87   Temp 97.9 F (36.6 C) (Oral)   Resp 18   Ht 5\' 1"  (1.549 m)   Wt 165 lb (74.8 kg)   SpO2 99%   BMI 31.18 kg/m   Visual Acuity Right Eye Distance:   Left Eye Distance:   Bilateral Distance:    Right Eye Near:   Left Eye Near:    Bilateral Near:     Physical Exam Vitals and nursing note reviewed.  Constitutional:      Appearance: Normal appearance. She is obese.  HENT:     Head: Normocephalic and atraumatic.  Eyes:     Conjunctiva/sclera: Conjunctivae normal.     Comments: Pupils are dilated but reactive equally.  Patient has no vision in her left eye and is complaining of blurry vision in the right eye.  EOM is largely intact though patient does have difficulty following my penlight while assessing EOM.  Cardiovascular:     Rate and Rhythm: Normal rate and regular rhythm.     Pulses: Normal pulses.     Heart sounds: Normal heart sounds. No murmur heard.   No gallop.  Pulmonary:     Effort: Pulmonary effort is normal.     Breath sounds: Normal breath sounds. No wheezing, rhonchi or rales.  Skin:    General: Skin is warm and dry.     Capillary Refill: Capillary refill takes less than 2 seconds.     Findings: No erythema or rash.  Neurological:     General: No focal deficit present.     Mental Status: She is alert and oriented to person,  place, and time.     Cranial Nerves: Cranial nerve deficit present.  Psychiatric:        Mood and Affect: Mood normal.        Behavior: Behavior normal.        Thought Content: Thought content normal.        Judgment: Judgment normal.     UC Treatments / Results  Labs (all labs ordered are listed, but only abnormal results are displayed) Labs Reviewed - No data to display  EKG   Radiology No results found.  Procedures Procedures (including critical care time)  Medications Ordered in UC Medications - No data to display  Initial Impression / Assessment and Plan  / UC Course  I have reviewed the triage vital signs and the nursing notes.  Pertinent labs & imaging results that were available during my care of the patient were reviewed by me and considered in my medical decision making (see chart for details).  Patient is a nontoxic-appearing 50 year old female here for evaluation of elevated blood pressure that has associated symptoms of headache, dizziness, blurry vision in the right eye, intermittent chest pain and shortness of breath.  Symptoms began last night around 930 when she went to bed and at that time her symptoms were headache and dizziness.  She took 800 mg of ibuprofen without relief.  This morning she has not taken any medications.  She reports that this morning when she woke up her blood pressure was 198/100.  It is currently 146/80 in clinic.  She has a history of a previous stroke with loss of vision to her left eye and she also has left-sided weakness at baseline.  Patient's physical exam reveals pupils that are dilated but they are round and reactive.  EOM is largely intact and the patient does have trouble tracking my penlight.  No facial asymmetry and cranial nerves II through XII intact.  Patient does have decreased grip and upper extremity strength at 3/5 on the left compared to the right.  Lower extremity is also decreased on the left compared to the right with a 3/5.  Advised patient that she needs to be evaluated in the emergency department given her headache, dizziness, and blurred vision.  I advised patient that I would prefer her to go by EMS but she declined preferring to drive the mild home so her husband can take her to the hospital via [place].  Patient did drive herself to the urgent care.  Patient left ambulatory to drive herself home so that her significant other can drive her to the hospital.  Patient left AMA.   Final Clinical Impressions(s) / UC Diagnoses   Final diagnoses:  Acute nonintractable headache, unspecified headache type   Blurry vision, right eye  Dizziness     Discharge Instructions      As we discussed, your symptoms are concerning for possible stroke and I feel would be best for you to be evaluated in the emergency department.  I have offered EMS transport and you have declined preferring to drive the mild home so that you are significant other can drive you to the ER.     ED Prescriptions   None    PDMP not reviewed this encounter.   Margarette Canada, NP 03/08/21 [phone]

## 2021-03-08 NOTE — Discharge Instructions (Addendum)
As we discussed, your symptoms are concerning for possible stroke and I feel would be best for you to be evaluated in the emergency department.  I have offered EMS transport and you have declined preferring to drive the mild home so that you are significant other can drive you to the ER.

## 2021-03-13 ENCOUNTER — Ambulatory Visit
Admission: EM | Admit: 2021-03-13 | Discharge: 2021-03-13 | Disposition: A | Discharge status: Home / Self Care | Payer: Medicare Other | Attending: Emergency Medicine | Admitting: Emergency Medicine

## 2021-03-13 ENCOUNTER — Other Ambulatory Visit: Payer: Self-pay

## 2021-03-13 DIAGNOSIS — B029 Zoster without complications: Secondary | ICD-10-CM

## 2021-03-13 MED ORDER — VALACYCLOVIR HCL 1 G PO TABS: 1000.0000 mg | ORAL_TABLET | Freq: Three times a day (TID) | ORAL | 0 refills | Status: DC

## 2021-03-13 MED ORDER — GABAPENTIN 300 MG PO CAPS: 300.0000 mg | ORAL_CAPSULE | Freq: Three times a day (TID) | ORAL | 0 refills | Status: DC

## 2021-03-13 NOTE — ED Provider Notes (Signed)
[facility]    CSN: [id number] Arrival date & time: 03/13/21  0800      History   Chief Complaint Chief Complaint  Patient presents with   Rash    HPI Terri Woods is a 50 y.o. female.   HPI  50 year old female here for evaluation of rash.  Patient reports that she developed a rash in her right axilla over the weekend.  She describes it as painful in nature.  She denies any drainage from the wounds or fever.  There is no other rashes on her body.  The rash is a collection of red painful dots.  Past Medical History:  Diagnosis Date   Arthritis    neck, hips, fingers   Asthma    Back pain    Breast cancer ([place]) 2001   RT LUMPECTOMY PER PT   Carpal tunnel syndrome    right arm   Crohn's disease ([place])    Diabetes ([place])    Fibromyalgia    GERD (gastroesophageal reflux disease)    Glaucoma    Headache    Hypertension     There are no problems to display for this patient.   Past Surgical History:  Procedure Laterality Date   BILATERAL CARPAL TUNNEL RELEASE Bilateral 02/05/2019   Procedure: BILATERAL CARPAL TUNNEL RELEASE;  Surgeon: Hessie Knows, MD;  Location: [facility];  Service: Orthopedics;  Laterality: Bilateral;   BREAST EXCISIONAL BIOPSY Right 15+ yrs ago   NEG   BREAST SURGERY     COLONOSCOPY WITH PROPOFOL N/A 03/10/2020   Procedure: COLONOSCOPY WITH PROPOFOL;  Surgeon: Benjamine Sprague, DO;  Location: [facility];  Service: General;  Laterality: N/A;   FOOT SURGERY Left    LYMPH NODE BIOPSY     NASAL SEPTOPLASTY W/ TURBINOPLASTY Bilateral 12/25/2018   Procedure: NASAL SEPTOPLASTY WITH INFERIOR TURBINATE REDUCTION;  Surgeon: Margaretha Sheffield, MD;  Location: [facility];  Service: ENT;  Laterality: Bilateral;   SHOULDER SURGERY     SMALL INTESTINE SURGERY      OB History   No obstetric history on file.      Home Medications    Prior to Admission medications   Medication Sig Start Date End Date Taking? Authorizing Provider   albuterol (VENTOLIN HFA) 108 (90 Base) MCG/ACT inhaler Inhale 2 puffs into the lungs every 6 (six) hours as needed for wheezing or shortness of breath.   Yes [provider]  atorvastatin (LIPITOR) 20 MG tablet Take 20 mg by mouth daily at 6 PM.   Yes [provider]  bimatoprost (LUMIGAN) 0.01 % SOLN Place 1 drop into both eyes at bedtime.   Yes [provider]  clonazePAM (KLONOPIN) 1 MG tablet Take 1 mg by mouth 2 (two) times daily.   Yes [provider]  docusate sodium (COLACE) 100 MG capsule Take 100 mg by mouth 2 (two) times daily as needed for mild constipation.   Yes [provider]  Dulaglutide 0.75 MG/0.5ML SOPN Inject into the skin. 04/27/20  Yes [provider]  DULoxetine (CYMBALTA) 60 MG capsule Take 60 mg by mouth at bedtime.   Yes [provider]  EPINEPHrine 0.3 mg/0.3 mL IJ SOAJ injection Inject 0.3 mg into the muscle as needed for anaphylaxis.    Yes [provider]  ergocalciferol (VITAMIN D2) 1.25 MG (50000 UT) capsule Take 50,000 Units by mouth See admin instructions. Take 1 capsule (50000 units) by mouth once a week or once every other week   Yes [provider]  gabapentin (NEURONTIN) 300 MG capsule Take 1 capsule (300 mg total) by mouth 3 (three) times daily. 03/13/21  Yes Margarette Canada, NP  glipiZIDE (GLUCOTROL) 5 MG tablet Take 10 mg by mouth 2 (two) times daily.   Yes [provider]  ibuprofen (ADVIL) 800 MG tablet Take 800 mg by mouth every 8 (eight) hours as needed (pain.).   Yes [provider]  insulin glargine (LANTUS) 100 UNIT/ML injection Inject 20 Units into the skin at bedtime.    Yes [provider]  ketoconazole (NIZORAL) 2 % shampoo Apply 1 application topically 2 (two) times a week.   Yes [provider]  ketorolac (TORADOL) 10 MG tablet Take 1 tablet (10 mg total) by mouth every 6 (six) hours as needed. 05/27/20  Yes Margarette Canada, NP   lidocaine (XYLOCAINE) 5 % ointment Apply 1 application topically as needed (pain.).    Yes [provider]  lisinopril (ZESTRIL) 2.5 MG tablet Take 2.5 mg by mouth every evening.   Yes [provider]  miconazole (MICOTIN) 2 % cream Apply 1 application topically as needed (skin irritation/rash).   Yes [provider]  naloxone (NARCAN) nasal spray 4 mg/0.1 mL Place 1 spray into the nose as needed (accidental overdose).   Yes [provider]  omeprazole (PRILOSEC) 20 MG capsule Take 20 mg by mouth at bedtime.   Yes [provider]  ondansetron (ZOFRAN ODT) 8 MG disintegrating tablet Take 1 tablet (8 mg total) by mouth every 8 (eight) hours as needed for nausea or vomiting. 05/27/20  Yes Margarette Canada, NP  Oxycodone HCl 10 MG TABS Take 10 mg by mouth 3 (three) times daily.   Yes [provider]  oxyCODONE-acetaminophen (PERCOCET/ROXICET) 5-325 MG tablet Take 1 tablet by mouth every 4 (four) hours as needed (pain.).    Yes [provider]  QUEtiapine (SEROQUEL) 300 MG tablet Take 300 mg by mouth at bedtime.   Yes [provider]  SUMAtriptan (IMITREX) 100 MG tablet Take 100 mg by mouth every 2 (two) hours as needed for migraine. May repeat in 2 hours if headache persists or recurs.   Yes [provider]  tiZANidine (ZANAFLEX) 4 MG tablet Take 1 tablet (4 mg total) by mouth every 8 (eight) hours as needed for muscle spasms. 03/02/20  Yes Cook, Jayce G, DO  triamcinolone (NASACORT) 55 MCG/ACT AERO nasal inhaler Place 2 sprays into the nose daily.   Yes [provider]  triamcinolone cream (KENALOG) 0.1 % Apply 1 application topically 2 (two) times daily. 08/31/19  Yes Norval Gable, MD  valACYclovir (VALTREX) 1000 MG tablet Take 1 tablet (1,000 mg total) by mouth 3 (three) times daily. 03/13/21  Yes Margarette Canada, NP  VICTOZA 18 MG/3ML SOPN SMARTSIG:0.6 Milligram(s) SUB-Q Daily 02/17/21  Yes [provider]   topiramate (TOPAMAX) 100 MG tablet Take 100 mg by mouth 2 (two) times daily.  01/06/20  [provider]    Family History Family History  Problem Relation Age of Onset   Heart attack Sister 9   Heart disease Father    Breast cancer Mother    Breast cancer Maternal Aunt     Social History Social History   Tobacco Use   Smoking status: Never   Smokeless tobacco: Never  Vaping Use   Vaping Use: Never used  Substance Use Topics   Alcohol use: No    Alcohol/week: 0.0 standard drinks   Drug use: Yes    Types:  Oxycodone     Allergies   Levodopa, Rotigotine, Erythromycin, Bee venom, Doxycycline, Penicillins, and Shellfish allergy   Review of Systems Review of Systems  Constitutional:  Negative for fever.  Skin:  Positive for color change and rash.  Neurological:  Negative for weakness and numbness.  Hematological: Negative.   Psychiatric/Behavioral: Negative.      Physical Exam Triage Vital Signs ED Triage Vitals  Enc Vitals Group     BP 03/13/21 0812 (!) 141/79     Pulse Rate 03/13/21 0812 81     Resp 03/13/21 0812 18     Temp 03/13/21 0812 98.1 F (36.7 C)     Temp Source 03/13/21 0812 Oral     SpO2 03/13/21 0812 98 %     Weight 03/13/21 0812 165 lb (74.8 kg)     Height 03/13/21 0812 5\' 1"  (1.549 m)     Head Circumference --      Peak Flow --      Pain Score 03/13/21 0811 8     Pain Loc --      Pain Edu? --      Excl. in [place]? --    No data found.  Updated Vital Signs BP (!) 141/79 (BP Location: Left Arm)   Pulse 81   Temp 98.1 F (36.7 C) (Oral)   Resp 18   Ht 5\' 1"  (1.549 m)   Wt 165 lb (74.8 kg)   SpO2 98%   BMI 31.18 kg/m   Visual Acuity Right Eye Distance:   Left Eye Distance:   Bilateral Distance:    Right Eye Near:   Left Eye Near:    Bilateral Near:     Physical Exam Vitals and nursing note reviewed.  Constitutional:      General: She is not in acute distress.    Appearance: Normal appearance. She is not ill-appearing.   HENT:     Head: Normocephalic and atraumatic.  Skin:    General: Skin is dry.     Capillary Refill: Capillary refill takes less than 2 seconds.     Findings: Erythema and rash present.  Neurological:     General: No focal deficit present.     Mental Status: She is alert and oriented to person, place, and time.  Psychiatric:        Mood and Affect: Mood normal.        Behavior: Behavior normal.        Thought Content: Thought content normal.        Judgment: Judgment normal.     UC Treatments / Results  Labs (all labs ordered are listed, but only abnormal results are displayed) Labs Reviewed - No data to display  EKG   Radiology No results found.  Procedures Procedures (including critical care time)  Medications Ordered in UC Medications - No data to display  Initial Impression / Assessment and Plan / UC Course  I have reviewed the triage vital signs and the nursing notes.  Pertinent labs & imaging results that were available during my care of the patient were reviewed by me and considered in my medical decision making (see chart for details).  Patient is a nontoxic-appearing 50 year old female here for evaluation of a rash in her right axilla that has been there for the past 2 to 3 days.  She states that it started over the weekend and but she is unsure of what day.  When asked to describe the rash she describes it as painful.  I asked her if she had pain prior to the rash eruption and she is unsure.  She states she is unsure if it is a burning pain as it is just so painful.  The rash consists of 3 linear erythematous maculopapular lesions with an erythematous base in a linear formation with a single satellite lesion 1 dermatome level above and 1 dermatome level below.  There are no vesicles noted and no pustules.  Suspect patient's rash is a result of shingles and will treat her with Valtrex 1000 mg 3 times daily x7 days.  She has been having issues with elevated blood  pressure so I will not provide prednisone at this time but will use gabapentin to help calm her nerve pain.  I advised the patient that this will make her sleepy so that she should take her first dose at bedtime and after a day or so she can take a second dose in the morning if need be.   Final Clinical Impressions(s) / UC Diagnoses   Final diagnoses:  Herpes zoster without complication     Discharge Instructions      Take the Valtrex 3 times a day for 7 days.  Start the Gabapentin at bedtime for the nerve pain. You can take a second dose in the morning if you need to for pain.  Follow-up with your primary care physician.    ED Prescriptions     Medication Sig Dispense Auth. Provider   valACYclovir (VALTREX) 1000 MG tablet Take 1 tablet (1,000 mg total) by mouth 3 (three) times daily. 21 tablet Margarette Canada, NP   gabapentin (NEURONTIN) 300 MG capsule Take 1 capsule (300 mg total) by mouth 3 (three) times daily. 30 capsule Margarette Canada, NP      PDMP not reviewed this encounter.   Margarette Canada, NP 03/13/21 [phone]

## 2021-03-13 NOTE — ED Triage Notes (Signed)
Pt here with C/O rash, started Friday. Pt states it feels like it could be shingles, it is under her right arm, about 4 spots and a dark line. Pt does have a UTI and yeast infection that she is on antibiotics.

## 2021-03-13 NOTE — Discharge Instructions (Signed)
Take the Valtrex 3 times a day for 7 days.  Start the Gabapentin at bedtime for the nerve pain. You can take a second dose in the morning if you need to for pain.  Follow-up with your primary care physician.

## 2021-05-26 ENCOUNTER — Ambulatory Visit (INDEPENDENT_AMBULATORY_CARE_PROVIDER_SITE_OTHER): Payer: Medicare Other

## 2021-05-26 ENCOUNTER — Other Ambulatory Visit: Payer: Self-pay

## 2021-05-26 ENCOUNTER — Ambulatory Visit
Admission: EM | Admit: 2021-05-26 | Discharge: 2021-05-26 | Disposition: A | Discharge status: Home / Self Care | Payer: Medicare Other | Attending: Emergency Medicine | Admitting: Emergency Medicine

## 2021-05-26 DIAGNOSIS — Z20822 Contact with and (suspected) exposure to covid-19: Secondary | ICD-10-CM | POA: Diagnosis present

## 2021-05-26 DIAGNOSIS — R042 Hemoptysis: Secondary | ICD-10-CM | POA: Insufficient documentation

## 2021-05-26 DIAGNOSIS — R52 Pain, unspecified: Secondary | ICD-10-CM | POA: Insufficient documentation

## 2021-05-26 DIAGNOSIS — U071 COVID-19: Secondary | ICD-10-CM

## 2021-05-26 LAB — RESP PANEL BY RT-PCR (FLU A&B, COVID) ARPGX2
Influenza A by PCR: NEGATIVE
Influenza B by PCR: NEGATIVE
SARS Coronavirus 2 by RT PCR: POSITIVE — AB

## 2021-05-26 MED ORDER — MOLNUPIRAVIR EUA 200MG CAPSULE: 4.0000 | ORAL_CAPSULE | Freq: Two times a day (BID) | ORAL | 0 refills | Status: AC

## 2021-05-26 MED ORDER — ALBUTEROL SULFATE HFA 108 (90 BASE) MCG/ACT IN AERS: 2.0000 | INHALATION_SPRAY | Freq: Four times a day (QID) | RESPIRATORY_TRACT | 0 refills | Status: DC | PRN

## 2021-05-26 MED ORDER — BENZONATATE 100 MG PO CAPS: 100.0000 mg | ORAL_CAPSULE | Freq: Three times a day (TID) | ORAL | 0 refills | Status: DC

## 2021-05-26 NOTE — ED Triage Notes (Signed)
Pt here with C/O chest Congestion, body aches, chills. Husband tested positive for Covid yesterday.

## 2021-05-26 NOTE — ED Provider Notes (Signed)
[facility]    CSN: [id number] Arrival date & time: 05/26/21  0801      History   Chief Complaint Chief Complaint  Patient presents with   Generalized Body Aches    HPI Terri Woods is a 51 y.o. female.   HPI  Body Aches: Patient reports that they have had symptoms of chest congestion, body aches, chills, cough, "dry heaving", mild dizziness, mild constipation with mild almost resolved abd pain from bowel movement this morning for the past "few" days. Symptoms are stable. They deny chest pain. Otherwise pan positive for fever ("99"), hemoptysis that is described as "a little bit of pink" when asked. They have tried tylenol cold and sinus for symptoms. Husband has COVID-19. Asks for antivirals if positive for COVID-19. (Has BMP on file).    Past Medical History:  Diagnosis Date   Arthritis    neck, hips, fingers   Asthma    Back pain    Breast cancer ([place]) 2001   RT LUMPECTOMY PER PT   Carpal tunnel syndrome    right arm   Crohn's disease ([place])    Diabetes ([place])    Fibromyalgia    GERD (gastroesophageal reflux disease)    Glaucoma    Headache    Hypertension     There are no problems to display for this patient.   Past Surgical History:  Procedure Laterality Date   BILATERAL CARPAL TUNNEL RELEASE Bilateral 02/05/2019   Procedure: BILATERAL CARPAL TUNNEL RELEASE;  Surgeon: Hessie Knows, MD;  Location: [facility];  Service: Orthopedics;  Laterality: Bilateral;   BREAST EXCISIONAL BIOPSY Right 15+ yrs ago   NEG   BREAST SURGERY     COLONOSCOPY WITH PROPOFOL N/A 03/10/2020   Procedure: COLONOSCOPY WITH PROPOFOL;  Surgeon: Benjamine Sprague, DO;  Location: [facility];  Service: General;  Laterality: N/A;   FOOT SURGERY Left    LYMPH NODE BIOPSY     NASAL SEPTOPLASTY W/ TURBINOPLASTY Bilateral 12/25/2018   Procedure: NASAL SEPTOPLASTY WITH INFERIOR TURBINATE REDUCTION;  Surgeon: Margaretha Sheffield, MD;  Location: [place];  Service: ENT;  Laterality:  Bilateral;   SHOULDER SURGERY     SMALL INTESTINE SURGERY      OB History   No obstetric history on file.      Home Medications    Prior to Admission medications   Medication Sig Start Date End Date Taking? Authorizing Provider  albuterol (VENTOLIN HFA) 108 (90 Base) MCG/ACT inhaler Inhale 2 puffs into the lungs every 6 (six) hours as needed for wheezing or shortness of breath.    [provider]  atorvastatin (LIPITOR) 20 MG tablet Take 20 mg by mouth daily at 6 PM.    [provider]  bimatoprost (LUMIGAN) 0.01 % SOLN Place 1 drop into both eyes at bedtime.    [provider]  clonazePAM (KLONOPIN) 1 MG tablet Take 1 mg by mouth 2 (two) times daily.    [provider]  docusate sodium (COLACE) 100 MG capsule Take 100 mg by mouth 2 (two) times daily as needed for mild constipation.    [provider]  Dulaglutide 0.75 MG/0.5ML SOPN Inject into the skin. 04/27/20   [provider]  DULoxetine (CYMBALTA) 60 MG capsule Take 60 mg by mouth at bedtime.    [provider]  EPINEPHrine 0.3 mg/0.3 mL IJ SOAJ injection Inject 0.3 mg into the muscle as needed for anaphylaxis.     [provider]  ergocalciferol (VITAMIN D2) 1.25  MG (50000 UT) capsule Take 50,000 Units by mouth See admin instructions. Take 1 capsule (50000 units) by mouth once a week or once every other week    [provider]  gabapentin (NEURONTIN) 300 MG capsule Take 1 capsule (300 mg total) by mouth 3 (three) times daily. 03/13/21   Margarette Canada, NP  glipiZIDE (GLUCOTROL) 5 MG tablet Take 10 mg by mouth 2 (two) times daily.    [provider]  ibuprofen (ADVIL) 800 MG tablet Take 800 mg by mouth every 8 (eight) hours as needed (pain.).    [provider]  insulin glargine (LANTUS) 100 UNIT/ML injection Inject 20 Units into the skin at bedtime.     [provider]  ketoconazole (NIZORAL) 2 % shampoo Apply 1 application  topically 2 (two) times a week.    [provider]  ketorolac (TORADOL) 10 MG tablet Take 1 tablet (10 mg total) by mouth every 6 (six) hours as needed. 05/27/20   Margarette Canada, NP  lidocaine (XYLOCAINE) 5 % ointment Apply 1 application topically as needed (pain.).     [provider]  lisinopril (ZESTRIL) 2.5 MG tablet Take 2.5 mg by mouth every evening.    [provider]  miconazole (MICOTIN) 2 % cream Apply 1 application topically as needed (skin irritation/rash).    [provider]  naloxone [facility]) nasal spray 4 mg/0.1 mL Place 1 spray into the nose as needed (accidental overdose).    [provider]  omeprazole (PRILOSEC) 20 MG capsule Take 20 mg by mouth at bedtime.    [provider]  ondansetron (ZOFRAN ODT) 8 MG disintegrating tablet Take 1 tablet (8 mg total) by mouth every 8 (eight) hours as needed for nausea or vomiting. 05/27/20   Margarette Canada, NP  Oxycodone HCl 10 MG TABS Take 10 mg by mouth 3 (three) times daily.    [provider]  oxyCODONE-acetaminophen (PERCOCET/ROXICET) 5-325 MG tablet Take 1 tablet by mouth every 4 (four) hours as needed (pain.).     [provider]  QUEtiapine (SEROQUEL) 300 MG tablet Take 300 mg by mouth at bedtime.    [provider]  SUMAtriptan (IMITREX) 100 MG tablet Take 100 mg by mouth every 2 (two) hours as needed for migraine. May repeat in 2 hours if headache persists or recurs.    [provider]  tiZANidine (ZANAFLEX) 4 MG tablet Take 1 tablet (4 mg total) by mouth every 8 (eight) hours as needed for muscle spasms. 03/02/20   Coral Spikes, DO  triamcinolone (NASACORT) 55 MCG/ACT AERO nasal inhaler Place 2 sprays into the nose daily.    [provider]  triamcinolone cream (KENALOG) 0.1 % Apply 1 application topically 2 (two) times daily. 08/31/19   Norval Gable, MD  valACYclovir (VALTREX) 1000 MG tablet Take 1 tablet (1,000 mg total) by mouth 3  (three) times daily. 03/13/21   Margarette Canada, NP  VICTOZA 18 MG/3ML SOPN SMARTSIG:0.6 Milligram(s) SUB-Q Daily 02/17/21   [provider]  topiramate (TOPAMAX) 100 MG tablet Take 100 mg by mouth 2 (two) times daily.  01/06/20  [provider]    Family History Family History  Problem Relation Age of Onset   Heart attack Sister 68   Heart disease Father    Breast cancer Mother    Breast cancer Maternal Aunt     Social History Social History   Tobacco Use   Smoking status: Never   Smokeless tobacco: Never  Vaping Use  Vaping Use: Never used  Substance Use Topics   Alcohol use: No    Alcohol/week: 0.0 standard drinks   Drug use: Yes    Types: Oxycodone     Allergies   Levodopa, Rotigotine, Erythromycin, Bee venom, Doxycycline, Penicillins, and Shellfish allergy   Review of Systems Review of Systems  As stated above in HPI Physical Exam Triage Vital Signs ED Triage Vitals  Enc Vitals Group     BP 05/26/21 0810 109/75     Pulse Rate 05/26/21 0810 93     Resp 05/26/21 0810 18     Temp 05/26/21 0810 98.1 F (36.7 C)     Temp Source 05/26/21 0810 Oral     SpO2 05/26/21 0810 98 %     Weight 05/26/21 0809 160 lb (72.6 kg)     Height 05/26/21 0809 5\' 1"  (1.549 m)     Head Circumference --      Peak Flow --      Pain Score 05/26/21 0809 9     Pain Loc --      Pain Edu? --      Excl. in [place]? --    No data found.  Updated Vital Signs BP 109/75 (BP Location: Left Arm)    Pulse 93    Temp 98.1 F (36.7 C) (Oral)    Resp 18    Ht 5\' 1"  (1.549 m)    Wt 160 lb (72.6 kg)    SpO2 98%    BMI 30.23 kg/m   Physical Exam Vitals and nursing note reviewed.  Constitutional:      General: She is not in acute distress.    Appearance: Normal appearance. She is obese. She is not ill-appearing, toxic-appearing or diaphoretic.  HENT:     Head: Normocephalic and atraumatic.     Right Ear: Tympanic membrane normal.     Left Ear: Tympanic membrane normal.      Nose: Congestion (mild) present.     Mouth/Throat:     Mouth: Mucous membranes are moist.     Pharynx: Oropharynx is clear. No oropharyngeal exudate or posterior oropharyngeal erythema.  Eyes:     General: No scleral icterus.       Right eye: No discharge.        Left eye: No discharge.     Extraocular Movements: Extraocular movements intact.     Conjunctiva/sclera: Conjunctivae normal.     Pupils: Pupils are equal, round, and reactive to light.  Cardiovascular:     Rate and Rhythm: Normal rate and regular rhythm.     Heart sounds: Normal heart sounds.  Pulmonary:     Effort: Pulmonary effort is normal.     Breath sounds: Normal breath sounds.  Abdominal:     General: Bowel sounds are normal. There is no distension.     Palpations: Abdomen is soft. There is no mass.     Tenderness: There is no abdominal tenderness. There is no guarding or rebound.     Hernia: No hernia is present.  Musculoskeletal:     Cervical back: Normal range of motion and neck supple.  Lymphadenopathy:     Cervical: No cervical adenopathy.  Skin:    General: Skin is warm.     Coloration: Skin is not pale.     Findings: No bruising or rash.  Neurological:     Mental Status: She is alert and oriented to person, place, and time.     UC Treatments / Results  Labs (  all labs ordered are listed, but only abnormal results are displayed) Labs Reviewed  RESP PANEL BY RT-PCR (FLU A&B, COVID) [id number]    EKG   Radiology No results found.  Procedures Procedures (including critical care time)  Medications Ordered in UC Medications - No data to display  Initial Impression / Assessment and Plan / UC Course  I have reviewed the triage vital signs and the nursing notes.  Pertinent labs & imaging results that were available during my care of the patient were reviewed by me and considered in my medical decision making (see chart for details).     New.  Discussed risks, benefits and red flag signs and  common potential side effect symptoms regarding COVID-19 in the moment.  We are which she wishes to have filled for her.  Also sending in Tessalon and albuterol.  Discussed rest and hydration along with red flag symptoms.  Follow-up as needed. Final Clinical Impressions(s) / UC Diagnoses   Final diagnoses:  None   Discharge Instructions   None    ED Prescriptions   None    PDMP not reviewed this encounter.   Hughie Closs, [place] 05/26/21 [phone]

## 2021-06-01 ENCOUNTER — Ambulatory Visit
Admission: EM | Admit: 2021-06-01 | Discharge: 2021-06-01 | Disposition: A | Discharge status: Home / Self Care | Payer: Medicare Other | Attending: Emergency Medicine | Admitting: Emergency Medicine

## 2021-06-01 ENCOUNTER — Other Ambulatory Visit: Payer: Self-pay

## 2021-06-01 ENCOUNTER — Ambulatory Visit (INDEPENDENT_AMBULATORY_CARE_PROVIDER_SITE_OTHER): Payer: Medicare Other

## 2021-06-01 ENCOUNTER — Encounter: Payer: Self-pay | Admitting: Emergency Medicine

## 2021-06-01 DIAGNOSIS — R0602 Shortness of breath: Secondary | ICD-10-CM | POA: Diagnosis not present

## 2021-06-01 DIAGNOSIS — R059 Cough, unspecified: Secondary | ICD-10-CM | POA: Diagnosis not present

## 2021-06-01 DIAGNOSIS — J069 Acute upper respiratory infection, unspecified: Secondary | ICD-10-CM | POA: Diagnosis not present

## 2021-06-01 MED ORDER — PROMETHAZINE-DM 6.25-15 MG/5ML PO SYRP: 5.0000 mL | ORAL_SOLUTION | Freq: Four times a day (QID) | ORAL | 0 refills | Status: DC | PRN

## 2021-06-01 MED ORDER — PREDNISONE 20 MG PO TABS: 60.0000 mg | ORAL_TABLET | Freq: Every day | ORAL | 0 refills | Status: AC

## 2021-06-01 MED ORDER — IPRATROPIUM BROMIDE 0.06 % NA SOLN: 2.0000 | Freq: Four times a day (QID) | NASAL | 12 refills | Status: DC

## 2021-06-01 NOTE — Discharge Instructions (Addendum)
Your chest x-ray did not show any evidence of pneumonia or secondary bacterial infection.  I also did not see any evidence of bacterial infection when I was assessing your upper airways.  Continue your albuterol inhaler as needed for shortness breath or wheezing.  Take the Atrovent nasal spray and instill 2 squirts up each nostril every 6 hours as needed for nasal congestion and postnasal drip.  Start the prednisone tomorrow morning and take 60 mg daily for 5 days to help with pulmonary inflammation and this should assist your breathing.  Use the Promethazine DM cough syrup at bedtime as needed for cough and congestion.

## 2021-06-01 NOTE — ED Triage Notes (Signed)
Patient is here with husband. Read bad cough, sinus pressure, history of asthma, now sob. Using Inhaler, last time this morning. No fever. Symptoms started "last Monday". Here diagnosed with COVID19 recently.

## 2021-06-01 NOTE — ED Provider Notes (Signed)
[facility]    CSN: [id number] Arrival date & time: 06/01/21  1707      History   Chief Complaint Chief Complaint  Patient presents with   Trouble Breathing    HPI Terri Woods is a 51 y.o. female.   HPI  51 year old female here for evaluation of respiratory complaints.  Patient was diagnosed with COVID 6 days ago and reports that in the interim she has been experiencing increasing shortness of breath, wheezing, cough that is productive for green mucus.  She states that she is blowing a clear to light yellow mucus out of her nose as well.  She is experiencing significant sinus pressure but denies a fever.  She was prescribed oral antivirals which she has taken.  She states that when she tries to use her inhaler her postnasal drip chokes her and she is unable to use her inhaler.  Last time she used her inhaler was 630 this morning.  Past Medical History:  Diagnosis Date   Arthritis    neck, hips, fingers   Asthma    Back pain    Breast cancer ([place]) 2001   RT LUMPECTOMY PER PT   Carpal tunnel syndrome    right arm   Crohn's disease ([place])    Diabetes ([place])    Fibromyalgia    GERD (gastroesophageal reflux disease)    Glaucoma    Headache    Hypertension     There are no problems to display for this patient.   Past Surgical History:  Procedure Laterality Date   BILATERAL CARPAL TUNNEL RELEASE Bilateral 02/05/2019   Procedure: BILATERAL CARPAL TUNNEL RELEASE;  Surgeon: Hessie Knows, MD;  Location: [facility];  Service: Orthopedics;  Laterality: Bilateral;   BREAST EXCISIONAL BIOPSY Right 15+ yrs ago   NEG   BREAST SURGERY     COLONOSCOPY WITH PROPOFOL N/A 03/10/2020   Procedure: COLONOSCOPY WITH PROPOFOL;  Surgeon: Benjamine Sprague, DO;  Location: [facility];  Service: General;  Laterality: N/A;   FOOT SURGERY Left    LYMPH NODE BIOPSY     NASAL SEPTOPLASTY W/ TURBINOPLASTY Bilateral 12/25/2018   Procedure: NASAL SEPTOPLASTY WITH INFERIOR TURBINATE  REDUCTION;  Surgeon: Margaretha Sheffield, MD;  Location: [place];  Service: ENT;  Laterality: Bilateral;   SHOULDER SURGERY     SMALL INTESTINE SURGERY      OB History   No obstetric history on file.      Home Medications    Prior to Admission medications   Medication Sig Start Date End Date Taking? Authorizing Provider  albuterol (VENTOLIN HFA) 108 (90 Base) MCG/ACT inhaler Inhale 2 puffs into the lungs every 6 (six) hours as needed for wheezing or shortness of breath. Patient taking differently: Inhale 2 puffs into the lungs every 6 (six) hours as needed for wheezing or shortness of breath. Last used: 6 am. 05/26/21  Yes C[place]ton, Sarah M, PA-C  atorvastatin (LIPITOR) 20 MG tablet Take 20 mg by mouth daily at 6 PM.   Yes [provider]  benzonatate (TESSALON) 100 MG capsule Take 1 capsule (100 mg total) by mouth every 8 (eight) hours. 05/26/21  Yes Covington, Sarah M, PA-C  bimatoprost (LUMIGAN) 0.01 % SOLN Place 1 drop into both eyes at bedtime.   Yes [provider]  clonazePAM (KLONOPIN) 1 MG tablet Take 1 mg by mouth 2 (two) times daily.   Yes [provider]  DULoxetine (CYMBALTA) 60 MG capsule Take 60 mg by mouth at bedtime.  Yes [provider]  ergocalciferol (VITAMIN D2) 1.25 MG (50000 UT) capsule Take 50,000 Units by mouth See admin instructions. Take 1 capsule (50000 units) by mouth once a week or once every other week   Yes [provider]  gabapentin (NEURONTIN) 300 MG capsule Take 1 capsule (300 mg total) by mouth 3 (three) times daily. 03/13/21  Yes Margarette Canada, NP  glipiZIDE (GLUCOTROL) 5 MG tablet Take 10 mg by mouth 2 (two) times daily.   Yes [provider]  insulin glargine (LANTUS) 100 UNIT/ML injection Inject 20 Units into the skin at bedtime.    Yes [provider]  ipratropium (ATROVENT) 0.06 % nasal spray Place 2 sprays into both nostrils 4 (four) times daily. 06/01/21  Yes Margarette Canada, NP   ketorolac (TORADOL) 10 MG tablet Take 1 tablet (10 mg total) by mouth every 6 (six) hours as needed. 05/27/20  Yes Margarette Canada, NP  lisinopril (ZESTRIL) 2.5 MG tablet Take 2.5 mg by mouth every evening.   Yes [provider]  omeprazole (PRILOSEC) 20 MG capsule Take 20 mg by mouth at bedtime.   Yes [provider]  predniSONE (DELTASONE) 20 MG tablet Take 3 tablets (60 mg total) by mouth daily with breakfast for 5 days. 3 tablets daily for 5 days. 06/01/21 06/06/21 Yes Margarette Canada, NP  promethazine-dextromethorphan (PROMETHAZINE-DM) 6.25-15 MG/5ML syrup Take 5 mLs by mouth 4 (four) times daily as needed. 06/01/21  Yes Margarette Canada, NP  QUEtiapine (SEROQUEL) 300 MG tablet Take 300 mg by mouth at bedtime.   Yes [provider]  VICTOZA 18 MG/3ML SOPN SMARTSIG:0.6 Milligram(s) SUB-Q Daily 02/17/21  Yes [provider]  docusate sodium (COLACE) 100 MG capsule Take 100 mg by mouth 2 (two) times daily as needed for mild constipation.    [provider]  Dulaglutide 0.75 MG/0.5ML SOPN Inject into the skin. 04/27/20   [provider]  EPINEPHrine 0.3 mg/0.3 mL IJ SOAJ injection Inject 0.3 mg into the muscle as needed for anaphylaxis.     [provider]  ibuprofen (ADVIL) 800 MG tablet Take 800 mg by mouth every 8 (eight) hours as needed (pain.).    [provider]  ketoconazole (NIZORAL) 2 % shampoo Apply 1 application topically 2 (two) times a week.    [provider]  lidocaine (XYLOCAINE) 5 % ointment Apply 1 application topically as needed (pain.).     [provider]  miconazole (MICOTIN) 2 % cream Apply 1 application topically as needed (skin irritation/rash).    [provider]  naloxone [facility]) nasal spray 4 mg/0.1 mL Place 1 spray into the nose as needed (accidental overdose).    [provider]  ondansetron (ZOFRAN ODT) 8 MG disintegrating tablet Take 1 tablet (8 mg total) by mouth every 8  (eight) hours as needed for nausea or vomiting. 05/27/20   Margarette Canada, NP  Oxycodone HCl 10 MG TABS Take 10 mg by mouth 3 (three) times daily.    [provider]  oxyCODONE-acetaminophen (PERCOCET/ROXICET) 5-325 MG tablet Take 1 tablet by mouth every 4 (four) hours as needed (pain.).     [provider]  SUMAtriptan (IMITREX) 100 MG tablet Take 100 mg by mouth every 2 (two) hours as needed for migraine. May repeat in 2 hours if headache persists or recurs.    [provider]  tiZANidine (ZANAFLEX) 4 MG tablet Take 1 tablet (4 mg total) by mouth every 8 (eight) hours as needed for muscle spasms. 03/02/20  Cook, Jayce G, DO  triamcinolone (NASACORT) 55 MCG/ACT AERO nasal inhaler Place 2 sprays into the nose daily.    [provider]  triamcinolone cream (KENALOG) 0.1 % Apply 1 application topically 2 (two) times daily. 08/31/19   Norval Gable, MD  valACYclovir (VALTREX) 1000 MG tablet Take 1 tablet (1,000 mg total) by mouth 3 (three) times daily. 03/13/21   Margarette Canada, NP  topiramate (TOPAMAX) 100 MG tablet Take 100 mg by mouth 2 (two) times daily.  01/06/20  [provider]    Family History Family History  Problem Relation Age of Onset   Heart attack Sister 45   Heart disease Father    Breast cancer Mother    Breast cancer Maternal Aunt     Social History Social History   Tobacco Use   Smoking status: Never   Smokeless tobacco: Never  Vaping Use   Vaping Use: Never used  Substance Use Topics   Alcohol use: No    Alcohol/week: 0.0 standard drinks   Drug use: Yes    Types: Oxycodone     Allergies   Levodopa, Rotigotine, Erythromycin, Bee venom, Doxycycline, Penicillins, and Shellfish allergy   Review of Systems Review of Systems  Constitutional:  Negative for fever.  HENT:  Positive for congestion, rhinorrhea and sinus pressure.   Respiratory:  Positive for cough, shortness of breath and wheezing.   Hematological: Negative.    Psychiatric/Behavioral: Negative.      Physical Exam Triage Vital Signs ED Triage Vitals [06/01/21 1713]  Enc Vitals Group     BP (!) 177/84     Pulse Rate 79     Resp (!) 24     Temp 98.4 F (36.9 C)     Temp Source Oral     SpO2 98 %     Weight 160 lb 0.9 oz (72.6 kg)     Height      Head Circumference      Peak Flow      Pain Score 0     Pain Loc      Pain Edu?      Excl. in [place]?    No data found.  Updated Vital Signs BP (!) 154/78 (BP Location: Left Arm)    Pulse 79    Temp 98.4 F (36.9 C) (Oral)    Resp (!) 24    Wt 160 lb 0.9 oz (72.6 kg)    SpO2 98%    BMI 30.24 kg/m   Visual Acuity Right Eye Distance:   Left Eye Distance:   Bilateral Distance:    Right Eye Near:   Left Eye Near:    Bilateral Near:     Physical Exam Vitals and nursing note reviewed.  Constitutional:      General: She is not in acute distress.    Appearance: Normal appearance. She is not ill-appearing.  HENT:     Head: Normocephalic and atraumatic.     Right Ear: Tympanic membrane, ear canal and external ear normal. There is no impacted cerumen.     Left Ear: Tympanic membrane, ear canal and external ear normal. There is no impacted cerumen.     Nose: Congestion and rhinorrhea present.     Mouth/Throat:     Mouth: Mucous membranes are moist.     Pharynx: Oropharynx is clear. Posterior oropharyngeal erythema present.  Cardiovascular:     Rate and Rhythm: Normal rate and regular rhythm.     Pulses: Normal pulses.  Heart sounds: Normal heart sounds. No murmur heard.   No friction rub. No gallop.  Pulmonary:     Effort: Pulmonary effort is normal.     Breath sounds: Rhonchi present. No wheezing or rales.  Musculoskeletal:     Cervical back: Normal range of motion and neck supple.  Lymphadenopathy:     Cervical: No cervical adenopathy.  Skin:    General: Skin is warm and dry.     Capillary Refill: Capillary refill takes less than 2 seconds.     Findings: No erythema or rash.   Neurological:     General: No focal deficit present.     Mental Status: She is alert and oriented to person, place, and time.  Psychiatric:        Mood and Affect: Mood normal.        Behavior: Behavior normal.        Thought Content: Thought content normal.        Judgment: Judgment normal.     UC Treatments / Results  Labs (all labs ordered are listed, but only abnormal results are displayed) Labs Reviewed - No data to display  EKG   Radiology DG Chest 2 View  Result Date: 06/01/2021 CLINICAL DATA:  COVID diagnosed 6 days ago, increased shortness of breath and cough EXAM: CHEST - 2 VIEW COMPARISON:  05/26/2021 FINDINGS: Frontal and lateral views of the chest demonstrate an unremarkable cardiac silhouette. No airspace disease, effusion, or pneumothorax. Stable postsurgical changes left clavicle ORIF. Prior healed left rib fractures. No acute bony abnormality. IMPRESSION: 1. No acute intrathoracic process. Electronically Signed   By: Randa Ngo M.D.   On: 06/01/2021 17:34    Procedures Procedures (including critical care time)  Medications Ordered in UC Medications - No data to display  Initial Impression / Assessment and Plan / UC Course  I have reviewed the triage vital signs and the nursing notes.  Pertinent labs & imaging results that were available during my care of the patient were reviewed by me and considered in my medical decision making (see chart for details).  Patient is a nontoxic-appearing 51 year old female here for evaluation of increasing shortness of breath and productive cough being diagnosed with COVID 6 days ago.  He was placed on molnupiravir and reports that she has been using her inhalers.  Her last use was at 630 this morning.  She states that when she uses her inhalers her postnasal drip causes her to become choked.  On physical exam patient is not in any acute respiratory distress and she is able to speak in full sentences.  She has pearly-gray  tympanic membranes bilaterally with normal reflex and clear external auditory canals.  Nasal mucosa is erythematous and edematous with clear discharge in both nares.  Oropharyngeal exam reveals mild posterior oropharyngeal erythema with clear postnasal drip.  No cervical lymphadenopathy appreciated exam.  Cardiopulmonary exam reveals faint rhonchi and bilateral middle lobes.  No wheezes or rales appreciated on exam.  Will obtain repeat chest radiograph to evaluate for any acute cardiothoracic process.  Chest x-ray independently reviewed and evaluated by me.  Impression: Lung spaces are well pneumatized and there is no definitive infiltrate or effusion noted.  Radiology overread is pending. Radiology impression is no acute intrathoracic process.  There is no evidence of pneumonia or secondary bacterial infection.  I will give patient prednisone to help with her breathing, Atrovent nasal spray to help with nasal congestion and postnasal drip, and have her continue her inhalers  at home subsist with her breathing.  Muscular Promethazine DM cough syrup use at bedtime.   Final Clinical Impressions(s) / UC Diagnoses   Final diagnoses:  Acute upper respiratory infection   Discharge Instructions   None    ED Prescriptions     Medication Sig Dispense Auth. Provider   ipratropium (ATROVENT) 0.06 % nasal spray Place 2 sprays into both nostrils 4 (four) times daily. 15 mL Margarette Canada, NP   predniSONE (DELTASONE) 20 MG tablet Take 3 tablets (60 mg total) by mouth daily with breakfast for 5 days. 3 tablets daily for 5 days. 15 tablet Margarette Canada, NP   promethazine-dextromethorphan (PROMETHAZINE-DM) 6.25-15 MG/5ML syrup Take 5 mLs by mouth 4 (four) times daily as needed. 118 mL Margarette Canada, NP      PDMP not reviewed this encounter.   Margarette Canada, NP 06/01/21 1745

## 2021-10-24 ENCOUNTER — Encounter: Payer: Self-pay | Admitting: Ophthalmology

## 2021-10-25 NOTE — Discharge Instructions (Signed)

## 2021-10-30 ENCOUNTER — Ambulatory Visit: Payer: Medicare Other | Admitting: Anesthesiology

## 2021-10-30 ENCOUNTER — Other Ambulatory Visit: Payer: Self-pay

## 2021-10-30 ENCOUNTER — Encounter: Payer: Self-pay | Admitting: Ophthalmology

## 2021-10-30 ENCOUNTER — Ambulatory Visit
Admission: RE | Admit: 2021-10-30 | Discharge: 2021-10-30 | Disposition: A | Discharge status: Home / Self Care | Payer: Medicare Other | Attending: Ophthalmology | Admitting: Ophthalmology

## 2021-10-30 ENCOUNTER — Encounter
Admission: RE | Disposition: A | Discharge status: Home / Self Care | Payer: Self-pay | Source: Home / Self Care | Attending: Ophthalmology

## 2021-10-30 DIAGNOSIS — Z7984 Long term (current) use of oral hypoglycemic drugs: Secondary | ICD-10-CM | POA: Diagnosis not present

## 2021-10-30 DIAGNOSIS — I10 Essential (primary) hypertension: Secondary | ICD-10-CM | POA: Insufficient documentation

## 2021-10-30 DIAGNOSIS — Z7985 Long-term (current) use of injectable non-insulin antidiabetic drugs: Secondary | ICD-10-CM | POA: Insufficient documentation

## 2021-10-30 DIAGNOSIS — R519 Headache, unspecified: Secondary | ICD-10-CM | POA: Insufficient documentation

## 2021-10-30 DIAGNOSIS — Z794 Long term (current) use of insulin: Secondary | ICD-10-CM | POA: Insufficient documentation

## 2021-10-30 DIAGNOSIS — E1136 Type 2 diabetes mellitus with diabetic cataract: Secondary | ICD-10-CM | POA: Insufficient documentation

## 2021-10-30 DIAGNOSIS — K219 Gastro-esophageal reflux disease without esophagitis: Secondary | ICD-10-CM | POA: Diagnosis not present

## 2021-10-30 DIAGNOSIS — Z853 Personal history of malignant neoplasm of breast: Secondary | ICD-10-CM | POA: Diagnosis not present

## 2021-10-30 DIAGNOSIS — H2511 Age-related nuclear cataract, right eye: Secondary | ICD-10-CM | POA: Insufficient documentation

## 2021-10-30 DIAGNOSIS — M1990 Unspecified osteoarthritis, unspecified site: Secondary | ICD-10-CM | POA: Insufficient documentation

## 2021-10-30 DIAGNOSIS — M797 Fibromyalgia: Secondary | ICD-10-CM | POA: Insufficient documentation

## 2021-10-30 DIAGNOSIS — Z01818 Encounter for other preprocedural examination: Secondary | ICD-10-CM

## 2021-10-30 DIAGNOSIS — J45909 Unspecified asthma, uncomplicated: Secondary | ICD-10-CM | POA: Diagnosis not present

## 2021-10-30 HISTORY — PX: CATARACT EXTRACTION W/PHACO: SHX586

## 2021-10-30 LAB — GLUCOSE, CAPILLARY
Glucose-Capillary: 179 mg/dL — ABNORMAL HIGH (ref 70–99)
Glucose-Capillary: 161 mg/dL — ABNORMAL HIGH (ref 70–99)

## 2021-10-30 SURGERY — PHACOEMULSIFICATION, CATARACT, WITH IOL INSERTION
Anesthesia: Monitor Anesthesia Care | Site: Eye | Laterality: Right

## 2021-10-30 MED ORDER — LIDOCAINE HCL (PF) 2 % IJ SOLN
INTRAOCULAR | Status: DC | PRN
  Administered 2021-10-30: 1 mL via INTRAOCULAR

## 2021-10-30 MED ORDER — ONDANSETRON HCL 4 MG/2ML IJ SOLN: 4.0000 mg | Freq: Once | INTRAMUSCULAR | Status: DC | PRN

## 2021-10-30 MED ORDER — SIGHTPATH DOSE#1 BSS IO SOLN
INTRAOCULAR | Status: DC | PRN
  Administered 2021-10-30: 15 mL

## 2021-10-30 MED ORDER — SIGHTPATH DOSE#1 SODIUM HYALURONATE 23 MG/ML IO SOLUTION
PREFILLED_SYRINGE | INTRAOCULAR | Status: DC | PRN
  Administered 2021-10-30: 0.6 mL via INTRAOCULAR

## 2021-10-30 MED ORDER — LACTATED RINGERS IV SOLN: INTRAVENOUS | Status: DC | PRN

## 2021-10-30 MED ORDER — ACETAMINOPHEN 325 MG PO TABS
650.0000 mg | ORAL_TABLET | ORAL | Status: DC | PRN
Start: 2021-10-30 — End: 2021-10-30

## 2021-10-30 MED ORDER — SIGHTPATH DOSE#1 SODIUM HYALURONATE 10 MG/ML IO SOLUTION
PREFILLED_SYRINGE | INTRAOCULAR | Status: DC | PRN
  Administered 2021-10-30: 0.85 mL via INTRAOCULAR

## 2021-10-30 MED ORDER — DIPHENHYDRAMINE HCL 50 MG/ML IJ SOLN
INTRAMUSCULAR | Status: DC | PRN
  Administered 2021-10-30 (×2): 12.5 mg via INTRAVENOUS

## 2021-10-30 MED ORDER — MOXIFLOXACIN HCL 0.5 % OP SOLN
OPHTHALMIC | Status: DC | PRN
  Administered 2021-10-30: 0.2 mL via OPHTHALMIC

## 2021-10-30 MED ORDER — LACTATED RINGERS IV SOLN: INTRAVENOUS | Status: DC

## 2021-10-30 MED ORDER — MIDAZOLAM HCL 2 MG/2ML IJ SOLN
INTRAMUSCULAR | Status: DC | PRN
  Administered 2021-10-30: 1 mg via INTRAVENOUS
  Administered 2021-10-30: 2 mg via INTRAVENOUS
  Administered 2021-10-30: 1 mg via INTRAVENOUS

## 2021-10-30 MED ORDER — TETRACAINE HCL 0.5 % OP SOLN
1.0000 [drp] | OPHTHALMIC | Status: DC | PRN
  Administered 2021-10-30 (×3): 1 [drp] via OPHTHALMIC

## 2021-10-30 MED ORDER — FENTANYL CITRATE (PF) 100 MCG/2ML IJ SOLN
INTRAMUSCULAR | Status: DC | PRN
Start: 2021-10-30 — End: 2021-10-30
  Administered 2021-10-30 (×2): 50 ug via INTRAVENOUS

## 2021-10-30 MED ORDER — ACETAMINOPHEN 160 MG/5ML PO SOLN: 325.0000 mg | ORAL | Status: DC | PRN

## 2021-10-30 MED ORDER — ARMC OPHTHALMIC DILATING DROPS: OPHTHALMIC | Status: DC | PRN

## 2021-10-30 MED ORDER — SIGHTPATH DOSE#1 BSS IO SOLN
INTRAOCULAR | Status: DC | PRN
  Administered 2021-10-30: 59 mL via OPHTHALMIC

## 2021-10-30 SURGICAL SUPPLY — 14 items
CATARACT SUITE SIGHTPATH (MISCELLANEOUS) ×2 IMPLANT
DISSECTOR HYDRO NUCLEUS 50X22 (MISCELLANEOUS) ×2 IMPLANT
FEE CATARACT SUITE SIGHTPATH (MISCELLANEOUS) ×1 IMPLANT
GLOVE SURG GAMMEX PI TX LF 7.5 (GLOVE) ×2 IMPLANT
GLOVE SURG SYN 8.5  E (GLOVE) ×1
GLOVE SURG SYN 8.5 E (GLOVE) ×1 IMPLANT
GLOVE SURG SYN 8.5 PF PI (GLOVE) ×1 IMPLANT
LENS IOL TECNIS EYHANCE 22.0 (Intraocular Lens) ×1 IMPLANT
NDL FILTER BLUNT 18X1 1/2 (NEEDLE) ×1 IMPLANT
NEEDLE FILTER BLUNT 18X 1/2SAF (NEEDLE) ×1
NEEDLE FILTER BLUNT 18X1 1/2 (NEEDLE) ×1 IMPLANT
SYR 3ML LL SCALE MARK (SYRINGE) ×2 IMPLANT
SYR 5ML LL (SYRINGE) ×2 IMPLANT
WATER STERILE IRR 250ML POUR (IV SOLUTION) ×2 IMPLANT

## 2021-10-30 NOTE — Transfer of Care (Signed)
Immediate Anesthesia Transfer of Care Note  Patient: Terri Woods  Procedure(s) Performed: CATARACT EXTRACTION PHACO AND INTRAOCULAR LENS PLACEMENT (IOC) RIGHT DIABETIC 1.85 00:20.9 (Right: Eye)  Patient Location: PACU  Anesthesia Type: MAC  Level of Consciousness: awake, alert  and patient cooperative  Airway and Oxygen Therapy: Patient Spontanous Breathing and Patient connected to supplemental oxygen  Post-op Assessment: Post-op Vital signs reviewed, Patient's Cardiovascular Status Stable, Respiratory Function Stable, Patent Airway and No signs of Nausea or vomiting  Post-op Vital Signs: Reviewed and stable  Complications: No notable events documented.

## 2021-10-30 NOTE — Op Note (Signed)
OPERATIVE NOTE  Terri Woods [id number] 10/30/2021   PREOPERATIVE DIAGNOSIS:  Nuclear sclerotic cataract right eye.  H25.11   POSTOPERATIVE DIAGNOSIS:    Nuclear sclerotic cataract right eye.     PROCEDURE:  Phacoemusification with posterior chamber intraocular lens placement of the right eye   LENS:   Implant Name Type Inv. Item Serial No. Manufacturer Lot No. LRB No. Used Action  LENS IOL TECNIS EYHANCE 22.0 - [id number] Intraocular Lens LENS IOL TECNIS EYHANCE 22.0 [id number] [employer]  Right 1 Implanted       Procedure(s) with comments: CATARACT EXTRACTION PHACO AND INTRAOCULAR LENS PLACEMENT (IOC) RIGHT DIABETIC 1.85 00:20.9 (Right) - Diabetic  DIB00 +22.0   ULTRASOUND TIME: 0 minutes 20 seconds.  CDE 1.85   SURGEON:  Benay Pillow, MD, MPH  ANESTHESIOLOGIST: Anesthesiologist: Heniser, Fredric Dine, MD CRNA: Dionne Bucy, CRNA   ANESTHESIA:  Topical with tetracaine drops augmented with 1% preservative-free intracameral lidocaine.  ESTIMATED BLOOD LOSS: less than 1 mL.   COMPLICATIONS:  None.   DESCRIPTION OF PROCEDURE:  The patient was identified in the holding room and transported to the operating room and placed in the supine position under the operating microscope.  The right eye was identified as the operative eye and it was prepped and draped in the usual sterile ophthalmic fashion.   A 1.0 millimeter clear-corneal paracentesis was made at the 10:30 position. 0.5 ml of preservative-free 1% lidocaine with epinephrine was injected into the anterior chamber.  The anterior chamber was filled with Healon 5 viscoelastic.  A 2.4 millimeter keratome was used to make a near-clear corneal incision at the 8:00 position.  A curvilinear capsulorrhexis was made with a cystotome and capsulorrhexis forceps.  Balanced salt solution was used to hydrodissect and hydrodelineate the nucleus.   Phacoemulsification was then used in stop and chop fashion to remove the lens nucleus and  epinucleus.  The remaining cortex was then removed using the irrigation and aspiration handpiece. Healon was then placed into the capsular bag to distend it for lens placement.  A lens was then injected into the capsular bag.  The remaining viscoelastic was aspirated.   Wounds were hydrated with balanced salt solution.  The anterior chamber was inflated to a physiologic pressure with balanced salt solution.   Intracameral vigamox 0.1 mL undiluted was injected into the eye and a drop placed onto the ocular surface.  No wound leaks were noted.  The patient was taken to the recovery room in stable condition without complications of anesthesia or surgery  Benay Pillow 10/30/2021, 10:04 AM

## 2021-10-30 NOTE — Anesthesia Preprocedure Evaluation (Signed)
Anesthesia Evaluation  Patient identified by MRN, date of birth, ID band Patient awake    Reviewed: Allergy & Precautions, NPO status , Patient's Chart, lab work & pertinent test results, reviewed documented beta blocker date and time   History of Anesthesia Complications Negative for: history of anesthetic complications  Airway Mallampati: III  TM Distance: <3 FB Neck ROM: Limited    Dental   Pulmonary asthma ,    breath sounds clear to auscultation       Cardiovascular Exercise Tolerance: Poor hypertension, (-) angina(-) DOE  Rhythm:Regular Rate:Normal     Neuro/Psych  Headaches,  Neuromuscular disease (Carpal tunnel)    GI/Hepatic GERD  , Chron's   Endo/Other  diabetes  Renal/GU      Musculoskeletal  (+) Arthritis , Fibromyalgia -  Abdominal (+) + obese (BMI 31),   Peds  Hematology   Anesthesia Other Findings Breast cancer  Reproductive/Obstetrics                             Anesthesia Physical Anesthesia Plan  ASA: 2  Anesthesia Plan: MAC   Post-op Pain Management:    Induction: Intravenous  PONV Risk Score and Plan: 2 and TIVA, Midazolam and Treatment may vary due to age or medical condition  Airway Management Planned: Nasal Cannula  Additional Equipment:   Intra-op Plan:   Post-operative Plan:   Informed Consent: I have reviewed the patients History and Physical, chart, labs and discussed the procedure including the risks, benefits and alternatives for the proposed anesthesia with the patient or authorized representative who has indicated his/her understanding and acceptance.       Plan Discussed with: CRNA and Anesthesiologist  Anesthesia Plan Comments:         Anesthesia Quick Evaluation

## 2021-10-30 NOTE — Anesthesia Procedure Notes (Signed)
Procedure Name: MAC Date/Time: 10/30/2021 9:46 AM  Performed by: Dionne Bucy, CRNAPre-anesthesia Checklist: Patient identified, Emergency Drugs available, Suction available, Patient being monitored and Timeout performed Patient Re-evaluated:Patient Re-evaluated prior to induction Oxygen Delivery Method: Nasal cannula Placement Confirmation: positive ETCO2

## 2021-10-30 NOTE — Anesthesia Postprocedure Evaluation (Signed)
Anesthesia Post Note  Patient: Terri Woods  Procedure(s) Performed: CATARACT EXTRACTION PHACO AND INTRAOCULAR LENS PLACEMENT (IOC) RIGHT DIABETIC 1.85 00:20.9 (Right: Eye)     Patient location during evaluation: PACU Anesthesia Type: MAC Level of consciousness: awake and alert Pain management: pain level controlled Vital Signs Assessment: post-procedure vital signs reviewed and stable Respiratory status: spontaneous breathing, nonlabored ventilation, respiratory function stable and patient connected to nasal cannula oxygen Cardiovascular status: stable and blood pressure returned to baseline Postop Assessment: no apparent nausea or vomiting Anesthetic complications: no   No notable events documented.  Anisten Tomassi A  Xavion Muscat

## 2021-10-30 NOTE — H&P (Signed)
[facility]   Primary Care Physician:  Care, [place] Primary Ophthalmologist: Dr. Benay Pillow  Pre-Procedure History & Physical: HPI:  Terri Woods is a 51 y.o. female here for cataract surgery.   Past Medical History:  Diagnosis Date   Arthritis    neck, hips, fingers   Asthma    Back pain    Breast cancer ([place]) 2001   RT LUMPECTOMY PER PT   Carpal tunnel syndrome    right arm   Crohn's disease ([place])    Diabetes ([place])    Fibromyalgia    GERD (gastroesophageal reflux disease)    Glaucoma    Headache    Hypertension     Past Surgical History:  Procedure Laterality Date   BILATERAL CARPAL TUNNEL RELEASE Bilateral 02/05/2019   Procedure: BILATERAL CARPAL TUNNEL RELEASE;  Surgeon: Hessie Knows, MD;  Location: [facility] ORS;  Service: Orthopedics;  Laterality: Bilateral;   BREAST EXCISIONAL BIOPSY Right 15+ yrs ago   NEG   BREAST SURGERY     COLONOSCOPY WITH PROPOFOL N/A 03/10/2020   Procedure: COLONOSCOPY WITH PROPOFOL;  Surgeon: Benjamine Sprague, DO;  Location: [facility] ENDOSCOPY;  Service: General;  Laterality: N/A;   FOOT SURGERY Left    LYMPH NODE BIOPSY     NASAL SEPTOPLASTY W/ TURBINOPLASTY Bilateral 12/25/2018   Procedure: NASAL SEPTOPLASTY WITH INFERIOR TURBINATE REDUCTION;  Surgeon: Margaretha Sheffield, MD;  Location: [place];  Service: ENT;  Laterality: Bilateral;   SHOULDER SURGERY     SMALL INTESTINE SURGERY      Prior to Admission medications   Medication Sig Start Date End Date Taking? Authorizing Provider  albuterol (VENTOLIN HFA) 108 (90 Base) MCG/ACT inhaler Inhale 2 puffs into the lungs every 6 (six) hours as needed for wheezing or shortness of breath. Patient taking differently: Inhale 2 puffs into the lungs every 6 (six) hours as needed for wheezing or shortness of breath. Last used: 6 am. 05/26/21  Yes Covington, Sarah M, PA-C  atorvastatin (LIPITOR) 20 MG tablet Take 40 mg by mouth daily at 6 PM.   Yes [provider]  azelastine (ASTELIN)  0.1 % nasal spray Place into both nostrils 2 (two) times daily as needed for rhinitis. Use in each nostril as directed   Yes [provider]  bimatoprost (LUMIGAN) 0.01 % SOLN Place 1 drop into both eyes at bedtime.   Yes [provider]  brimonidine (ALPHAGAN) 0.2 % ophthalmic solution Place into both eyes in the morning and at bedtime.   Yes [provider]  clonazePAM (KLONOPIN) 1 MG tablet Take 1 mg by mouth 3 (three) times daily as needed.   Yes [provider]  cyclobenzaprine (FLEXERIL) 10 MG tablet Take 10 mg by mouth 3 (three) times daily as needed for muscle spasms.   Yes [provider]  docusate sodium (COLACE) 100 MG capsule Take 100 mg by mouth 2 (two) times daily as needed for mild constipation.   Yes [provider]  DULoxetine (CYMBALTA) 30 MG capsule Take 30 mg by mouth at bedtime. Take with 60 mg for total of 90 mg   Yes [provider]  DULoxetine (CYMBALTA) 60 MG capsule Take 60 mg by mouth at bedtime. Take with 30 mg for total of 90 mg.   Yes [provider]  ergocalciferol (VITAMIN D2) 1.25 MG (50000 UT) capsule Take 50,000 Units by mouth See admin instructions. Take 1 capsule (50000 units) by mouth once a week or once every other week  Yes [provider]  gabapentin (NEURONTIN) 300 MG capsule Take 1 capsule (300 mg total) by mouth 3 (three) times daily. 03/13/21  Yes Margarette Canada, NP  glipiZIDE (GLUCOTROL) 5 MG tablet Take 10 mg by mouth 2 (two) times daily.   Yes [provider]  ibuprofen (ADVIL) 800 MG tablet Take 800 mg by mouth every 8 (eight) hours as needed (pain.).   Yes [provider]  insulin glargine (LANTUS) 100 UNIT/ML injection Inject 40 Units into the skin at bedtime.   Yes [provider]  ketoconazole (NIZORAL) 2 % shampoo Apply 1 application topically 2 (two) times a week.   Yes [provider]  ketorolac (TORADOL) 10 MG tablet Take 1 tablet  (10 mg total) by mouth every 6 (six) hours as needed. 05/27/20  Yes Margarette Canada, NP  lidocaine (LIDODERM) 5 % Place 2 patches onto the skin daily. Remove & Discard patch within 12 hours or as directed by MD   Yes [provider]  lidocaine (XYLOCAINE) 5 % ointment Apply 1 application topically as needed (pain.).    Yes [provider]  lisinopril (ZESTRIL) 2.5 MG tablet Take 5 mg by mouth every evening.   Yes [provider]  miconazole (MICOTIN) 2 % cream Apply 1 application topically as needed (skin irritation/rash).   Yes [provider]  naloxone (NARCAN) nasal spray 4 mg/0.1 mL Place 1 spray into the nose as needed (accidental overdose).   Yes [provider]  omeprazole (PRILOSEC) 20 MG capsule Take 20 mg by mouth at bedtime.   Yes [provider]  ondansetron (ZOFRAN ODT) 8 MG disintegrating tablet Take 1 tablet (8 mg total) by mouth every 8 (eight) hours as needed for nausea or vomiting. 05/27/20  Yes Margarette Canada, NP  Oxycodone HCl 10 MG TABS Take 10 mg by mouth 3 (three) times daily.   Yes [provider]  oxyCODONE-acetaminophen (PERCOCET/ROXICET) 5-325 MG tablet Take 1 tablet by mouth every 4 (four) hours as needed (pain.).    Yes [provider]  QUEtiapine (SEROQUEL) 300 MG tablet Take 300 mg by mouth at bedtime.   Yes [provider]  triamcinolone (NASACORT) 55 MCG/ACT AERO nasal inhaler Place 2 sprays into the nose daily.   Yes [provider]  valACYclovir (VALTREX) 1000 MG tablet Take 1 tablet (1,000 mg total) by mouth 3 (three) times daily. 03/13/21  Yes Margarette Canada, NP  VICTOZA 18 MG/3ML SOPN SMARTSIG:0.6 Milligram(s) SUB-Q Daily 02/17/21  Yes [provider]  benzonatate (TESSALON) 100 MG capsule Take 1 capsule (100 mg total) by mouth every 8 (eight) hours. Patient not taking: Reported on 10/25/2021 05/26/21   Hughie Closs, PA-C  EPINEPHrine 0.3 mg/0.3 mL IJ SOAJ injection  Inject 0.3 mg into the muscle as needed for anaphylaxis.     [provider]  ipratropium (ATROVENT) 0.06 % nasal spray Place 2 sprays into both nostrils 4 (four) times daily. 06/01/21   Margarette Canada, NP  promethazine-dextromethorphan (PROMETHAZINE-DM) 6.25-15 MG/5ML syrup Take 5 mLs by mouth 4 (four) times daily as needed. Patient not taking: Reported on 10/25/2021 06/01/21   Margarette Canada, NP  SUMAtriptan (IMITREX) 100 MG tablet Take 100 mg by mouth every 2 (two) hours as needed for migraine. May repeat in 2 hours if headache persists or recurs.    [provider]  tiZANidine (ZANAFLEX) 4 MG tablet Take 1 tablet (4 mg total) by mouth every 8 (eight) hours as needed for muscle spasms. Patient not  taking: Reported on 10/25/2021 03/02/20   Coral Spikes, DO  triamcinolone cream (KENALOG) 0.1 % Apply 1 application topically 2 (two) times daily. 08/31/19   Norval Gable, MD  topiramate (TOPAMAX) 100 MG tablet Take 100 mg by mouth 2 (two) times daily.  01/06/20  [provider]    Allergies as of 10/12/2021 - Review Complete 06/01/2021  Allergen Reaction Noted   Levodopa Hives 09/29/2019   Rotigotine Hives 01/27/2020   Erythromycin Rash and Other (See Comments) 09/20/2014   Bee venom Swelling 09/20/2014   Doxycycline Rash 09/20/2014   Penicillins Hives and Rash 01/06/2020   Shellfish allergy Rash 09/20/2014    Family History  Problem Relation Age of Onset   Heart attack Sister 23   Heart disease Father    Breast cancer Mother    Breast cancer Maternal Aunt     Social History   Socioeconomic History   Marital status: Married    Spouse name: Not on file   Number of children: Not on file   Years of education: Not on file   Highest education level: Not on file  Occupational History   Not on file  Tobacco Use   Smoking status: Never   Smokeless tobacco: Never  Vaping Use   Vaping Use: Never used  Substance and Sexual Activity   Alcohol use: No     Alcohol/week: 0.0 standard drinks of alcohol   Drug use: Yes    Types: Oxycodone   Sexual activity: Yes    Birth control/protection: None    Comment: Married  Other Topics Concern   Not on file  Social History Narrative   Not on file   Social Determinants of Health   Financial Resource Strain: Not on file  Food Insecurity: Not on file  Transportation Needs: Not on file  Physical Activity: Not on file  Stress: Not on file  Social Connections: Not on file  Intimate Partner Violence: Not on file    Review of Systems: See HPI, otherwise negative ROS  Physical Exam: BP 136/81   Pulse 92   Temp (!) 97 F (36.1 C) (Temporal)   Ht '5\' 1"'$  (1.549 m)   Wt 75 kg   LMP 10/23/2021 (Exact Date)   SpO2 96%   BMI 31.23 kg/m  General:   Alert, cooperative in NAD Head:  Normocephalic and atraumatic. Respiratory:  Normal work of breathing. Cardiovascular:  RRR  Impression/Plan: Terri Woods is here for cataract surgery.  Risks, benefits, limitations, and alternatives regarding cataract surgery have been reviewed with the patient.  Questions have been answered.  All parties agreeable.   Benay Pillow, MD  10/30/2021, 9:34 AM

## 2021-10-31 ENCOUNTER — Encounter: Payer: Self-pay | Admitting: Ophthalmology

## 2021-12-18 ENCOUNTER — Other Ambulatory Visit: Payer: Self-pay | Admitting: Ophthalmology

## 2021-12-18 DIAGNOSIS — H469 Unspecified optic neuritis: Secondary | ICD-10-CM

## 2021-12-25 ENCOUNTER — Other Ambulatory Visit: Payer: Medicare Other

## 2022-01-05 IMAGING — CR DG CERVICAL SPINE COMPLETE 4+V
7 of 8 series · 8 of 9 positions shown · non-contrast
Comparison: None.

CLINICAL DATA: Fell from horse today.  Neck pain

EXAM:
CERVICAL SPINE - COMPLETE 4+ VIEW

[c-spine lat]
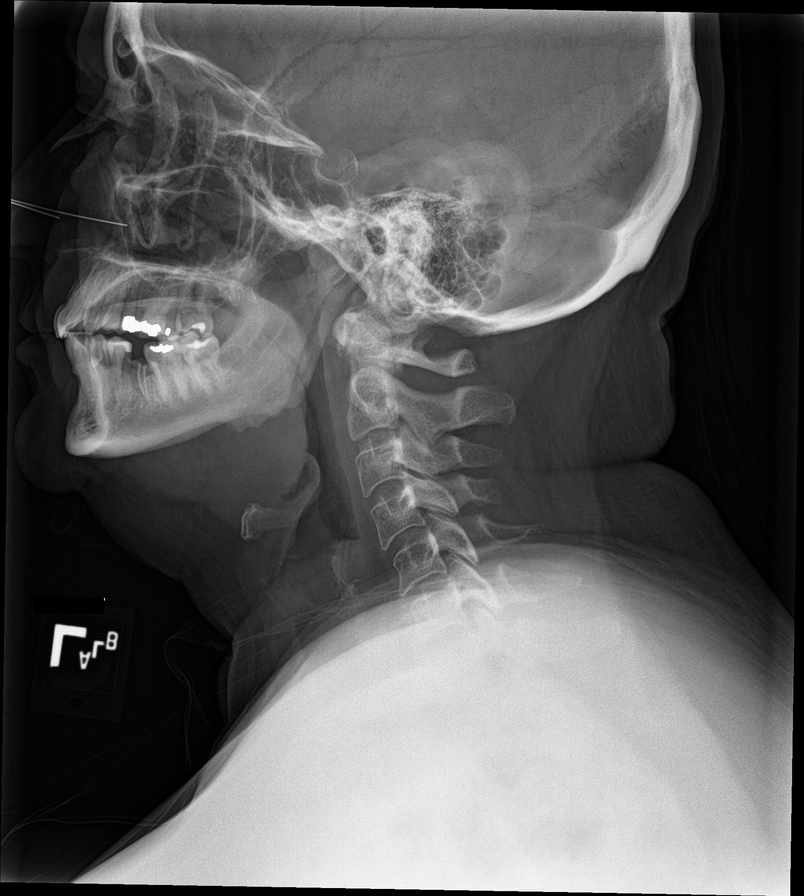

[c-spine obl (1 of 2)]
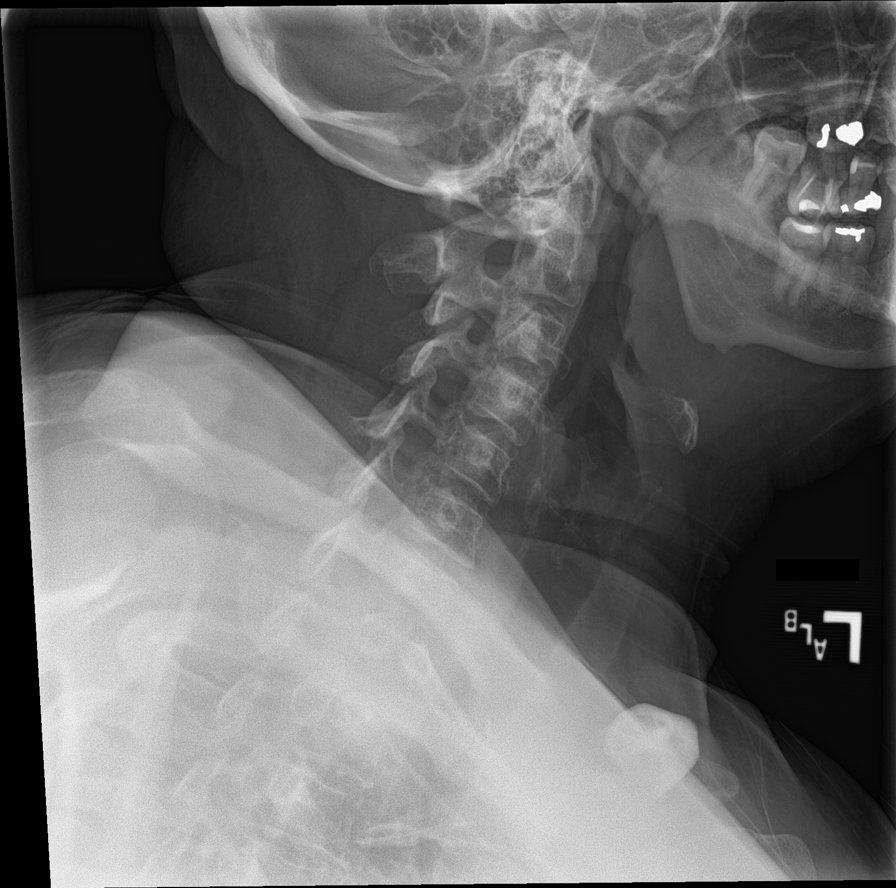

[c-spine obl (2 of 2)]
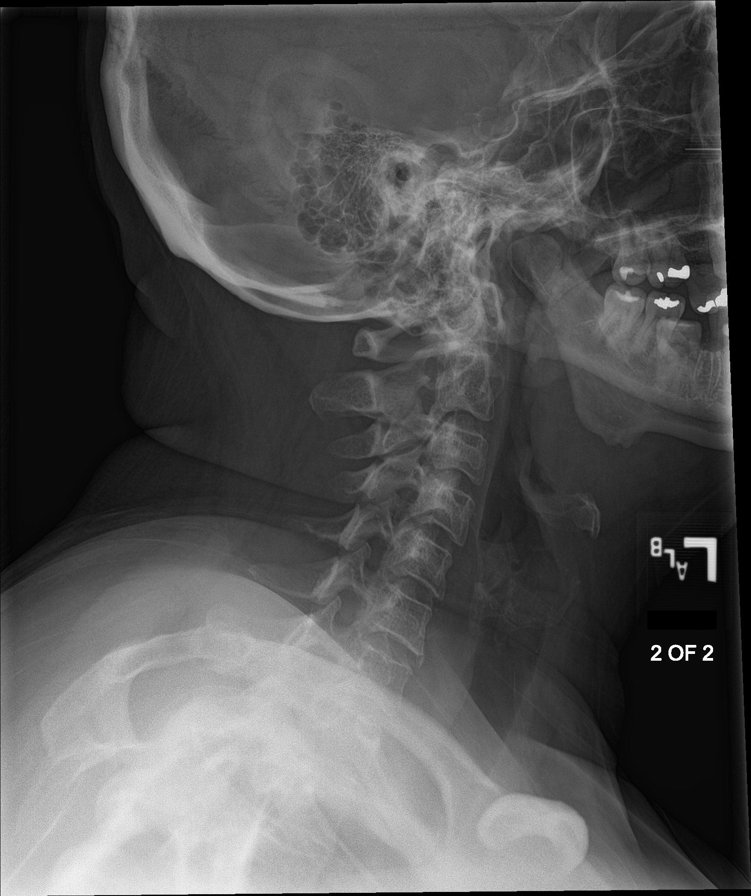

[Series 4: c-spine ap · 0.14mm/px · 2 of 2 slices shown]
[im 1/2]
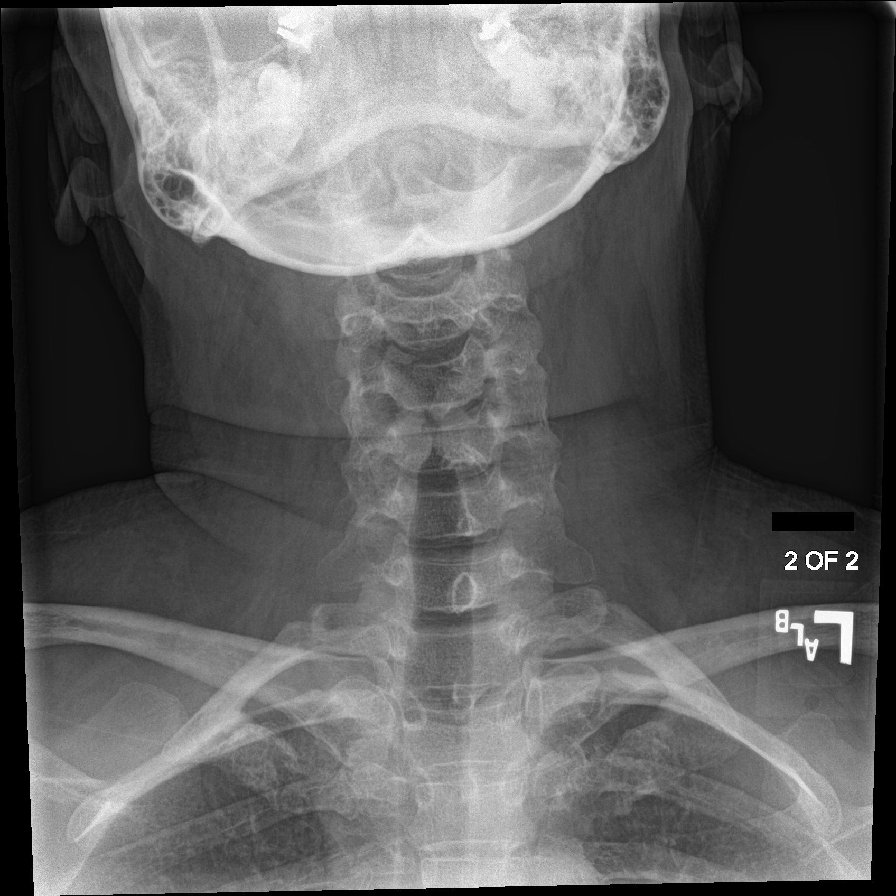
[im 2/2]
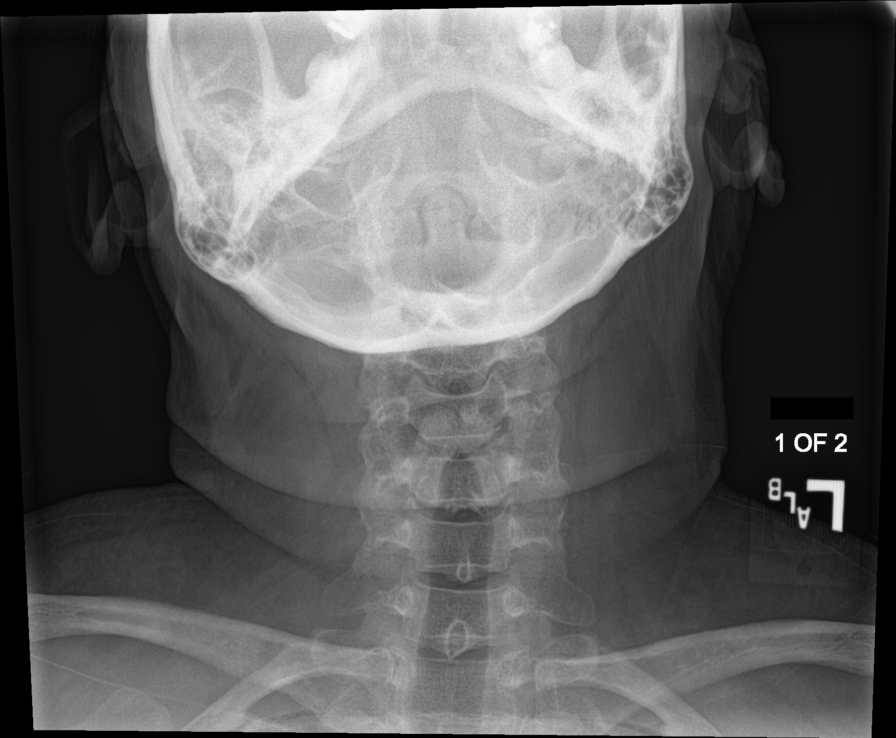

[c-spine open mouth]
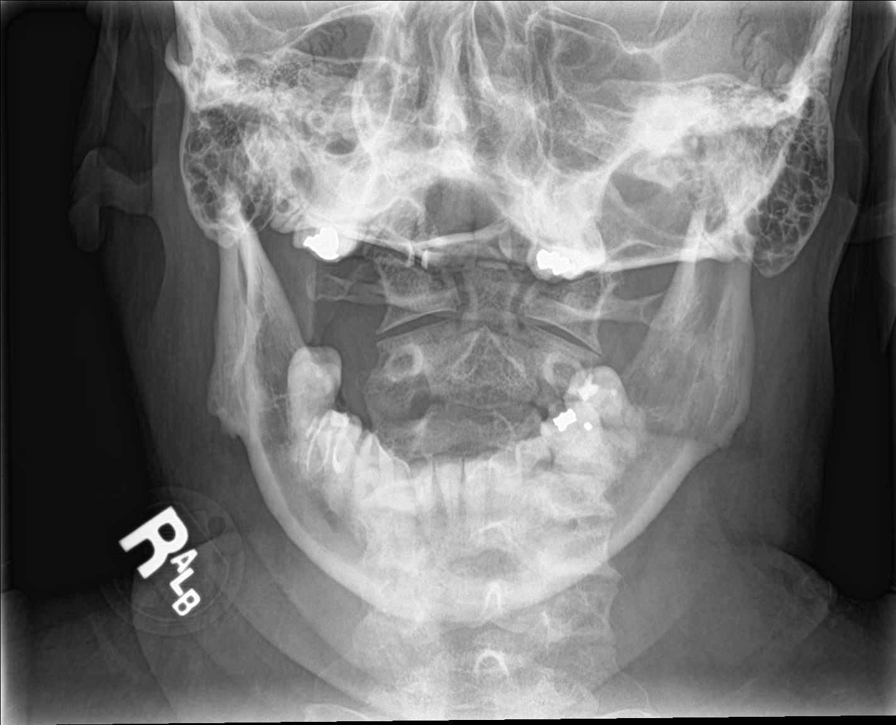

[[person_name]]
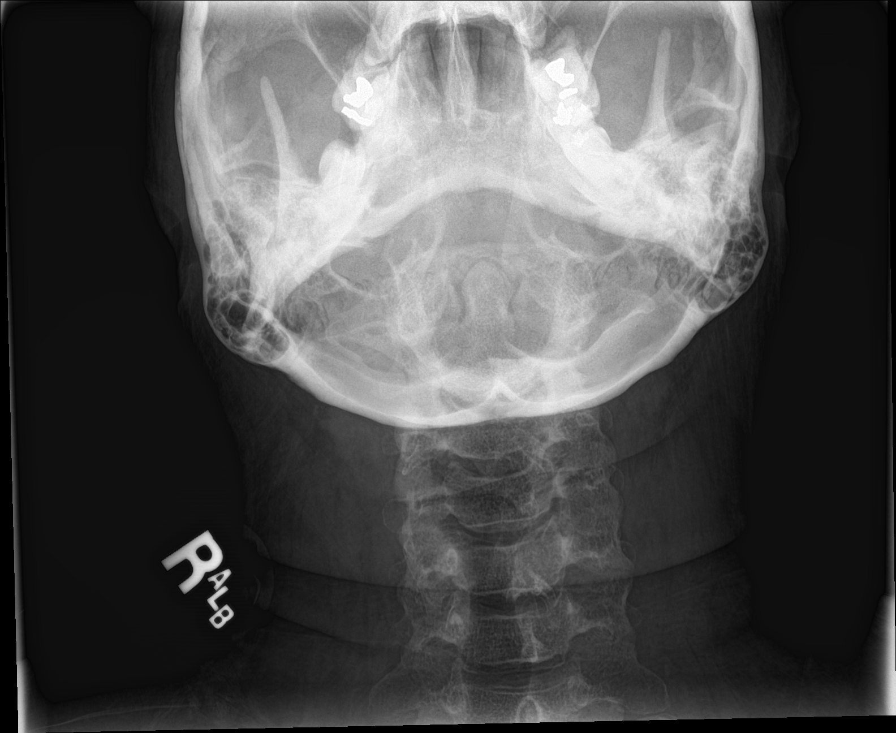

[ct-spine swimmers]
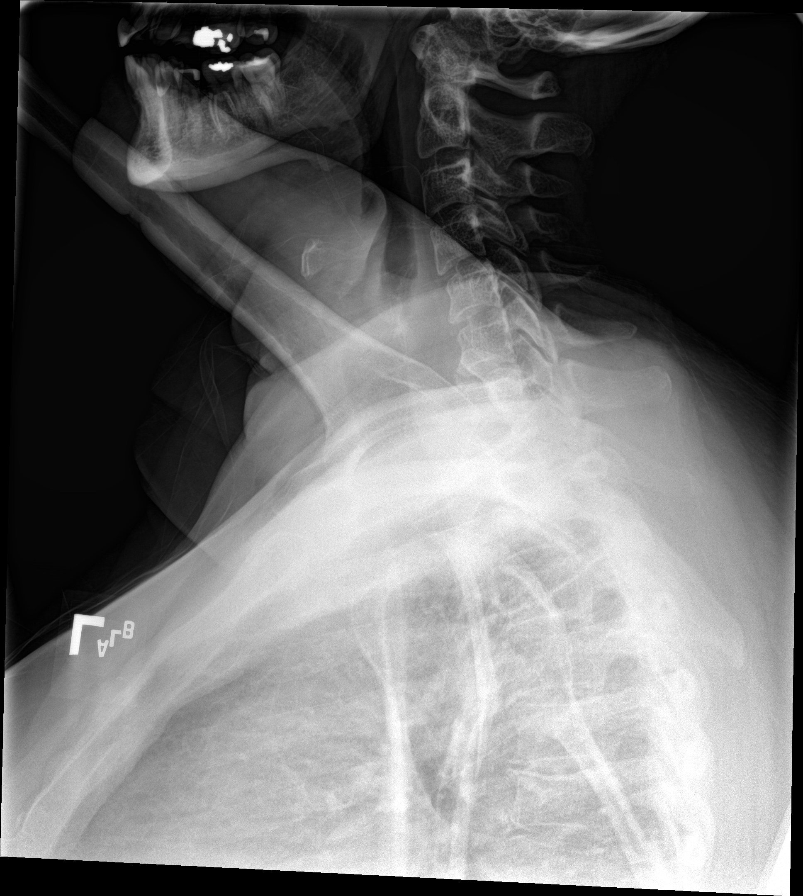

[8 of 9 positions shown; findings below may reference images not displayed]

FINDINGS: There is no evidence of cervical spine fracture or prevertebral soft
tissue swelling. Alignment is normal. No other significant bone
abnormalities are identified.
IMPRESSION: Negative cervical spine radiographs.

## 2022-02-10 IMAGING — CR DG CHEST 2V
2 series · 2 of 2 positions shown · non-contrast
Comparison: 05/26/2021

CLINICAL DATA: COVID diagnosed 6 days ago, increased shortness of
breath and cough

EXAM:
CHEST - 2 VIEW

[chest pa]
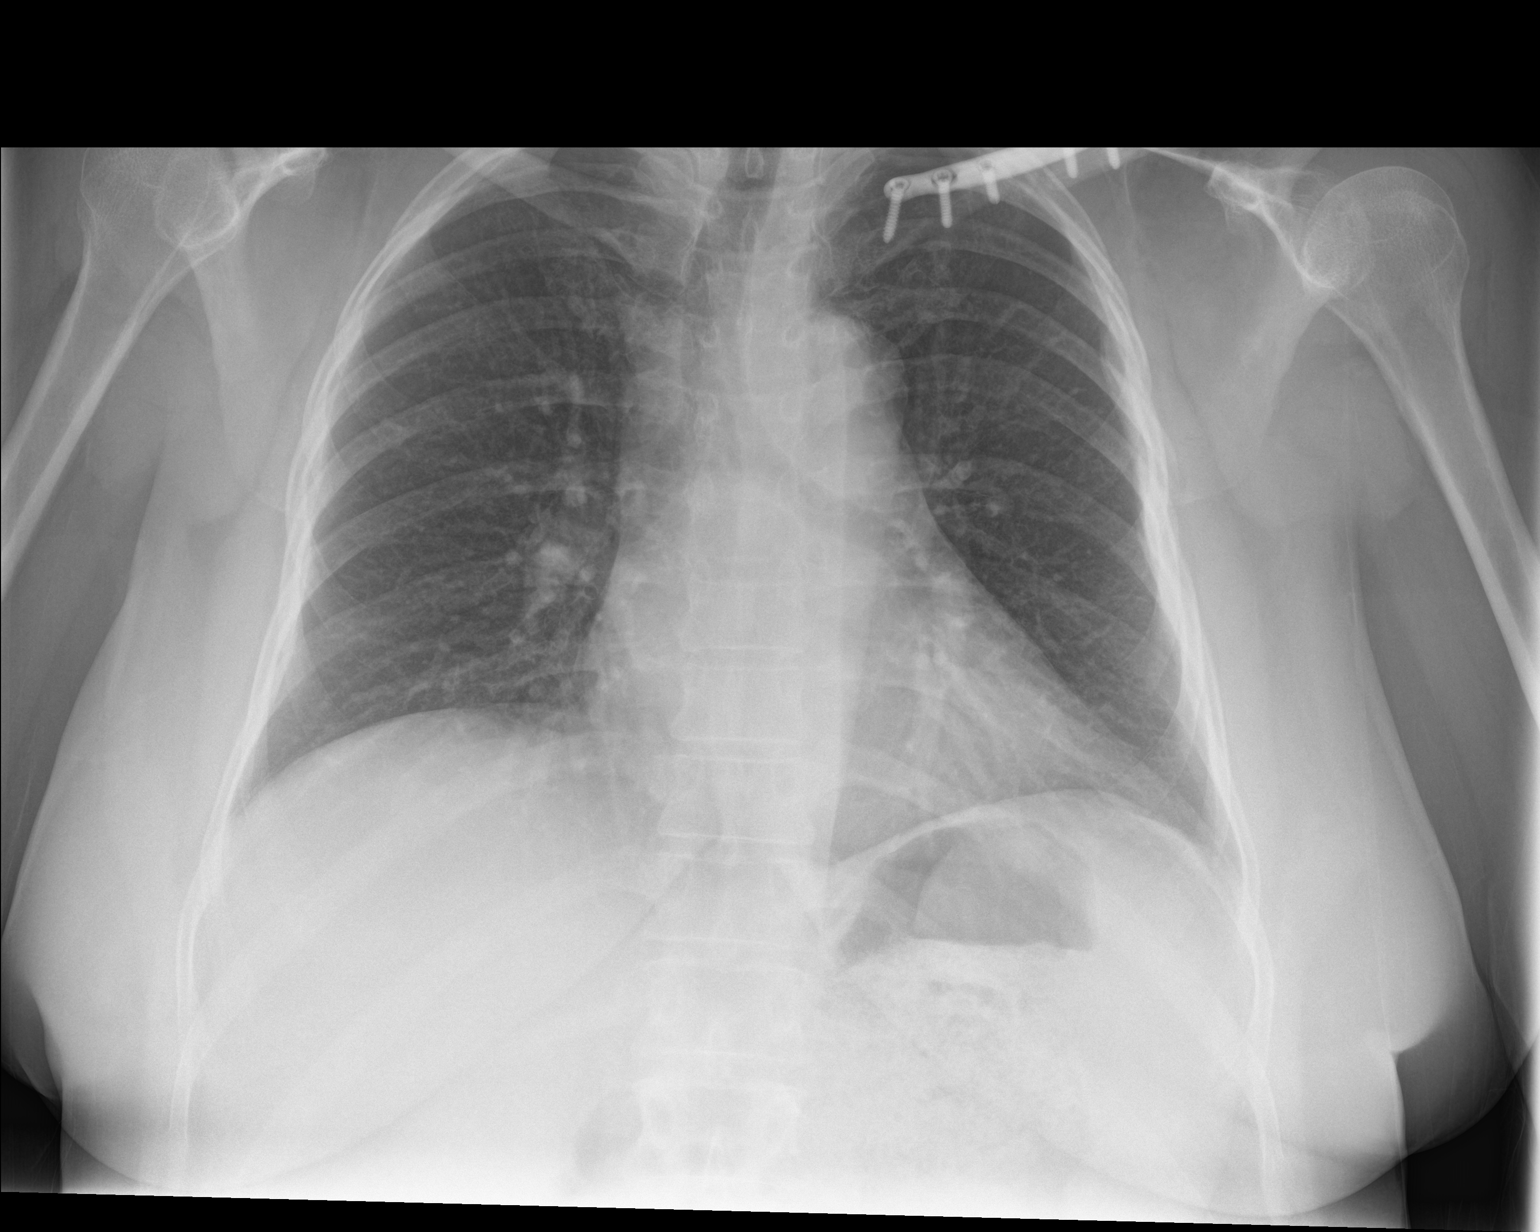

[chest lat]
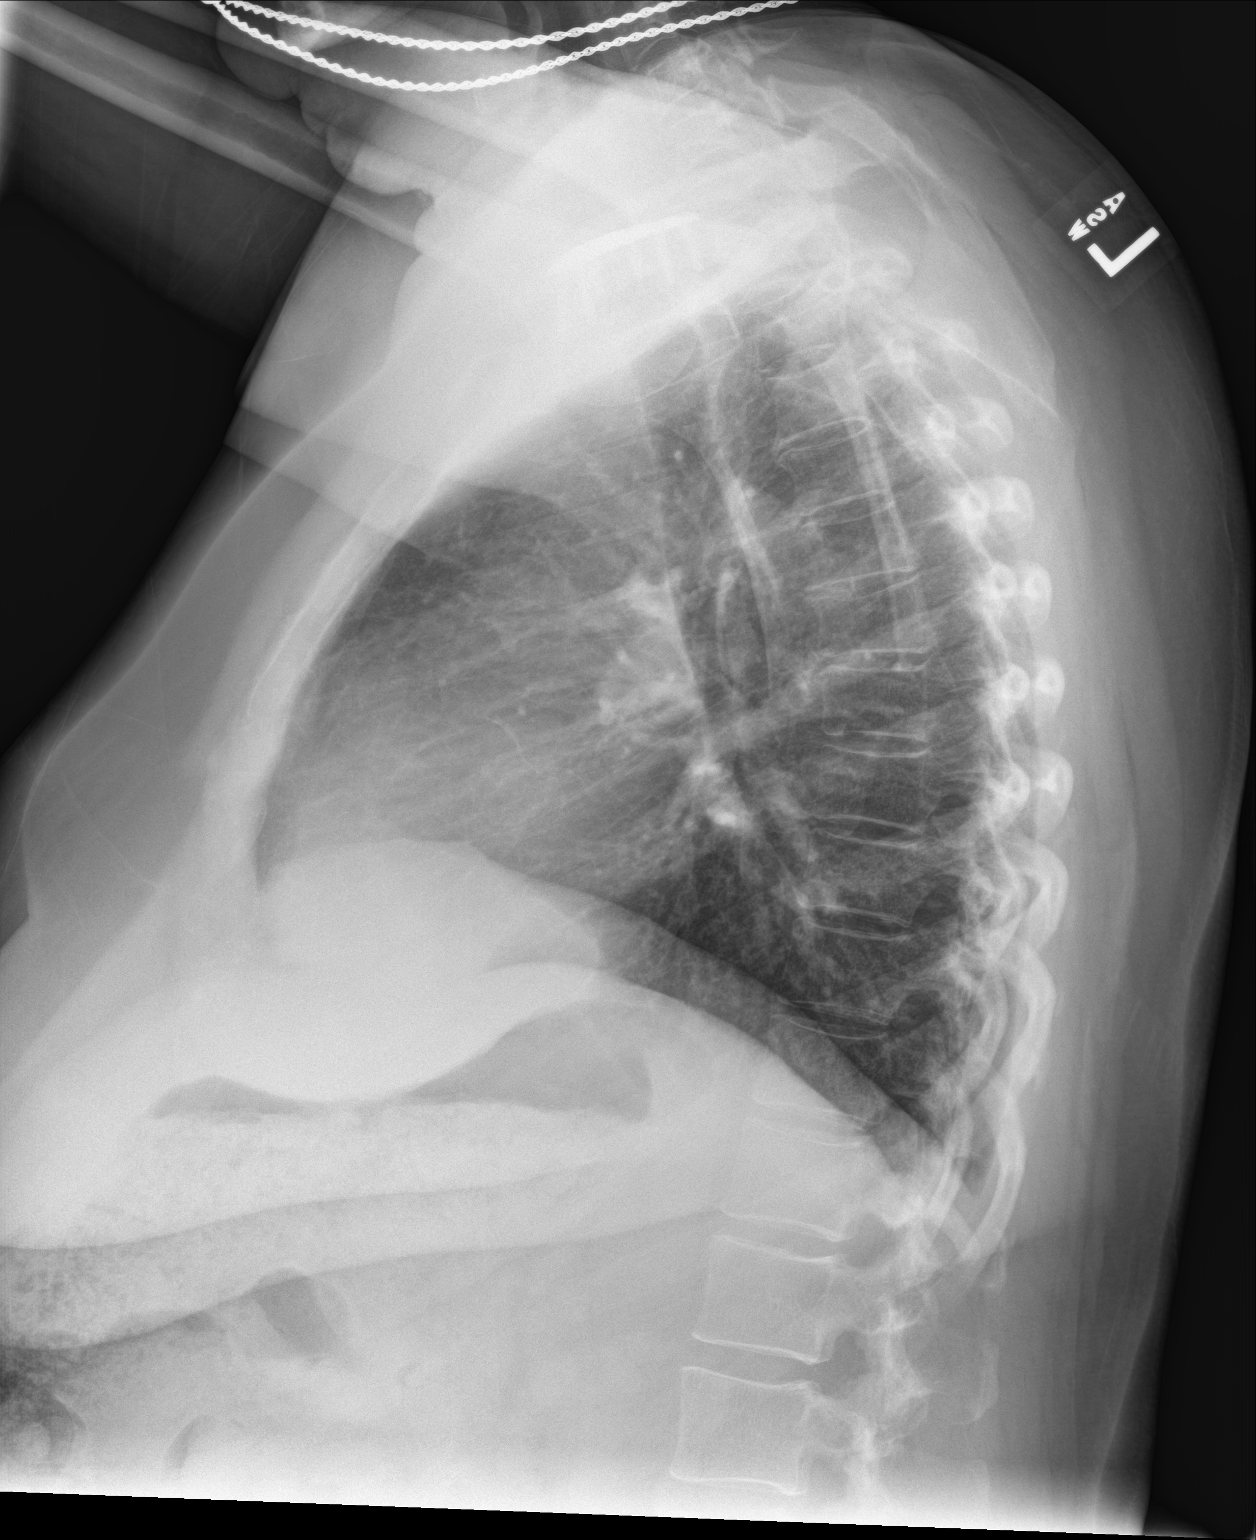

[2 of 2 positions shown; findings below may reference images not displayed]

FINDINGS: Frontal and lateral views of the chest demonstrate an unremarkable
cardiac silhouette. No airspace disease, effusion, or pneumothorax.
Stable postsurgical changes left clavicle ORIF. Prior healed left
rib fractures. No acute bony abnormality.
IMPRESSION: 1. No acute intrathoracic process.

## 2022-02-20 ENCOUNTER — Ambulatory Visit
Admission: EM | Admit: 2022-02-20 | Discharge: 2022-02-20 | Disposition: A | Discharge status: Home / Self Care | Payer: Medicare Other | Attending: Family Medicine | Admitting: Family Medicine

## 2022-02-20 ENCOUNTER — Telehealth: Payer: Self-pay | Admitting: Family Medicine

## 2022-02-20 ENCOUNTER — Encounter: Payer: Self-pay | Admitting: Emergency Medicine

## 2022-02-20 DIAGNOSIS — J069 Acute upper respiratory infection, unspecified: Secondary | ICD-10-CM | POA: Diagnosis present

## 2022-02-20 DIAGNOSIS — U071 COVID-19: Secondary | ICD-10-CM | POA: Insufficient documentation

## 2022-02-20 DIAGNOSIS — Z20822 Contact with and (suspected) exposure to covid-19: Secondary | ICD-10-CM | POA: Insufficient documentation

## 2022-02-20 LAB — RESP PANEL BY RT-PCR (FLU A&B, COVID) ARPGX2
Influenza A by PCR: NEGATIVE
Influenza B by PCR: NEGATIVE
SARS Coronavirus 2 by RT PCR: POSITIVE — AB

## 2022-02-20 MED ORDER — BENZONATATE 100 MG PO CAPS: 100.0000 mg | ORAL_CAPSULE | Freq: Three times a day (TID) | ORAL | 0 refills | Status: DC

## 2022-02-20 MED ORDER — MOLNUPIRAVIR EUA 200MG CAPSULE
4.0000 | ORAL_CAPSULE | Freq: Two times a day (BID) | ORAL | 0 refills | Status: AC
Start: 2022-02-20 — End: 2022-02-25

## 2022-02-20 MED ORDER — PROMETHAZINE-DM 6.25-15 MG/5ML PO SYRP
5.0000 mL | ORAL_SOLUTION | Freq: Four times a day (QID) | ORAL | 0 refills | Status: DC | PRN
Start: 2022-02-20 — End: 2022-04-06

## 2022-02-20 NOTE — Discharge Instructions (Signed)
Stop by the pharmacy to pick up your prescriptions.    We will contact you if your COVID or influenza test is positive.  Please quarantine while you wait for the results.  If your test is negative you may resume normal activities.  If your test is positive please continue to quarantine for at least 5 days from your symptom onset or until you are without a fever for at least 24 hours after the medications.   You can take Tylenol and/or Ibuprofen as needed for fever reduction and pain relief.    For cough: honey 1/2 to 1 teaspoon (you can dilute the honey in water or another fluid).  You can also use guaifenesin and dextromethorphan for cough. You can use a humidifier for chest congestion and cough.  If you don't have a humidifier, you can sit in the bathroom with the hot shower running.      For sore throat: try warm salt water gargles, cepacol lozenges, throat spray, warm tea or water with lemon/honey, popsicles or ice, or OTC cold relief medicine for throat discomfort.    For congestion: take a daily anti-histamine like Zyrtec, Claritin, and a oral decongestant, such as pseudoephedrine.  You can also use Flonase 1-2 sprays in each nostril daily.    It is important to stay hydrated: drink plenty of fluids (water, gatorade/powerade/pedialyte, juices, or teas) to keep your throat moisturized and help further relieve irritation/discomfort.    Return or go to the Emergency Department if symptoms worsen or do not improve in the next few days

## 2022-02-20 NOTE — ED Triage Notes (Signed)
Pt presents with cough, runny nose, nausea, dizziness and sinus pressure that started this morning. Pt has tried sudafed for her symptoms.

## 2022-02-20 NOTE — Telephone Encounter (Signed)
Call patient to alert her that her COVID test was positive.  Quarantine and isolation instructions provided.  After shared decision making, we will treat with molnupiravir as she is in the treatment window.  All questions asked were answered.  Lyndee Hensen, DO  02/20/22 7:00 PM

## 2022-02-20 NOTE — ED Provider Notes (Addendum)
[facility]    CSN: [id number] Arrival date & time: 02/20/22  1431      History   Chief Complaint Chief Complaint  Patient presents with   Nausea   Dizziness   Cough   Sinus Pressure     HPI Terri Woods is a 51 y.o. female.   HPI   Terri Woods presents for cough, runny nose, nausea, sinus pressure, headache and intermittent dizziness that started this morning.  She has been using Motrin and Sudafed without relief of her symptoms.  She has history of asthma and has been using her inhaler more than she usually does.  She last used the inhaler this morning.  Denies recent sick contacts.  He has some nausea and has been dry heaving.  No vomiting or diarrhea.  Took Dramamine without relief of nausea. She has intermittent abdominal cramping.  She has not taken her temperature but she has been feeling hot.  Experiencing chills.  Has productive cough.  Endorses myalgias and decreased appetite.        Past Medical History:  Diagnosis Date   Arthritis    neck, hips, fingers   Asthma    Back pain    Breast cancer ([place]) 2001   RT LUMPECTOMY PER PT   Carpal tunnel syndrome    right arm   Crohn's disease ([place])    Diabetes ([place])    Fibromyalgia    GERD (gastroesophageal reflux disease)    Glaucoma    Headache    Hypertension     There are no problems to display for this patient.   Past Surgical History:  Procedure Laterality Date   BILATERAL CARPAL TUNNEL RELEASE Bilateral 02/05/2019   Procedure: BILATERAL CARPAL TUNNEL RELEASE;  Surgeon: Hessie Knows, MD;  Location: [facility];  Service: Orthopedics;  Laterality: Bilateral;   BREAST EXCISIONAL BIOPSY Right 15+ yrs ago   NEG   BREAST SURGERY     CATARACT EXTRACTION W/PHACO Right 10/30/2021   Procedure: CATARACT EXTRACTION PHACO AND INTRAOCULAR LENS PLACEMENT (IOC) RIGHT DIABETIC 1.85 00:20.9;  Surgeon: Eulogio Bear, MD;  Location: [place];  Service: Ophthalmology;  Laterality: Right;  Diabetic    COLONOSCOPY WITH PROPOFOL N/A 03/10/2020   Procedure: COLONOSCOPY WITH PROPOFOL;  Surgeon: Benjamine Sprague, DO;  Location: [facility];  Service: General;  Laterality: N/A;   FOOT SURGERY Left    LYMPH NODE BIOPSY     NASAL SEPTOPLASTY W/ TURBINOPLASTY Bilateral 12/25/2018   Procedure: NASAL SEPTOPLASTY WITH INFERIOR TURBINATE REDUCTION;  Surgeon: Margaretha Sheffield, MD;  Location: [place];  Service: ENT;  Laterality: Bilateral;   SHOULDER SURGERY     SMALL INTESTINE SURGERY      OB History   No obstetric history on file.      Home Medications    Prior to Admission medications   Medication Sig Start Date End Date Taking? Authorizing Provider  albuterol (VENTOLIN HFA) 108 (90 Base) MCG/ACT inhaler Inhale 2 puffs into the lungs every 6 (six) hours as needed for wheezing or shortness of breath. Patient taking differently: Inhale 2 puffs into the lungs every 6 (six) hours as needed for wheezing or shortness of breath. Last used: 6 am. 05/26/21   Hughie Closs, PA-C  atorvastatin (LIPITOR) 20 MG tablet Take 40 mg by mouth daily at 6 PM.    [provider]  azelastine (ASTELIN) 0.1 % nasal spray Place into both nostrils 2 (two) times daily as needed for rhinitis. Use in each nostril as  directed    [provider]  benzonatate (TESSALON) 100 MG capsule Take 1 capsule (100 mg total) by mouth every 8 (eight) hours. 02/20/22   Sayed Apostol, DO  bimatoprost (LUMIGAN) 0.01 % SOLN Place 1 drop into both eyes at bedtime.    [provider]  brimonidine (ALPHAGAN) 0.2 % ophthalmic solution Place into both eyes in the morning and at bedtime.    [provider]  clonazePAM (KLONOPIN) 1 MG tablet Take 1 mg by mouth 3 (three) times daily as needed.    [provider]  cyclobenzaprine (FLEXERIL) 10 MG tablet Take 10 mg by mouth 3 (three) times daily as needed for muscle spasms.    [provider]  docusate sodium (COLACE) 100 MG capsule Take  100 mg by mouth 2 (two) times daily as needed for mild constipation.    [provider]  DULoxetine (CYMBALTA) 30 MG capsule Take 30 mg by mouth at bedtime. Take with 60 mg for total of 90 mg    [provider]  DULoxetine (CYMBALTA) 60 MG capsule Take 60 mg by mouth at bedtime. Take with 30 mg for total of 90 mg.    [provider]  EPINEPHrine 0.3 mg/0.3 mL IJ SOAJ injection Inject 0.3 mg into the muscle as needed for anaphylaxis.     [provider]  ergocalciferol (VITAMIN D2) 1.25 MG (50000 UT) capsule Take 50,000 Units by mouth See admin instructions. Take 1 capsule (50000 units) by mouth once a week or once every other week    [provider]  gabapentin (NEURONTIN) 300 MG capsule Take 1 capsule (300 mg total) by mouth 3 (three) times daily. 03/13/21   Margarette Canada, NP  glipiZIDE (GLUCOTROL) 5 MG tablet Take 10 mg by mouth 2 (two) times daily.    [provider]  ibuprofen (ADVIL) 800 MG tablet Take 800 mg by mouth every 8 (eight) hours as needed (pain.).    [provider]  insulin glargine (LANTUS) 100 UNIT/ML injection Inject 40 Units into the skin at bedtime.    [provider]  ipratropium (ATROVENT) 0.06 % nasal spray Place 2 sprays into both nostrils 4 (four) times daily. 06/01/21   Margarette Canada, NP  ketoconazole (NIZORAL) 2 % shampoo Apply 1 application topically 2 (two) times a week.    [provider]  ketorolac (TORADOL) 10 MG tablet Take 1 tablet (10 mg total) by mouth every 6 (six) hours as needed. 05/27/20   Margarette Canada, NP  lidocaine (LIDODERM) 5 % Place 2 patches onto the skin daily. Remove & Discard patch within 12 hours or as directed by MD    [provider]  lidocaine (XYLOCAINE) 5 % ointment Apply 1 application topically as needed (pain.).     [provider]  lisinopril (ZESTRIL) 2.5 MG tablet Take 5 mg by mouth every evening.    [provider]  miconazole (MICOTIN)  2 % cream Apply 1 application topically as needed (skin irritation/rash).    [provider]  naloxone [facility]) nasal spray 4 mg/0.1 mL Place 1 spray into the nose as needed (accidental overdose).    [provider]  omeprazole (PRILOSEC) 20 MG capsule Take 20 mg by mouth at bedtime.    [provider]  ondansetron (ZOFRAN ODT) 8 MG disintegrating tablet Take 1 tablet (8 mg total) by mouth every 8 (eight) hours as needed for nausea or vomiting. 05/27/20   Margarette Canada, NP  Oxycodone HCl 10 MG  TABS Take 10 mg by mouth 3 (three) times daily.    [provider]  oxyCODONE-acetaminophen (PERCOCET/ROXICET) 5-325 MG tablet Take 1 tablet by mouth every 4 (four) hours as needed (pain.).     [provider]  promethazine-dextromethorphan (PROMETHAZINE-DM) 6.25-15 MG/5ML syrup Take 5 mLs by mouth 4 (four) times daily as needed. 02/20/22   Kaiyon Hynes, Ronnette Juniper, DO  QUEtiapine (SEROQUEL) 300 MG tablet Take 300 mg by mouth at bedtime.    [provider]  SUMAtriptan (IMITREX) 100 MG tablet Take 100 mg by mouth every 2 (two) hours as needed for migraine. May repeat in 2 hours if headache persists or recurs.    [provider]  tiZANidine (ZANAFLEX) 4 MG tablet Take 1 tablet (4 mg total) by mouth every 8 (eight) hours as needed for muscle spasms. Patient not taking: Reported on 10/25/2021 03/02/20   Coral Spikes, DO  triamcinolone (NASACORT) 55 MCG/ACT AERO nasal inhaler Place 2 sprays into the nose daily.    [provider]  triamcinolone cream (KENALOG) 0.1 % Apply 1 application topically 2 (two) times daily. 08/31/19   Norval Gable, MD  valACYclovir (VALTREX) 1000 MG tablet Take 1 tablet (1,000 mg total) by mouth 3 (three) times daily. 03/13/21   Margarette Canada, NP  VICTOZA 18 MG/3ML SOPN SMARTSIG:0.6 Milligram(s) SUB-Q Daily 02/17/21   [provider]  topiramate (TOPAMAX) 100 MG tablet Take 100 mg by mouth 2 (two) times daily.  01/06/20   [provider]    Family History Family History  Problem Relation Age of Onset   Heart attack Sister 4   Heart disease Father    Breast cancer Mother    Breast cancer Maternal Aunt     Social History Social History   Tobacco Use   Smoking status: Never   Smokeless tobacco: Never  Vaping Use   Vaping Use: Never used  Substance Use Topics   Alcohol use: No    Alcohol/week: 0.0 standard drinks of alcohol   Drug use: Yes    Types: Oxycodone     Allergies   Levodopa, Lyrica [pregabalin], Other, Rotigotine, Erythromycin, Bee venom, Fentanyl, Hysingla er [hydrocodone bitartrate er], Meloxicam, Doxycycline, Morphine, Penicillins, and Shellfish allergy   Review of Systems Review of Systems: negative unless otherwise stated in HPI.      Physical Exam Triage Vital Signs ED Triage Vitals  Enc Vitals Group     BP 02/20/22 1552 121/77     Pulse Rate 02/20/22 1552 (!) 102     Resp 02/20/22 1552 16     Temp 02/20/22 1552 99.3 F (37.4 C)     Temp Source 02/20/22 1552 Oral     SpO2 02/20/22 1552 95 %     Weight --      Height --      Head Circumference --      Peak Flow --      Pain Score 02/20/22 1549 8     Pain Loc --      Pain Edu? --      Excl. in [place]? --    No data found.  Updated Vital Signs BP 121/77 (BP Location: Left Arm)   Pulse (!) 102   Temp 99.3 F (37.4 C) (Oral)   Resp 16   SpO2 95%   Visual Acuity Right Eye Distance:   Left Eye Distance:   Bilateral Distance:    Right Eye Near:   Left Eye Near:    Bilateral Near:  Physical Exam GEN:     alert, ill appearing female, resting on exam table    HENT:  mucus membranes moist, oropharyngeal  without lesions or  exudate, no  tonsillar hypertrophy, mild oropharyngeal erythema,   moderate erythematous edematous turbinates,  clear nasal discharge EYES:   pupils equal and reactive, EOMi , bilateral scleral injection NECK:  normal ROM, anterior cervical lymphadenopathy, no  meningismus RESP:  no increased work of breathing,  clear to auscultation bilaterally CVS:   regular rate  and rhythm Skin:   warm and dry, no rash on visible skin , normal  skin turgor    UC Treatments / Results  Labs (all labs ordered are listed, but only abnormal results are displayed) Labs Reviewed  RESP PANEL BY RT-PCR (FLU A&B, COVID) ARPGX2 - Abnormal; Notable for the following components:      Result Value   SARS Coronavirus 2 by RT PCR POSITIVE (*)    All other components within normal limits    EKG   Radiology No results found.  Procedures Procedures (including critical care time)  Medications Ordered in UC Medications - No data to display  Initial Impression / Assessment and Plan / UC Course  I have reviewed the triage vital signs and the nursing notes.  Pertinent labs & imaging results that were available during my care of the patient were reviewed by me and considered in my medical decision making (see chart for details).       Pt is a 51 y.o. female with hx of T2DM, asthma who presents for 1 day of respiratory symptoms. Terri Woods has elevated temperature, 99.73F.  Satting adequately, on room air. Pt is ill-appearing but well hydrated, without respiratory distress. Pulmonary exam  is unremarkable. Influenza and COVID testing obtained.  Pt to quarantine until COVID test results or longer if positive.  I will call patient with test results are positive. History consistent with  viral respiratory illness. Discussed symptomatic treatment.  Promethazine DM and Tessalon Perles for cough as she has been unable to sleep due to cough. Explained lack of efficacy of antibiotics in viral disease.  Typical duration of symptoms discussed.  Return and ED precautions given and patient voiced understanding. - continue age-appropriate Tylenol/ Motrin as needed for discomfort/fever - nasal saline to help with his nasal congestion - Use a mist humidifier to help with breathing - Stressed  importance of hydration - Albuterol to help with chest tightness   - Promethazine for nausea   - Work note provided - Discussed return and ED precautions, understanding voiced.   Patient called and updated with positive COVID results. Treat with molnupiravir.   Discussed MDM, treatment plan and plan for follow-up with patient who agrees with plan.     Final Clinical Impressions(s) / UC Diagnoses   Final diagnoses:  Viral URI with cough  Encounter for laboratory testing for COVID-19 virus  COVID-19     Discharge Instructions      Stop by the pharmacy to pick up your prescriptions.    We will contact you if your COVID or influenza test is positive.  Please quarantine while you wait for the results.  If your test is negative you may resume normal activities.  If your test is positive please continue to quarantine for at least 5 days from your symptom onset or until you are without a fever for at least 24 hours after the medications.   You can take Tylenol and/or Ibuprofen as needed for fever reduction and  pain relief.    For cough: honey 1/2 to 1 teaspoon (you can dilute the honey in water or another fluid).  You can also use guaifenesin and dextromethorphan for cough. You can use a humidifier for chest congestion and cough.  If you don't have a humidifier, you can sit in the bathroom with the hot shower running.      For sore throat: try warm salt water gargles, cepacol lozenges, throat spray, warm tea or water with lemon/honey, popsicles or ice, or OTC cold relief medicine for throat discomfort.    For congestion: take a daily anti-histamine like Zyrtec, Claritin, and a oral decongestant, such as pseudoephedrine.  You can also use Flonase 1-2 sprays in each nostril daily.    It is important to stay hydrated: drink plenty of fluids (water, gatorade/powerade/pedialyte, juices, or teas) to keep your throat moisturized and help further relieve irritation/discomfort.    Return or go  to the Emergency Department if symptoms worsen or do not improve in the next few days      ED Prescriptions     Medication Sig Dispense Auth. Provider   benzonatate (TESSALON) 100 MG capsule Take 1 capsule (100 mg total) by mouth every 8 (eight) hours. 21 capsule Chabeli Barsamian, DO   promethazine-dextromethorphan (PROMETHAZINE-DM) 6.25-15 MG/5ML syrup Take 5 mLs by mouth 4 (four) times daily as needed. 118 mL Lyndee Hensen, DO      PDMP not reviewed this encounter.   Lyndee Hensen, DO 02/20/22 [place], [place], DO 02/20/22 1902

## 2022-04-01 IMAGING — CR DG CHEST 2V
2 series · 2 of 2 positions shown · non-contrast
Comparison: Chest radiographs 03/11/2014.  CT 06/08/2012.

CLINICAL DATA: Left shoulder and clavicle pain after being thrown
from a horse today.

EXAM:
CHEST - 2 VIEW

[chest pa]
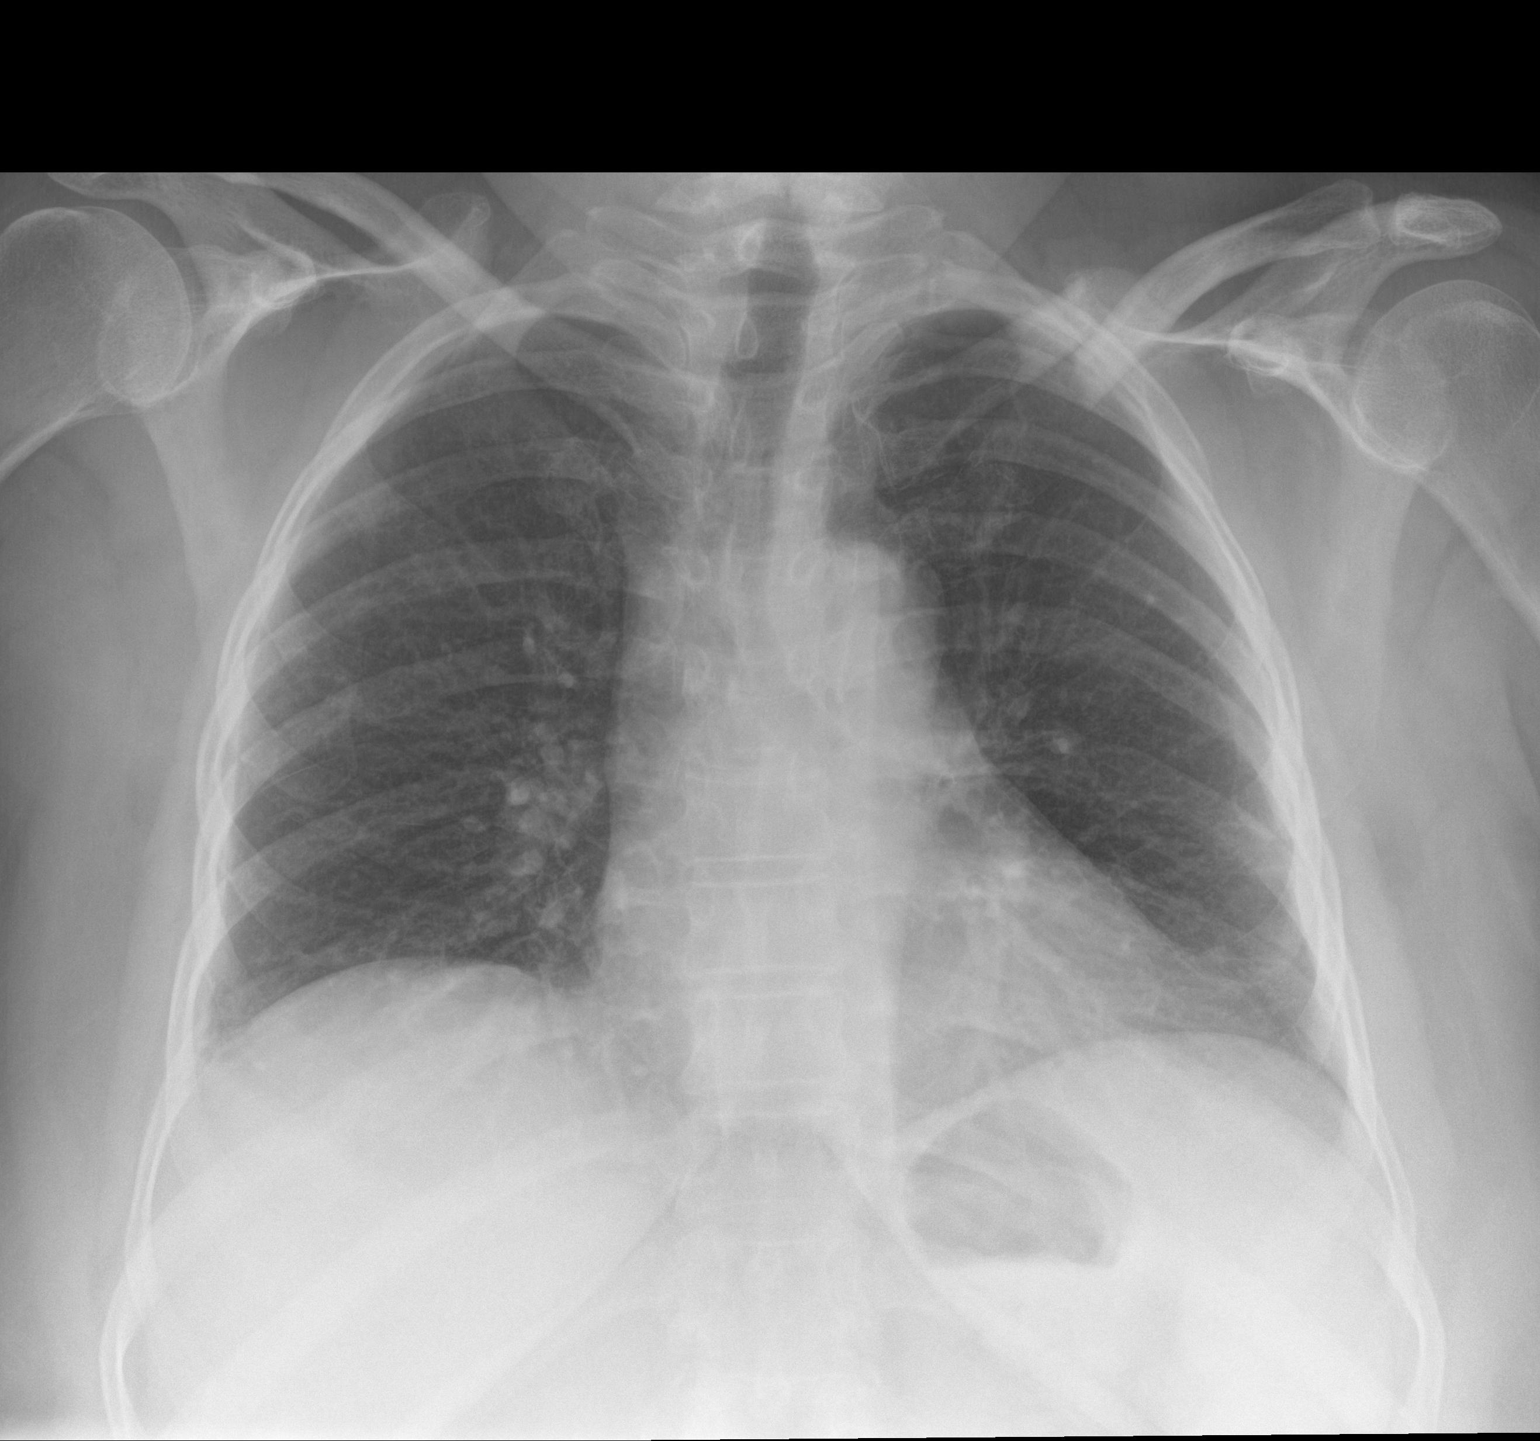

[chest lat]
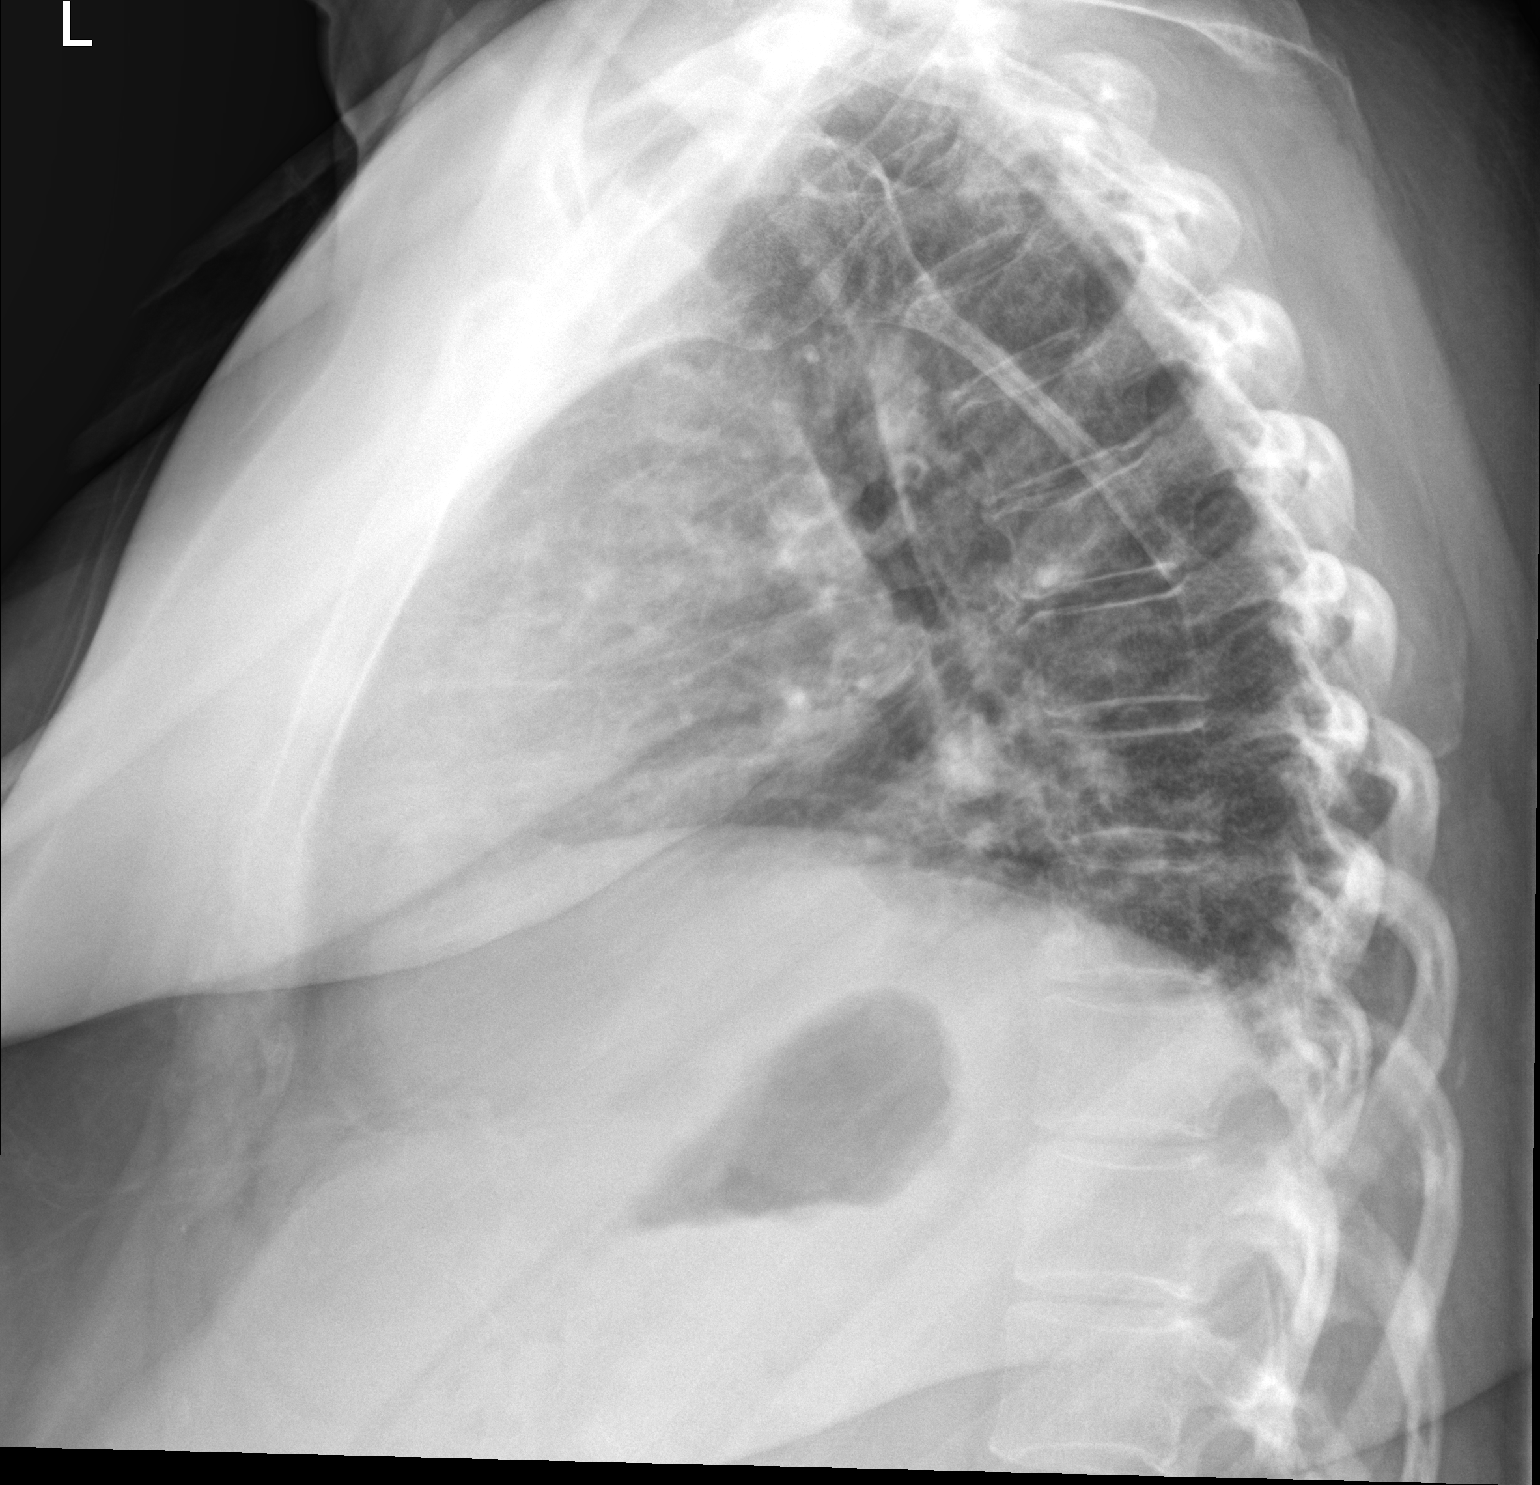

[2 of 2 positions shown; findings below may reference images not displayed]

FINDINGS: The heart size and mediastinal contours are stable without evidence
of mediastinal hematoma. There are lower lung volumes with mild
bibasilar atelectasis and probable underlying central airway
thickening. There is no pleural effusion or pneumothorax. Mildly
displaced acute fracture of the mid left clavicle is noted. No
definite acute rib fractures identified.
IMPRESSION: Mildly displaced acute fracture of the mid left clavicle. No
evidence of mediastinal hematoma or pneumothorax.

## 2022-04-01 IMAGING — CR DG SHOULDER 2+V*L*
3 series · 3 of 3 positions shown · non-contrast
Comparison: Left clavicle radiographs 03/02/2020.

CLINICAL DATA: Left shoulder and clavicle pain after being thrown
from a horse today.

EXAM:
LEFT SHOULDER - 2+ VIEW

[shoulder grashey]
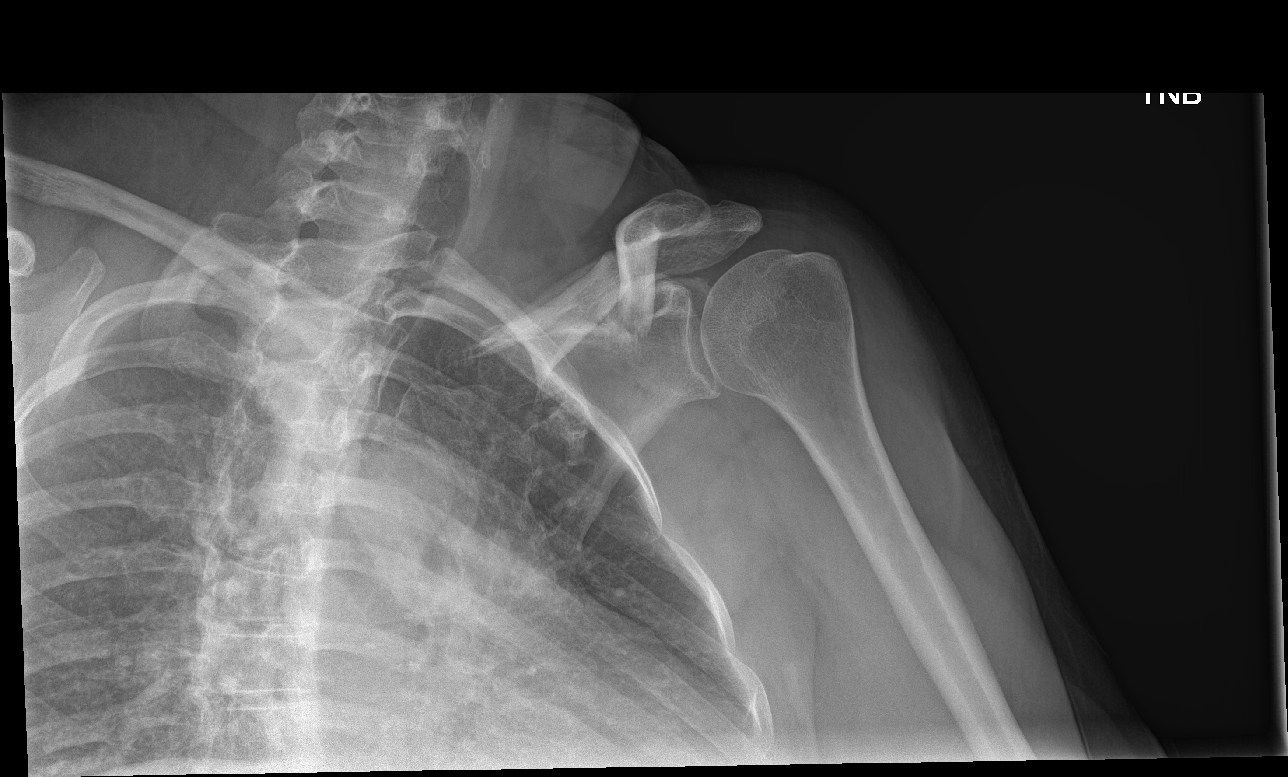

[shoulder y view]
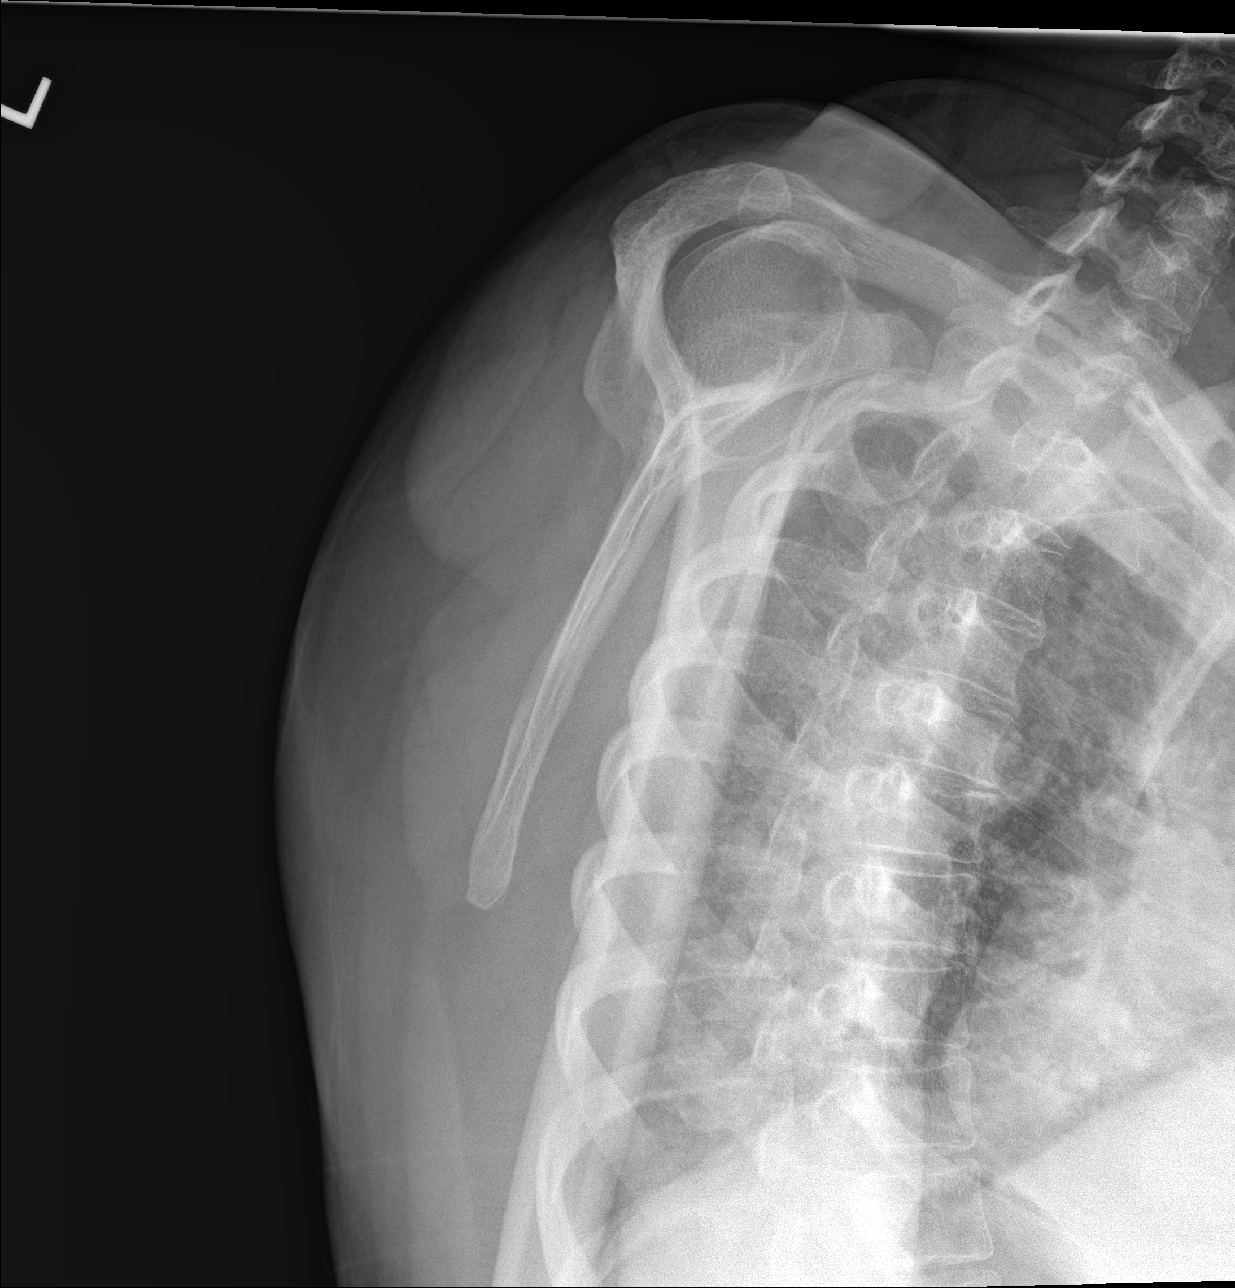

[shoulder axial]
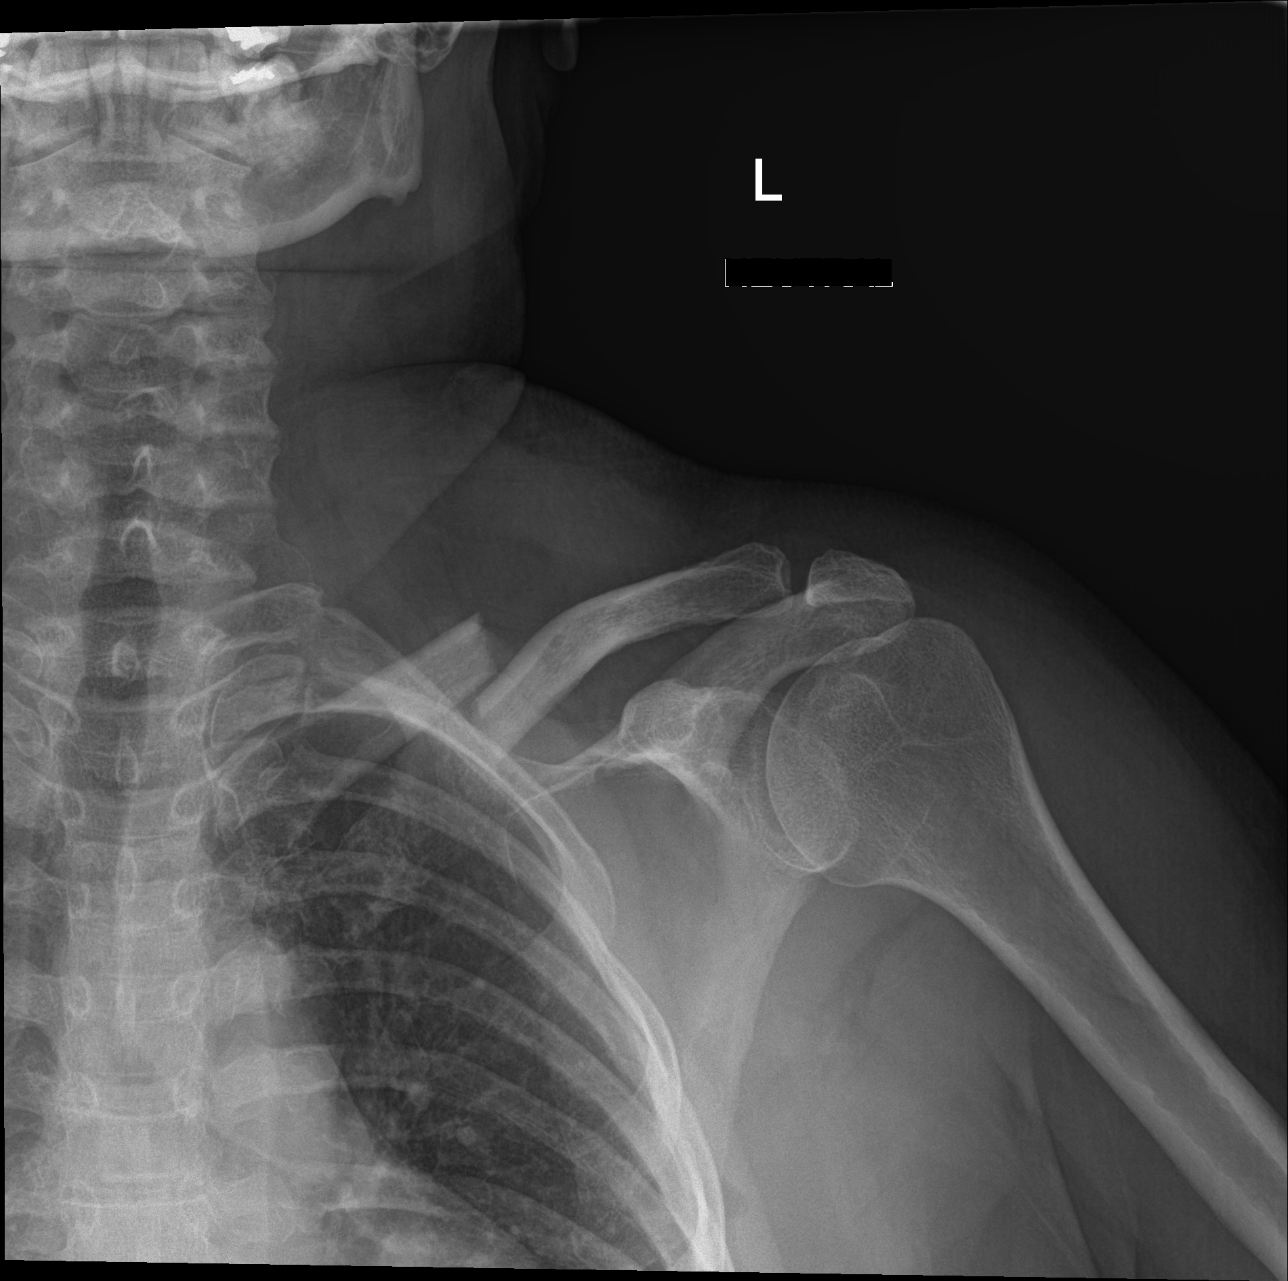

[3 of 3 positions shown; findings below may reference images not displayed]

FINDINGS: There is an inferiorly and medially displaced transverse fracture
through the mid left clavicle. There is no evidence of proximal
humeral fracture or dislocation. The left clavicle appears intact.
Mild acromioclavicular degenerative changes are present.
IMPRESSION: Displaced transverse fracture of the mid left clavicle.

## 2022-04-06 ENCOUNTER — Encounter: Payer: Self-pay | Admitting: Emergency Medicine

## 2022-04-06 ENCOUNTER — Ambulatory Visit
Admission: EM | Admit: 2022-04-06 | Discharge: 2022-04-06 | Disposition: A | Discharge status: Home / Self Care | Payer: Medicare Other | Attending: Physician Assistant | Admitting: Physician Assistant

## 2022-04-06 DIAGNOSIS — Z1152 Encounter for screening for COVID-19: Secondary | ICD-10-CM | POA: Diagnosis not present

## 2022-04-06 DIAGNOSIS — R519 Headache, unspecified: Secondary | ICD-10-CM | POA: Diagnosis not present

## 2022-04-06 DIAGNOSIS — J028 Acute pharyngitis due to other specified organisms: Secondary | ICD-10-CM | POA: Insufficient documentation

## 2022-04-06 DIAGNOSIS — B9789 Other viral agents as the cause of diseases classified elsewhere: Secondary | ICD-10-CM | POA: Insufficient documentation

## 2022-04-06 DIAGNOSIS — E119 Type 2 diabetes mellitus without complications: Secondary | ICD-10-CM | POA: Diagnosis not present

## 2022-04-06 DIAGNOSIS — J45909 Unspecified asthma, uncomplicated: Secondary | ICD-10-CM | POA: Diagnosis not present

## 2022-04-06 DIAGNOSIS — I10 Essential (primary) hypertension: Secondary | ICD-10-CM | POA: Diagnosis not present

## 2022-04-06 DIAGNOSIS — Z794 Long term (current) use of insulin: Secondary | ICD-10-CM | POA: Diagnosis not present

## 2022-04-06 DIAGNOSIS — J029 Acute pharyngitis, unspecified: Secondary | ICD-10-CM

## 2022-04-06 DIAGNOSIS — Z7984 Long term (current) use of oral hypoglycemic drugs: Secondary | ICD-10-CM | POA: Diagnosis not present

## 2022-04-06 DIAGNOSIS — R051 Acute cough: Secondary | ICD-10-CM | POA: Diagnosis present

## 2022-04-06 LAB — RESP PANEL BY RT-PCR (FLU A&B, COVID) ARPGX2
Influenza A by PCR: NEGATIVE
Influenza B by PCR: NEGATIVE
SARS Coronavirus 2 by RT PCR: NEGATIVE

## 2022-04-06 LAB — GROUP A STREP BY PCR: Group A Strep by PCR: NOT DETECTED

## 2022-04-06 MED ORDER — DEXAMETHASONE SODIUM PHOSPHATE 10 MG/ML IJ SOLN
10.0000 mg | Freq: Once | INTRAMUSCULAR | Status: AC
  Administered 2022-04-06: 10 mg via INTRAMUSCULAR

## 2022-04-06 MED ORDER — AZITHROMYCIN 250 MG PO TABS: ORAL_TABLET | ORAL | 0 refills | Status: DC

## 2022-04-06 MED ORDER — PROMETHAZINE-DM 6.25-15 MG/5ML PO SYRP: 5.0000 mL | ORAL_SOLUTION | Freq: Four times a day (QID) | ORAL | 0 refills | Status: DC | PRN

## 2022-04-06 NOTE — Discharge Instructions (Signed)
You were seen today for sore throat and cough.  You tested negative for strep, flu and COVID.  Given the swelling in your throat, we gave you a steroid injection.  This may increase her sugars over the next few days.  Although your symptoms may be viral, I am treating you with azithromycin for the next 5 days.  I have also given you cough syrup to take.  I would like for you to take ibuprofen 600 mg every 8 hours.  Salt water gargles may help the swelling in your throat.  Please follow-up with your PCP if symptoms persist or worsen.

## 2022-04-06 NOTE — ED Provider Notes (Signed)
[facility]    CSN: [id number] Arrival date & time: 04/06/22  0801      History   Chief Complaint Chief Complaint  Patient presents with   Sore Throat   Cough   Generalized Body Aches    HPI Terri Woods is a 51 y.o. female with a history of HTN, DM 2, asthma that presents to UC with c/o headache, runny nose, sore throat, cough, shortness of breath and body aches. She reports this started yesterday.  The headache is located in the top of her head.  She describes the pain as pressure.  She is blowing clear mucus out of her nose.  She is having difficulty swallowing.  The cough is productive of blood-tinged yellow mucus.  She reports fever up to 100 and body aches but denies chills.  She has tried NyQuil OTC with minimal relief of symptoms.  She has had sick contacts with similar symptoms but has not taken a home COVID test.  She had Covid 10/19, treated with Paxlovid.  She does feel like her COVID symptoms completely resolved and this is something new.  HPI  Past Medical History:  Diagnosis Date   Arthritis    neck, hips, fingers   Asthma    Back pain    Breast cancer ([place]) 2001   RT LUMPECTOMY PER PT   Carpal tunnel syndrome    right arm   Crohn's disease ([place])    Diabetes ([place])    Fibromyalgia    GERD (gastroesophageal reflux disease)    Glaucoma    Headache    Hypertension     There are no problems to display for this patient.   Past Surgical History:  Procedure Laterality Date   BILATERAL CARPAL TUNNEL RELEASE Bilateral 02/05/2019   Procedure: BILATERAL CARPAL TUNNEL RELEASE;  Surgeon: Hessie Knows, MD;  Location: [facility];  Service: Orthopedics;  Laterality: Bilateral;   BREAST EXCISIONAL BIOPSY Right 15+ yrs ago   NEG   BREAST SURGERY     CATARACT EXTRACTION W/PHACO Right 10/30/2021   Procedure: CATARACT EXTRACTION PHACO AND INTRAOCULAR LENS PLACEMENT (IOC) RIGHT DIABETIC 1.85 00:20.9;  Surgeon: Eulogio Bear, MD;  Location: [place];  Service: Ophthalmology;  Laterality: Right;  Diabetic   COLONOSCOPY WITH PROPOFOL N/A 03/10/2020   Procedure: COLONOSCOPY WITH PROPOFOL;  Surgeon: Benjamine Sprague, DO;  Location: [facility];  Service: General;  Laterality: N/A;   FOOT SURGERY Left    LYMPH NODE BIOPSY     NASAL SEPTOPLASTY W/ TURBINOPLASTY Bilateral 12/25/2018   Procedure: NASAL SEPTOPLASTY WITH INFERIOR TURBINATE REDUCTION;  Surgeon: Margaretha Sheffield, MD;  Location: [place];  Service: ENT;  Laterality: Bilateral;   SHOULDER SURGERY     SMALL INTESTINE SURGERY      OB History   No obstetric history on file.      Home Medications    Prior to Admission medications   Medication Sig Start Date End Date Taking? Authorizing Provider  azithromycin (ZITHROMAX) 250 MG tablet Take 2 tabs today, then 1 tab daily x 4 days 04/06/22  Yes Jearld Fenton, NP  promethazine-dextromethorphan (PROMETHAZINE-DM) 6.25-15 MG/5ML syrup Take 5 mLs by mouth 4 (four) times daily as needed for cough. 04/06/22  Yes Mckinley Olheiser, Coralie Keens, NP  albuterol (VENTOLIN HFA) 108 (90 Base) MCG/ACT inhaler Inhale 2 puffs into the lungs every 6 (six) hours as needed for wheezing or shortness of breath. Patient taking differently: Inhale 2 puffs into the lungs every 6 (six) hours  as needed for wheezing or shortness of breath. Last used: 6 am. 05/26/21   Hughie Closs, PA-C  atorvastatin (LIPITOR) 20 MG tablet Take 40 mg by mouth daily at 6 PM.    [provider]  azelastine (ASTELIN) 0.1 % nasal spray Place into both nostrils 2 (two) times daily as needed for rhinitis. Use in each nostril as directed    [provider]  benzonatate (TESSALON) 100 MG capsule Take 1 capsule (100 mg total) by mouth every 8 (eight) hours. 02/20/22   Brimage, Vondra, DO  bimatoprost (LUMIGAN) 0.01 % SOLN Place 1 drop into both eyes at bedtime.    [provider]  brimonidine (ALPHAGAN) 0.2 % ophthalmic solution Place into both eyes in the  morning and at bedtime.    [provider]  clonazePAM (KLONOPIN) 1 MG tablet Take 1 mg by mouth 3 (three) times daily as needed.    [provider]  cyclobenzaprine (FLEXERIL) 10 MG tablet Take 10 mg by mouth 3 (three) times daily as needed for muscle spasms.    [provider]  docusate sodium (COLACE) 100 MG capsule Take 100 mg by mouth 2 (two) times daily as needed for mild constipation.    [provider]  DULoxetine (CYMBALTA) 30 MG capsule Take 30 mg by mouth at bedtime. Take with 60 mg for total of 90 mg    [provider]  DULoxetine (CYMBALTA) 60 MG capsule Take 60 mg by mouth at bedtime. Take with 30 mg for total of 90 mg.    [provider]  EPINEPHrine 0.3 mg/0.3 mL IJ SOAJ injection Inject 0.3 mg into the muscle as needed for anaphylaxis.     [provider]  ergocalciferol (VITAMIN D2) 1.25 MG (50000 UT) capsule Take 50,000 Units by mouth See admin instructions. Take 1 capsule (50000 units) by mouth once a week or once every other week    [provider]  gabapentin (NEURONTIN) 300 MG capsule Take 1 capsule (300 mg total) by mouth 3 (three) times daily. 03/13/21   Margarette Canada, NP  glipiZIDE (GLUCOTROL) 5 MG tablet Take 10 mg by mouth 2 (two) times daily.    [provider]  ibuprofen (ADVIL) 800 MG tablet Take 800 mg by mouth every 8 (eight) hours as needed (pain.).    [provider]  insulin glargine (LANTUS) 100 UNIT/ML injection Inject 40 Units into the skin at bedtime.    [provider]  ipratropium (ATROVENT) 0.06 % nasal spray Place 2 sprays into both nostrils 4 (four) times daily. 06/01/21   Margarette Canada, NP  ketoconazole (NIZORAL) 2 % shampoo Apply 1 application topically 2 (two) times a week.    [provider]  ketorolac (TORADOL) 10 MG tablet Take 1 tablet (10 mg total) by mouth every 6 (six) hours as needed. 05/27/20   Margarette Canada, NP  lidocaine (LIDODERM) 5 % Place  2 patches onto the skin daily. Remove & Discard patch within 12 hours or as directed by MD    [provider]  lidocaine (XYLOCAINE) 5 % ointment Apply 1 application topically as needed (pain.).     [provider]  lisinopril (ZESTRIL) 2.5 MG tablet Take 5 mg by mouth every evening.    [provider]  miconazole (MICOTIN) 2 % cream Apply 1 application topically as needed (skin irritation/rash).    [provider]  naloxone [facility]) nasal spray 4 mg/0.1 mL Place 1 spray into the nose as needed (  accidental overdose).    [provider]  omeprazole (PRILOSEC) 20 MG capsule Take 20 mg by mouth at bedtime.    [provider]  ondansetron (ZOFRAN ODT) 8 MG disintegrating tablet Take 1 tablet (8 mg total) by mouth every 8 (eight) hours as needed for nausea or vomiting. 05/27/20   Margarette Canada, NP  Oxycodone HCl 10 MG TABS Take 10 mg by mouth 3 (three) times daily.    [provider]  oxyCODONE-acetaminophen (PERCOCET/ROXICET) 5-325 MG tablet Take 1 tablet by mouth every 4 (four) hours as needed (pain.).     [provider]  QUEtiapine (SEROQUEL) 300 MG tablet Take 300 mg by mouth at bedtime.    [provider]  SUMAtriptan (IMITREX) 100 MG tablet Take 100 mg by mouth every 2 (two) hours as needed for migraine. May repeat in 2 hours if headache persists or recurs.    [provider]  tiZANidine (ZANAFLEX) 4 MG tablet Take 1 tablet (4 mg total) by mouth every 8 (eight) hours as needed for muscle spasms. Patient not taking: Reported on 10/25/2021 03/02/20   Coral Spikes, DO  triamcinolone (NASACORT) 55 MCG/ACT AERO nasal inhaler Place 2 sprays into the nose daily.    [provider]  triamcinolone cream (KENALOG) 0.1 % Apply 1 application topically 2 (two) times daily. 08/31/19   Norval Gable, MD  valACYclovir (VALTREX) 1000 MG tablet Take 1 tablet (1,000 mg total) by mouth 3 (three) times daily. 03/13/21    Margarette Canada, NP  VICTOZA 18 MG/3ML SOPN SMARTSIG:0.6 Milligram(s) SUB-Q Daily 02/17/21   [provider]  topiramate (TOPAMAX) 100 MG tablet Take 100 mg by mouth 2 (two) times daily.  01/06/20  [provider]    Family History Family History  Problem Relation Age of Onset   Heart attack Sister 28   Heart disease Father    Breast cancer Mother    Breast cancer Maternal Aunt     Social History Social History   Tobacco Use   Smoking status: Never   Smokeless tobacco: Never  Vaping Use   Vaping Use: Never used  Substance Use Topics   Alcohol use: No    Alcohol/week: 0.0 standard drinks of alcohol   Drug use: Yes    Types: Oxycodone     Allergies   Levodopa, Lyrica [pregabalin], Other, Rotigotine, Erythromycin, Bee venom, Fentanyl, Hysingla er [hydrocodone bitartrate er], Meloxicam, Doxycycline, Morphine, Penicillins, and Shellfish allergy   Review of Systems Review of Systems   Constitutional: Patient reports headache and fever.  Denies malaise, fatigue,  or abrupt weight changes.  HEENT: Pt reports runny nose sore throat. Denies eye pain, eye redness, ear pain, ringing in the ears, wax buildup, nasal congestion, bloody nose. Respiratory: Pt reports cough and shortness of breath. Denies difficulty breathing, shortness of breath.   Cardiovascular: Denies chest pain, chest tightness, palpitations or swelling in the hands or feet.  Gastrointestinal: Denies abdominal pain, bloating, constipation, diarrhea or blood in the stool.  Musculoskeletal: Pt reports body aches. Denies decrease in range of motion, difficulty with gait, muscle pain or joint pain and swelling.  Skin: Denies redness, rashes, lesions or ulcercations.  Neurological: Denies dizziness, difficulty with memory, difficulty with speech or problems with balance and coordination.    No other specific complaints in a complete review of systems (except as listed in HPI above).  Physical Exam Triage  Vital Signs ED Triage Vitals  Enc Vitals Group     BP 04/06/22 0809 Marland Kitchen)  165/92     Pulse Rate 04/06/22 0809 93     Resp 04/06/22 0809 15     Temp 04/06/22 0809 98.2 F (36.8 C)     Temp Source 04/06/22 0809 Oral     SpO2 04/06/22 0809 95 %     Weight 04/06/22 0806 165 lb (74.8 kg)     Height 04/06/22 0806 '5\' 1"'$  (1.549 m)     Head Circumference --      Peak Flow --      Pain Score 04/06/22 0806 9     Pain Loc --      Pain Edu? --      Excl. in [place]? --    No data found.  Updated Vital Signs BP (!) 165/92 (BP Location: Right Arm)   Pulse 93   Temp 98.2 F (36.8 C) (Oral)   Resp 15   Ht '5\' 1"'$  (1.549 m)   Wt 165 lb (74.8 kg)   SpO2 95%   BMI 31.18 kg/m      Physical Exam  BP (!) 165/92 (BP Location: Right Arm)   Pulse 93   Temp 98.2 F (36.8 C) (Oral)   Resp 15   Ht '5\' 1"'$  (1.549 m)   Wt 165 lb (74.8 kg)   SpO2 95%   BMI 31.18 kg/m  Wt Readings from Last 3 Encounters:  04/06/22 165 lb (74.8 kg)  10/30/21 165 lb 4.8 oz (75 kg)  06/01/21 160 lb 0.9 oz (72.6 kg)    General: Appears her stated age, appears unwell but in NAD. Skin: Warm, dry and intact. No rashes noted. HEENT: Head: normal shape and size; Eyes: sclera white, no icterus, conjunctiva pink, PERRLA and EOMs intact; Throat/Mouth: Teeth present, mucosa erythematous and moist, uvula swollen, no exudate, lesions or ulcerations noted.  Neck: Bilateral anterior cervical adenopathy noted. Cardiovascular: Normal rate and rhythm. S1,S2 noted.  No murmur, rubs or gallops noted.  Pulmonary/Chest: Normal effort and positive vesicular breath sounds. No respiratory distress. No wheezes, rales or ronchi noted.  Musculoskeletal: No difficulty with gait.  Neurological: Alert and oriented.    UC Treatments / Results  Labs  Labs Reviewed  GROUP A STREP BY PCR  RESP PANEL BY RT-PCR (FLU A&B, COVID) [id number]     Medications Ordered in UC Meds ordered this encounter  Medications   dexamethasone (DECADRON) injection  10 mg   azithromycin (ZITHROMAX) 250 MG tablet    Sig: Take 2 tabs today, then 1 tab daily x 4 days    Dispense:  6 tablet    Refill:  0    Order Specific Question:   Supervising Provider    Answer:   Chase Picket [[id number]]   promethazine-dextromethorphan (PROMETHAZINE-DM) 6.25-15 MG/5ML syrup    Sig: Take 5 mLs by mouth 4 (four) times daily as needed for cough.    Dispense:  118 mL    Refill:  0    Order Specific Question:   Supervising Provider    Answer:   Chase Picket [id number]     Initial Impression / Assessment and Plan / UC Course  I have reviewed the triage vital signs and the nursing notes.  Pertinent labs & imaging results that were available during my care of the patient were reviewed by me and considered in my medical decision making (see chart for details).    51 year old female with history of HTN, DM 2 and asthma presents to the urgent care with headache, runny nose, sore throat,  cough, shortness of breath, fever and body aches.  Exam reveals cervical adenopathy, tonsillar and uvula swelling with erythema.  Flu/COVID test negative.  Strep test negative.  She was given Decadron 10 mg IM x1 for the swelling.  Advised her to monitor her sugars as they are likely to be elevated over the next few days.  Although she had negative flu, COVID and strep test here in UC and symptoms are possibly viral, given the swelling and redness in her throat, I will treat her with Azithromycin 250 mg p.o. x5 days, Promethazine DM cough syrup.  Also recommend she take Ibuprofen 600 mg every 8 hours for the next 24 hours.  She can gargle with salt water for comfort.  We will have her follow-up with her PCP if symptoms persist or worsen.  Final Clinical Impressions(s) / UC Diagnoses   Final diagnoses:  Viral pharyngitis  Acute cough     Discharge Instructions      You were seen today for sore throat and cough.  You tested negative for strep, flu and COVID.  Given the swelling in  your throat, we gave you a steroid injection.  This may increase her sugars over the next few days.  Although your symptoms may be viral, I am treating you with azithromycin for the next 5 days.  I have also given you cough syrup to take.  I would like for you to take ibuprofen 600 mg every 8 hours.  Salt water gargles may help the swelling in your throat.  Please follow-up with your PCP if symptoms persist or worsen.     ED Prescriptions     Medication Sig Dispense Auth. Provider   azithromycin (ZITHROMAX) 250 MG tablet Take 2 tabs today, then 1 tab daily x 4 days 6 tablet Jearld Fenton, NP   promethazine-dextromethorphan (PROMETHAZINE-DM) 6.25-15 MG/5ML syrup Take 5 mLs by mouth 4 (four) times daily as needed for cough. 118 mL Jearld Fenton, NP      PDMP not reviewed this encounter.   Jearld Fenton, NP 04/06/22 [phone]

## 2022-04-06 NOTE — ED Triage Notes (Signed)
Patient c/o cough, congestion, sore throat, bodyaches and fever that started yesterday.

## 2022-05-08 ENCOUNTER — Ambulatory Visit
Admission: EM | Admit: 2022-05-08 | Discharge: 2022-05-08 | Disposition: A | Discharge status: Home / Self Care | Payer: Medicare Other | Attending: Emergency Medicine | Admitting: Emergency Medicine

## 2022-05-08 ENCOUNTER — Encounter: Payer: Self-pay | Admitting: Emergency Medicine

## 2022-05-08 ENCOUNTER — Ambulatory Visit (INDEPENDENT_AMBULATORY_CARE_PROVIDER_SITE_OTHER): Payer: Medicare Other

## 2022-05-08 DIAGNOSIS — K56609 Unspecified intestinal obstruction, unspecified as to partial versus complete obstruction: Secondary | ICD-10-CM | POA: Diagnosis present

## 2022-05-08 DIAGNOSIS — R079 Chest pain, unspecified: Secondary | ICD-10-CM | POA: Diagnosis not present

## 2022-05-08 LAB — COMPREHENSIVE METABOLIC PANEL
ALT: 62 U/L — ABNORMAL HIGH (ref 0–44)
AST: 53 U/L — ABNORMAL HIGH (ref 15–41)
Albumin: 3.8 g/dL (ref 3.5–5.0)
Alkaline Phosphatase: 85 U/L (ref 38–126)
Anion gap: 7 (ref 5–15)
BUN: 20 mg/dL (ref 6–20)
CO2: 21 mmol/L — ABNORMAL LOW (ref 22–32)
Calcium: 7.4 mg/dL — ABNORMAL LOW (ref 8.9–10.3)
Chloride: 106 mmol/L (ref 98–111)
Creatinine, Ser: 1.01 mg/dL — ABNORMAL HIGH (ref 0.44–1.00)
GFR, Estimated: >60 mL/min (ref >60)
Glucose, Bld: 190 mg/dL — ABNORMAL HIGH (ref 70–99)
Potassium: 4 mmol/L (ref 3.5–5.1)
Sodium: 134 mmol/L — ABNORMAL LOW (ref 135–145)
Total Bilirubin: 0.8 mg/dL (ref 0.3–1.2)
Total Protein: 7.3 g/dL (ref 6.5–8.1)

## 2022-05-08 LAB — CBC WITH DIFFERENTIAL/PLATELET
Abs Immature Granulocytes: 0.04 10*3/uL (ref 0.00–0.07)
Basophils Absolute: 0 10*3/uL (ref 0.0–0.1)
Basophils Relative: 0 %
Eosinophils Absolute: 0.2 10*3/uL (ref 0.0–0.5)
Eosinophils Relative: 2 %
HCT: 35.7 % — ABNORMAL LOW (ref 36.0–46.0)
Hemoglobin: 11.7 g/dL — ABNORMAL LOW (ref 12.0–15.0)
Immature Granulocytes: 1 %
Lymphocytes Relative: 19 %
Lymphs Abs: 1.3 10*3/uL (ref 0.7–4.0)
MCH: 28.1 pg (ref 26.0–34.0)
MCHC: 32.8 g/dL (ref 30.0–36.0)
MCV: 85.6 fL (ref 80.0–100.0)
Monocytes Absolute: 0.4 10*3/uL (ref 0.1–1.0)
Monocytes Relative: 6 %
Neutro Abs: 4.9 10*3/uL (ref 1.7–7.7)
Neutrophils Relative %: 72 %
Platelets: 205 10*3/uL (ref 150–400)
RBC: 4.17 MIL/uL (ref 3.87–5.11)
RDW: 15 % (ref 11.5–15.5)
WBC: 6.9 10*3/uL (ref 4.0–10.5)
nRBC: 0 % (ref 0.0–0.2)

## 2022-05-08 NOTE — Discharge Instructions (Addendum)
Please do not eat or drink anything before being evaluated in the emergency department.  Please go to the emergency department at [facility] for evaluation of your severely low calcium and your bowel obstruction.  Please go now.

## 2022-05-08 NOTE — ED Provider Notes (Signed)
[facility]    CSN: [id number] Arrival date & time: 05/08/22  0805      History   Chief Complaint Chief Complaint  Patient presents with   Emesis   Abdominal Pain   Diarrhea   Fever    HPI Terri Woods is a 51 y.o. female.   HPI  51 year old female here for evaluation of GI complaints.  Patient reports that for the last week she has been experiencing fever with a Tmax of 101, generalized abdominal pain, nausea, vomiting, and diarrhea.  She is now experiencing some chest pain, lightheadedness, and feels short of breath.  She has a significant past medical history to include hypertension, Crohn's disease, diabetes, asthma, breast cancer, glaucoma, and GERD.  She is 6 weeks postop from adnexal cyst removal on 03/19/2022.  Patient reports that she is still having abdominal pain but she has not had a fever for the last 3 days.  She has not had any vomiting in 2 days though she still has occasional dry heaves.  She has been taking nausea medicine which has been helping.  She is able to keep down fluids and has kept down some solids.  Her last episode of diarrhea was yesterday.  She denies any blood in either her stool or vomit.  She has not had any fainting or syncope.  Past Medical History:  Diagnosis Date   Arthritis    neck, hips, fingers   Asthma    Back pain    Breast cancer ([place]) 2001   RT LUMPECTOMY PER PT   Carpal tunnel syndrome    right arm   Crohn's disease ([place])    Diabetes ([place])    Fibromyalgia    GERD (gastroesophageal reflux disease)    Glaucoma    Headache    Hypertension     There are no problems to display for this patient.   Past Surgical History:  Procedure Laterality Date   BILATERAL CARPAL TUNNEL RELEASE Bilateral 02/05/2019   Procedure: BILATERAL CARPAL TUNNEL RELEASE;  Surgeon: Hessie Knows, MD;  Location: [facility];  Service: Orthopedics;  Laterality: Bilateral;   BREAST EXCISIONAL BIOPSY Right 15+ yrs ago   NEG   BREAST SURGERY      CATARACT EXTRACTION W/PHACO Right 10/30/2021   Procedure: CATARACT EXTRACTION PHACO AND INTRAOCULAR LENS PLACEMENT (IOC) RIGHT DIABETIC 1.85 00:20.9;  Surgeon: Eulogio Bear, MD;  Location: [place];  Service: Ophthalmology;  Laterality: Right;  Diabetic   COLONOSCOPY WITH PROPOFOL N/A 03/10/2020   Procedure: COLONOSCOPY WITH PROPOFOL;  Surgeon: Benjamine Sprague, DO;  Location: [facility];  Service: General;  Laterality: N/A;   FOOT SURGERY Left    LYMPH NODE BIOPSY     NASAL SEPTOPLASTY W/ TURBINOPLASTY Bilateral 12/25/2018   Procedure: NASAL SEPTOPLASTY WITH INFERIOR TURBINATE REDUCTION;  Surgeon: Margaretha Sheffield, MD;  Location: [place];  Service: ENT;  Laterality: Bilateral;   SHOULDER SURGERY     SMALL INTESTINE SURGERY      OB History   No obstetric history on file.      Home Medications    Prior to Admission medications   Medication Sig Start Date End Date Taking? Authorizing Provider  albuterol (VENTOLIN HFA) 108 (90 Base) MCG/ACT inhaler Inhale 2 puffs into the lungs every 6 (six) hours as needed for wheezing or shortness of breath. Patient taking differently: Inhale 2 puffs into the lungs every 6 (six) hours as needed for wheezing or shortness of breath. Last used: 6 am. 05/26/21  Nelwyn Salisbury M, PA-C  atorvastatin (LIPITOR) 20 MG tablet Take 40 mg by mouth daily at 6 PM.    [provider]  azelastine (ASTELIN) 0.1 % nasal spray Place into both nostrils 2 (two) times daily as needed for rhinitis. Use in each nostril as directed    [provider]  azithromycin (ZITHROMAX) 250 MG tablet Take 2 tabs today, then 1 tab daily x 4 days 04/06/22   Jearld Fenton, NP  benzonatate (TESSALON) 100 MG capsule Take 1 capsule (100 mg total) by mouth every 8 (eight) hours. 02/20/22   Brimage, Vondra, DO  bimatoprost (LUMIGAN) 0.01 % SOLN Place 1 drop into both eyes at bedtime.    [provider]  brimonidine (ALPHAGAN) 0.2 %  ophthalmic solution Place into both eyes in the morning and at bedtime.    [provider]  clonazePAM (KLONOPIN) 1 MG tablet Take 1 mg by mouth 3 (three) times daily as needed.    [provider]  cyclobenzaprine (FLEXERIL) 10 MG tablet Take 10 mg by mouth 3 (three) times daily as needed for muscle spasms.    [provider]  docusate sodium (COLACE) 100 MG capsule Take 100 mg by mouth 2 (two) times daily as needed for mild constipation.    [provider]  DULoxetine (CYMBALTA) 30 MG capsule Take 30 mg by mouth at bedtime. Take with 60 mg for total of 90 mg    [provider]  DULoxetine (CYMBALTA) 60 MG capsule Take 60 mg by mouth at bedtime. Take with 30 mg for total of 90 mg.    [provider]  EPINEPHrine 0.3 mg/0.3 mL IJ SOAJ injection Inject 0.3 mg into the muscle as needed for anaphylaxis.     [provider]  ergocalciferol (VITAMIN D2) 1.25 MG (50000 UT) capsule Take 50,000 Units by mouth See admin instructions. Take 1 capsule (50000 units) by mouth once a week or once every other week    [provider]  gabapentin (NEURONTIN) 300 MG capsule Take 1 capsule (300 mg total) by mouth 3 (three) times daily. 03/13/21   Margarette Canada, NP  glipiZIDE (GLUCOTROL) 5 MG tablet Take 10 mg by mouth 2 (two) times daily.    [provider]  ibuprofen (ADVIL) 800 MG tablet Take 800 mg by mouth every 8 (eight) hours as needed (pain.).    [provider]  insulin glargine (LANTUS) 100 UNIT/ML injection Inject 40 Units into the skin at bedtime.    [provider]  ipratropium (ATROVENT) 0.06 % nasal spray Place 2 sprays into both nostrils 4 (four) times daily. 06/01/21   Margarette Canada, NP  ketoconazole (NIZORAL) 2 % shampoo Apply 1 application topically 2 (two) times a week.    [provider]  ketorolac (TORADOL) 10 MG tablet Take 1 tablet (10 mg total) by mouth every 6 (six) hours as needed. 05/27/20    Margarette Canada, NP  lidocaine (LIDODERM) 5 % Place 2 patches onto the skin daily. Remove & Discard patch within 12 hours or as directed by MD    [provider]  lidocaine (XYLOCAINE) 5 % ointment Apply 1 application topically as needed (pain.).     [provider]  lisinopril (ZESTRIL) 2.5 MG tablet Take 5 mg by mouth every evening.    [provider]  miconazole (MICOTIN) 2 % cream Apply 1 application topically as needed (skin irritation/rash).    [provider]  naloxone [facility]) nasal spray 4 mg/0.1  mL Place 1 spray into the nose as needed (accidental overdose).    [provider]  omeprazole (PRILOSEC) 20 MG capsule Take 20 mg by mouth at bedtime.    [provider]  ondansetron (ZOFRAN ODT) 8 MG disintegrating tablet Take 1 tablet (8 mg total) by mouth every 8 (eight) hours as needed for nausea or vomiting. 05/27/20   Margarette Canada, NP  Oxycodone HCl 10 MG TABS Take 10 mg by mouth 3 (three) times daily.    [provider]  oxyCODONE-acetaminophen (PERCOCET/ROXICET) 5-325 MG tablet Take 1 tablet by mouth every 4 (four) hours as needed (pain.).     [provider]  promethazine-dextromethorphan (PROMETHAZINE-DM) 6.25-15 MG/5ML syrup Take 5 mLs by mouth 4 (four) times daily as needed for cough. 04/06/22   Jearld Fenton, NP  QUEtiapine (SEROQUEL) 300 MG tablet Take 300 mg by mouth at bedtime.    [provider]  SUMAtriptan (IMITREX) 100 MG tablet Take 100 mg by mouth every 2 (two) hours as needed for migraine. May repeat in 2 hours if headache persists or recurs.    [provider]  tiZANidine (ZANAFLEX) 4 MG tablet Take 1 tablet (4 mg total) by mouth every 8 (eight) hours as needed for muscle spasms. Patient not taking: Reported on 10/25/2021 03/02/20   Coral Spikes, DO  triamcinolone (NASACORT) 55 MCG/ACT AERO nasal inhaler Place 2 sprays into the nose daily.    [provider]  triamcinolone cream  (KENALOG) 0.1 % Apply 1 application topically 2 (two) times daily. 08/31/19   Norval Gable, MD  valACYclovir (VALTREX) 1000 MG tablet Take 1 tablet (1,000 mg total) by mouth 3 (three) times daily. 03/13/21   Margarette Canada, NP  VICTOZA 18 MG/3ML SOPN SMARTSIG:0.6 Milligram(s) SUB-Q Daily 02/17/21   [provider]  topiramate (TOPAMAX) 100 MG tablet Take 100 mg by mouth 2 (two) times daily.  01/06/20  [provider]    Family History Family History  Problem Relation Age of Onset   Heart attack Sister 57   Heart disease Father    Breast cancer Mother    Breast cancer Maternal Aunt     Social History Social History   Tobacco Use   Smoking status: Never   Smokeless tobacco: Never  Vaping Use   Vaping Use: Never used  Substance Use Topics   Alcohol use: No    Alcohol/week: 0.0 standard drinks of alcohol   Drug use: Yes    Types: Oxycodone     Allergies   Levodopa, Lyrica [pregabalin], Other, Rotigotine, Erythromycin, Bee venom, Fentanyl, Hysingla er [hydrocodone bitartrate er], Meloxicam, Doxycycline, Morphine, Penicillins, and Shellfish allergy   Review of Systems Review of Systems  Constitutional:  Positive for fatigue and fever.  Respiratory:  Positive for shortness of breath.   Cardiovascular:  Positive for chest pain.  Gastrointestinal:  Positive for abdominal distention, abdominal pain, diarrhea, nausea and vomiting. Negative for blood in stool.  Neurological:  Positive for light-headedness. Negative for syncope.  Hematological: Negative.   Psychiatric/Behavioral: Negative.       Physical Exam Triage Vital Signs ED Triage Vitals  Enc Vitals Group     BP      Pulse      Resp      Temp      Temp src      SpO2      Weight      Height      Head Circumference  Peak Flow      Pain Score      Pain Loc      Pain Edu?      Excl. in [place]?    No data found.  Updated Vital Signs BP 106/67 (BP Location: Left Arm)   Pulse 94   Temp 98 F  (36.7 C) (Oral)   Resp 16   SpO2 96%   Visual Acuity Right Eye Distance:   Left Eye Distance:   Bilateral Distance:    Right Eye Near:   Left Eye Near:    Bilateral Near:     Physical Exam Vitals and nursing note reviewed.  Constitutional:      Appearance: Normal appearance. She is not ill-appearing.  HENT:     Head: Normocephalic and atraumatic.     Mouth/Throat:     Mouth: Mucous membranes are moist.     Pharynx: Oropharynx is clear. No oropharyngeal exudate or posterior oropharyngeal erythema.  Cardiovascular:     Rate and Rhythm: Normal rate and regular rhythm.     Pulses: Normal pulses.     Heart sounds: Normal heart sounds. No murmur heard.    No friction rub. No gallop.  Pulmonary:     Effort: Pulmonary effort is normal.     Breath sounds: Normal breath sounds. No wheezing, rhonchi or rales.  Abdominal:     General: There is distension.     Palpations: Abdomen is soft.     Tenderness: There is abdominal tenderness. There is no guarding or rebound.     Comments: Patient has generalized abdominal tenderness without guarding or rebound.  Her abdomen is distended but soft.  Skin:    General: Skin is warm and dry.     Capillary Refill: Capillary refill takes less than 2 seconds.     Coloration: Skin is not pale.     Findings: No rash.  Neurological:     General: No focal deficit present.     Mental Status: She is alert and oriented to person, place, and time.  Psychiatric:        Mood and Affect: Mood normal.        Behavior: Behavior normal.        Thought Content: Thought content normal.        Judgment: Judgment normal.      UC Treatments / Results  Labs (all labs ordered are listed, but only abnormal results are displayed) Labs Reviewed  CBC WITH DIFFERENTIAL/PLATELET - Abnormal; Notable for the following components:      Result Value   Hemoglobin 11.7 (*)    HCT 35.7 (*)    All other components within normal limits  COMPREHENSIVE METABOLIC PANEL -  Abnormal; Notable for the following components:   Sodium 134 (*)    CO2 21 (*)    Glucose, Bld 190 (*)    Creatinine, Ser 1.01 (*)    Calcium 7.4 (*)    AST 53 (*)    ALT 62 (*)    All other components within normal limits    EKG Sinus rhythm with occasional PVCs and a ventricular rate of 89 bpm PR interval 140 ms QRS duration 80 ms QT/QTc 402/409 ms No ST or T wave abnormalities noted.  There are occasional unifocal PVCs. No change when compared to EKG from 01/28/2019.   Radiology DG Abdomen Acute W/Chest  Result Date: 05/08/2022 CLINICAL DATA:  Abdominal and chest pain EXAM: DG ABDOMEN ACUTE WITH 1 VIEW CHEST COMPARISON:  06/01/2021  FINDINGS: Gas dilated bowel loops especially affecting the colon, transverse colon distally measuring up to 8.5 cm. No evidence of pneumoperitoneum. Normal heart size and mediastinal contours. No acute infiltrate or edema. No effusion or pneumothorax. No acute osseous findings. IMPRESSION: Small and large bowel dilatation with fluid levels, ileus or distal obstruction could give this pattern. Electronically Signed   By: Jorje Guild M.D.   On: 05/08/2022 09:02    Procedures Procedures (including critical care time)  Medications Ordered in UC Medications - No data to display  Initial Impression / Assessment and Plan / UC Course  I have reviewed the triage vital signs and the nursing notes.  Pertinent labs & imaging results that were available during my care of the patient were reviewed by me and considered in my medical decision making (see chart for details).   Patient is a nontoxic-appearing 51 year old female presenting for evaluation of GI complaints as outlined HPI above.  She is not in any acute distress and she is able to speak in full sentences without any dyspnea or tachypnea.  She states she does feel short of breath and has been using her albuterol inhaler which has not been helping.  She is also complaining of some chest pain and  feeling fatigued due to having to care for her husband while also feeling ill.  She is a diabetic and states that her blood sugars have been running between 70 and 110.  She has not had a fever in the last 3 days nor has she had vomiting in the last 2 days.  Her last episode of diarrhea was yesterday.  She is able to keep down some solids and is able to keep down fluids with antiemetics.  Patient has good skin turgor and moist oral mucous membranes.  However, given her 1 week history of fever, abdominal pain, vomiting and diarrhea I will check a CBC and CMP to look for any systemic infection or electrolyte imbalance.  I will also order a three-way abdomen to look for any signs of possible obstruction given her abdominal distention and recent surgery.  I will also order an EKG to evaluate her chest pain.  EKG shows sinus rhythm with an occasional unifocal PVC but no ST or T wave abnormalities.  No significant change when compared to EKG from 01/28/2019.  Per imaging staff patient became dizzy when ambulating back from x-ray.  No syncopal events.  CBC shows a normal white count of 6.9.  H&H is mildly low at 11.7 and 35.7 but no significant anemia.  Platelets are normal at 205.  Differential is unremarkable.  CMP shows mild hyponatremia with a sodium of 134.  Potassium is normal at 4.0.  CO2 is mildly decreased at 21.  Glucose is 190.  Creatinine is mildly elevated at 101, severe hypocalcemia at 7.4, AST and ALT are mildly elevated at 53 and 62 respectively.  Radiology reported through abdomens shows small and large bowel dilation with fluid levels.  Ileus or distal obstruction could give this pattern.  Given patient's severe hypocalcemia with a calcium of 7.4 and possible bowel obstruction unguinal recommend that she go to the emergency department for further evaluation.  I discussed the findings with the patient and her husband and they would prefer to go to [facility] and travel via [place].   Final  Clinical Impressions(s) / UC Diagnoses   Final diagnoses:  Hypocalcemia  Intestinal obstruction, unspecified cause, unspecified whether partial or complete [facility])     Discharge Instructions  Please do not eat or drink anything before being evaluated in the emergency department.  Please go to the emergency department at [facility] for evaluation of your severely low calcium and your bowel obstruction.  Please go now.     ED Prescriptions   None    PDMP not reviewed this encounter.   Margarette Canada, NP 05/08/22 [phone]

## 2022-05-08 NOTE — ED Notes (Signed)
Patient is being discharged from the Urgent Care and sent to the Emergency Department via Personal Vehicle . Per Anson Crofts, NP, patient is in need of higher level of care due to decreased calcium and possible bowel obstruction. Patient is aware and verbalizes understanding of plan of care.  Vitals:   05/08/22 0818  BP: 106/67  Pulse: 94  Resp: 16  Temp: 98 F (36.7 C)  SpO2: 96%

## 2022-05-08 NOTE — ED Triage Notes (Signed)
Pt presents with diarrhea, abdominal pain, fever, vomiting x 1 week.

## 2022-09-20 ENCOUNTER — Ambulatory Visit
Admission: EM | Admit: 2022-09-20 | Discharge: 2022-09-20 | Disposition: A | Discharge status: Home / Self Care | Payer: 59 | Attending: Family Medicine | Admitting: Family Medicine

## 2022-09-20 DIAGNOSIS — N3000 Acute cystitis without hematuria: Secondary | ICD-10-CM | POA: Insufficient documentation

## 2022-09-20 LAB — URINALYSIS, W/ REFLEX TO CULTURE (INFECTION SUSPECTED)
Glucose, UA: NEGATIVE mg/dL
Hgb urine dipstick: NEGATIVE
Nitrite: NEGATIVE
Specific Gravity, Urine: >1.03 — ABNORMAL HIGH (ref 1.005–1.030)
pH: 5.5 (ref 5.0–8.0)

## 2022-09-20 LAB — WET PREP, GENITAL
Clue Cells Wet Prep HPF POC: NONE SEEN
Sperm: NONE SEEN
Trich, Wet Prep: NONE SEEN
WBC, Wet Prep HPF POC: >10 — AB (ref <10)
Yeast Wet Prep HPF POC: NONE SEEN

## 2022-09-20 MED ORDER — NITROFURANTOIN MONOHYD MACRO 100 MG PO CAPS: 100.0000 mg | ORAL_CAPSULE | Freq: Two times a day (BID) | ORAL | 0 refills | Status: DC

## 2022-09-20 NOTE — ED Provider Notes (Signed)
[facility]    CSN: [id number] Arrival date & time: 09/20/22  1707      History   Chief Complaint Chief Complaint  Patient presents with   Abdominal Pain   Vaginal Discharge   Dysuria    HPI Gracely Kubina is a 52 y.o. female.   HPI  Juliani presents for lower abdominal pain with milky vaginal discharge that started about a day ago.  He has painful urination.  Has not had any nausea, vomiting or diarrhea.  She is not concerned for any STDs.  She is not currently pregnant.  She was seen at the emergency department last month for similar issue and had a CT that did not show anything.      Past Medical History:  Diagnosis Date   Arthritis    neck, hips, fingers   Asthma    Back pain    Breast cancer (HCC) 2001   RT LUMPECTOMY PER PT   Carpal tunnel syndrome    right arm   Crohn's disease (HCC)    Diabetes (HCC)    Fibromyalgia    GERD (gastroesophageal reflux disease)    Glaucoma    Headache    Hypertension     There are no problems to display for this patient.   Past Surgical History:  Procedure Laterality Date   BILATERAL CARPAL TUNNEL RELEASE Bilateral 02/05/2019   Procedure: BILATERAL CARPAL TUNNEL RELEASE;  Surgeon: Kennedy Bucker, MD;  Location: [facility];  Service: Orthopedics;  Laterality: Bilateral;   BREAST EXCISIONAL BIOPSY Right 15+ yrs ago   NEG   BREAST SURGERY     CATARACT EXTRACTION W/PHACO Right 10/30/2021   Procedure: CATARACT EXTRACTION PHACO AND INTRAOCULAR LENS PLACEMENT (IOC) RIGHT DIABETIC 1.85 00:20.9;  Surgeon: Nevada Crane, MD;  Location: [facility];  Service: Ophthalmology;  Laterality: Right;  Diabetic   COLONOSCOPY WITH PROPOFOL N/A 03/10/2020   Procedure: COLONOSCOPY WITH PROPOFOL;  Surgeon: Sung Amabile, DO;  Location: [facility];  Service: General;  Laterality: N/A;   FOOT SURGERY Left    LYMPH NODE BIOPSY     NASAL SEPTOPLASTY W/ TURBINOPLASTY Bilateral 12/25/2018   Procedure: NASAL SEPTOPLASTY WITH  INFERIOR TURBINATE REDUCTION;  Surgeon: Vernie Murders, MD;  Location: [facility];  Service: ENT;  Laterality: Bilateral;   SHOULDER SURGERY     SMALL INTESTINE SURGERY      OB History   No obstetric history on file.      Home Medications    Prior to Admission medications   Medication Sig Start Date End Date Taking? Authorizing Provider  albuterol (VENTOLIN HFA) 108 (90 Base) MCG/ACT inhaler Inhale 2 puffs into the lungs every 6 (six) hours as needed for wheezing or shortness of breath. Patient taking differently: Inhale 2 puffs into the lungs every 6 (six) hours as needed for wheezing or shortness of breath. Last used: 6 am. 05/26/21  Yes Covington, Sarah M, PA-C  atorvastatin (LIPITOR) 20 MG tablet Take 40 mg by mouth daily at 6 PM.   Yes [provider]  azelastine (ASTELIN) 0.1 % nasal spray Place into both nostrils 2 (two) times daily as needed for rhinitis. Use in each nostril as directed   Yes [provider]  azithromycin (ZITHROMAX) 250 MG tablet Take 2 tabs today, then 1 tab daily x 4 days 04/06/22  Yes Baity, Salvadore Oxford, NP  bimatoprost (LUMIGAN) 0.01 % SOLN Place 1 drop into both eyes at bedtime.   Yes [provider]  brimonidine (  ALPHAGAN) 0.2 % ophthalmic solution Place into both eyes in the morning and at bedtime.   Yes [provider]  clonazePAM (KLONOPIN) 1 MG tablet Take 1 mg by mouth 3 (three) times daily as needed.   Yes [provider]  cyclobenzaprine (FLEXERIL) 10 MG tablet Take 10 mg by mouth 3 (three) times daily as needed for muscle spasms.   Yes [provider]  docusate sodium (COLACE) 100 MG capsule Take 100 mg by mouth 2 (two) times daily as needed for mild constipation.   Yes [provider]  DULoxetine (CYMBALTA) 30 MG capsule Take 30 mg by mouth at bedtime. Take with 60 mg for total of 90 mg   Yes [provider]  DULoxetine (CYMBALTA) 60 MG capsule Take 60 mg by mouth at bedtime.  Take with 30 mg for total of 90 mg.   Yes [provider]  EPINEPHrine 0.3 mg/0.3 mL IJ SOAJ injection Inject 0.3 mg into the muscle as needed for anaphylaxis.    Yes [provider]  ergocalciferol (VITAMIN D2) 1.25 MG (50000 UT) capsule Take 50,000 Units by mouth See admin instructions. Take 1 capsule (50000 units) by mouth once a week or once every other week   Yes [provider]  gabapentin (NEURONTIN) 300 MG capsule Take 1 capsule (300 mg total) by mouth 3 (three) times daily. 03/13/21  Yes Becky Augusta, NP  glipiZIDE (GLUCOTROL) 5 MG tablet Take 10 mg by mouth 2 (two) times daily.   Yes [provider]  ibuprofen (ADVIL) 800 MG tablet Take 800 mg by mouth every 8 (eight) hours as needed (pain.).   Yes [provider]  insulin glargine (LANTUS) 100 UNIT/ML injection Inject 40 Units into the skin at bedtime.   Yes [provider]  ipratropium (ATROVENT) 0.06 % nasal spray Place 2 sprays into both nostrils 4 (four) times daily. 06/01/21  Yes Becky Augusta, NP  ketoconazole (NIZORAL) 2 % shampoo Apply 1 application topically 2 (two) times a week.   Yes [provider]  ketorolac (TORADOL) 10 MG tablet Take 1 tablet (10 mg total) by mouth every 6 (six) hours as needed. 05/27/20  Yes Becky Augusta, NP  lidocaine (LIDODERM) 5 % Place 2 patches onto the skin daily. Remove & Discard patch within 12 hours or as directed by MD   Yes [provider]  lidocaine (XYLOCAINE) 5 % ointment Apply 1 application topically as needed (pain.).    Yes [provider]  lisinopril (ZESTRIL) 2.5 MG tablet Take 5 mg by mouth every evening.   Yes [provider]  miconazole (MICOTIN) 2 % cream Apply 1 application topically as needed (skin irritation/rash).   Yes [provider]  naloxone (NARCAN) nasal spray 4 mg/0.1 mL Place 1 spray into the nose as needed (accidental overdose).   Yes [provider]  nitrofurantoin,  macrocrystal-monohydrate, (MACROBID) 100 MG capsule Take 1 capsule (100 mg total) by mouth 2 (two) times daily. 09/20/22  Yes Marti Acebo, DO  omeprazole (PRILOSEC) 20 MG capsule Take 20 mg by mouth at bedtime.   Yes [provider]  ondansetron (ZOFRAN ODT) 8 MG disintegrating tablet Take 1 tablet (8 mg total) by mouth every 8 (eight) hours as needed for nausea or vomiting. 05/27/20  Yes Becky Augusta, NP  Oxycodone HCl 10 MG TABS Take 10 mg by mouth 3 (three) times daily.   Yes [provider]  oxyCODONE-acetaminophen (PERCOCET/ROXICET) 5-325 MG tablet Take 1 tablet by mouth every  4 (four) hours as needed (pain.).    Yes [provider]  QUEtiapine (SEROQUEL) 300 MG tablet Take 300 mg by mouth at bedtime.   Yes [provider]  SUMAtriptan (IMITREX) 100 MG tablet Take 100 mg by mouth every 2 (two) hours as needed for migraine. May repeat in 2 hours if headache persists or recurs.   Yes [provider]  triamcinolone (NASACORT) 55 MCG/ACT AERO nasal inhaler Place 2 sprays into the nose daily.   Yes [provider]  triamcinolone cream (KENALOG) 0.1 % Apply 1 application topically 2 (two) times daily. 08/31/19  Yes Payton Mccallum, MD  valACYclovir (VALTREX) 1000 MG tablet Take 1 tablet (1,000 mg total) by mouth 3 (three) times daily. 03/13/21  Yes Becky Augusta, NP  VICTOZA 18 MG/3ML SOPN SMARTSIG:0.6 Milligram(s) SUB-Q Daily 02/17/21  Yes [provider]  topiramate (TOPAMAX) 100 MG tablet Take 100 mg by mouth 2 (two) times daily.  01/06/20  [provider]    Family History Family History  Problem Relation Age of Onset   Heart attack Sister 87   Heart disease Father    Breast cancer Mother    Breast cancer Maternal Aunt     Social History Social History   Tobacco Use   Smoking status: Never   Smokeless tobacco: Never  Vaping Use   Vaping Use: Never used  Substance Use Topics   Alcohol use: No    Alcohol/week: 0.0  standard drinks of alcohol   Drug use: Yes    Types: Oxycodone     Allergies   Levodopa, Lyrica [pregabalin], Other, Rotigotine, Erythromycin, Bee venom, Fentanyl, Hysingla er [hydrocodone bitartrate er], Meloxicam, Doxycycline, Morphine, Penicillins, and Shellfish allergy   Review of Systems Review of Systems :negative unless otherwise stated in HPI.      Physical Exam Triage Vital Signs ED Triage Vitals [09/20/22 1717]  Enc Vitals Group     BP      Pulse      Resp 16     Temp      Temp Source Oral     SpO2      Weight      Height      Head Circumference      Peak Flow      Pain Score      Pain Loc      Pain Edu?      Excl. in GC?    No data found.  Updated Vital Signs BP (!) 151/79 (BP Location: Left Arm)   Pulse 88   Temp 98.4 F (36.9 C) (Oral)   Resp 16   Ht 5\' 1"  (1.549 m)   Wt 72.6 kg   SpO2 98%   BMI 30.23 kg/m   Visual Acuity Right Eye Distance:   Left Eye Distance:   Bilateral Distance:    Right Eye Near:   Left Eye Near:    Bilateral Near:     Physical Exam  GEN: pleasant well appearing female, in no acute distress  CV: regular rate and rhythm,  RESP: no increased work of breathing, clear to ascultation bilaterally ABD: Bowel sounds present. Soft, right lower and suprapubic tenderness, non-distended.  No guarding, no rebound MSK: no extremity edema, baseline ROM  SKIN: warm, dry   UC Treatments / Results  Labs (all labs ordered are listed, but only abnormal results are displayed) Labs Reviewed  WET PREP, GENITAL - Abnormal; Notable for the following components:      Result  Value   WBC, Wet Prep HPF POC >10 (*)    All other components within normal limits  URINALYSIS, W/ REFLEX TO CULTURE (INFECTION SUSPECTED) - Abnormal; Notable for the following components:   APPearance HAZY (*)    Specific Gravity, Urine >1.030 (*)    Bilirubin Urine SMALL (*)    Ketones, ur TRACE (*)    Protein, ur TRACE (*)    Leukocytes,Ua TRACE (*)     Bacteria, UA FEW (*)    All other components within normal limits    EKG  If EKG performed, see my interpretation and MDM section  Radiology No results found.   Procedures Procedures (including critical care time)  Medications Ordered in UC Medications - No data to display  Initial Impression / Assessment and Plan / UC Course  I have reviewed the triage vital signs and the nursing notes.  Pertinent labs & imaging results that were available during my care of the patient were reviewed by me and considered in my medical decision making (see chart for details).       Patient is a 52 y.o. female  who presents for lower abdominal pain with vaginal discharge and dysuria. Overall patient is well-appearing and afebrile.  Vital signs stable.  UA consistent with acute cystitis. Hematuria not supported on microscopy. Treat with Macrobid as pt declines bactrim and keflex. Wet prep not concerning for yeast or bacterial vaginitis.  Return precautions including abdominal pain, fever, chills, nausea, or vomiting given.   Discussed MDM, treatment plan and plan for follow-up with patient who agrees with plan.     Final Clinical Impressions(s) / UC Diagnoses   Final diagnoses:  Acute cystitis without hematuria     Discharge Instructions      You have evidence of a urinary tract infection. Stop by the pharmacy to pick up your prescriptions.  Follow up with your primary care provider as needed.      ED Prescriptions     Medication Sig Dispense Auth. Provider   nitrofurantoin, macrocrystal-monohydrate, (MACROBID) 100 MG capsule Take 1 capsule (100 mg total) by mouth 2 (two) times daily. 10 capsule Katha Cabal, DO      PDMP not reviewed this encounter.   Katha Cabal, DO 09/20/22 1800

## 2022-09-20 NOTE — Discharge Instructions (Signed)
You have evidence of a urinary tract infection. Stop by the pharmacy to pick up your prescriptions.  Follow up with your primary care provider as needed.

## 2022-09-20 NOTE — ED Triage Notes (Signed)
Pt c/o RLQ abd pain,pain during voiding & some vaginal discharge x1 day, states it has been progressively worse throughout the day but even worse during/after urination. Denies any N/V/D. Is able to eat & keep fluids down. Was seen at [facility] ED on 4/21 for the same issue & had CT of abd which was unremarkable.

## 2022-11-09 ENCOUNTER — Other Ambulatory Visit: Payer: Self-pay | Admitting: Orthopedic Surgery

## 2022-11-09 DIAGNOSIS — G8929 Other chronic pain: Secondary | ICD-10-CM

## 2022-11-09 DIAGNOSIS — M4807 Spinal stenosis, lumbosacral region: Secondary | ICD-10-CM

## 2022-11-14 ENCOUNTER — Ambulatory Visit
Admission: RE | Admit: 2022-11-14 | Discharge: 2022-11-14 | Disposition: A | Discharge status: Home / Self Care | Payer: 59 | Source: Ambulatory Visit | Attending: Orthopedic Surgery | Admitting: Orthopedic Surgery

## 2022-11-14 DIAGNOSIS — M4807 Spinal stenosis, lumbosacral region: Secondary | ICD-10-CM | POA: Diagnosis present

## 2022-11-14 DIAGNOSIS — M5442 Lumbago with sciatica, left side: Secondary | ICD-10-CM | POA: Diagnosis present

## 2022-11-14 DIAGNOSIS — G8929 Other chronic pain: Secondary | ICD-10-CM | POA: Insufficient documentation

## 2022-11-14 DIAGNOSIS — M5441 Lumbago with sciatica, right side: Secondary | ICD-10-CM | POA: Diagnosis present

## 2022-12-03 ENCOUNTER — Inpatient Hospital Stay
Admission: RE | Admit: 2022-12-03 | Discharge: 2022-12-03 | Disposition: A | Discharge status: Home / Self Care | Payer: Self-pay | Source: Ambulatory Visit | Attending: Neurosurgery | Admitting: Neurosurgery

## 2022-12-03 ENCOUNTER — Other Ambulatory Visit: Payer: Self-pay

## 2022-12-03 DIAGNOSIS — Z049 Encounter for examination and observation for unspecified reason: Secondary | ICD-10-CM

## 2022-12-03 NOTE — Progress Notes (Unsigned)
Referring Physician:  Dedra Skeens, PA-C [address] [place],  [place] [address]  Primary Physician:  [facility]  History of Present Illness: 12/03/2022 Terri Woods is here today with a chief complaint of *** complaints of her lower back and both hips   Duration: *** Location: ***Lower back and bilateral hips ,legs  Quality: ***aching,burning,pulling, tingling, Severity: ***  Precipitating: aggravated by ***walking,standing,  Modifying factors: made better by *** Weakness: both legs  Timing: *** Bowel/Bladder Dysfunction: none  Conservative measures:  Physical therapy: ***  Multimodal medical therapy including regular antiinflammatories: *** iburofen Injections: *** epidural steroid injections   Interlaminar epidural steroid injection at L5-S1 11/28/2021.  Left RFA to L3, L4, L5 in 2019  Bilateral L3-5 RFA in (right 08/11/15, left 08/19/15) with significant improvement Bilateral L3-L5 RFA (right 10/29/16, left 10/15/16) Left L3-5 RFA (02/03/18)with benefit C7/T1 ILESI (11/25/18) - beneficial C7/T1 ILESI (11/13/19)   Past Surgery: ***  Terri Woods has ***no symptoms of cervical myelopathy.  The symptoms are causing a significant impact on the patient's life.   I have utilized the care everywhere function in epic to review the outside records available from external health systems.  Review of Systems:  A 10 point review of systems is negative, except for the pertinent positives and negatives detailed in the HPI.  Past Medical History: Past Medical History:  Diagnosis Date   Arthritis    neck, hips, fingers   Asthma    Back pain    Breast cancer (HCC) 2001   RT LUMPECTOMY PER PT   Carpal tunnel syndrome    right arm   Crohn's disease (HCC)    Diabetes (HCC)    Fibromyalgia    GERD (gastroesophageal reflux disease)    Glaucoma    Headache    Hypertension     Past Surgical History: Past Surgical History:   Procedure Laterality Date   BILATERAL CARPAL TUNNEL RELEASE Bilateral 02/05/2019   Procedure: BILATERAL CARPAL TUNNEL RELEASE;  Surgeon: Kennedy Bucker, MD;  Location: [facility];  Service: Orthopedics;  Laterality: Bilateral;   BREAST EXCISIONAL BIOPSY Right 15+ yrs ago   NEG   BREAST SURGERY     CATARACT EXTRACTION W/PHACO Right 10/30/2021   Procedure: CATARACT EXTRACTION PHACO AND INTRAOCULAR LENS PLACEMENT (IOC) RIGHT DIABETIC 1.85 00:20.9;  Surgeon: Nevada Crane, MD;  Location: [facility];  Service: Ophthalmology;  Laterality: Right;  Diabetic   COLONOSCOPY WITH PROPOFOL N/A 03/10/2020   Procedure: COLONOSCOPY WITH PROPOFOL;  Surgeon: Sung Amabile, DO;  Location: [facility];  Service: General;  Laterality: N/A;   FOOT SURGERY Left    LYMPH NODE BIOPSY     NASAL SEPTOPLASTY W/ TURBINOPLASTY Bilateral 12/25/2018   Procedure: NASAL SEPTOPLASTY WITH INFERIOR TURBINATE REDUCTION;  Surgeon: Vernie Murders, MD;  Location: [facility];  Service: ENT;  Laterality: Bilateral;   SHOULDER SURGERY     SMALL INTESTINE SURGERY      Allergies: Allergies as of 12/11/2022 - Review Complete 09/20/2022  Allergen Reaction Noted   Levodopa Hives 09/29/2019   Lyrica [pregabalin] Anaphylaxis and Hives 10/23/2021   Other Anaphylaxis 10/25/2021   Rotigotine Hives 01/27/2020   Erythromycin Rash and Other (See Comments) 09/20/2014   Bee venom Swelling 09/20/2014   Fentanyl Itching 10/23/2021   Hysingla er [hydrocodone bitartrate er] Itching 10/23/2021   Meloxicam Itching 10/23/2021   Doxycycline Rash 09/20/2014   Morphine Rash 10/23/2021   Penicillins Hives, Nausea And Vomiting, and Rash 01/06/2020  Shellfish allergy Rash 09/20/2014    Medications:  Current Outpatient Medications:    albuterol (VENTOLIN HFA) 108 (90 Base) MCG/ACT inhaler, Inhale 2 puffs into the lungs every 6 (six) hours as needed for wheezing or shortness of breath. (Patient taking differently: Inhale 2 puffs  into the lungs every 6 (six) hours as needed for wheezing or shortness of breath. Last used: 6 am.), Disp: 8 each, Rfl: 0   atorvastatin (LIPITOR) 20 MG tablet, Take 40 mg by mouth daily at 6 PM., Disp: , Rfl:    azelastine (ASTELIN) 0.1 % nasal spray, Place into both nostrils 2 (two) times daily as needed for rhinitis. Use in each nostril as directed, Disp: , Rfl:    azithromycin (ZITHROMAX) 250 MG tablet, Take 2 tabs today, then 1 tab daily x 4 days, Disp: 6 tablet, Rfl: 0   bimatoprost (LUMIGAN) 0.01 % SOLN, Place 1 drop into both eyes at bedtime., Disp: , Rfl:    brimonidine (ALPHAGAN) 0.2 % ophthalmic solution, Place into both eyes in the morning and at bedtime., Disp: , Rfl:    clonazePAM (KLONOPIN) 1 MG tablet, Take 1 mg by mouth 3 (three) times daily as needed., Disp: , Rfl:    cyclobenzaprine (FLEXERIL) 10 MG tablet, Take 10 mg by mouth 3 (three) times daily as needed for muscle spasms., Disp: , Rfl:    docusate sodium (COLACE) 100 MG capsule, Take 100 mg by mouth 2 (two) times daily as needed for mild constipation., Disp: , Rfl:    DULoxetine (CYMBALTA) 30 MG capsule, Take 30 mg by mouth at bedtime. Take with 60 mg for total of 90 mg, Disp: , Rfl:    DULoxetine (CYMBALTA) 60 MG capsule, Take 60 mg by mouth at bedtime. Take with 30 mg for total of 90 mg., Disp: , Rfl:    EPINEPHrine 0.3 mg/0.3 mL IJ SOAJ injection, Inject 0.3 mg into the muscle as needed for anaphylaxis. , Disp: , Rfl:    ergocalciferol (VITAMIN D2) 1.25 MG (50000 UT) capsule, Take 50,000 Units by mouth See admin instructions. Take 1 capsule (50000 units) by mouth once a week or once every other week, Disp: , Rfl:    gabapentin (NEURONTIN) 300 MG capsule, Take 1 capsule (300 mg total) by mouth 3 (three) times daily., Disp: 30 capsule, Rfl: 0   glipiZIDE (GLUCOTROL) 5 MG tablet, Take 10 mg by mouth 2 (two) times daily., Disp: , Rfl:    ibuprofen (ADVIL) 800 MG tablet, Take 800 mg by mouth every 8 (eight) hours as needed  (pain.)., Disp: , Rfl:    insulin glargine (LANTUS) 100 UNIT/ML injection, Inject 40 Units into the skin at bedtime., Disp: , Rfl:    ipratropium (ATROVENT) 0.06 % nasal spray, Place 2 sprays into both nostrils 4 (four) times daily., Disp: 15 mL, Rfl: 12   ketoconazole (NIZORAL) 2 % shampoo, Apply 1 application topically 2 (two) times a week., Disp: , Rfl:    ketorolac (TORADOL) 10 MG tablet, Take 1 tablet (10 mg total) by mouth every 6 (six) hours as needed., Disp: 20 tablet, Rfl: 0   lidocaine (LIDODERM) 5 %, Place 2 patches onto the skin daily. Remove & Discard patch within 12 hours or as directed by MD, Disp: , Rfl:    lidocaine (XYLOCAINE) 5 % ointment, Apply 1 application topically as needed (pain.). , Disp: , Rfl:    lisinopril (ZESTRIL) 2.5 MG tablet, Take 5 mg by mouth every evening., Disp: , Rfl:    miconazole (  MICOTIN) 2 % cream, Apply 1 application topically as needed (skin irritation/rash)., Disp: , Rfl:    naloxone (NARCAN) nasal spray 4 mg/0.1 mL, Place 1 spray into the nose as needed (accidental overdose)., Disp: , Rfl:    nitrofurantoin, macrocrystal-monohydrate, (MACROBID) 100 MG capsule, Take 1 capsule (100 mg total) by mouth 2 (two) times daily., Disp: 10 capsule, Rfl: 0   omeprazole (PRILOSEC) 20 MG capsule, Take 20 mg by mouth at bedtime., Disp: , Rfl:    ondansetron (ZOFRAN ODT) 8 MG disintegrating tablet, Take 1 tablet (8 mg total) by mouth every 8 (eight) hours as needed for nausea or vomiting., Disp: 20 tablet, Rfl: 0   Oxycodone HCl 10 MG TABS, Take 10 mg by mouth 3 (three) times daily., Disp: , Rfl:    oxyCODONE-acetaminophen (PERCOCET/ROXICET) 5-325 MG tablet, Take 1 tablet by mouth every 4 (four) hours as needed (pain.). , Disp: , Rfl:    QUEtiapine (SEROQUEL) 300 MG tablet, Take 300 mg by mouth at bedtime., Disp: , Rfl:    SUMAtriptan (IMITREX) 100 MG tablet, Take 100 mg by mouth every 2 (two) hours as needed for migraine. May repeat in 2 hours if headache persists or  recurs., Disp: , Rfl:    triamcinolone (NASACORT) 55 MCG/ACT AERO nasal inhaler, Place 2 sprays into the nose daily., Disp: , Rfl:    triamcinolone cream (KENALOG) 0.1 %, Apply 1 application topically 2 (two) times daily., Disp: 30 g, Rfl: 0   valACYclovir (VALTREX) 1000 MG tablet, Take 1 tablet (1,000 mg total) by mouth 3 (three) times daily., Disp: 21 tablet, Rfl: 0   VICTOZA 18 MG/3ML SOPN, SMARTSIG:0.6 Milligram(s) SUB-Q Daily, Disp: , Rfl:   Social History: Social History   Tobacco Use   Smoking status: Never   Smokeless tobacco: Never  Vaping Use   Vaping status: Never Used  Substance Use Topics   Alcohol use: No    Alcohol/week: 0.0 standard drinks of alcohol   Drug use: Yes    Types: Oxycodone    Family Medical History: Family History  Problem Relation Age of Onset   Heart attack Sister 30   Heart disease Father    Breast cancer Mother    Breast cancer Maternal Aunt     Physical Examination: There were no vitals filed for this visit.  General: Patient is in no apparent distress. Attention to examination is appropriate.  Neck:   Supple.  Full range of motion.  Respiratory: Patient is breathing without any difficulty.   NEUROLOGICAL:     Awake, alert, oriented to person, place, and time.  Speech is clear and fluent.   Cranial Nerves: Pupils equal round and reactive to light.  Facial tone is symmetric.  Facial sensation is symmetric. Shoulder shrug is symmetric. Tongue protrusion is midline.  There is no pronator drift.  Strength: Side Biceps Triceps Deltoid Interossei Grip Wrist Ext. Wrist Flex.  R 5 5 5 5 5 5 5   L 5 5 5 5 5 5 5    Side Iliopsoas Quads Hamstring PF DF EHL  R 5 5 5 5 5 5   L 5 5 5 5 5 5    Reflexes are ***2+ and symmetric at the biceps, triceps, brachioradialis, patella and achilles.   Hoffman's is absent.   Bilateral upper and lower extremity sensation is intact to light touch.    No evidence of dysmetria noted.  Gait is normal.      Medical Decision Making  Imaging: ***  I have personally reviewed  the images and agree with the above interpretation.  Assessment and Plan: Terri Woods is a pleasant 52 y.o. female with ***    Thank you for involving me in the care of this patient.      Terri Alfieri K. Myer Haff MD, [facility] Neurosurgery

## 2022-12-11 ENCOUNTER — Ambulatory Visit (INDEPENDENT_AMBULATORY_CARE_PROVIDER_SITE_OTHER): Payer: 59 | Admitting: Neurosurgery

## 2022-12-11 ENCOUNTER — Encounter: Payer: Self-pay | Admitting: Neurosurgery

## 2022-12-11 VITALS — BP 144/84 | Ht 61.0 in | Wt 170.4 lb

## 2022-12-11 DIAGNOSIS — M5416 Radiculopathy, lumbar region: Secondary | ICD-10-CM

## 2022-12-11 DIAGNOSIS — G894 Chronic pain syndrome: Secondary | ICD-10-CM

## 2022-12-11 DIAGNOSIS — I7300 Raynaud's syndrome without gangrene: Secondary | ICD-10-CM

## 2022-12-11 DIAGNOSIS — G8929 Other chronic pain: Secondary | ICD-10-CM | POA: Diagnosis not present

## 2023-01-03 ENCOUNTER — Telehealth: Payer: Self-pay | Admitting: Neurosurgery

## 2023-01-03 NOTE — Telephone Encounter (Signed)
Patient seen Dr.Yarbrough last month and he recommended SCS. She is calling that her pain is increasing. She has pain down her left side that makes it difficult to raise her left leg up to put on her socks/shoes. She has shooting pain down both legs in to her toes. She has numbness in both her big toes that hurts. First thing in the morning she has difficulty to start walking, she has stiffness in her hips down legs. Would a SCS help her symptoms?

## 2023-01-04 NOTE — Telephone Encounter (Signed)
I scheduled her with Duwayne Heck on Tuesday.

## 2023-01-07 ENCOUNTER — Other Ambulatory Visit: Payer: Self-pay

## 2023-01-07 ENCOUNTER — Ambulatory Visit
Admission: EM | Admit: 2023-01-07 | Discharge: 2023-01-07 | Disposition: A | Discharge status: Home / Self Care | Payer: 59 | Attending: Internal Medicine | Admitting: Internal Medicine

## 2023-01-07 ENCOUNTER — Encounter: Payer: Self-pay | Admitting: Emergency Medicine

## 2023-01-07 DIAGNOSIS — T50905A Adverse effect of unspecified drugs, medicaments and biological substances, initial encounter: Secondary | ICD-10-CM

## 2023-01-07 DIAGNOSIS — H60503 Unspecified acute noninfective otitis externa, bilateral: Secondary | ICD-10-CM

## 2023-01-07 DIAGNOSIS — R112 Nausea with vomiting, unspecified: Secondary | ICD-10-CM

## 2023-01-07 MED ORDER — OFLOXACIN 0.3 % OT SOLN
5.0000 [drp] | Freq: Two times a day (BID) | OTIC | 0 refills | Status: AC
Start: 2023-01-07 — End: 2023-01-14

## 2023-01-07 MED ORDER — ONDANSETRON 8 MG PO TBDP: 8.0000 mg | ORAL_TABLET | Freq: Three times a day (TID) | ORAL | 0 refills | Status: DC | PRN

## 2023-01-07 NOTE — Discharge Instructions (Signed)
Start ofloxacin antibiotic eardrops twice daily for 7 days to both of your ears.  Keep water out of the ear until you are done with your treatment.  I have refilled your Zofran as you needed for nausea or vomiting.  Your vomiting and diarrhea is likely secondary to you starting Ozempic.  I would contact your PCP to let them know you are having these side effects to see if they would like to change your dose.  You may use over-the-counter Imodium as needed for the diarrhea.  Stay hydrated with Gatorade, Powerade, Pedialyte, water.  Please follow-up with your PCP in 2 days for recheck.  Please go to the emergency room if you develop any worsening symptoms.  I hope you feel better soon!

## 2023-01-07 NOTE — ED Triage Notes (Addendum)
Left earache started 3 days ago.    Also complains of vomiting and diarrhea that started 2 days ago.  Stopped and then restarted last night.  No vomiting so far today-currently has a pepsi she is drinking.  4am was last episode of diarrhea.  Otherwise very lethargic.    Patient has used ibuprofen-none today.    Started a new injectable insulin, used once and thinks it made her sick Patient has zofran-last used at 1 am this morning

## 2023-01-07 NOTE — ED Provider Notes (Signed)
[facility]    CSN: [id number] Arrival date & time: 01/07/23  0815      History   Chief Complaint Chief Complaint  Patient presents with   Otalgia   Diarrhea   Emesis    HPI Terri Woods is a 52 y.o. female presents for ear pain as well as nausea vomiting and diarrhea.  Patient reports 3 days ago of bilateral ear pain left greater than right.  Does endorse some drainage from the ear but denies any fevers, cough or congestion, sore throat.  Denies any recent swimming or excessive water in the ears.  She has not used any OTC medications for this.  In addition she states she was started on Ozempic last week.  She has had 2 to 3 days of intermittent vomiting with several episodes of nonbloody diarrhea.  Does endorse some abdominal cramping as well.  Again no fevers.  No recent travel.  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  She has been taking her prescription of Zofran for the nausea which has helped.  She states she is able to stay hydrated and is currently drinking a Pepsi.  No other concerns at this time.   Otalgia Associated symptoms: diarrhea and vomiting   Diarrhea Associated symptoms: vomiting   Emesis Associated symptoms: diarrhea     Past Medical History:  Diagnosis Date   Arthritis    neck, hips, fingers   Asthma    Back pain    Breast cancer (HCC) 2001   RT LUMPECTOMY PER PT   Carpal tunnel syndrome    right arm   Crohn's disease (HCC)    Diabetes (HCC)    Fibromyalgia    GERD (gastroesophageal reflux disease)    Glaucoma    Headache    Hypertension     There are no problems to display for this patient.   Past Surgical History:  Procedure Laterality Date   BILATERAL CARPAL TUNNEL RELEASE Bilateral 02/05/2019   Procedure: BILATERAL CARPAL TUNNEL RELEASE;  Surgeon: Kennedy Bucker, MD;  Location: [facility];  Service: Orthopedics;  Laterality: Bilateral;   BREAST EXCISIONAL BIOPSY Right 15+ yrs ago   NEG   BREAST SURGERY      CATARACT EXTRACTION W/PHACO Right 10/30/2021   Procedure: CATARACT EXTRACTION PHACO AND INTRAOCULAR LENS PLACEMENT (IOC) RIGHT DIABETIC 1.85 00:20.9;  Surgeon: Nevada Crane, MD;  Location: [facility];  Service: Ophthalmology;  Laterality: Right;  Diabetic   COLONOSCOPY WITH PROPOFOL N/A 03/10/2020   Procedure: COLONOSCOPY WITH PROPOFOL;  Surgeon: Sung Amabile, DO;  Location: [facility];  Service: General;  Laterality: N/A;   FOOT SURGERY Left    LYMPH NODE BIOPSY     NASAL SEPTOPLASTY W/ TURBINOPLASTY Bilateral 12/25/2018   Procedure: NASAL SEPTOPLASTY WITH INFERIOR TURBINATE REDUCTION;  Surgeon: Vernie Murders, MD;  Location: [facility];  Service: ENT;  Laterality: Bilateral;   SHOULDER SURGERY     SMALL INTESTINE SURGERY      OB History   No obstetric history on file.      Home Medications    Prior to Admission medications   Medication Sig Start Date End Date Taking? Authorizing Provider  ofloxacin (FLOXIN) 0.3 % OTIC solution Place 5 drops into both ears 2 (two) times daily for 7 days. 01/07/23 01/14/23 Yes Radford Pax, NP  albuterol (VENTOLIN HFA) 108 (90 Base) MCG/ACT inhaler Inhale 2 puffs into the lungs every 6 (six) hours as needed for wheezing or shortness of breath. Patient  taking differently: Inhale 2 puffs into the lungs every 6 (six) hours as needed for wheezing or shortness of breath. Last used: 6 am. 05/26/21   Rushie Chestnut, PA-C  atorvastatin (LIPITOR) 20 MG tablet Take 40 mg by mouth daily at 6 PM.    [provider]  azelastine (ASTELIN) 0.1 % nasal spray Place into both nostrils 2 (two) times daily as needed for rhinitis. Use in each nostril as directed    [provider]  bimatoprost (LUMIGAN) 0.01 % SOLN Place 1 drop into both eyes at bedtime.    [provider]  brimonidine (ALPHAGAN) 0.2 % ophthalmic solution Place into both eyes in the morning and at bedtime.    [provider]  clonazePAM (KLONOPIN)  1 MG tablet Take 1 mg by mouth 3 (three) times daily as needed.    [provider]  cyclobenzaprine (FLEXERIL) 10 MG tablet Take 10 mg by mouth 3 (three) times daily as needed for muscle spasms.    [provider]  docusate sodium (COLACE) 100 MG capsule Take 100 mg by mouth 2 (two) times daily as needed for mild constipation.    [provider]  DULoxetine (CYMBALTA) 30 MG capsule Take 30 mg by mouth at bedtime. Take with 60 mg for total of 90 mg    [provider]  DULoxetine (CYMBALTA) 60 MG capsule Take 60 mg by mouth at bedtime. Take with 30 mg for total of 90 mg.    [provider]  EPINEPHrine 0.3 mg/0.3 mL IJ SOAJ injection Inject 0.3 mg into the muscle as needed for anaphylaxis.     [provider]  ergocalciferol (VITAMIN D2) 1.25 MG (50000 UT) capsule Take 50,000 Units by mouth See admin instructions. Take 1 capsule (50000 units) by mouth once a week or once every other week    [provider]  gabapentin (NEURONTIN) 300 MG capsule Take 1 capsule (300 mg total) by mouth 3 (three) times daily. Patient not taking: Reported on 01/07/2023 03/13/21   Becky Augusta, NP  glipiZIDE (GLUCOTROL) 5 MG tablet Take 10 mg by mouth 2 (two) times daily.    [provider]  ibuprofen (ADVIL) 800 MG tablet Take 800 mg by mouth every 8 (eight) hours as needed (pain.).    [provider]  insulin glargine (LANTUS) 100 UNIT/ML injection Inject 40 Units into the skin at bedtime.    [provider]  ipratropium (ATROVENT) 0.06 % nasal spray Place 2 sprays into both nostrils 4 (four) times daily. 06/01/21   Becky Augusta, NP  ketoconazole (NIZORAL) 2 % shampoo Apply 1 application topically 2 (two) times a week.    [provider]  ketorolac (TORADOL) 10 MG tablet Take 1 tablet (10 mg total) by mouth every 6 (six) hours as needed. Patient not taking: Reported on 01/07/2023 05/27/20   Becky Augusta, NP  lidocaine (LIDODERM)  5 % Place 2 patches onto the skin daily. Remove & Discard patch within 12 hours or as directed by MD    [provider]  lidocaine (XYLOCAINE) 5 % ointment Apply 1 application topically as needed (pain.).     [provider]  lisinopril (ZESTRIL) 2.5 MG tablet Take 5 mg by mouth every evening.    [provider]  miconazole (MICOTIN) 2 % cream Apply 1 application topically as needed (skin irritation/rash).    [provider]  naloxone [facility]) nasal spray 4 mg/0.1 mL Place 1 spray into the nose as needed (  accidental overdose).    [provider]  omeprazole (PRILOSEC) 20 MG capsule Take 20 mg by mouth at bedtime.    [provider]  ondansetron (ZOFRAN ODT) 8 MG disintegrating tablet Take 1 tablet (8 mg total) by mouth every 8 (eight) hours as needed for nausea or vomiting. 01/07/23   Radford Pax, NP  Oxycodone HCl 10 MG TABS Take 10 mg by mouth 3 (three) times daily.    [provider]  oxyCODONE-acetaminophen (PERCOCET/ROXICET) 5-325 MG tablet Take 1 tablet by mouth every 4 (four) hours as needed (pain.).     [provider]  QUEtiapine (SEROQUEL) 300 MG tablet Take 300 mg by mouth at bedtime.    [provider]  SUMAtriptan (IMITREX) 100 MG tablet Take 100 mg by mouth every 2 (two) hours as needed for migraine. May repeat in 2 hours if headache persists or recurs.    [provider]  triamcinolone (NASACORT) 55 MCG/ACT AERO nasal inhaler Place 2 sprays into the nose daily.    [provider]  triamcinolone cream (KENALOG) 0.1 % Apply 1 application topically 2 (two) times daily. 08/31/19   Payton Mccallum, MD  valACYclovir (VALTREX) 1000 MG tablet Take 1 tablet (1,000 mg total) by mouth 3 (three) times daily. 03/13/21   Becky Augusta, NP  VICTOZA 18 MG/3ML SOPN SMARTSIG:0.6 Milligram(s) SUB-Q Daily 02/17/21   [provider]  topiramate (TOPAMAX) 100 MG tablet Take 100 mg by mouth 2 (two) times  daily.  01/06/20  [provider]    Family History Family History  Problem Relation Age of Onset   Heart attack Sister 88   Heart disease Father    Breast cancer Mother    Breast cancer Maternal Aunt     Social History Social History   Tobacco Use   Smoking status: Never   Smokeless tobacco: Never  Vaping Use   Vaping status: Never Used  Substance Use Topics   Alcohol use: No    Alcohol/week: 0.0 standard drinks of alcohol   Drug use: Yes    Types: Oxycodone     Allergies   Levodopa, Lyrica [pregabalin], Other, Rotigotine, Erythromycin, Bee venom, Fentanyl, Hysingla er [hydrocodone bitartrate er], Meloxicam, Doxycycline, Morphine, Penicillins, and Shellfish allergy   Review of Systems Review of Systems  HENT:  Positive for ear pain.   Gastrointestinal:  Positive for diarrhea and vomiting.     Physical Exam Triage Vital Signs ED Triage Vitals  Encounter Vitals Group     BP 01/07/23 0827 (!) 159/80     Systolic BP Percentile --      Diastolic BP Percentile --      Pulse Rate 01/07/23 0827 90     Resp 01/07/23 0827 20     Temp 01/07/23 0827 97.9 F (36.6 C)     Temp Source 01/07/23 0827 Oral     SpO2 01/07/23 0827 96 %     Weight --      Height --      Head Circumference --      Peak Flow --      Pain Score 01/07/23 0823 6     Pain Loc --      Pain Education --      Exclude from Growth Chart --    No data found.  Updated Vital Signs BP (!) 159/80 (BP Location: Left Arm)   Pulse 90   Temp 97.9 F (36.6 C) (Oral)   Resp 20   LMP 12/17/2022 (  Approximate)   SpO2 96%   Visual Acuity Right Eye Distance:   Left Eye Distance:   Bilateral Distance:    Right Eye Near:   Left Eye Near:    Bilateral Near:     Physical Exam Vitals and nursing note reviewed.  Constitutional:      General: She is not in acute distress.    Appearance: Normal appearance. She is not ill-appearing, toxic-appearing or diaphoretic.  HENT:     Head: Normocephalic  and atraumatic.     Right Ear: Tympanic membrane normal. Drainage and swelling present. Tympanic membrane is not erythematous.     Left Ear: Tympanic membrane normal. Drainage and swelling present. Tympanic membrane is not erythematous.     Ears:     Comments: Moderate swelling of bilateral canals with minimal mucopurulent drainage noted    Nose: Nose normal.     Mouth/Throat:     Mouth: Mucous membranes are moist.     Pharynx: No posterior oropharyngeal erythema.  Eyes:     Conjunctiva/sclera: Conjunctivae normal.     Pupils: Pupils are equal, round, and reactive to light.  Cardiovascular:     Rate and Rhythm: Normal rate and regular rhythm.     Heart sounds: Normal heart sounds.  Pulmonary:     Effort: Pulmonary effort is normal.     Breath sounds: Normal breath sounds.  Abdominal:     General: Bowel sounds are normal.     Palpations: Abdomen is soft. There is no hepatomegaly or splenomegaly.     Tenderness: There is generalized abdominal tenderness. Negative signs include Rovsing's sign and McBurney's sign.  Skin:    General: Skin is warm and dry.  Neurological:     General: No focal deficit present.     Mental Status: She is alert and oriented to person, place, and time.  Psychiatric:        Mood and Affect: Mood normal.        Behavior: Behavior normal.      UC Treatments / Results  Labs (all labs ordered are listed, but only abnormal results are displayed) Labs Reviewed - No data to display  EKG   Radiology No results found.  Procedures Procedures (including critical care time)  Medications Ordered in UC Medications - No data to display  Initial Impression / Assessment and Plan / UC Course  I have reviewed the triage vital signs and the nursing notes.  Pertinent labs & imaging results that were available during my care of the patient were reviewed by me and considered in my medical decision making (see chart for details).     Reviewed exam and symptoms  with patient.  Will start ofloxacin antibiotic drops for bilateral otitis externa.  Instructed to keep water out of the ear until treatment is complete and she verbalized understanding.  Reviewed her symptoms of nausea vomiting and diarrhea.  Discussed likely secondary to starting Ozempic.  I did refill her Zofran as requested.  Advise she contact her PCP to let her know she is having side effects to the medications to see if they want to make any changes.  Discussed brat diet and encouraged hydration/electrolyte replacement.  PCP follow-up in 2 days for recheck.  ER precautions reviewed and patient verbalized understanding. Final Clinical Impressions(s) / UC Diagnoses   Final diagnoses:  Acute otitis externa of both ears, unspecified type  Nausea and vomiting, unspecified vomiting type  Adverse effect of drug, initial encounter     Discharge Instructions  Start ofloxacin antibiotic eardrops twice daily for 7 days to both of your ears.  Keep water out of the ear until you are done with your treatment.  I have refilled your Zofran as you needed for nausea or vomiting.  Your vomiting and diarrhea is likely secondary to you starting Ozempic.  I would contact your PCP to let them know you are having these side effects to see if they would like to change your dose.  You may use over-the-counter Imodium as needed for the diarrhea.  Stay hydrated with Gatorade, Powerade, Pedialyte, water.  Please follow-up with your PCP in 2 days for recheck.  Please go to the emergency room if you develop any worsening symptoms.  I hope you feel better soon!    ED Prescriptions     Medication Sig Dispense Auth. Provider   ofloxacin (FLOXIN) 0.3 % OTIC solution Place 5 drops into both ears 2 (two) times daily for 7 days. 5 mL Radford Pax, NP   ondansetron (ZOFRAN ODT) 8 MG disintegrating tablet Take 1 tablet (8 mg total) by mouth every 8 (eight) hours as needed for nausea or vomiting. 20 tablet Radford Pax, NP       PDMP not reviewed this encounter.   Radford Pax, NP 01/07/23 [phone]

## 2023-01-08 ENCOUNTER — Encounter: Payer: Self-pay | Admitting: Neurosurgery

## 2023-01-08 ENCOUNTER — Ambulatory Visit: Payer: 59 | Admitting: Neurosurgery

## 2023-01-08 VITALS — BP 128/82 | Ht 61.0 in | Wt 165.0 lb

## 2023-01-08 DIAGNOSIS — M5416 Radiculopathy, lumbar region: Secondary | ICD-10-CM | POA: Diagnosis not present

## 2023-01-08 DIAGNOSIS — G609 Hereditary and idiopathic neuropathy, unspecified: Secondary | ICD-10-CM

## 2023-01-08 NOTE — Progress Notes (Signed)
Referring Physician:  No referring provider defined for this encounter.  Primary Physician:  [facility]  History of Present Illness: 01/08/23 Terri Woods is a 52 year old presenting today for ongoing chronic low back and bilateral leg pain.  She was seen by Dr. Marcell Barlow a month ago with the same symptoms and he did not feel as though there was an indication for surgery. Today she reports trouble lifting both of her legs over the last couple of weeks as well as increased mid back pain. She was seen by Dr. Sandie Ano this morning [facility] neurosurgery) who ordered updated xrays, MRI C and T spine as well as an EMG.   12/11/22 Dr. Myer Haff Ms. Samanthan Vivirito is here today with a chief complaint of low back pain that radiates into the and bilateral hips and legs.   She has been having pain for many years in multiple locations.  She gets shooting pains down her legs.  She has severe back pain that is impacted by walking, standing, bending, and lifting.  Nothing really helps.  Her pain is very severe.  She is in the pain clinic and has been undergoing multimodal pain management for some time.  Nothing has really helped her.   Bowel/Bladder Dysfunction: none  Conservative measures: seen a chiropractor, had massage  Physical therapy: has participated in Home Health PT about 6 months ago.  Multimodal medical therapy including regular antiinflammatories:  ibuprofen, cymbalta, gabapentin, lidocaine patch, oxycodone Injections:  has received epidural steroid injections Interlaminar epidural steroid injection at L5-S1 11/28/2021.  Left RFA to L3, L4, L5 in 2019  Bilateral L3-5 RFA in (right 08/11/15, left 08/19/15) with significant improvement Bilateral L3-L5 RFA (right 10/29/16, left 10/15/16) Left L3-5 RFA (02/03/18)with benefit C7/T1 ILESI (11/25/18) - beneficial C7/T1 ILESI (11/13/19)   Past Surgery: denies  Matthew Folks has no symptoms of cervical myelopathy.  The symptoms are  causing a significant impact on the patient's life.   I have utilized the care everywhere function in epic to review the outside records available from external health systems.  Review of Systems:  A 10 point review of systems is negative, except for the pertinent positives and negatives detailed in the HPI.  Past Medical History: Past Medical History:  Diagnosis Date   Arthritis    neck, hips, fingers   Asthma    Back pain    Breast cancer (HCC) 2001   RT LUMPECTOMY PER PT   Carpal tunnel syndrome    right arm   Crohn's disease (HCC)    Diabetes (HCC)    Fibromyalgia    GERD (gastroesophageal reflux disease)    Glaucoma    Headache    Hypertension     Past Surgical History: Past Surgical History:  Procedure Laterality Date   BILATERAL CARPAL TUNNEL RELEASE Bilateral 02/05/2019   Procedure: BILATERAL CARPAL TUNNEL RELEASE;  Surgeon: Kennedy Bucker, MD;  Location: [facility] ORS;  Service: Orthopedics;  Laterality: Bilateral;   BREAST EXCISIONAL BIOPSY Right 15+ yrs ago   NEG   BREAST SURGERY     CATARACT EXTRACTION W/PHACO Right 10/30/2021   Procedure: CATARACT EXTRACTION PHACO AND INTRAOCULAR LENS PLACEMENT (IOC) RIGHT DIABETIC 1.85 00:20.9;  Surgeon: Nevada Crane, MD;  Location: [facility];  Service: Ophthalmology;  Laterality: Right;  Diabetic   COLONOSCOPY WITH PROPOFOL N/A 03/10/2020   Procedure: COLONOSCOPY WITH PROPOFOL;  Surgeon: Sung Amabile, DO;  Location: [facility] ENDOSCOPY;  Service: General;  Laterality: N/A;   FOOT SURGERY Left  LYMPH NODE BIOPSY     NASAL SEPTOPLASTY W/ TURBINOPLASTY Bilateral 12/25/2018   Procedure: NASAL SEPTOPLASTY WITH INFERIOR TURBINATE REDUCTION;  Surgeon: Vernie Murders, MD;  Location: [facility];  Service: ENT;  Laterality: Bilateral;   SHOULDER SURGERY     SMALL INTESTINE SURGERY      Allergies: Allergies as of 01/08/2023 - Review Complete 01/07/2023  Allergen Reaction Noted   Levodopa Hives 09/29/2019   Lyrica  [pregabalin] Anaphylaxis and Hives 10/23/2021   Other Anaphylaxis 10/25/2021   Rotigotine Hives 01/27/2020   Erythromycin Rash and Other (See Comments) 09/20/2014   Bee venom Swelling 09/20/2014   Fentanyl Itching 10/23/2021   Hysingla er [hydrocodone bitartrate er] Itching 10/23/2021   Meloxicam Itching 10/23/2021   Doxycycline Rash 09/20/2014   Morphine Rash 10/23/2021   Penicillins Hives, Nausea And Vomiting, and Rash 01/06/2020   Shellfish allergy Rash 09/20/2014    Medications:  Current Outpatient Medications:    albuterol (VENTOLIN HFA) 108 (90 Base) MCG/ACT inhaler, Inhale 2 puffs into the lungs every 6 (six) hours as needed for wheezing or shortness of breath. (Patient taking differently: Inhale 2 puffs into the lungs every 6 (six) hours as needed for wheezing or shortness of breath. Last used: 6 am.), Disp: 8 each, Rfl: 0   atorvastatin (LIPITOR) 20 MG tablet, Take 40 mg by mouth daily at 6 PM., Disp: , Rfl:    azelastine (ASTELIN) 0.1 % nasal spray, Place into both nostrils 2 (two) times daily as needed for rhinitis. Use in each nostril as directed, Disp: , Rfl:    bimatoprost (LUMIGAN) 0.01 % SOLN, Place 1 drop into both eyes at bedtime., Disp: , Rfl:    brimonidine (ALPHAGAN) 0.2 % ophthalmic solution, Place into both eyes in the morning and at bedtime., Disp: , Rfl:    clonazePAM (KLONOPIN) 1 MG tablet, Take 1 mg by mouth 3 (three) times daily as needed., Disp: , Rfl:    cyclobenzaprine (FLEXERIL) 10 MG tablet, Take 10 mg by mouth 3 (three) times daily as needed for muscle spasms., Disp: , Rfl:    docusate sodium (COLACE) 100 MG capsule, Take 100 mg by mouth 2 (two) times daily as needed for mild constipation., Disp: , Rfl:    DULoxetine (CYMBALTA) 30 MG capsule, Take 30 mg by mouth at bedtime. Take with 60 mg for total of 90 mg, Disp: , Rfl:    DULoxetine (CYMBALTA) 60 MG capsule, Take 60 mg by mouth at bedtime. Take with 30 mg for total of 90 mg., Disp: , Rfl:    EPINEPHrine  0.3 mg/0.3 mL IJ SOAJ injection, Inject 0.3 mg into the muscle as needed for anaphylaxis. , Disp: , Rfl:    ergocalciferol (VITAMIN D2) 1.25 MG (50000 UT) capsule, Take 50,000 Units by mouth See admin instructions. Take 1 capsule (50000 units) by mouth once a week or once every other week, Disp: , Rfl:    gabapentin (NEURONTIN) 300 MG capsule, Take 1 capsule (300 mg total) by mouth 3 (three) times daily. (Patient not taking: Reported on 01/07/2023), Disp: 30 capsule, Rfl: 0   glipiZIDE (GLUCOTROL) 5 MG tablet, Take 10 mg by mouth 2 (two) times daily., Disp: , Rfl:    ibuprofen (ADVIL) 800 MG tablet, Take 800 mg by mouth every 8 (eight) hours as needed (pain.)., Disp: , Rfl:    insulin glargine (LANTUS) 100 UNIT/ML injection, Inject 40 Units into the skin at bedtime., Disp: , Rfl:    ipratropium (ATROVENT) 0.06 % nasal spray, Place  2 sprays into both nostrils 4 (four) times daily., Disp: 15 mL, Rfl: 12   ketoconazole (NIZORAL) 2 % shampoo, Apply 1 application topically 2 (two) times a week., Disp: , Rfl:    ketorolac (TORADOL) 10 MG tablet, Take 1 tablet (10 mg total) by mouth every 6 (six) hours as needed. (Patient not taking: Reported on 01/07/2023), Disp: 20 tablet, Rfl: 0   lidocaine (LIDODERM) 5 %, Place 2 patches onto the skin daily. Remove & Discard patch within 12 hours or as directed by MD, Disp: , Rfl:    lidocaine (XYLOCAINE) 5 % ointment, Apply 1 application topically as needed (pain.). , Disp: , Rfl:    lisinopril (ZESTRIL) 2.5 MG tablet, Take 5 mg by mouth every evening., Disp: , Rfl:    miconazole (MICOTIN) 2 % cream, Apply 1 application topically as needed (skin irritation/rash)., Disp: , Rfl:    naloxone (NARCAN) nasal spray 4 mg/0.1 mL, Place 1 spray into the nose as needed (accidental overdose)., Disp: , Rfl:    ofloxacin (FLOXIN) 0.3 % OTIC solution, Place 5 drops into both ears 2 (two) times daily for 7 days., Disp: 5 mL, Rfl: 0   omeprazole (PRILOSEC) 20 MG capsule, Take 20 mg by  mouth at bedtime., Disp: , Rfl:    ondansetron (ZOFRAN ODT) 8 MG disintegrating tablet, Take 1 tablet (8 mg total) by mouth every 8 (eight) hours as needed for nausea or vomiting., Disp: 20 tablet, Rfl: 0   Oxycodone HCl 10 MG TABS, Take 10 mg by mouth 3 (three) times daily., Disp: , Rfl:    oxyCODONE-acetaminophen (PERCOCET/ROXICET) 5-325 MG tablet, Take 1 tablet by mouth every 4 (four) hours as needed (pain.). , Disp: , Rfl:    QUEtiapine (SEROQUEL) 300 MG tablet, Take 300 mg by mouth at bedtime., Disp: , Rfl:    SUMAtriptan (IMITREX) 100 MG tablet, Take 100 mg by mouth every 2 (two) hours as needed for migraine. May repeat in 2 hours if headache persists or recurs., Disp: , Rfl:    triamcinolone (NASACORT) 55 MCG/ACT AERO nasal inhaler, Place 2 sprays into the nose daily., Disp: , Rfl:    triamcinolone cream (KENALOG) 0.1 %, Apply 1 application topically 2 (two) times daily., Disp: 30 g, Rfl: 0   valACYclovir (VALTREX) 1000 MG tablet, Take 1 tablet (1,000 mg total) by mouth 3 (three) times daily., Disp: 21 tablet, Rfl: 0   VICTOZA 18 MG/3ML SOPN, SMARTSIG:0.6 Milligram(s) SUB-Q Daily, Disp: , Rfl:   Social History: Social History   Tobacco Use   Smoking status: Never   Smokeless tobacco: Never  Vaping Use   Vaping status: Never Used  Substance Use Topics   Alcohol use: No    Alcohol/week: 0.0 standard drinks of alcohol   Drug use: Yes    Types: Oxycodone    Family Medical History: Family History  Problem Relation Age of Onset   Heart attack Sister 30   Heart disease Father    Breast cancer Mother    Breast cancer Maternal Aunt     Physical Examination: There were no vitals filed for this visit.   General: Patient is in moderate distress. Attention to examination is appropriate.  Neck:   Supple.  Full range of motion.  Respiratory: Patient is breathing without any difficulty.   NEUROLOGICAL:     Awake, alert, oriented to person, place, and time.  Speech is clear and  fluent.   Cranial Nerves: Pupils equal round and reactive to light.  Facial tone  is symmetric.  Facial sensation is symmetric. Shoulder shrug is symmetric. Tongue protrusion is midline.  There is no pronator drift.  Strength: Side Biceps Triceps Deltoid Interossei Grip Wrist Ext. Wrist Flex.  R 5 5 5 5 5 5 5   L 5 5 5 5 5 5 5    Side Iliopsoas Quads Hamstring PF DF EHL  R 5 5 5 5 5 5   L 5 5 5 5 5 5    Reflexes are 1+ and symmetric at the biceps, triceps, brachioradialis, patella and achilles.   Hoffman's is absent.   Bilateral upper and lower extremity sensation is intact to light touch.    No evidence of dysmetria noted.  Gait is antalgic.     Medical Decision Making  Imaging: MRI L spine 11/14/2022  IMPRESSION: 1. No acute findings or explanation for the patient's symptoms. 2. Mild disc bulging and facet hypertrophy at L3-4 and L4-5 without resulting spinal stenosis or nerve root encroachment. 3. Previously demonstrated left subarticular zone disc protrusion at L5-S1 has largely involuted.     Electronically Signed   By: Carey Bullocks M.D.   On: 11/23/2022 14:05 I have personally reviewed the images and agree with the above interpretation.  Assessment and Plan: Ms. Lyndon is a pleasant 52 y.o. female with severe back and leg pain without obvious cause who is not a candidate for surgical intervention.  The patient is currently seeing Dr. Sandie Ano at [facility] for second opinion.  He ordered updated imaging of her cervical and thoracic spine as well as an EMG.  I will defer any further imaging at this time.  We discussed the potential of a spinal cord stimulator.  She would need a thoracic MRI as well as a psych evaluation.  She would like to complete her other workup.  She will let me know when her MRIs are scheduled and I can review them should she so desire.  We will see the patient going forward on an as-needed basis.  She expressed understanding and was in agreement with this  plan.  I spent a total of 35 minutes in both face-to-face and non-face-to-face activities for this visit on the date of this encounter including review of outside records, review of imaging, discussion of symptoms, physical exam, and documentation.   Manning Charity PA-C Neurosurgery

## 2023-01-08 NOTE — Patient Instructions (Addendum)
[facility] - televisits [phone] Not in network with [facility] - televisits [phone] Not in network with [facility] in [place] [phone] Accepts [facility] Dr Kieth Brightly in [place] [phone] Accepts [facility], but is typically booked out about 10 months Dr Orie Fisherman in [place] - also does televisits [phone] Dr Irish Lack in [place] [phone] Dr Mindi Slicker in [place] - also does televisits [phone] [facility] (offices in [place], [place], and [place]) [phone]   Please let us know which one of the above psychologist's you would like to see for evaluation prior to the spinal cord stimulator trial. These are the only providers we are aware of that perform this type of evaluation. Once we fax the referral, please call them to set up an appointment (they do not typically call you).

## 2023-01-21 ENCOUNTER — Emergency Department
Admission: EM | Admit: 2023-01-21 | Discharge: 2023-01-21 | Disposition: A | Discharge status: Home / Self Care | Payer: 59 | Attending: Emergency Medicine | Admitting: Emergency Medicine

## 2023-01-21 ENCOUNTER — Other Ambulatory Visit: Payer: Self-pay

## 2023-01-21 DIAGNOSIS — T7840XA Allergy, unspecified, initial encounter: Secondary | ICD-10-CM | POA: Insufficient documentation

## 2023-01-21 DIAGNOSIS — R0989 Other specified symptoms and signs involving the circulatory and respiratory systems: Secondary | ICD-10-CM

## 2023-01-21 DIAGNOSIS — R070 Pain in throat: Secondary | ICD-10-CM | POA: Insufficient documentation

## 2023-01-21 LAB — BASIC METABOLIC PANEL
Anion gap: 12 (ref 5–15)
BUN: 27 mg/dL — ABNORMAL HIGH (ref 6–20)
CO2: 23 mmol/L (ref 22–32)
Calcium: 9.1 mg/dL (ref 8.9–10.3)
Chloride: 102 mmol/L (ref 98–111)
Creatinine, Ser: 0.91 mg/dL (ref 0.44–1.00)
GFR, Estimated: >60 mL/min (ref >60)
Glucose, Bld: 214 mg/dL — ABNORMAL HIGH (ref 70–99)
Potassium: 4.1 mmol/L (ref 3.5–5.1)
Sodium: 137 mmol/L (ref 135–145)

## 2023-01-21 LAB — CBC
HCT: 37 % (ref 36.0–46.0)
Hemoglobin: 12.2 g/dL (ref 12.0–15.0)
MCH: 28.1 pg (ref 26.0–34.0)
MCHC: 33 g/dL (ref 30.0–36.0)
MCV: 85.3 fL (ref 80.0–100.0)
Platelets: 165 10*3/uL (ref 150–400)
RBC: 4.34 MIL/uL (ref 3.87–5.11)
RDW: 13.4 % (ref 11.5–15.5)
WBC: 5.7 10*3/uL (ref 4.0–10.5)
nRBC: 0 % (ref 0.0–0.2)

## 2023-01-21 MED ORDER — KETOROLAC TROMETHAMINE 15 MG/ML IJ SOLN
15.0000 mg | Freq: Once | INTRAMUSCULAR | Status: AC
  Administered 2023-01-21: 15 mg via INTRAVENOUS
  Filled 2023-01-21: qty 1

## 2023-01-21 MED ORDER — EPINEPHRINE 0.3 MG/0.3ML IJ SOAJ: 0.3000 mg | INTRAMUSCULAR | 1 refills | Status: DC | PRN

## 2023-01-21 MED ORDER — FAMOTIDINE IN NACL 20-0.9 MG/50ML-% IV SOLN
20.0000 mg | Freq: Once | INTRAVENOUS | Status: AC
  Administered 2023-01-21: 20 mg via INTRAVENOUS
  Filled 2023-01-21: qty 50

## 2023-01-21 MED ORDER — ONDANSETRON HCL 4 MG/2ML IJ SOLN
4.0000 mg | Freq: Once | INTRAMUSCULAR | Status: AC
  Administered 2023-01-21: 4 mg via INTRAVENOUS
  Filled 2023-01-21: qty 2

## 2023-01-21 MED ORDER — DIPHENHYDRAMINE HCL 50 MG/ML IJ SOLN
25.0000 mg | Freq: Once | INTRAMUSCULAR | Status: AC
  Administered 2023-01-21: 25 mg via INTRAVENOUS
  Filled 2023-01-21: qty 1

## 2023-01-21 MED ORDER — EPINEPHRINE 0.3 MG/0.3ML IJ SOAJ
0.3000 mg | Freq: Once | INTRAMUSCULAR | Status: AC
  Administered 2023-01-21: 0.3 mg via INTRAMUSCULAR
  Filled 2023-01-21: qty 0.3

## 2023-01-21 MED ORDER — PREDNISONE 20 MG PO TABS: 40.0000 mg | ORAL_TABLET | Freq: Every day | ORAL | 0 refills | Status: AC

## 2023-01-21 MED ORDER — METHYLPREDNISOLONE SODIUM SUCC 125 MG IJ SOLR
125.0000 mg | Freq: Once | INTRAMUSCULAR | Status: AC
  Administered 2023-01-21: 125 mg via INTRAVENOUS
  Filled 2023-01-21: qty 2

## 2023-01-21 NOTE — ED Provider Notes (Signed)
[facility] Provider Note    Event Date/Time   First MD Initiated Contact with Patient 01/21/23 1332     (approximate)   History   Allergic Reaction and Dizziness   HPI Terri Woods is a 52 y.o. female presenting today for possible allergic reaction.  Patient notes approximately 30 minutes ago she started having sensation of swelling in her throat and difficulty breathing.  Symptoms have progressed and she feels like she has a difficult time getting any air in with her throat closing up.  Reports history of allergic reactions to bee stings but otherwise denies any prior anaphylaxis.  She does note starting a new insulin medication recently within the past week but no other new medications, detergents, or other obvious allergens.  Noted a rash on her arms but no rash elsewhere.     Physical Exam   Triage Vital Signs: ED Triage Vitals  Encounter Vitals Group     BP 01/21/23 1335 (!) 153/103     Systolic BP Percentile --      Diastolic BP Percentile --      Pulse Rate 01/21/23 1335 88     Resp 01/21/23 1336 18     Temp 01/21/23 1335 97.9 F (36.6 C)     Temp Source 01/21/23 1335 Oral     SpO2 01/21/23 1335 98 %     Weight --      Height 01/21/23 1335 5\' 1"  (1.549 m)     Head Circumference --      Peak Flow --      Pain Score 01/21/23 1334 4     Pain Loc --      Pain Education --      Exclude from Growth Chart --     Most recent vital signs: Vitals:   01/21/23 1400 01/21/23 1415  BP: (!) 159/76   Pulse: 96 96  Resp: 20 (!) 22  Temp:    SpO2: 97% 97%   Physical Exam: I have reviewed the vital signs and nursing notes. General: Awake, alert, no acute distress.  Nontoxic appearing. Head:  Atraumatic, normocephalic.   ENT:  EOM intact, PERRL. Oral mucosa is pink and moist with no lesions.  No angioedema present. Neck: Neck is supple with full range of motion, No meningeal signs.  No swelling noted to the neck. Cardiovascular:  RRR, No  murmurs. Peripheral pulses palpable and equal bilaterally. Respiratory: Diminished air movement throughout without obvious wheezing noted in lungs. Musculoskeletal:  No cyanosis or edema. Moving extremities with full ROM Abdomen:  Soft, nontender, nondistended. Neuro:  GCS 15, moving all four extremities, interacting appropriately. Speech clear. Psych:  Calm, appropriate.   Skin:  Warm, dry, no rash.     ED Results / Procedures / Treatments   Labs (all labs ordered are listed, but only abnormal results are displayed) Labs Reviewed  BASIC METABOLIC PANEL - Abnormal; Notable for the following components:      Result Value   Glucose, Bld 214 (*)    BUN 27 (*)    All other components within normal limits  CBC     EKG My EKG interpretation: Rate of 86, normal sinus rhythm, normal axis, normal intervals.  No acute ST elevations or depressions   RADIOLOGY    PROCEDURES:  Critical Care performed: Yes, see critical care procedure note(s)  .Critical Care  Performed by: Janith Lima, MD Authorized by: Janith Lima, MD   Critical care provider statement:  Critical care time (minutes):  35   Critical care was necessary to treat or prevent imminent or life-threatening deterioration of the following conditions: Anaphylaxis.   Critical care was time spent personally by me on the following activities:  Development of treatment plan with patient or surrogate, discussions with consultants, evaluation of patient's response to treatment, examination of patient, ordering and review of laboratory studies, ordering and review of radiographic studies, ordering and performing treatments and interventions, pulse oximetry, re-evaluation of patient's condition and review of old charts    MEDICATIONS ORDERED IN ED: Medications  EPINEPHrine (EPI-PEN) injection 0.3 mg (0.3 mg Intramuscular Given 01/21/23 1344)  methylPREDNISolone sodium succinate (SOLU-MEDROL) 125 mg/2 mL injection 125 mg (125  mg Intravenous Given 01/21/23 1346)  famotidine (PEPCID) IVPB 20 mg premix (0 mg Intravenous Stopped 01/21/23 1440)  diphenhydrAMINE (BENADRYL) injection 25 mg (25 mg Intravenous Given 01/21/23 1346)  ondansetron (ZOFRAN) injection 4 mg (4 mg Intravenous Given 01/21/23 1416)  ketorolac (TORADOL) 15 MG/ML injection 15 mg (15 mg Intravenous Given 01/21/23 1416)     IMPRESSION / MDM / ASSESSMENT AND PLAN / ED COURSE  I reviewed the triage vital signs and the nursing notes.                              Differential diagnosis includes, but is not limited to, anaphylactic reaction, less severe allergic reaction.  Patient's presentation is most consistent with acute presentation with potential threat to life or bodily function.  Is a 52 year old female presenting today for difficulty breathing and sensation of throat swelling.  Does seem to be gasping for breath on arrival but no obvious wheezing noted in lungs.  Given concern for anaphylactic reaction, patient given epinephrine, steroids, Benadryl, and Pepcid.  Oxygenation remained in the mid to upper 90s throughout my observation time.  Did have subsequent headache and nausea after epinephrine and was given Toradol and Zofran.  Reassessed patient with no pain symptoms but otherwise breathing feeling well.  Will observe patient for 3 to 4 hours in the emergency department.  Patient was agreeable with this plan.  Patient was signed out to oncoming provider while awaiting continued observation.  The patient is on the cardiac monitor to evaluate for evidence of arrhythmia and/or significant heart rate changes. Clinical Course as of 01/21/23 1531  Mon Jan 21, 2023  1400 CBC Unremarkable [DW]  1412 Reassessed patient following medication administration.  Minimal difficulty breathing at this time.  100% on room air.  No tachypnea [DW]  1414 Basic metabolic panelTheda Sers [DW]    Clinical Course User Index [DW] Janith Lima, MD     FINAL CLINICAL  IMPRESSION(S) / ED DIAGNOSES   Final diagnoses:  Allergic reaction, initial encounter  Throat tightness     Rx / DC Orders   ED Discharge Orders     None        Note:  This document was prepared using Dragon voice recognition software and may include unintentional dictation errors.   Janith Lima, MD 01/21/23 [phone]

## 2023-01-21 NOTE — ED Provider Notes (Signed)
-----------------------------------------   4:52 PM on 01/21/2023 ----------------------------------------- Patient states she feels back to her normal self.  She is requesting discharge home.  States she is hungry and wishes to go home to eat.  Patient's vital signs are reassuring.  Patient denies any swelling of her throat or tongue.  Denies any shortness of breath.  Patient has no complaints currently.  States she has EpiPen's at home but needs a refill.  Will prescribe a refill as well as 5 days of prednisone to prevent rebound reaction.  Patient agreeable to plan of care.  Provided my typical anaphylaxis return precautions.   Minna Antis, MD 01/21/23 [phone]

## 2023-01-21 NOTE — ED Triage Notes (Addendum)
Pt to ED via POV from home. Pt reports it feels like her throat is closing and it is hard to breath. Pt reports recent change to insulin and started it yesterday. Upon getting out of wheelchair pt reports she feels weak and dizzy.

## 2023-01-21 NOTE — ED Notes (Signed)
Pt c/o of "cramping" and "tightness" in central chest area.EKG printed, MD notified. Pt states her breathing is easier than what it was.

## 2023-01-21 NOTE — Discharge Instructions (Addendum)
You were seen in the emergency department today for concern of allergic reaction.  The rest your laboratory workup was reassuring at this time.  Please follow-up with your primary care provider for ongoing assessment.  Please return to the emergency department for any acute worsening symptoms.

## 2023-01-21 NOTE — ED Notes (Signed)
Discharge instructions reviewed with patient. Patient questions answered and opportunity for education reviewed. Patient voices understanding of discharge instructions with no further questions. Patient ambulatory with steady gait to lobby.  

## 2023-01-24 ENCOUNTER — Telehealth: Payer: Self-pay | Admitting: Neurosurgery

## 2023-01-24 NOTE — Telephone Encounter (Signed)
[facility] request sent to [facility] for MRI done 01/21/23 and xrays from 12/2022

## 2023-01-24 NOTE — Telephone Encounter (Signed)
She had MRI on 01/21/2023 at [facility] and she had her nerve study yesterday at [facility]. Her last appt with Duwayne Heck, patient was going to notify the office once she had these appts done. She states Dr. Myer Haff wanted the results to determine if he would do surgery? Duwayne Heck do you need a telephone visit with the patient?

## 2023-01-25 ENCOUNTER — Ambulatory Visit: Payer: 59

## 2023-01-28 ENCOUNTER — Inpatient Hospital Stay
Admission: RE | Admit: 2023-01-28 | Discharge: 2023-01-28 | Disposition: A | Discharge status: Home / Self Care | Payer: Self-pay | Source: Ambulatory Visit | Attending: Neurosurgery | Admitting: Neurosurgery

## 2023-01-28 ENCOUNTER — Other Ambulatory Visit: Payer: Self-pay

## 2023-01-28 DIAGNOSIS — Z049 Encounter for examination and observation for unspecified reason: Secondary | ICD-10-CM

## 2023-01-28 NOTE — Telephone Encounter (Signed)
I spoke with the patient. She reports she has not had her EMG, only the MRI. She does not want to have the EMG done b/c it is painful, but states she will have it done if it is necessary.

## 2023-01-29 NOTE — Telephone Encounter (Signed)
Patient confirmed 01/31/2023

## 2023-01-31 ENCOUNTER — Ambulatory Visit (INDEPENDENT_AMBULATORY_CARE_PROVIDER_SITE_OTHER): Payer: 59 | Admitting: Neurosurgery

## 2023-01-31 DIAGNOSIS — G609 Hereditary and idiopathic neuropathy, unspecified: Secondary | ICD-10-CM

## 2023-01-31 DIAGNOSIS — G8929 Other chronic pain: Secondary | ICD-10-CM

## 2023-01-31 DIAGNOSIS — M5441 Lumbago with sciatica, right side: Secondary | ICD-10-CM | POA: Diagnosis not present

## 2023-01-31 DIAGNOSIS — M5442 Lumbago with sciatica, left side: Secondary | ICD-10-CM | POA: Diagnosis not present

## 2023-01-31 DIAGNOSIS — G894 Chronic pain syndrome: Secondary | ICD-10-CM

## 2023-01-31 NOTE — Progress Notes (Signed)
Neurosurgery Telephone (Audio-Only) Note  Requesting Provider     No referring provider defined for this encounter. T: N/A F:   Primary Care Provider Care, [facility] [address] [place] [address] T: [phone] F: [phone]  Telehealth visit was conducted with Terri Woods, a 52 y.o. female via telephone.  History of Present Illness: Terri Woods is a 52 year old female presenting today for review of her thoracic MRI.  Her symptoms are unchanged.  12/11/2022 Terri Woods is here today with a chief complaint of low back pain that radiates into the and bilateral hips and legs.    She has been having pain for many years in multiple locations.  She gets shooting pains down her legs.  She has severe back pain that is impacted by walking, standing, bending, and lifting.  Nothing really helps.  Her pain is very severe.  She is in the pain clinic and has been undergoing multimodal pain management for some time.  Nothing has really helped her.    General Review of Systems:  A ROS was performed including pertinent positive and negatives as documented.  All other systems are negative.  Prior to Admission medications   Medication Sig Start Date End Date Taking? Authorizing Provider  albuterol (VENTOLIN HFA) 108 (90 Base) MCG/ACT inhaler Inhale 2 puffs into the lungs every 6 (six) hours as needed for wheezing or shortness of breath. Patient taking differently: Inhale 2 puffs into the lungs every 6 (six) hours as needed for wheezing or shortness of breath. Last used: 6 am. 05/26/21   Rushie Chestnut, PA-C  atorvastatin (LIPITOR) 20 MG tablet Take 40 mg by mouth daily at 6 PM.    [provider]  azelastine (ASTELIN) 0.1 % nasal spray Place into both nostrils 2 (two) times daily as needed for rhinitis. Use in each nostril as directed    [provider]  bimatoprost (LUMIGAN) 0.01 % SOLN Place 1 drop into both eyes at bedtime.    [provider]   brimonidine (ALPHAGAN) 0.2 % ophthalmic solution Place into both eyes in the morning and at bedtime.    [provider]  clonazePAM (KLONOPIN) 1 MG tablet Take 1 mg by mouth 3 (three) times daily as needed.    [provider]  cyclobenzaprine (FLEXERIL) 10 MG tablet Take 10 mg by mouth 3 (three) times daily as needed for muscle spasms.    [provider]  docusate sodium (COLACE) 100 MG capsule Take 100 mg by mouth 2 (two) times daily as needed for mild constipation.    [provider]  DULoxetine (CYMBALTA) 30 MG capsule Take 30 mg by mouth at bedtime. Take with 60 mg for total of 90 mg    [provider]  DULoxetine (CYMBALTA) 60 MG capsule Take 60 mg by mouth at bedtime. Take with 30 mg for total of 90 mg.    [provider]  EPINEPHrine (EPIPEN 2-PAK) 0.3 mg/0.3 mL IJ SOAJ injection Inject 0.3 mg into the muscle as needed for anaphylaxis. 01/21/23   Minna Antis, MD  ergocalciferol (VITAMIN D2) 1.25 MG (50000 UT) capsule Take 50,000 Units by mouth See admin instructions. Take 1 capsule (50000 units) by mouth once a week or once every other week    [provider]  glipiZIDE (GLUCOTROL) 5 MG tablet Take 10 mg by mouth 2 (two) times daily.    [provider]  ibuprofen (ADVIL) 800 MG tablet Take 800 mg by mouth every 8 (  eight) hours as needed (pain.).    [provider]  insulin glargine (LANTUS) 100 UNIT/ML injection Inject 40 Units into the skin at bedtime.    [provider]  ipratropium (ATROVENT) 0.06 % nasal spray Place 2 sprays into both nostrils 4 (four) times daily. 06/01/21   Becky Augusta, NP  ketoconazole (NIZORAL) 2 % shampoo Apply 1 application topically 2 (two) times a week.    [provider]  ketorolac (TORADOL) 10 MG tablet Take 1 tablet (10 mg total) by mouth every 6 (six) hours as needed. 05/27/20   Becky Augusta, NP  lidocaine (LIDODERM) 5 % Place 2 patches onto the skin daily.  Remove & Discard patch within 12 hours or as directed by MD    [provider]  lidocaine (XYLOCAINE) 5 % ointment Apply 1 application topically as needed (pain.).     [provider]  lisinopril (ZESTRIL) 2.5 MG tablet Take 5 mg by mouth every evening.    [provider]  miconazole (MICOTIN) 2 % cream Apply 1 application topically as needed (skin irritation/rash).    [provider]  naloxone [facility]) nasal spray 4 mg/0.1 mL Place 1 spray into the nose as needed (accidental overdose).    [provider]  omeprazole (PRILOSEC) 20 MG capsule Take 20 mg by mouth at bedtime.    [provider]  ondansetron (ZOFRAN ODT) 8 MG disintegrating tablet Take 1 tablet (8 mg total) by mouth every 8 (eight) hours as needed for nausea or vomiting. 01/07/23   Radford Pax, NP  Oxycodone HCl 10 MG TABS Take 10 mg by mouth 3 (three) times daily.    [provider]  oxyCODONE-acetaminophen (PERCOCET/ROXICET) 5-325 MG tablet Take 1 tablet by mouth every 4 (four) hours as needed (pain.).     [provider]  QUEtiapine (SEROQUEL) 300 MG tablet Take 300 mg by mouth at bedtime.    [provider]  SUMAtriptan (IMITREX) 100 MG tablet Take 100 mg by mouth every 2 (two) hours as needed for migraine. May repeat in 2 hours if headache persists or recurs.    [provider]  triamcinolone (NASACORT) 55 MCG/ACT AERO nasal inhaler Place 2 sprays into the nose daily.    [provider]  triamcinolone cream (KENALOG) 0.1 % Apply 1 application topically 2 (two) times daily. 08/31/19   Payton Mccallum, MD  valACYclovir (VALTREX) 1000 MG tablet Take 1 tablet (1,000 mg total) by mouth 3 (three) times daily. 03/13/21   Becky Augusta, NP  VICTOZA 18 MG/3ML SOPN SMARTSIG:0.6 Milligram(s) SUB-Q Daily 02/17/21   [provider]  topiramate (TOPAMAX) 100 MG tablet Take 100 mg by mouth 2 (two) times daily.  01/06/20  [provider]      DATA REVIEWED    Imaging Studies  MRI T spine 01/21/23 Relatively normal-appearing thoracic MRI.  There is no significant canal stenosis.   IMPRESSION  Terri Woods is a 52 y.o. female who I performed a telephone encounter today for evaluation and management of: Chronic lumbosacral complaints   PLAN  Terri Woods is a 52 year old with longstanding lumbosacral complaints.  We discussed her thoracic MRI which is largely normal.  She would like to proceed with evaluation for spinal cord stimulator.  I have sent her a MyChart message with the psychiatry information.  When she lets [place] know where she would like this referral, I will place the order as well as a referral to Dr. Cherylann Ratel for evaluation of a spinal  cord stimulator.  Is encouraged to call our office in the interim with any questions or concerns.  She expressed understanding and was in agreement with this plan.  No orders of the defined types were placed in this encounter.   DISPOSITION  Follow up: In person appointment in  PRN  Susanne Borders, PA   TELEPHONE DOCUMENTATION   This visit was performed via telephone.  Patient location: home Provider location: office  I spent a total of 5 minutes non-face-to-face activities for this visit on the date of this encounter including review of current clinical condition and response to treatment.  The patient is aware of and accepts the limits of this telehealth visit.

## 2023-02-09 ENCOUNTER — Emergency Department
Admission: EM | Admit: 2023-02-09 | Discharge: 2023-02-10 | Disposition: A | Discharge status: Home / Self Care | Payer: 59 | Attending: Emergency Medicine | Admitting: Emergency Medicine

## 2023-02-09 ENCOUNTER — Other Ambulatory Visit: Payer: Self-pay

## 2023-02-09 DIAGNOSIS — T7840XA Allergy, unspecified, initial encounter: Secondary | ICD-10-CM | POA: Diagnosis not present

## 2023-02-09 MED ORDER — EPINEPHRINE 0.3 MG/0.3ML IJ SOAJ
0.3000 mg | INTRAMUSCULAR | 1 refills | Status: AC | PRN
Start: 2023-02-09 — End: ?

## 2023-02-09 MED ORDER — METHYLPREDNISOLONE SODIUM SUCC 125 MG IJ SOLR
125.0000 mg | Freq: Once | INTRAMUSCULAR | Status: AC
  Administered 2023-02-09: 125 mg via INTRAVENOUS
  Filled 2023-02-09: qty 2

## 2023-02-09 MED ORDER — PREDNISONE 20 MG PO TABS: 40.0000 mg | ORAL_TABLET | Freq: Every day | ORAL | 0 refills | Status: AC

## 2023-02-09 MED ORDER — SODIUM CHLORIDE 0.9 % IV BOLUS
1000.0000 mL | Freq: Once | INTRAVENOUS | Status: AC
  Administered 2023-02-09: 1000 mL via INTRAVENOUS

## 2023-02-09 MED ORDER — OXYCODONE-ACETAMINOPHEN 5-325 MG PO TABS
1.0000 | ORAL_TABLET | Freq: Once | ORAL | Status: AC
  Administered 2023-02-09: 1 via ORAL
  Filled 2023-02-09: qty 1

## 2023-02-09 MED ORDER — FAMOTIDINE IN NACL 20-0.9 MG/50ML-% IV SOLN
20.0000 mg | Freq: Once | INTRAVENOUS | Status: AC
  Administered 2023-02-09: 20 mg via INTRAVENOUS
  Filled 2023-02-09: qty 50

## 2023-02-09 NOTE — Discharge Instructions (Addendum)
Please seek medical attention for any high fevers, chest pain, shortness of breath, change in behavior, persistent vomiting, bloody stool or any other new or concerning symptoms.  

## 2023-02-09 NOTE — ED Triage Notes (Signed)
Pt arrives from home via [facility]. C/O allergic reaction with face and throat swelling, itching and hives. Took Epi pen and 25 Benadryl PO at home prior to EMS arrival.  EMS gave 50 Benadryl, 0.5 Epi, and 4 Zofran.  Swelling improved, pt reports itching "all over" and throat feels "hard to swallow."  Speaking in complete sentences. No SHOB.  NAD upon arrival.

## 2023-02-09 NOTE — ED Provider Notes (Signed)
[facility] Provider Note    Event Date/Time   First MD Initiated Contact with Patient 02/09/23 2146     (approximate)   History   Allergic Reaction   HPI  Terri Woods is a 52 y.o. female who presents to the emergency department today because of concerns for allergic reaction.  The patient states that her symptoms started today.  She is unclear what might have caused the allergies.  She had diffuse hives and felt like her throat was tightening.  She did take an epipen at home.  Additionally EMS gave another EpiPen as well as Benadryl.  Per chart review patient had similar presentation a few weeks ago.     Physical Exam   Triage Vital Signs: ED Triage Vitals  Encounter Vitals Group     BP 02/09/23 2146 136/81     Systolic BP Percentile --      Diastolic BP Percentile --      Pulse Rate 02/09/23 2146 99     Resp 02/09/23 2146 19     Temp 02/09/23 2146 98.2 F (36.8 C)     Temp Source 02/09/23 2146 Oral     SpO2 02/09/23 2146 92 %     Weight 02/09/23 2147 172 lb 8 oz (78.2 kg)     Height 02/09/23 2147 5\' 1"  (1.549 m)     Head Circumference --      Peak Flow --      Pain Score 02/09/23 2147 10     Pain Loc --      Pain Education --      Exclude from Growth Chart --     Most recent vital signs: Vitals:   02/09/23 2146  BP: 136/81  Pulse: 99  Resp: 19  Temp: 98.2 F (36.8 C)  SpO2: 92%   General: Awake, alert, oriented. CV:  Good peripheral perfusion. Regular rate and rhythm. Resp:  Normal effort. Lungs clear. No stridor.  Abd:  No distention.    ED Results / Procedures / Treatments   Labs (all labs ordered are listed, but only abnormal results are displayed) Labs Reviewed - No data to display   EKG  I, Phineas Semen, attending physician, personally viewed and interpreted this EKG  EKG Time: 2150 Rate: 99 Rhythm: sinus rhythm Axis: normal Intervals: qtc 498 QRS: narrow ST changes: no st elevation Impression: normal  ekg    RADIOLOGY None   PROCEDURES:  Critical Care performed: No  MEDICATIONS ORDERED IN ED: Medications - No data to display   IMPRESSION / MDM / ASSESSMENT AND PLAN / ED COURSE  I reviewed the triage vital signs and the nursing notes.                              Differential diagnosis includes, but is not limited to, allergic reaction, anaphylaxis  Patient's presentation is most consistent with acute presentation with potential threat to life or bodily function.   The patient is on the cardiac monitor to evaluate for evidence of arrhythmia and/or significant heart rate changes.  Patient presented to the emergency department today because of concerns for allergic reaction.  Patient had received epinephrine prior to arrival to the emergency department.  Also had received Benadryl.  Will give Solu-Medrol and Pepcid.  Will plan on observing.  Discussed plan with patient.      FINAL CLINICAL IMPRESSION(S) / ED DIAGNOSES   Final diagnoses:  Allergic  reaction, initial encounter        Note:  This document was prepared using Dragon voice recognition software and may include unintentional dictation errors.    Phineas Semen, MD 02/09/23 2312

## 2023-02-10 MED ORDER — ACETAMINOPHEN 325 MG PO TABS: 650.0000 mg | ORAL_TABLET | Freq: Once | ORAL | Status: DC

## 2023-02-18 NOTE — Telephone Encounter (Signed)
Patient called today to let our office know that she would like her referral sent to either [facility], Dr. Mindi Slicker, or Dr. Orie Fisherman as they offer telehealth visits. She would like to see whomever she can get in soonest with.

## 2023-02-18 NOTE — Telephone Encounter (Signed)
Referral has been placed for the psych eval and to Dr Cherylann Ratel. She has already had an MRI thoracic spine.

## 2023-02-18 NOTE — Telephone Encounter (Signed)
A referral needs to be placed please.

## 2023-02-18 NOTE — Telephone Encounter (Signed)
Both referrals have been sent.

## 2023-03-19 ENCOUNTER — Encounter: Payer: Self-pay | Admitting: Student in an Organized Health Care Education/Training Program

## 2023-03-19 ENCOUNTER — Ambulatory Visit
Admission: RE | Admit: 2023-03-19 | Discharge: 2023-03-19 | Disposition: A | Discharge status: Home / Self Care | Payer: 59 | Source: Ambulatory Visit | Attending: Student in an Organized Health Care Education/Training Program | Admitting: Student in an Organized Health Care Education/Training Program

## 2023-03-19 ENCOUNTER — Ambulatory Visit
Payer: 59 | Attending: Student in an Organized Health Care Education/Training Program | Admitting: Student in an Organized Health Care Education/Training Program

## 2023-03-19 VITALS — BP 163/85 | HR 78 | Temp 97.7°F | Resp 18 | Ht 61.0 in | Wt 168.0 lb

## 2023-03-19 DIAGNOSIS — G894 Chronic pain syndrome: Secondary | ICD-10-CM | POA: Diagnosis not present

## 2023-03-19 DIAGNOSIS — M5416 Radiculopathy, lumbar region: Secondary | ICD-10-CM | POA: Diagnosis not present

## 2023-03-19 DIAGNOSIS — M47816 Spondylosis without myelopathy or radiculopathy, lumbar region: Secondary | ICD-10-CM | POA: Insufficient documentation

## 2023-03-19 DIAGNOSIS — M51362 Other intervertebral disc degeneration, lumbar region with discogenic back pain and lower extremity pain: Secondary | ICD-10-CM | POA: Insufficient documentation

## 2023-03-19 DIAGNOSIS — G8929 Other chronic pain: Secondary | ICD-10-CM | POA: Insufficient documentation

## 2023-03-19 NOTE — Patient Instructions (Signed)
Procedure instructions  Do not eat or drink fluids (other than water) for 6 hours before your procedure  No water for 2 hours before your procedure  Take your blood pressure medicine with a sip of water  Arrive 30 minutes before your appointment  Carefully read the "Preparing for your procedure" detailed instructions  If you have questions call us at [phone]  _____________________________________________________________________    ______________________________________________________________________  Preparing for your procedure  Appointments: If you think you may not be able to keep your appointment, call 24-48 hours in advance to cancel. We need time to make it available to others.  During your procedure appointment there will be: No Prescription Refills. No disability issues to discussed. No medication changes or discussions.  Instructions: Food intake: Avoid eating anything solid for at least 8 hours prior to your procedure. Clear liquid intake: You may take clear liquids such as water up to 2 hours prior to your procedure. (No carbonated drinks. No soda.) Transportation: Unless otherwise stated by your physician, bring a driver. Morning Medicines: Except for blood thinners, take all of your other morning medications with a sip of water. Make sure to take your heart and blood pressure medicines. If your blood pressure's lower number is above 100, the case will be rescheduled. Blood thinners: Make sure to stop your blood thinners as instructed.  If you take a blood thinner, but were not instructed to stop it, call our office [phone] and ask to talk to a nurse. Not stopping a blood thinner prior to certain procedures could lead to serious complications. Diabetics on insulin: Notify the staff so that you can be scheduled 1st case in the morning. If your diabetes requires high dose insulin, take only  of your normal insulin dose the morning of the procedure and  notify the staff that you have done so. Preventing infections: Shower with an antibacterial soap the morning of your procedure.  Build-up your immune system: Take 1000 mg of Vitamin C with every meal (3 times a day) the day prior to your procedure. Antibiotics: Inform the nursing staff if you are taking any antibiotics or if you have any conditions that may require antibiotics prior to procedures. (Example: recent joint implants)   Pregnancy: If you are pregnant make sure to notify the nursing staff. Not doing so may result in injury to the fetus, including death.  Sickness: If you have a cold, fever, or any active infections, call and cancel or reschedule your procedure. Receiving steroids while having an infection may result in complications. Arrival: You must be in the facility at least 30 minutes prior to your scheduled procedure. Tardiness: Your scheduled time is also the cutoff time. If you do not arrive at least 15 minutes prior to your procedure, you will be rescheduled.  Children: Do not bring any children with you. Make arrangements to keep them home. Dress appropriately: There is always a possibility that your clothing may get soiled. Avoid long dresses. Valuables: Do not bring any jewelry or valuables.  Reasons to call and reschedule or cancel your procedure: (Following these recommendations will minimize the risk of a serious complication.) Surgeries: Avoid having procedures within 2 weeks of any surgery. (Avoid for 2 weeks before or after any surgery). Flu Shots: Avoid having procedures within 2 weeks of a flu shots or . (Avoid for 2 weeks before or after immunizations). Barium: Avoid having a procedure within 7-10 days after having had a radiological study involving the use of radiological contrast. (  Myelograms, Barium swallow or enema study). Heart attacks: Avoid any elective procedures or surgeries for the initial 6 months after a "Myocardial Infarction" (Heart Attack). Blood  thinners: It is imperative that you stop these medications before procedures. Let us know if you if you take any blood thinner.  Infection: Avoid procedures during or within two weeks of an infection (including chest colds or gastrointestinal problems). Symptoms associated with infections include: Localized redness, fever, chills, night sweats or profuse sweating, burning sensation when voiding, cough, congestion, stuffiness, runny nose, sore throat, diarrhea, nausea, vomiting, cold or Flu symptoms, recent or current infections. It is specially important if the infection is over the area that we intend to treat. Heart and lung problems: Symptoms that may suggest an active cardiopulmonary problem include: cough, chest pain, breathing difficulties or shortness of breath, dizziness, ankle swelling, uncontrolled high or unusually low blood pressure, and/or palpitations. If you are experiencing any of these symptoms, cancel your procedure and contact your primary care physician for an evaluation.  Remember:  Regular Business hours are:  Monday to Thursday 8:00 AM to 4:00 PM  Provider's Schedule: Francisco Naveira, MD:  Procedure days: Tuesday and Thursday 7:30 AM to 4:00 PM  Bilal Lateef, MD:  Procedure days: Monday and Wednesday 7:30 AM to 4:00 PM 

## 2023-03-19 NOTE — Progress Notes (Signed)
Safety precautions to be maintained throughout the outpatient stay will include: orient to surroundings, keep bed in low position, maintain call bell within reach at all times, provide assistance with transfer out of bed and ambulation.  

## 2023-03-19 NOTE — Progress Notes (Signed)
Patient: Terri Woods  Service Category: E/M  Provider: Edward Jolly, MD  DOB: [birth year]  DOS: 03/19/2023  Referring Provider: Susanne Borders, PA  MRN: [id number]  Setting: Ambulatory outpatient  PCP: [facility]  Type: New Patient  Specialty: Interventional Pain Management    Location: Office  Delivery: Face-to-face     Primary Reason(s) for Visit: Encounter for initial evaluation of one or more chronic problems (new to examiner) potentially causing chronic pain, and posing a threat to normal musculoskeletal function. (Level of risk: High) CC: Back Pain  HPI  Ms. Terri Woods is a 52 y.o. year old, female patient, who comes for the first time to our practice referred by Susanne Borders, PA for our initial evaluation of her chronic pain. She has Chronic radicular lumbar pain; Lumbar spondylosis; Degeneration of intervertebral disc of lumbar region with discogenic back pain and lower extremity pain; and Chronic pain syndrome on their problem list. Today she comes in for evaluation of her Back Pain  Pain Assessment: Location: Lower, Right, Left Back Radiating: Radiates from lower back down to hips and legs bilateral to feet Onset: More than a month ago Duration: Chronic pain Quality: Aching, Constant, Burning, Sharp, Shooting, Tender, Throbbing, Numbness Severity: 8 /10 (subjective, self-reported pain score)  Effect on ADL: Limits ADLs Timing: Constant Modifying factors: Denies BP: (!) 163/85  HR: 78  Onset and Duration: Gradual and Present longer than 3 months Cause of pain: Unknown Severity: Getting worse, NAS-11 at its worse: 10/10, NAS-11 at its best: 7/10, NAS-11 now: 8/10, and NAS-11 on the average: 9/10 Timing: Morning, Afternoon, Night, After activity or exercise, and After a period of immobility Aggravating Factors: Bowel movements, Climbing, Eating, Intercourse (sex), Kneeling, Lifiting, Prolonged sitting, Prolonged standing, Squatting, Stooping , Surgery made it  worse, Twisting, Walking, Walking uphill, Walking downhill, and Working Alleviating Factors: Hot packs, Lying down, Using a brace, and Chiropractic manipulations Associated Problems: Color changes, Day-time cramps, Night-time cramps, Dizziness, Fatigue, Pain that wakes patient up, and Pain that does not allow patient to sleep Quality of Pain: Aching, Annoying, Burning, Exhausting, Sharp, Shooting, Sickening, Tender, and Throbbing Previous Examinations or Tests: Biopsy, CT scan, Ct-Myelogram, and MRI scan Previous Treatments: Biofeedback, Epidural steroid injections, Facet blocks, Narcotic medications, Physical Therapy, Radiofrequency, Relaxation therapy, Steroid treatments by mouth, Strengthening exercises, Stretching exercises, TENS, and Trigger point injections  [name] is being evaluated for possible interventional pain management therapies for the treatment of her chronic pain.   Terri Woods is a 52 year old female who presents with a chief complaint of low back pain which radiates down bilateral hips into both legs and bilateral feet.  This has been going on for many years.  Of note, she is established at the [facility] pain management clinic.  They manage her medications which include OxyContin 10 mg 3 times a day as well as oxycodone 5 mg every 6-8 hours.  She is also on Klonopin 2 mg twice daily.  She has been referred here from Dr. Marcell Barlow and neurosurgery to discuss spinal cord stimulator trial.  She states that she has done physical therapy in the past along with acupuncture, chiropractic therapy.  She is on multimodal analgesics which is managed by [facility] pain management clinic.  She was very clear with me that she is only here to discuss spinal cord stimulation and that she will continue medication management with [facility].  From an interventional standpoint, she has had trigger point injections, lumbar epidural steroid injections as well as lumbar radiofrequency ablations of  the lumbar facet joints.  She  states that these do not provide her with any significant, long-lasting benefit.  She is accompanied today by her husband.  Historic Controlled Substance Pharmacotherapy Review  PMP and historical list of controlled substances:  03/02/2023 01/01/2023  2 Oxycodone-Acetaminophen 5-325 105.00 26 Ja Tho [id number] [facility] ([id number]) 0/0 30.29 MME Medicare [place]  03/02/2023 01/01/2023  2 Oxycontin Er 10 Mg Tablet 90.00 30 Ja Tho [id number] [facility] ([id number]) 0/0 45.00 MME Medicare [place]  02/27/2023 02/11/2023  2 Clonazepam 2 Mg Tablet 60.00 30 Ha Su [id number] [facility] ([id number]) 0/3 8.00 LME Medicare [place]  Total MME equals 75.29   Historical Monitoring: The patient  reports current drug use. Drug: Oxycodone. List of prior UDS Testing: Lab Results  Component Value Date   MDMA NEGATIVE 09/05/2011   COCAINSCRNUR NEGATIVE 09/05/2011   PCPSCRNUR NEGATIVE 09/05/2011   THCU NEGATIVE 09/05/2011   Historical Background Evaluation: [place] PMP: PDMP not reviewed this encounter. Review of the past 64-months conducted.             PMP NARX Score Report:  Narcotic: 592 Sedative: 571 Stimulant: 0 [place] Department of public safety, offender search: Engineer, mining Information) Non-contributory Risk Assessment Profile: Aberrant behavior: None observed or detected today Risk factors for fatal opioid overdose: age 39-14 years old and concomitant use of Benzodiazepines Fatal overdose hazard ratio (HR): 1.92 for 50-99 MME/day Non-fatal overdose hazard ratio (HR): 3.73 for 50-99 MME/day Risk of opioid abuse or dependence: 0.7-3.0% with doses <= 36 MME/day and 6.1-26% with doses >= 120 MME/day. Substance use disorder (SUD) risk level: See below Personal History of Substance Abuse (SUD-Substance use disorder):  Alcohol: Negative  Illegal Drugs: Negative  Rx Drugs: Negative  ORT Risk Level calculation: Low Risk  Opioid Risk Tool - 03/19/23 1132       Family History of Substance Abuse   Alcohol Positive Female   father   Illegal Drugs Negative    Rx Drugs  Negative      Personal History of Substance Abuse   Alcohol Negative    Illegal Drugs Negative    Rx Drugs Negative      Age   Age between 16-45 years  No      Psychological Disease   Psychological Disease Negative    Depression Positive      Total Score   Opioid Risk Tool Scoring 2    Opioid Risk Interpretation Low Risk            ORT Scoring interpretation table:  Score <3 = Low Risk for SUD  Score between 4-7 = Moderate Risk for SUD  Score >8 = High Risk for Opioid Abuse   PHQ-2 Depression Scale:  Total score: 0  PHQ-2 Scoring interpretation table: (Score and probability of major depressive disorder)  Score 0 = No depression  Score 1 = 15.4% Probability  Score 2 = 21.1% Probability  Score 3 = 38.4% Probability  Score 4 = 45.5% Probability  Score 5 = 56.4% Probability  Score 6 = 78.6% Probability   PHQ-9 Depression Scale:  Total score: 0  PHQ-9 Scoring interpretation table:  Score 0-4 = No depression  Score 5-9 = Mild depression  Score 10-14 = Moderate depression  Score 15-19 = Moderately severe depression  Score 20-27 = Severe depression (2.4 times higher risk of SUD and 2.89 times higher risk of overuse)   Pharmacologic Plan:  Medication management to continue with [facility] pain management.  Initial impression:  Eval for spinal cord stimulator trial only  Meds   Current Outpatient Medications:    albuterol (VENTOLIN HFA) 108 (90 Base) MCG/ACT inhaler, Inhale 2 puffs into the lungs every 6 (six) hours as needed for wheezing or shortness of breath. (Patient taking differently: Inhale 2 puffs into the lungs every 6 (six) hours as needed for wheezing or shortness of breath. Last used: 6 am.), Disp: 8 each, Rfl: 0   atorvastatin (LIPITOR) 20 MG tablet, Take 40 mg by mouth daily at 6 PM., Disp: , Rfl:    azelastine (ASTELIN) 0.1 % nasal spray, Place into both nostrils 2 (two) times daily as needed for rhinitis. Use in each nostril as directed, Disp: , Rfl:     bimatoprost (LUMIGAN) 0.01 % SOLN, Place 1 drop into both eyes at bedtime., Disp: , Rfl:    brimonidine (ALPHAGAN) 0.2 % ophthalmic solution, Place into both eyes in the morning and at bedtime., Disp: , Rfl:    clonazePAM (KLONOPIN) 1 MG tablet, Take 1 mg by mouth 3 (three) times daily as needed., Disp: , Rfl:    cyclobenzaprine (FLEXERIL) 10 MG tablet, Take 10 mg by mouth 3 (three) times daily as needed for muscle spasms., Disp: , Rfl:    docusate sodium (COLACE) 100 MG capsule, Take 100 mg by mouth 2 (two) times daily as needed for mild constipation., Disp: , Rfl:    DULoxetine (CYMBALTA) 30 MG capsule, Take 30 mg by mouth at bedtime. Take with 60 mg for total of 90 mg, Disp: , Rfl:    DULoxetine (CYMBALTA) 60 MG capsule, Take 60 mg by mouth at bedtime. Take with 30 mg for total of 90 mg., Disp: , Rfl:    EPINEPHrine (EPIPEN 2-PAK) 0.3 mg/0.3 mL IJ SOAJ injection, Inject 0.3 mg into the muscle as needed for anaphylaxis., Disp: 1 each, Rfl: 1   EPINEPHrine (EPIPEN 2-PAK) 0.3 mg/0.3 mL IJ SOAJ injection, Inject 0.3 mg into the muscle as needed., Disp: 1 each, Rfl: 1   ergocalciferol (VITAMIN D2) 1.25 MG (50000 UT) capsule, Take 50,000 Units by mouth See admin instructions. Take 1 capsule (50000 units) by mouth once a week or once every other week, Disp: , Rfl:    glipiZIDE (GLUCOTROL) 5 MG tablet, Take 10 mg by mouth 2 (two) times daily., Disp: , Rfl:    ibuprofen (ADVIL) 800 MG tablet, Take 800 mg by mouth every 8 (eight) hours as needed (pain.)., Disp: , Rfl:    insulin glargine (LANTUS) 100 UNIT/ML injection, Inject 40 Units into the skin at bedtime., Disp: , Rfl:    ipratropium (ATROVENT) 0.06 % nasal spray, Place 2 sprays into both nostrils 4 (four) times daily., Disp: 15 mL, Rfl: 12   ketoconazole (NIZORAL) 2 % shampoo, Apply 1 application topically 2 (two) times a week., Disp: , Rfl:    ketorolac (TORADOL) 10 MG tablet, Take 1 tablet (10 mg total) by mouth every 6 (six) hours as needed.,  Disp: 20 tablet, Rfl: 0   lidocaine (LIDODERM) 5 %, Place 2 patches onto the skin daily. Remove & Discard patch within 12 hours or as directed by MD, Disp: , Rfl:    lidocaine (XYLOCAINE) 5 % ointment, Apply 1 application topically as needed (pain.). , Disp: , Rfl:    lisinopril (ZESTRIL) 2.5 MG tablet, Take 5 mg by mouth every evening., Disp: , Rfl:    naloxone (NARCAN) nasal spray 4 mg/0.1 mL, Place 1 spray into the nose as needed (accidental overdose).,  Disp: , Rfl:    omeprazole (PRILOSEC) 20 MG capsule, Take 20 mg by mouth at bedtime., Disp: , Rfl:    ondansetron (ZOFRAN ODT) 8 MG disintegrating tablet, Take 1 tablet (8 mg total) by mouth every 8 (eight) hours as needed for nausea or vomiting., Disp: 20 tablet, Rfl: 0   Oxycodone HCl 10 MG TABS, Take 10 mg by mouth 3 (three) times daily., Disp: , Rfl:    oxyCODONE-acetaminophen (PERCOCET/ROXICET) 5-325 MG tablet, Take 1 tablet by mouth every 4 (four) hours as needed (pain.). , Disp: , Rfl:    QUEtiapine (SEROQUEL) 300 MG tablet, Take 300 mg by mouth at bedtime., Disp: , Rfl:    SUMAtriptan (IMITREX) 100 MG tablet, Take 100 mg by mouth every 2 (two) hours as needed for migraine. May repeat in 2 hours if headache persists or recurs., Disp: , Rfl:    triamcinolone (NASACORT) 55 MCG/ACT AERO nasal inhaler, Place 2 sprays into the nose daily., Disp: , Rfl:    triamcinolone cream (KENALOG) 0.1 %, Apply 1 application topically 2 (two) times daily., Disp: 30 g, Rfl: 0   valACYclovir (VALTREX) 1000 MG tablet, Take 1 tablet (1,000 mg total) by mouth 3 (three) times daily., Disp: 21 tablet, Rfl: 0   VICTOZA 18 MG/3ML SOPN, SMARTSIG:0.6 Milligram(s) SUB-Q Daily, Disp: , Rfl:    miconazole (MICOTIN) 2 % cream, Apply 1 application topically as needed (skin irritation/rash). (Patient not taking: Reported on 03/19/2023), Disp: , Rfl:   Imaging Review  Cervical Imaging: Cervical MR wo contrast: Results for orders placed during the hospital encounter of  07/29/17  MR CERVICAL SPINE WO CONTRAST  Narrative CLINICAL DATA:  Cervical radiculopathy. Neck and RIGHT arm pain. Larey Seat off horse February 18.  EXAM: MRI CERVICAL SPINE WITHOUT CONTRAST  TECHNIQUE: Multiplanar, multisequence MR imaging of the cervical spine was performed. No intravenous contrast was administered.  COMPARISON:  CT of the cervical spine 08/12/2014.  FINDINGS: Alignment: Straightening of the normal cervical lordosis. No subluxation.  Vertebrae: No fracture, evidence of discitis, or bone lesion.  Cord: Normal signal and morphology.  Posterior Fossa, vertebral arteries, paraspinal tissues: Negative.  Disc levels:  No disc protrusion or spinal stenosis. No foraminal narrowing. No evidence for facet arthropathy.  Correlating with the previous CT cervical spine, there is good general agreement.  IMPRESSION: Negative exam. No disc protrusion or spinal stenosis. No occult osseous or ligamentous injury is observed. No cause of the reported symptoms can be identified.   Electronically Signed By: Elsie Stain M.D. On: 07/29/2017 13:16   Narrative CLINICAL DATA:  Larey Seat from horse today.  Neck pain  EXAM: CERVICAL SPINE - COMPLETE 4+ VIEW  COMPARISON:  None.  FINDINGS: There is no evidence of cervical spine fracture or prevertebral soft tissue swelling. Alignment is normal. No other significant bone abnormalities are identified.  IMPRESSION: Negative cervical spine radiographs.   Electronically Signed By: Paulina Fusi M.D. On: 03/02/2020 14:15  DG Shoulder Left  Narrative CLINICAL DATA:  Left shoulder and clavicle pain after being thrown from a horse today.  EXAM: LEFT SHOULDER - 2+ VIEW  COMPARISON:  Left clavicle radiographs 03/02/2020.  FINDINGS: There is an inferiorly and medially displaced transverse fracture through the mid left clavicle. There is no evidence of proximal humeral fracture or dislocation. The left clavicle  appears intact. Mild acromioclavicular degenerative changes are present.  IMPRESSION: Displaced transverse fracture of the mid left clavicle.   Electronically Signed By: Carey Bullocks M.D. On: 05/27/2020 16:29  TMR LUMBAR SPINE WO CONTRAST  Narrative CLINICAL DATA:  Low back pain radiating into the legs with burning and stinging in both feet, worse on the right, for 3 years. No known injury.  EXAM: MRI LUMBAR SPINE WITHOUT CONTRAST  TECHNIQUE: Multiplanar, multisequence MR imaging of the lumbar spine was performed. No intravenous contrast was administered.  COMPARISON:  Lumbar MRI 05/22/2018.  FINDINGS: Segmentation: Conventional anatomy assumed, with the last open disc space designated L5-S1.Concordant with prior imaging.  Alignment:  Physiologic.  Vertebrae: No worrisome osseous lesion, acute fracture or pars defect. The visualized sacroiliac joints appear unremarkable.  Conus medullaris: Extends to the T12-L1 level and appears normal.  Paraspinal and other soft tissues: No significant paraspinal findings.  Disc levels:  Sagittal images demonstrate no significant disc space findings within the visualized lower thoracic spine.  L1-2: Normal interspace.  L2-3: Normal interspace.  L3-4: Preserved disc height and hydration. Mild disc bulging and mild facet hypertrophy. No spinal stenosis or nerve root encroachment.  L4-5: Preserved disc height and hydration. Mild disc bulging and mild facet hypertrophy. No spinal stenosis or nerve root encroachment.  L5-S1: Preserved disc height. Previously demonstrated broad-based left subarticular zone disc protrusion has largely involuted. No residual spinal stenosis or definite nerve root encroachment.  IMPRESSION: 1. No acute findings or explanation for the patient's symptoms. 2. Mild disc bulging and facet hypertrophy at L3-4 and L4-5 without resulting spinal stenosis or nerve root encroachment. 3. Previously  demonstrated left subarticular zone disc protrusion at L5-S1 has largely involuted.   Electronically Signed By: Carey Bullocks M.D. On: 11/23/2022 14:05  DG HIP UNILAT WITH PELVIS 2-3 VIEWS LEFT  Narrative CLINICAL DATA:  Left groin pain.  EXAM: DG HIP (WITH OR WITHOUT PELVIS) 2-3V LEFT  COMPARISON:  None.  FINDINGS: Bones of the left hip appear normal. Pelvic bones appear normal. Bowel gas pattern is normal. No fecal impaction.  IMPRESSION: Normal left hip.   Electronically Signed By: Francene Boyers M.D. On: 11/27/2017 15:43  DG Knee Complete 4 Views Right  Narrative CLINICAL DATA:  Larey Seat from horse today.  EXAM: RIGHT KNEE - COMPLETE 4+ VIEW  COMPARISON:  None.  FINDINGS: No evidence of fracture, dislocation, or joint effusion. No evidence of arthropathy or other focal bone abnormality. Soft tissues are unremarkable.  IMPRESSION: Negative.   Electronically Signed By: Paulina Fusi M.D. On: 03/02/2020 14:15    DG Knee Complete 4 Views Left  Narrative CLINICAL DATA:  Larey Seat from horse today.  EXAM: LEFT KNEE - COMPLETE 4+ VIEW  COMPARISON:  None.  FINDINGS: No evidence of fracture, dislocation, or joint effusion. No evidence of arthropathy or other focal bone abnormality. Soft tissues are unremarkable.  IMPRESSION: Negative.   Electronically Signed By: Paulina Fusi M.D. On: 03/02/2020 14:14   Foot Imaging: Foot-R DG Complete: Results for orders placed during the hospital encounter of 09/20/14  DG Foot Complete Right  Narrative CLINICAL DATA:  Pain and bruising laterally after kicking water coolor 1 day prior  EXAM: RIGHT FOOT COMPLETE - 3+ VIEW  COMPARISON:  November 23, 2007  FINDINGS: Frontal, oblique, and lateral views were obtained. There is minimal avulsion along the medial proximal aspect of the fifth proximal phalanx. No other evidence of fracture. No dislocation. Joint spaces appear intact. No erosive  change.  IMPRESSION: Tiny avulsion along the medial proximal aspect of the fifth proximal phalanx. No other evidence of fracture. No dislocation. No appreciable arthropathy.   Electronically Signed By: Bretta Bang III M.D.  Narrative CLINICAL DATA:  Pain following fall  EXAM: LEFT WRIST - COMPLETE 3+ VIEW  COMPARISON:  December 16, 2012  FINDINGS: Frontal, oblique, lateral, and ulnar deviation scaphoid images were obtained. There is no fracture or dislocation. The joint spaces appear normal. No erosive change. The pronator quadratus fat pad is not elevated.  IMPRESSION: No fracture or dislocation.  No apparent arthropathy.   Electronically Signed By: Bretta Bang III M.D. On: 10/10/2016 11:26   Narrative CLINICAL DATA:  Pain following fall  EXAM: LEFT HAND - COMPLETE 3+ VIEW  COMPARISON:  November 27, 2004  FINDINGS: Frontal, oblique, and lateral views were obtained. There is no fracture or dislocation. Joint spaces appear normal. No erosive change.  IMPRESSION: No fracture or dislocation.  No apparent arthropathy.   Electronically Signed By: Bretta Bang III M.D. On: 10/10/2016 11:25   Complexity Note: Imaging results reviewed.                         ROS  Cardiovascular: No reported cardiovascular signs or symptoms such as High blood pressure, coronary artery disease, abnormal heart rate or rhythm, heart attack, blood thinner therapy or heart weakness and/or failure Pulmonary or Respiratory: No reported pulmonary signs or symptoms such as wheezing and difficulty taking a deep full breath (Asthma), difficulty blowing air out (Emphysema), coughing up mucus (Bronchitis), persistent dry cough, or temporary stoppage of breathing during sleep Neurological: Curved spine Psychological-Psychiatric: No reported psychological or psychiatric signs or symptoms such as difficulty sleeping, anxiety, depression, delusions or hallucinations (schizophrenial),  mood swings (bipolar disorders) or suicidal ideations or attempts Gastrointestinal: Alternating episodes iof diarrhea and constipation (IBS-Irritable bowe syndrome) Genitourinary: No reported renal or genitourinary signs or symptoms such as difficulty voiding or producing urine, peeing blood, non-functioning kidney, kidney stones, difficulty emptying the bladder, difficulty controlling the flow of urine, or chronic kidney disease Hematological: No reported hematological signs or symptoms such as prolonged bleeding, low or poor functioning platelets, bruising or bleeding easily, hereditary bleeding problems, low energy levels due to low hemoglobin or being anemic Endocrine: High blood sugar requiring insulin (IDDM) and High blood sugar controlled without the use of insulin (NIDDM) Rheumatologic: Joint aches and or swelling due to excess weight (Osteoarthritis) and Generalized muscle aches (Fibromyalgia) Musculoskeletal: Negative for myasthenia gravis, muscular dystrophy, multiple sclerosis or malignant hyperthermia Work History: Disabled  Allergies  Ms. Petro is allergic to exenatide, levodopa, lyrica [pregabalin], other, rotigotine, erythromycin, bee venom, fentanyl, hysingla er [hydrocodone bitartrate er], meloxicam, doxycycline, morphine, penicillins, semaglutide, and shellfish allergy.  Laboratory Chemistry Profile   Renal Lab Results  Component Value Date   BUN 27 (H) 01/21/2023   CREATININE 0.91 01/21/2023   GFRAA >60 01/28/2019   GFRNONAA >60 01/21/2023   PROTEINUR TRACE (A) 09/20/2022     Electrolytes Lab Results  Component Value Date   NA 137 01/21/2023   K 4.1 01/21/2023   CL 102 01/21/2023   CALCIUM 9.1 01/21/2023     Hepatic Lab Results  Component Value Date   AST 53 (H) 05/08/2022   ALT 62 (H) 05/08/2022   ALBUMIN 3.8 05/08/2022   ALKPHOS 85 05/08/2022   LIPASE 31 05/14/2020     ID Lab Results  Component Value Date   SARSCOV2NAA NEGATIVE 04/06/2022    PREGTESTUR NEGATIVE 03/10/2020     Bone Lab Results  Component Value Date   VD25OH 15.8 (L) 12/21/2015   VD125OH2TOT 40.5 12/21/2015     Endocrine Lab Results  Component Value Date   GLUCOSE 214 (H) 01/21/2023   GLUCOSEU NEGATIVE 09/20/2022     Neuropathy No results found for: "VITAMINB12", "FOLATE", "HGBA1C", "HIV"   CNS No results found for: "COLORCSF", "APPEARCSF", "RBCCOUNTCSF", "WBCCSF", "POLYSCSF", "LYMPHSCSF", "EOSCSF", "PROTEINCSF", "GLUCCSF", "JCVIRUS", "CSFOLI", "IGGCSF", "LABACHR", "ACETBL"   Inflammation (CRP: Acute  ESR: Chronic) Lab Results  Component Value Date   CRP 0.8 07/18/2020   ESRSEDRATE 28 07/18/2020     Rheumatology No results found for: "RF", "ANA", "LABURIC", "URICUR", "LYMEIGGIGMAB", "LYMEABIGMQN", "HLAB27"   Coagulation Lab Results  Component Value Date   INR 0.9 11/05/2011   LABPROT 12.5 11/05/2011   PLT 165 01/21/2023     Cardiovascular Lab Results  Component Value Date   TROPONINI < 0.02 11/06/2011   HGB 12.2 01/21/2023   HCT 37.0 01/21/2023     Screening Lab Results  Component Value Date   SARSCOV2NAA NEGATIVE 04/06/2022   PREGTESTUR NEGATIVE 03/10/2020     Cancer No results found for: "CEA", "CA125", "LABCA2"   Allergens No results found for: "ALMOND", "APPLE", "ASPARAGUS", "AVOCADO", "BANANA", "BARLEY", "BASIL", "BAYLEAF", "GREENBEAN", "LIMABEAN", "WHITEBEAN", "BEEFIGE", "REDBEET", "BLUEBERRY", "BROCCOLI", "CABBAGE", "MELON", "CARROT", "CASEIN", "CASHEWNUT", "CAULIFLOWER", "CELERY"     Note: Lab results reviewed.  PFSH  Drug: Ms. Seibold  reports current drug use. Drug: Oxycodone. Alcohol:  reports no history of alcohol use. Tobacco:  reports that she has never smoked. She has never used smokeless tobacco. Medical:  has a past medical history of Arthritis, Asthma, Back pain, Breast cancer (HCC) (2001), Carpal tunnel syndrome, Crohn's disease (HCC), Diabetes (HCC), Fibromyalgia, GERD (gastroesophageal reflux disease),  Glaucoma, Headache, and Hypertension. Family: family history includes Breast cancer in her maternal aunt and mother; Heart attack (age of onset: 10) in her sister; Heart disease in her father.  Past Surgical History:  Procedure Laterality Date   BILATERAL CARPAL TUNNEL RELEASE Bilateral 02/05/2019   Procedure: BILATERAL CARPAL TUNNEL RELEASE;  Surgeon: Kennedy Bucker, MD;  Location: [facility] ORS;  Service: Orthopedics;  Laterality: Bilateral;   BREAST EXCISIONAL BIOPSY Right 15+ yrs ago   NEG   BREAST SURGERY     CATARACT EXTRACTION W/PHACO Right 10/30/2021   Procedure: CATARACT EXTRACTION PHACO AND INTRAOCULAR LENS PLACEMENT (IOC) RIGHT DIABETIC 1.85 00:20.9;  Surgeon: Nevada Crane, MD;  Location: [facility];  Service: Ophthalmology;  Laterality: Right;  Diabetic   COLONOSCOPY WITH PROPOFOL N/A 03/10/2020   Procedure: COLONOSCOPY WITH PROPOFOL;  Surgeon: Sung Amabile, DO;  Location: [facility];  Service: General;  Laterality: N/A;   FOOT SURGERY Left    LYMPH NODE BIOPSY     NASAL SEPTOPLASTY W/ TURBINOPLASTY Bilateral 12/25/2018   Procedure: NASAL SEPTOPLASTY WITH INFERIOR TURBINATE REDUCTION;  Surgeon: Vernie Murders, MD;  Location: [facility];  Service: ENT;  Laterality: Bilateral;   SHOULDER SURGERY     SMALL INTESTINE SURGERY     Active Ambulatory Problems    Diagnosis Date Noted   Chronic radicular lumbar pain 03/19/2023   Lumbar spondylosis 03/19/2023   Degeneration of intervertebral disc of lumbar region with discogenic back pain and lower extremity pain 03/19/2023   Chronic pain syndrome 03/19/2023   Resolved Ambulatory Problems    Diagnosis Date Noted   No Resolved Ambulatory Problems   Past Medical History:  Diagnosis Date   Arthritis    Asthma    Back pain    Breast cancer (HCC) 2001   Carpal tunnel syndrome    Crohn's disease (HCC)    Diabetes (HCC)    Fibromyalgia  GERD (gastroesophageal reflux disease)    Glaucoma    Headache     Hypertension    Constitutional Exam  General appearance: Well nourished, well developed, and well hydrated. In no apparent acute distress Vitals:   03/19/23 1118  BP: (!) 163/85  Pulse: 78  Resp: 18  Temp: 97.7 F (36.5 C)  TempSrc: Temporal  SpO2: 97%  Weight: 168 lb (76.2 kg)  Height: 5\' 1"  (1.549 m)   BMI Assessment: Estimated body mass index is 31.74 kg/m as calculated from the following:   Height as of this encounter: 5\' 1"  (1.549 m).   Weight as of this encounter: 168 lb (76.2 kg).  BMI interpretation table: BMI level Category Range association with higher incidence of chronic pain  <18 kg/m2 Underweight   18.5-24.9 kg/m2 Ideal body weight   25-29.9 kg/m2 Overweight Increased incidence by 20%  30-34.9 kg/m2 Obese (Class I) Increased incidence by 68%  35-39.9 kg/m2 Severe obesity (Class II) Increased incidence by 136%  >40 kg/m2 Extreme obesity (Class III) Increased incidence by 254%   Patient's current BMI Ideal Body weight  Body mass index is 31.74 kg/m. Ideal body weight: 47.8 kg (105 lb 6.1 oz) Adjusted ideal body weight: 59.2 kg (130 lb 6.8 oz)   BMI Readings from Last 4 Encounters:  03/19/23 31.74 kg/m  02/09/23 32.59 kg/m  01/21/23 31.18 kg/m  01/08/23 31.18 kg/m   Wt Readings from Last 4 Encounters:  03/19/23 168 lb (76.2 kg)  02/09/23 172 lb 8 oz (78.2 kg)  01/08/23 165 lb (74.8 kg)  12/11/22 170 lb 6.4 oz (77.3 kg)    Psych/Mental status: Alert, oriented x 3 (person, place, & time)       Eyes: PERLA Respiratory: No evidence of acute respiratory distress  Thoracic Spine Area Exam  Skin & Axial Inspection: No masses, redness, or swelling Alignment: Symmetrical Functional ROM: Unrestricted ROM Stability: No instability detected Muscle Tone/Strength: Functionally intact. No obvious neuro-muscular anomalies detected. Sensory (Neurological): Unimpaired Muscle strength & Tone: No palpable anomalies Lumbar Spine Area Exam  Skin & Axial  Inspection: No masses, redness, or swelling Alignment: Symmetrical Functional ROM: Pain restricted ROM affecting both sides Stability: No instability detected Muscle Tone/Strength: Functionally intact. No obvious neuro-muscular anomalies detected. Sensory (Neurological): Dermatomal pain pattern Palpation: No palpable anomalies       Provocative Tests: Hyperextension/rotation test: (+) bilaterally for facet joint pain. Lumbar quadrant test (Kemp's test): (+) bilateral for foraminal stenosis  Gait & Posture Assessment  Ambulation: Unassisted Gait: Relatively normal for age and body habitus Posture: WNL  Lower Extremity Exam    Side: Right lower extremity  Side: Left lower extremity  Stability: No instability observed          Stability: No instability observed          Skin & Extremity Inspection: Skin color, temperature, and hair growth are WNL. No peripheral edema or cyanosis. No masses, redness, swelling, asymmetry, or associated skin lesions. No contractures.  Skin & Extremity Inspection: Skin color, temperature, and hair growth are WNL. No peripheral edema or cyanosis. No masses, redness, swelling, asymmetry, or associated skin lesions. No contractures.  Functional ROM: Unrestricted ROM                  Functional ROM: Unrestricted ROM                  Muscle Tone/Strength: Functionally intact. No obvious neuro-muscular anomalies detected.  Muscle Tone/Strength: Functionally intact. No obvious neuro-muscular anomalies detected.  Sensory (Neurological): Neurogenic pain pattern        Sensory (Neurological): Neurogenic pain pattern        DTR: Patellar: deferred today Achilles: deferred today Plantar: deferred today  DTR: Patellar: deferred today Achilles: deferred today Plantar: deferred today  Palpation: No palpable anomalies  Palpation: No palpable anomalies    Assessment  Primary Diagnosis & Pertinent Problem List: The primary encounter diagnosis was Chronic radicular lumbar  pain. Diagnoses of Lumbar facet arthropathy, Lumbar spondylosis, Degeneration of intervertebral disc of lumbar region with discogenic back pain and lower extremity pain, and Chronic pain syndrome were also pertinent to this visit.  Visit Diagnosis (New problems to examiner): 1. Chronic radicular lumbar pain   2. Lumbar facet arthropathy   3. Lumbar spondylosis   4. Degeneration of intervertebral disc of lumbar region with discogenic back pain and lower extremity pain   5. Chronic pain syndrome    Plan of Care (Initial workup plan)  I discussed  percutaneous spinal cord stimulator trial with the patient in detail. I explained to pt how a spinal cord stimulation (SCS) is indicated for chronic, intractable pain, especially in cases of failed back surgery syndrome, radiculopathy, or pain syndromes that affect broader areas such as the back and legs.  SCS devices provide continuous electrical impulses to the spinal cord, modulating pain signals before they reach the brain.  We discussed the potential benefits and risks of spinal cord stimulation. Benefits: can provide substantial relief from chronic pain, long-term therapy with programmable settings, often reduces the need for oral medications, including opioids, some patients experience reduced dependency on other pain interventions. Risks (including but not limited to): Surgical risks, including infection, lead migration/fracture, and hardware malfunction, long-term implantation requires ongoing maintenance and possible reoperation, variable effectiveness: some patients do not experience sufficient pain relief.   I explained to the patient that they will have an external power source and programmer which the patient will use for 7 days. There will be daily communication with the stimulator company and the patient. A possible need for a mid-trial clinic visit to give the patient the best chance of success.   Patient has completed psychosocial behavioral  evaluation.  I was also able to evaluate her interlaminar windows under live fluoroscopy and they appear patent for percutaneous access.  I was very clear with the patient that I will be focusing only on the spinal cord stim trial as she is already established at the [facility] pain management clinic.  I informed her that she could also consider her spinal cord stimulator trial there however she states that Dr. Marcell Barlow with neurosurgery referred her here and that is what she wants to do.  I instructed her to let [facility] pain management know that she is being referred for a SCS trial evaluation here and to make sure that they do not have any issues with that.  She states that they are aware and that she will also call today and let them know.  Some of patient's pain does seem to be mechanical in nature, with some component of neurogenic pain as well. We discussed the indications for spinal cord stimulation, specifically stating that it is typically better for neuropathic and appendicular pain, but that we have had some success in the treatment of low back and hip pain.   Patient is interested in proceeding with spinal cord stimulation trial. She understands that this may not be successful, and that spinal cord stimulation in general is not a "magic bullet."  We had a lengthy and very detailed discussion of all the risks, benefits, alternatives, and rationale of surgery as well as the option of continuing nonsurgical therapies. We specifically discussed the risks of temporary or permanent worsened neurologic injury, no symptomatic relief or pain made worse after procedure, and also the need for future surgery (due to infection, CSF leak, bleeding, adjacent segment issues, bone-healing difficulties, and other related issues). No guarantees of outcome were made or implied and she is eager to proceed.  She me that all of her questions were answered thoroughly and to her satisfaction. Confidence and understanding of  the discussed risks and consequences of  treatment was expressed and she accepted these risks and was eager to proceed with procedure.   Issues concerning treatment and diagnosis were discussed with the patient. There are no barriers to understanding the plan of treatment. Explanation was well received by patient and/or family who then verbalized understanding.   We discussed a [employer] spinal cord stimulator trial.   Imaging Orders         DG PAIN CLINIC C-ARM 1-60 MIN NO REPORT     Procedure Orders         [place] TRIAL     Provider-requested follow-up: Return in about 20 days (around 04/08/2023) for [employer] SCS trial, ECT.  No future appointments.  Duration of encounter: .  Total time on encounter, as per AMA guidelines included both the face-to-face and non-face-to-face time personally spent by the physician and/or other qualified health care professional(s) on the day of the encounter (includes time in activities that require the physician or other qualified health care professional and does not include time in activities normally performed by clinical staff). Physician's time may include the following activities when performed: Preparing to see the patient (e.g., pre-charting review of records, searching for previously ordered imaging, lab work, and nerve conduction tests) Review of prior analgesic pharmacotherapies. Reviewing PMP Interpreting ordered tests (e.g., lab work, imaging, nerve conduction tests) Performing post-procedure evaluations, including interpretation of diagnostic procedures Obtaining and/or reviewing separately obtained history Performing a medically appropriate examination and/or evaluation Counseling and educating the patient/family/caregiver Ordering medications, tests, or procedures Referring and communicating with other health care professionals (when not separately reported) Documenting clinical information in the electronic or other health  record Independently interpreting results (not separately reported) and communicating results to the patient/ family/caregiver Care coordination (not separately reported)  Note by: Edward Jolly, MD (TTS technology used. I apologize for any typographical errors that were not detected and corrected.) Date: 03/19/2023; Time: 1:15 PM

## 2023-03-24 ENCOUNTER — Encounter: Payer: Self-pay | Admitting: Emergency Medicine

## 2023-03-24 ENCOUNTER — Ambulatory Visit
Admission: EM | Admit: 2023-03-24 | Discharge: 2023-03-24 | Disposition: A | Discharge status: Home / Self Care | Payer: 59 | Attending: Emergency Medicine | Admitting: Emergency Medicine

## 2023-03-24 DIAGNOSIS — T148XXA Other injury of unspecified body region, initial encounter: Secondary | ICD-10-CM

## 2023-03-24 DIAGNOSIS — R4020 Unspecified coma: Secondary | ICD-10-CM

## 2023-03-24 DIAGNOSIS — S0990XA Unspecified injury of head, initial encounter: Secondary | ICD-10-CM

## 2023-03-24 DIAGNOSIS — H547 Unspecified visual loss: Secondary | ICD-10-CM

## 2023-03-24 NOTE — ED Triage Notes (Addendum)
Patient states that she drove herself here to [facility].  Patient states that her horse fell on her head about 30 min ago prior to arrival.  Patient reports she feels headache.  Patient denies pain in her neck.  Patient reports LOC.  C-Collar applied.  Patient reports vision loss in her left eye.  Patient has left sided weakness.  Patient states that she does not remember how she got here.

## 2023-03-24 NOTE — ED Provider Notes (Signed)
[facility]    CSN: [id number] Arrival date & time: 03/24/23  1400      History   Chief Complaint Chief Complaint  Patient presents with   Head Injury    HPI Terri Woods is a 52 y.o. female.   52-year female, Terri Woods, presents to urgent care by POV after being crushed by a quarter horse prior to arrival while trying to put a bit in the horses mouth.  Patient states that her head "feels like there is water running in it", also complaining of left eye vision loss.  Patient reports + LOC. GCS 15(slight weakness on left as compared to right).  The history is provided by the patient. No language interpreter was used.    Past Medical History:  Diagnosis Date   Arthritis    neck, hips, fingers   Asthma    Back pain    Breast cancer (HCC) 2001   RT LUMPECTOMY PER PT   Carpal tunnel syndrome    right arm   Crohn's disease (HCC)    Diabetes (HCC)    Fibromyalgia    GERD (gastroesophageal reflux disease)    Glaucoma    Headache    Hypertension     Patient Active Problem List   Diagnosis Date Noted   Head trauma 03/24/2023   Crush injury 03/24/2023   Loss of consciousness (HCC) 03/24/2023   Loss of vision 03/24/2023   Chronic radicular lumbar pain 03/19/2023   Lumbar spondylosis 03/19/2023   Degeneration of intervertebral disc of lumbar region with discogenic back pain and lower extremity pain 03/19/2023   Chronic pain syndrome 03/19/2023    Past Surgical History:  Procedure Laterality Date   BILATERAL CARPAL TUNNEL RELEASE Bilateral 02/05/2019   Procedure: BILATERAL CARPAL TUNNEL RELEASE;  Surgeon: Kennedy Bucker, MD;  Location: [facility];  Service: Orthopedics;  Laterality: Bilateral;   BREAST EXCISIONAL BIOPSY Right 15+ yrs ago   NEG   BREAST SURGERY     CATARACT EXTRACTION W/PHACO Right 10/30/2021   Procedure: CATARACT EXTRACTION PHACO AND INTRAOCULAR LENS PLACEMENT (IOC) RIGHT DIABETIC 1.85 00:20.9;  Surgeon: Nevada Crane, MD;  Location:  [facility];  Service: Ophthalmology;  Laterality: Right;  Diabetic   COLONOSCOPY WITH PROPOFOL N/A 03/10/2020   Procedure: COLONOSCOPY WITH PROPOFOL;  Surgeon: Sung Amabile, DO;  Location: [facility];  Service: General;  Laterality: N/A;   FOOT SURGERY Left    LYMPH NODE BIOPSY     NASAL SEPTOPLASTY W/ TURBINOPLASTY Bilateral 12/25/2018   Procedure: NASAL SEPTOPLASTY WITH INFERIOR TURBINATE REDUCTION;  Surgeon: Vernie Murders, MD;  Location: [facility];  Service: ENT;  Laterality: Bilateral;   SHOULDER SURGERY     SMALL INTESTINE SURGERY      OB History   No obstetric history on file.      Home Medications    Prior to Admission medications   Medication Sig Start Date End Date Taking? Authorizing Provider  albuterol (VENTOLIN HFA) 108 (90 Base) MCG/ACT inhaler Inhale 2 puffs into the lungs every 6 (six) hours as needed for wheezing or shortness of breath. Patient taking differently: Inhale 2 puffs into the lungs every 6 (six) hours as needed for wheezing or shortness of breath. Last used: 6 am. 05/26/21   Rushie Chestnut, PA-C  atorvastatin (LIPITOR) 20 MG tablet Take 40 mg by mouth daily at 6 PM.    [provider]  azelastine (ASTELIN) 0.1 % nasal spray Place into both nostrils 2 (two) times daily as  needed for rhinitis. Use in each nostril as directed    [provider]  bimatoprost (LUMIGAN) 0.01 % SOLN Place 1 drop into both eyes at bedtime.    [provider]  brimonidine (ALPHAGAN) 0.2 % ophthalmic solution Place into both eyes in the morning and at bedtime.    [provider]  clonazePAM (KLONOPIN) 1 MG tablet Take 1 mg by mouth 3 (three) times daily as needed.    [provider]  cyclobenzaprine (FLEXERIL) 10 MG tablet Take 10 mg by mouth 3 (three) times daily as needed for muscle spasms.    [provider]  docusate sodium (COLACE) 100 MG capsule Take 100 mg by mouth 2 (two) times daily as needed for  mild constipation.    [provider]  DULoxetine (CYMBALTA) 30 MG capsule Take 30 mg by mouth at bedtime. Take with 60 mg for total of 90 mg    [provider]  DULoxetine (CYMBALTA) 60 MG capsule Take 60 mg by mouth at bedtime. Take with 30 mg for total of 90 mg.    [provider]  EPINEPHrine (EPIPEN 2-PAK) 0.3 mg/0.3 mL IJ SOAJ injection Inject 0.3 mg into the muscle as needed for anaphylaxis. 01/21/23   Minna Antis, MD  EPINEPHrine (EPIPEN 2-PAK) 0.3 mg/0.3 mL IJ SOAJ injection Inject 0.3 mg into the muscle as needed. 02/09/23   Phineas Semen, MD  ergocalciferol (VITAMIN D2) 1.25 MG (50000 UT) capsule Take 50,000 Units by mouth See admin instructions. Take 1 capsule (50000 units) by mouth once a week or once every other week    [provider]  glipiZIDE (GLUCOTROL) 5 MG tablet Take 10 mg by mouth 2 (two) times daily.    [provider]  ibuprofen (ADVIL) 800 MG tablet Take 800 mg by mouth every 8 (eight) hours as needed (pain.).    [provider]  insulin glargine (LANTUS) 100 UNIT/ML injection Inject 40 Units into the skin at bedtime.    [provider]  ipratropium (ATROVENT) 0.06 % nasal spray Place 2 sprays into both nostrils 4 (four) times daily. 06/01/21   Becky Augusta, NP  ketoconazole (NIZORAL) 2 % shampoo Apply 1 application topically 2 (two) times a week.    [provider]  ketorolac (TORADOL) 10 MG tablet Take 1 tablet (10 mg total) by mouth every 6 (six) hours as needed. 05/27/20   Becky Augusta, NP  lidocaine (LIDODERM) 5 % Place 2 patches onto the skin daily. Remove & Discard patch within 12 hours or as directed by MD    [provider]  lidocaine (XYLOCAINE) 5 % ointment Apply 1 application topically as needed (pain.).     [provider]  lisinopril (ZESTRIL) 2.5 MG tablet Take 5 mg by mouth every evening.    [provider]  miconazole (MICOTIN) 2 % cream Apply 1 application  topically as needed (skin irritation/rash). Patient not taking: Reported on 03/19/2023    [provider]  naloxone [facility]) nasal spray 4 mg/0.1 mL Place 1 spray into the nose as needed (accidental overdose).    [provider]  omeprazole (PRILOSEC) 20 MG capsule Take 20 mg by mouth at bedtime.    [provider]  ondansetron (ZOFRAN ODT) 8 MG disintegrating tablet Take 1 tablet (8 mg total) by mouth every 8 (eight) hours as needed for nausea or vomiting. 01/07/23   Radford Pax, NP  Oxycodone HCl 10 MG TABS Take 10 mg by mouth 3 (three)  times daily.    [provider]  oxyCODONE-acetaminophen (PERCOCET/ROXICET) 5-325 MG tablet Take 1 tablet by mouth every 4 (four) hours as needed (pain.).     [provider]  QUEtiapine (SEROQUEL) 300 MG tablet Take 300 mg by mouth at bedtime.    [provider]  SUMAtriptan (IMITREX) 100 MG tablet Take 100 mg by mouth every 2 (two) hours as needed for migraine. May repeat in 2 hours if headache persists or recurs.    [provider]  triamcinolone (NASACORT) 55 MCG/ACT AERO nasal inhaler Place 2 sprays into the nose daily.    [provider]  triamcinolone cream (KENALOG) 0.1 % Apply 1 application topically 2 (two) times daily. 08/31/19   Payton Mccallum, MD  valACYclovir (VALTREX) 1000 MG tablet Take 1 tablet (1,000 mg total) by mouth 3 (three) times daily. 03/13/21   Becky Augusta, NP  VICTOZA 18 MG/3ML SOPN SMARTSIG:0.6 Milligram(s) SUB-Q Daily 02/17/21   [provider]  topiramate (TOPAMAX) 100 MG tablet Take 100 mg by mouth 2 (two) times daily.  01/06/20  [provider]    Family History Family History  Problem Relation Age of Onset   Heart attack Sister 30   Heart disease Father    Breast cancer Mother    Breast cancer Maternal Aunt     Social History Social History   Tobacco Use   Smoking status: Never   Smokeless tobacco: Never  Vaping Use   Vaping  status: Never Used  Substance Use Topics   Alcohol use: No    Alcohol/week: 0.0 standard drinks of alcohol   Drug use: Yes    Types: Oxycodone     Allergies   Exenatide, Levodopa, Lyrica [pregabalin], Other, Rotigotine, Erythromycin, Bee venom, Fentanyl, Hysingla er [hydrocodone bitartrate er], Meloxicam, Doxycycline, Morphine, Penicillins, Semaglutide, and Shellfish allergy   Review of Systems Review of Systems  Eyes:  Positive for visual disturbance.  Neurological:  Positive for weakness and headaches.  All other systems reviewed and are negative.    Physical Exam Triage Vital Signs ED Triage Vitals  Encounter Vitals Group     BP 03/24/23 1409 (!) 159/96     Systolic BP Percentile --      Diastolic BP Percentile --      Pulse Rate 03/24/23 1409 96     Resp 03/24/23 1409 14     Temp 03/24/23 1409 97.8 F (36.6 C)     Temp Source 03/24/23 1409 Oral     SpO2 03/24/23 1409 97 %     Weight 03/24/23 1407 167 lb 15.9 oz (76.2 kg)     Height 03/24/23 1407 5\' 1"  (1.549 m)     Head Circumference --      Peak Flow --      Pain Score 03/24/23 1407 10     Pain Loc --      Pain Education --      Exclude from Growth Chart --    No data found.  Updated Vital Signs BP (!) 159/96 (BP Location: Right Arm)   Pulse 96   Temp 97.8 F (36.6 C) (Oral)   Resp 14   Ht 5\' 1"  (1.549 m)   Wt 167 lb 15.9 oz (76.2 kg)   SpO2 97%   BMI 31.74 kg/m   Visual Acuity Right Eye Distance:   Left Eye Distance:   Bilateral Distance:    Right Eye Near:   Left Eye Near:    Bilateral Near:  Physical Exam Vitals and nursing note reviewed.  Constitutional:      Appearance: She is well-developed and well-groomed.  HENT:     Head:   Eyes:     Pupils:     Right eye: Pupil is sluggish.     Comments: Right lens surgery, left eye reactive  Neck:     Comments: Pt denies neck tenderness or stepoffs, C Collar placed due to MOI Cardiovascular:     Rate and Rhythm: Normal rate and regular  rhythm.     Pulses: Normal pulses.     Heart sounds: Normal heart sounds.  Pulmonary:     Effort: Pulmonary effort is normal.     Breath sounds: Normal breath sounds and air entry.  Musculoskeletal:     Cervical back: No deformity or crepitus.     Comments: No step offs  Neurological:     Mental Status: She is alert.     GCS: GCS eye subscore is 4. GCS verbal subscore is 5. GCS motor subscore is 6.     Cranial Nerves: No cranial nerve deficit.     Sensory: Sensation is intact.     Motor: Weakness present.     Comments: Slight left sided weakness with handgrips bilaterally  Psychiatric:        Attention and Perception: Attention normal.        Mood and Affect: Mood normal.        Speech: Speech normal.        Behavior: Behavior normal.     UC Treatments / Results  Labs (all labs ordered are listed, but only abnormal results are displayed) Labs Reviewed - No data to display  EKG   Radiology No results found.  Procedures Procedures (including critical care time)  Medications Ordered in UC Medications - No data to display  Initial Impression / Assessment and Plan / UC Course  I have reviewed the triage vital signs and the nursing notes.  Pertinent labs & imaging results that were available during my care of the patient were reviewed by me and considered in my medical decision making (see chart for details).    Patient sent emergently to ER for further evaluation, EMS transported.  C-collar in place.  Ddx: Crush injury by horse, traumatic injury of head, positive loss of consciousness Final Clinical Impressions(s) / UC Diagnoses   Final diagnoses:  Traumatic injury of head, initial encounter  Crush injury  Loss of consciousness (HCC)  Loss of vision   Discharge Instructions   None    ED Prescriptions   None    PDMP not reviewed this encounter.   Clancy Gourd, NP 03/24/23 1616

## 2023-03-24 NOTE — ED Notes (Signed)
Patient's gold toned necklace removed from her neck and placed in her pocket.

## 2023-03-24 NOTE — ED Notes (Signed)
Patient is being discharged from the Urgent Care and sent to the Emergency Department via EMS . Per Clancy Gourd, NP, patient is in need of higher level of care due to head injury with LOC. Patient is aware and verbalizes understanding of plan of care.  Vitals:   03/24/23 1409  BP: (!) 159/96  Pulse: 96  Resp: 14  Temp: 97.8 F (36.6 C)  SpO2: 97%

## 2023-03-25 ENCOUNTER — Emergency Department (HOSPITAL_COMMUNITY)
Admission: EM | Admit: 2023-03-25 | Discharge: 2023-03-26 | Disposition: A | Discharge status: Home / Self Care | Payer: 59 | Source: Home / Self Care | Attending: Emergency Medicine | Admitting: Emergency Medicine

## 2023-03-25 ENCOUNTER — Other Ambulatory Visit: Payer: Self-pay

## 2023-03-25 ENCOUNTER — Emergency Department: Payer: 59

## 2023-03-25 DIAGNOSIS — Y92002 Bathroom of unspecified non-institutional (private) residence single-family (private) house as the place of occurrence of the external cause: Secondary | ICD-10-CM | POA: Insufficient documentation

## 2023-03-25 DIAGNOSIS — R45851 Suicidal ideations: Secondary | ICD-10-CM

## 2023-03-25 DIAGNOSIS — E119 Type 2 diabetes mellitus without complications: Secondary | ICD-10-CM | POA: Insufficient documentation

## 2023-03-25 DIAGNOSIS — Z853 Personal history of malignant neoplasm of breast: Secondary | ICD-10-CM | POA: Insufficient documentation

## 2023-03-25 DIAGNOSIS — S060XAA Concussion with loss of consciousness status unknown, initial encounter: Secondary | ICD-10-CM | POA: Insufficient documentation

## 2023-03-25 DIAGNOSIS — J45909 Unspecified asthma, uncomplicated: Secondary | ICD-10-CM | POA: Insufficient documentation

## 2023-03-25 DIAGNOSIS — F411 Generalized anxiety disorder: Secondary | ICD-10-CM | POA: Insufficient documentation

## 2023-03-25 DIAGNOSIS — F329 Major depressive disorder, single episode, unspecified: Secondary | ICD-10-CM | POA: Diagnosis not present

## 2023-03-25 DIAGNOSIS — I10 Essential (primary) hypertension: Secondary | ICD-10-CM | POA: Insufficient documentation

## 2023-03-25 DIAGNOSIS — X58XXXA Exposure to other specified factors, initial encounter: Secondary | ICD-10-CM | POA: Insufficient documentation

## 2023-03-25 LAB — URINE DRUG SCREEN, QUALITATIVE (ARMC ONLY)
Amphetamines, Ur Screen: NOT DETECTED
Barbiturates, Ur Screen: NOT DETECTED
Benzodiazepine, Ur Scrn: POSITIVE — AB
Cannabinoid 50 Ng, Ur ~~LOC~~: NOT DETECTED
Cocaine Metabolite,Ur ~~LOC~~: NOT DETECTED
MDMA (Ecstasy)Ur Screen: NOT DETECTED
Methadone Scn, Ur: NOT DETECTED
Opiate, Ur Screen: POSITIVE — AB
Phencyclidine (PCP) Ur S: NOT DETECTED
Tricyclic, Ur Screen: POSITIVE — AB

## 2023-03-25 LAB — CBC WITH DIFFERENTIAL/PLATELET
Abs Immature Granulocytes: 0.09 10*3/uL — ABNORMAL HIGH (ref 0.00–0.07)
Basophils Absolute: 0 10*3/uL (ref 0.0–0.1)
Basophils Relative: 0 %
Eosinophils Absolute: 0 10*3/uL (ref 0.0–0.5)
Eosinophils Relative: 0 %
HCT: 36.1 % (ref 36.0–46.0)
Hemoglobin: 12.1 g/dL (ref 12.0–15.0)
Immature Granulocytes: 1 %
Lymphocytes Relative: 13 %
Lymphs Abs: 1.7 10*3/uL (ref 0.7–4.0)
MCH: 28.4 pg (ref 26.0–34.0)
MCHC: 33.5 g/dL (ref 30.0–36.0)
MCV: 84.7 fL (ref 80.0–100.0)
Monocytes Absolute: 1 10*3/uL (ref 0.1–1.0)
Monocytes Relative: 7 %
Neutro Abs: 10.8 10*3/uL — ABNORMAL HIGH (ref 1.7–7.7)
Neutrophils Relative %: 79 %
Platelets: 223 10*3/uL (ref 150–400)
RBC: 4.26 MIL/uL (ref 3.87–5.11)
RDW: 14 % (ref 11.5–15.5)
WBC: 13.7 10*3/uL — ABNORMAL HIGH (ref 4.0–10.5)
nRBC: 0.1 % (ref 0.0–0.2)

## 2023-03-25 LAB — CBG MONITORING, ED: Glucose-Capillary: 155 mg/dL — ABNORMAL HIGH (ref 70–99)

## 2023-03-25 LAB — BASIC METABOLIC PANEL
Anion gap: 9 (ref 5–15)
BUN: 35 mg/dL — ABNORMAL HIGH (ref 6–20)
CO2: 24 mmol/L (ref 22–32)
Calcium: 9.3 mg/dL (ref 8.9–10.3)
Chloride: 101 mmol/L (ref 98–111)
Creatinine, Ser: 1.04 mg/dL — ABNORMAL HIGH (ref 0.44–1.00)
GFR, Estimated: >60 mL/min (ref >60)
Glucose, Bld: 198 mg/dL — ABNORMAL HIGH (ref 70–99)
Potassium: 3.8 mmol/L (ref 3.5–5.1)
Sodium: 134 mmol/L — ABNORMAL LOW (ref 135–145)

## 2023-03-25 LAB — ETHANOL: Alcohol, Ethyl (B): <10 mg/dL (ref <10)

## 2023-03-25 LAB — SALICYLATE LEVEL: Salicylate Lvl: <7 mg/dL — ABNORMAL LOW (ref 7.0–30.0)

## 2023-03-25 LAB — ACETAMINOPHEN LEVEL: Acetaminophen (Tylenol), Serum: <10 ug/mL — ABNORMAL LOW (ref 10–30)

## 2023-03-25 MED ORDER — ONDANSETRON 4 MG PO TBDP
4.0000 mg | ORAL_TABLET | Freq: Once | ORAL | Status: AC
  Administered 2023-03-25: 4 mg via ORAL
  Filled 2023-03-25: qty 1

## 2023-03-25 MED ORDER — OXYCODONE HCL 5 MG PO TABS
5.0000 mg | ORAL_TABLET | Freq: Once | ORAL | Status: AC
  Administered 2023-03-25: 5 mg via ORAL
  Filled 2023-03-25: qty 1

## 2023-03-25 NOTE — ED Triage Notes (Signed)
Pt in from home via EMS, Per EMS patient in with superficial cuts to her right wrist. Patient denies SI or HI at this time. States she hit her head in the bathroom today. C/O dizziness and nausea. EMS provided text message of letter that patient sent to husband stating that she "might not see him anymore, I tried to kill myself tonight".

## 2023-03-25 NOTE — ED Provider Notes (Addendum)
[facility] Provider Note    Event Date/Time   First MD Initiated Contact with Patient 03/25/23 1910     (approximate)   History   Suicidal   HPI Terri Woods is a 52 y.o. female presenting today for head injury and SI.  Patient states she hit her head while in the shower and since then has had blurry vision.  Reports she was seen in the emergency department yesterday for traumatic injury and was diagnosed with severe concussion.  She notes mostly having headache and nausea at this time.  Denies any other acute symptoms.  Denies SI or HI.  Separately, patient had been texting her husband throughout the day with text messages seen here stating that she wanted to kill herself and not live anymore.  This was why EMS was called.  Also, she has cuts to her right wrist.  When asked about this she stated she fell with a knife that accidentally cut her 1 time.  There are 5-6 cut marks on her right wrist.  Chart review: Patient was just seen in the [facility] ED yesterday after reported traumatic injury with getting hit by a horse and having a horse fall on her.  She had CT imaging of the head, cervical spine, chest/abdomen/pelvis, thoracic spine, and lumbar spine which showed no acute injuries.  Her laboratory workup was also all reassuring at that time.  Safely discharged at that point.     Physical Exam   Triage Vital Signs: ED Triage Vitals  Encounter Vitals Group     BP      Systolic BP Percentile      Diastolic BP Percentile      Pulse      Resp      Temp      Temp src      SpO2      Weight      Height      Head Circumference      Peak Flow      Pain Score      Pain Loc      Pain Education      Exclude from Growth Chart     Most recent vital signs: Vitals:   03/25/23 1928  BP: 135/68  Pulse: 96  Resp: 18  Temp: 97.8 F (36.6 C)  SpO2: 97%    I have reviewed the vital signs. General:  Awake, alert, uncomfortable appearing. Head:   Normocephalic, Atraumatic. EENT:  PERRL, EOMI, Oral mucosa pink and moist, Neck is supple. Cardiovascular: Regular rate, 2+ distal pulses. Respiratory:  Normal respiratory effort, symmetrical expansion, no distress.   Extremities:  Moving all four extremities through full ROM without pain.   Neuro:  Alert and oriented.  Interacting appropriately.  Cranial nerves II through XII intact.  Patient noting blurry vision although not appreciable on actual physical exam. Skin:  Warm, dry, no rash.  5-6 horizontal cut marks to her right wrist Psych: Tearful.   ED Results / Procedures / Treatments   Labs (all labs ordered are listed, but only abnormal results are displayed) Labs Reviewed  CBC WITH DIFFERENTIAL/PLATELET - Abnormal; Notable for the following components:      Result Value   WBC 13.7 (*)    Neutro Abs 10.8 (*)    Abs Immature Granulocytes 0.09 (*)    All other components within normal limits  BASIC METABOLIC PANEL - Abnormal; Notable for the following components:   Sodium 134 (*)  Glucose, Bld 198 (*)    BUN 35 (*)    Creatinine, Ser 1.04 (*)    All other components within normal limits  SALICYLATE LEVEL - Abnormal; Notable for the following components:   Salicylate Lvl <7.0 (*)    All other components within normal limits  ACETAMINOPHEN LEVEL - Abnormal; Notable for the following components:   Acetaminophen (Tylenol), Serum <10 (*)    All other components within normal limits  URINE DRUG SCREEN, QUALITATIVE ([facility] ONLY) - Abnormal; Notable for the following components:   Tricyclic, Ur Screen POSITIVE (*)    Opiate, Ur Screen POSITIVE (*)    Benzodiazepine, Ur Scrn POSITIVE (*)    All other components within normal limits  CBG MONITORING, ED - Abnormal; Notable for the following components:   Glucose-Capillary 155 (*)    All other components within normal limits  ETHANOL     EKG    RADIOLOGY Independently interpreted CT head with no acute  pathology.   PROCEDURES:  Critical Care performed: No  Procedures   MEDICATIONS ORDERED IN ED: Medications  ondansetron (ZOFRAN-ODT) disintegrating tablet 4 mg (4 mg Oral Given 03/25/23 1943)     IMPRESSION / MDM / ASSESSMENT AND PLAN / ED COURSE  I reviewed the triage vital signs and the nursing notes.                              Differential diagnosis includes, but is not limited to, suicidal ideation, concussion, ICH  Patient's presentation is most consistent with acute complicated illness / injury requiring diagnostic workup.  Patient is a 52 year old female presenting today for suicidal ideation.  Multiple text messages shown to EMS by husband of patient stating she wanted to kill herself, hurt herself, and end her life.  On arrival, patient is more concerned about reported fall today with head injury.  She states unknown loss of consciousness.  States she was just in the hospital yesterday for severe concussion.  Noting nausea but no vomiting symptoms seen here.  Vital signs otherwise stable.  Patient has a myriad of complaints which do not match up with her physical exam findings at this time.  Laboratory workup reassuring.  CT imaging performed which per my interpretation shows no acute pathology.  Patient was medically cleared and psychiatry was consulted for further care.  The patient has been placed in psychiatric observation due to the need to provide a safe environment for the patient while obtaining psychiatric consultation and evaluation, as well as ongoing medical and medication management to treat the patient's condition.  The patient has been placed under full IVC at this time.     FINAL CLINICAL IMPRESSION(S) / ED DIAGNOSES   Final diagnoses:  Concussion with unknown loss of consciousness status, initial encounter  Suicidal ideation     Rx / DC Orders   ED Discharge Orders     None        Note:  This document was prepared using Dragon voice  recognition software and may include unintentional dictation errors.   Janith Lima, MD 03/25/23 Juliann Pares    Janith Lima, MD 03/25/23 [phone]

## 2023-03-25 NOTE — ED Notes (Signed)
Pt asleep, PM snack not offered at this time. Will offer once pt awakens.

## 2023-03-26 ENCOUNTER — Inpatient Hospital Stay
Admission: AD | Admit: 2023-03-26 | Discharge: 2023-03-30 | DRG: 881 | Disposition: A | Discharge status: Home / Self Care | Payer: 59 | Source: Intra-hospital | Attending: Psychiatry | Admitting: Psychiatry

## 2023-03-26 ENCOUNTER — Encounter: Payer: Self-pay | Admitting: Psychiatry

## 2023-03-26 DIAGNOSIS — E119 Type 2 diabetes mellitus without complications: Secondary | ICD-10-CM | POA: Diagnosis present

## 2023-03-26 DIAGNOSIS — Z888 Allergy status to other drugs, medicaments and biological substances status: Secondary | ICD-10-CM

## 2023-03-26 DIAGNOSIS — M797 Fibromyalgia: Secondary | ICD-10-CM | POA: Diagnosis present

## 2023-03-26 DIAGNOSIS — R45851 Suicidal ideations: Secondary | ICD-10-CM | POA: Diagnosis not present

## 2023-03-26 DIAGNOSIS — M47812 Spondylosis without myelopathy or radiculopathy, cervical region: Secondary | ICD-10-CM | POA: Diagnosis present

## 2023-03-26 DIAGNOSIS — J45909 Unspecified asthma, uncomplicated: Secondary | ICD-10-CM | POA: Diagnosis present

## 2023-03-26 DIAGNOSIS — Z853 Personal history of malignant neoplasm of breast: Secondary | ICD-10-CM | POA: Diagnosis not present

## 2023-03-26 DIAGNOSIS — Z881 Allergy status to other antibiotic agents status: Secondary | ICD-10-CM | POA: Diagnosis not present

## 2023-03-26 DIAGNOSIS — Z803 Family history of malignant neoplasm of breast: Secondary | ICD-10-CM

## 2023-03-26 DIAGNOSIS — Z79899 Other long term (current) drug therapy: Secondary | ICD-10-CM

## 2023-03-26 DIAGNOSIS — M160 Bilateral primary osteoarthritis of hip: Secondary | ICD-10-CM | POA: Diagnosis present

## 2023-03-26 DIAGNOSIS — Z9151 Personal history of suicidal behavior: Secondary | ICD-10-CM | POA: Diagnosis not present

## 2023-03-26 DIAGNOSIS — K219 Gastro-esophageal reflux disease without esophagitis: Secondary | ICD-10-CM | POA: Diagnosis present

## 2023-03-26 DIAGNOSIS — G8929 Other chronic pain: Secondary | ICD-10-CM | POA: Diagnosis present

## 2023-03-26 DIAGNOSIS — K5090 Crohn's disease, unspecified, without complications: Secondary | ICD-10-CM | POA: Diagnosis present

## 2023-03-26 DIAGNOSIS — F332 Major depressive disorder, recurrent severe without psychotic features: Secondary | ICD-10-CM | POA: Diagnosis not present

## 2023-03-26 DIAGNOSIS — Z8249 Family history of ischemic heart disease and other diseases of the circulatory system: Secondary | ICD-10-CM | POA: Diagnosis not present

## 2023-03-26 DIAGNOSIS — Z7984 Long term (current) use of oral hypoglycemic drugs: Secondary | ICD-10-CM | POA: Diagnosis not present

## 2023-03-26 DIAGNOSIS — M549 Dorsalgia, unspecified: Secondary | ICD-10-CM | POA: Diagnosis present

## 2023-03-26 DIAGNOSIS — Z794 Long term (current) use of insulin: Secondary | ICD-10-CM

## 2023-03-26 DIAGNOSIS — Z886 Allergy status to analgesic agent status: Secondary | ICD-10-CM

## 2023-03-26 DIAGNOSIS — M19041 Primary osteoarthritis, right hand: Secondary | ICD-10-CM | POA: Diagnosis present

## 2023-03-26 DIAGNOSIS — Z87820 Personal history of traumatic brain injury: Secondary | ICD-10-CM | POA: Diagnosis not present

## 2023-03-26 DIAGNOSIS — Z818 Family history of other mental and behavioral disorders: Secondary | ICD-10-CM

## 2023-03-26 DIAGNOSIS — Z885 Allergy status to narcotic agent status: Secondary | ICD-10-CM

## 2023-03-26 DIAGNOSIS — H409 Unspecified glaucoma: Secondary | ICD-10-CM | POA: Diagnosis present

## 2023-03-26 DIAGNOSIS — M19042 Primary osteoarthritis, left hand: Secondary | ICD-10-CM | POA: Diagnosis present

## 2023-03-26 DIAGNOSIS — I10 Essential (primary) hypertension: Secondary | ICD-10-CM | POA: Diagnosis present

## 2023-03-26 DIAGNOSIS — Z880 Allergy status to penicillin: Secondary | ICD-10-CM

## 2023-03-26 DIAGNOSIS — F329 Major depressive disorder, single episode, unspecified: Principal | ICD-10-CM | POA: Diagnosis present

## 2023-03-26 DIAGNOSIS — F411 Generalized anxiety disorder: Secondary | ICD-10-CM | POA: Diagnosis present

## 2023-03-26 DIAGNOSIS — Z91013 Allergy to seafood: Secondary | ICD-10-CM

## 2023-03-26 DIAGNOSIS — Z79891 Long term (current) use of opiate analgesic: Secondary | ICD-10-CM

## 2023-03-26 DIAGNOSIS — F324 Major depressive disorder, single episode, in partial remission: Secondary | ICD-10-CM | POA: Diagnosis not present

## 2023-03-26 LAB — CBG MONITORING, ED: Glucose-Capillary: 240 mg/dL — ABNORMAL HIGH (ref 70–99)

## 2023-03-26 LAB — GLUCOSE, CAPILLARY: Glucose-Capillary: 230 mg/dL — ABNORMAL HIGH (ref 70–99)

## 2023-03-26 MED ORDER — GLIPIZIDE 10 MG PO TABS
10.0000 mg | ORAL_TABLET | Freq: Two times a day (BID) | ORAL | Status: DC
  Administered 2023-03-26 – 2023-03-30 (×8): 10 mg via ORAL
  Filled 2023-03-26 (×9): qty 1

## 2023-03-26 MED ORDER — QUETIAPINE FUMARATE 25 MG PO TABS: 300.0000 mg | ORAL_TABLET | Freq: Every day | ORAL | Status: DC

## 2023-03-26 MED ORDER — OXYCODONE HCL ER 10 MG PO T12A
10.0000 mg | EXTENDED_RELEASE_TABLET | Freq: Three times a day (TID) | ORAL | Status: DC
  Administered 2023-03-26 – 2023-03-29 (×8): 10 mg via ORAL
  Filled 2023-03-26 (×8): qty 1

## 2023-03-26 MED ORDER — INSULIN GLARGINE-YFGN 100 UNIT/ML ~~LOC~~ SOLN
50.0000 [IU] | Freq: Every day | SUBCUTANEOUS | Status: DC
  Filled 2023-03-26: qty 0.5

## 2023-03-26 MED ORDER — AMLODIPINE BESYLATE 5 MG PO TABS
5.0000 mg | ORAL_TABLET | Freq: Every day | ORAL | Status: DC
  Administered 2023-03-26 – 2023-03-30 (×5): 5 mg via ORAL
  Filled 2023-03-26 (×5): qty 1

## 2023-03-26 MED ORDER — ONDANSETRON 4 MG PO TBDP
4.0000 mg | ORAL_TABLET | Freq: Three times a day (TID) | ORAL | Status: DC | PRN
  Administered 2023-03-26 – 2023-03-28 (×2): 4 mg via ORAL
  Filled 2023-03-26 (×2): qty 1

## 2023-03-26 MED ORDER — ATORVASTATIN CALCIUM 20 MG PO TABS
40.0000 mg | ORAL_TABLET | Freq: Every day | ORAL | Status: DC
  Administered 2023-03-27 – 2023-03-30 (×4): 40 mg via ORAL
  Filled 2023-03-26 (×4): qty 2

## 2023-03-26 MED ORDER — ALUM & MAG HYDROXIDE-SIMETH 200-200-20 MG/5ML PO SUSP: 30.0000 mL | ORAL | Status: DC | PRN

## 2023-03-26 MED ORDER — INSULIN GLARGINE-YFGN 100 UNIT/ML ~~LOC~~ SOLN
50.0000 [IU] | Freq: Every day | SUBCUTANEOUS | Status: DC
  Administered 2023-03-26 – 2023-03-29 (×4): 50 [IU] via SUBCUTANEOUS
  Filled 2023-03-26 (×4): qty 0.5

## 2023-03-26 MED ORDER — LORAZEPAM 2 MG PO TABS: 2.0000 mg | ORAL_TABLET | Freq: Three times a day (TID) | ORAL | Status: DC | PRN

## 2023-03-26 MED ORDER — ATORVASTATIN CALCIUM 20 MG PO TABS
40.0000 mg | ORAL_TABLET | Freq: Every day | ORAL | Status: DC
  Administered 2023-03-26: 40 mg via ORAL
  Filled 2023-03-26: qty 2

## 2023-03-26 MED ORDER — LISINOPRIL 20 MG PO TABS
20.0000 mg | ORAL_TABLET | Freq: Every day | ORAL | Status: DC
  Administered 2023-03-27 – 2023-03-30 (×4): 20 mg via ORAL
  Filled 2023-03-26 (×4): qty 1

## 2023-03-26 MED ORDER — CYCLOBENZAPRINE HCL 10 MG PO TABS: 10.0000 mg | ORAL_TABLET | Freq: Three times a day (TID) | ORAL | Status: DC | PRN

## 2023-03-26 MED ORDER — DIPHENHYDRAMINE HCL 50 MG/ML IJ SOLN: 50.0000 mg | Freq: Three times a day (TID) | INTRAMUSCULAR | Status: DC | PRN

## 2023-03-26 MED ORDER — DIPHENHYDRAMINE HCL 25 MG PO CAPS: 50.0000 mg | ORAL_CAPSULE | Freq: Three times a day (TID) | ORAL | Status: DC | PRN

## 2023-03-26 MED ORDER — DULOXETINE HCL 60 MG PO CPEP: 60.0000 mg | ORAL_CAPSULE | Freq: Every day | ORAL | Status: DC

## 2023-03-26 MED ORDER — OXYCODONE-ACETAMINOPHEN 5-325 MG PO TABS: 1.0000 | ORAL_TABLET | ORAL | Status: DC | PRN

## 2023-03-26 MED ORDER — CLONAZEPAM 1 MG PO TABS
2.0000 mg | ORAL_TABLET | Freq: Two times a day (BID) | ORAL | Status: DC | PRN
  Administered 2023-03-26: 2 mg via ORAL
  Filled 2023-03-26: qty 4

## 2023-03-26 MED ORDER — MAGNESIUM HYDROXIDE 400 MG/5ML PO SUSP: 30.0000 mL | Freq: Every day | ORAL | Status: DC | PRN

## 2023-03-26 MED ORDER — DULOXETINE HCL 30 MG PO CPEP
30.0000 mg | ORAL_CAPSULE | Freq: Every day | ORAL | Status: DC
  Administered 2023-03-26 – 2023-03-29 (×4): 30 mg via ORAL
  Filled 2023-03-26 (×4): qty 1

## 2023-03-26 MED ORDER — HALOPERIDOL 5 MG PO TABS: 5.0000 mg | ORAL_TABLET | Freq: Three times a day (TID) | ORAL | Status: DC | PRN

## 2023-03-26 MED ORDER — DULOXETINE HCL 30 MG PO CPEP
60.0000 mg | ORAL_CAPSULE | Freq: Every day | ORAL | Status: DC
  Administered 2023-03-26 – 2023-03-29 (×4): 60 mg via ORAL
  Filled 2023-03-26 (×4): qty 2

## 2023-03-26 MED ORDER — DULOXETINE HCL 30 MG PO CPEP: 30.0000 mg | ORAL_CAPSULE | Freq: Every day | ORAL | Status: DC

## 2023-03-26 MED ORDER — BRIMONIDINE TARTRATE 0.2 % OP SOLN
1.0000 [drp] | Freq: Two times a day (BID) | OPHTHALMIC | Status: DC
  Filled 2023-03-26: qty 5

## 2023-03-26 MED ORDER — CLONAZEPAM 1 MG PO TABS: 2.0000 mg | ORAL_TABLET | Freq: Two times a day (BID) | ORAL | Status: DC | PRN

## 2023-03-26 MED ORDER — BRIMONIDINE TARTRATE 0.2 % OP SOLN
1.0000 [drp] | Freq: Two times a day (BID) | OPHTHALMIC | Status: DC
  Administered 2023-03-27 – 2023-03-30 (×7): 1 [drp] via OPHTHALMIC
  Filled 2023-03-26: qty 5

## 2023-03-26 MED ORDER — NALOXONE HCL 4 MG/0.1ML NA LIQD: 1.0000 | NASAL | Status: DC | PRN

## 2023-03-26 MED ORDER — LORAZEPAM 2 MG/ML IJ SOLN: 2.0000 mg | Freq: Three times a day (TID) | INTRAMUSCULAR | Status: DC | PRN

## 2023-03-26 MED ORDER — LISINOPRIL 5 MG PO TABS
10.0000 mg | ORAL_TABLET | Freq: Two times a day (BID) | ORAL | Status: DC
  Administered 2023-03-26: 10 mg via ORAL
  Filled 2023-03-26: qty 2

## 2023-03-26 MED ORDER — QUETIAPINE FUMARATE 200 MG PO TABS
300.0000 mg | ORAL_TABLET | Freq: Every day | ORAL | Status: DC
  Administered 2023-03-26 – 2023-03-29 (×4): 300 mg via ORAL
  Filled 2023-03-26 (×4): qty 1

## 2023-03-26 MED ORDER — OXYCODONE HCL ER 10 MG PO T12A
10.0000 mg | EXTENDED_RELEASE_TABLET | Freq: Three times a day (TID) | ORAL | Status: DC
  Administered 2023-03-26: 10 mg via ORAL
  Filled 2023-03-26: qty 1

## 2023-03-26 MED ORDER — ACETAMINOPHEN 325 MG PO TABS
650.0000 mg | ORAL_TABLET | Freq: Four times a day (QID) | ORAL | Status: DC | PRN
  Administered 2023-03-28 – 2023-03-29 (×2): 650 mg via ORAL
  Filled 2023-03-26 (×2): qty 2

## 2023-03-26 MED ORDER — GLIPIZIDE 10 MG PO TABS
10.0000 mg | ORAL_TABLET | Freq: Two times a day (BID) | ORAL | Status: DC
  Filled 2023-03-26: qty 1

## 2023-03-26 MED ORDER — HALOPERIDOL LACTATE 5 MG/ML IJ SOLN: 5.0000 mg | Freq: Three times a day (TID) | INTRAMUSCULAR | Status: DC | PRN

## 2023-03-26 MED ORDER — DULOXETINE HCL 30 MG PO CPEP: 60.0000 mg | ORAL_CAPSULE | Freq: Every day | ORAL | Status: DC

## 2023-03-26 NOTE — Progress Notes (Signed)
Pt calm and cooperative during assessment denying SI/HI/AVH. Pt complaining of Nausea. Pt given PRN medication prior to this writer taking report. Pt compliant with medication administration per MD orders. Pt given education, support, and encouragement to be active in her treatment plan. Pt being monitored Q 15 minutes for safety per unit protocol, remains safe on the unit

## 2023-03-26 NOTE — ED Notes (Signed)
Report given to [facility] in [facility].  Pt understands and is agreeable to inpatient tx

## 2023-03-26 NOTE — ED Notes (Signed)
Pt provided with a snack. Pt sitting up in bed eating.

## 2023-03-26 NOTE — BH Assessment (Signed)
Patient is to be admitted to [facility] on today 03/26/23 by Dr.  Marlou Porch .  Attending Physician will be Dr. Marlou Porch.   Patient has been assigned to room 322, by [facility] Charge Nurse Johnny Bridge.    ER staff is aware of the admission: Marylene Land, ER Secretary   Dr. Roxan Hockey, ER MD  Florentina Addison, Patient's Nurse  Ethelene Browns, Patient Access.

## 2023-03-26 NOTE — Tx Team (Signed)
Initial Treatment Plan 03/26/2023 5:44 PM Terri Woods [id number]    PATIENT STRESSORS: Medication change or noncompliance   Traumatic event     PATIENT STRENGTHS: Supportive family/friends    PATIENT IDENTIFIED PROBLEMS: Generalized anxiety disorder  Suicidal ideation                   DISCHARGE CRITERIA:  Improved stabilization in mood, thinking, and/or behavior  PRELIMINARY DISCHARGE PLAN: Return to previous living arrangement  PATIENT/FAMILY INVOLVEMENT: This treatment plan has been presented to and reviewed with the patient, Terri Woods. The patient has been given the opportunity to ask questions and make suggestions.  Roseanne Reno, RN 03/26/2023, 5:44 PM

## 2023-03-26 NOTE — Consult Note (Signed)
[facility] ED ASSESSMENT   Reason for Consult: Psych Consult Referring Physician: Dr. Anner Crete Patient Identification: Terri Woods MRN:  [id number] ED Chief Complaint: Suicidal ideation  Diagnosis:  Principal Problem:   Suicidal ideation Active Problems:   GAD (generalized anxiety disorder)   ED Assessment Time Calculation: Start Time: 1100 Stop Time: 1140 Total Time in Minutes (Assessment Completion): 40   Subjective: Terri Woods is a 52 y.o. female patient presenting today for head injury and SI.    HPI: Terri Woods, 52 y.o., female patient seen face to face by this provider, consulted with Dr. Marlou Porch; and chart reviewed on 03/26/23.  On evaluation Terri Woods reports that she had a lot of unfortunate accidents this week that caused her to not take her medications for the past 2 days.  She states that she was in the emergency department about 2 days ago due to her horse falling on her head and she was diagnosed with severe concussion.  She states that she was dizzy at that time and could not see, and having headaches, she states that most of those symptoms have subsided but are still clear.  She states "I am a 52 year old woman, and I have never done anything like this before in my life."Patient states that she was having some moments of feeling overwhelmed and stressed, also on feelings of guilt.  She states that due to her concussion, with dizziness and blurred vision, she was unable to help her husband whose car stopped on the side of the road last night, states that he was trying to text her information and she states due to her blurred vision she was unable to see the text messages, and was unable to call and get help for him because she states she had a hard time seeing.  Patient currently denies SI/HI/AVH, she reports that she has never experienced SI or engage in any self-harm behaviors prior to today.  She denies using drugs or alcohol, states she has a psychiatric diagnosis of anxiety,  denies ever being admitted to an inpatient psychiatric facility.  She states that she sees Dr. Howell Rucks at [facility] behavioral health, sees them every other month for therapy and medication management, she states that she is compliant with her therapy appointments and with her medications.  She states that she has been taking her same medication regimen for 15 years and has never missed more than 2 days taking her medications.  Patient states that she does not work she is on disability for chronic back pain and other medical health issues.  Patient currently denies any depression or anxiety, states that she was just feeling down because she was unable to help her husband.  During evaluation patient is sitting on the edge of her bed and appears to be in no acute distress.  She is alert and oriented x 3, calm, cooperative, and tearful at times.  Her mood is sad with congruent flat affect.  She has normal speech, and appropriate behavior. Objectively there is no evidence of psychosis/mania or delusional thinking.  Patient is able to converse coherently, goal directed thoughts, no distractibility, or pre-occupation.  She also denies suicidal/self-harm/homicidal ideation, psychosis, and paranoia.  Patient answered question appropriately.  Patient is a 52 year old Caucasian female with a history of anxiety, per chart review there appears to be no past significant psychiatric history.  Patient does have superficial cuts to her wrist, becomes very tearful saying she has never done anything like this before, states she does not want to  kill herself or anyone else, she endorses writing the letter and stating to her husband that she might not see him anymore, she states that she felt that mother when she hit her head on the bathroom floor and she did not know she does not live or die.  This provider and therapist spoke with patient's husband [name], he reports that he does not believe the patient is a danger to self or  anyone else, states they have been married 13 years and this is the first time that she has ever done anything like this.  He states the patient does have a history of anxiety and being kicked in the head by a horse, and he states not being able to help me has caused her a lot of distress.  Husband states that he would like for the patient to come back home, he states "the house is lonely without her "he is able to confirm the patient does see Dr. Howell Rucks for therapy and medication management. Husband participated in safety planning and agreed to secure sharps.    Past Psychiatric History: Anxiety  Risk to Self or Others: Risk to Self: No Risk to Others:  No  Prior Inpatient Therapy: No Prior Outpatient Therapy: Yes  [place] Scale:  Flowsheet Row ED from 03/25/2023 in [facility] Department at [facility] ED from 03/24/2023 in [facility] at [facility]  ED from 02/09/2023 in [facility] Emergency Department at [facility]  C-SSRS RISK CATEGORY No Risk No Risk No Risk       AIMS:  , , ,  ,   ASAM:    Substance Abuse:     Past Medical History:  Past Medical History:  Diagnosis Date   Arthritis    neck, hips, fingers   Asthma    Back pain    Breast cancer (HCC) 2001   RT LUMPECTOMY PER PT   Carpal tunnel syndrome    right arm   Crohn's disease (HCC)    Diabetes (HCC)    Fibromyalgia    GERD (gastroesophageal reflux disease)    Glaucoma    Headache    Hypertension     Past Surgical History:  Procedure Laterality Date   BILATERAL CARPAL TUNNEL RELEASE Bilateral 02/05/2019   Procedure: BILATERAL CARPAL TUNNEL RELEASE;  Surgeon: Kennedy Bucker, MD;  Location: [facility] ORS;  Service: Orthopedics;  Laterality: Bilateral;   BREAST EXCISIONAL BIOPSY Right 15+ yrs ago   NEG   BREAST SURGERY     CATARACT EXTRACTION W/PHACO Right 10/30/2021   Procedure: CATARACT EXTRACTION PHACO AND INTRAOCULAR LENS PLACEMENT (IOC) RIGHT DIABETIC 1.85 00:20.9;  Surgeon:  Nev[place] Crane, MD;  Location: [facility];  Service: Ophthalmology;  Laterality: Right;  Diabetic   COLONOSCOPY WITH PROPOFOL N/A 03/10/2020   Procedure: COLONOSCOPY WITH PROPOFOL;  Surgeon: Sung Amabile, DO;  Location: [facility] ENDOSCOPY;  Service: General;  Laterality: N/A;   FOOT SURGERY Left    LYMPH NODE BIOPSY     NASAL SEPTOPLASTY W/ TURBINOPLASTY Bilateral 12/25/2018   Procedure: NASAL SEPTOPLASTY WITH INFERIOR TURBINATE REDUCTION;  Surgeon: Vernie Murders, MD;  Location: [facility];  Service: ENT;  Laterality: Bilateral;   SHOULDER SURGERY     SMALL INTESTINE SURGERY     Family History:  Family History  Problem Relation Age of Onset   Heart attack Sister 22   Heart disease Father    Breast cancer Mother    Breast cancer Maternal Aunt  Social History:  Social History   Substance and Sexual Activity  Alcohol Use No   Alcohol/week: 0.0 standard drinks of alcohol     Social History   Substance and Sexual Activity  Drug Use Yes   Types: Oxycodone    Social History   Socioeconomic History   Marital status: Married    Spouse name: Not on file   Number of children: Not on file   Years of education: Not on file   Highest education level: Not on file  Occupational History   Not on file  Tobacco Use   Smoking status: Never   Smokeless tobacco: Never  Vaping Use   Vaping status: Never Used  Substance and Sexual Activity   Alcohol use: No    Alcohol/week: 0.0 standard drinks of alcohol   Drug use: Yes    Types: Oxycodone   Sexual activity: Yes    Birth control/protection: None    Comment: Married  Other Topics Concern   Not on file  Social History Narrative   Not on file   Social Determinants of Health   Financial Resource Strain: Low Risk  (05/22/2022)   [facility]   Overall Financial Resource Strain (CARDIA)    Difficulty of Paying Living Expenses: Not hard at all  Food Insecurity: No Food Insecurity  (02/27/2023)   Received from [facility]   Hunger Vital Sign    Worried About Running Out of Food in the Last Year: Never true    Ran Out of Food in the Last Year: Never true  Transportation Needs: No Transportation Needs (02/27/2023)   Received from [facility]   PRAPARE - Transportation    Lack of Transportation (Medical): No    Lack of Transportation (Non-Medical): No  Physical Activity: Not on file  Stress: Not on file  Social Connections: Not on file      Allergies:   Allergies  Allergen Reactions   Exenatide Anaphylaxis   Levodopa Hives   Lyrica [Pregabalin] Anaphylaxis and Hives   Other Anaphylaxis    Mace    Rotigotine Hives   Erythromycin Rash and Other (See Comments)    GI Upset   Bee Venom Swelling   Fentanyl Itching   Hysingla Er [Hydrocodone Bitartrate Er] Itching   Meloxicam Itching   Doxycycline Rash   Morphine Rash   Penicillins Hives, Nausea And Vomiting and Rash   Semaglutide Hives and Swelling    Hives, diarrhea, vomiting   Shellfish Allergy Rash    Labs:  Results for orders placed or performed during the hospital encounter of 03/25/23 (from the past 48 hour(s))  CBC with Differential     Status: Abnormal   Collection Time: 03/25/23  7:35 PM  Result Value Ref Range   WBC 13.7 (H) 4.0 - 10.5 K/uL   RBC 4.26 3.87 - 5.11 MIL/uL   Hemoglobin 12.1 12.0 - 15.0 g/dL   HCT 29.5 62.1 - 30.8 %   MCV 84.7 80.0 - 100.0 fL   MCH 28.4 26.0 - 34.0 pg   MCHC 33.5 30.0 - 36.0 g/dL   RDW 65.7 84.6 - 96.2 %   Platelets 223 150 - 400 K/uL   nRBC 0.1 0.0 - 0.2 %   Neutrophils Relative % 79 %   Neutro Abs 10.8 (H) 1.7 - 7.7 K/uL   Lymphocytes Relative 13 %   Lymphs Abs 1.7 0.7 - 4.0 K/uL   Monocytes Relative 7 %  Monocytes Absolute 1.0 0.1 - 1.0 K/uL   Eosinophils Relative 0 %   Eosinophils Absolute 0.0 0.0 - 0.5 K/uL   Basophils Relative 0 %   Basophils Absolute 0.0 0.0 - 0.1 K/uL   Immature Granulocytes 1 %   Abs Immature Granulocytes 0.09 (H)  0.00 - 0.07 K/uL    Comment: Performed at [facility], [address], [place], [place] [address]  Basic metabolic panel     Status: Abnormal   Collection Time: 03/25/23  7:35 PM  Result Value Ref Range   Sodium 134 (L) 135 - 145 mmol/L   Potassium 3.8 3.5 - 5.1 mmol/L   Chloride 101 98 - 111 mmol/L   CO2 24 22 - 32 mmol/L   Glucose, Bld 198 (H) 70 - 99 mg/dL    Comment: Glucose reference range applies only to samples taken after fasting for at least 8 hours.   BUN 35 (H) 6 - 20 mg/dL   Creatinine, Ser 6.04 (H) 0.44 - 1.00 mg/dL   Calcium 9.3 8.9 - 54.0 mg/dL   GFR, Estimated >98 >11 mL/min    Comment: (NOTE) Calculated using the CKD-EPI Creatinine Equation (2021)    Anion gap 9 5 - 15    Comment: Performed at [facility], [address] [place], [place] [address]  Salicylate level     Status: Abnormal   Collection Time: 03/25/23  7:35 PM  Result Value Ref Range   Salicylate Lvl <7.0 (L) 7.0 - 30.0 mg/dL    Comment: Performed at [facility], [address] [place], [place] [address]  Acetaminophen level     Status: Abnormal   Collection Time: 03/25/23  7:35 PM  Result Value Ref Range   Acetaminophen (Tylenol), Serum <10 (L) 10 - 30 ug/mL    Comment: (NOTE) Therapeutic concentrations vary significantly. A range of 10-30 ug/mL  may be an effective concentration for many patients. However, some  are best treated at concentrations outside of this range. Acetaminophen concentrations >150 ug/mL at 4 hours after ingestion  and >50 ug/mL at 12 hours after ingestion are often associated with  toxic reactions.  Performed at [facility], [address] [place], [place] [address]   Urine Drug Screen, Qualitative [facility] only)     Status: Abnormal   Collection Time: 03/25/23  7:35 PM  Result Value Ref Range   Tricyclic, Ur Screen POSITIVE (A) NONE DETECTED   Amphetamines, Ur Screen NONE DETECTED NONE DETECTED   MDMA (Ecstasy)Ur Screen NONE  DETECTED NONE DETECTED   Cocaine Metabolite,Ur [place] NONE DETECTED NONE DETECTED   Opiate, Ur Screen POSITIVE (A) NONE DETECTED   Phencyclidine (PCP) Ur S NONE DETECTED NONE DETECTED   Cannabinoid 50 Ng, Ur [place] NONE DETECTED NONE DETECTED   Barbiturates, Ur Screen NONE DETECTED NONE DETECTED   Benzodiazepine, Ur Scrn POSITIVE (A) NONE DETECTED   Methadone Scn, Ur NONE DETECTED NONE DETECTED    Comment: (NOTE) Tricyclics + metabolites, urine    Cutoff 1000 ng/mL Amphetamines + metabolites, urine  Cutoff 1000 ng/mL MDMA (Ecstasy), urine              Cutoff 500 ng/mL Cocaine Metabolite, urine          Cutoff 300 ng/mL Opiate + metabolites, urine        Cutoff 300 ng/mL Phencyclidine (PCP), urine         Cutoff 25 ng/mL Cannabinoid, urine  Cutoff 50 ng/mL Barbiturates + metabolites, urine  Cutoff 200 ng/mL Benzodiazepine, urine              Cutoff 200 ng/mL Methadone, urine                   Cutoff 300 ng/mL  The urine drug screen provides only a preliminary, unconfirmed analytical test result and should not be used for non-medical purposes. Clinical consideration and professional judgment should be applied to any positive drug screen result due to possible interfering substances. A more specific alternate chemical method must be used in order to obtain a confirmed analytical result. Gas chromatography / mass spectrometry (GC/MS) is the preferred confirm atory method. Performed at [facility], [address] [place], [place] [address]   Ethanol     Status: None   Collection Time: 03/25/23  7:35 PM  Result Value Ref Range   Alcohol, Ethyl (B) <10 <10 mg/dL    Comment: (NOTE) Lowest detectable limit for serum alcohol is 10 mg/dL.  For medical purposes only. Performed at [facility], [address] [place], [place] [address]   CBG monitoring, ED     Status: Abnormal   Collection Time: 03/25/23 10:08 PM  Result Value Ref Range    Glucose-Capillary 155 (H) 70 - 99 mg/dL    Comment: Glucose reference range applies only to samples taken after fasting for at least 8 hours.   Comment 1 Notify RN    Comment 2 Document in Chart     No current facility-administered medications for this encounter.   Current Outpatient Medications  Medication Sig Dispense Refill   albuterol (VENTOLIN HFA) 108 (90 Base) MCG/ACT inhaler Inhale 2 puffs into the lungs every 6 (six) hours as needed for wheezing or shortness of breath. (Patient taking differently: Inhale 2 puffs into the lungs every 6 (six) hours as needed for wheezing or shortness of breath. Last used: 6 am.) 8 each 0   atorvastatin (LIPITOR) 20 MG tablet Take 40 mg by mouth daily at 6 PM.     azelastine (ASTELIN) 0.1 % nasal spray Place into both nostrils 2 (two) times daily as needed for rhinitis. Use in each nostril as directed     bimatoprost (LUMIGAN) 0.01 % SOLN Place 1 drop into both eyes at bedtime.     brimonidine (ALPHAGAN) 0.2 % ophthalmic solution Place into both eyes in the morning and at bedtime.     clonazePAM (KLONOPIN) 1 MG tablet Take 1 mg by mouth 3 (three) times daily as needed.     cyclobenzaprine (FLEXERIL) 10 MG tablet Take 10 mg by mouth 3 (three) times daily as needed for muscle spasms.     docusate sodium (COLACE) 100 MG capsule Take 100 mg by mouth 2 (two) times daily as needed for mild constipation.     DULoxetine (CYMBALTA) 30 MG capsule Take 30 mg by mouth at bedtime. Take with 60 mg for total of 90 mg     DULoxetine (CYMBALTA) 60 MG capsule Take 60 mg by mouth at bedtime. Take with 30 mg for total of 90 mg.     EPINEPHrine (EPIPEN 2-PAK) 0.3 mg/0.3 mL IJ SOAJ injection Inject 0.3 mg into the muscle as needed for anaphylaxis. 1 each 1   EPINEPHrine (EPIPEN 2-PAK) 0.3 mg/0.3 mL IJ SOAJ injection Inject 0.3 mg into the muscle as needed. 1 each 1   ergocalciferol (VITAMIN D2) 1.25 MG (50000 UT) capsule Take 50,000 Units by mouth See admin  instructions. Take  1 capsule (50000 units) by mouth once a week or once every other week     glipiZIDE (GLUCOTROL) 5 MG tablet Take 10 mg by mouth 2 (two) times daily.     ibuprofen (ADVIL) 800 MG tablet Take 800 mg by mouth every 8 (eight) hours as needed (pain.).     insulin glargine (LANTUS) 100 UNIT/ML injection Inject 40 Units into the skin at bedtime.     ipratropium (ATROVENT) 0.06 % nasal spray Place 2 sprays into both nostrils 4 (four) times daily. 15 mL 12   ketoconazole (NIZORAL) 2 % shampoo Apply 1 application topically 2 (two) times a week.     ketorolac (TORADOL) 10 MG tablet Take 1 tablet (10 mg total) by mouth every 6 (six) hours as needed. 20 tablet 0   lidocaine (LIDODERM) 5 % Place 2 patches onto the skin daily. Remove & Discard patch within 12 hours or as directed by MD     lidocaine (XYLOCAINE) 5 % ointment Apply 1 application topically as needed (pain.).      lisinopril (ZESTRIL) 2.5 MG tablet Take 5 mg by mouth every evening.     miconazole (MICOTIN) 2 % cream Apply 1 application topically as needed (skin irritation/rash). (Patient not taking: Reported on 03/19/2023)     naloxone (NARCAN) nasal spray 4 mg/0.1 mL Place 1 spray into the nose as needed (accidental overdose).     omeprazole (PRILOSEC) 20 MG capsule Take 20 mg by mouth at bedtime.     ondansetron (ZOFRAN ODT) 8 MG disintegrating tablet Take 1 tablet (8 mg total) by mouth every 8 (eight) hours as needed for nausea or vomiting. 20 tablet 0   Oxycodone HCl 10 MG TABS Take 10 mg by mouth 3 (three) times daily.     oxyCODONE-acetaminophen (PERCOCET/ROXICET) 5-325 MG tablet Take 1 tablet by mouth every 4 (four) hours as needed (pain.).      QUEtiapine (SEROQUEL) 300 MG tablet Take 300 mg by mouth at bedtime.     SUMAtriptan (IMITREX) 100 MG tablet Take 100 mg by mouth every 2 (two) hours as needed for migraine. May repeat in 2 hours if headache persists or recurs.     triamcinolone (NASACORT) 55 MCG/ACT AERO nasal inhaler Place 2 sprays  into the nose daily.     triamcinolone cream (KENALOG) 0.1 % Apply 1 application topically 2 (two) times daily. 30 g 0   valACYclovir (VALTREX) 1000 MG tablet Take 1 tablet (1,000 mg total) by mouth 3 (three) times daily. 21 tablet 0   VICTOZA 18 MG/3ML SOPN SMARTSIG:0.6 Milligram(s) SUB-Q Daily      Musculoskeletal:  Patient observed lying in bed.   Psychiatric Specialty Exam: Presentation  General Appearance:  Appropriate for Environment  Eye Contact: Good  Speech: Clear and Coherent  Speech Volume: Normal  Handedness:No data recorded  Mood and Affect  Mood: Dysphoric; Hopeless  Affect: Appropriate; Depressed   Thought Process  Thought Processes: Coherent  Descriptions of Associations:Intact  Orientation:Full (Time, Place and Person)  Thought Content:WDL  History of Schizophrenia/Schizoaffective disorder:No data recorded Duration of Psychotic Symptoms:No data recorded Hallucinations:Hallucinations: None  Ideas of Reference:None  Suicidal Thoughts:Suicidal Thoughts: No  Homicidal Thoughts:Homicidal Thoughts: No   Sensorium  Memory: Immediate Good; Recent Good  Judgment: Fair  Insight: Fair   Art therapist  Concentration: Fair  Attention Span: Fair  Recall: [employer] of Knowledge: Fair  Language: Fair   Psychomotor Activity  Psychomotor Activity: Psychomotor Activity: Normal   Assets  Assets: Manufacturing systems engineer; Social Support; Intimacy; Housing    Sleep  Sleep: Sleep: Fair   Physical Exam: Physical Exam Vitals and nursing note reviewed. Exam conducted with a chaperone present.  Neurological:     Mental Status: She is alert.  Psychiatric:        Attention and Perception: Attention normal.        Mood and Affect: Mood is anxious and depressed. Affect is flat.        Speech: Speech normal.        Behavior: Behavior is cooperative.        Thought Content: Thought content normal.        Judgment:  Judgment is impulsive and inappropriate.    Review of Systems  Constitutional: Negative.   Psychiatric/Behavioral:  Positive for depression.    Blood pressure 135/68, pulse 96, temperature 97.8 F (36.6 C), temperature source Oral, resp. rate 18, height 5\' 1"  (1.549 m), weight 73.5 kg, SpO2 97%. Body mass index is 30.61 kg/m.   Medical Decision Making: Monitor overnight and reevaluate in the morning, as patient may not need to go to an patient psychiatric facility, as patient has a psychiatrist for medication management, patient has no prior psychiatric history.  Patient also has supportive family, husband who participated in safety planning, and is fine with patient returning home.   Adalynd Donahoe MOTLEY-MANGRUM, PMHNP 03/26/2023 1:47 AM

## 2023-03-26 NOTE — ED Notes (Signed)
Patient currently awake and ambulatory to bathroom.

## 2023-03-26 NOTE — Consult Note (Signed)
[facility] Face-to-Face Psychiatry Consult   Reason for Consult:  SI Referring Physician:  Dr. Janith Lima Patient Identification: Terri Woods MRN:  [id number] Principal Diagnosis: Suicidal ideation Diagnosis:  Principal Problem:   Suicidal ideation Active Problems:   GAD (generalized anxiety disorder)  Total Time spent with patient:  25 minutes  Subjective:   Terri Woods is a 52 y.o. female patient admitted to [facility] on 03/25/23. Per initial triage note,  Pt in from home via EMS, Per EMS patient in with superficial cuts to her right wrist. Patient denies SI or HI at this time. States she hit her head in the bathroom today. C/O dizziness and nausea. EMS provided text message of letter that patient sent to husband stating that she "might not see him anymore, I tried to kill myself tonight".   Pt was seen last night and recommended for overnight assessment with reassessment in the AM.  HPI:   Pt chart reviewed and seen on rounds. Pt reports anxious mood. She is hoping to discharge today. Reports yesterday she felt overwhelmed and cut herself with a knife. Superficial lacerations are noted on pt's right forearm. She reports she obtained a concussion after getting injured by a horse. She has been having headache, dizziness, nausea as a result. Reports yesterday her husband was dropping their granddaughter off when his car broke down. He had called her asking her to call a tow truck company. Reports she was unable to assist and felt frustrated, overwhelmed. Reports she fell over a lamp and cut her foot. She cut herself with a knife. She also fell and hit her head in the shower. Reports she thought she was dying so she wrote her husband a goodbye note and sent him text messages. She reports she has never engaged in non suicidal self injurious behavior before. She has never had a suicide attempt. She has never had an inpatient psychiatric hospitalization. She denies suicidal, homicidal ideations. She  denies auditory visual hallucinations or paranoia. Denies family psychiatric history. Denies alcohol, marijuana, nicotine, crack/cocaine, methamphetamine, other substance use.  Spoke w/ pt's husband, Terri Woods, [phone]. He denies safety concerns with pt discharge today. He states he can monitor pt.   Case staffed w/ attending psychiatrist, Dr. Marval Regal, including review of chart and collateral obtained. Per Dr. Marval Regal, pt to be recommended for inpatient psychiatric admission. Discussed with pt recommendation for inpatient psychiatric admission. Also provided update to pt's husband.   Past Psychiatric History: Anxiety  Risk to Self: Pt denies suicidal ideations Risk to Others: Pt denies homicidal ideations Prior Inpatient Therapy: Pt denies prior inpatient therapy Prior Outpatient Therapy: Pt seeing Dr. Howell Rucks  Past Medical History:  Past Medical History:  Diagnosis Date   Arthritis    neck, hips, fingers   Asthma    Back pain    Breast cancer (HCC) 2001   RT LUMPECTOMY PER PT   Carpal tunnel syndrome    right arm   Crohn's disease (HCC)    Diabetes (HCC)    Fibromyalgia    GERD (gastroesophageal reflux disease)    Glaucoma    Headache    Hypertension     Past Surgical History:  Procedure Laterality Date   BILATERAL CARPAL TUNNEL RELEASE Bilateral 02/05/2019   Procedure: BILATERAL CARPAL TUNNEL RELEASE;  Surgeon: Kennedy Bucker, MD;  Location: [facility] ORS;  Service: Orthopedics;  Laterality: Bilateral;   BREAST EXCISIONAL BIOPSY Right 15+ yrs ago   NEG   BREAST SURGERY     CATARACT EXTRACTION  W/PHACO Right 10/30/2021   Procedure: CATARACT EXTRACTION PHACO AND INTRAOCULAR LENS PLACEMENT (IOC) RIGHT DIABETIC 1.85 00:20.9;  Surgeon: Nevada Crane, MD;  Location: [facility];  Service: Ophthalmology;  Laterality: Right;  Diabetic   COLONOSCOPY WITH PROPOFOL N/A 03/10/2020   Procedure: COLONOSCOPY WITH PROPOFOL;  Surgeon: Sung Amabile, DO;  Location: [facility]  ENDOSCOPY;  Service: General;  Laterality: N/A;   FOOT SURGERY Left    LYMPH NODE BIOPSY     NASAL SEPTOPLASTY W/ TURBINOPLASTY Bilateral 12/25/2018   Procedure: NASAL SEPTOPLASTY WITH INFERIOR TURBINATE REDUCTION;  Surgeon: Vernie Murders, MD;  Location: [facility];  Service: ENT;  Laterality: Bilateral;   SHOULDER SURGERY     SMALL INTESTINE SURGERY     Family History:  Family History  Problem Relation Age of Onset   Heart attack Sister 100   Heart disease Father    Breast cancer Mother    Breast cancer Maternal Aunt    Family Psychiatric  History: None reported Social History:  Social History   Substance and Sexual Activity  Alcohol Use No   Alcohol/week: 0.0 standard drinks of alcohol     Social History   Substance and Sexual Activity  Drug Use Yes   Types: Oxycodone    Social History   Socioeconomic History   Marital status: Married    Spouse name: Not on file   Number of children: Not on file   Years of education: Not on file   Highest education level: Not on file  Occupational History   Not on file  Tobacco Use   Smoking status: Never   Smokeless tobacco: Never  Vaping Use   Vaping status: Never Used  Substance and Sexual Activity   Alcohol use: No    Alcohol/week: 0.0 standard drinks of alcohol   Drug use: Yes    Types: Oxycodone   Sexual activity: Yes    Birth control/protection: None    Comment: Married  Other Topics Concern   Not on file  Social History Narrative   Not on file   Social Determinants of Health   Financial Resource Strain: Low Risk  (05/22/2022)   Received from [facility]   Overall Financial Resource Strain (CARDIA)    Difficulty of Paying Living Expenses: Not hard at all  Food Insecurity: No Food Insecurity (02/27/2023)   Received from [facility]   Hunger Vital Sign    Worried About Running Out of Food in the Last Year: Never true    Ran Out of Food in the Last Year: Never true   Transportation Needs: No Transportation Needs (02/27/2023)   Received from [facility]   PRAPARE - Transportation    Lack of Transportation (Medical): No    Lack of Transportation (Non-Medical): No  Physical Activity: Not on file  Stress: Not on file  Social Connections: Not on file   Additional Social History:    Allergies:   Allergies  Allergen Reactions   Exenatide Anaphylaxis   Levodopa Hives   Lyrica [Pregabalin] Anaphylaxis and Hives   Other Anaphylaxis    Mace    Rotigotine Hives   Erythromycin Rash and Other (See Comments)    GI Upset   Bee Venom Swelling   Fentanyl Itching   Hysingla Er [Hydrocodone Bitartrate Er] Itching   Meloxicam Itching   Doxycycline Rash   Morphine Rash   Penicillins Hives, Nausea And Vomiting and Rash   Semaglutide Hives and Swelling  Hives, diarrhea, vomiting   Shellfish Allergy Rash    Labs:  Results for orders placed or performed during the hospital encounter of 03/25/23 (from the past 48 hour(s))  CBC with Differential     Status: Abnormal   Collection Time: 03/25/23  7:35 PM  Result Value Ref Range   WBC 13.7 (H) 4.0 - 10.5 K/uL   RBC 4.26 3.87 - 5.11 MIL/uL   Hemoglobin 12.1 12.0 - 15.0 g/dL   HCT 01.0 27.2 - 53.6 %   MCV 84.7 80.0 - 100.0 fL   MCH 28.4 26.0 - 34.0 pg   MCHC 33.5 30.0 - 36.0 g/dL   RDW 64.4 03.4 - 74.2 %   Platelets 223 150 - 400 K/uL   nRBC 0.1 0.0 - 0.2 %   Neutrophils Relative % 79 %   Neutro Abs 10.8 (H) 1.7 - 7.7 K/uL   Lymphocytes Relative 13 %   Lymphs Abs 1.7 0.7 - 4.0 K/uL   Monocytes Relative 7 %   Monocytes Absolute 1.0 0.1 - 1.0 K/uL   Eosinophils Relative 0 %   Eosinophils Absolute 0.0 0.0 - 0.5 K/uL   Basophils Relative 0 %   Basophils Absolute 0.0 0.0 - 0.1 K/uL   Immature Granulocytes 1 %   Abs Immature Granulocytes 0.09 (H) 0.00 - 0.07 K/uL    Comment: Performed at [facility], [address] [place], [place] [address]  Basic metabolic panel     Status: Abnormal    Collection Time: 03/25/23  7:35 PM  Result Value Ref Range   Sodium 134 (L) 135 - 145 mmol/L   Potassium 3.8 3.5 - 5.1 mmol/L   Chloride 101 98 - 111 mmol/L   CO2 24 22 - 32 mmol/L   Glucose, Bld 198 (H) 70 - 99 mg/dL    Comment: Glucose reference range applies only to samples taken after fasting for at least 8 hours.   BUN 35 (H) 6 - 20 mg/dL   Creatinine, Ser 8.75 (H) 0.44 - 1.00 mg/dL   Calcium 9.3 8.9 - 64.3 mg/dL   GFR, Estimated >32 >95 mL/min    Comment: (NOTE) Calculated using the CKD-EPI Creatinine Equation (2021)    Anion gap 9 5 - 15    Comment: Performed at [facility], [address] [place], [place] [address]  Salicylate level     Status: Abnormal   Collection Time: 03/25/23  7:35 PM  Result Value Ref Range   Salicylate Lvl <7.0 (L) 7.0 - 30.0 mg/dL    Comment: Performed at [facility], [address] [place], [place] [address]  Acetaminophen level     Status: Abnormal   Collection Time: 03/25/23  7:35 PM  Result Value Ref Range   Acetaminophen (Tylenol), Serum <10 (L) 10 - 30 ug/mL    Comment: (NOTE) Therapeutic concentrations vary significantly. A range of 10-30 ug/mL  may be an effective concentration for many patients. However, some  are best treated at concentrations outside of this range. Acetaminophen concentrations >150 ug/mL at 4 hours after ingestion  and >50 ug/mL at 12 hours after ingestion are often associated with  toxic reactions.  Performed at [facility], [address], [place], [place] [address]   Urine Drug Screen, Qualitative [facility] only)     Status: Abnormal   Collection Time: 03/25/23  7:35 PM  Result Value Ref Range   Tricyclic, Ur Screen POSITIVE (A) NONE DETECTED   Amphetamines, Ur Screen NONE DETECTED NONE DETECTED  MDMA (Ecstasy)Ur Screen NONE DETECTED NONE DETECTED   Cocaine Metabolite,Ur [place] NONE DETECTED NONE DETECTED   Opiate, Ur Screen POSITIVE (A) NONE DETECTED   Phencyclidine (PCP) Ur S  NONE DETECTED NONE DETECTED   Cannabinoid 50 Ng, Ur [place] NONE DETECTED NONE DETECTED   Barbiturates, Ur Screen NONE DETECTED NONE DETECTED   Benzodiazepine, Ur Scrn POSITIVE (A) NONE DETECTED   Methadone Scn, Ur NONE DETECTED NONE DETECTED    Comment: (NOTE) Tricyclics + metabolites, urine    Cutoff 1000 ng/mL Amphetamines + metabolites, urine  Cutoff 1000 ng/mL MDMA (Ecstasy), urine              Cutoff 500 ng/mL Cocaine Metabolite, urine          Cutoff 300 ng/mL Opiate + metabolites, urine        Cutoff 300 ng/mL Phencyclidine (PCP), urine         Cutoff 25 ng/mL Cannabinoid, urine                 Cutoff 50 ng/mL Barbiturates + metabolites, urine  Cutoff 200 ng/mL Benzodiazepine, urine              Cutoff 200 ng/mL Methadone, urine                   Cutoff 300 ng/mL  The urine drug screen provides only a preliminary, unconfirmed analytical test result and should not be used for non-medical purposes. Clinical consideration and professional judgment should be applied to any positive drug screen result due to possible interfering substances. A more specific alternate chemical method must be used in order to obtain a confirmed analytical result. Gas chromatography / mass spectrometry (GC/MS) is the preferred confirm atory method. Performed at [facility], [address] [place], [place] [address]   Ethanol     Status: None   Collection Time: 03/25/23  7:35 PM  Result Value Ref Range   Alcohol, Ethyl (B) <10 <10 mg/dL    Comment: (NOTE) Lowest detectable limit for serum alcohol is 10 mg/dL.  For medical purposes only. Performed at [facility], [address] [place], [place] [address]   CBG monitoring, ED     Status: Abnormal   Collection Time: 03/25/23 10:08 PM  Result Value Ref Range   Glucose-Capillary 155 (H) 70 - 99 mg/dL    Comment: Glucose reference range applies only to samples taken after fasting for at least 8 hours.   Comment 1 Notify RN     Comment 2 Document in Chart   CBG monitoring, ED     Status: Abnormal   Collection Time: 03/26/23  9:06 AM  Result Value Ref Range   Glucose-Capillary 240 (H) 70 - 99 mg/dL    Comment: Glucose reference range applies only to samples taken after fasting for at least 8 hours.    No current facility-administered medications for this encounter.   Current Outpatient Medications  Medication Sig Dispense Refill   albuterol (VENTOLIN HFA) 108 (90 Base) MCG/ACT inhaler Inhale 2 puffs into the lungs every 6 (six) hours as needed for wheezing or shortness of breath. (Patient taking differently: Inhale 2 puffs into the lungs every 6 (six) hours as needed for wheezing or shortness of breath. Last used: 6 am.) 8 each 0   atorvastatin (LIPITOR) 20 MG tablet Take 40 mg by mouth daily at 6 PM.     azelastine (ASTELIN) 0.1 % nasal spray Place into both nostrils 2 (two) times daily as  needed for rhinitis. Use in each nostril as directed     bimatoprost (LUMIGAN) 0.01 % SOLN Place 1 drop into both eyes at bedtime.     brimonidine (ALPHAGAN) 0.2 % ophthalmic solution Place into both eyes in the morning and at bedtime.     clonazePAM (KLONOPIN) 1 MG tablet Take 1 mg by mouth 3 (three) times daily as needed.     cyclobenzaprine (FLEXERIL) 10 MG tablet Take 10 mg by mouth 3 (three) times daily as needed for muscle spasms.     docusate sodium (COLACE) 100 MG capsule Take 100 mg by mouth 2 (two) times daily as needed for mild constipation.     DULoxetine (CYMBALTA) 30 MG capsule Take 30 mg by mouth at bedtime. Take with 60 mg for total of 90 mg     DULoxetine (CYMBALTA) 60 MG capsule Take 60 mg by mouth at bedtime. Take with 30 mg for total of 90 mg.     EPINEPHrine (EPIPEN 2-PAK) 0.3 mg/0.3 mL IJ SOAJ injection Inject 0.3 mg into the muscle as needed for anaphylaxis. 1 each 1   EPINEPHrine (EPIPEN 2-PAK) 0.3 mg/0.3 mL IJ SOAJ injection Inject 0.3 mg into the muscle as needed. 1 each 1   ergocalciferol (VITAMIN D2)  1.25 MG (50000 UT) capsule Take 50,000 Units by mouth See admin instructions. Take 1 capsule (50000 units) by mouth once a week or once every other week     glipiZIDE (GLUCOTROL) 5 MG tablet Take 10 mg by mouth 2 (two) times daily.     ibuprofen (ADVIL) 800 MG tablet Take 800 mg by mouth every 8 (eight) hours as needed (pain.).     insulin glargine (LANTUS) 100 UNIT/ML injection Inject 40 Units into the skin at bedtime.     ipratropium (ATROVENT) 0.06 % nasal spray Place 2 sprays into both nostrils 4 (four) times daily. 15 mL 12   ketoconazole (NIZORAL) 2 % shampoo Apply 1 application topically 2 (two) times a week.     ketorolac (TORADOL) 10 MG tablet Take 1 tablet (10 mg total) by mouth every 6 (six) hours as needed. 20 tablet 0   lidocaine (LIDODERM) 5 % Place 2 patches onto the skin daily. Remove & Discard patch within 12 hours or as directed by MD     lidocaine (XYLOCAINE) 5 % ointment Apply 1 application topically as needed (pain.).      lisinopril (ZESTRIL) 2.5 MG tablet Take 5 mg by mouth every evening.     miconazole (MICOTIN) 2 % cream Apply 1 application topically as needed (skin irritation/rash). (Patient not taking: Reported on 03/19/2023)     naloxone (NARCAN) nasal spray 4 mg/0.1 mL Place 1 spray into the nose as needed (accidental overdose).     omeprazole (PRILOSEC) 20 MG capsule Take 20 mg by mouth at bedtime.     ondansetron (ZOFRAN ODT) 8 MG disintegrating tablet Take 1 tablet (8 mg total) by mouth every 8 (eight) hours as needed for nausea or vomiting. 20 tablet 0   Oxycodone HCl 10 MG TABS Take 10 mg by mouth 3 (three) times daily.     oxyCODONE-acetaminophen (PERCOCET/ROXICET) 5-325 MG tablet Take 1 tablet by mouth every 4 (four) hours as needed (pain.).      QUEtiapine (SEROQUEL) 300 MG tablet Take 300 mg by mouth at bedtime.     SUMAtriptan (IMITREX) 100 MG tablet Take 100 mg by mouth every 2 (two) hours as needed for migraine. May repeat in 2 hours if headache  persists or  recurs.     triamcinolone (NASACORT) 55 MCG/ACT AERO nasal inhaler Place 2 sprays into the nose daily.     triamcinolone cream (KENALOG) 0.1 % Apply 1 application topically 2 (two) times daily. 30 g 0   valACYclovir (VALTREX) 1000 MG tablet Take 1 tablet (1,000 mg total) by mouth 3 (three) times daily. 21 tablet 0   VICTOZA 18 MG/3ML SOPN SMARTSIG:0.6 Milligram(s) SUB-Q Daily     Musculoskeletal: Strength & Muscle Tone: within normal limits Gait & Station: normal Patient leans: N/A  Psychiatric Specialty Exam:  Presentation  General Appearance:  Appropriate for Environment  Eye Contact: Fair  Speech: Clear and Coherent; Normal Rate  Speech Volume: Normal  Handedness: Right   Mood and Affect  Mood: Anxious  Affect: Blunt; Tearful   Thought Process  Thought Processes: Coherent; Goal Directed; Linear  Descriptions of Associations:Intact  Orientation:Full (Time, Place and Person)  Thought Content:Logical  History of Schizophrenia/Schizoaffective disorder:Not applicable Duration of Psychotic Symptoms:Not applicable Hallucinations:Hallucinations: None  Ideas of Reference:None  Suicidal Thoughts:Suicidal Thoughts: No  Homicidal Thoughts:Homicidal Thoughts: No   Sensorium  Memory: Immediate Good  Judgment: Intact  Insight: Present   Executive Functions  Concentration: Fair  Attention Span: Fair  Recall: Fair  Fund of Knowledge: Fair  Language: Fair   Psychomotor Activity  Psychomotor Activity: Psychomotor Activity: Normal   Assets  Assets: Communication Skills; Desire for Improvement; Financial Resources/Insurance; Housing; Intimacy; Leisure Time; Resilience; Social Support   Sleep  Sleep: Sleep: Fair   Physical Exam: Physical Exam Constitutional:      General: She is not in acute distress.    Appearance: She is not ill-appearing, toxic-appearing or diaphoretic.  Eyes:     General: No scleral icterus. Cardiovascular:      Rate and Rhythm: Normal rate.  Pulmonary:     Effort: Pulmonary effort is normal. No respiratory distress.  Skin:    Comments: Superficial lacerations noted on right forearm  Neurological:     Mental Status: She is alert and oriented to person, place, and time.  Psychiatric:        Attention and Perception: Attention and perception normal.        Mood and Affect: Mood is anxious. Affect is blunt and tearful.        Speech: Speech normal.        Behavior: Behavior normal. Behavior is cooperative.        Thought Content: Thought content normal.        Cognition and Memory: Cognition and memory normal.    Review of Systems  Constitutional:  Negative for chills and fever.  Respiratory:  Negative for shortness of breath.   Cardiovascular:  Negative for chest pain and palpitations.  Gastrointestinal:  Negative for abdominal pain.  Neurological:  Positive for headaches.  Psychiatric/Behavioral:  The patient is nervous/anxious.    Blood pressure (!) 141/89, pulse 63, temperature 98 F (36.7 C), temperature source Oral, resp. rate 16, height 5\' 1"  (1.549 m), weight 73.5 kg, SpO2 98%. Body mass index is 30.61 kg/m.  Treatment Plan Summary: 52 y/o female admitted to [facility] on 03/25/23, after NSSIB (cutting), writing note and sending text messages of suicidal nature to her husband. Pt placed under IVC during ED admission. Seen by psychiatry last night and recommended for overnight assessment. On assessment today, pt denies suicidal, homicidal ideations, auditory visual hallucinations or paranoia. Case staffed with attending psychiatrist, Dr. Marval Regal, who is recommending inpatient psychiatric admission.  Disposition: Per Dr. Marval Regal, Recommend  psychiatric Inpatient admission when medically cleared.  Lauree Chandler, NP 03/26/2023 9:50 AM

## 2023-03-26 NOTE — ED Notes (Signed)
Pt is A/Ox 4, Terri Woods declines any SI/HI stated that she is not having A/V hallucinations.  Pt discharged to inpatient at [facility]

## 2023-03-26 NOTE — ED Notes (Signed)
TTS recommended re-eval in the morning.

## 2023-03-26 NOTE — Plan of Care (Signed)
  Problem: Health Behavior/Discharge Planning: Goal: Compliance with treatment plan for underlying cause of condition will improve Outcome: Progressing   Problem: Safety: Goal: Periods of time without injury will increase Outcome: Progressing   

## 2023-03-26 NOTE — Progress Notes (Addendum)
Terri Woods is a 52 year old female admitted involuntarily to BMU from [facility] ED after cutting her right wrist superficially with a knife.   Patient appears tearful and stating she feels very nauseous "I have been throwing up all day." Patient then stated "they said I made myself throw up but I didn't, who would want to throw up on purpose?" Patient also claiming she is sore all over and walking very slow holding on to rails. Patient placed as high fall risk. Patient claims she was in the hospital due to a horse falling on her and hitting her head resulting in a concussion. Patient then claims she felt dizzy and fell in her bathroom. Patient states this incident made her feel very stressed and when her husbands car broke down and she was unable to help, it made her feel extremely guilty. Patient claims she also cut her foot after tripping over a lamp. Patient states this made her feel very overwhelmed and she cut her right wrist. "I dont know I just wanted the pain to end." Patient states she was also off her medication for at least 2 days since she had been in the hospital. Patient states she has plenty of medical issues and is on disability. 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 Patient made aware that her husband is allowed to bring her glasses. Patient also states she has Crohns disease along with arthritis, fibromyalgia, pinched nerves,hypertension, and diabetes type 2. Patient presented with elevated bp and Blood sugar 230. NP notified and patient provided with blood pressure, blood sugar, and nausea medication. Patient currently resting in room. Oriented to unit. A&Ox4. Verbalizes all understanding. Patient currently denies SI,HI, and A/V/H with no plan or intent stating "[place] at the beach right now." "This is the first time I've ever done anything like this." Patient provided with reassurance and assisted with contacting her husband.   BP (!) 170/80 (BP  Location: Left Arm)   Pulse 79   Temp 97.6 F (36.4 C) (Oral)   Resp 18   Ht 5\' 1"  (1.549 m)   Wt 72.9 kg   SpO2 97%   BMI 30.38 kg/m

## 2023-03-26 NOTE — ED Notes (Signed)
Patient in with TTS interview.

## 2023-03-26 NOTE — ED Notes (Signed)
Pt to nurses station asking this EDT about getting her "sleeping meds." This EDT explained to pt that medicines are the nurses responsibility and that she would let her RN know she was asking. Pt also asked to call her husband. This EDT explained to pt that there are phone hours and that she is not allowed to make a phone call at this time.

## 2023-03-26 NOTE — ED Notes (Addendum)
Patient told Nurse tech that she has a fever and is vomiting. Temp is 98.9 at this time. Zofran requested.
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
## 2023-03-26 NOTE — ED Notes (Signed)
Pt had visit from husband.  Pt made statements that she "hasn't had anything to eat or drink all day, not even a cup of water."  Pt has been provided breakfast and lunch.  Pt ate 80% of breakfast and >50% of lunch.

## 2023-03-26 NOTE — ED Notes (Signed)
Pt. Transferred to BHU , room# 4 from ED .Patient was screened by security before entering the unit. Received Report and Recommendations from [place], [place] . Pt. Oriented to unit including Q15 minute rounding as well as locked bathroom protocol, meal / snack schedule, and  the security cameras in place for their protection. Patient is A/O x 4, warm / dry.  Showing no acute signs of distress. Staff to monitor as ordered

## 2023-03-26 NOTE — ED Notes (Signed)
Pt taken to BHU by EDT and security. 1/1 belonging bag sent with. Pt ambulates independently, able to perform ADL's independently, A&OX4.

## 2023-03-26 NOTE — ED Notes (Signed)
Pt refused dinner tray. Pt provided with soda per pt request.
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
## 2023-03-26 NOTE — ED Notes (Signed)
IVC  MOVED  TO  BHU 

## 2023-03-26 NOTE — Group Note (Signed)
Date:  03/26/2023 Time:  6:54 PM  Group Topic/Focus:  Healthy Communication:   The focus of this group is to discuss communication, barriers to communication, as well as healthy ways to communicate with others.  OUTDOOR RECREATION STRUCTURED ACTIVITY     Participation Level:  Did Not Attend   Rosaura Carpenter 03/26/2023, 6:54 PM

## 2023-03-27 DIAGNOSIS — Z79891 Long term (current) use of opiate analgesic: Secondary | ICD-10-CM

## 2023-03-27 DIAGNOSIS — F332 Major depressive disorder, recurrent severe without psychotic features: Secondary | ICD-10-CM | POA: Diagnosis not present

## 2023-03-27 LAB — GLUCOSE, CAPILLARY: Glucose-Capillary: 170 mg/dL — ABNORMAL HIGH (ref 70–99)

## 2023-03-27 LAB — CK: Total CK: 49 U/L (ref 38–234)

## 2023-03-27 MED ORDER — OXYCODONE-ACETAMINOPHEN 5-325 MG PO TABS
1.0000 | ORAL_TABLET | Freq: Three times a day (TID) | ORAL | Status: DC | PRN
  Administered 2023-03-28: 1 via ORAL
  Filled 2023-03-27: qty 1

## 2023-03-27 MED ORDER — CLONAZEPAM 1 MG PO TABS: 1.0000 mg | ORAL_TABLET | Freq: Two times a day (BID) | ORAL | Status: DC | PRN

## 2023-03-27 MED ORDER — OXYCODONE-ACETAMINOPHEN 5-325 MG PO TABS: 1.0000 | ORAL_TABLET | Freq: Four times a day (QID) | ORAL | Status: DC | PRN

## 2023-03-27 MED ORDER — CYCLOBENZAPRINE HCL 5 MG PO TABS
7.5000 mg | ORAL_TABLET | Freq: Three times a day (TID) | ORAL | Status: DC | PRN
  Administered 2023-03-28: 7.5 mg via ORAL
  Filled 2023-03-27 (×2): qty 1.5

## 2023-03-27 NOTE — Progress Notes (Signed)
Patient presents anxious but pleasant. Patient became worried and anxious when learning she was not going home today after treatment team but still remained cooperative. Patient able to talk to husband and managed to calm down. Patient is med compliant and denies SI,HI, and A/V/H with no plan or intent. Patient stated she has cut her toe when the lamp fell on her foot and a visible cut was observed on R toe. Cut was cleansed and toe was wrapped for patients comfort. Patient stated being able to walk much better. Patient eating adequately and reported her nausea from yesterday has subsided. Patient observed spending time outside and socializing appropriately. BP and blood sugar levels improved today compared to yesterday. No s/s of current distress.

## 2023-03-27 NOTE — Group Note (Signed)
Date:  03/27/2023 Time:  6:08 PM  Group Topic/Focus:  Rediscovering Joy:   The focus of this group is to explore various ways to relieve stress in a positive manner. OUTDOOR RECREATION STRUCTURED ACTIVITY    Participation Level:  Active  Participation Quality:  Appropriate  Affect:  Appropriate  Cognitive:  Alert  Insight: Appropriate  Engagement in Group:  Developing/Improving  Modes of Intervention:  Activity  Additional Comments:    Lester Crickenberger 03/27/2023, 6:08 PM
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
## 2023-03-27 NOTE — BHH Suicide Risk Assessment (Signed)
[facility] Admission Suicide Risk Assessment   Nursing information obtained from:  Patient Demographic factors:  Caucasian, Unemployed Current Mental Status:  Suicidal ideation indicated by patient, Self-harm behaviors Loss Factors:  Decline in physical health Historical Factors:  NA Risk Reduction Factors:  Living with another person, especially a relative  Total Time spent with patient: 2 hours Principal Problem: MDD (major depressive disorder) Diagnosis:  Principal Problem:   MDD (major depressive disorder) Active Problems:   Chronic prescription opiate use  Subjective Data: 52 year old Caucasian female, presents following a series of stressful events leading to feelings of overwhelm and transient suicidal ideation (SI). She reports hitting her head twice over the past three days, resulting in symptoms consistent with a concussion, including headache, dizziness, nausea, and blurry vision. She denies loss of consciousness (LOC) during these incidents but states that she believed she was dying after the second fall, which led to writing a goodbye note and cutting herself on the right forearm. She describes these cuts as superficial and impulsive, not intended as self-harm but as a reaction to her perceived medical crisis. The patient acknowledges feeling anxious and overwhelmed, attributing this to recent events involving her husband and granddaughter. She notes a history of chronic back pain managed with OxyContin, Klonopin, and Percocet in the past. She denies a history of psychiatric hospitalizations, non-suicidal self-injurious behaviors, or prior suicide attempts. Additionally, she denies current SI, homicidal ideation (HI), paranoia, or hallucinations but admits to experiencing transient SI during moments of distress.The patient expresses a desire to be discharged today but admits to ongoing anxiety and concussion-related symptoms. She lives with her husband and has a supportive family network. She  denies the use of alcohol, tobacco, or illicit substances and reports no significant family history of psychiatric conditions. Reports normally sleeps "16 hours daily"    CLINICAL FACTORS:   Depression:   Hopelessness Chronic Pain   Musculoskeletal: Strength & Muscle Tone: within normal limits Gait & Station: normal Patient leans: N/A  Psychiatric Specialty Exam:  Presentation  General Appearance:  Appropriate for Environment  Eye Contact: Fair  Speech: Clear and Coherent; Normal Rate  Speech Volume: Normal  Handedness: Right   Mood and Affect  Mood: Anxious  Affect: Blunt; Tearful   Thought Process  Thought Processes: Coherent; Goal Directed; Linear  Descriptions of Associations:Intact  Orientation:Full (Time, Place and Person)  Thought Content:Logical  History of Schizophrenia/Schizoaffective disorder: none noted Duration of Psychotic Symptoms: none noted Hallucinations:Hallucinations: None  Ideas of Reference:None  Suicidal Thoughts:Suicidal Thoughts: No  Homicidal Thoughts:Homicidal Thoughts: No   Sensorium  Memory: Immediate Good  Judgment: Intact  Insight: Present   Executive Functions  Concentration: Fair  Attention Span: Fair  Recall: [employer] of Knowledge: Fair  Language: Fair   Psychomotor Activity  Psychomotor Activity: Psychomotor Activity: Normal   Assets  Assets: Communication Skills; Desire for Improvement; Financial Resources/Insurance; Housing; Intimacy; Leisure Time; Resilience; Social Support   Sleep  Sleep: Sleep: Fair    Physical Exam: Physical Exam Vitals and nursing note reviewed.  HENT:     Head: Normocephalic and atraumatic.     Nose: Nose normal.  Pulmonary:     Effort: Pulmonary effort is normal.  Musculoskeletal:        General: Normal range of motion.     Cervical back: Normal range of motion.  Feet:     Right foot:     Skin integrity: Erythema present.     Comments:  Left great toe Psychiatric:  Attention and Perception: Attention and perception normal.        Mood and Affect: Mood is anxious and depressed. Affect is flat.        Speech: Speech normal.        Behavior: Behavior normal. Behavior is cooperative.        Thought Content: Thought content normal.        Cognition and Memory: Cognition and memory normal.        Judgment: Judgment normal.    Review of Systems  Eyes:  Positive for blurred vision.  Musculoskeletal:  Positive for back pain and joint pain.  Psychiatric/Behavioral:  The patient is nervous/anxious and has insomnia.    Blood pressure 128/73, pulse 87, temperature 98.5 F (36.9 C), resp. rate 18, height 5\' 1"  (1.549 m), weight 72.9 kg, SpO2 98%. Body mass index is 30.38 kg/m.   COGNITIVE FEATURES THAT CONTRIBUTE TO RISK:  Thought constriction (tunnel vision)    SUICIDE RISK:   Mild:  Suicidal ideation of limited frequency, intensity, duration, and specificity.  There are no identifiable plans, no associated intent, mild dysphoria and related symptoms, good self-control (both objective and subjective assessment), few other risk factors, and identifiable protective factors, including available and accessible social support.  PLAN OF CARE: rmar, Recommend psychiatric Inpatient admission when medically cleared.    I certify that inpatient services furnished can reasonably be expected to improve the patient's condition.   [name], NP 03/27/2023, 3:14 PM

## 2023-03-27 NOTE — Progress Notes (Signed)
Pt calm and cooperative during assessment denying SI/HI/AVH. Pt compliant with medication administration per MD orders. Pt given education, support, and encouragement to be active in her treatment plan. Pt being monitored Q 15 minutes for safety per unit protocol, remains safe on the unit

## 2023-03-27 NOTE — Plan of Care (Signed)
 Pt new to the unit today, hasn't had time to progress   Problem: Education: Goal: Knowledge of [facility] Education information/materials will improve Outcome: Not Progressing Goal: Emotional status will improve Outcome: Not Progressing Goal: Mental status will improve Outcome: Not Progressing Goal: Verbalization of understanding the information provided will improve Outcome: Not Progressing   Problem: Activity: Goal: Interest or engagement in activities will improve Outcome: Not Progressing Goal: Sleeping patterns will improve Outcome: Not Progressing   Problem: Coping: Goal: Ability to verbalize frustrations and anger appropriately will improve Outcome: Not Progressing Goal: Ability to demonstrate self-control will improve Outcome: Not Progressing   Problem: Health Behavior/Discharge Planning: Goal: Identification of resources available to assist in meeting health care needs will improve Outcome: Not Progressing Goal: Compliance with treatment plan for underlying cause of condition will improve Outcome: Not Progressing   Problem: Physical Regulation: Goal: Ability to maintain clinical measurements within normal limits will improve Outcome: Not Progressing   Problem: Safety: Goal: Periods of time without injury will increase Outcome: Not Progressing

## 2023-03-27 NOTE — Group Note (Signed)
Date:  03/27/2023 Time:  10:06 PM  Group Topic/Focus:  Wrap-Up Group:   The focus of this group is to help patients review their daily goal of treatment and discuss progress on daily workbooks.    Participation Level:  Active  Participation Quality:  Appropriate and Attentive  Affect:  Appropriate  Cognitive:  Appropriate  Insight: Good  Engagement in Group:  Developing/Improving  Modes of Intervention:  Clarification, Rapport Building, and Support  Additional Comments:     Gerritt Galentine 03/27/2023, 10:06 PM

## 2023-03-27 NOTE — Group Note (Signed)
Recreation Therapy Group Note   Group Topic:Goal Setting  Group Date: 03/27/2023 Start Time: 1030 End Time: 1135 Facilitators: Rosina Lowenstein, LRT, CTRS Location:  Craft Room  Group Description: Vision Boards. Patients were given many different magazines, a glue stick, markers, and a piece of cardstock paper. LRT and pts discussed the importance of having goals in life. LRT and pts discussed the difference between short-term and long-term goals, as well as what a SMART goal is. LRT encouraged pts to create a vision board, with images they picked and then cut out with safety scissors from the magazine, for themselves, that capture their short and long-term goals. LRT encouraged pts to show and explain their vision board to the group.   Goal Area(s) Addressed:  Patient will gain knowledge of short vs. long term goals.  Patient will identify goals for themselves. Patient will practice setting SMART goals. Patient will verbalize their goals to LRT and peers.   Affect/Mood: N/A   Participation Level: Did not attend    Clinical Observations/Individualized Feedback: Arwa did not attend group.   Plan: Continue to engage patient in RT group sessions 2-3x/week.   Rosina Lowenstein, LRT, CTRS 03/27/2023 12:30 PM

## 2023-03-27 NOTE — BH IP Treatment Plan (Signed)
Interdisciplinary Treatment and Diagnostic Plan Update  03/27/2023 Time of Session: 9:58AM Terri Woods MRN: [id number]  Principal Diagnosis: MDD (major depressive disorder)  Secondary Diagnoses: Principal Problem:   MDD (major depressive disorder)   Current Medications:  Current Facility-Administered Medications  Medication Dose Route Frequency Provider Last Rate Last Admin   acetaminophen (TYLENOL) tablet 650 mg  650 mg Oral Q6H PRN Lauree Chandler, NP       alum & mag hydroxide-simeth (MAALOX/MYLANTA) 200-200-20 MG/5ML suspension 30 mL  30 mL Oral Q4H PRN Lauree Chandler, NP       amLODipine (NORVASC) tablet 5 mg  5 mg Oral Daily Rosezetta Schlatter T, MD   5 mg at 03/27/23 0805   atorvastatin (LIPITOR) tablet 40 mg  40 mg Oral Daily Lauree Chandler, NP   40 mg at 03/27/23 0805   brimonidine (ALPHAGAN) 0.2 % ophthalmic solution 1 drop  1 drop Both Eyes BID Lauree Chandler, NP   1 drop at 03/27/23 1324   clonazePAM (KLONOPIN) tablet 2 mg  2 mg Oral BID PRN Lauree Chandler, NP       cyclobenzaprine (FLEXERIL) tablet 10 mg  10 mg Oral TID PRN Lauree Chandler, NP       diphenhydrAMINE (BENADRYL) capsule 50 mg  50 mg Oral TID PRN Lauree Chandler, NP       Or   diphenhydrAMINE (BENADRYL) injection 50 mg  50 mg Intramuscular TID PRN Lauree Chandler, NP       DULoxetine (CYMBALTA) DR capsule 60 mg  60 mg Oral QHS Lewanda Rife, MD   60 mg at 03/26/23 2105   And   DULoxetine (CYMBALTA) DR capsule 30 mg  30 mg Oral QHS Lewanda Rife, MD   30 mg at 03/26/23 2105   glipiZIDE (GLUCOTROL) tablet 10 mg  10 mg Oral BID WC Lauree Chandler, NP   10 mg at 03/27/23 4010   haloperidol (HALDOL) tablet 5 mg  5 mg Oral TID PRN Lauree Chandler, NP       Or   haloperidol lactate (HALDOL) injection 5 mg  5 mg Intramuscular TID PRN Lauree Chandler, NP       insulin glargine-yfgn [facility]) injection 50 Units  50 Units Subcutaneous QHS Lauree Chandler, NP   50  Units at 03/26/23 2106   lisinopril (ZESTRIL) tablet 20 mg  20 mg Oral Daily Rosezetta Schlatter T, MD   20 mg at 03/27/23 0806   LORazepam (ATIVAN) tablet 2 mg  2 mg Oral TID PRN Lauree Chandler, NP       Or   LORazepam (ATIVAN) injection 2 mg  2 mg Intramuscular TID PRN Lauree Chandler, NP       magnesium hydroxide (MILK OF MAGNESIA) suspension 30 mL  30 mL Oral Daily PRN Lauree Chandler, NP       naloxone [facility]) nasal spray 4 mg/0.1 mL  1 spray Nasal PRN Lauree Chandler, NP       ondansetron (ZOFRAN-ODT) disintegrating tablet 4 mg  4 mg Oral Q8H PRN [name], NP   4 mg at 03/26/23 1750   oxyCODONE (OXYCONTIN) 12 hr tablet 10 mg  10 mg Oral Q8H Lauree Chandler, NP   10 mg at 03/27/23 0800   oxyCODONE-acetaminophen (PERCOCET/ROXICET) 5-325 MG per tablet 1 tablet  1 tablet Oral Q4H PRN Lauree Chandler, NP       QUEtiapine (SEROQUEL) tablet 300 mg  300  mg Oral QHS Lauree Chandler, NP   300 mg at 03/26/23 2105   PTA Medications: Medications Prior to Admission  Medication Sig Dispense Refill Last Dose   atorvastatin (LIPITOR) 40 MG tablet Take 40 mg by mouth daily.      brimonidine (ALPHAGAN) 0.2 % ophthalmic solution Place into both eyes in the morning and at bedtime.      clonazePAM (KLONOPIN) 2 MG tablet Take 2 mg by mouth 2 (two) times daily.      cyclobenzaprine (FLEXERIL) 10 MG tablet Take 10 mg by mouth 3 (three) times daily as needed for muscle spasms.      DULoxetine (CYMBALTA) 30 MG capsule Take 30 mg by mouth at bedtime. Take with 60 mg for total of 90 mg      DULoxetine (CYMBALTA) 60 MG capsule Take 60 mg by mouth at bedtime. Take with 30 mg for total of 90 mg.      EPINEPHrine (EPIPEN 2-PAK) 0.3 mg/0.3 mL IJ SOAJ injection Inject 0.3 mg into the muscle as needed. (Patient not taking: Reported on 03/26/2023) 1 each 1    glipiZIDE (GLUCOTROL) 5 MG tablet Take 10 mg by mouth 2 (two) times daily.      insulin glargine (LANTUS) 100 UNIT/ML injection Inject 50  Units into the skin at bedtime.      lisinopril (ZESTRIL) 10 MG tablet Take 10 mg by mouth 2 (two) times daily.      naloxone (NARCAN) nasal spray 4 mg/0.1 mL Place 1 spray into the nose as needed (accidental overdose).      oxyCODONE-acetaminophen (PERCOCET/ROXICET) 5-325 MG tablet Take 1 tablet by mouth every 4 (four) hours as needed (pain.).       OXYCONTIN 10 MG 12 hr tablet Take 10 mg by mouth every 8 (eight) hours.      QUEtiapine (SEROQUEL) 300 MG tablet Take 300 mg by mouth at bedtime.      SUMAtriptan (IMITREX) 100 MG tablet Take 100 mg by mouth every 2 (two) hours as needed for migraine. May repeat in 2 hours if headache persists or recurs.       Patient Stressors: Medication change or noncompliance   Traumatic event    Patient Strengths: Supportive family/friends   Treatment Modalities: Medication Management, Group therapy, Case management,  1 to 1 session with clinician, Psychoeducation, Recreational therapy.   Physician Treatment Plan for Primary Diagnosis: MDD (major depressive disorder) Long Term Goal(s):     Short Term Goals:    Medication Management: Evaluate patient's response, side effects, and tolerance of medication regimen.  Therapeutic Interventions: 1 to 1 sessions, Unit Group sessions and Medication administration.  Evaluation of Outcomes: Not Met  Physician Treatment Plan for Secondary Diagnosis: Principal Problem:   MDD (major depressive disorder)  Long Term Goal(s):     Short Term Goals:       Medication Management: Evaluate patient's response, side effects, and tolerance of medication regimen.  Therapeutic Interventions: 1 to 1 sessions, Unit Group sessions and Medication administration.  Evaluation of Outcomes: Not Met   RN Treatment Plan for Primary Diagnosis: MDD (major depressive disorder) Long Term Goal(s): Knowledge of disease and therapeutic regimen to maintain health will improve  Short Term Goals: Ability to verbalize frustration and  anger appropriately will improve, Ability to demonstrate self-control, Ability to participate in decision making will improve, Ability to verbalize feelings will improve, and Ability to identify and develop effective coping behaviors will improve  Medication Management: RN will administer medications as ordered  by provider, will assess and evaluate patient's response and provide education to patient for prescribed medication. RN will report any adverse and/or side effects to prescribing provider.  Therapeutic Interventions: 1 on 1 counseling sessions, Psychoeducation, Medication administration, Evaluate responses to treatment, Monitor vital signs and CBGs as ordered, Perform/monitor CIWA, COWS, AIMS and Fall Risk screenings as ordered, Perform wound care treatments as ordered.  Evaluation of Outcomes: Not Met   LCSW Treatment Plan for Primary Diagnosis: MDD (major depressive disorder) Long Term Goal(s): Safe transition to appropriate next level of care at discharge, Engage patient in therapeutic group addressing interpersonal concerns.  Short Term Goals: Engage patient in aftercare planning with referrals and resources, Increase ability to appropriately verbalize feelings, Increase emotional regulation, Facilitate acceptance of mental health diagnosis and concerns, Facilitate patient progression through stages of change regarding substance use diagnoses and concerns, and Increase skills for wellness and recovery  Therapeutic Interventions: Assess for all discharge needs, 1 to 1 time with Social worker, Explore available resources and support systems, Assess for adequacy in community support network, Educate family and significant other(s) on suicide prevention, Complete Psychosocial Assessment, Interpersonal group therapy.  Evaluation of Outcomes: Not Met   Progress in Treatment: Attending groups: Yes. and No. Participating in groups: Yes. and No. Taking medication as prescribed:  Yes. Toleration medication: Yes. Family/Significant other contact made: No, will contact:  CSW will contact once permission is granted.  Patient understands diagnosis: No. Discussing patient identified problems/goals with staff: Yes. Medical problems stabilized or resolved: Yes. Denies suicidal/homicidal ideation: Yes. Issues/concerns per patient self-inventory: No. Other: None  New problem(s) identified: Yes, Describe:  Patient is struggling to understand why she is here and is coming to terms with her mental health.    New Short Term/Long Term Goal(s):detox, elimination of symptoms of psychosis, medication management for mood stabilization; elimination of SI thoughts; development of comprehensive mental wellness/sobriety plan.    Patient Goals:  "To never do this again."  Discharge Plan or Barriers: CSW to assist in the development of the appropriate discharge plan.   Reason for Continuation of Hospitalization: Anxiety Delusions  Depression Suicidal ideation  Estimated Length of Stay:1-7 days.   Last 3 [place] Suicide Severity Risk Score: Flowsheet Row Admission (Current) from 03/26/2023 in [facility] INPATIENT BEHAV[facility]AL MEDICINE ED from 03/25/2023 [facility] Department at [facility] ED from 03/24/2023 in [facility] at [facility]   C-SSRS RISK CATEGORY Low Risk No Risk No Risk       Last PHQ 2/9 Scores:    03/19/2023   11:32 AM  Depression screen PHQ 2/9  Decreased Interest 0  Down, Depressed, Hopeless 0  PHQ - 2 Score 0    Scribe for Treatment Team: Lowry Ram, LCSW 03/27/2023 2:00 PM

## 2023-03-27 NOTE — Group Note (Signed)
Date:  03/27/2023 Time:  10:29 AM  Group Topic/Focus:  Goals Group:   The focus of this group is to help patients establish daily goals to achieve during treatment and discuss how the patient can incorporate goal setting into their daily lives to aide in recovery.    Participation Level:  Active  Participation Quality:  Appropriate  Affect:  Appropriate  Cognitive:  Appropriate  Insight: Appropriate  Engagement in Group:  Engaged  Modes of Intervention:  Discussion, Education, and Support  Additional Comments:    Wilford Corner 03/27/2023, 10:29 AM

## 2023-03-27 NOTE — Inpatient Diabetes Management (Signed)
Inpatient Diabetes Program Recommendations  AACE/ADA: New Consensus Statement on Inpatient Glycemic Control (2015)  Target Ranges:  Prepandial:   less than 140 mg/dL      Peak postprandial:   less than 180 mg/dL (1-2 hours)      Critically ill patients:  140 - 180 mg/dL    Latest Reference Range & Units 03/26/23 09:06 03/26/23 17:18 03/27/23 07:53  Glucose-Capillary 70 - 99 mg/dL 244 (H) 010 (H)  50 units Semglee @2106  170 (H)  (H): Data is abnormally high    Admit with: Suicide Attempt / Elevated BP/ Headache  History: DM2  Home DM Meds: Lantus 50 units at bedtime       Glipizide 10 mg BID  Current Orders: Semglee 50 units at bedtime      Glipizide 10 mg BID     MD- Please add Novolog Moderate Correction Scale/ SSI (0-15 units) TID AC + HS (Use Glycemic Control Order set)    --Will follow patient during hospitalization--  Ambrose Finland RN, MSN, CDCES Diabetes Coordinator Inpatient Glycemic Control Team Team Pager: [phone] (8a-5p)

## 2023-03-27 NOTE — H&P (Signed)
Psychiatric Admission Assessment Adult  Patient Identification: Terri Woods MRN:  [id number] Date of Evaluation:  03/27/2023 Chief Complaint:  MDD (major depressive disorder) [F32.9] Principal Diagnosis: MDD (major depressive disorder) Diagnosis:  Principal Problem:   MDD (major depressive disorder) Active Problems:   Chronic prescription opiate use  History of Present Illness: 52 year old Caucasian female presenting with complaints of head trauma, ongoing symptoms related to a recent concussion, and transient suicidal ideation (SI). The patient reports hitting her head twice over the past three days, with the most recent injury occurring in the shower. She states she experienced blurry vision, headache, dizziness, and nausea following the incidents but denies any loss of consciousness (LOC). These symptoms have persisted since the injuries. The patient was evaluated at the emergency department the previous day, where imaging and lab work ruled out acute traumatic injuries.She reports feeling overwhelmed yesterday due to family-related stressors, including her husband asking for assistance while she was unable to provide help. This resulted in frustration and emotional distress, during which she cut herself superficially on her right forearm using a knife. She also sent text messages to her husband expressing that she wanted to die and wrote him a goodbye note, believing she was dying after hitting her head. The patient denies a history of non-suicidal self-injurious behavior or previous suicide attempts.At present, the patient denies active suicidal or homicidal ideation (SI/HI) and reports feeling anxious. She attributes her emotional distress to recent stressors and the physical discomfort associated with her injuries. She denies auditory or visual hallucinations, delusions, paranoia, or substance use. Her family history is non-contributory for psychiatric conditions.The patient describes a history  of chronic back pain managed previously with OxyContin, Percocet, and Klonopin. She denies current alcohol, nicotine, or illicit drug use. Her emotional and physical symptoms have caused significant distress but do not meet the threshold for current inpatient psychiatric hospitalization criteria. Associated Signs/Symptoms: Depression Symptoms:  insomnia, fatigue, difficulty concentrating, recurrent thoughts of death, panic attacks, (Hypo) Manic Symptoms:  Irritable Mood, Anxiety Symptoms:  Excessive Worry, Psychotic Symptoms:   none noted PTSD Symptoms: Negative Total Time spent with patient: 3 hours  Past Psychiatric History: Depression, Anxiety  Is the patient at risk to self? Yes.    Has the patient been a risk to self in the past 6 months? No.  Has the patient been a risk to self within the distant past? No.  Is the patient a risk to others? No.  Has the patient been a risk to others in the past 6 months? No.  Has the patient been a risk to others within the distant past? No.   [place] Scale:  Flowsheet Row Admission (Current) from 03/26/2023 in [facility] INPATIENT BEHAVIORAL MEDICINE ED from 03/25/2023 in [facility] Emergency Department at [facility] ED from 03/24/2023 in [facility] at [facility]   C-SSRS RISK CATEGORY Low Risk No Risk No Risk        Prior Inpatient Therapy: No. If yes, describe none  Prior Outpatient Therapy: No. If yes, describe PCP give her medication   Alcohol Screening: 1. How often do you have a drink containing alcohol?: Never 2. How many drinks containing alcohol do you have on a typical day when you are drinking?: 1 or 2 3. How often do you have six or more drinks on one occasion?: Never AUDIT-C Score: 0 4. How often during the last year have you found that you were not able to stop drinking once you had started?: Never 5. How often  during the last year have you failed to do what was normally expected from you because of drinking?:  Never 6. How often during the last year have you needed a first drink in the morning to get yourself going after a heavy drinking session?: Never 7. How often during the last year have you had a feeling of guilt of remorse after drinking?: Never 8. How often during the last year have you been unable to remember what happened the night before because you had been drinking?: Never 9. Have you or someone else been injured as a result of your drinking?: No 10. Has a relative or friend or a doctor or another health worker been concerned about your drinking or suggested you cut down?: No Alcohol Use Disorder Identification Test Final Score (AUDIT): 0 Alcohol Brief Interventions/Follow-up:  (n/a) Substance Abuse History in the last 12 months:  No. Consequences of Substance Abuse: Negative Previous Psychotropic Medications: Yes  Psychological Evaluations: No  Past Medical History:  Past Medical History:  Diagnosis Date   Arthritis    neck, hips, fingers   Asthma    Back pain    Breast cancer (HCC) 2001   RT LUMPECTOMY PER PT   Carpal tunnel syndrome    right arm   Crohn's disease (HCC)    Diabetes (HCC)    Fibromyalgia    GERD (gastroesophageal reflux disease)    Glaucoma    Headache    Hypertension     Past Surgical History:  Procedure Laterality Date   BILATERAL CARPAL TUNNEL RELEASE Bilateral 02/05/2019   Procedure: BILATERAL CARPAL TUNNEL RELEASE;  Surgeon: Kennedy Bucker, MD;  Location: [facility] ORS;  Service: Orthopedics;  Laterality: Bilateral;   BREAST EXCISIONAL BIOPSY Right 15+ yrs ago   NEG   BREAST SURGERY     CATARACT EXTRACTION W/PHACO Right 10/30/2021   Procedure: CATARACT EXTRACTION PHACO AND INTRAOCULAR LENS PLACEMENT (IOC) RIGHT DIABETIC 1.85 00:20.9;  Surgeon: Nevada Crane, MD;  Location: [facility];  Service: Ophthalmology;  Laterality: Right;  Diabetic   COLONOSCOPY WITH PROPOFOL N/A 03/10/2020   Procedure: COLONOSCOPY WITH PROPOFOL;  Surgeon: Sung Amabile, DO;  Location: [facility] ENDOSCOPY;  Service: General;  Laterality: N/A;   FOOT SURGERY Left    LYMPH NODE BIOPSY     NASAL SEPTOPLASTY W/ TURBINOPLASTY Bilateral 12/25/2018   Procedure: NASAL SEPTOPLASTY WITH INFERIOR TURBINATE REDUCTION;  Surgeon: Vernie Murders, MD;  Location: [facility];  Service: ENT;  Laterality: Bilateral;   SHOULDER SURGERY     SMALL INTESTINE SURGERY     Family History:  Family History  Problem Relation Age of Onset   Heart attack Sister 30   Heart disease Father    Breast cancer Mother    Breast cancer Maternal Aunt    Family Psychiatric  History: none noted Tobacco Screening:  Social History   Tobacco Use  Smoking Status Never  Smokeless Tobacco Never    BH Tobacco Counseling     Are you interested in Tobacco Cessation Medications?  N/A, patient does not use tobacco products Counseled patient on smoking cessation:  N/A, patient does not use tobacco products Reason Tobacco Screening Not Completed: No value filed.       Social History:  Social History   Substance and Sexual Activity  Alcohol Use No   Alcohol/week: 0.0 standard drinks of alcohol     Social History   Substance and Sexual Activity  Drug Use Yes   Types: Oxycodone    Additional Social  History: Marital status: Married Number of Years Married: 7 What types of issues is patient dealing with in the relationship?: pt denies, "we get along perfect" Are you sexually active?: Yes What is your sexual orientation?: heterosexual Has your sexual activity been affected by drugs, alcohol, medication, or emotional stress?: no Does patient have children?: Yes How many children?: 7 How is patient's relationship with their children?: seven step children from husband--good relationships                         Allergies:   Allergies  Allergen Reactions   Exenatide Anaphylaxis   Levodopa Hives   Lyrica [Pregabalin] Anaphylaxis and Hives   Other Anaphylaxis    Mace     Rotigotine Hives   Erythromycin Rash and Other (See Comments)    GI Upset   Bee Venom Swelling   Fentanyl Itching   Hysingla Er [Hydrocodone Bitartrate Er] Itching   Meloxicam Itching   Doxycycline Rash   Morphine Rash   Penicillins Hives, Nausea And Vomiting and Rash   Semaglutide Hives and Swelling    Hives, diarrhea, vomiting   Shellfish Allergy Rash   Lab Results:  Results for orders placed or performed during the hospital encounter of 03/26/23 (from the past 48 hour(s))  Glucose, capillary     Status: Abnormal   Collection Time: 03/26/23  5:18 PM  Result Value Ref Range   Glucose-Capillary 230 (H) 70 - 99 mg/dL    Comment: Glucose reference range applies only to samples taken after fasting for at least 8 hours.  CK     Status: None   Collection Time: 03/27/23  6:51 AM  Result Value Ref Range   Total CK 49 38 - 234 U/L    Comment: Performed at [facility], [address] [place], [place] [address]  Glucose, capillary     Status: Abnormal   Collection Time: 03/27/23  7:53 AM  Result Value Ref Range   Glucose-Capillary 170 (H) 70 - 99 mg/dL    Comment: Glucose reference range applies only to samples taken after fasting for at least 8 hours.    Blood Alcohol level:  Lab Results  Component Value Date   ETH <10 03/25/2023      Current Medications: Current Facility-Administered Medications  Medication Dose Route Frequency Provider Last Rate Last Admin   acetaminophen (TYLENOL) tablet 650 mg  650 mg Oral Q6H PRN Lauree Chandler, NP       alum & mag hydroxide-simeth (MAALOX/MYLANTA) 200-200-20 MG/5ML suspension 30 mL  30 mL Oral Q4H PRN Lauree Chandler, NP       amLODipine (NORVASC) tablet 5 mg  5 mg Oral Daily Rosezetta Schlatter T, MD   5 mg at 03/27/23 0805   atorvastatin (LIPITOR) tablet 40 mg  40 mg Oral Daily Lauree Chandler, NP   40 mg at 03/27/23 0805   brimonidine (ALPHAGAN) 0.2 % ophthalmic solution 1 drop  1 drop Both Eyes BID Lauree Chandler, NP   1 drop at 03/27/23 0865   clonazePAM (KLONOPIN) tablet 1 mg  1 mg Oral BID PRN [name], NP       cyclobenzaprine (FLEXERIL) tablet 7.5 mg  7.5 mg Oral TID PRN [name], NP       diphenhydrAMINE (BENADRYL) capsule 50 mg  50 mg Oral TID PRN Lauree Chandler, NP       Or   diphenhydrAMINE (BENADRYL) injection 50  mg  50 mg Intramuscular TID PRN Lauree Chandler, NP       DULoxetine (CYMBALTA) DR capsule 60 mg  60 mg Oral QHS Lewanda Rife, MD   60 mg at 03/26/23 2105   And   DULoxetine (CYMBALTA) DR capsule 30 mg  30 mg Oral QHS Lewanda Rife, MD   30 mg at 03/26/23 2105   glipiZIDE (GLUCOTROL) tablet 10 mg  10 mg Oral BID WC Lauree Chandler, NP   10 mg at 03/27/23 1641   haloperidol (HALDOL) tablet 5 mg  5 mg Oral TID PRN Lauree Chandler, NP       Or   haloperidol lactate (HALDOL) injection 5 mg  5 mg Intramuscular TID PRN Lauree Chandler, NP       insulin glargine-yfgn [facility]) injection 50 Units  50 Units Subcutaneous QHS Lauree Chandler, NP   50 Units at 03/26/23 2106   lisinopril (ZESTRIL) tablet 20 mg  20 mg Oral Daily Rosezetta Schlatter T, MD   20 mg at 03/27/23 4098   LORazepam (ATIVAN) tablet 2 mg  2 mg Oral TID PRN Lauree Chandler, NP       Or   LORazepam (ATIVAN) injection 2 mg  2 mg Intramuscular TID PRN Lauree Chandler, NP       magnesium hydroxide (MILK OF MAGNESIA) suspension 30 mL  30 mL Oral Daily PRN Lauree Chandler, NP       naloxone [facility]) nasal spray 4 mg/0.1 mL  1 spray Nasal PRN Lauree Chandler, NP       ondansetron (ZOFRAN-ODT) disintegrating tablet 4 mg  4 mg Oral Q8H PRN [name], NP   4 mg at 03/26/23 1750   oxyCODONE (OXYCONTIN) 12 hr tablet 10 mg  10 mg Oral Q8H Lauree Chandler, NP   10 mg at 03/27/23 1535   oxyCODONE-acetaminophen (PERCOCET/ROXICET) 5-325 MG per tablet 1 tablet  1 tablet Oral Q6H PRN [name], NP       QUEtiapine (SEROQUEL) tablet 300 mg  300 mg Oral QHS Lauree Chandler,  NP   300 mg at 03/26/23 2105   PTA Medications: Medications Prior to Admission  Medication Sig Dispense Refill Last Dose   atorvastatin (LIPITOR) 40 MG tablet Take 40 mg by mouth daily.      brimonidine (ALPHAGAN) 0.2 % ophthalmic solution Place into both eyes in the morning and at bedtime.      clonazePAM (KLONOPIN) 2 MG tablet Take 2 mg by mouth 2 (two) times daily.      cyclobenzaprine (FLEXERIL) 10 MG tablet Take 10 mg by mouth 3 (three) times daily as needed for muscle spasms.      DULoxetine (CYMBALTA) 30 MG capsule Take 30 mg by mouth at bedtime. Take with 60 mg for total of 90 mg      DULoxetine (CYMBALTA) 60 MG capsule Take 60 mg by mouth at bedtime. Take with 30 mg for total of 90 mg.      EPINEPHrine (EPIPEN 2-PAK) 0.3 mg/0.3 mL IJ SOAJ injection Inject 0.3 mg into the muscle as needed. (Patient not taking: Reported on 03/26/2023) 1 each 1    glipiZIDE (GLUCOTROL) 5 MG tablet Take 10 mg by mouth 2 (two) times daily.      insulin glargine (LANTUS) 100 UNIT/ML injection Inject 50 Units into the skin at bedtime.      lisinopril (ZESTRIL) 10 MG tablet Take 10 mg by mouth 2 (two) times daily.  naloxone (NARCAN) nasal spray 4 mg/0.1 mL Place 1 spray into the nose as needed (accidental overdose).      oxyCODONE-acetaminophen (PERCOCET/ROXICET) 5-325 MG tablet Take 1 tablet by mouth every 4 (four) hours as needed (pain.).       OXYCONTIN 10 MG 12 hr tablet Take 10 mg by mouth every 8 (eight) hours.      QUEtiapine (SEROQUEL) 300 MG tablet Take 300 mg by mouth at bedtime.      SUMAtriptan (IMITREX) 100 MG tablet Take 100 mg by mouth every 2 (two) hours as needed for migraine. May repeat in 2 hours if headache persists or recurs.       Musculoskeletal: Strength & Muscle Tone: within normal limits Gait & Station: normal Patient leans: N/A            Psychiatric Specialty Exam:  Presentation  General Appearance:  Appropriate for Environment  Eye  Contact: Minimal  Speech: Clear and Coherent; Normal Rate  Speech Volume: Normal  Handedness: Right   Mood and Affect  Mood: Irritable; Depressed  Affect: Congruent; Appropriate   Thought Process  Thought Processes: Coherent; Linear  Duration of Psychotic Symptoms:N/A Past Diagnosis of Schizophrenia or Psychoactive disorder: 2 weeks Descriptions of Associations:Intact  Orientation:Full (Time, Place and Person)  Thought Content:WDL  Hallucinations:Hallucinations: None  Ideas of Reference:None  Suicidal Thoughts:Suicidal Thoughts: No  Homicidal Thoughts:Homicidal Thoughts: No   Sensorium  Memory: Immediate Good  Judgment: Poor  Insight: Poor   Executive Functions  Concentration: Fair  Attention Span: Fair  Recall: Good  Fund of Knowledge: Good  Language: Good   Psychomotor Activity  Psychomotor Activity: Psychomotor Activity: Normal   Assets  Assets: Communication Skills; Housing; Health and safety inspector; Desire for Improvement   Sleep  Sleep: Sleep: Fair Number of Hours of Sleep: 8    Physical Exam: Physical Exam Vitals reviewed.  Constitutional:      Appearance: Normal appearance.  HENT:     Head: Normocephalic and atraumatic.     Nose: Nose normal.  Pulmonary:     Effort: Pulmonary effort is normal.  Musculoskeletal:        General: Normal range of motion.     Cervical back: Normal range of motion.  Neurological:     General: No focal deficit present.     Mental Status: She is alert and oriented to person, place, and time.    ROS Blood pressure 128/73, pulse 87, temperature 98.5 F (36.9 C), resp. rate 18, height 5\' 1"  (1.549 m), weight 72.9 kg, SpO2 98%. Body mass index is 30.38 kg/m.  Treatment Plan Summary: Daily contact with patient to assess and evaluate symptoms and progress in treatment and Medication management DULoxetine (Cymbalta) 90mg  nightly QUEtiapine (Seroquel): 300 mg, oral, at  bedtime ClonazePAM (Klonopin): 1 mg, oral, BID PRN AmLODipine (Norvasc): 5 mg, oral, daily Atorvastatin (Lipitor): 40 mg, oral, daily Lisinopril (Zestril): 20 mg, oral, daily GlipiZIDE (Glucotrol): 10 mg, oral, BID1 Insulin glargine (Semglee): 50 Units, subcutaneous, QHS Naloxone (Narcan): 4 mg/0.1 mL, nasal spray, PRN Brimonidine (Alphagan): 1 drop, both eyes, BID Delay discharge to further assess stability and ensure the patient has a clear, structured plan for managing stressors. Reassess readiness for discharge within 24-48 hours, pending resolution of acute symptoms and implementation of a safety plan. Discuss the patient's reported history of using OxyContin, Klonopin, and Percocet for back pain to evaluate current needs and ensure safe prescribing practices.  Observation Level/Precautions:  Continuous Observation Fall 15 minute checks Seizure  Laboratory:  Psychotherapy:    Medications:    Consultations:    Discharge Concerns:    Estimated LOS:  Other:     Physician Treatment Plan for Primary Diagnosis: MDD (major depressive disorder) Long Term Goal(s): Improvement in symptoms so as ready for discharge  Short Term Goals: Ability to identify changes in lifestyle to reduce recurrence of condition will improve, Ability to verbalize feelings will improve, Ability to disclose and discuss suicidal ideas, Ability to demonstrate self-control will improve, Ability to identify and develop effective coping behaviors will improve, Ability to maintain clinical measurements within normal limits will improve, Compliance with prescribed medications will improve, and Ability to identify triggers associated with substance abuse/mental health issues will improve  Physician Treatment Plan for Secondary Diagnosis: Principal Problem:   MDD (major depressive disorder) Active Problems:   Chronic prescription opiate use  Long Term Goal(s): Improvement in symptoms so as ready for discharge  Short  Term Goals: Ability to identify changes in lifestyle to reduce recurrence of condition will improve, Ability to verbalize feelings will improve, Ability to disclose and discuss suicidal ideas, Ability to demonstrate self-control will improve, Ability to identify and develop effective coping behaviors will improve, Ability to maintain clinical measurements within normal limits will improve, Compliance with prescribed medications will improve, and Ability to identify triggers associated with substance abuse/mental health issues will improve  I certify that inpatient services furnished can reasonably be expected to improve the patient's condition.    [name], NP 11/13/20244:59 PM

## 2023-03-27 NOTE — B[facility] Group Notes (Signed)
LCSW Wellness Group Note   03/27/2023 1:00pm  Type of Group and Topic: Psychoeducational Group:  Wellness  Participation Level:  moderate  Description of Group  Wellness group introduces the topic and its focus on developing healthy habits across the spectrum and its relationship to a decrease in hospital admissions.  Six areas of wellness are discussed: physical, social spiritual, intellectual, occupational, and emotional.  Patients are asked to consider their current wellness habits and to identify areas of wellness where they are interested and able to focus on improvements.    Therapeutic Goals Patients will understand components of wellness and how they can positively impact overall health.  Patients will identify areas of wellness where they have developed good habits. Patients will identify areas of wellness where they would like to make improvements.    Summary of Patient Progress: Pt attentive during group, did make comment during discussion.  Pt identified financial and spiritual as wellness areas of strength and physical as wellness area that needs improvement.       Therapeutic Modalities: Cognitive Behavioral Therapy Psychoeducation    Lorri Frederick, LCSW

## 2023-03-27 NOTE — Plan of Care (Signed)
  Problem: Health Behavior/Discharge Planning: Goal: Compliance with treatment plan for underlying cause of condition will improve Outcome: Progressing   Problem: Physical Regulation: Goal: Ability to maintain clinical measurements within normal limits will improve Outcome: Progressing   Problem: Safety: Goal: Periods of time without injury will increase Outcome: Progressing   

## 2023-03-28 LAB — GLUCOSE, CAPILLARY: Glucose-Capillary: 267 mg/dL — ABNORMAL HIGH (ref 70–99)

## 2023-03-28 MED ORDER — TRIPLE ANTIBIOTIC 3.5-400-5000 EX OINT
1.0000 | TOPICAL_OINTMENT | Freq: Two times a day (BID) | CUTANEOUS | Status: DC | PRN
  Administered 2023-03-28: 1 via CUTANEOUS
  Filled 2023-03-28 (×2): qty 1

## 2023-03-28 MED ORDER — ASPIRIN 81 MG PO TBEC
81.0000 mg | DELAYED_RELEASE_TABLET | Freq: Every day | ORAL | Status: DC
  Administered 2023-03-28 – 2023-03-30 (×3): 81 mg via ORAL
  Filled 2023-03-28 (×3): qty 1

## 2023-03-28 MED ORDER — DOCUSATE SODIUM 100 MG PO CAPS
100.0000 mg | ORAL_CAPSULE | Freq: Every day | ORAL | Status: DC
  Administered 2023-03-28 – 2023-03-30 (×3): 100 mg via ORAL
  Filled 2023-03-28 (×3): qty 1

## 2023-03-28 MED ORDER — POLYETHYLENE GLYCOL 3350 17 G PO PACK: 17.0000 g | PACK | Freq: Every day | ORAL | Status: DC | PRN

## 2023-03-28 NOTE — Group Note (Signed)
LCSW Group Therapy Note  Group Date: 03/28/2023 Start Time: 1100 End Time: 1200   Type of Therapy and Topic:  Group Therapy - Healthy vs Unhealthy Coping Skills  Participation Level:  Did Not Attend   Description of Group The focus of this group was to determine what unhealthy coping techniques typically are used by group members and what healthy coping techniques would be helpful in coping with various problems. Patients were guided in becoming aware of the differences between healthy and unhealthy coping techniques. Patients were asked to identify 2-3 healthy coping skills they would like to learn to use more effectively.  Therapeutic Goals Patients learned that coping is what human beings do all day long to deal with various situations in their lives Patients defined and discussed healthy vs unhealthy coping techniques Patients identified their preferred coping techniques and identified whether these were healthy or unhealthy Patients determined 2-3 healthy coping skills they would like to become more familiar with and use more often. Patients provided support and ideas to each other   Summary of Patient Progress:  Did not attend   Therapeutic Modalities Cognitive Behavioral Therapy Motivational Interviewing  Marinda Elk, [place] 03/28/2023  1:39 PM

## 2023-03-28 NOTE — Group Note (Signed)
Recreation Therapy Group Note   Group Topic:Relaxation  Group Date: 03/28/2023 Start Time: 1000 End Time: 1045 Facilitators: Rosina Lowenstein, LRT, CTRS Location:  Craft Room  Group Description: PMR (Progressive Muscle Relaxation). LRT asks patients their current level of stress/anxiety from 1-10, with 10 being the highest. LRT educates patients on what PMR is and the benefits that come from it. Patients are asked to sit with their feet flat on the floor while sitting up and all the way back in their chair, if possible. LRT and pts follow a prompt through a speaker that requires you to tense and release different muscles in their body and focus on their breathing. During session, lights are off and soft music is being played. Pts are given a stress ball to use if needed. At the end of the prompt, LRT asks patients to rank their current levels of stress/anxiety from 1-10, 10 being the highest. LRT provides patients with an education handout on PMR.   Goal Area(s) Addressed:  Patients will be able to describe progressive muscle relaxation.  Patient will practice using relaxation technique. Patient will identify a new coping skill.  Patient will follow multistep directions to reduce anxiety and stress.   Affect/Mood: Appropriate   Participation Level: Active and Engaged   Participation Quality: Independent   Behavior: Calm and Cooperative   Speech/Thought Process: Coherent   Insight: Good   Judgement: Good   Modes of Intervention: Activity   Patient Response to Interventions:  Attentive, Engaged, Interested , and Receptive   Education Outcome:  Acknowledges education   Clinical Observations/Individualized Feedback: Terri Woods was active in their participation of session activities and group discussion. Pt identified that her anxiety was a 2 and stress was a 0 before the session. Afterwards, pt shared that her anxiety was a 1 and stress was a 0. Pt shared that she has done something  similar to this with her horses, music, and a light box. Pt interacted well with LRT and peers duration of session.    Plan: Continue to engage patient in RT group sessions 2-3x/week.   Rosina Lowenstein, LRT, CTRS 03/28/2023 11:28 AM

## 2023-03-28 NOTE — Plan of Care (Signed)

## 2023-03-28 NOTE — Progress Notes (Signed)
-   Patient alert and oriented x3, she presented with a sad affect/mood and c/o back and leg pain. Pt. denies SI, HI, plan or intent and she also denied A/V/H, and report  9/10 pain." Today was a better day, I'm just feeling tired". A- Scheduled medications administered to patient, per MD orders. Support and encouragement provided.  Routine safety checks conducted every 15 minutes.  Patient informed to notify staff with problems or concerns. R- No adverse drug reactions noted. Patient contracts for safety at this time. Patient compliant with medications and treatment plan. Patient receptive, calm, and cooperative. Patient interacts well with others on the unit.  Patient remains safe at this time.

## 2023-03-28 NOTE — Group Note (Signed)
LCSW Group Therapy Note   Group Date: 03/28/2023 Start Time: 1330 End Time: 1430   Type of Therapy and Topic:  Group Therapy: Challenging Core Beliefs  Participation Level:  Active  Description of Group:  Patients were educated about core beliefs and asked to identify one harmful core belief that they have. Patients were asked to explore from where those beliefs originate. Patients were asked to discuss how those beliefs make them feel and the resulting behaviors of those beliefs. They were then be asked if those beliefs are true and, if so, what evidence they have to support them. Lastly, group members were challenged to replace those negative core beliefs with helpful beliefs.   Therapeutic Goals:   1. Patient will identify harmful core beliefs and explore the origins of such beliefs. 2. Patient will identify feelings and behaviors that result from those core beliefs. 3. Patient will discuss whether such beliefs are true. 4.  Patient will replace harmful core beliefs with helpful ones.  Summary of Patient Progress:  Patient actively engaged in processing and exploring how core beliefs are formed and how they impact thoughts, feelings, and behaviors. Patient proved open to input from peers and feedback from CSW. Patient demonstrated proficient insight into the subject matter, was respectful and supportive of peers, and participated throughout the entire session. Patient shared her fear of "not being able to do enough" due to her disabilities. Together, patient explored this fear with the group identifying the automatic thoughts, feelings and behaviors attached to this core fear. Patient identified ways to challenge her maladaptive thoughts to change the way that she feels and ultimately reacts.   Therapeutic Modalities: Cognitive Behavioral Therapy; Solution-Focused Therapy   Lowry Ram, LCSWA 03/28/2023  2:21 PM
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
## 2023-03-28 NOTE — Plan of Care (Signed)
 Pt denies SI/HI/AVH, compliant with procedures on the unit   Problem: Education: Goal: Knowledge of [facility] Education information/materials will improve Outcome: Progressing Goal: Emotional status will improve Outcome: Progressing Goal: Mental status will improve Outcome: Progressing Goal: Verbalization of understanding the information provided will improve Outcome: Progressing   Problem: Activity: Goal: Interest or engagement in activities will improve Outcome: Progressing Goal: Sleeping patterns will improve Outcome: Progressing   Problem: Coping: Goal: Ability to verbalize frustrations and anger appropriately will improve Outcome: Progressing Goal: Ability to demonstrate self-control will improve Outcome: Progressing   Problem: Health Behavior/Discharge Planning: Goal: Identification of resources available to assist in meeting health care needs will improve Outcome: Progressing Goal: Compliance with treatment plan for underlying cause of condition will improve Outcome: Progressing   Problem: Physical Regulation: Goal: Ability to maintain clinical measurements within normal limits will improve Outcome: Progressing   Problem: Safety: Goal: Periods of time without injury will increase Outcome: Progressing

## 2023-03-28 NOTE — Plan of Care (Signed)

## 2023-03-28 NOTE — Inpatient Diabetes Management (Signed)
Inpatient Diabetes Program Recommendations  AACE/ADA: New Consensus Statement on Inpatient Glycemic Control (2015)  Target Ranges:  Prepandial:   less than 140 mg/dL      Peak postprandial:   less than 180 mg/dL (1-2 hours)      Critically ill patients:  140 - 180 mg/dL   Lab Results  Component Value Date   GLUCAP 170 (H) 03/27/2023    Review of Glycemic Control  Latest Reference Range & Units 03/26/23 09:06 03/26/23 17:18 03/27/23 07:53  Glucose-Capillary 70 - 99 mg/dL 161 (H) 096 (H) 045 (H)  (H): Data is abnormally high Admit with: Suicide Attempt / Elevated BP/ Headache   History: DM2   Home DM Meds: Lantus 50 units at bedtime                             Glipizide 10 mg BID   Current Orders: Semglee 50 units at bedtime                            Glipizide 10 mg BID         MD- Consider adding Novolog Moderate Correction Scale/ SSI (0-15 units) TID AC + HS (Use Glycemic Control Order set) And changing diet to carb modified if appropriate.   Noted consult for CGM. Recommend patient following up with PCP.   Thanks, Lujean Rave, MSN, RNC-OB Diabetes Coordinator [phone] (8a-5p)

## 2023-03-28 NOTE — Progress Notes (Signed)
D- Patient alert and oriented. Patient presents in a pleasant mood on assessment reporting that she slept good last night and had complaints of generalized pain, rating her pain a "6/10", in which she did request PRN pain medication to help with relief. Patient denies SI, HI, AVH  at this time. Patient also denies any signs/symptoms of depression and anxiety to this Clinical research associate. Patient's goal for today is "understanding what I've done", in which she will "pray about it, never to do it again", in order to achieve her goal.   A- Scheduled medications administered to patient, per MD orders. Support and encouragement provided. Routine safety checks conducted every 15 minutes. Patient informed to notify staff with problems or concerns.  R- No adverse drug reactions noted. Patient contracts for safety at this time. Patient compliant with medications and treatment plan. Patient receptive, calm, and cooperative. Patient interacts well with others on the unit. Patient remains safe at this time.   03/28/23 0845  Psych Admission Type (Psych Patients Only)  Admission Status Involuntary  Psychosocial Assessment  Patient Complaints None  Eye Contact Fair  Facial Expression Anxious  Affect Appropriate to circumstance  Speech Logical/coherent;Soft  Interaction Assertive  Motor Activity Slow  Appearance/Hygiene Unremarkable  Behavior Characteristics Cooperative;Appropriate to situation  Mood Pleasant  Aggressive Behavior  Effect No apparent injury  Thought Process  Coherency Circumstantial  Content WDL  Delusions None reported or observed  Perception WDL  Hallucination None reported or observed  Judgment WDL  Confusion None  Danger to Self  Current suicidal ideation? Denies  Agreement Not to Harm Self Yes  Description of Agreement Verbal  Danger to Others  Danger to Others None reported or observed

## 2023-03-28 NOTE — Group Note (Signed)
Date:  03/28/2023 Time:  10:01 AM  Group Topic/Focus:  Goals Group:   The focus of this group is to help patients establish daily goals to achieve during treatment and discuss how the patient can incorporate goal setting into their daily lives to aide in recovery.    Participation Level:  Active  Participation Quality:  Appropriate  Affect:  Appropriate  Cognitive:  Appropriate  Insight: Appropriate  Engagement in Group:  Engaged  Modes of Intervention:  Discussion, Education, and Support  Additional Comments:    Wilford Corner 03/28/2023, 10:01 AM

## 2023-03-28 NOTE — Progress Notes (Addendum)
[facility] MD Progress Note  03/28/2023 11;36 AM Terri Woods  MRN:  [id number] Subjective:  52 year old Caucasian female who inquires, "When can I go home?" She reports feelings of embarrassment and regret about a previous incident, stating, "I will never do that again; it was a stupid move. I am so embarrassed." The patient denies suicidal ideation (SI), homicidal ideation (HI), or self-injurious behavior (SIB).The patient demonstrates insight into the previous incident and expresses regret, indicating a low risk of recurrence.  Objective: Principal Problem: MDD (major depressive disorder) Diagnosis: Principal Problem:   MDD (major depressive disorder) Active Problems:   Chronic prescription opiate use  Total Time spent with patient: 1.5 hours  Past Psychiatric History: Depression Anxeity  Past Medical History:  Past Medical History:  Diagnosis Date   Arthritis    neck, hips, fingers   Asthma    Back pain    Breast cancer (HCC) 2001   RT LUMPECTOMY PER PT   Carpal tunnel syndrome    right arm   Crohn's disease (HCC)    Diabetes (HCC)    Fibromyalgia    GERD (gastroesophageal reflux disease)    Glaucoma    Headache    Hypertension     Past Surgical History:  Procedure Laterality Date   BILATERAL CARPAL TUNNEL RELEASE Bilateral 02/05/2019   Procedure: BILATERAL CARPAL TUNNEL RELEASE;  Surgeon: Kennedy Bucker, MD;  Location: [facility] ORS;  Service: Orthopedics;  Laterality: Bilateral;   BREAST EXCISIONAL BIOPSY Right 15+ yrs ago   NEG   BREAST SURGERY     CATARACT EXTRACTION W/PHACO Right 10/30/2021   Procedure: CATARACT EXTRACTION PHACO AND INTRAOCULAR LENS PLACEMENT (IOC) RIGHT DIABETIC 1.85 00:20.9;  Surgeon: Nevada Crane, MD;  Location: [facility];  Service: Ophthalmology;  Laterality: Right;  Diabetic   COLONOSCOPY WITH PROPOFOL N/A 03/10/2020   Procedure: COLONOSCOPY WITH PROPOFOL;  Surgeon: Sung Amabile, DO;  Location: [facility] ENDOSCOPY;  Service: General;   Laterality: N/A;   FOOT SURGERY Left    LYMPH NODE BIOPSY     NASAL SEPTOPLASTY W/ TURBINOPLASTY Bilateral 12/25/2018   Procedure: NASAL SEPTOPLASTY WITH INFERIOR TURBINATE REDUCTION;  Surgeon: Vernie Murders, MD;  Location: [facility];  Service: ENT;  Laterality: Bilateral;   SHOULDER SURGERY     SMALL INTESTINE SURGERY     Family History:  Family History  Problem Relation Age of Onset   Heart attack Sister 57   Heart disease Father    Breast cancer Mother    Breast cancer Maternal Aunt    Family Psychiatric  Sister Depression Niece Polysubstance Abuse Social History:  Social History   Substance and Sexual Activity  Alcohol Use No   Alcohol/week: 0.0 standard drinks of alcohol     Social History   Substance and Sexual Activity  Drug Use Yes   Types: Oxycodone    Social History   Socioeconomic History   Marital status: Married    Spouse name: Not on file   Number of children: Not on file   Years of education: Not on file   Highest education level: Not on file  Occupational History   Not on file  Tobacco Use   Smoking status: Never   Smokeless tobacco: Never  Vaping Use   Vaping status: Never Used  Substance and Sexual Activity   Alcohol use: No    Alcohol/week: 0.0 standard drinks of alcohol   Drug use: Yes    Types: Oxycodone   Sexual activity: Yes    Birth control/protection: None  Comment: Married  Other Topics Concern   Not on file  Social History Narrative   Not on file   Social Determinants of Health   Financial Resource Strain: Low Risk  (05/22/2022)   Received from [facility], [facility]   Overall Financial Resource Strain (CARDIA)    Difficulty of Paying Living Expenses: Not hard at all  Food Insecurity: No Food Insecurity (03/26/2023)   Hunger Vital Sign    Worried About Running Out of Food in the Last Year: Never true    Ran Out of Food in the Last Year: Never true  Transportation Needs: No Transportation Needs  (03/26/2023)   PRAPARE - Administrator, Civil Service (Medical): No    Lack of Transportation (Non-Medical): No  Physical Activity: Not on file  Stress: Not on file  Social Connections: Not on file   Additional Social History:                         Sleep: Good  Appetite:  Good  Current Medications: Current Facility-Administered Medications  Medication Dose Route Frequency Provider Last Rate Last Admin   acetaminophen (TYLENOL) tablet 650 mg  650 mg Oral Q6H PRN Lauree Chandler, NP   650 mg at 03/28/23 0248   alum & mag hydroxide-simeth (MAALOX/MYLANTA) 200-200-20 MG/5ML suspension 30 mL  30 mL Oral Q4H PRN Lauree Chandler, NP       amLODipine (NORVASC) tablet 5 mg  5 mg Oral Daily Rosezetta Schlatter T, MD   5 mg at 03/28/23 1610   aspirin EC tablet 81 mg  81 mg Oral Daily [name], NP   81 mg at 03/28/23 1217   atorvastatin (LIPITOR) tablet 40 mg  40 mg Oral Daily Lauree Chandler, NP   40 mg at 03/28/23 0842   brimonidine (ALPHAGAN) 0.2 % ophthalmic solution 1 drop  1 drop Both Eyes BID Lauree Chandler, NP   1 drop at 03/28/23 1722   clonazePAM (KLONOPIN) tablet 1 mg  1 mg Oral BID PRN [name], NP       cyclobenzaprine (FLEXERIL) tablet 7.5 mg  7.5 mg Oral TID PRN [name], NP   7.5 mg at 03/28/23 9604   diphenhydrAMINE (BENADRYL) capsule 50 mg  50 mg Oral TID PRN Lauree Chandler, NP       Or   diphenhydrAMINE (BENADRYL) injection 50 mg  50 mg Intramuscular TID PRN Lauree Chandler, NP       docusate sodium (COLACE) capsule 100 mg  100 mg Oral Daily [name], NP   100 mg at 03/28/23 1217   DULoxetine (CYMBALTA) DR capsule 60 mg  60 mg Oral QHS Lewanda Rife, MD   60 mg at 03/27/23 2103   And   DULoxetine (CYMBALTA) DR capsule 30 mg  30 mg Oral QHS Lewanda Rife, MD   30 mg at 03/27/23 2103   glipiZIDE (GLUCOTROL) tablet 10 mg  10 mg Oral BID WC Lauree Chandler, NP   10 mg at 03/28/23 1722   haloperidol  (HALDOL) tablet 5 mg  5 mg Oral TID PRN Lauree Chandler, NP       Or   haloperidol lactate (HALDOL) injection 5 mg  5 mg Intramuscular TID PRN Lauree Chandler, NP       insulin glargine-yfgn [facility]) injection 50 Units  50 Units Subcutaneous QHS Lauree Chandler, NP  50 Units at 03/27/23 2104   lisinopril (ZESTRIL) tablet 20 mg  20 mg Oral Daily Rosezetta Schlatter T, MD   20 mg at 03/28/23 6578   LORazepam (ATIVAN) tablet 2 mg  2 mg Oral TID PRN Lauree Chandler, NP       Or   LORazepam (ATIVAN) injection 2 mg  2 mg Intramuscular TID PRN Lauree Chandler, NP       magnesium hydroxide (MILK OF MAGNESIA) suspension 30 mL  30 mL Oral Daily PRN Lauree Chandler, NP       naloxone [facility]) nasal spray 4 mg/0.1 mL  1 spray Nasal PRN Lauree Chandler, NP       neomycin-bacitracin-polymyxin [phone] OINT 1 Application  1 Application Apply externally BID PRN [name], NP   1 Application at 03/28/23 1722   ondansetron (ZOFRAN-ODT) disintegrating tablet 4 mg  4 mg Oral Q8H PRN [name], NP   4 mg at 03/28/23 0248   oxyCODONE (OXYCONTIN) 12 hr tablet 10 mg  10 mg Oral Q8H Lauree Chandler, NP   10 mg at 03/28/23 1448   oxyCODONE-acetaminophen (PERCOCET/ROXICET) 5-325 MG per tablet 1 tablet  1 tablet Oral Q8H PRN [name], NP   1 tablet at 03/28/23 0842   polyethylene glycol (MIRALAX / GLYCOLAX) packet 17 g  17 g Oral Daily PRN [name], NP       QUEtiapine (SEROQUEL) tablet 300 mg  300 mg Oral QHS Lauree Chandler, NP   300 mg at 03/27/23 2103    Lab Results:  Results for orders placed or performed during the hospital encounter of 03/26/23 (from the past 48 hour(s))  CK     Status: None   Collection Time: 03/27/23  6:51 AM  Result Value Ref Range   Total CK 49 38 - 234 U/L    Comment: Performed at [facility], [address] [place], [place] [address]  Glucose, capillary     Status: Abnormal   Collection Time: 03/27/23  7:53 AM  Result Value  Ref Range   Glucose-Capillary 170 (H) 70 - 99 mg/dL    Comment: Glucose reference range applies only to samples taken after fasting for at least 8 hours.    Blood Alcohol level:  Lab Results  Component Value Date   ETH <10 03/25/2023    Metabolic Disorder Labs: No results found for: "HGBA1C", "MPG" No results found for: "PROLACTIN" No results found for: "CHOL", "TRIG", "HDL", "CHOLHDL", "VLDL", "LDLCALC"  Physical Findings: AIMS:  , ,  ,  ,    CIWA:    COWS:     Musculoskeletal: Strength & Muscle Tone: within normal limits Gait & Station: normal Patient leans: N/A  Psychiatric Specialty Exam:  Presentation  General Appearance:  Appropriate for Environment; Neat  Eye Contact: Good  Speech: Clear and Coherent; Normal Rate  Speech Volume: Normal  Handedness: Left   Mood and Affect  Mood: Anxious  Affect: Congruent   Thought Process  Thought Processes: Coherent  Descriptions of Associations:Intact  Orientation:Full (Time, Place and Person) (and situation)  Thought Content:Logical; WDL  History of Schizophrenia/Schizoaffective disorder:none Duration of Psychotic Symptoms: none Hallucinations:Hallucinations: None  Ideas of Reference:None  Suicidal Thoughts:Suicidal Thoughts: No  Homicidal Thoughts:Homicidal Thoughts: No   Sensorium  Memory: Immediate Good; Remote Fair  Judgment: Fair  Insight: Fair   Executive Functions  Concentration: Good  Attention Span: Good  Recall: Good  Fund of Knowledge: Good  Language:  Good   Psychomotor Activity  Psychomotor Activity: Psychomotor Activity: Normal   Assets  Assets: Communication Skills; Financial Resources/Insurance; Housing; Resilience; Social Support   Sleep  Sleep: Sleep: Fair Number of Hours of Sleep: 6    Physical Exam: Physical Exam Vitals and nursing note reviewed.  Constitutional:      Appearance: Normal appearance.  HENT:     Head: Normocephalic  and atraumatic.  Musculoskeletal:     Cervical back: Normal range of motion.  Neurological:     Mental Status: She is alert.  Psychiatric:        Attention and Perception: Attention and perception normal.        Mood and Affect: Affect normal. Mood is anxious.        Speech: Speech normal.        Behavior: Behavior normal. Behavior is cooperative.        Thought Content: Thought content normal.        Cognition and Memory: Cognition and memory normal.        Judgment: Judgment normal.    ROS Blood pressure (!) 141/75, pulse 93, temperature (!) 97.2 F (36.2 C), resp. rate 18, height 5\' 1"  (1.549 m), weight 72.9 kg, SpO2 97%. Body mass index is 30.38 kg/m. Duloxetine (Cymbalta): 90 mg, oral, nightly for depression and/or anxiety. Quetiapine (Seroquel): 300 mg, oral, at bedtimefor mood stabilization and/or sleep improvement. Clonazepam (Klonopin): 1 mg, oral, BID PRNAnxiety management or acute panic episodes. Monitor usage frequency to prevent dependency or tolerance Continue current psychiatric medication regimen. Regular follow-up to assess efficacy and side effects of Duloxetine and Quetiapine. Evaluate the frequency of Clonazepam use and consider alternatives for anxiety management if dependency concerns arise.  Treatment Plan Summary: Daily contact with patient to assess and evaluate symptoms and progress in treatment and Medication management  [name], NP 03/28/2023, 6:38 PM

## 2023-03-28 NOTE — Group Note (Signed)
Date:  03/28/2023 Time:  9:03 PM  Group Topic/Focus:  Wrap-Up Group:   The focus of this group is to help patients review their daily goal of treatment and discuss progress on daily workbooks.    Participation Level:  Active  Participation Quality:  Appropriate, Attentive, Sharing, and Supportive  Affect:  Appropriate  Cognitive:  Appropriate  Insight: Appropriate and Good  Engagement in Group:  Limited  Modes of Intervention:  Discussion and Support  Additional Comments:     Belva Crome 03/28/2023, 9:03 PM

## 2023-03-29 DIAGNOSIS — F324 Major depressive disorder, single episode, in partial remission: Secondary | ICD-10-CM | POA: Diagnosis not present

## 2023-03-29 LAB — GLUCOSE, CAPILLARY: Glucose-Capillary: 303 mg/dL — ABNORMAL HIGH (ref 70–99)

## 2023-03-29 MED ORDER — OXYCODONE HCL ER 10 MG PO T12A: 10.0000 mg | EXTENDED_RELEASE_TABLET | Freq: Two times a day (BID) | ORAL | Status: DC | PRN

## 2023-03-29 MED ORDER — CLONAZEPAM 0.5 MG PO TABS
0.5000 mg | ORAL_TABLET | Freq: Two times a day (BID) | ORAL | Status: DC | PRN
  Administered 2023-03-29: 0.5 mg via ORAL
  Filled 2023-03-29: qty 1

## 2023-03-29 MED ORDER — CYCLOBENZAPRINE HCL 5 MG PO TABS: 7.5000 mg | ORAL_TABLET | Freq: Two times a day (BID) | ORAL | Status: DC | PRN

## 2023-03-29 NOTE — Group Note (Unsigned)
Date:  03/29/2023 Time:  11:19 PM  Group Topic/Focus:  Watch Movie     Participation Level:  {[facility] PARTICIPATION HQION:62952}  Participation Quality:  {[facility] PARTICIPATION QUALITY:22265}  Affect:  {[facility] AFFECT:22266}  Cognitive:  {[facility] COGNITIVE:22267}  Insight: {B[facility] Insight2:20797}  Engagement in Group:  {[facility] ENGAGEMENT IN GROUP:22268}  Modes of Intervention:  {[facility] MODES OF INTERVENTION:22269}  Additional Comments:  ***  Lenore Cordia 03/29/2023, 11:19 PM

## 2023-03-29 NOTE — Progress Notes (Signed)
D- Patient alert and oriented. Patient presented in a preoccupied, but pleasant mood on assessment questioning if she'll be discharged today. Patient stated that she slept ok last night and had complaints of pain throughout the day, in which she requested PRN medication for pain to help with relief. Patient denied SI, HI, AVH at this time. Patient also denied any signs/symptoms of depression and anxiety. Patient had no stated goals for today.  A- Scheduled medications administered to patient, per MD orders. Support and encouragement provided. Routine safety checks conducted every 15 minutes. Patient informed to notify staff with problems or concerns.  R- No adverse drug reactions noted. Patient contracts for safety at this time. Patient compliant with medications and treatment plan. Patient receptive, calm, and cooperative. Patient interacts well with others on the unit. Patient remains safe at this time.   03/29/23 1100  Psych Admission Type (Psych Patients Only)  Admission Status Involuntary  Psychosocial Assessment  Patient Complaints None  Eye Contact Fair  Facial Expression Worried;Anxious  Affect Appropriate to circumstance  Speech Logical/coherent  Interaction Assertive  Motor Activity Slow  Appearance/Hygiene Unremarkable  Behavior Characteristics Cooperative;Appropriate to situation  Mood Preoccupied;Pleasant  Aggressive Behavior  Effect No apparent injury  Thought Process  Coherency Circumstantial  Content Blaming others  Delusions None reported or observed  Perception WDL  Hallucination None reported or observed  Judgment WDL  Confusion None  Danger to Self  Current suicidal ideation? Denies  Agreement Not to Harm Self Yes  Description of Agreement Verbal  Danger to Others  Danger to Others None reported or observed

## 2023-03-29 NOTE — Group Note (Signed)
Date:  03/29/2023 Time:  11:36 PM  Group Topic/Focus:  Wrap-Up Group:   The focus of this group is to help patients review their daily goal of treatment and discuss progress on daily workbooks.    Participation Level:  Did Not Attend   Lenore Cordia 03/29/2023, 11:36 PM

## 2023-03-29 NOTE — Group Note (Signed)
Date:  03/29/2023 Time:  10:05 AM  Group Topic/Focus:  Spirituality:   The focus of this group is to discuss how one's spirituality can aide in recovery.    Participation Level:  Did Not Attend   Terri Woods 03/29/2023, 10:05 AM

## 2023-03-29 NOTE — Plan of Care (Signed)

## 2023-03-29 NOTE — BHH Counselor (Signed)
CSW spoke with the patient's husband.  Husband expressed confusion as to patient's continued stay at the hospital.  CSW explained that at this time the patient is under IVC.    Husband then asked if patient could leave AMA.  CSW explained that AMA is not a possibility to this writers knowledge due to the IVC.   He reports "I just want her home, my house is just so empty".  CSW provided empathy.    Husband was also under the impression that patient would be discharged today, like the patient is under the impression, CSW explained that while he IVC is in place the 72 hour form can not be signed so at this time the 72 hour is not at play.,  He expressed frustration but also understanding.  Conversation ended without issue.   Penni Homans, MSW, LCSW 03/29/2023 2:13 PM

## 2023-03-29 NOTE — Progress Notes (Signed)
Patient received scheduled 0600 Qxycontin at 0950. This writer moved next two doses to eights hours after first 0950 dose.

## 2023-03-29 NOTE — Progress Notes (Signed)
[facility] MD Progress Note  03/29/2023 3:05 PM Terri Woods  MRN:  [id number] Subjective:  52 year old Caucasian female, states, "They said I can leave today," and expresses the belief that she can be discharged after 72 hours under her involuntary commitment (IVC). She denies suicidal ideation (SI), homicidal ideation (HI), or auditory/visual hallucinations (AVH). The patient has been receiving scheduled OxyContin 10 mg but will transition to an as-needed schedule moving forward. Principal Problem: MDD (major depressive disorder) Diagnosis: Principal Problem:   MDD (major depressive disorder) Active Problems:   Chronic prescription opiate use  Total Time spent with patient: 1 hour  Past Psychiatric History: Anxiety Depression  Past Medical History:  Past Medical History:  Diagnosis Date   Arthritis    neck, hips, fingers   Asthma    Back pain    Breast cancer (HCC) 2001   RT LUMPECTOMY PER PT   Carpal tunnel syndrome    right arm   Crohn's disease (HCC)    Diabetes (HCC)    Fibromyalgia    GERD (gastroesophageal reflux disease)    Glaucoma    Headache    Hypertension     Past Surgical History:  Procedure Laterality Date   BILATERAL CARPAL TUNNEL RELEASE Bilateral 02/05/2019   Procedure: BILATERAL CARPAL TUNNEL RELEASE;  Surgeon: Kennedy Bucker, MD;  Location: [facility] ORS;  Service: Orthopedics;  Laterality: Bilateral;   BREAST EXCISIONAL BIOPSY Right 15+ yrs ago   NEG   BREAST SURGERY     CATARACT EXTRACTION W/PHACO Right 10/30/2021   Procedure: CATARACT EXTRACTION PHACO AND INTRAOCULAR LENS PLACEMENT (IOC) RIGHT DIABETIC 1.85 00:20.9;  Surgeon: Nevada Crane, MD;  Location: [facility];  Service: Ophthalmology;  Laterality: Right;  Diabetic   COLONOSCOPY WITH PROPOFOL N/A 03/10/2020   Procedure: COLONOSCOPY WITH PROPOFOL;  Surgeon: Sung Amabile, DO;  Location: [facility];  Service: General;  Laterality: N/A;   FOOT SURGERY Left    LYMPH NODE BIOPSY     NASAL  SEPTOPLASTY W/ TURBINOPLASTY Bilateral 12/25/2018   Procedure: NASAL SEPTOPLASTY WITH INFERIOR TURBINATE REDUCTION;  Surgeon: Vernie Murders, MD;  Location: [facility];  Service: ENT;  Laterality: Bilateral;   SHOULDER SURGERY     SMALL INTESTINE SURGERY     Family History:  Family History  Problem Relation Age of Onset   Heart attack Sister 65   Heart disease Father    Breast cancer Mother    Breast cancer Maternal Aunt    Family Psychiatric  History: Sister depression Social History:  Social History   Substance and Sexual Activity  Alcohol Use No   Alcohol/week: 0.0 standard drinks of alcohol     Social History   Substance and Sexual Activity  Drug Use Yes   Types: Oxycodone    Social History   Socioeconomic History   Marital status: Married    Spouse name: Not on file   Number of children: Not on file   Years of education: Not on file   Highest education level: Not on file  Occupational History   Not on file  Tobacco Use   Smoking status: Never   Smokeless tobacco: Never  Vaping Use   Vaping status: Never Used  Substance and Sexual Activity   Alcohol use: No    Alcohol/week: 0.0 standard drinks of alcohol   Drug use: Yes    Types: Oxycodone   Sexual activity: Yes    Birth control/protection: None    Comment: Married  Other Topics Concern   Not on  file  Social History Narrative   Not on file   Social Determinants of Health   Financial Resource Strain: Low Risk  (05/22/2022)   Received from [facility], [facility]   Overall Financial Resource Strain (CARDIA)    Difficulty of Paying Living Expenses: Not hard at all  Food Insecurity: No Food Insecurity (03/26/2023)   Hunger Vital Sign    Worried About Running Out of Food in the Last Year: Never true    Ran Out of Food in the Last Year: Never true  Transportation Needs: No Transportation Needs (03/26/2023)   PRAPARE - Administrator, Civil Service (Medical): No    Lack of  Transportation (Non-Medical): No  Physical Activity: Not on file  Stress: Not on file  Social Connections: Not on file   Additional Social History:                         Sleep: Negative  Appetite:  Negative  Current Medications: Current Facility-Administered Medications  Medication Dose Route Frequency Provider Last Rate Last Admin   acetaminophen (TYLENOL) tablet 650 mg  650 mg Oral Q6H PRN Lauree Chandler, NP   650 mg at 03/29/23 1102   alum & mag hydroxide-simeth (MAALOX/MYLANTA) 200-200-20 MG/5ML suspension 30 mL  30 mL Oral Q4H PRN Lauree Chandler, NP       amLODipine (NORVASC) tablet 5 mg  5 mg Oral Daily Rosezetta Schlatter T, MD   5 mg at 03/29/23 0950   aspirin EC tablet 81 mg  81 mg Oral Daily [name], NP   81 mg at 03/29/23 0950   atorvastatin (LIPITOR) tablet 40 mg  40 mg Oral Daily Lauree Chandler, NP   40 mg at 03/29/23 0950   brimonidine (ALPHAGAN) 0.2 % ophthalmic solution 1 drop  1 drop Both Eyes BID Lauree Chandler, NP   1 drop at 03/29/23 0949   clonazePAM (KLONOPIN) tablet 1 mg  1 mg Oral BID PRN [name], NP       cyclobenzaprine (FLEXERIL) tablet 7.5 mg  7.5 mg Oral TID PRN [name], NP   7.5 mg at 03/28/23 1610   diphenhydrAMINE (BENADRYL) capsule 50 mg  50 mg Oral TID PRN Lauree Chandler, NP       Or   diphenhydrAMINE (BENADRYL) injection 50 mg  50 mg Intramuscular TID PRN Lauree Chandler, NP       docusate sodium (COLACE) capsule 100 mg  100 mg Oral Daily [name], NP   100 mg at 03/29/23 0950   DULoxetine (CYMBALTA) DR capsule 60 mg  60 mg Oral QHS Lewanda Rife, MD   60 mg at 03/28/23 2035   And   DULoxetine (CYMBALTA) DR capsule 30 mg  30 mg Oral QHS Lewanda Rife, MD   30 mg at 03/28/23 2037   glipiZIDE (GLUCOTROL) tablet 10 mg  10 mg Oral BID WC Lauree Chandler, NP   10 mg at 03/29/23 0950   haloperidol (HALDOL) tablet 5 mg  5 mg Oral TID PRN Lauree Chandler, NP       Or   haloperidol  lactate (HALDOL) injection 5 mg  5 mg Intramuscular TID PRN Lauree Chandler, NP       insulin glargine-yfgn [facility]) injection 50 Units  50 Units Subcutaneous QHS Lauree Chandler, NP   50 Units at 03/28/23 2035   lisinopril (ZESTRIL) tablet  20 mg  20 mg Oral Daily Rosezetta Schlatter T, MD   20 mg at 03/29/23 0950   LORazepam (ATIVAN) tablet 2 mg  2 mg Oral TID PRN Lauree Chandler, NP       Or   LORazepam (ATIVAN) injection 2 mg  2 mg Intramuscular TID PRN Lauree Chandler, NP       magnesium hydroxide (MILK OF MAGNESIA) suspension 30 mL  30 mL Oral Daily PRN Lauree Chandler, NP       naloxone [facility]) nasal spray 4 mg/0.1 mL  1 spray Nasal PRN Lauree Chandler, NP       neomycin-bacitracin-polymyxin [id number] OINT 1 Application  1 Application Apply externally BID PRN [name], NP   1 Application at 03/28/23 1722   ondansetron (ZOFRAN-ODT) disintegrating tablet 4 mg  4 mg Oral Q8H PRN [name], NP   4 mg at 03/28/23 0248   oxyCODONE (OXYCONTIN) 12 hr tablet 10 mg  10 mg Oral BID PRN [name], NP       oxyCODONE-acetaminophen (PERCOCET/ROXICET) 5-325 MG per tablet 1 tablet  1 tablet Oral Q8H PRN [name], NP   1 tablet at 03/28/23 0842   polyethylene glycol (MIRALAX / GLYCOLAX) packet 17 g  17 g Oral Daily PRN [name], NP       QUEtiapine (SEROQUEL) tablet 300 mg  300 mg Oral QHS Lauree Chandler, NP   300 mg at 03/28/23 2037    Lab Results:  Results for orders placed or performed during the hospital encounter of 03/26/23 (from the past 48 hour(s))  Glucose, capillary     Status: Abnormal   Collection Time: 03/28/23  8:30 PM  Result Value Ref Range   Glucose-Capillary 267 (H) 70 - 99 mg/dL    Comment: Glucose reference range applies only to samples taken after fasting for at least 8 hours.    Blood Alcohol level:  Lab Results  Component Value Date   ETH <10 03/25/2023    Metabolic Disorder Labs: No results found for: "HGBA1C", "MPG" No  results found for: "PROLACTIN" No results found for: "CHOL", "TRIG", "HDL", "CHOLHDL", "VLDL", "LDLCALC"  Physical Findings: AIMS:  , ,  ,  ,    CIWA:    COWS:     Musculoskeletal: Strength & Muscle Tone: within normal limits Gait & Station: normal Patient leans: N/A  Psychiatric Specialty Exam:  Presentation  General Appearance:  Appropriate for Environment  Eye Contact: Minimal  Speech: Clear and Coherent; Normal Rate  Speech Volume: Normal  Handedness: Right   Mood and Affect  Mood: Anxious; Irritable  Affect: Flat   Thought Process  Thought Processes: Coherent  Descriptions of Associations:Intact  Orientation:Full (Time, Place and Person) (and situation)  Thought Content:WDL  History of Schizophrenia/Schizoaffective disorder: none reported Duration of Psychotic Symptoms: none reported Hallucinations:Hallucinations: None  Ideas of Reference:None  Suicidal Thoughts:Suicidal Thoughts: No  Homicidal Thoughts:Homicidal Thoughts: No   Sensorium  Memory: Immediate Good; Remote Good  Judgment: Fair  Insight: Fair   Art therapist  Concentration: Good  Attention Span: Fair  Recall: Good  Fund of Knowledge: Good  Language: Good   Psychomotor Activity  Psychomotor Activity: Psychomotor Activity: Normal   Assets  Assets: Communication Skills; Social Support; Housing   Sleep  Sleep: Sleep: Fair Number of Hours of Sleep: 6    Physical Exam: Physical Exam Vitals and nursing note reviewed.  Constitutional:      Appearance: Normal appearance.  HENT:     Head: Normocephalic and atraumatic.     Nose: Nose normal.  Pulmonary:     Effort: Pulmonary effort is normal.     Breath sounds: Normal breath sounds.  Musculoskeletal:        General: Normal range of motion.     Cervical back: Normal range of motion.  Neurological:     General: No focal deficit present.     Mental Status: She is alert and oriented to  person, place, and time. Mental status is at baseline.  Psychiatric:        Attention and Perception: Attention and perception normal.        Mood and Affect: Mood is anxious. Affect is labile.        Speech: Speech normal.        Behavior: Behavior normal. Behavior is cooperative.        Thought Content: Thought content normal.        Cognition and Memory: Cognition and memory normal.        Judgment: Judgment normal.    Review of Systems  All other systems reviewed and are negative.  Blood pressure 115/69, pulse 94, temperature 97.6 F (36.4 C), resp. rate 17, height 5\' 1"  (1.549 m), weight 72.9 kg, SpO2 98%. Body mass index is 30.38 kg/m.   Treatment Plan Summary: Daily contact with patient to assess and evaluate symptoms and progress in treatment and Medication management Duloxetine (Cymbalta): 90 mg, oral, nightly for depression and/or anxiety. Quetiapine (Seroquel): 300 mg, oral, at bedtimefor mood stabilization and/or sleep improvement. Clonazepam (Klonopin): 0.5 mg, oral, BID PRNAnxiety management or acute panic episodes. [name], NP 03/29/2023, 3:05 PM

## 2023-03-29 NOTE — Group Note (Signed)
Date:  03/29/2023 Time:  4:53 PM  Group Topic/Focus:  Activity Group:  The focus of the group is to encourage patients to go outside to the courtyard to get some fresh air and exercise.    Participation Level:  Active  Participation Quality:  Appropriate  Affect:  Appropriate  Cognitive:  Appropriate  Insight: Appropriate  Engagement in Group:  Engaged  Modes of Intervention:  Activity  Additional Comments:    Terri Woods [name] 03/29/2023, 4:53 PM

## 2023-03-29 NOTE — Group Note (Signed)
Recreation Therapy Group Note   Group Topic:Leisure Education  Group Date: 03/29/2023 Start Time: 1015 End Time: 1115 Facilitators: Rosina Lowenstein, LRT, CTRS Location:  Craft Room  Group Description: Leisure. Patients were given the option to choose from singing karaoke, coloring mandalas, using oil pastels, journaling, or playing with play-doh. LRT and pts discussed the meaning of leisure, the importance of participating in leisure during their free time/when they're outside of the hospital, as well as how our leisure interests can also serve as coping skills.   Goal Area(s) Addressed:  Patient will identify a current leisure interest.  Patient will learn the definition of "leisure". Patient will practice making a positive decision. Patient will have the opportunity to try a new leisure activity. Patient will communicate with peers and LRT.    Affect/Mood: Appropriate and Flat   Participation Level: Active and Engaged   Participation Quality: Independent   Behavior: Calm and Cooperative   Speech/Thought Process: Coherent   Insight: Fair   Judgement: Fair    Modes of Intervention: Activity   Patient Response to Interventions:  Receptive   Education Outcome:  Acknowledges education   Clinical Observations/Individualized Feedback: Terri Woods was active in their participation of session activities and group discussion. Pt identified spend time with my grandchildren, husband, cats and horses" as things she does in her free time. Pt chose to color while in group. Pt left early after voicing "tooth ache" and went to get medicine from RN. Pt came back to group with 5 minutes remaining and spoke with LRT. Pt shared that her husband told her she was leaving today and that she feels like she is ready to go home.     Plan: Continue to engage patient in RT group sessions 2-3x/week.   Rosina Lowenstein, LRT, CTRS 03/29/2023 12:02 PM

## 2023-03-29 NOTE — Inpatient Diabetes Management (Signed)
Inpatient Diabetes Program Recommendations  AACE/ADA: New Consensus Statement on Inpatient Glycemic Control (2015)  Target Ranges:  Prepandial:   less than 140 mg/dL      Peak postprandial:   less than 180 mg/dL (1-2 hours)      Critically ill patients:  140 - 180 mg/dL     Admit with: Suicide Attempt / Elevated BP/ Headache   History: DM2   Home DM Meds: Lantus 50 units at bedtime                             Glipizide 10 mg BID   Current Orders: Semglee 50 units at bedtime                            Glipizide 10 mg BID    MD- Please add Novolog Moderate Correction Scale/ SSI (0-15 units) TID AC + HS (Use Glycemic Control Order set)      --Will follow patient during hospitalization--  Ambrose Finland RN, MSN, CDCES Diabetes Coordinator Inpatient Glycemic Control Team Team Pager: [phone] (8a-5p)

## 2023-03-30 DIAGNOSIS — F332 Major depressive disorder, recurrent severe without psychotic features: Secondary | ICD-10-CM | POA: Diagnosis not present

## 2023-03-30 MED ORDER — AMLODIPINE BESYLATE 5 MG PO TABS: 5.0000 mg | ORAL_TABLET | Freq: Every day | ORAL | 0 refills | Status: DC

## 2023-03-30 MED ORDER — CLONAZEPAM 0.5 MG PO TABS: 0.5000 mg | ORAL_TABLET | Freq: Two times a day (BID) | ORAL | 0 refills | Status: DC | PRN

## 2023-03-30 MED ORDER — CYCLOBENZAPRINE HCL 7.5 MG PO TABS: 7.5000 mg | ORAL_TABLET | Freq: Two times a day (BID) | ORAL | 0 refills | Status: AC | PRN

## 2023-03-30 MED ORDER — LISINOPRIL 20 MG PO TABS: 20.0000 mg | ORAL_TABLET | Freq: Every day | ORAL | 0 refills | Status: DC

## 2023-03-30 MED ORDER — GLIPIZIDE 10 MG PO TABS: 10.0000 mg | ORAL_TABLET | Freq: Two times a day (BID) | ORAL | 0 refills | Status: DC

## 2023-03-30 MED ORDER — OXYCODONE HCL ER 10 MG PO T12A: 10.0000 mg | EXTENDED_RELEASE_TABLET | Freq: Two times a day (BID) | ORAL | 0 refills | Status: AC | PRN

## 2023-03-30 MED ORDER — DOCUSATE SODIUM 100 MG PO CAPS: 100.0000 mg | ORAL_CAPSULE | Freq: Every day | ORAL | 0 refills | Status: AC

## 2023-03-30 MED ORDER — QUETIAPINE FUMARATE 300 MG PO TABS: 300.0000 mg | ORAL_TABLET | Freq: Every day | ORAL | 0 refills | Status: DC

## 2023-03-30 MED ORDER — OXYCODONE-ACETAMINOPHEN 5-325 MG PO TABS: 1.0000 | ORAL_TABLET | Freq: Three times a day (TID) | ORAL | 0 refills | Status: AC | PRN

## 2023-03-30 MED ORDER — BRIMONIDINE TARTRATE 0.2 % OP SOLN: 1.0000 [drp] | Freq: Two times a day (BID) | OPHTHALMIC | 0 refills | Status: AC

## 2023-03-30 MED ORDER — ASPIRIN 81 MG PO TBEC: 81.0000 mg | DELAYED_RELEASE_TABLET | Freq: Every day | ORAL | 0 refills | Status: AC

## 2023-03-30 MED ORDER — POLYETHYLENE GLYCOL 3350 17 G PO PACK: 17.0000 g | PACK | Freq: Every day | ORAL | 0 refills | Status: AC | PRN

## 2023-03-30 MED ORDER — INSULIN GLARGINE-YFGN 100 UNIT/ML ~~LOC~~ SOLN: 50.0000 [IU] | Freq: Every day | SUBCUTANEOUS | 0 refills | Status: AC

## 2023-03-30 MED ORDER — DULOXETINE HCL 30 MG PO CPEP: ORAL_CAPSULE | ORAL | 0 refills | Status: DC

## 2023-03-30 NOTE — Progress Notes (Signed)
Patient ID: Terri Woods, female   DOB: [birth year], 52 y.o.   MRN: [id number]  Discharge Note:  Patient denies SI/HI/AVH at this time. Discharge instructions, AVS, prescriptions, and transition record gone over with patient. Patient declined on completing her Suicide Safety Plan. Patient agrees to comply with medication management, follow-up visit, and outpatient therapy. Patient belongings returned to patient. Patient questions and concerns addressed and answered. Patient ambulatory off unit. Patient discharged to home with her husband.

## 2023-03-30 NOTE — Plan of Care (Signed)

## 2023-03-30 NOTE — Progress Notes (Signed)
D- Patient alert and oriented. Patient presents in a preoccupied, but pleasant mood on assessment stating that she slept good last night and had no complaints to voice to this Clinical research associate. Patient denies SI, HI, AVH, and pain at this time. Patient also denies any signs/symptoms of depression and anxiety, stating that she is ready to go home, so that she can ride her horses, "it's a beautiful day outside". Patient has no stated goals for today.  A- Scheduled medications administered to patient, per MD orders. Support and encouragement provided. Routine safety checks conducted every 15 minutes. Patient informed to notify staff with problems or concerns.  R- No adverse drug reactions noted. Patient contracts for safety at this time. Patient compliant with medications and treatment plan. Patient receptive, calm, and cooperative. Patient interacts well with others on the unit. Patient remains safe at this time.

## 2023-03-30 NOTE — BHH Suicide Risk Assessment (Addendum)
[facility] Discharge Suicide Risk Assessment   Principal Problem: MDD (major depressive disorder) Discharge Diagnoses: Principal Problem:   MDD (major depressive disorder) Active Problems:   Suicidal ideation   Chronic prescription opiate use   Total Time spent with patient: 2.5 hours  Musculoskeletal: Strength & Muscle Tone: within normal limits Gait & Station: normal Patient leans: Right  Psychiatric Specialty Exam  Presentation  General Appearance:  Appropriate for Environment; Neat  Eye Contact: Minimal  Speech: Clear and Coherent; Normal Rate  Speech Volume: Normal  Handedness: Right   Mood and Affect  Mood: Euthymic  Duration of Depression Symptoms: No data recorded Affect: Congruent; Appropriate   Thought Process  Thought Processes: Coherent  Descriptions of Associations:Intact  Orientation:Full (Time, Place and Person) (and situation)  Thought Content:WDL  History of Schizophrenia/Schizoaffective disorder:No data recorded Duration of Psychotic Symptoms:No data recorded Hallucinations:Hallucinations: None  Ideas of Reference:None  Suicidal Thoughts:Suicidal Thoughts: No  Homicidal Thoughts:Homicidal Thoughts: No   Sensorium  Memory: Immediate Good; Remote Good  Judgment: Fair  Insight: Fair   Art therapist  Concentration: Good  Attention Span: Fair  Recall: Good  Fund of Knowledge: Good  Language: Good   Psychomotor Activity  Psychomotor Activity: Psychomotor Activity: Normal   Assets  Assets: Communication Skills; Desire for Improvement; Financial Resources/Insurance; Resilience; Housing   Sleep  Sleep: Sleep: Fair Number of Hours of Sleep: 6   Physical Exam: Physical Exam Vitals and nursing note reviewed.  Constitutional:      Appearance: Normal appearance.  HENT:     Head: Normocephalic.     Nose: Nose normal.  Pulmonary:     Effort: Pulmonary effort is normal.  Musculoskeletal:         General: Normal range of motion.     Cervical back: Normal range of motion.  Neurological:     General: No focal deficit present.     Mental Status: She is alert and oriented to person, place, and time.  Psychiatric:        Attention and Perception: Attention and perception normal.        Mood and Affect: Mood and affect normal.        Speech: Speech normal.        Behavior: Behavior normal. Behavior is cooperative.        Thought Content: Thought content normal.        Cognition and Memory: Cognition and memory normal.        Judgment: Judgment normal.    Review of Systems  All other systems reviewed and are negative.  Blood pressure 137/86, pulse 91, temperature (!) 97.2 F (36.2 C), resp. rate 17, height 5\' 1"  (1.549 m), weight 72.9 kg, SpO2 95%. Body mass index is 30.38 kg/m.  Mental Status Per Nursing Assessment::   On Admission:  Suicidal ideation indicated by patient, Self-harm behaviors  Demographic Factors:  NA  Loss Factors: NA  Historical Factors: Family history of mental illness or substance abuse and Impulsivity  Risk Reduction Factors:   Responsible for children under 47 years of age, Sense of responsibility to family, Religious beliefs about death, and Positive social support  Continued Clinical Symptoms:  Depression:   Insomnia Chronic Pain Medical Diagnoses and Treatments/Surgeries  Cognitive Features That Contribute To Risk:  None    Suicide Risk:  Minimal: No identifiable suicidal ideation.  Patients presenting with no risk factors but with morbid ruminations; may be classified as minimal risk based on the severity of the depressive symptoms   Follow-up  Information     [facility]   Why: Appointment is scheudled for 04/03/2023 at 12:00PM Contact information: [address] [place] [address] [phone]                 Plan Of Care/Follow-up recommendations:  Activity:  as tolerated Diet:  heart  healthy Duloxetine (Cymbalta): 90 mg, oral, nightly for depression and/or anxiety. Quetiapine (Seroquel): 300 mg, oral, at bedtime for mood stabilization and/or sleep improvement. Clonazepam (Klonopin): 0.5 mg, oral, BID PRN for anxiety or acute panic episodes. AmLODIPine (Norvasc): 5 mg, oral, daily for hypertension. Atorvastatin (Lipitor): 40 mg, oral, daily for hyperlipidemia. Lisinopril (Zestril): 20 mg, oral, daily for hypertension. GlipiZIDE (Glucotrol): 10 mg, oral, BID for diabetes management. Insulin glargine (Semglee): 50 Units, subcutaneous, QHS for diabetes management. Naloxone (Narcan): 4 mg/0.1 mL, nasal spray, PRN for opioid emergencies. Brimonidine (Alphagan): 1 drop, both eyes, BID for glaucoma. Appointment scheduled with [facility] for 04/03/2023 at 12:00 PM. Address: [address], [place], [place] [address] Provide patient with contact information for 24-hour crisis support lines and local emergency services. Refrain from driving or operating machinery until dizziness resolves. AOxyContin: 10 mg, oral, every 12 hours as needed for pain. Percocet: 5/325 mg, oral, every 8 hours as needed for pain.void family stressors as much as possible; seek family therapy support if needed. Follow up with PCP for further medical consult in 7 days  Terri Forehand, NP 03/30/2023, 11:14 AM

## 2023-03-30 NOTE — Progress Notes (Signed)
Pt is alert and oriented X4. Affect sad and irritable. Pt has voiced unhappiness due to other patients listening to rap music all day long and not letting her watch some of her shows. When nurse offered her an alternative option , which was for her to watch in the extra day room , she refused saying its ok. Pt said " I just want to do my time and get out of here." Denies Anxiety/depression SI/HI/AVH. Pts blood sugar was elevated , she received her long acting insulin.    Scheduled medications  administered to patient  per MD orders.Support and  encouragement provided . Routine safety checks conducted every 15 minutes without incident. Patient informed to notify staff with problems  or concerns  and verbalizes understanding.    No adverse drug reaction noted , Pt compliant with, medications and treatment plan. Pt is receptive, calm, cooperative  and interacts well with staff and peers. Pt contracts for safety and remains safe and remains safe on the unit at this time.

## 2023-03-30 NOTE — Progress Notes (Signed)
  [facility] Adult Case Management Discharge Plan :  Will you be returning to the same living situation after discharge:  Yes,  [address]  [place]  At discharge, do you have transportation home?: Yes,  The patient stated that her husband will pick her up. Do you have the ability to pay for your medications: Yes,  The patient stated yes.  Release of information consent forms completed and in the chart;  Patient's signature needed at discharge.  Patient to Follow up at:  Follow-up Information     [facility]   Why: Appointment is scheudled for 04/03/2023 at 12:00PM Contact information: [address] [place] [address] [phone]                 Next level of care provider has access to [facility] Link:yes  Safety Planning and Suicide Prevention discussed: Yes,   Jarissa Jividen [phone]     Has patient been referred to the Quitline?: Patient does not use tobacco/nicotine products  Patient has been referred for addiction treatment: Yes, the patient will follow up with an outpatient provider for substance use disorder. Therapist: appointment made  Marshell Levan, LCSW 03/30/2023, 9:46 AM

## 2023-03-30 NOTE — Plan of Care (Signed)

## 2023-03-30 NOTE — Discharge Summary (Addendum)
Physician Discharge Summary Note  Patient:  Terri Woods is an 52 y.o., female MRN:  [id number] DOB:  1970-05-22 Patient phone:  [phone] (home)  Patient address:   [address] [place] [address],  Total Time spent with patient: 2 hours  Date of Admission:  03/26/2023 Date of Discharge: 03/30/2023  Reason for Admission:   52 year old Caucasian female, was admitted for evaluation and management of acute emotional distress, self-harm behavior, and symptoms of a recent concussion. She presented with elevated anxiety and frustration related to family stressors, resulting in transient suicidal ideation and superficial self-inflicted lacerations on her forearm. Additionally, the patient reported multiple head injuries over the past three days, causing symptoms of blurry vision, headache, dizziness, and nausea. These incidents, combined with her emotional distress, prompted the need for stabilization, assessment of head trauma, and adjustment of her psychiatric medication regimen.  Principal Problem: MDD (major depressive disorder) Discharge Diagnoses: Principal Problem:   MDD (major depressive disorder) Active Problems:   Suicidal ideation   Chronic prescription opiate use   Past Psychiatric History: Anxiety Chronic Pain, Depression  Past Medical History:  Past Medical History:  Diagnosis Date   Arthritis    neck, hips, fingers   Asthma    Back pain    Breast cancer (HCC) 2001   RT LUMPECTOMY PER PT   Carpal tunnel syndrome    right arm   Crohn's disease (HCC)    Diabetes (HCC)    Fibromyalgia    GERD (gastroesophageal reflux disease)    Glaucoma    Headache    Hypertension     Past Surgical History:  Procedure Laterality Date   BILATERAL CARPAL TUNNEL RELEASE Bilateral 02/05/2019   Procedure: BILATERAL CARPAL TUNNEL RELEASE;  Surgeon: Kennedy Bucker, MD;  Location: [facility];  Service: Orthopedics;  Laterality: Bilateral;   BREAST EXCISIONAL BIOPSY Right 15+ yrs ago    NEG   BREAST SURGERY     CATARACT EXTRACTION W/PHACO Right 10/30/2021   Procedure: CATARACT EXTRACTION PHACO AND INTRAOCULAR LENS PLACEMENT (IOC) RIGHT DIABETIC 1.85 00:20.9;  Surgeon: Nevada Crane, MD;  Location: [facility];  Service: Ophthalmology;  Laterality: Right;  Diabetic   COLONOSCOPY WITH PROPOFOL N/A 03/10/2020   Procedure: COLONOSCOPY WITH PROPOFOL;  Surgeon: Sung Amabile, DO;  Location: [facility];  Service: General;  Laterality: N/A;   FOOT SURGERY Left    LYMPH NODE BIOPSY     NASAL SEPTOPLASTY W/ TURBINOPLASTY Bilateral 12/25/2018   Procedure: NASAL SEPTOPLASTY WITH INFERIOR TURBINATE REDUCTION;  Surgeon: Vernie Murders, MD;  Location: [facility];  Service: ENT;  Laterality: Bilateral;   SHOULDER SURGERY     SMALL INTESTINE SURGERY     Family History:  Family History  Problem Relation Age of Onset   Heart attack Sister 65   Heart disease Father    Breast cancer Mother    Breast cancer Maternal Aunt    Family Psychiatric  History: none reported Social History:  Social History   Substance and Sexual Activity  Alcohol Use No   Alcohol/week: 0.0 standard drinks of alcohol     Social History   Substance and Sexual Activity  Drug Use Yes   Types: Oxycodone    Social History   Socioeconomic History   Marital status: Married    Spouse name: Not on file   Number of children: Not on file   Years of education: Not on file   Highest education level: Not on file  Occupational History   Not  on file  Tobacco Use   Smoking status: Never   Smokeless tobacco: Never  Vaping Use   Vaping status: Never Used  Substance and Sexual Activity   Alcohol use: No    Alcohol/week: 0.0 standard drinks of alcohol   Drug use: Yes    Types: Oxycodone   Sexual activity: Yes    Birth control/protection: None    Comment: Married  Other Topics Concern   Not on file  Social History Narrative   Not on file   Social Determinants of Health    Financial Resource Strain: Low Risk  (05/22/2022)   Received from [facility]   Overall Financial Resource Strain (CARDIA)    Difficulty of Paying Living Expenses: Not hard at all  Food Insecurity: No Food Insecurity (03/26/2023)   Hunger Vital Sign    Worried About Running Out of Food in the Last Year: Never true    Ran Out of Food in the Last Year: Never true  Transportation Needs: No Transportation Needs (03/26/2023)   PRAPARE - Administrator, Civil Service (Medical): No    Lack of Transportation (Non-Medical): No  Physical Activity: Not on file  Stress: Not on file  Social Connections: Not on file    Hospital Course:  The patient  was admitted for management of acute emotional distress, transient suicidal ideation, and self-harm behavior, along with symptoms of a recent concussion. On admission, she reported hitting her head twice in three days, with ongoing symptoms including dizziness, headache, blurry vision, and nausea. She denied any loss of consciousness. The patient also described emotional distress related to family stressors, leading to self-inflicted superficial lacerations on her forearm and feelings of hopelessness. The patient underwent a thorough medical evaluation, including head trauma assessment. Imaging and lab work conducted prior to admission ruled out acute traumatic injuries. Her post-concussion symptoms were monitored closely, with a focus on managing dizziness, headaches, and nausea.The patient denied active suicidal or homicidal ideation, hallucinations, or paranoia during admission. She was observed to be cooperative and engaged in care. Her medication regimen was adjusted to include:: The patient's chronic opioid use was closely monitored throughout her stay. Adjustments were made to her pain management regimen to ensure appropriate use of OxyContin and Percocet, with an emphasis on minimizing dependency while managing her chronic  back pain and acute discomfort. She was educated on the risks of long-term opioid use and strategies to manage pain effectively with the lowest effective doses.The patient underwent a thorough medical evaluation, including head trauma assessment. Imaging and lab work conducted prior to admission ruled out acute traumatic injuries. Her post-concussion symptoms were monitored closely, with a focus on managing dizziness, headaches, and nausea.The patient denied active suicidal or homicidal ideation, hallucinations, or paranoia during admission. She was observed to be cooperative and engaged in care. Her medication regimen was adjusted to include: The patient was provided with crisis intervention and supportive counseling to address her acute emotional distress. She engaged in discussions about her stressors and coping strategies.: Throughout her stay, the patient denied ongoing suicidal ideation, homicidal ideation, or psychotic symptoms. No further self-injurious behavior was observed. She remained compliant with her treatment plan. The patient expressed concerns regarding family stressors, including her husband's car trouble and her inability to assist, which were addressed during sessions to provide emotional validation and problem-solving strategies.The patient was deemed stable for discharge with a follow-up appointment scheduled with [facility] on 04/03/2023 and her primary care provider in 10  days. She was educated on her medication regimen, including the proper use of pain medications, and provided with crisis resource information for additional support if needed.    Musculoskeletal: Strength & Muscle Tone: within normal limits Gait & Station: normal Patient leans: N/A   Psychiatric Specialty Exam:  Presentation  General Appearance:  Appropriate for Environment; Neat  Eye Contact: Minimal  Speech: Clear and Coherent; Normal Rate  Speech  Volume: Normal  Handedness: Right   Mood and Affect  Mood: Euthymic  Affect: Congruent; Appropriate   Thought Process  Thought Processes: Coherent  Descriptions of Associations:Intact  Orientation:Full (Time, Place and Person) (and situation)  Thought Content:WDL  History of Schizophrenia/Schizoaffective disorder:No data recorded Duration of Psychotic Symptoms:No data recorded Hallucinations:Hallucinations: None  Ideas of Reference:None  Suicidal Thoughts:Suicidal Thoughts: No  Homicidal Thoughts:Homicidal Thoughts: No   Sensorium  Memory: Immediate Good; Remote Good  Judgment: Fair  Insight: Fair   Art therapist  Concentration: Good  Attention Span: Fair  Recall: Good  Fund of Knowledge: Good  Language: Good   Psychomotor Activity  Psychomotor Activity: Psychomotor Activity: Normal   Assets  Assets: Communication Skills; Desire for Improvement; Financial Resources/Insurance; Resilience; Housing   Sleep  Sleep: Sleep: Fair Number of Hours of Sleep: 6    Physical Exam: Physical Exam Vitals and nursing note reviewed.  Constitutional:      Appearance: Normal appearance.  HENT:     Head: Normocephalic and atraumatic.     Nose: Nose normal.  Pulmonary:     Effort: Pulmonary effort is normal.  Musculoskeletal:        General: Normal range of motion.     Cervical back: Normal range of motion.  Neurological:     General: No focal deficit present.     Mental Status: She is alert.  Psychiatric:        Attention and Perception: Attention and perception normal.        Mood and Affect: Mood and affect normal.        Speech: Speech normal.        Behavior: Behavior normal. Behavior is cooperative.        Thought Content: Thought content normal.        Cognition and Memory: Cognition and memory normal.    Review of Systems  All other systems reviewed and are negative.  Blood pressure 137/86, pulse 91, temperature (!)  97.2 F (36.2 C), resp. rate 17, height 5\' 1"  (1.549 m), weight 72.9 kg, SpO2 95%. Body mass index is 30.38 kg/m.   Social History   Tobacco Use  Smoking Status Never  Smokeless Tobacco Never   Tobacco Cessation:  N/A, patient does not currently use tobacco products   Blood Alcohol level:  Lab Results  Component Value Date   ETH <10 03/25/2023      See Psychiatric Specialty Exam and Suicide Risk Assessment completed by Attending Physician prior to discharge.  Discharge destination:  Home  Is patient on multiple antipsychotic therapies at discharge:  No   Has Patient had three or more failed trials of antipsychotic monotherapy by history:  No  Recommended Plan for Multiple Antipsychotic Therapies: Taper to monotherapy as described:  currently on seroquel   Allergies as of 03/30/2023       Reactions   Exenatide Anaphylaxis   Levodopa Hives   Lyrica [pregabalin] Anaphylaxis, Hives   Other Anaphylaxis   Mace    Rotigotine Hives   Erythromycin Rash, Other (See Comments)   GI Upset  Bee Venom Swelling   Fentanyl Itching   Hysingla Er [hydrocodone Bitartrate Er] Itching   Meloxicam Itching   Doxycycline Rash   Morphine Rash   Penicillins Hives, Nausea And Vomiting, Rash   Semaglutide Hives, Swelling   Hives, diarrhea, vomiting   Shellfish Allergy Rash        Medication List     STOP taking these medications    insulin glargine 100 UNIT/ML injection Commonly known as: LANTUS Replaced by: insulin glargine-yfgn 100 UNIT/ML injection       TAKE these medications      Indication  amLODipine 5 MG tablet Commonly known as: NORVASC Take 1 tablet (5 mg total) by mouth daily. Start taking on: March 31, 2023  Indication: High Blood Pressure   aspirin EC 81 MG tablet Take 1 tablet (81 mg total) by mouth daily. Swallow whole. Start taking on: March 31, 2023  Indication: Disease involving Lipid Deposits in the Arteries   atorvastatin 40 MG  tablet Commonly known as: LIPITOR Take 40 mg by mouth daily.  Indication: High Amount of Fats in the Blood   brimonidine 0.2 % ophthalmic solution Commonly known as: ALPHAGAN Place 1 drop into both eyes 2 (two) times daily. What changed:  how much to take when to take this  Indication: Increased Pressure Within the Eye   clonazePAM 0.5 MG tablet Commonly known as: KLONOPIN Take 1 tablet (0.5 mg total) by mouth 2 (two) times daily as needed for up to 15 days (anxiety). What changed:  medication strength how much to take when to take this reasons to take this  Indication: Feeling Anxious   cyclobenzaprine 7.5 MG tablet Commonly known as: FEXMID Take 1 tablet (7.5 mg total) by mouth 2 (two) times daily as needed for muscle spasms. What changed:  medication strength how much to take when to take this  Indication: Muscle Spasm   docusate sodium 100 MG capsule Commonly known as: COLACE Take 1 capsule (100 mg total) by mouth daily for 10 days. Start taking on: March 31, 2023  Indication: Constipation   DULoxetine 30 MG capsule Commonly known as: CYMBALTA Take 2 capsules (60 mg total) by mouth at bedtime AND 1 capsule (30 mg total) at bedtime. What changed:  medication strength See the new instructions. Another medication with the same name was removed. Continue taking this medication, and follow the directions you see here.  Indication: Major Depressive Disorder, Musculoskeletal Pain, Peripheral Nerve Disease   EPINEPHrine 0.3 mg/0.3 mL Soaj injection Commonly known as: EpiPen 2-Pak Inject 0.3 mg into the muscle as needed.  Indication: Life-Threatening Hypersensitivity Reaction   glipiZIDE 10 MG tablet Commonly known as: GLUCOTROL Take 1 tablet (10 mg total) by mouth 2 (two) times daily with a meal. What changed:  medication strength when to take this  Indication: Type 2 Diabetes   insulin glargine-yfgn 100 UNIT/ML injection Commonly known as: SEMGLEE Inject  0.5 mLs (50 Units total) into the skin at bedtime. Replaces: insulin glargine 100 UNIT/ML injection  Indication: Type 2 Diabetes   lisinopril 20 MG tablet Commonly known as: ZESTRIL Take 1 tablet (20 mg total) by mouth daily. Start taking on: March 31, 2023 What changed:  medication strength how much to take when to take this  Indication: High Blood Pressure   naloxone 4 MG/0.1ML Liqd nasal spray kit Commonly known as: NARCAN Place 1 spray into the nose as needed (accidental overdose).  Indication: Opioid Overdose   oxyCODONE 10 mg 12 hr tablet Commonly known  as: OXYCONTIN Take 1 tablet (10 mg total) by mouth 2 (two) times daily as needed for up to 7 days (severe pain over 6). What changed:  when to take this reasons to take this  Indication: Chronic Pain   oxyCODONE-acetaminophen 5-325 MG tablet Commonly known as: PERCOCET/ROXICET Take 1 tablet by mouth every 8 (eight) hours as needed for up to 5 days for moderate pain (pain score 4-6) (pain.). What changed:  when to take this reasons to take this  Indication: Pain   polyethylene glycol 17 g packet Commonly known as: MIRALAX / GLYCOLAX Take 17 g by mouth daily as needed for up to 14 days for moderate constipation.  Indication: Chronic Constipation of Unknown Cause, Constipation   QUEtiapine 300 MG tablet Commonly known as: SEROQUEL Take 1 tablet (300 mg total) by mouth at bedtime.  Indication: Generalized Anxiety Disorder, Trouble Sleeping   SUMAtriptan 100 MG tablet Commonly known as: IMITREX Take 100 mg by mouth every 2 (two) hours as needed for migraine. May repeat in 2 hours if headache persists or recurs.  Indication: Migraine Headache        Follow-up Information     [facility].   Why: Appointment is scheudled for 04/03/2023 at 12:00PM Contact information: [address] [place] [address] [phone]                 Follow-up recommendations:   Activity:  as tolerated Diet:  heart healthy  Comments:   Duloxetine (Cymbalta): 90 mg, oral, nightly for depression and/or anxiety. Quetiapine (Seroquel): 300 mg, oral, at bedtime for mood stabilization and/or sleep improvement. Clonazepam (Klonopin): 0.5 mg, oral, BID PRN for anxiety or acute panic episodes. AmLODIPine (Norvasc): 5 mg, oral, daily for hypertension. Atorvastatin (Lipitor): 40 mg, oral, daily for hyperlipidemia. Lisinopril (Zestril): 20 mg, oral, daily for hypertension. GlipiZIDE (Glucotrol): 10 mg, oral, BID for diabetes management. Insulin glargine (Semglee): 50 Units, subcutaneous, QHS for diabetes management. Naloxone (Narcan): 4 mg/0.1 mL, nasal spray, PRN for opioid emergencies. Brimonidine (Alphagan): 1 drop, both eyes, BID for glaucoma. Appointment scheduled with [facility] for 04/03/2023 at 12:00 PM. Address: [address], [place], [place] [address] Provide patient with contact information for 24-hour crisis support lines and local emergency services. Refrain from driving or operating machinery until dizziness resolves. OxyContin: 10 mg, oral, every 12 hours as needed for pain. Percocet: 5/325 mg, oral, every 8 hours as needed for pain. Avoid family stressors as much as possible; seek family therapy support if needed. Follow up with PCP for further medical consult in 7 days Signed: [name], NP 03/30/2023, 11:41 AM

## 2023-05-09 ENCOUNTER — Ambulatory Visit
Admission: EM | Admit: 2023-05-09 | Discharge: 2023-05-09 | Disposition: A | Discharge status: Home / Self Care | Payer: 59 | Attending: Emergency Medicine | Admitting: Emergency Medicine

## 2023-05-09 ENCOUNTER — Encounter: Payer: Self-pay | Admitting: Emergency Medicine

## 2023-05-09 ENCOUNTER — Ambulatory Visit (INDEPENDENT_AMBULATORY_CARE_PROVIDER_SITE_OTHER): Payer: 59

## 2023-05-09 DIAGNOSIS — H6993 Unspecified Eustachian tube disorder, bilateral: Secondary | ICD-10-CM | POA: Diagnosis present

## 2023-05-09 DIAGNOSIS — J101 Influenza due to other identified influenza virus with other respiratory manifestations: Secondary | ICD-10-CM | POA: Insufficient documentation

## 2023-05-09 DIAGNOSIS — R42 Dizziness and giddiness: Secondary | ICD-10-CM | POA: Diagnosis present

## 2023-05-09 DIAGNOSIS — J45901 Unspecified asthma with (acute) exacerbation: Secondary | ICD-10-CM | POA: Insufficient documentation

## 2023-05-09 DIAGNOSIS — R0602 Shortness of breath: Secondary | ICD-10-CM | POA: Diagnosis present

## 2023-05-09 LAB — RESP PANEL BY RT-PCR (RSV, FLU A&B, COVID)  RVPGX2
Influenza A by PCR: POSITIVE — AB
Influenza B by PCR: NEGATIVE
Resp Syncytial Virus by PCR: NEGATIVE
SARS Coronavirus 2 by RT PCR: NEGATIVE

## 2023-05-09 MED ORDER — IBUPROFEN 600 MG PO TABS: 600.0000 mg | ORAL_TABLET | Freq: Three times a day (TID) | ORAL | 0 refills | Status: DC | PRN

## 2023-05-09 MED ORDER — PROMETHAZINE-DM 6.25-15 MG/5ML PO SYRP: 5.0000 mL | ORAL_SOLUTION | Freq: Four times a day (QID) | ORAL | 0 refills | Status: DC | PRN

## 2023-05-09 MED ORDER — PREDNISONE 20 MG PO TABS
40.0000 mg | ORAL_TABLET | Freq: Every day | ORAL | 0 refills | Status: AC
Start: 2023-05-09 — End: 2023-05-14

## 2023-05-09 MED ORDER — OSELTAMIVIR PHOSPHATE 75 MG PO CAPS: 75.0000 mg | ORAL_CAPSULE | Freq: Two times a day (BID) | ORAL | 0 refills | Status: DC

## 2023-05-09 MED ORDER — FLUTICASONE PROPIONATE 50 MCG/ACT NA SUSP: 2.0000 | Freq: Every day | NASAL | 0 refills | Status: AC

## 2023-05-09 MED ORDER — ALBUTEROL SULFATE HFA 108 (90 BASE) MCG/ACT IN AERS: 1.0000 | INHALATION_SPRAY | RESPIRATORY_TRACT | 0 refills | Status: AC | PRN

## 2023-05-09 NOTE — ED Triage Notes (Signed)
Pt presents with dizziness, headache, cough and bodyaches x 2 days. Pt has tried OTC cold medication for her symptoms.

## 2023-05-09 NOTE — ED Provider Notes (Signed)
HPI  SUBJECTIVE:  Terri Woods is a 52 y.o. female who presents with 2 days of bodyaches, productive cough with yellow sputum, fevers Tmax 102, headaches, nasal congestion, yellow rhinorrhea, sinus pain and pressure, wheezing, dyspnea on exertion, decreased p.o. intake.  She reports sore throat, burning chest pain with coughing,  shortness of breath secondary to the cough.  She reports intermittent, minutes long vertigo occurring when she sits up accompanied with nausea.  She reports ear popping when she coughs or swallows.  No ear pain, tinnitus, vomiting.  No diarrhea, abdominal pain.  She is unable to sleep at night because of the cough.  Her husband had similar symptoms last week, but has gotten better.  No known COVID or flu exposure.  She did not get the COVID or flu vaccines.  She took Tylenol within 6 hours of evaluation.  No antibiotics in the past 3 months.  She has been taking DayQuil, NyQuil, Tylenol and using her albuterol 4-5 times per day.  She does not have a spacer.  The Tylenol helps.  Symptoms are worse with coughing, and walking.  She has a past medical history of Crohn's, diabetes, fibromyalgia, asthma last admission in 2023, GERD, hypertension, breast cancer, vertigo.  PCP: [facility] [place]  Past Medical History:  Diagnosis Date   Arthritis    neck, hips, fingers   Asthma    Back pain    Breast cancer (HCC) 2001   RT LUMPECTOMY PER PT   Carpal tunnel syndrome    right arm   Crohn's disease (HCC)    Diabetes (HCC)    Fibromyalgia    GERD (gastroesophageal reflux disease)    Glaucoma    Headache    Hypertension     Past Surgical History:  Procedure Laterality Date   BILATERAL CARPAL TUNNEL RELEASE Bilateral 02/05/2019   Procedure: BILATERAL CARPAL TUNNEL RELEASE;  Surgeon: Kennedy Bucker, MD;  Location: [facility];  Service: Orthopedics;  Laterality: Bilateral;   BREAST EXCISIONAL BIOPSY Right 15+ yrs ago   NEG   BREAST SURGERY     CATARACT EXTRACTION W/PHACO  Right 10/30/2021   Procedure: CATARACT EXTRACTION PHACO AND INTRAOCULAR LENS PLACEMENT (IOC) RIGHT DIABETIC 1.85 00:20.9;  Surgeon: Nevada Crane, MD;  Location: [facility];  Service: Ophthalmology;  Laterality: Right;  Diabetic   COLONOSCOPY WITH PROPOFOL N/A 03/10/2020   Procedure: COLONOSCOPY WITH PROPOFOL;  Surgeon: Sung Amabile, DO;  Location: [facility];  Service: General;  Laterality: N/A;   FOOT SURGERY Left    LYMPH NODE BIOPSY     NASAL SEPTOPLASTY W/ TURBINOPLASTY Bilateral 12/25/2018   Procedure: NASAL SEPTOPLASTY WITH INFERIOR TURBINATE REDUCTION;  Surgeon: Vernie Murders, MD;  Location: [facility];  Service: ENT;  Laterality: Bilateral;   SHOULDER SURGERY     SMALL INTESTINE SURGERY      Family History  Problem Relation Age of Onset   Heart attack Sister 30   Heart disease Father    Breast cancer Mother    Breast cancer Maternal Aunt     Social History   Tobacco Use   Smoking status: Never   Smokeless tobacco: Never  Vaping Use   Vaping status: Never Used  Substance Use Topics   Alcohol use: No    Alcohol/week: 0.0 standard drinks of alcohol   Drug use: Yes    Types: Oxycodone    No current facility-administered medications for this encounter.  Current Outpatient Medications:    albuterol (VENTOLIN HFA) 108 (90 Base) MCG/ACT  inhaler, Inhale 1-2 puffs into the lungs every 4 (four) hours as needed for wheezing or shortness of breath., Disp: 1 each, Rfl: 0   fluticasone (FLONASE) 50 MCG/ACT nasal spray, Place 2 sprays into both nostrils daily., Disp: 16 g, Rfl: 0   ibuprofen (ADVIL) 600 MG tablet, Take 1 tablet (600 mg total) by mouth every 8 (eight) hours as needed., Disp: 30 tablet, Rfl: 0   oseltamivir (TAMIFLU) 75 MG capsule, Take 1 capsule (75 mg total) by mouth 2 (two) times daily. X 5 days, Disp: 10 capsule, Rfl: 0   predniSONE (DELTASONE) 20 MG tablet, Take 2 tablets (40 mg total) by mouth daily with breakfast for 5 days., Disp: 10  tablet, Rfl: 0   promethazine-dextromethorphan (PROMETHAZINE-DM) 6.25-15 MG/5ML syrup, Take 5 mLs by mouth 4 (four) times daily as needed for cough., Disp: 118 mL, Rfl: 0   amLODipine (NORVASC) 5 MG tablet, Take 1 tablet (5 mg total) by mouth daily., Disp: 30 tablet, Rfl: 0   atorvastatin (LIPITOR) 40 MG tablet, Take 40 mg by mouth daily., Disp: , Rfl:    clonazePAM (KLONOPIN) 0.5 MG tablet, Take 1 tablet (0.5 mg total) by mouth 2 (two) times daily as needed for up to 15 days (anxiety)., Disp: 30 tablet, Rfl: 0   DULoxetine (CYMBALTA) 30 MG capsule, Take 2 capsules (60 mg total) by mouth at bedtime AND 1 capsule (30 mg total) at bedtime., Disp: 90 capsule, Rfl: 0   EPINEPHrine (EPIPEN 2-PAK) 0.3 mg/0.3 mL IJ SOAJ injection, Inject 0.3 mg into the muscle as needed. (Patient not taking: Reported on 03/26/2023), Disp: 1 each, Rfl: 1   glipiZIDE (GLUCOTROL) 10 MG tablet, Take 1 tablet (10 mg total) by mouth 2 (two) times daily with a meal., Disp: 60 tablet, Rfl: 0   lisinopril (ZESTRIL) 20 MG tablet, Take 1 tablet (20 mg total) by mouth daily., Disp: 30 tablet, Rfl: 0   naloxone (NARCAN) nasal spray 4 mg/0.1 mL, Place 1 spray into the nose as needed (accidental overdose)., Disp: , Rfl:    QUEtiapine (SEROQUEL) 300 MG tablet, Take 1 tablet (300 mg total) by mouth at bedtime., Disp: 30 tablet, Rfl: 0   SUMAtriptan (IMITREX) 100 MG tablet, Take 100 mg by mouth every 2 (two) hours as needed for migraine. May repeat in 2 hours if headache persists or recurs., Disp: , Rfl:   Allergies  Allergen Reactions   Exenatide Anaphylaxis   Levodopa Hives   Lyrica [Pregabalin] Anaphylaxis and Hives   Other Anaphylaxis    Mace    Rotigotine Hives   Erythromycin Rash and Other (See Comments)    GI Upset   Bee Venom Swelling   Fentanyl Itching   Hysingla Er [Hydrocodone Bitartrate Er] Itching   Meloxicam Itching   Doxycycline Rash   Morphine Rash   Penicillins Hives, Nausea And Vomiting and Rash   Semaglutide  Hives and Swelling    Hives, diarrhea, vomiting   Shellfish Allergy Rash     ROS  As noted in HPI.   Physical Exam  BP (!) 165/86 (BP Location: Left Arm)   Pulse 90   Temp 98.9 F (37.2 C) (Oral)   Resp 18   SpO2 95%   Constitutional: Well developed, well nourished, appears ill.  Coughing. Eyes:  EOMI, conjunctiva normal bilaterally HENT: Normocephalic, atraumatic,mucus membranes moist.  Purulent nasal congestion.  Positive maxillary, frontal sinus tenderness.  Erythematous, swollen turbinates.  TMs normal bilaterally.  Normal oropharynx.  No postnasal drip. Neck: Positive cervical  lymphadenopathy Respiratory: Normal inspiratory effort, diffuse expiratory wheezing throughout all lung fields.  Positive anterior, lateral chest wall tenderness Cardiovascular: Normal rate, regular rhythm, no murmurs rubs or gallops GI: nondistended, diffuse mild abdominal tenderness. skin: No rash, skin intact Musculoskeletal: no deformities Neurologic: Alert & oriented x 3, no focal neuro deficits.  Finger-nose, heel shin within normal limits. Psychiatric: Speech and behavior appropriate   ED Course   Medications - No data to display  Orders Placed This Encounter  Procedures   Resp panel by RT-PCR (RSV, Flu A&B, Covid) Anterior Nasal Swab    Standing Status:   Standing    Number of Occurrences:   1   DG Chest 2 View    Standing Status:   Standing    Number of Occurrences:   1    Reason for Exam (SYMPTOM  OR DIAGNOSIS REQUIRED):   cough and fever with sob x 2 days    Is patient pregnant?:   No   Airborne and Contact precautions    Standing Status:   Standing    Number of Occurrences:   1    Results for orders placed or performed during the hospital encounter of 05/09/23 (from the past 24 hours)  Resp panel by RT-PCR (RSV, Flu A&B, Covid) Anterior Nasal Swab     Status: Abnormal   Collection Time: 05/09/23  6:25 PM   Specimen: Anterior Nasal Swab  Result Value Ref Range   SARS  Coronavirus 2 by RT PCR NEGATIVE NEGATIVE   Influenza A by PCR POSITIVE (A) NEGATIVE   Influenza B by PCR NEGATIVE NEGATIVE   Resp Syncytial Virus by PCR NEGATIVE NEGATIVE   DG Chest 2 View Result Date: 05/09/2023 CLINICAL DATA:  Cough, fever, and shortness of breath for 2 days. EXAM: CHEST - 2 VIEW COMPARISON:  05/08/2022 FINDINGS: The heart size and mediastinal contours are within normal limits. Both lungs are clear. Internal fixation plate and screws again seen in the left clavicle. IMPRESSION: No active cardiopulmonary disease. Electronically Signed   By: Danae Orleans M.D.   On: 05/09/2023 18:48    ED Clinical Impression  1. Influenza A   2. Shortness of breath   3. Moderate asthma with acute exacerbation, unspecified whether persistent   4. Vertigo   5. Dysfunction of both eustachian tubes      ED Assessment/Plan    Influenza A positive.  I suspect that this and eustachian tube are causing her vertigo.  Home with Flonase, Mucinex, push fluids.  Tamiflu.  I am also concerned about an asthma exacerbation as well given her admission to the hospital for an asthma exacerbation last year.  She denies intubations or recent steroid use..  She does not have a pneumonia on x-ray.  Home with regularly scheduled albuterol inhaler with a spacer for 4 days, then as needed thereafter, prednisone 40 mg for 5 days.  1000 mg Tylenol combined with 600 mg ibuprofen 3 times a day.  Push electrolyte containing fluids.  Reviewed imaging independently.  No pneumonia.  See radiology report for full details.  Discussed labs, imaging, MDM, treatment plan, and plan for follow-up with patient and family member.  Discussed sn/sx that should prompt return to the ED. they agree with plan.  Meds ordered this encounter  Medications   predniSONE (DELTASONE) 20 MG tablet    Sig: Take 2 tablets (40 mg total) by mouth daily with breakfast for 5 days.    Dispense:  10 tablet    Refill:  0   albuterol (VENTOLIN HFA)  108 (90 Base) MCG/ACT inhaler    Sig: Inhale 1-2 puffs into the lungs every 4 (four) hours as needed for wheezing or shortness of breath.    Dispense:  1 each    Refill:  0   fluticasone (FLONASE) 50 MCG/ACT nasal spray    Sig: Place 2 sprays into both nostrils daily.    Dispense:  16 g    Refill:  0   promethazine-dextromethorphan (PROMETHAZINE-DM) 6.25-15 MG/5ML syrup    Sig: Take 5 mLs by mouth 4 (four) times daily as needed for cough.    Dispense:  118 mL    Refill:  0   oseltamivir (TAMIFLU) 75 MG capsule    Sig: Take 1 capsule (75 mg total) by mouth 2 (two) times daily. X 5 days    Dispense:  10 capsule    Refill:  0   ibuprofen (ADVIL) 600 MG tablet    Sig: Take 1 tablet (600 mg total) by mouth every 8 (eight) hours as needed.    Dispense:  30 tablet    Refill:  0      *This clinic note was created using Scientist, clinical (histocompatibility and immunogenetics). Therefore, there may be occasional mistakes despite careful proofreading.  ?    Domenick Gong, MD 05/10/23 1726

## 2023-05-09 NOTE — Discharge Instructions (Signed)
Take two puffs from your albuterol inhaler every 4 hours for 2 days, then every 6 hours for 2 days, then as needed. You can back off if you start to improve  sooner. Finish the steroids unless your doctor tells you to stop. Finish the Tamiflu even if you feel better. You may take 600 mg of ibuprofen and 1000 mg of tylenol 3 to 4 times a day as needed for pain. This is an effective combination for pain and fever. Make sure you drink extra electrolyte containing fluids such as liquid IV, Pedialyte or Gatorade.  Flonase, saline nasal irrigation with a NeilMed sinus rinse and distilled water as often as you want, Mucinex for the nasal congestion.  Stop other over-the-counter cold medicines.  Promethazine DM as needed for cough.  If the spacer is too expensive at the pharmacy, you can get an AeroChamber Z-Stat off of [employer] for about $10-$15.  Go to www.goodrx.com  or www.costplusdrugs.com to look up your medications. This will give you a list of where you can find your prescriptions at the most affordable prices. Or ask the pharmacist what the cash price is, or if they have any other discount programs available to help make your medication more affordable. This can be less expensive than what you would pay with insurance.

## 2023-05-10 ENCOUNTER — Ambulatory Visit: Payer: 59

## 2023-05-22 ENCOUNTER — Ambulatory Visit
Admission: RE | Admit: 2023-05-22 | Discharge: 2023-05-22 | Disposition: A | Discharge status: Home / Self Care | Payer: 59 | Source: Ambulatory Visit | Attending: Student in an Organized Health Care Education/Training Program | Admitting: Student in an Organized Health Care Education/Training Program

## 2023-05-22 ENCOUNTER — Encounter: Payer: Self-pay | Admitting: Student in an Organized Health Care Education/Training Program

## 2023-05-22 ENCOUNTER — Ambulatory Visit
Payer: 59 | Attending: Student in an Organized Health Care Education/Training Program | Admitting: Student in an Organized Health Care Education/Training Program

## 2023-05-22 VITALS — BP 105/65 | HR 90 | Temp 97.9°F | Resp 19 | Ht 61.0 in | Wt 164.0 lb

## 2023-05-22 DIAGNOSIS — M51362 Other intervertebral disc degeneration, lumbar region with discogenic back pain and lower extremity pain: Secondary | ICD-10-CM | POA: Insufficient documentation

## 2023-05-22 DIAGNOSIS — M4726 Other spondylosis with radiculopathy, lumbar region: Secondary | ICD-10-CM

## 2023-05-22 DIAGNOSIS — G894 Chronic pain syndrome: Secondary | ICD-10-CM | POA: Insufficient documentation

## 2023-05-22 DIAGNOSIS — M47816 Spondylosis without myelopathy or radiculopathy, lumbar region: Secondary | ICD-10-CM | POA: Diagnosis present

## 2023-05-22 DIAGNOSIS — M5416 Radiculopathy, lumbar region: Secondary | ICD-10-CM | POA: Diagnosis present

## 2023-05-22 DIAGNOSIS — G8929 Other chronic pain: Secondary | ICD-10-CM | POA: Diagnosis present

## 2023-05-22 MED ORDER — MIDAZOLAM HCL 5 MG/5ML IJ SOLN
0.5000 mg | Freq: Once | INTRAMUSCULAR | Status: AC
  Administered 2023-05-22: 2.5 mg via INTRAVENOUS

## 2023-05-22 MED ORDER — FENTANYL CITRATE (PF) 100 MCG/2ML IJ SOLN
25.0000 ug | INTRAMUSCULAR | Status: DC | PRN
Start: 2023-05-22 — End: 2023-05-22
  Administered 2023-05-22: 75 ug via INTRAVENOUS

## 2023-05-22 MED ORDER — CEFAZOLIN SODIUM 1 G IJ SOLR
INTRAMUSCULAR | Status: AC
  Filled 2023-05-22: qty 20

## 2023-05-22 MED ORDER — LIDOCAINE HCL 2 % IJ SOLN
20.0000 mL | Freq: Once | INTRAMUSCULAR | Status: AC
  Administered 2023-05-22: 100 mg

## 2023-05-22 MED ORDER — ROPIVACAINE HCL 2 MG/ML IJ SOLN
18.0000 mL | Freq: Once | INTRAMUSCULAR | Status: AC
  Administered 2023-05-22: 18 mL via PERINEURAL

## 2023-05-22 MED ORDER — LACTATED RINGERS IV SOLN: Freq: Once | INTRAVENOUS | Status: AC

## 2023-05-22 MED ORDER — CEPHALEXIN 500 MG PO CAPS: 500.0000 mg | ORAL_CAPSULE | Freq: Four times a day (QID) | ORAL | 0 refills | Status: AC

## 2023-05-22 MED ORDER — LIDOCAINE HCL (PF) 2 % IJ SOLN
INTRAMUSCULAR | Status: AC
  Filled 2023-05-22: qty 10

## 2023-05-22 MED ORDER — FENTANYL CITRATE (PF) 100 MCG/2ML IJ SOLN
INTRAMUSCULAR | Status: AC
  Filled 2023-05-22: qty 2

## 2023-05-22 MED ORDER — MIDAZOLAM HCL 5 MG/5ML IJ SOLN
INTRAMUSCULAR | Status: AC
  Filled 2023-05-22: qty 5

## 2023-05-22 MED ORDER — ROPIVACAINE HCL 2 MG/ML IJ SOLN
INTRAMUSCULAR | Status: AC
  Filled 2023-05-22: qty 20

## 2023-05-22 MED ORDER — CEFAZOLIN SODIUM-DEXTROSE 2-4 GM/100ML-% IV SOLN
2.0000 g | Freq: Once | INTRAVENOUS | Status: AC
  Administered 2023-05-22: 2 g via INTRAVENOUS
  Filled 2023-05-22: qty 100

## 2023-05-22 NOTE — Progress Notes (Signed)
 Safety precautions to be maintained throughout the outpatient stay will include: orient to surroundings, keep bed in low position, maintain call bell within reach at all times, provide assistance with transfer out of bed and ambulation.

## 2023-05-22 NOTE — Patient Instructions (Signed)
 Today we did the following -We have done a Spinal Cord Stimulator Trial with [employer]  -As long as the leads are in place, do not bathe or shower. You may sponge bathe.  -While the lead is in place, please limit the bending, lifting, or twisting because the lead can move.  -The things we want to see is if your pain improves (and by what percentage), if you can do more activity (don't overdo it), and if you can use less of your as needed medicine. Do not stop long acting medicines like methadone, oxycontin , MS Contin, etc without checking with us .  -It is VERY important that you pick up the antibiotics we prescribed, Keflex , on your way home from the trial and take them as prescribed(4 times a day), starting today, for as long as the lead is in place.  -The Spina Cord Stimulator Representative will be in contact with you while the lead is in place to make sure the trial goes as well as possible.  -Please contact us  with any questions or concerns at any time during the trial.   -If you start running a fever over 100 degrees, have severe back pain, or new pain running down the legs, or drainage coming from the lead site, contact us  immediately and/or go to the emergency room.  -Please do not restart any sort of medication that can thin your blood such as Aspirin , ibuprofen , motrin , aleve, plavix, coumadin, etc. If you aren't sure, call and ask.  -We will have you return on Wed to have the lead removed. If this is successful, at that point we can go over the details about the permanent implant.

## 2023-05-22 NOTE — Progress Notes (Signed)
 PROVIDER NOTE: Interpretation of information contained herein should be left to medically-trained personnel. Specific patient instructions are provided elsewhere under Patient Instructions section of medical record. This document was created in part using STT-dictation technology, any transcriptional errors that may result from this process are unintentional.  Patient: Terri Woods Type: Established DOB: 02/01/1971 MRN: [id number] PCP: [facility] [place] Primary  Service: Procedure DOS: 05/22/2023 Setting: Ambulatory Location: Ambulatory outpatient facility Delivery: Face-to-face Provider: Wallie Sherry, MD Specialty: Interventional Pain Management Specialty designation: 09 Location: Outpatient facility Ref. Prov.: Sherry Wallie, MD       Interventional Therapy   Primary Reason for Admission: Surgical management of chronic pain condition.   Procedure:              Type: MEDTRONIC Trial Spinal Cord Neurostimulator Implant (Percutaneous, interlaminar, posterior epidural placement) Laterality: Bilateral (-50)  Level: Lumbar  Imaging: Fluoroscopic guidance Anesthesia: Local anesthesia (1-2% Lidocaine ) Sedation: Moderate Sedation                       DOS: 05/22/2023  Performed by: Wallie Sherry, MD  Purpose: Diagnostic. To determine if a permanent implant may be effective in controlling some or all of Terri Woods's chronic pain symptoms.  Rationale (medical necessity): procedure needed and proper for the diagnosis and/or treatment of Terri Woods's medical symptoms and needs. 1. Chronic radicular lumbar pain   2. Lumbar facet arthropathy   3. Lumbar spondylosis   4. Degeneration of intervertebral disc of lumbar region with discogenic back pain and lower extremity pain   5. Chronic pain syndrome    NAS-11 Pain score:   Pre-procedure: 7 /10   Post-procedure: 7 /10     Target: Posterior epidural space over the dorsal columns of the spinal cord. Location: Posterior intraspinal  canal Region: Thoracolumbar  Approach: Translaminar percutaneous  Type of procedure: Surgical   Position / Prep / Materials:  Position: Prone  Prep solution: ChloraPrep (2% chlorhexidine  gluconate and 70% isopropyl alcohol) Prep Area: Entire  Posterior  Thoracolumbar  Region  Materials:  Tray: Implant tray Needle(s):  Type: Epidural  Gauge (G): 14 Length: Regular (10cm)  Qty: 2  H&P (Pre-op Assessment):  Terri Woods is a 53 y.o. (year old), female patient, seen today for interventional treatment. She  has a past surgical history that includes Shoulder surgery; Small intestine surgery; Breast surgery; Foot surgery (Left); Breast excisional biopsy (Right, 15+ yrs ago); Nasal septoplasty w/ turbinoplasty (Bilateral, 12/25/2018); Lymph node biopsy; Bilateral carpal tunnel release (Bilateral, 02/05/2019); Colonoscopy with propofol  (N/A, 03/10/2020); and Cataract extraction w/PHACO (Right, 10/30/2021).  Initial Vital Signs:  Pulse/EKG Rate: 90ECG Heart Rate: 91 (NSR) Temp: 97.9 F (36.6 C) Resp: 18 BP: 131/78 SpO2: 100 %  BMI: Estimated body mass index is 30.99 kg/m as calculated from the following:   Height as of this encounter: 5' 1 (1.549 m).   Weight as of this encounter: 164 lb (74.4 kg).  Risk Assessment: Allergies: Reviewed. She is allergic to exenatide, levodopa, lyrica [pregabalin], other, rotigotine, erythromycin, bee venom, fentanyl , hysingla er [hydrocodone bitartrate er], meloxicam , doxycycline, morphine, penicillins, semaglutide, and shellfish allergy.  Allergy Precautions: None required Coagulopathies: Reviewed. None identified.  Blood-thinner therapy: None at this time Active Infection(s): Reviewed. None identified. Terri Woods is afebrile  Site Confirmation: Terri Woods was asked to confirm the procedure and laterality before marking the site, which she did. Procedure checklist: Completed Consent: Before the procedure and under the influence of no sedative(s),  amnesic(s), or anxiolytics, the patient was informed  of the treatment options, risks and possible complications. To fulfill our ethical and legal obligations, as recommended by the American Medical Association's Code of Ethics, I have informed the patient of my clinical impression; the nature and purpose of the treatment or procedure; the risks, benefits, and possible complications of the intervention; the alternatives, including doing nothing; the risk(s) and benefit(s) of the alternative treatment(s) or procedure(s); and the risk(s) and benefit(s) of doing nothing.  Terri Woods was provided with information about the general risks and possible complications associated with most interventional procedures. These include, but are not limited to: failure to achieve desired goals, infection, bleeding, organ or nerve damage, allergic reactions, paralysis, and/or death.  In addition, she was informed of those risks and possible complications associated to this particular procedure, which include, but are not limited to: damage to the implant; failure to decrease pain; local, systemic, or serious CNS infections, intraspinal abscess with possible cord compression and paralysis, or life-threatening such as meningitis; intrathecal and/or epidural bleeding with formation of hematoma with possible spinal cord compression and permanent paralysis; organ damage; nerve injury or damage with subsequent sensory, motor, and/or autonomic system dysfunction, resulting in transient or permanent pain, numbness, and/or weakness of one or several areas of the body; allergic reactions, either minor or major life-threatening, such as anaphylactic or anaphylactoid reactions.  Furthermore, Terri Woods was informed of those risks and complications associated with the medications. These include, but are not limited to: allergic reactions (i.e.: anaphylactic or anaphylactoid reactions); arrhythmia;  Hypotension/hypertension;  cardiovascular collapse; respiratory depression and/or shortness of breath; swelling or edema; medication-induced neural toxicity; particulate matter embolism and blood vessel occlusion with resultant organ, and/or nervous system infarction and permanent paralysis.  Finally, she was informed that Medicine is not an exact science; therefore, there is also the possibility of unforeseen or unpredictable risks and/or possible complications that may result in a catastrophic outcome. The patient indicated having understood very clearly. We have given the patient no guarantees and we have made no promises. Enough time was given to the patient to ask questions, all of which were answered to the patient's satisfaction. Ms. Mcnulty has indicated that she wanted to continue with the procedure. Attestation: I, the ordering provider, attest that I have discussed with the patient the benefits, risks, side-effects, alternatives, likelihood of achieving goals, and potential problems during recovery for the procedure that I have provided informed consent. Date  Time: 05/22/2023  8:34 AM  Pre-Procedure Preparation:  Monitoring: As per clinic protocol. Respiration, ETCO2, SpO2, BP, heart rate and rhythm monitor placed and checked for adequate function Safety Precautions: Patient was assessed for positional comfort and pressure points before starting the procedure. Time-out: I initiated and conducted the Time-out before starting the procedure, as per protocol. The patient was asked to participate by confirming the accuracy of the Time Out information. Verification of the correct person, site, and procedure were performed and confirmed by me, the nursing staff, and the patient. Time-out conducted as per Joint Commission's Universal Protocol (UP.01.01.01). Time: 0950 Start Time: 0950 hrs.  Description/Narrative of Procedure:          Rationale (medical necessity): procedure needed and proper for the diagnosis and/or  treatment of the patient's medical symptoms and needs. Procedural Technique Safety Precautions: Aspiration looking for blood return was conducted prior to all injections. At no point did we inject any substances, as a needle was being advanced. No attempts were made at seeking any paresthesias. Safe injection practices and needle disposal techniques used.  Medications properly checked for expiration dates. SDV (single dose vial) medications used. Description of the Procedure: Protocol guidelines were followed. The patient was assisted into a comfortable position. The target area was identified and the area prepped in the usual manner. Skin & deeper tissues infiltrated with local anesthetic. Appropriate amount of time allowed to pass for local anesthetics to take effect. The procedure needles were then advanced to the target area. Proper needle placement secured. Negative aspiration confirmed. Solution injected in intermittent fashion, asking for systemic symptoms every 0.5cc of injectate. The needles were then removed and the area cleansed, making sure to leave some of the prepping solution back to take advantage of its long term bactericidal properties.  Technical description of procedure: Availability of a responsible, adult driver, and NPO status confirmed. Informed consent was obtained after having discussed risks and possible complications. An IV was started. The patient was then taken to the fluoroscopy suite, where the patient was placed in position for the procedure, over the fluoroscopy table. The patient was then monitored in the usual manner. Fluoroscopy was manipulated to obtain the best possible view of the target. Parallex error was corrected before commencing the procedure. Once a clear view of the target had been obtained, the skin and deeper tissues over the procedure site were infiltrated using lidocaine , loaded in a 10 cc luer-loc syringe with a 0.5 inch, 25-G needle. The introducer needle(s)  was/were then inserted through the skin and deeper tissues. A paramidline approach was used to enter the posterior epidural space at a 30 angle, using "Loss-of-resistance Technique" with 3 ml of PF-NaCl (0.9% NSS). Correct needle placement was confirmed in the antero-posterior and lateral fluoroscopic views. The lead was gently introduced and manipulated under real-time fluoroscopy, constantly assessing for pain, discomfort, or paresthesias, until the tip rested at the desired level. Both sides were done in identical fashion. Electrode placement was tested until appropriate coverage was attained. Once the patient confirmed that the stimulation was over the desired area, the lead(s) was/were secured in place and the introducer needles removed. This was done under real-time fluoroscopy while observing the electrode tip to avoid unintended migration. The area was covered with a non-occlusive dressing and the patient transported to recovery for further programming.  Vitals:   05/22/23 1021 05/22/23 1037 05/22/23 1047 05/22/23 1057  BP: (!) 138/90 125/70 127/66 105/65  Pulse:      Resp: 18 18 16 19   Temp:      SpO2: 100% 98% 98% 100%  Weight:      Height:        Start Time: 0950 hrs. End Time: 1021 hrs.  Neurostimulator Details:   Lead(s):  Brand: Medtronic         Epidural Access Level:  T12-L1 L1-2  Lead implant:  Bilateral   No. of Electrodes/Lead:  8 8  Laterality:  left Right  Top electrode location:  Bottom of 7 T8 (top)  Bottom electrode location:  T10 T10  Model No.: [id number]  Length: 60cm Same  Lot No.: [id number]   Imaging Guidance (Spinal):          Type of Imaging Technique: Fluoroscopy Guidance (Spinal) Indication(s): Fluoroscopy guidance for needle placement to enhance accuracy in procedures requiring precise needle localization for targeted delivery of medication in or near specific anatomical locations not easily accessible without such real-time imaging  assistance. Exposure Time: Please see nurses notes. Contrast: None used. Fluoroscopic Guidance: I was personally present during the use of fluoroscopy. Tunnel Vision  Technique used to obtain the best possible view of the target area. Parallax error corrected before commencing the procedure. Direction-depth-direction technique used to introduce the needle under continuous pulsed fluoroscopy. Once target was reached, antero-posterior, oblique, and lateral fluoroscopic projection used confirm needle placement in all planes. Images permanently stored in EMR. Interpretation: No contrast injected. I personally interpreted the imaging intraoperatively. Adequate needle placement confirmed in multiple planes. Permanent images saved into the patient's record.      Antibiotic Prophylaxis:   Anti-infectives (From admission, onward)    Start     Dose/Rate Route Frequency Ordered Stop   05/22/23 0915  ceFAZolin  (ANCEF ) IVPB 2g/100 mL premix        2 g 200 mL/hr over 30 Minutes Intravenous  Once 05/22/23 0909 05/22/23 0933   05/22/23 0000  cephALEXin  (KEFLEX ) 500 MG capsule        500 mg Oral 4 times daily 05/22/23 0828 05/29/23 2359      Indication(s): Implant Prophylaxis.  Post-operative Assessment:  Post-procedure Vital Signs:  Pulse/HCG Rate: 9089 Temp: 97.9 F (36.6 C) Resp: 19 BP: 105/65 SpO2: 100 %  Complications: No immediate post-treatment complications observed by team, or reported by patient.  Note: The patient tolerated the entire procedure well. A repeat set of vitals were taken after the procedure and the patient was kept under observation following institutional policy, for this type of procedure. Post-procedural neurological assessment was performed, showing return to baseline, prior to discharge. The patient was provided with post-procedure discharge instructions, including a section on how to identify potential problems. Should any problems arise concerning this procedure,  the patient was given instructions to immediately contact us , at any time, without hesitation. In any case, we plan to contact the patient by telephone for a follow-up status report regarding this interventional procedure.  Comments:  No additional relevant information.  Plan of Care  Orders:  Orders Placed This Encounter  Procedures   DG PAIN CLIN[facility] C-ARM 1-60 MIN NO REPORT    Intraoperative interpretation by procedural physician at [facility].    Standing Status:   Standing    Number of Occurrences:   1    Reason for exam::   Assistance in needle guidance and placement for procedures requiring needle placement in or near specific anatomical locations not easily accessible without such assistance.    Medications administered: We administered lidocaine , lactated ringers , midazolam , fentaNYL , ropivacaine  (PF) 2 mg/mL (0.2%), and ceFAZolin .  See the medical record for exact dosing, route, and time of administration.  Follow-up plan:   Return in about 1 week (around 05/29/2023) for SCS lead pull.      Recent Visits Date Type Provider Dept  03/19/23 Office Visit Marcelino Nurse, MD [facility]  Showing recent visits within past 90 days and meeting all other requirements Today's Visits Date Type Provider Dept  05/22/23 Procedure visit Marcelino Nurse, MD [facility]  Showing today's visits and meeting all other requirements Future Appointments Date Type Provider Dept  05/29/23 Appointment Marcelino Nurse, MD [facility]  Showing future appointments within next 90 days and meeting all other requirements  Disposition: Discharge home  Discharge (Date  Time): 05/22/2023; 1110 hrs.   Primary Care Physician: [facility] [place] Primary Location: [facility] Note by: Nurse Marcelino, MD (TTS technology used. I apologize for any typographical errors that were not detected and corrected.) Date: 05/22/2023; Time: 11:46 AM

## 2023-05-23 ENCOUNTER — Telehealth: Payer: Self-pay

## 2023-05-23 NOTE — Telephone Encounter (Signed)
 Post procedure follow up.  Patient states she is doing good so far today.

## 2023-05-28 ENCOUNTER — Encounter: Payer: Self-pay | Admitting: Student in an Organized Health Care Education/Training Program

## 2023-05-28 ENCOUNTER — Ambulatory Visit
Admission: RE | Admit: 2023-05-28 | Discharge: 2023-05-28 | Disposition: A | Discharge status: Home / Self Care | Payer: 59 | Source: Ambulatory Visit | Attending: Student in an Organized Health Care Education/Training Program | Admitting: Student in an Organized Health Care Education/Training Program

## 2023-05-28 ENCOUNTER — Ambulatory Visit
Payer: 59 | Attending: Student in an Organized Health Care Education/Training Program | Admitting: Student in an Organized Health Care Education/Training Program

## 2023-05-28 VITALS — BP 148/73 | HR 94 | Temp 97.0°F | Resp 16 | Ht 61.0 in | Wt 167.0 lb

## 2023-05-28 DIAGNOSIS — G8929 Other chronic pain: Secondary | ICD-10-CM

## 2023-05-28 DIAGNOSIS — M5416 Radiculopathy, lumbar region: Secondary | ICD-10-CM

## 2023-05-28 DIAGNOSIS — M51362 Other intervertebral disc degeneration, lumbar region with discogenic back pain and lower extremity pain: Secondary | ICD-10-CM

## 2023-05-28 DIAGNOSIS — M47816 Spondylosis without myelopathy or radiculopathy, lumbar region: Secondary | ICD-10-CM

## 2023-05-28 NOTE — Progress Notes (Signed)
 1515 SCS lead removal per Dr. Cherylann Ratel. Site clear and leads intact. Wound care instructions given.

## 2023-05-28 NOTE — Progress Notes (Signed)
 Yes please patient: Terri Woods  Service Category: Procedure  Provider: Wallie Sherry, MD  DOB: [birth year]  DOS: 05/28/2023  Referring Provider: [facility] [place] [facility]  MRN: [id number]  Setting: Ambulatory outpatient  PCP: [facility] [place] [facility]  Type: Established Patient  Specialty: Interventional Pain Management    Location: Office  Delivery: Face-to-face  SCS TRIAL POST-OP EVALUATION   Primary Reason(s) for Visit: Encounter for removal of temporary spinal cord stimulator lead(s) and evaluation of trial implant. CC: Back Pain (lower)  HPI  Terri Woods is a 53 y.o. year old, female patient, who comes today for a post-procedure evaluation. She has Chronic radicular lumbar pain; Lumbar spondylosis; Degeneration of intervertebral disc of lumbar region with discogenic back pain and lower extremity pain; Chronic pain syndrome; Head trauma; Crush injury; Loss of consciousness (HCC); Loss of vision; Suicidal ideation; GAD (generalized anxiety disorder); MDD (major depressive disorder); and Chronic prescription opiate use on their problem list. Her primarily concern today is the Back Pain (lower)  Pain Assessment: Location: Lower Back Radiating: both h ips and left leg Onset: More than a month ago Duration: Chronic pain Quality: Burning, Constant, Throbbing Severity: 8 /10 (subjective, self-reported pain score)  Effect on ADL:   Timing: Constant Modifying factors: medications BP: (!) 148/73  HR: 94  Terri Woods comes in today, after a SCS (Spinal Cord Stimulator) Trial Implant on 05/23/2023, to have her percutaneous, temporary neurostimulator lead(s) removed and to evaluate the trial experience to determine if a permanent implant may be effective in controlling some or all of her chronic pain symptoms.  Further details on both, my assessment(s), as well as the proposed treatment plan, please see below.  Patient inadvertently cut her spinal cord stimulator trial lead last night.  Thankfully, lead was  still visible and externalized.  I was notified about this from Adam with [employer].  Prior to this, she endorses significant pain relief with her spinal cord stimulator trial rating pain relief is approximately 75% and ability to perform ADLs more comfortably and in less pain.  Spinal cord stimulator trial leads were removed tips intact.  Referral to Dr. Katrina for permanent implant.  Post-operative Assessment  Intra-procedural problems/complications: None observed.         Reported side-effects: None.        Post-surgical adverse reactions or complications: None reported         Laboratory Chemistry Profile   Renal Lab Results  Component Value Date   BUN 35 (H) 03/25/2023   CREATININE 1.04 (H) 03/25/2023   GFRAA >60 01/28/2019   GFRNONAA >60 03/25/2023   PROTEINUR TRACE (A) 09/20/2022     Electrolytes Lab Results  Component Value Date   NA 134 (L) 03/25/2023   K 3.8 03/25/2023   CL 101 03/25/2023   CALCIUM  9.3 03/25/2023     Hepatic Lab Results  Component Value Date   AST 53 (H) 05/08/2022   ALT 62 (H) 05/08/2022   ALBUMIN 3.8 05/08/2022   ALKPHOS 85 05/08/2022   LIPASE 31 05/14/2020     ID Lab Results  Component Value Date   SARSCOV2NAA NEGATIVE 05/09/2023   PREGTESTUR NEGATIVE 03/10/2020     Bone Lab Results  Component Value Date   VD25OH 15.8 (L) 12/21/2015   VD125OH2TOT 40.5 12/21/2015     Endocrine Lab Results  Component Value Date   GLUCOSE 198 (H) 03/25/2023   GLUCOSEU NEGATIVE 09/20/2022     Neuropathy No results found for: VITAMINB12, FOLATE, HGBA1C, HIV   CNS No results  found for: COLORCSF, APPEARCSF, RBCCOUNTCSF, WBCCSF, POLYSCSF, LYMPHSCSF, EOSCSF, PROTEINCSF, GLUCCSF, JCVIRUS, CSFOLI, IGGCSF, LABACHR, ACETBL   Inflammation (CRP: Acute  ESR: Chronic) Lab Results  Component Value Date   CRP 0.8 07/18/2020   ESRSEDRATE 28 07/18/2020     Rheumatology No results found for: RF, ANA, LABURIC,  URICUR, LYMEIGGIGMAB, LYMEABIGMQN, HLAB27   Coagulation Lab Results  Component Value Date   INR 0.9 11/05/2011   LABPROT 12.5 11/05/2011   PLT 223 03/25/2023     Cardiovascular Lab Results  Component Value Date   CKTOTAL 49 03/27/2023   TROPONINI < 0.02 11/06/2011   HGB 12.1 03/25/2023   HCT 36.1 03/25/2023     Screening Lab Results  Component Value Date   SARSCOV2NAA NEGATIVE 05/09/2023   PREGTESTUR NEGATIVE 03/10/2020     Cancer No results found for: CEA, CA125, LABCA2   Allergens No results found for: ALMOND, APPLE, ASPARAGUS, AVOCADO, BANANA, BARLEY, BASIL, BAYLEAF, GREENBEAN, LIMABEAN, WHITEBEAN, BEEFIGE, REDBEET, BLUEBERRY, BROCCOLI, CABBAGE, MELON, CARROT, CASEIN, CASHEWNUT, CAULIFLOWER, CELERY     Note: Lab results reviewed.  Recent Imaging Results  No results found for this or any previous visit.  Interpretation Report: Fluoroscopy was used during the procedure to assist with needle guidance. The images were interpreted intraoperatively by the requesting physician.        Meds   Current Outpatient Medications:    albuterol  (VENTOLIN  HFA) 108 (90 Base) MCG/ACT inhaler, Inhale 1-2 puffs into the lungs every 4 (four) hours as needed for wheezing or shortness of breath., Disp: 1 each, Rfl: 0   atorvastatin  (LIPITOR) 40 MG tablet, Take 40 mg by mouth daily., Disp: , Rfl:    cephALEXin  (KEFLEX ) 500 MG capsule, Take 1 capsule (500 mg total) by mouth 4 (four) times daily for 7 days., Disp: 28 capsule, Rfl: 0   cyclobenzaprine  (FLEXERIL ) 10 MG tablet, Take by mouth 3 (three) times daily as needed for muscle spasms., Disp: , Rfl:    EPINEPHrine  (EPIPEN  2-PAK) 0.3 mg/0.3 mL IJ SOAJ injection, Inject 0.3 mg into the muscle as needed., Disp: 1 each, Rfl: 1   fluticasone  (FLONASE ) 50 MCG/ACT nasal spray, Place 2 sprays into both nostrils daily., Disp: 16 g, Rfl: 0   ibuprofen  (ADVIL ) 600 MG tablet, Take 1 tablet (600  mg total) by mouth every 8 (eight) hours as needed., Disp: 30 tablet, Rfl: 0   LANTUS  100 UNIT/ML injection, Inject 50 Units into the skin at bedtime., Disp: , Rfl:    naloxone  (NARCAN ) nasal spray 4 mg/0.1 mL, Place 1 spray into the nose as needed (accidental overdose)., Disp: , Rfl:    omeprazole (PRILOSEC) 20 MG capsule, Take 20 mg by mouth daily., Disp: , Rfl:    oseltamivir  (TAMIFLU ) 75 MG capsule, Take 1 capsule (75 mg total) by mouth 2 (two) times daily. X 5 days, Disp: 10 capsule, Rfl: 0   oxyCODONE -acetaminophen  (PERCOCET/ROXICET) 5-325 MG tablet, Take 1 tablet by mouth every 6 (six) hours as needed., Disp: , Rfl:    OXYCONTIN  10 MG 12 hr tablet, Take 10 mg by mouth every 8 (eight) hours., Disp: , Rfl:    promethazine -dextromethorphan (PROMETHAZINE -DM) 6.25-15 MG/5ML syrup, Take 5 mLs by mouth 4 (four) times daily as needed for cough., Disp: 118 mL, Rfl: 0   SUMAtriptan (IMITREX) 100 MG tablet, Take 100 mg by mouth every 2 (two) hours as needed for migraine. May repeat in 2 hours if headache persists or recurs., Disp: , Rfl:    amLODipine  (NORVASC ) 5 MG tablet, Take  1 tablet (5 mg total) by mouth daily., Disp: 30 tablet, Rfl: 0   clonazePAM  (KLONOPIN ) 0.5 MG tablet, Take 1 tablet (0.5 mg total) by mouth 2 (two) times daily as needed for up to 15 days (anxiety)., Disp: 30 tablet, Rfl: 0   DULoxetine  (CYMBALTA ) 30 MG capsule, Take 2 capsules (60 mg total) by mouth at bedtime AND 1 capsule (30 mg total) at bedtime., Disp: 90 capsule, Rfl: 0   glipiZIDE  (GLUCOTROL ) 10 MG tablet, Take 1 tablet (10 mg total) by mouth 2 (two) times daily with a meal., Disp: 60 tablet, Rfl: 0   lisinopril  (ZESTRIL ) 20 MG tablet, Take 1 tablet (20 mg total) by mouth daily., Disp: 30 tablet, Rfl: 0   QUEtiapine  (SEROQUEL ) 300 MG tablet, Take 1 tablet (300 mg total) by mouth at bedtime., Disp: 30 tablet, Rfl: 0  ROS  Constitutional: Denies any fever or chills Gastrointestinal: No reported hemesis, hematochezia,  vomiting, or acute GI distress Musculoskeletal: Denies any acute onset joint swelling, redness, loss of ROM, or weakness Neurological: No reported episodes of acute onset apraxia, aphasia, dysarthria, agnosia, amnesia, paralysis, loss of coordination, or loss of consciousness  Allergies  Ms. Spindle is allergic to exenatide, levodopa, lyrica [pregabalin], other, rotigotine, erythromycin, bee venom, fentanyl , hysingla er [hydrocodone bitartrate er], meloxicam , doxycycline, morphine, penicillins, semaglutide, and shellfish allergy.  PFSH  Drug: Ms. Oshields  reports current drug use. Drug: Oxycodone . Alcohol:  reports no history of alcohol use. Tobacco:  reports that she has never smoked. She has never used smokeless tobacco. Medical:  has a past medical history of Arthritis, Asthma, Back pain, Breast cancer (HCC) (2001), Carpal tunnel syndrome, Crohn's disease (HCC), Diabetes (HCC), Fibromyalgia, GERD (gastroesophageal reflux disease), Glaucoma, Headache, and Hypertension. Surgical: Ms. Scarpulla  has a past surgical history that includes Shoulder surgery; Small intestine surgery; Breast surgery; Foot surgery (Left); Breast excisional biopsy (Right, 15+ yrs ago); Nasal septoplasty w/ turbinoplasty (Bilateral, 12/25/2018); Lymph node biopsy; Bilateral carpal tunnel release (Bilateral, 02/05/2019); Colonoscopy with propofol  (N/A, 03/10/2020); and Cataract extraction w/PHACO (Right, 10/30/2021). Family: family history includes Breast cancer in her maternal aunt and mother; Heart attack (age of onset: 62) in her sister; Heart disease in her father.  Postop Exam  General appearance: Afebrile. Well nourished, well developed, and well hydrated. In no apparent acute distress. Vitals:   05/28/23 1501  BP: (!) 148/73  Pulse: 94  Resp: 16  Temp: (!) 97 F (36.1 C)  TempSrc: Temporal  SpO2: 97%  Weight: 167 lb (75.8 kg)  Height: 5' 1 (1.549 m)   BMI Assessment: Estimated body mass index is 31.55 kg/m  as calculated from the following:   Height as of this encounter: 5' 1 (1.549 m).   Weight as of this encounter: 167 lb (75.8 kg).  Surgical site: Wound is healing well. No redness, tenderness, discharge, abnormal odors, or any other evidence of infection or complications.  Assessment  Primary Diagnosis & Pertinent Problem List: The primary encounter diagnosis was Chronic radicular lumbar pain. Diagnoses of Lumbar facet arthropathy, Lumbar spondylosis, and Degeneration of intervertebral disc of lumbar region with discogenic back pain and lower extremity pain were also pertinent to this visit.  Diagnosis  1. Chronic radicular lumbar pain   2. Lumbar facet arthropathy   3. Lumbar spondylosis   4. Degeneration of intervertebral disc of lumbar region with discogenic back pain and lower extremity pain      Plan of Care  Referral for permament implant  Orders:  Orders Placed This Encounter  Procedures   DG PAIN CLINIC C-ARM 1-60 MIN NO REPORT    Intraoperative interpretation by procedural physician at [facility].    Standing Status:   Standing    Number of Occurrences:   1    Reason for exam::   Assistance in needle guidance and placement for procedures requiring needle placement in or near specific anatomical locations not easily accessible without such assistance.   Ambulatory referral to Neurosurgery    Referral Priority:   Routine    Referral Type:   Surgical    Referral Reason:   Specialty Services Required    Referred to Provider:   Clois Fret, MD    Requested Specialty:   Neurosurgery    Number of Visits Requested:   1    Medications administered: Cheresa Siers had no medications administered during this visit.  See the medical record for exact dosing, route, and time of administration.  Follow-up plan:   No follow-ups on file.       Recent Visits Date Type Provider Dept  05/22/23 Procedure visit Marcelino Nurse, MD [facility]  03/19/23  Office Visit Marcelino Nurse, MD [facility]  Showing recent visits within past 90 days and meeting all other requirements Today's Visits Date Type Provider Dept  05/28/23 Procedure visit Marcelino Nurse, MD [facility]  Showing today's visits and meeting all other requirements Future Appointments No visits were found meeting these conditions. Showing future appointments within next 90 days and meeting all other requirements  Disposition: Discharge home  Discharge (Date  Time): 05/28/2023;   hrs.   Primary Care Physician: [facility] [place] [facility] Location: [facility] Note by: Nurse Marcelino, MD (TTS technology used. I apologize for any typographical errors that were not detected and corrected.) Date: 05/28/2023; Time: 3:17 PM

## 2023-05-29 ENCOUNTER — Ambulatory Visit: Payer: 59 | Admitting: Student in an Organized Health Care Education/Training Program

## 2023-05-30 ENCOUNTER — Other Ambulatory Visit: Payer: Self-pay

## 2023-05-30 ENCOUNTER — Ambulatory Visit (INDEPENDENT_AMBULATORY_CARE_PROVIDER_SITE_OTHER): Payer: 59 | Admitting: Neurosurgery

## 2023-05-30 VITALS — BP 130/82 | Ht 61.0 in | Wt 167.0 lb

## 2023-05-30 DIAGNOSIS — Z01818 Encounter for other preprocedural examination: Secondary | ICD-10-CM

## 2023-05-30 DIAGNOSIS — M5416 Radiculopathy, lumbar region: Secondary | ICD-10-CM | POA: Diagnosis not present

## 2023-05-30 DIAGNOSIS — G894 Chronic pain syndrome: Secondary | ICD-10-CM | POA: Diagnosis not present

## 2023-05-30 DIAGNOSIS — E1142 Type 2 diabetes mellitus with diabetic polyneuropathy: Secondary | ICD-10-CM

## 2023-05-30 DIAGNOSIS — M549 Dorsalgia, unspecified: Secondary | ICD-10-CM | POA: Diagnosis not present

## 2023-05-30 DIAGNOSIS — G8929 Other chronic pain: Secondary | ICD-10-CM

## 2023-05-30 NOTE — Addendum Note (Signed)
Addended by: Sharlot Gowda on: 05/30/2023 11:27 AM   Modules accepted: Orders

## 2023-05-30 NOTE — Patient Instructions (Signed)
  Please see below for information in regards to your upcoming surgery:   Planned surgery: thoracic laminectomy for spinal cord stimulator placement ([employer])    Surgery date: 06/26/23 at [facility] (Medical Mall: [address], [place], [place] [address]) - you will find out your arrival time the business day before your surgery.   Pre-op appointment at [facility] Pre-admit Testing: we will call you with a date/time for this. If you are scheduled for an in person appointment, Pre-admit Testing is located on the first floor of the [facility], [address], [address]. Please bring all prescriptions in the original prescription bottles to your appointment. During this appointment, they will advise you which medications you can take the morning of surgery, and which medications you will need to hold for surgery. Labs (such as blood work, EKG) may be done at your pre-op appointment. You are not required to fast for these labs. Should you need to change your pre-op appointment, please call Pre-admit testing at [phone].     Blood thinners:   Aspirin 81mg :   if taking as a preventative, stop aspirin 7 days prior, resume aspirin 14 days after      Surgical clearance: we will send a clearance form to Etheleen Nicks, FNP. They may wish to see you in their office prior to signing the clearance form. If so, they may call you to schedule an appointment.      Common restrictions after surgery: No bending, lifting, or twisting ("BLT"). Avoid lifting objects heavier than 10 pounds for the first 6 weeks after surgery. Where possible, avoid household activities that involve lifting, bending, reaching, pushing, or pulling such as laundry, vacuuming, grocery shopping, and childcare. Try to arrange for help from friends and family for these activities while you heal. Do not drive while taking prescription pain medication. Weeks 6 through 12 after  surgery: avoid lifting more than 25 pounds.     How to contact us:  If you have any questions/concerns before or after surgery, you can reach [place] at [phone], or you can send a mychart message. We can be reached by phone or mychart 8am-4pm, Monday-Friday.  *Please note: Calls after 4pm are forwarded to a third party answering service. Mychart messages are not routinely monitored during evenings, weekends, and holidays. Please call our office to contact the answering service for urgent concerns during non-business hours.      If you have FMLA/disability paperwork, please drop it off or fax it to [phone], attention Patty.   Appointments/FMLA & disability paperwork: Joycelyn Rua, & Flonnie Hailstone Registered Nurse/Surgery scheduler: Royston Cowper Medical Assistants: Nash Mantis Physician Assistants: Joan Flores, PA-C, Manning Charity, PA-C & Drake Leach, PA-C Surgeons: Venetia Night, MD & Ernestine Mcmurray, MD

## 2023-05-30 NOTE — Progress Notes (Signed)
Referring Physician:  Edward Jolly, MD [address] [place],  [place] [address]  Primary Physician:  [facility]  History of Present Illness: 05/30/2023 Terri Woods returns to see me.  She had a successful [employer] stimulator trial for diabetic neuropathy.   12/11/2022 Ms. Terri Woods is here today with a chief complaint of low back pain that radiates into the and bilateral hips and legs.   She has been having pain for many years in multiple locations.  She gets shooting pains down her legs.  She has severe back pain that is impacted by walking, standing, bending, and lifting.  Nothing really helps.  Her pain is very severe.  She is in the pain clinic and has been undergoing multimodal pain management for some time.  Nothing has really helped her.   Bowel/Bladder Dysfunction: none  Conservative measures: seen a chiropractor, had massage  Physical therapy: has participated in Home Health PT about 6 months ago.  Multimodal medical therapy including regular antiinflammatories:  ibuprofen, cymbalta, gabapentin, lidocaine patch, oxycodone Injections:  has received epidural steroid injections Interlaminar epidural steroid injection at L5-S1 11/28/2021.  Left RFA to L3, L4, L5 in 2019  Bilateral L3-5 RFA in (right 08/11/15, left 08/19/15) with significant improvement Bilateral L3-L5 RFA (right 10/29/16, left 10/15/16) Left L3-5 RFA (02/03/18)with benefit C7/T1 ILESI (11/25/18) - beneficial C7/T1 ILESI (11/13/19)   Past Surgery: denies  Matthew Folks has no symptoms of cervical myelopathy.  The symptoms are causing a significant impact on the patient's life.   I have utilized the care everywhere function in epic to review the outside records available from external health systems.  Review of Systems:  A 10 point review of systems is negative, except for the pertinent positives and negatives detailed in the HPI.  Past Medical History: Past Medical History:   Diagnosis Date   Arthritis    neck, hips, fingers   Asthma    Back pain    Breast cancer (HCC) 2001   RT LUMPECTOMY PER PT   Carpal tunnel syndrome    right arm   Crohn's disease (HCC)    Diabetes (HCC)    Fibromyalgia    GERD (gastroesophageal reflux disease)    Glaucoma    Headache    Hypertension     Past Surgical History: Past Surgical History:  Procedure Laterality Date   BILATERAL CARPAL TUNNEL RELEASE Bilateral 02/05/2019   Procedure: BILATERAL CARPAL TUNNEL RELEASE;  Surgeon: Kennedy Bucker, MD;  Location: [facility];  Service: Orthopedics;  Laterality: Bilateral;   BREAST EXCISIONAL BIOPSY Right 15+ yrs ago   NEG   BREAST SURGERY     CATARACT EXTRACTION W/PHACO Right 10/30/2021   Procedure: CATARACT EXTRACTION PHACO AND INTRAOCULAR LENS PLACEMENT (IOC) RIGHT DIABETIC 1.85 00:20.9;  Surgeon: Nevada Crane, MD;  Location: [facility];  Service: Ophthalmology;  Laterality: Right;  Diabetic   COLONOSCOPY WITH PROPOFOL N/A 03/10/2020   Procedure: COLONOSCOPY WITH PROPOFOL;  Surgeon: Sung Amabile, DO;  Location: [facility];  Service: General;  Laterality: N/A;   FOOT SURGERY Left    LYMPH NODE BIOPSY     NASAL SEPTOPLASTY W/ TURBINOPLASTY Bilateral 12/25/2018   Procedure: NASAL SEPTOPLASTY WITH INFERIOR TURBINATE REDUCTION;  Surgeon: Vernie Murders, MD;  Location: [facility];  Service: ENT;  Laterality: Bilateral;   SHOULDER SURGERY     SMALL INTESTINE SURGERY      Allergies: Allergies as of 05/30/2023 - Review Complete 05/30/2023  Allergen Reaction Noted   Exenatide  Anaphylaxis 02/27/2023   Levodopa Hives 09/29/2019   Lyrica [pregabalin] Anaphylaxis and Hives 10/23/2021   Other Anaphylaxis 10/25/2021   Rotigotine Hives 01/27/2020   Erythromycin Rash and Other (See Comments) 09/20/2014   Bee venom Swelling 09/20/2014   Fentanyl Itching 10/23/2021   Hysingla er [hydrocodone bitartrate er] Itching 10/23/2021   Meloxicam Itching 10/23/2021    Doxycycline Rash 09/20/2014   Morphine Rash 10/23/2021   Penicillins Hives, Nausea And Vomiting, and Rash 01/06/2020   Semaglutide Hives and Swelling 01/10/2023   Shellfish allergy Rash 09/20/2014    Medications:  Current Outpatient Medications:    albuterol (VENTOLIN HFA) 108 (90 Base) MCG/ACT inhaler, Inhale 1-2 puffs into the lungs every 4 (four) hours as needed for wheezing or shortness of breath., Disp: 1 each, Rfl: 0   amLODipine (NORVASC) 5 MG tablet, Take 1 tablet (5 mg total) by mouth daily., Disp: 30 tablet, Rfl: 0   atorvastatin (LIPITOR) 40 MG tablet, Take 40 mg by mouth daily., Disp: , Rfl:    cyclobenzaprine (FLEXERIL) 10 MG tablet, Take by mouth 3 (three) times daily as needed for muscle spasms., Disp: , Rfl:    DULoxetine (CYMBALTA) 30 MG capsule, Take 2 capsules (60 mg total) by mouth at bedtime AND 1 capsule (30 mg total) at bedtime., Disp: 90 capsule, Rfl: 0   EPINEPHrine (EPIPEN 2-PAK) 0.3 mg/0.3 mL IJ SOAJ injection, Inject 0.3 mg into the muscle as needed., Disp: 1 each, Rfl: 1   fluticasone (FLONASE) 50 MCG/ACT nasal spray, Place 2 sprays into both nostrils daily., Disp: 16 g, Rfl: 0   glipiZIDE (GLUCOTROL) 10 MG tablet, Take 1 tablet (10 mg total) by mouth 2 (two) times daily with a meal., Disp: 60 tablet, Rfl: 0   LANTUS 100 UNIT/ML injection, Inject 50 Units into the skin at bedtime., Disp: , Rfl:    lisinopril (ZESTRIL) 20 MG tablet, Take 1 tablet (20 mg total) by mouth daily. (Patient taking differently: Take 20 mg by mouth in the morning and at bedtime.), Disp: 30 tablet, Rfl: 0   naloxone (NARCAN) nasal spray 4 mg/0.1 mL, Place 1 spray into the nose as needed (accidental overdose)., Disp: , Rfl:    omeprazole (PRILOSEC) 20 MG capsule, Take 20 mg by mouth daily., Disp: , Rfl:    oseltamivir (TAMIFLU) 75 MG capsule, Take 1 capsule (75 mg total) by mouth 2 (two) times daily. X 5 days, Disp: 10 capsule, Rfl: 0   oxyCODONE-acetaminophen (PERCOCET/ROXICET) 5-325 MG  tablet, Take 1 tablet by mouth every 6 (six) hours as needed., Disp: , Rfl:    OXYCONTIN 10 MG 12 hr tablet, Take 10 mg by mouth every 8 (eight) hours., Disp: , Rfl:    promethazine-dextromethorphan (PROMETHAZINE-DM) 6.25-15 MG/5ML syrup, Take 5 mLs by mouth 4 (four) times daily as needed for cough., Disp: 118 mL, Rfl: 0   QUEtiapine (SEROQUEL) 300 MG tablet, Take 1 tablet (300 mg total) by mouth at bedtime., Disp: 30 tablet, Rfl: 0   SUMAtriptan (IMITREX) 100 MG tablet, Take 100 mg by mouth every 2 (two) hours as needed for migraine. May repeat in 2 hours if headache persists or recurs., Disp: , Rfl:   Social History: Social History   Tobacco Use   Smoking status: Never   Smokeless tobacco: Never  Vaping Use   Vaping status: Never Used  Substance Use Topics   Alcohol use: No    Alcohol/week: 0.0 standard drinks of alcohol   Drug use: Yes    Types: Oxycodone  Family Medical History: Family History  Problem Relation Age of Onset   Heart attack Sister 40   Heart disease Father    Breast cancer Mother    Breast cancer Maternal Aunt     Physical Examination: Vitals:   05/30/23 1048  BP: 130/82    General: Patient is in moderate distress. Attention to examination is appropriate.  Neck:   Supple.  Full range of motion.  Respiratory: Patient is breathing without any difficulty.   NEUROLOGICAL:     Awake, alert, oriented to person, place, and time.  Speech is clear and fluent.   Cranial Nerves: Pupils equal round and reactive to light.  Facial tone is symmetric.  Facial sensation is symmetric. Shoulder shrug is symmetric. Tongue protrusion is midline.  There is no pronator drift.  Strength: Side Biceps Triceps Deltoid Interossei Grip Wrist Ext. Wrist Flex.  R 5 5 5 5 5 5 5   L 5 5 5 5 5 5 5    Side Iliopsoas Quads Hamstring PF DF EHL  R 5 5 5 5 5 5   L 5 5 5 5 5 5    Reflexes are 1+ and symmetric at the biceps, triceps, brachioradialis, patella and achilles.    Hoffman's is absent.   Bilateral upper and lower extremity sensation is intact to light touch.    No evidence of dysmetria noted.  Gait is antalgic.     Medical Decision Making  Imaging: MRI L spine 11/14/2022  IMPRESSION: 1. No acute findings or explanation for the patient's symptoms. 2. Mild disc bulging and facet hypertrophy at L3-4 and L4-5 without resulting spinal stenosis or nerve root encroachment. 3. Previously demonstrated left subarticular zone disc protrusion at L5-S1 has largely involuted.     Electronically Signed   By: Carey Bullocks M.D.   On: 11/23/2022 14:05 I have personally reviewed the images and agree with the above interpretation.  Assessment and Plan: Ms. Moos is a pleasant 53 y.o. female with chronic back and leg pain.  She has painful diabetic neuropathy.  She has chronic pain syndrome due to this.  She has had 60% relief with a spinal cord stimulator evaluation.  I have recommended permanent implantation.    I discussed the planned procedure at length with the patient, including the risks, benefits, alternatives, and indications. The risks discussed include but are not limited to bleeding, infection, need for reoperation, spinal fluid leak, stroke, vision loss, anesthetic complication, coma, paralysis, and even death. I also described in detail that improvement was not guaranteed.  The patient expressed understanding of these risks, and asked that we proceed with surgery. I described the surgery in layman's terms, and gave ample opportunity for questions, which were answered to the best of my ability.     Thank you for involving me in the care of this patient.      Gianelle Mccaul K. Myer Haff MD, [facility]

## 2023-06-06 ENCOUNTER — Telehealth: Payer: Self-pay

## 2023-06-06 NOTE — Telephone Encounter (Addendum)
Received surgery clearance form from pt's PCP Terri Nicks, FNP for thoracic laminectomy for spinal cord stimulator placement scheduled on 06/26/23 for chronic pain and diabetic neuropathy.  Please see response:  Is this patient optimized to have the above procedure? No  Risk: High  "05/30/23 Hgb A1c 9.9 concerns regarding potential infection, wound healing due to uncontrolled blood sugar."   *PAT appt is scheduled for 06/14/23*

## 2023-06-07 ENCOUNTER — Other Ambulatory Visit: Payer: Self-pay

## 2023-06-07 ENCOUNTER — Inpatient Hospital Stay
Admission: RE | Admit: 2023-06-07 | Discharge: 2023-06-07 | Disposition: A | Discharge status: Home / Self Care | Payer: Self-pay | Source: Ambulatory Visit | Attending: Neurosurgery | Admitting: Neurosurgery

## 2023-06-07 DIAGNOSIS — Z049 Encounter for examination and observation for unspecified reason: Secondary | ICD-10-CM

## 2023-06-13 NOTE — Telephone Encounter (Addendum)
Left another message for pt to return my call. On my voicemail, I stated for her not to go to her pre-admit testing appointment on 06/14/23 and to call me back ASAP to discuss her surgery with Dr Myer Haff. I also left the same message on her husband's voicemail.

## 2023-06-13 NOTE — Telephone Encounter (Signed)
Per further discussion with Dr Myer Haff, surgery will need to be canceled at this time. She will need to have an A1c of 7.5 or less to proceed with surgery, or she can have a fructosamine test in 6 weeks if her sugars are consistently 180 or less.  I left a message for the pt to return my call.

## 2023-06-13 NOTE — Telephone Encounter (Signed)
I spoke with Mr and Mrs Chandran and I explained that she is not cleared for surgery at this time due to her elevated A1c. I explained the rationale behind this. I encouraged her to reach out to her PCP for assistance with getting her blood sugars under better control.   She will need an A1c of 7.5 or less. Alternatively, she can have a fructosamine test in 6 weeks if her blood sugars are consistently less than 180.  Once her A1c is 7.5 or less, or she has an acceptable fructosamine test, we can reschedule her surgery. I informed her that she can contact her PCP or [place] for the repeat A1c or fructosamine test.   She expressed disappointment, but understanding of the above information. I have also sent her a mychart message with a summary of our discussion. I will route a copy of this call to her PCP as well.

## 2023-06-14 ENCOUNTER — Inpatient Hospital Stay: Admission: RE | Admit: 2023-06-14 | Payer: 59 | Source: Ambulatory Visit

## 2023-06-26 ENCOUNTER — Ambulatory Visit: Admit: 2023-06-26 | Payer: 59 | Admitting: Neurosurgery

## 2023-06-26 SURGERY — THORACIC LAMINECTOMY FOR SPINAL CORD STIMULATOR
Anesthesia: General

## 2023-07-11 ENCOUNTER — Encounter: Payer: 59 | Admitting: Neurosurgery

## 2023-08-08 ENCOUNTER — Encounter: Payer: 59 | Admitting: Neurosurgery

## 2023-08-19 ENCOUNTER — Ambulatory Visit
Admission: EM | Admit: 2023-08-19 | Discharge: 2023-08-19 | Attending: Physician Assistant | Admitting: Physician Assistant

## 2023-08-19 DIAGNOSIS — I161 Hypertensive emergency: Secondary | ICD-10-CM

## 2023-08-19 DIAGNOSIS — R079 Chest pain, unspecified: Secondary | ICD-10-CM

## 2023-08-19 DIAGNOSIS — R519 Headache, unspecified: Secondary | ICD-10-CM

## 2023-08-19 NOTE — ED Provider Notes (Signed)
 [facility]    CSN: [id number] Arrival date & time: 08/19/23  1843      History   Chief Complaint Chief Complaint  Patient presents with   Chest Pain   Dizziness   Nausea   Headache    HPI Terri Woods is a 53 y.o. female with history of Crohn's disease, fibromyalgia, chronic pain, diabetes, asthma, migraines, hypertension, GERD, carpal tunnel syndrome, breast cancer, asthma, polyarthritis.  Presents with her husband today for evaluation of severe 10 out of 10 headache that has been ongoing all day.  Headache is of bilateral temples.  It is associated dizziness, fatigue, lightheadedness, blurred vision, photophobia and nausea without vomiting.  History of migraines with says the current headache is different than headaches she has had in the past.  Patient says over the past couple of hours she started to experience left-sided chest pain which radiates to her back and is associated with shortness of breath and palpitations.  Patient is prescribed oxycodone 10 mg TID for chronic pain.  She has taken 800 mg ibuprofen at home as well as her oxycodone and says it has not made a difference for her pain.  HPI  Past Medical History:  Diagnosis Date   Arthritis    neck, hips, fingers   Asthma    Back pain    Breast cancer (HCC) 2001   RT LUMPECTOMY PER PT   Carpal tunnel syndrome    right arm   Crohn's disease (HCC)    Diabetes (HCC)    Fibromyalgia    GERD (gastroesophageal reflux disease)    Glaucoma    Headache    Hypertension     Patient Active Problem List   Diagnosis Date Noted   Chronic prescription opiate use 03/27/2023   Suicidal ideation 03/26/2023   GAD (generalized anxiety disorder) 03/26/2023   MDD (major depressive disorder) 03/26/2023   Head trauma 03/24/2023   Crush injury 03/24/2023   Loss of consciousness (HCC) 03/24/2023   Loss of vision 03/24/2023   Chronic radicular lumbar pain 03/19/2023   Lumbar spondylosis 03/19/2023    Degeneration of intervertebral disc of lumbar region with discogenic back pain and lower extremity pain 03/19/2023   Chronic pain syndrome 03/19/2023    Past Surgical History:  Procedure Laterality Date   BILATERAL CARPAL TUNNEL RELEASE Bilateral 02/05/2019   Procedure: BILATERAL CARPAL TUNNEL RELEASE;  Surgeon: Kennedy Bucker, MD;  Location: [facility] OR[facility];  Service: Orthopedics;  Laterality: Bilateral;   BREAST EXCISIONAL BIOPSY Right 15+ yrs ago   NEG   BREAST SURGERY     CATARACT EXTRACTION W/PHACO Right 10/30/2021   Procedure: CATARACT EXTRACTION PHACO AND INTRAOCULAR LENS PLACEMENT (IOC) RIGHT DIABETIC 1.85 00:20.9;  Surgeon: Nevada Crane, MD;  Location: [facility];  Service: Ophthalmology;  Laterality: Right;  Diabetic   COLONOSCOPY WITH PROPOFOL N/A 03/10/2020   Procedure: COLONOSCOPY WITH PROPOFOL;  Surgeon: Sung Amabile, DO;  Location: [facility];  Service: General;  Laterality: N/A;   FOOT SURGERY Left    LYMPH NODE BIOPSY     NASAL SEPTOPLASTY W/ TURBINOPLASTY Bilateral 12/25/2018   Procedure: NASAL SEPTOPLASTY WITH INFERIOR TURBINATE REDUCTION;  Surgeon: Vernie Murders, MD;  Location: [facility];  Service: ENT;  Laterality: Bilateral;   SHOULDER SURGERY     SMALL INTESTINE SURGERY      OB History   No obstetric history on file.      Home Medications    Prior to Admission medications   Medication Sig Start Date  End Date Taking? Authorizing Provider  atorvastatin (LIPITOR) 40 MG tablet Take 40 mg by mouth daily. 01/29/23  Yes [provider]  DULoxetine (CYMBALTA) 60 MG capsule Take 60 mg by mouth 2 (two) times daily.   Yes [provider]  glipiZIDE (GLUCOTROL) 10 MG tablet Take 10 mg by mouth 2 (two) times daily before a meal. 06/05/23  Yes [provider]  LANTUS 100 UNIT/ML injection Inject 50 Units into the skin at bedtime. 04/19/23 04/18/24 Yes [provider]  lisinopril (ZESTRIL) 20 MG tablet Take 20 mg by mouth  in the morning and at bedtime.   Yes [provider]  OXYCONTIN 10 MG 12 hr tablet Take 10 mg by mouth every 8 (eight) hours.   Yes [provider]  QUEtiapine (SEROQUEL) 300 MG tablet Take 300 mg by mouth at bedtime.   Yes [provider]  albuterol (VENTOLIN HFA) 108 (90 Base) MCG/ACT inhaler Inhale 1-2 puffs into the lungs every 4 (four) hours as needed for wheezing or shortness of breath. 05/09/23   Domenick Gong, MD  aspirin EC 81 MG tablet Take 81 mg by mouth daily. Swallow whole.    [provider]  bimatoprost (LUMIGAN) 0.01 % SOLN Place 1 drop into both eyes at bedtime.    [provider]  brimonidine (ALPHAGAN P) 0.1 % SOLN Place 1 drop into both eyes in the morning and at bedtime.    [provider]  clonazePAM (KLONOPIN) 2 MG tablet Take 2 mg by mouth 2 (two) times daily. 05/31/23   [provider]  cyclobenzaprine (FLEXERIL) 10 MG tablet Take 10 mg by mouth 3 (three) times daily as needed for muscle spasms. 04/02/23   [provider]  diclofenac Sodium (VOLTAREN) 1 % GEL Apply 1 Application topically 3 (three) times daily as needed (back pain).    [provider]  docusate sodium (COLACE) 100 MG capsule Take 100 mg by mouth 2 (two) times daily as needed for mild constipation or moderate constipation.    [provider]  EPINEPHrine (EPIPEN 2-PAK) 0.3 mg/0.3 mL IJ SOAJ injection Inject 0.3 mg into the muscle as needed. 02/09/23   Phineas Semen, MD  fluticasone [facility]) 50 MCG/ACT nasal spray Place 2 sprays into both nostrils daily. Patient taking differently: Place 2 sprays into both nostrils daily as needed for allergies. 05/09/23   Domenick Gong, MD  ibuprofen (ADVIL) 800 MG tablet Take 800 mg by mouth every 8 (eight) hours as needed for moderate pain (pain score 4-6).    [provider]  ketoconazole (NIZORAL) 2 % shampoo Apply 1 Application topically 2 (two) times a week.     [provider]  lidocaine (LIDODERM) 5 % Place 1 patch onto the skin daily as needed (pain). Remove & Discard patch within 12 hours or as directed by MD    [provider]  lidocaine (XYLOCAINE) 5 % ointment Apply 1 Application topically 2 (two) times daily as needed for moderate pain (pain score 4-6).    [provider]  naloxone [facility]) nasal spray 4 mg/0.1 mL Place 1 spray into the nose as needed (accidental overdose).    [provider]  omeprazole (PRILOSEC) 20 MG capsule Take 20 mg by mouth daily.    [provider]  oxybutynin (DITROPAN) 5 MG tablet Take 5 mg by mouth daily. 06/03/23   [provider]  oxyCODONE-acetaminophen (PERCOCET/ROXICET) 5-325 MG tablet Take 1 tablet by mouth every 6 (six) hours as needed for  severe pain (pain score 7-10).    [provider]  promethazine-dextromethorphan (PROMETHAZINE-DM) 6.25-15 MG/5ML syrup Take 5 mLs by mouth 4 (four) times daily as needed for cough. Patient not taking: Reported on 06/07/2023 05/09/23   Domenick Gong, MD  QUEtiapine (SEROQUEL) 50 MG tablet Take 50 mg by mouth at bedtime.    [provider]  SUMAtriptan (IMITREX) 100 MG tablet Take 100 mg by mouth every 2 (two) hours as needed for migraine. May repeat in 2 hours if headache persists or recurs.    [provider]  topiramate (TOPAMAX) 100 MG tablet Take 100 mg by mouth 2 (two) times daily.  01/06/20  [provider]    Family History Family History  Problem Relation Age of Onset   Heart attack Sister 69   Heart disease Father    Breast cancer Mother    Breast cancer Maternal Aunt     Social History Social History   Tobacco Use   Smoking status: Never   Smokeless tobacco: Never  Vaping Use   Vaping status: Never Used  Substance Use Topics   Alcohol use: No    Alcohol/week: 0.0 standard drinks of alcohol   Drug use: Yes    Types: Oxycodone     Allergies   Exenatide,  Levodopa, Lyrica [pregabalin], Other, Rotigotine, Erythromycin, Bee venom, Fentanyl, Hysingla er [hydrocodone bitartrate er], Meloxicam, Doxycycline, Morphine, Penicillins, Semaglutide, and Shellfish allergy   Review of Systems Review of Systems  Constitutional:  Positive for fatigue. Negative for fever.  HENT:  Negative for congestion.   Eyes:  Positive for photophobia and visual disturbance.  Respiratory:  Positive for shortness of breath. Negative for cough and wheezing.   Cardiovascular:  Positive for chest pain and palpitations. Negative for leg swelling.  Gastrointestinal:  Positive for nausea. Negative for abdominal pain.  Musculoskeletal:  Positive for back pain.  Neurological:  Positive for dizziness, weakness, light-headedness and headaches. Negative for syncope and numbness.     Physical Exam Triage Vital Signs ED Triage Vitals  Encounter Vitals Group     BP 08/19/23 1852 (!) 177/91     Systolic BP Percentile --      Diastolic BP Percentile --      Pulse Rate 08/19/23 1852 71     Resp 08/19/23 1852 19     Temp 08/19/23 1852 98.3 F (36.8 C)     Temp Source 08/19/23 1852 Oral     SpO2 08/19/23 1852 97 %     Weight --      Height --      Head Circumference --      Peak Flow --      Pain Score 08/19/23 1850 10     Pain Loc --      Pain Education --      Exclude from Growth Chart --    No data found.  Updated Vital Signs BP (!) 152/93 (BP Location: Right Arm)   Pulse 71   Temp 98.3 F (36.8 C) (Oral)   Resp 19   SpO2 97%      Physical Exam Vitals and nursing note reviewed.  Constitutional:      General: She is not in acute distress.    Appearance: Normal appearance. She is not ill-appearing or toxic-appearing.  HENT:     Head: Normocephalic and atraumatic.     Nose: Nose normal.     Mouth/Throat:     Mouth: Mucous membranes are moist.     Pharynx:  Oropharynx is clear.  Eyes:     General: No scleral icterus.       Right eye: No discharge.         Left eye: No discharge.     Extraocular Movements: Extraocular movements intact.     Conjunctiva/sclera: Conjunctivae normal.     Pupils: Pupils are equal, round, and reactive to light.     Comments: Positive photophobia both eyes  Cardiovascular:     Rate and Rhythm: Normal rate and regular rhythm.     Heart sounds: Normal heart sounds.  Pulmonary:     Effort: Pulmonary effort is normal. No respiratory distress.     Breath sounds: Normal breath sounds.  Musculoskeletal:     Cervical back: Neck supple.  Skin:    General: Skin is dry.  Neurological:     General: No focal deficit present.     Mental Status: She is alert and oriented to person, place, and time. Mental status is at baseline.     Cranial Nerves: No cranial nerve deficit.     Motor: No weakness.     Gait: Gait normal.     Comments: 5-5 strength right upper and right lower extremity and 4 out of 4 strength left upper and lower extremity.  Patient says this is nothing new when she has had weakness on the left side for a while and is unsure as to why.  Psychiatric:        Mood and Affect: Mood normal.        Behavior: Behavior normal.      UC Treatments / Results  Labs (all labs ordered are listed, but only abnormal results are displayed) Labs Reviewed - No data to display  EKG   Radiology No results found.  Procedures ED EKG  Date/Time: 08/19/2023 7:10 PM  Performed by: Shirlee Latch, PA-C Authorized by: Shirlee Latch, PA-C   Interpretation:    Interpretation: abnormal   Rate:    ECG rate:  75 Rhythm:    Rhythm: sinus rhythm   Ectopy:    Ectopy: PVCs     PVCs:  Infrequent QRS:    QRS axis:  Normal   QRS intervals:  Normal   QRS conduction: normal   ST segments:    ST segments:  Normal T waves:    T waves: normal   Comments:     Sinus rhythm with occasional PVCs.  (including critical care time)  Medications Ordered in UC Medications - No data to display  Initial Impression / Assessment  and Plan / UC Course  I have reviewed the triage vital signs and the nursing notes.  Pertinent labs & imaging results that were available during my care of the patient were reviewed by me and considered in my medical decision making (see chart for details).   53 year old female with history of Crohn's disease, fibromyalgia, chronic pain, diabetes, asthma, migraines, arthritis, hypertension presents for severe headache with photophobia, dizziness, nausea without vomiting, fatigue, weakness "all day."  Headache did not respond to oxycodone or 800 mg ibuprofen.  Also reports left-sided chest pain, back pain, palpitations and shortness of breath x 2 to 3 hours.  The chest pain is not as bad as a headache.  Headache is not consistent migraine she has had in the past and appears to be worse.  Blood pressure elevated 177/91.  She appears uncomfortable and in pain.  Holds her head and closes her eyes frequently.  Normal cranial exam.  Chest  clear.  Heart regular rhythm.  5-5 strength bilateral upper and lower extremities and 4-4 strength on the left side.  Patient reports this is not new and has been open she has history of left-sided weakness did not cause.  BP re-check is 152/93. Advised to continue home meds.  EKG shows normal sinus rhythm and a rate with occasional PVCs.  Advised emergency department for further evaluation of severe headache, hypertensive urgency and chest pain.  Patient declines at this time and says she wants to go home and rest. Cites long ER wait times as a reason to not want to go and declines EMS transport. We discussed the importance of going for these serious complaints.  Advised if she goes home and has a serious condition she may become sicker an it could be fatal.  Patient reports that she will go to the ER if symptoms or not getting better or if they worsen.  Understands the risks of going home instead of the ED and signed an AMA form. Husband is with her and driving her home.  He says he will monitor her closely.   Final Clinical Impressions(s) / UC Diagnoses   Final diagnoses:  Severe headache  Hypertensive emergency  Chest pain, unspecified type     Discharge Instructions      -I advised to go to the ER for further workup of the severe headache and chest pain.  You have already taken a high-dose ibuprofen and oxycodone at home without relief and state your symptoms are not consistent with migraines you had in the past.  Your EKG is a little abnormal and your blood pressure significantly elevated.  It would be safest and best for you to go to the ER.  Please do not delay.  You have been advised to follow up immediately in the emergency department for concerning signs.symptoms. If you declined EMS transport, please have a family member take you directly to the ED at this time. Do not delay. Based on concerns about condition, if you do not follow up in th e ED, you may risk poor outcomes including worsening of condition, delayed treatment and potentially life threatening issues. If you have declined to go to the ED at this time, you should call your PCP immediately to set up a follow up appointment.  Go to ED for red flag symptoms, including; fevers you cannot reduce with Tylenol/Motrin, severe headaches, vision changes, numbness/weakness in part of the body, lethargy, confusion, intractable vomiting, severe dehydration, chest pain, breathing difficulty, severe persistent abdominal or pelvic pain, signs of severe infection (increased redness, swelling of an area), feeling faint or passing out, dizziness, etc. You should especially go to the ED for sudden acute worsening of condition if you do not elect to go at this time.      ED Prescriptions   None    PDMP not reviewed this encounter.   Shirlee Latch, PA-C 08/19/23 1942

## 2023-08-19 NOTE — ED Notes (Signed)
 Patient is being discharged from the Urgent Care and sent to the Emergency Department via personal vehicle . Per Athena Masse PA, patient is in need of higher level of care due to chest pain, dizziness, hypertension. Patient is aware and verbalizes understanding of plan of care.  Vitals:   08/19/23 1852  BP: (!) 177/91  Pulse: 71  Resp: 19  Temp: 98.3 F (36.8 C)  SpO2: 97%

## 2023-08-19 NOTE — ED Triage Notes (Addendum)
 Symptoms started today  Chest pain- left side that radiates to her back.  Dizziness Nausea Blurred vision Headache SOB

## 2023-08-19 NOTE — Discharge Instructions (Addendum)
-  I advised to go to the ER for further workup of the severe headache and chest pain.  You have already taken a high-dose ibuprofen and oxycodone at home without relief and state your symptoms are not consistent with migraines you had in the past.  Your EKG is a little abnormal and your blood pressure significantly elevated.  It would be safest and best for you to go to the ER.  Please do not delay.  You have been advised to follow up immediately in the emergency department for concerning signs.symptoms. If you declined EMS transport, please have a family member take you directly to the ED at this time. Do not delay. Based on concerns about condition, if you do not follow up in th e ED, you may risk poor outcomes including worsening of condition, delayed treatment and potentially life threatening issues. If you have declined to go to the ED at this time, you should call your PCP immediately to set up a follow up appointment.  Go to ED for red flag symptoms, including; fevers you cannot reduce with Tylenol/Motrin, severe headaches, vision changes, numbness/weakness in part of the body, lethargy, confusion, intractable vomiting, severe dehydration, chest pain, breathing difficulty, severe persistent abdominal or pelvic pain, signs of severe infection (increased redness, swelling of an area), feeling faint or passing out, dizziness, etc. You should especially go to the ED for sudden acute worsening of condition if you do not elect to go at this time.

## 2023-08-29 ENCOUNTER — Telehealth: Payer: Self-pay | Admitting: Neurosurgery

## 2023-08-29 NOTE — Telephone Encounter (Signed)
 Lauren or I will call her with surgery dates when we return to office on Monday.

## 2023-08-29 NOTE — Telephone Encounter (Signed)
 Patient called to let our office know that she had her A1C checked this morning at [facility] in [address] and her A1C is at 7.3. Please advise

## 2023-08-29 NOTE — Telephone Encounter (Signed)
 Per Dr. Mont Antis, Patient can be rescheduled at this time.

## 2023-09-02 NOTE — Telephone Encounter (Signed)
 Please see below for information in regards to your upcoming surgery:  Planned surgery: thoracic laminectomy for spinal cord stimulator placement ([employer])    Surgery date: 09/11/2023 at [facility] (Medical Mall: [address] [place], [place] [address]) - you will find out your arrival time the business day before your surgery.   Pre-op appointment at [facility] Pre-admit Testing: you will receive a call with a date/time for this appointment. If you are scheduled for an in person appointment, Pre-admit Testing is located on the first floor of the [facility] building, [address], [address]. During this appointment, they will advise you which medications you can take the morning of surgery, and which medications you will need to hold for surgery. Labs (such as blood work, EKG) may be done at your pre-op appointment. You are not required to fast for these labs. Should you need to change your pre-op appointment, please call Pre-admit testing at [phone].    Diabetes/weight loss medications: Per anesthesia guidelines (due to the increased risk of aspiration caused by delayed gastric emptying):  GLP-1 medications:  Injectables should be held x 7 days Liraglutide (Victoza, Saxenda) : Hold 7 days before surgery.  Surgical clearance: we will send a clearance form to Dudley Ghee, FNP. They may wish to see you in their office prior to signing the clearance form. If so, they may call you to schedule an appointment.  Common restrictions after surgery: No bending, lifting, or twisting ("BLT"). Avoid lifting objects heavier than 10 pounds for the first 6 weeks after surgery. Where possible, avoid household activities that involve lifting, bending, reaching, pushing, or pulling such as laundry, vacuuming, grocery shopping, and childcare.Try to arrange for help from friends and family for these activities while you heal. Do not drive while taking  prescription pain medication. Weeks 6 through 12 after surgery: avoid lifting more than 25 pounds.   How to contact us :  If you have any questions/concerns before or after surgery, you can reach us  at [phone], or you can send a mychart message. We can be reached by phone or mychart 8am-4pm, Monday-Friday.  *Please note: Calls after 4pm are forwarded to a third party answering service. Mychart messages are not routinely monitored during evenings, weekends, and holidays. Please call our office to contact the answering service for urgent concerns during non-business hours.  If you have FMLA/disability paperwork, please drop it off or fax it to [phone], attention Patty.   Appointments/FMLA & disability paperwork: Gerlean Kocher, & Maryann Smalls Registered Nurses/Surgery schedulers: Kendelyn & Miki Blank Medical Assistants: Donnajean Fuse Physician Assistants: Ludwig Safer, PA-C, Anastacio Karvonen, PA-C & Lucetta Russel, PA-C Surgeons: Jodeen Munch, MD & Henderson Lock, MD   [facility] PREADMIT TESTING VISIT and SURGERY INFORMATION SHEET   Now that surgery has been scheduled you can anticipate several phone calls from [facility] services. A pharmacy technician will call you to verify your current list of medications taken at home.               The Pre-Service Center will call to verify your insurance information and to give you billing estimates and information.             The Preadmit Testing Office will be calling to schedule a visit to obtain information for the anesthesia team and provide instructions on preparation for surgery.  What can you expect for the Preadmit Testing Visit: Appointments may be scheduled in-person or by telephone.  If a  telephone visit is scheduled, you may be asked to come into the office to have lab tests or other studies performed.   This visit will not be completed any greater than 14 days prior to your surgery.  If your surgery has been  scheduled for a future date, please do not be alarmed if we have not contacted you to schedule an appointment more than a month prior to the surgery date.    Please be prepared to provide the following information during this appointment:            -Personal medical history                                               -Medication and allergy list            -Any history of problems with anesthesia              -Recent lab work or diagnostic studies            -Please notify us  of any needs we should be aware of to provide the best care possible           -You will be provided with instructions on how to prepare for your surgery.   On The Day of Surgery:  You must have a driver to take you home after surgery, you will be asked not to drive for 24 hours following surgery.  Taxi, [employer] and non-medical transport will not be acceptable means of transportation unless you have a responsible individual who will be traveling with you.  Visitors in the surgical area:   2 people will be able to visit you in your room once your preparation for surgery has been completed. During surgery, your visitors will be asked to wait in the Surgery Waiting Area.  It is not a requirement for them to stay, if they prefer to leave and come back.  Your visitor(s) will be given an update once the surgery has been completed.  No visitors are allowed in the initial recovery room to respect patient privacy and safety.  Once you are more awake and transfer to the secondary recovery area, or are transferred to an inpatient room, visitors will again be able to see you.  To respect and protect your privacy: We will ask on the day of surgery who your driver will be and what the contact number for that individual will be. We will ask if it is okay to share information with this individual, or if there is an alternative individual that we, or the surgeon, should contact to provide updates and information. If family or friends come  to the surgical information desk requesting information about you, who you have not listed with us , no information will be given.   It may be helpful to designate someone as the main contact who will be responsible for updating your other friends and family.    PREADMIT TESTING OFFICE: [phone] SAME DAY SURGERY: [phone] We look forward to caring for you before and throughout the process of your surgery.

## 2023-09-03 ENCOUNTER — Other Ambulatory Visit: Payer: Self-pay

## 2023-09-03 DIAGNOSIS — Z01818 Encounter for other preprocedural examination: Secondary | ICD-10-CM

## 2023-09-04 ENCOUNTER — Other Ambulatory Visit

## 2023-09-05 ENCOUNTER — Encounter
Admission: RE | Admit: 2023-09-05 | Discharge: 2023-09-05 | Disposition: A | Discharge status: Home / Self Care | Source: Ambulatory Visit | Attending: Neurosurgery | Admitting: Neurosurgery

## 2023-09-05 ENCOUNTER — Encounter: Payer: Self-pay | Admitting: Urgent Care

## 2023-09-05 DIAGNOSIS — M5416 Radiculopathy, lumbar region: Secondary | ICD-10-CM | POA: Diagnosis not present

## 2023-09-05 DIAGNOSIS — Z01812 Encounter for preprocedural laboratory examination: Secondary | ICD-10-CM | POA: Insufficient documentation

## 2023-09-05 DIAGNOSIS — G8929 Other chronic pain: Secondary | ICD-10-CM | POA: Insufficient documentation

## 2023-09-05 LAB — URINALYSIS, COMPLETE (UACMP) WITH MICROSCOPIC
Bilirubin Urine: NEGATIVE
Glucose, UA: 50 mg/dL — AB
Hgb urine dipstick: NEGATIVE
Ketones, ur: NEGATIVE mg/dL
Nitrite: NEGATIVE
Protein, ur: NEGATIVE mg/dL
Specific Gravity, Urine: 1.017 (ref 1.005–1.030)
WBC, UA: >50 WBC/hpf (ref 0–5)
pH: 6 (ref 5.0–8.0)

## 2023-09-05 LAB — SURGICAL PCR SCREEN
MRSA, PCR: NEGATIVE
Staphylococcus aureus: NEGATIVE

## 2023-09-05 LAB — TYPE AND SCREEN
ABO/RH(D): O POS
Antibody Screen: NEGATIVE

## 2023-09-06 ENCOUNTER — Other Ambulatory Visit: Payer: Self-pay

## 2023-09-06 ENCOUNTER — Inpatient Hospital Stay: Admission: RE | Admit: 2023-09-06 | Source: Ambulatory Visit

## 2023-09-06 ENCOUNTER — Encounter
Admission: RE | Admit: 2023-09-06 | Discharge: 2023-09-06 | Disposition: A | Discharge status: Home / Self Care | Source: Ambulatory Visit | Attending: Neurosurgery | Admitting: Neurosurgery

## 2023-09-06 DIAGNOSIS — Z01812 Encounter for preprocedural laboratory examination: Secondary | ICD-10-CM

## 2023-09-06 DIAGNOSIS — E119 Type 2 diabetes mellitus without complications: Secondary | ICD-10-CM

## 2023-09-06 HISTORY — DX: Depression, unspecified: F32.A

## 2023-09-06 HISTORY — DX: Anxiety disorder, unspecified: F41.9

## 2023-09-06 HISTORY — DX: Cerebral infarction, unspecified: I63.9

## 2023-09-06 HISTORY — DX: Personal history of urinary calculi: Z87.442

## 2023-09-06 LAB — URINE CULTURE

## 2023-09-06 NOTE — Patient Instructions (Addendum)
 Your procedure is scheduled on: 09/11/23 - Wednesday Report to the Registration Desk on the 1st floor of the [facility]. To find out your arrival time, please call [phone] between 1PM - 3PM on: 09/10/23 -  09/10/23 - Tuesday If your arrival time is 6:00 am, do not arrive before that time as the [facility] entrance doors do not open until 6:00 am.  REMEMBER: Instructions that are not followed completely may result in serious medical risk, up to and including death; or upon the discretion of your surgeon and anesthesiologist your surgery may need to be rescheduled.  Do not eat food after midnight the night before surgery.  No gum chewing or hard candies.  You may however, drink CLEAR liquids up to 2 hours before you are scheduled to arrive for your surgery. Do not drink anything within 2 hours of your scheduled arrival time.  Clear liquids include: - water   You may continue to take if needed,  Anti-inflammatories (NSAIDS) such as Advil , Aleve, Ibuprofen , Motrin , Naproxen, Naprosyn and Aspirin  based products such as Excedrin, Goody's Powder, BC Powder.  Stop ANY OVER THE COUNTER supplements until after surgery.  You take Tylenol  if needed for pain up until the day of surgery.  LANTUS  - inject only 1/2 on the night before your surgery  liraglutide (VICTOZA) - hold 7 days prior to your surgery  ON THE DAY OF SURGERY ONLY TAKE THESE MEDICATIONS WITH SIPS OF WATER:  atorvastatin  (LIPITOR)  2.   brimonidine  (ALPHAGAN )  3.   clonazePAM  (KLONOPIN )  4.   DULoxetine  (CYMBALTA )  5.   omeprazole (PRILOSEC)  6.   OXYCONTIN    Use inhaler  albuterol  (VENTOLIN  HFA) on the day of surgery and bring to the hospital.    No Alcohol for 24 hours before or after surgery.  No Smoking including e-cigarettes for 24 hours before surgery.  No chewable tobacco products for at least 6 hours before surgery.  No nicotine patches on the day of surgery.  Do not use any "recreational" drugs for  at least a week (preferably 2 weeks) before your surgery.  Please be advised that the combination of cocaine and anesthesia may have negative outcomes, up to and including death. If you test positive for cocaine, your surgery will be cancelled.  On the morning of surgery brush your teeth with toothpaste and water, you may rinse your mouth with mouthwash if you wish. Do not swallow any toothpaste or mouthwash.  Use CHG Soap or wipes as directed on instruction sheet.  Do not wear jewelry, make-up, hairpins, clips or nail polish.  For welded (permanent) jewelry: bracelets, anklets, waist bands, etc.  Please have this removed prior to surgery.  If it is not removed, there is a chance that hospital personnel will need to cut it off on the day of surgery.  Do not wear lotions, powders, or perfumes.   Do not shave body hair from the neck down 48 hours before surgery.  Contact lenses, hearing aids and dentures may not be worn into surgery.  Do not bring valuables to the hospital. [facility] is not responsible for any missing/lost belongings or valuables.   Notify your doctor if there is any change in your medical condition (cold, fever, infection).  Wear comfortable clothing (specific to your surgery type) to the hospital.  After surgery, you can help prevent lung complications by doing breathing exercises.  Take deep breaths and cough every 1-2 hours. Your doctor may order a device called  an Facilities manager to help you take deep breaths. When coughing or sneezing, hold a pillow firmly against your incision with both hands. This is called "splinting." Doing this helps protect your incision. It also decreases belly discomfort.  If you are being admitted to the hospital overnight, leave your suitcase in the car. After surgery it may be brought to your room.  In case of increased patient census, it may be necessary for you, the patient, to continue your postoperative care in the Same Day  Surgery department.  If you are being discharged the day of surgery, you will not be allowed to drive home. You will need a responsible individual to drive you home and stay with you for 24 hours after surgery.   If you are taking public transportation, you will need to have a responsible individual with you.  Please call the Pre-admissions Testing Dept. at [phone] if you have any questions about these instructions.  Surgery Visitation Policy:  Patients having surgery or a procedure may have two visitors.  Children under the age of 63 must have an adult with them who is not the patient.  Inpatient Visitation:    Visiting hours are 7 a.m. to 8 p.m. Up to four visitors are allowed at one time in a patient room. The visitors may rotate out with other people during the day.  One visitor age 38 or older may stay with the patient overnight and must be in the room by 8 p.m.   Pre-operative 5 CHG Bath Instructions   You can play a key role in reducing the risk of infection after surgery. Your skin needs to be as free of germs as possible. You can reduce the number of germs on your skin by washing with CHG (chlorhexidine  gluconate) soap before surgery. CHG is an antiseptic soap that kills germs and continues to kill germs even after washing.   DO NOT use if you have an allergy to chlorhexidine /CHG or antibacterial soaps. If your skin becomes reddened or irritated, stop using the CHG and notify one of our RNs at [phone].   Please shower with the CHG soap starting 4 days before surgery using the following schedule:  04/26 - 04/30.                      Please keep in mind the following:  DO NOT shave, including legs and underarms, starting the day of your first shower.   You may shave your face at any point before/day of surgery.  Place clean sheets on your bed the day you start using CHG soap. Use a clean washcloth (not used since being washed) for each shower. DO NOT sleep with  pets once you start using the CHG.   CHG Shower Instructions:  If you choose to wash your hair and private area, wash first with your normal shampoo/soap.  After you use shampoo/soap, rinse your hair and body thoroughly to remove shampoo/soap residue.  Turn the water  OFF and apply about 3 tablespoons (45 ml) of CHG soap to a CLEAN washcloth.  Apply CHG soap ONLY FROM YOUR NECK DOWN TO YOUR TOES (washing for 3-5 minutes)  DO NOT use CHG soap on face, private areas, open wounds, or sores.  Pay special attention to the area where your surgery is being performed.  If you are having back surgery, having someone wash your back for you may be helpful. Wait 2 minutes after CHG soap is applied, then you may  rinse off the CHG soap.  Pat dry with a clean towel  Put on clean clothes/pajamas   If you choose to wear lotion, please use ONLY the CHG-compatible lotions on the back of this paper.     Additional instructions for the day of surgery: DO NOT APPLY any lotions, deodorants, cologne, or perfumes.   Put on clean/comfortable clothes.  Brush your teeth.  Ask your nurse before applying any prescription medications to the skin.      CHG Compatible Lotions   Aveeno Moisturizing lotion  Cetaphil Moisturizing Cream  Cetaphil Moisturizing Lotion  Clairol Herbal Essence Moisturizing Lotion, Dry Skin  Clairol Herbal Essence Moisturizing Lotion, Extra Dry Skin  Clairol Herbal Essence Moisturizing Lotion, Normal Skin  Curel Age Defying Therapeutic Moisturizing Lotion with Alpha Hydroxy  Curel Extreme Care Body Lotion  Curel Soothing Hands Moisturizing Hand Lotion  Curel Therapeutic Moisturizing Cream, Fragrance-Free  Curel Therapeutic Moisturizing Lotion, Fragrance-Free  Curel Therapeutic Moisturizing Lotion, Original Formula  Eucerin Daily Replenishing Lotion  Eucerin Dry Skin Therapy Plus Alpha Hydroxy Crme  Eucerin Dry Skin Therapy Plus Alpha Hydroxy Lotion  Eucerin Original Crme  Eucerin  Original Lotion  Eucerin Plus Crme Eucerin Plus Lotion  Eucerin TriLipid Replenishing Lotion  Keri Anti-Bacterial Hand Lotion  Keri Deep Conditioning Original Lotion Dry Skin Formula Softly Scented  Keri Deep Conditioning Original Lotion, Fragrance Free Sensitive Skin Formula  Keri Lotion Fast Absorbing Fragrance Free Sensitive Skin Formula  Keri Lotion Fast Absorbing Softly Scented Dry Skin Formula  Keri Original Lotion  Keri Skin Renewal Lotion Keri Silky Smooth Lotion  Keri Silky Smooth Sensitive Skin Lotion  Nivea Body Creamy Conditioning Oil  Nivea Body Extra Enriched Teacher, adult education Moisturizing Lotion Nivea Crme  Nivea Skin Firming Lotion  NutraDerm 30 Skin Lotion  NutraDerm Skin Lotion  NutraDerm Therapeutic Skin Cream  NutraDerm Therapeutic Skin Lotion  ProShield Protective Hand Cream  Provon moisturizing lotion

## 2023-09-06 NOTE — Pre-Procedure Instructions (Signed)
 she reports that CHG soap made her itch in the past, she is going to do a test area on her skin days before surgery and if it caused her to itch, she will go to just regular anti bacterial soap. I told her that if it caused her to itch that it needed to be added to her allergies.

## 2023-09-11 ENCOUNTER — Other Ambulatory Visit: Payer: Self-pay

## 2023-09-11 ENCOUNTER — Ambulatory Visit
Admission: RE | Admit: 2023-09-11 | Discharge: 2023-09-11 | Disposition: A | Discharge status: Home / Self Care | Source: Ambulatory Visit | Attending: Neurosurgery | Admitting: Neurosurgery

## 2023-09-11 ENCOUNTER — Encounter: Payer: Self-pay | Admitting: Neurosurgery

## 2023-09-11 ENCOUNTER — Ambulatory Visit: Payer: Self-pay | Admitting: Urgent Care

## 2023-09-11 ENCOUNTER — Ambulatory Visit

## 2023-09-11 ENCOUNTER — Encounter
Admission: RE | Disposition: A | Discharge status: Home / Self Care | Payer: Self-pay | Source: Ambulatory Visit | Attending: Neurosurgery

## 2023-09-11 DIAGNOSIS — E1165 Type 2 diabetes mellitus with hyperglycemia: Secondary | ICD-10-CM | POA: Insufficient documentation

## 2023-09-11 DIAGNOSIS — E1142 Type 2 diabetes mellitus with diabetic polyneuropathy: Secondary | ICD-10-CM

## 2023-09-11 DIAGNOSIS — Z7984 Long term (current) use of oral hypoglycemic drugs: Secondary | ICD-10-CM | POA: Insufficient documentation

## 2023-09-11 DIAGNOSIS — H543 Unqualified visual loss, both eyes: Secondary | ICD-10-CM | POA: Diagnosis not present

## 2023-09-11 DIAGNOSIS — R8290 Unspecified abnormal findings in urine: Secondary | ICD-10-CM

## 2023-09-11 DIAGNOSIS — I69998 Other sequelae following unspecified cerebrovascular disease: Secondary | ICD-10-CM | POA: Insufficient documentation

## 2023-09-11 DIAGNOSIS — R8271 Bacteriuria: Secondary | ICD-10-CM

## 2023-09-11 DIAGNOSIS — F32A Depression, unspecified: Secondary | ICD-10-CM | POA: Diagnosis not present

## 2023-09-11 DIAGNOSIS — Z794 Long term (current) use of insulin: Secondary | ICD-10-CM | POA: Insufficient documentation

## 2023-09-11 DIAGNOSIS — I10 Essential (primary) hypertension: Secondary | ICD-10-CM | POA: Insufficient documentation

## 2023-09-11 DIAGNOSIS — Z01812 Encounter for preprocedural laboratory examination: Secondary | ICD-10-CM

## 2023-09-11 DIAGNOSIS — M5441 Lumbago with sciatica, right side: Secondary | ICD-10-CM | POA: Insufficient documentation

## 2023-09-11 DIAGNOSIS — G894 Chronic pain syndrome: Secondary | ICD-10-CM | POA: Diagnosis present

## 2023-09-11 DIAGNOSIS — M5442 Lumbago with sciatica, left side: Secondary | ICD-10-CM | POA: Diagnosis not present

## 2023-09-11 DIAGNOSIS — M51362 Other intervertebral disc degeneration, lumbar region with discogenic back pain and lower extremity pain: Secondary | ICD-10-CM

## 2023-09-11 DIAGNOSIS — M797 Fibromyalgia: Secondary | ICD-10-CM | POA: Diagnosis not present

## 2023-09-11 DIAGNOSIS — E119 Type 2 diabetes mellitus without complications: Secondary | ICD-10-CM

## 2023-09-11 DIAGNOSIS — G8929 Other chronic pain: Secondary | ICD-10-CM

## 2023-09-11 DIAGNOSIS — Z01818 Encounter for other preprocedural examination: Secondary | ICD-10-CM

## 2023-09-11 HISTORY — PX: THORACIC LAMINECTOMY FOR SPINAL CORD STIMULATOR: SHX6887

## 2023-09-11 LAB — GLUCOSE, CAPILLARY
Glucose-Capillary: 295 mg/dL — ABNORMAL HIGH (ref 70–99)
Glucose-Capillary: 328 mg/dL — ABNORMAL HIGH (ref 70–99)
Glucose-Capillary: 343 mg/dL — ABNORMAL HIGH (ref 70–99)
Glucose-Capillary: 320 mg/dL — ABNORMAL HIGH (ref 70–99)
Glucose-Capillary: 182 mg/dL — ABNORMAL HIGH (ref 70–99)

## 2023-09-11 LAB — POCT I-STAT, CHEM 8
BUN: 33 mg/dL — ABNORMAL HIGH (ref 6–20)
Calcium, Ion: 1.06 mmol/L — ABNORMAL LOW (ref 1.15–1.40)
Chloride: 105 mmol/L (ref 98–111)
Creatinine, Ser: 1 mg/dL (ref 0.44–1.00)
Glucose, Bld: 303 mg/dL — ABNORMAL HIGH (ref 70–99)
HCT: 30 % — ABNORMAL LOW (ref 36.0–46.0)
Hemoglobin: 10.2 g/dL — ABNORMAL LOW (ref 12.0–15.0)
Potassium: 4.3 mmol/L (ref 3.5–5.1)
Sodium: 136 mmol/L (ref 135–145)
TCO2: 23 mmol/L (ref 22–32)

## 2023-09-11 LAB — POCT PREGNANCY, URINE: Preg Test, Ur: NEGATIVE

## 2023-09-11 LAB — ABO/RH: ABO/RH(D): O POS

## 2023-09-11 SURGERY — THORACIC LAMINECTOMY FOR SPINAL CORD STIMULATOR
Anesthesia: General | Site: Back

## 2023-09-11 MED ORDER — INSULIN ASPART 100 UNIT/ML IJ SOLN
INTRAMUSCULAR | Status: AC
  Filled 2023-09-11: qty 1

## 2023-09-11 MED ORDER — ACETAMINOPHEN 10 MG/ML IV SOLN: 1000.0000 mg | Freq: Once | INTRAVENOUS | Status: DC | PRN

## 2023-09-11 MED ORDER — KETAMINE HCL 50 MG/5ML IJ SOSY
PREFILLED_SYRINGE | INTRAMUSCULAR | Status: DC | PRN
  Administered 2023-09-11: 35 mg via INTRAVENOUS

## 2023-09-11 MED ORDER — PHENYLEPHRINE HCL-NACL 20-0.9 MG/250ML-% IV SOLN: 10.0000 ug/min | Freq: Once | INTRAVENOUS | Status: DC

## 2023-09-11 MED ORDER — SODIUM CHLORIDE 0.9 % IV SOLN: INTRAVENOUS | Status: DC | PRN

## 2023-09-11 MED ORDER — 0.9 % SODIUM CHLORIDE (POUR BTL) OPTIME
TOPICAL | Status: DC | PRN
  Administered 2023-09-11: 500 mL

## 2023-09-11 MED ORDER — CEFAZOLIN SODIUM-DEXTROSE 2-4 GM/100ML-% IV SOLN
INTRAVENOUS | Status: AC
  Filled 2023-09-11: qty 100

## 2023-09-11 MED ORDER — SUCCINYLCHOLINE CHLORIDE 200 MG/10ML IV SOSY
PREFILLED_SYRINGE | INTRAVENOUS | Status: DC | PRN
  Administered 2023-09-11: 80 mg via INTRAVENOUS

## 2023-09-11 MED ORDER — MIDAZOLAM HCL 2 MG/2ML IJ SOLN
INTRAMUSCULAR | Status: AC
  Filled 2023-09-11: qty 2

## 2023-09-11 MED ORDER — PHENYLEPHRINE 80 MCG/ML (10ML) SYRINGE FOR IV PUSH (FOR BLOOD PRESSURE SUPPORT)
PREFILLED_SYRINGE | INTRAVENOUS | Status: AC
  Filled 2023-09-11: qty 10

## 2023-09-11 MED ORDER — CEFAZOLIN IN SODIUM CHLORIDE 2-0.9 GM/100ML-% IV SOLN
2.0000 g | Freq: Once | INTRAVENOUS | Status: AC
  Administered 2023-09-11: 2 g via INTRAVENOUS

## 2023-09-11 MED ORDER — TRANEXAMIC ACID-NACL 1000-0.7 MG/100ML-% IV SOLN
INTRAVENOUS | Status: AC
  Filled 2023-09-11: qty 100

## 2023-09-11 MED ORDER — PROPOFOL 1000 MG/100ML IV EMUL
INTRAVENOUS | Status: AC
  Filled 2023-09-11: qty 100

## 2023-09-11 MED ORDER — OXYCODONE HCL 5 MG PO TABS
ORAL_TABLET | ORAL | Status: AC
  Filled 2023-09-11: qty 3

## 2023-09-11 MED ORDER — SODIUM CHLORIDE (PF) 0.9 % IJ SOLN
INTRAMUSCULAR | Status: AC
  Filled 2023-09-11: qty 20

## 2023-09-11 MED ORDER — LIDOCAINE HCL (PF) 2 % IJ SOLN
INTRAMUSCULAR | Status: AC
  Filled 2023-09-11: qty 5

## 2023-09-11 MED ORDER — DROPERIDOL 2.5 MG/ML IJ SOLN: 0.6250 mg | Freq: Once | INTRAMUSCULAR | Status: DC | PRN

## 2023-09-11 MED ORDER — BUPIVACAINE-EPINEPHRINE (PF) 0.5% -1:200000 IJ SOLN
INTRAMUSCULAR | Status: DC | PRN
  Administered 2023-09-11: 10 mL

## 2023-09-11 MED ORDER — VANCOMYCIN HCL IN DEXTROSE 1-5 GM/200ML-% IV SOLN
1000.0000 mg | Freq: Once | INTRAVENOUS | Status: AC
  Administered 2023-09-11: 1000 mg via INTRAVENOUS

## 2023-09-11 MED ORDER — ORAL CARE MOUTH RINSE: 15.0000 mL | Freq: Once | OROMUCOSAL | Status: AC

## 2023-09-11 MED ORDER — ACETAMINOPHEN 500 MG PO TABS
ORAL_TABLET | ORAL | Status: AC
  Filled 2023-09-11: qty 2

## 2023-09-11 MED ORDER — VASOPRESSIN 20 UNIT/ML IV SOLN
INTRAVENOUS | Status: DC | PRN
Start: 2023-09-11 — End: 2023-09-11
  Administered 2023-09-11 (×4): 2 [IU] via INTRAVENOUS

## 2023-09-11 MED ORDER — FENTANYL CITRATE (PF) 100 MCG/2ML IJ SOLN
INTRAMUSCULAR | Status: AC
  Filled 2023-09-11: qty 2

## 2023-09-11 MED ORDER — PHENYLEPHRINE 80 MCG/ML (10ML) SYRINGE FOR IV PUSH (FOR BLOOD PRESSURE SUPPORT)
PREFILLED_SYRINGE | INTRAVENOUS | Status: DC | PRN
  Administered 2023-09-11 (×3): 80 ug via INTRAVENOUS
  Administered 2023-09-11: 160 ug via INTRAVENOUS
  Administered 2023-09-11 (×2): 80 ug via INTRAVENOUS

## 2023-09-11 MED ORDER — VASOPRESSIN 20 UNIT/ML IV SOLN
INTRAVENOUS | Status: AC
  Filled 2023-09-11: qty 1

## 2023-09-11 MED ORDER — SURGIFLO WITH THROMBIN (HEMOSTATIC MATRIX KIT) OPTIME
TOPICAL | Status: DC | PRN
  Administered 2023-09-11: 1 via TOPICAL

## 2023-09-11 MED ORDER — DEXTROSE 50 % IV SOLN: 0.0000 mL | INTRAVENOUS | Status: DC | PRN

## 2023-09-11 MED ORDER — SODIUM CHLORIDE 0.9% FLUSH
3.0000 mL | Freq: Two times a day (BID) | INTRAVENOUS | Status: DC
Start: 2023-09-11 — End: 2023-09-11

## 2023-09-11 MED ORDER — INSULIN REGULAR(HUMAN) IN NACL 100-0.9 UT/100ML-% IV SOLN
INTRAVENOUS | Status: DC
  Administered 2023-09-11: 13 [IU]/h via INTRAVENOUS
  Filled 2023-09-11: qty 100

## 2023-09-11 MED ORDER — SODIUM CHLORIDE (PF) 0.9 % IJ SOLN
INTRAMUSCULAR | Status: DC | PRN
  Administered 2023-09-11: 60 mL via INTRAMUSCULAR

## 2023-09-11 MED ORDER — OXYCODONE HCL 5 MG PO TABS
15.0000 mg | ORAL_TABLET | Freq: Once | ORAL | Status: AC
  Administered 2023-09-11: 15 mg via ORAL

## 2023-09-11 MED ORDER — ACETAMINOPHEN 500 MG PO TABS
1000.0000 mg | ORAL_TABLET | Freq: Once | ORAL | Status: AC
  Administered 2023-09-11: 1000 mg via ORAL

## 2023-09-11 MED ORDER — LIDOCAINE HCL (CARDIAC) PF 100 MG/5ML IV SOSY
PREFILLED_SYRINGE | INTRAVENOUS | Status: DC | PRN
  Administered 2023-09-11: 100 mg via INTRAVENOUS

## 2023-09-11 MED ORDER — SENNA 8.6 MG PO TABS
1.0000 | ORAL_TABLET | Freq: Every day | ORAL | 0 refills | Status: AC | PRN
  Filled 2023-09-11: qty 30, 30d supply, fill #0

## 2023-09-11 MED ORDER — MIDAZOLAM HCL 2 MG/2ML IJ SOLN
INTRAMUSCULAR | Status: DC | PRN
Start: 2023-09-11 — End: 2023-09-11
  Administered 2023-09-11: 2 mg via INTRAVENOUS

## 2023-09-11 MED ORDER — ONDANSETRON HCL 4 MG/2ML IJ SOLN
INTRAMUSCULAR | Status: AC
  Filled 2023-09-11: qty 2

## 2023-09-11 MED ORDER — CHLORHEXIDINE GLUCONATE 0.12 % MT SOLN
15.0000 mL | Freq: Once | OROMUCOSAL | Status: AC
  Administered 2023-09-11: 15 mL via OROMUCOSAL

## 2023-09-11 MED ORDER — SODIUM CHLORIDE 0.9% FLUSH: 3.0000 mL | INTRAVENOUS | Status: DC | PRN

## 2023-09-11 MED ORDER — PROPOFOL 10 MG/ML IV BOLUS
INTRAVENOUS | Status: DC | PRN
  Administered 2023-09-11: 180 mg via INTRAVENOUS
  Administered 2023-09-11: 150 ug/kg/min via INTRAVENOUS

## 2023-09-11 MED ORDER — OXYCODONE HCL 5 MG PO TABS
5.0000 mg | ORAL_TABLET | Freq: Four times a day (QID) | ORAL | 0 refills | Status: DC | PRN
  Filled 2023-09-11: qty 30, 8d supply, fill #0

## 2023-09-11 MED ORDER — INSULIN ASPART 100 UNIT/ML IJ SOLN
12.0000 [IU] | Freq: Once | INTRAMUSCULAR | Status: AC
  Administered 2023-09-11: 12 [IU] via SUBCUTANEOUS

## 2023-09-11 MED ORDER — BUPIVACAINE HCL (PF) 0.5 % IJ SOLN
INTRAMUSCULAR | Status: AC
  Filled 2023-09-11: qty 30

## 2023-09-11 MED ORDER — BUPIVACAINE LIPOSOME 1.3 % IJ SUSP
INTRAMUSCULAR | Status: AC
  Filled 2023-09-11: qty 20

## 2023-09-11 MED ORDER — IRRISEPT - 450ML BOTTLE WITH 0.05% CHG IN STERILE WATER, USP 99.95% OPTIME
TOPICAL | Status: DC | PRN
  Administered 2023-09-11: 450 mL via TOPICAL

## 2023-09-11 MED ORDER — CYCLOBENZAPRINE HCL 10 MG PO TABS
10.0000 mg | ORAL_TABLET | Freq: Four times a day (QID) | ORAL | 0 refills | Status: DC | PRN
Start: 2023-09-11 — End: 2023-09-25
  Filled 2023-09-11: qty 120, 30d supply, fill #0

## 2023-09-11 MED ORDER — HYDROMORPHONE HCL 1 MG/ML IJ SOLN: 0.2500 mg | INTRAMUSCULAR | Status: DC | PRN

## 2023-09-11 MED ORDER — INSULIN ASPART 100 UNIT/ML IJ SOLN
10.0000 [IU] | Freq: Once | INTRAMUSCULAR | Status: AC
  Administered 2023-09-11: 10 [IU] via SUBCUTANEOUS

## 2023-09-11 MED ORDER — FENTANYL CITRATE (PF) 100 MCG/2ML IJ SOLN
INTRAMUSCULAR | Status: DC | PRN
  Administered 2023-09-11: 100 ug via INTRAVENOUS

## 2023-09-11 MED ORDER — PHENYLEPHRINE HCL-NACL 20-0.9 MG/250ML-% IV SOLN
INTRAVENOUS | Status: AC
  Filled 2023-09-11: qty 250

## 2023-09-11 MED ORDER — PHENYLEPHRINE HCL-NACL 20-0.9 MG/250ML-% IV SOLN
INTRAVENOUS | Status: DC | PRN
  Administered 2023-09-11: 20 ug/min via INTRAVENOUS

## 2023-09-11 MED ORDER — CHLORHEXIDINE GLUCONATE 0.12 % MT SOLN
OROMUCOSAL | Status: AC
  Filled 2023-09-11: qty 15

## 2023-09-11 MED ORDER — KETAMINE HCL 50 MG/5ML IJ SOSY
PREFILLED_SYRINGE | INTRAMUSCULAR | Status: AC
  Filled 2023-09-11: qty 5

## 2023-09-11 MED ORDER — ONDANSETRON HCL 4 MG/2ML IJ SOLN
INTRAMUSCULAR | Status: DC | PRN
  Administered 2023-09-11: 4 mg via INTRAVENOUS

## 2023-09-11 MED ORDER — VANCOMYCIN HCL IN DEXTROSE 1-5 GM/200ML-% IV SOLN
INTRAVENOUS | Status: AC
  Filled 2023-09-11: qty 200

## 2023-09-11 MED ORDER — SODIUM CHLORIDE FLUSH 0.9 % IV SOLN
INTRAVENOUS | Status: AC
  Filled 2023-09-11: qty 10

## 2023-09-11 SURGICAL SUPPLY — 40 items
BELT PT INTERSTIM MICRO SYSTEM (MISCELLANEOUS) IMPLANT
BUR NEURO DRILL SOFT 3.0X3.8M (BURR) ×1 IMPLANT
CONTROLLER HANDSET COMM KIT (NEUROSURGERY SUPPLIES) IMPLANT
DERMABOND ADVANCED .7 DNX12 (GAUZE/BANDAGES/DRESSINGS) ×2 IMPLANT
DRAPE C ARM PK CFD 31 SPINE (DRAPES) ×1 IMPLANT
DRAPE C-ARM XRAY 36X54 (DRAPES) IMPLANT
DRAPE LAPAROTOMY 100X77 ABD (DRAPES) ×1 IMPLANT
DRSG OPSITE POSTOP 4X6 (GAUZE/BANDAGES/DRESSINGS) IMPLANT
DRSG OPSITE POSTOP 4X8 (GAUZE/BANDAGES/DRESSINGS) IMPLANT
ELECTRODE REM PT RTRN 9FT ADLT (ELECTROSURGICAL) ×1 IMPLANT
FEE INTRAOP CADWELL SUPPLY NCS (MISCELLANEOUS) ×1 IMPLANT
FEE INTRAOP MONITOR IMPULS NCS (MISCELLANEOUS) ×1 IMPLANT
GLOVE BIOGEL PI IND STRL 6.5 (GLOVE) ×1 IMPLANT
GLOVE SURG SYN 6.5 ES PF (GLOVE) ×1 IMPLANT
GLOVE SURG SYN 6.5 PF PI (GLOVE) ×1 IMPLANT
GLOVE SURG SYN 8.5 E (GLOVE) ×3 IMPLANT
GLOVE SURG SYN 8.5 PF PI (GLOVE) ×3 IMPLANT
GOWN SRG LRG LVL 4 IMPRV REINF (GOWNS) ×1 IMPLANT
GOWN SRG XL LVL 3 NONREINFORCE (GOWNS) ×1 IMPLANT
GRAFT DURAGEN MATRIX 1WX1L (Tissue) IMPLANT
KIT SPINAL PRONEVIEW (KITS) ×1 IMPLANT
LAVAGE JET IRRISEPT WOUND (IRRIGATION / IRRIGATOR) ×1 IMPLANT
MANIFOLD NEPTUNE II (INSTRUMENTS) ×1 IMPLANT
MARKER SKIN DUAL TIP RULER LAB (MISCELLANEOUS) ×1 IMPLANT
NDL SAFETY ECLIPSE 18X1.5 (NEEDLE) ×1 IMPLANT
NEUROSTIM INCEPTIV (Neuro Prosthesis/Implant) IMPLANT
NS IRRIG 500ML POUR BTL (IV SOLUTION) ×1 IMPLANT
PACK LAMINECTOMY ARMC (PACKS) ×1 IMPLANT
PROGRAMMER AND COMM CASE (NEUROSURGERY SUPPLIES) IMPLANT
RECHARGER SYSTEM (NEUROSURGERY SUPPLIES) IMPLANT
STAPLER SKIN PROX 35W (STAPLE) ×1 IMPLANT
STIMULATOR CORD SURESCAN MRI (Stimulator) IMPLANT
SURGIFLO W/THROMBIN 8M KIT (HEMOSTASIS) ×1 IMPLANT
SUT SILK 2 0SH CR/8 30 (SUTURE) ×1 IMPLANT
SUT STRATA 3-0 15 PS-2 (SUTURE) ×2 IMPLANT
SUT VIC AB 0 CT1 18XCR BRD 8 (SUTURE) ×1 IMPLANT
SUT VIC AB 2-0 CT1 18 (SUTURE) ×1 IMPLANT
SYR 10ML LL (SYRINGE) IMPLANT
SYR 30ML LL (SYRINGE) ×2 IMPLANT
TRAP FLUID SMOKE EVACUATOR (MISCELLANEOUS) ×1 IMPLANT

## 2023-09-11 NOTE — Progress Notes (Signed)
 Glucose check-182. Per Dr Lincoln Renshaw stop insulin  infusion. B/p 105/68 with a MAP of 77. VSS. Okay to proceed to phase II for discharge.

## 2023-09-11 NOTE — Anesthesia Preprocedure Evaluation (Addendum)
 Anesthesia Evaluation  Patient identified by MRN, date of birth, ID band Patient awake    Reviewed: Allergy & Precautions, H&P , NPO status , Patient's Chart, lab work & pertinent test results  Airway Mallampati: II  TM Distance: >3 FB Neck ROM: full    Dental  (+) Missing   Pulmonary shortness of breath, asthma (mild intermittent)  CT Chest 07/2023: 1. No fibrotic interstitial lung disease.  2. Mild air trapping on expiratory imaging can be seen with small airways disease.  3. Patulous esophagus.  4. Enlarged main pulmonary artery which may be suggestive of pulmonary arterial hypertension. Small airways disease, patulous esophagus, and pulmonary hypertension can be seen in the setting of underlying autoimmune connective tissue diseases.     Pulmonary exam normal        Cardiovascular hypertension, Pt. on medications Normal cardiovascular exam  ECHo 07/15/2023: Summary   1. Technically difficult study.    2. The left ventricle is normal in size with mildly increased wall  thickness.   3. The left ventricular systolic function is normal, LVEF is visually  estimated at > 55%.    4. 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  Past Medical History: No date:  Anxiety No date: Arthritis     Comment:  neck, hips, fingers No date: Asthma No date: Back pain 2001: Breast cancer (HCC)     Comment:  RT LUMPECTOMY PER PT No date: Carpal tunnel syndrome     Comment:  right arm No date: Crohn's disease (HCC) No date: Depression No date: Diabetes (HCC) No date: Fibromyalgia No date: GERD (gastroesophageal reflux disease) No date: Glaucoma No date: Headache No date: History of kidney stones No date: Hypertension No date: Stroke [facility])     Comment:  eye  Past Surgical History: 02/05/2019: BILATERAL CARPAL TUNNEL RELEASE; Bilateral     Comment:  Procedure: BILATERAL CARPAL TUNNEL RELEASE;  Surgeon:               Molli Angelucci, MD;  Location: [facility] ORS;  Service:               Orthopedics;  Laterality: Bilateral; 15+ yrs ago: BREAST EXCISIONAL BIOPSY; Right     Comment:  NEG No date: BREAST SURGERY 10/30/2021: CATARACT EXTRACTION W/PHACO; Right     Comment:  Procedure: CATARACT EXTRACTION PHACO AND INTRAOCULAR               LENS PLACEMENT (IOC) RIGHT DIABETIC 1.85 00:20.9;                Surgeon: Rosa College, MD;  Location: [facility]               [facility]ERY [facility];  Service: Ophthalmology;  Laterality:  Right;  Diabetic 03/10/2020: COLONOSCOPY WITH PROPOFOL ; N/A     Comment:  Procedure: COLONOSCOPY WITH PROPOFOL ;  Surgeon: Conrado Delay, DO;  Location: [facility] ENDOSCOPY;  Service: General;               Laterality: N/A; No date: FOOT SURGERY; Left No date: LYMPH NODE BIOPSY 12/25/2018: NASAL SEPTOPLASTY W/ TURBINOPLASTY; Bilateral     Comment:  Procedure: NASAL SEPTOPLASTY WITH INFERIOR TURBINATE               REDUCTION;  Surgeon: Mellody Sprout, MD;  Location: [facility];  Service: ENT;  Laterality: Bilateral; No date: SHOULDER SURGERY No date: SMALL INTESTINE SURGERY  BMI    Body Mass Index: 31.16 kg/m      Reproductive/Obstetrics negative OB ROS                               Anesthesia Physical Anesthesia Plan  ASA: 3  Anesthesia Plan: General ETT   Post-op Pain Management: Ofirmev  IV (intra-op)*, Toradol  IV (intra-op)*, Dilaudid IV and Ketamine IV*   Induction: Intravenous  PONV Risk Score and Plan: 2 and Ondansetron , Dexamethasone  and Midazolam   Airway Management Planned: Oral ETT  Additional Equipment:   Intra-op Plan:   Post-operative Plan: Extubation in OR  Informed Consent: I have reviewed the patients History and Physical, chart, labs and discussed the procedure including the risks, benefits and alternatives for the proposed anesthesia with the patient or authorized representative who has indicated his/her understanding and acceptance.     Dental Advisory Given  Plan Discussed with: CRNA and Surgeon  Anesthesia Plan Comments:          Anesthesia Quick Evaluation

## 2023-09-11 NOTE — Progress Notes (Signed)
 Blood sugar 303 on lab results, Insulin  gtt increased to 18 per Dr Lincoln Renshaw and 12 SQ of novolog given

## 2023-09-11 NOTE — Op Note (Signed)
 Indications: the patient is a 53 yo female who was diagnosed with G89.4 Chronic pain syndrome, M54.42, M54.41, G89.29 Chronic bilateral low back pain with bilateral sciatica, E11.42 Diabetic polyneuropathy associated with type 2 diabetes mellitus . The patient had a successful trial for spinal cord stimulation, so was consented for placement of a permanent device   Findings: successful placement of a [employer] spinal cord stimulator.   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     Preoperative Note:    Risks of surgery discussed in clinic.   Operative Note:      The patient was then brought from the preoperative center with intravenous access established.  The patient underwent general anesthesia and endotracheal tube intubation, then was rotated on the [facility] table where all pressure points were appropriately padded.  An incision was marked with flouroscopy at T9/10, and on the left flank. The skin was then thoroughly cleansed.  Perioperative antibiotic prophylaxis was administered.  Sterile prep and drapes were then applied and a timeout was then observed.     Once this was complete an incision was opened with the use of a #10 blade knife in the midline at the thoracic incision.  The paraspinus muscled were subperiosteally dissected until the laminae of T9 and T10 were visualized. Flouroscopy was used to confirm the level. A self-retaining retractor was placed.   The rongeur was used to remove the spinous process of T9.  The drill was used to thin the bone until the ligamentum flavum was visualized.  The ligamentum was then removed and the dura  visualized. This was widened until placement of the paddle lead was possible.     The lead was then advanced to the T7/8 disc space at the top of the lead.  The lead was secured with a 2-0 silk suture.     The incision on the flank was then opened and a pocket formed until it was large enough for the pulse generator.  The tunneler was used to connect between the pocket and the incision.  The lead was inserted into the tunneler and tunneled to the flank.  The leads were attached to the IPG and impedances checked.  The leads were then tightened.  The IPG was then inserted into the pouch.   Both sites were irrigated.  The wounds were closed in layers with 0 and 2-0 vicryl.  The skin was approximated with monocryl. A sterile dressing was applied.   Monitoring was stable throughout.   Patient was then rotated back to the preoperative bed awakened from anesthesia and taken to recovery. All counts are correct in this case.   I performed the entire procedure with the assistance of Anastacio Karvonen PA as an Designer, television/film set. An assistant was required for this procedure due to the complexity.  The assistant provided assistance in tissue manipulation and suction, and was required for the successful and safe performance of the procedure. I performed the critical portions of the procedure.      Jodeen Munch MD

## 2023-09-11 NOTE — Progress Notes (Signed)
 Patient ambulated approximately 60 feet and back to the room with no difficulty.

## 2023-09-11 NOTE — Anesthesia Postprocedure Evaluation (Signed)
 Anesthesia Post Note  Patient: [name]  Procedure(s) Performed: THORACIC LAMINECTOMY FOR SPINAL CORD STIMULATOR (Back)  Patient location during evaluation: PACU Anesthesia Type: General Level of consciousness: awake and alert Pain management: pain level controlled Vital Signs Assessment: post-procedure vital signs reviewed and stable Respiratory status: spontaneous breathing, nonlabored ventilation and respiratory function stable Cardiovascular status: blood pressure returned to baseline and stable Postop Assessment: no apparent nausea or vomiting Anesthetic complications: no Comments: Hyperglycemia treated with IV insulin  with transition to subcutaneous novolog. Pt instructed to monitor and treat glucose at home.   No notable events documented.   Last Vitals:  Vitals:   09/11/23 1330 09/11/23 1345  BP: 114/69 (!) 117/93  Pulse: 89 89  Resp: 13 (!) 22  Temp:    SpO2: 97% 97%    Last Pain:  Vitals:   09/11/23 1345  TempSrc:   PainSc: 0-No pain                 Baltazar Bonier

## 2023-09-11 NOTE — Transfer of Care (Signed)
 Immediate Anesthesia Transfer of Care Note  Patient: Tirenioluwa Telfair  Procedure(s) Performed: THORACIC LAMINECTOMY FOR SPINAL CORD STIMULATOR (Back)  Patient Location: PACU  Anesthesia Type:General  Level of Consciousness: drowsy  Airway & Oxygen  Therapy: Patient Spontanous Breathing and Patient connected to face mask oxygen   Post-op Assessment: Report given to RN and Post -op Vital signs reviewed and stable  Post vital signs: Reviewed and stable  Last Vitals:  Vitals Value Taken Time  BP 80/49 09/11/23 1212  Temp    Pulse 68 09/11/23 1215  Resp 13 09/11/23 1215  SpO2 97 % 09/11/23 1215  Vitals shown include unfiled device data.  Last Pain:  Vitals:   09/11/23 0945  TempSrc:   PainSc: 8          Complications: No notable events documented.

## 2023-09-11 NOTE — Discharge Summary (Signed)
 Discharge Summary  Patient ID: Terri Woods MRN: [id number] DOB/AGE: December 25, 1970 53 y.o.  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    Discharge Diagnoses:  Active Problems:   * No active hospital problems. *   Discharged Condition: good  Hospital Course:  Terri Woods is a 53 y.o presenting with chronic pain s/p SCS placement. Her intraoperative course was uncomplicated however her post operative course was complicated by elevated blood glucose levels requiring an temporary insulin  drip. This improved greatly and she was able to discharge home on the afternoon of POD0 with instructions to monitor her glucose closely at home. She was given medications for pain, muscle relaxation, and stool softner.   Consults: None  Significant Diagnostic Studies: NA  Treatments: surgery: as above.  Please see separately dictated operative report for further details.  Discharge Exam: Blood pressure (!) 148/82, pulse 100, temperature 98.8 F (37.1 C), temperature source Temporal, resp. rate 18, height 5\' 1"  (1.549 m), weight 70.3 kg, SpO2 98%. CN grossly intact MAEW Incisions c/d/I with dressings in place  Disposition: Discharge disposition: 01-Home or Self Care        Allergies as of 09/11/2023       Reactions   Exenatide Anaphylaxis   Levodopa Hives   Lyrica [pregabalin] Anaphylaxis, Hives   Other Anaphylaxis   Mace    Rotigotine Hives   Erythromycin Rash, Other (See Comments)   GI Upset   Bee Venom Swelling   Fentanyl  Itching   Hysingla Er [hydrocodone Bitartrate Er] Itching   Meloxicam  Itching   Doxycycline Rash   Morphine Rash   Penicillins Hives, Nausea And Vomiting, Rash   Semaglutide Hives, Swelling   Hives, diarrhea, vomiting   Shellfish Allergy Rash         Medication List     PAUSE taking these medications    aspirin  EC 81 MG tablet Wait to take this until your doctor or other care provider tells you to start again. Take 81 mg by mouth daily. Swallow whole.       TAKE these medications    albuterol  108 (90 Base) MCG/ACT inhaler Commonly known as: VENTOLIN  HFA Inhale 1-2 puffs into the lungs every 4 (four) hours as needed for wheezing or shortness of breath.   atorvastatin  40 MG tablet Commonly known as: LIPITOR Take 40 mg by mouth daily.   bimatoprost 0.01 % Soln Commonly known as: LUMIGAN Place 1 drop into both eyes at bedtime.   brimonidine  0.2 % ophthalmic solution Commonly known as: ALPHAGAN  Place 1 drop into both eyes in the morning and at bedtime.   clonazePAM  2 MG tablet Commonly known as: KLONOPIN  Take 2 mg by mouth 2 (two) times daily.   cyclobenzaprine  10 MG tablet Commonly known as: FLEXERIL  Take 1 tablet (10 mg total) by mouth every 6 (six) hours as needed for muscle spasms. What changed: when to take this   diclofenac Sodium 1 % Gel Commonly known as: VOLTAREN Apply 1 Application topically 3 (three) times daily as needed (back pain).   docusate sodium  100 MG capsule Commonly known as: COLACE Take 100 mg by mouth 2 (two) times daily as needed for mild constipation or moderate constipation.   DULoxetine  60 MG capsule Commonly known as: CYMBALTA  Take 60 mg by mouth 2 (two) times daily.   EPINEPHrine  0.3 mg/0.3 mL Soaj injection  Commonly known as: EpiPen  2-Pak Inject 0.3 mg into the muscle as needed.   fluticasone  50 MCG/ACT nasal spray Commonly known as: FLONASE  Place 2 sprays into both nostrils daily. What changed:  when to take this reasons to take this   glipiZIDE  10 MG tablet Commonly known as: GLUCOTROL  Take 20 mg by mouth 2 (two) times daily before a meal.   hydrOXYzine 25 MG tablet Commonly known as: ATARAX Take 25 mg by mouth every 6 (six) hours as needed for itching or  anxiety.   ibuprofen  800 MG tablet Commonly known as: ADVIL  Take 800 mg by mouth every 8 (eight) hours as needed for moderate pain (pain score 4-6).   ketoconazole 2 % shampoo Commonly known as: NIZORAL Apply 1 Application topically 2 (two) times a week.   Lantus  100 UNIT/ML injection Generic drug: insulin  glargine Inject 50 Units into the skin at bedtime.   lidocaine  5 % Commonly known as: LIDODERM  Place 1 patch onto the skin daily. Remove & Discard patch within 12 hours or as directed by MD   lidocaine  5 % ointment Commonly known as: XYLOCAINE  Apply 1 Application topically 3 (three) times daily as needed for moderate pain (pain score 4-6).   liraglutide 18 MG/3ML Sopn Commonly known as: VICTOZA Inject 1.2 mg into the skin daily.   lisinopril  20 MG tablet Commonly known as: ZESTRIL  Take 20 mg by mouth in the morning and at bedtime.   lubiprostone 24 MCG capsule Commonly known as: AMITIZA 24 mcg 2 (two) times daily as needed for constipation.   naloxone  4 MG/0.1ML Liqd nasal spray kit Commonly known as: NARCAN  Place 1 spray into the nose as needed (accidental overdose).   omeprazole 20 MG capsule Commonly known as: PRILOSEC Take 20 mg by mouth daily.   oxybutynin 5 MG tablet Commonly known as: DITROPAN Take 5 mg by mouth daily as needed for bladder spasms.   oxyCODONE -acetaminophen  5-325 MG tablet Commonly known as: PERCOCET/ROXICET Take 1 tablet by mouth every 6 (six) hours as needed for severe pain (pain score 7-10).   OxyCONTIN  10 MG 12 hr tablet Generic drug: oxyCODONE  Take 10 mg by mouth every 8 (eight) hours. What changed: Another medication with the same name was added. Make sure you understand how and when to take each.   oxyCODONE  5 MG immediate release tablet Commonly known as: Roxicodone  Take 1 tablet (5 mg total) by mouth every 6 (six) hours as needed for severe pain (pain score 7-10) (pain refractory to home Percocet and Oxycotin). What changed:  You were already taking a medication with the same name, and this prescription was added. Make sure you understand how and when to take each.   promethazine -dextromethorphan 6.25-15 MG/5ML syrup Commonly known as: PROMETHAZINE -DM Take 5 mLs by mouth 4 (four) times daily as needed for cough.   QUEtiapine  300 MG tablet Commonly known as: SEROQUEL  Take 300 mg by mouth at bedtime.   QUEtiapine  50 MG tablet Commonly known as: SEROQUEL  Take 50 mg by mouth at bedtime.   senna 8.6 MG Tabs tablet Commonly known as: SENOKOT Take 1 tablet (8.6 mg total) by mouth daily as needed.   SUMAtriptan 100 MG tablet Commonly known as: IMITREX Take 100 mg by mouth every 2 (two) hours as needed for migraine. May repeat in 2 hours if headache persists or recurs.   triamcinolone  cream 0.1 % Commonly known as: KENALOG  Apply 1 Application topically daily as needed (ITCHING).         Signed: Aeris Hersman  Vonne Guarneri 09/11/2023, 11:48 AM

## 2023-09-11 NOTE — H&P (Signed)
 Referring Physician:  Jodeen Munch, MD [address] [place],  [place] [address]  Primary Physician:  [facility]  History of Present Illness: 09/11/2023 Terri Woods presents today for SCS placement.  05/30/2023 Terri Woods returns to see me.  She had a successful Medtronic stimulator trial for diabetic neuropathy.   12/11/2022 Terri Woods is here today with a chief complaint of low back pain that radiates into the and bilateral hips and legs.   She has been having pain for many years in multiple locations.  She gets shooting pains down her legs.  She has severe back pain that is impacted by walking, standing, bending, and lifting.  Nothing really helps.  Her pain is very severe.  She is in the pain clinic and has been undergoing multimodal pain management for some time.  Nothing has really helped her.   Bowel/Bladder Dysfunction: none  Conservative measures: seen a chiropractor, had massage  Physical therapy: has participated in Home Health PT about 6 months ago.  Multimodal medical therapy including regular antiinflammatories:  ibuprofen , cymbalta , gabapentin , lidocaine  patch, oxycodone  Injections:  has received epidural steroid injections Interlaminar epidural steroid injection at L5-S1 11/28/2021.  Left RFA to L3, L4, L5 in 2019  Bilateral L3-5 RFA in (right 08/11/15, left 08/19/15) with significant improvement Bilateral L3-L5 RFA (right 10/29/16, left 10/15/16) Left L3-5 RFA (02/03/18)with benefit C7/T1 ILESI (11/25/18) - beneficial C7/T1 ILESI (11/13/19)   Past Surgery: denies  Terri Woods has no symptoms of cervical myelopathy.  The symptoms are causing a significant impact on the patient's life.   I have utilized the care everywhere function in epic to review the outside records available from external health systems.  Review of Systems:  A 10 point review of systems is negative, except for the pertinent positives and  negatives detailed in the HPI.  Past Medical History: Past Medical History:  Diagnosis Date   Anxiety    Arthritis    neck, hips, fingers   Asthma    Back pain    Breast cancer (HCC) 2001   RT LUMPECTOMY PER PT   Carpal tunnel syndrome    right arm   Crohn's disease (HCC)    Depression    Diabetes (HCC)    Fibromyalgia    GERD (gastroesophageal reflux disease)    Glaucoma    Headache    History of kidney stones    Hypertension    Stroke [facility])    eye    Past Surgical History: Past Surgical History:  Procedure Laterality Date   BILATERAL CARPAL TUNNEL RELEASE Bilateral 02/05/2019   Procedure: BILATERAL CARPAL TUNNEL RELEASE;  Surgeon: Molli Angelucci, MD;  Location: [facility] ORS;  Service: Orthopedics;  Laterality: Bilateral;   BREAST EXCISIONAL BIOPSY Right 15+ yrs ago   NEG   BREAST SURGERY     CATARACT EXTRACTION W/PHACO Right 10/30/2021   Procedure: CATARACT EXTRACTION PHACO AND INTRAOCULAR LENS PLACEMENT (IOC) RIGHT DIABETIC 1.85 00:20.9;  Surgeon: Rosa College, MD;  Location: [facility];  Service: Ophthalmology;  Laterality: Right;  Diabetic   COLONOSCOPY WITH PROPOFOL  N/A 03/10/2020   Procedure: COLONOSCOPY WITH PROPOFOL ;  Surgeon: Conrado Delay, DO;  Location: [facility] ENDOSCOPY;  Service: General;  Laterality: N/A;   FOOT SURGERY Left    LYMPH NODE BIOPSY     NASAL SEPTOPLASTY W/ TURBINOPLASTY Bilateral 12/25/2018   Procedure: NASAL SEPTOPLASTY WITH INFERIOR TURBINATE REDUCTION;  Surgeon: Mellody Sprout, MD;  Location: [facility];  Service: ENT;  Laterality: Bilateral;   SHOULDER SURGERY     SMALL INTESTINE SURGERY      Allergies: Allergies as of 09/03/2023 - Review Complete 08/19/2023  Allergen Reaction Noted   Exenatide Anaphylaxis 02/27/2023   Levodopa Hives 09/29/2019   Lyrica [pregabalin] Anaphylaxis and Hives 10/23/2021   Other Anaphylaxis 10/25/2021   Rotigotine Hives 01/27/2020   Erythromycin Rash and Other (See Comments) 09/20/2014    Bee venom Swelling 09/20/2014   Fentanyl  Itching 10/23/2021   Hysingla er [hydrocodone bitartrate er] Itching 10/23/2021   Meloxicam  Itching 10/23/2021   Doxycycline Rash 09/20/2014   Morphine Rash 10/23/2021   Penicillins Hives, Nausea And Vomiting, and Rash 01/06/2020   Semaglutide Hives and Swelling 01/10/2023   Shellfish allergy Rash 09/20/2014    Medications:  Current Facility-Administered Medications:    acetaminophen  (TYLENOL ) tablet 1,000 mg, 1,000 mg, Oral, Once, Terri Woods, Terri Pai, MD   ceFAZolin  (ANCEF ) IVPB 2g/100 mL premix, 2 g, Intravenous, Once, Jodeen Munch, MD   oxyCODONE  (Oxy IR/ROXICODONE ) immediate release tablet 15 mg, 15 mg, Oral, Once, Terri Woods, Terri Pai, MD   sodium chloride  flush (NS) 0.9 % injection 3-10 mL, 3-10 mL, Intravenous, Q12H, Terri Bonier, MD   sodium chloride  flush (NS) 0.9 % injection 3-10 mL, 3-10 mL, Intravenous, PRN, Terri Bonier, MD   vancomycin (VANCOCIN) IVPB 1000 mg/200 mL premix, 1,000 mg, Intravenous, Once, Jodeen Munch, MD, Last Rate: 200 mL/hr at 09/11/23 0911, 1,000 mg at 09/11/23 0911  Social History: Social History   Tobacco Use   Smoking status: Never   Smokeless tobacco: Never  Vaping Use   Vaping status: Never Used  Substance Use Topics   Alcohol use: No    Alcohol/week: 0.0 standard drinks of alcohol   Drug use: Yes    Types: Oxycodone     Family Medical History: Family History  Problem Relation Age of Onset   Heart attack Sister 30   Heart disease Father    Breast cancer Mother    Breast cancer Maternal Aunt     Physical Examination: Vitals:   09/11/23 0851  BP: (!) 148/82  Pulse: 100  Resp: 18  Temp: 98.8 F (37.1 C)  SpO2: 98%   Heart sounds normal no MRG. Chest Clear to Auscultation Bilaterally.  General: Patient is in moderate distress. Attention to examination is appropriate.  Neck:   Supple.  Full range of motion.  Respiratory: Patient is breathing without  any difficulty.   NEUROLOGICAL:     Awake, alert, oriented to person, place, and time.  Speech is clear and fluent.   Cranial Nerves: Pupils equal round and reactive to light.  Facial tone is symmetric.  Facial sensation is symmetric. Shoulder shrug is symmetric. Tongue protrusion is midline.  There is no pronator drift.  Strength: Side Biceps Triceps Deltoid Interossei Grip Wrist Ext. Wrist Flex.  R 5 5 5 5 5 5 5   L 5 5 5 5 5 5 5    Side Iliopsoas Quads Hamstring PF DF EHL  R 5 5 5 5 5 5   L 5 5 5 5 5 5    Reflexes are 1+ and symmetric at the biceps, triceps, brachioradialis, patella and achilles.   Hoffman's is absent.   Bilateral upper and lower extremity sensation is intact to light touch.    No evidence of dysmetria noted.  Gait is untested.     Medical Decision Making  Imaging: MRI L spine 11/14/2022  IMPRESSION: 1. No acute findings or explanation for the patient's symptoms. 2. Mild  disc bulging and facet hypertrophy at L3-4 and L4-5 without resulting spinal stenosis or nerve root encroachment. 3. Previously demonstrated left subarticular zone disc protrusion at L5-S1 has largely involuted.     Electronically Signed   By: Elmon Hagedorn M.D.   On: 11/23/2022 14:05 I have personally reviewed the images and agree with the above interpretation.  Assessment and Plan: Ms. Talk is a pleasant 53 y.o. female with chronic back and leg pain.  She has painful diabetic neuropathy.  She has chronic pain syndrome due to this.  We will proceed with placement of a spinal cord stimulator.     Neville Pauls K. Mont Antis MD, [facility] Neurosurgery

## 2023-09-11 NOTE — Progress Notes (Signed)
 Patient arrived from the OR with a neo drip and Insulin  drip infusing. Dr Lincoln Renshaw came to bedside and placed orders for both with parameters. BLood drawn per him for BMP as well.

## 2023-09-11 NOTE — Discharge Instructions (Addendum)
 NEUROSURGERY DISCHARGE INSTRUCTIONS  Admission diagnosis: Chronic pain syndrome [G89.4] Diabetic polyneuropathy associated with type 2 diabetes mellitus (HCC) [E11.42] Chronic bilateral low back pain with bilateral sciatica [M54.42, M54.41, G89.29]  Operative procedure: Spinal cord stimulator placement  What to do after you leave the hospital:  Recommended diet: regular diet. Increase protein intake to promote wound healing.  Recommended activity: no lifting, driving, or strenuous exercise for 4 weeks . You should walk multiple times per day  Special Instructions  No straining, no heavy lifting > 10lbs x 4 weeks.  Keep incision area clean and dry. May shower in 2 days. No baths or pools for 6 weeks.  Please remove dressing tomorrow, no need to apply a bandage afterwards  You have sutures or staples that will be removed in clinic.   Please take pain medications as directed. Take a stool softener if on pain medications  *Regarding compression stockings-  Please wear day and night until you are walking a couple hundred feet three times a day.   Please Report any of the following: Nausea or Vomiting, Temperature is greater than 101.85F (38.1C) degrees, Dizziness, Abdominal Pain, Difficulty Breathing or Shortness of Breath, Inability to Eat, drink Fluids, or Take medications, Bleeding, swelling, or drainage from surgical incision sites, New numbness or weakness, and Bowel or bladder dysfunction to the neurosurgeon on call. How to contact us :  If you have any questions/concerns before or after surgery, you can reach us  at [phone], or you can send a mychart message. We can be reached by phone or mychart 8am-4pm, Monday-Friday.  *Please note: Calls after 4pm are forwarded to a third party answering service. Mychart messages are not routinely monitored during evenings, weekends, and holidays. Please call our office to contact the answering service for urgent concerns during non-business hours.    Additional Follow up appointments Please follow up with Lucetta Russel as scheduled in 2-3 weeks   Please see below for scheduled appointments:  Future Appointments  Date Time Provider Department Center  09/24/2023  2:00 PM Lucetta Russel, [place] [facility] None  10/22/2023 10:45 AM Jodeen Munch, [place] CNS[facility] None  12/03/2023 10:30 AM Lucetta Russel, PA-C CNS[facility] None

## 2023-09-11 NOTE — Anesthesia Procedure Notes (Signed)
 Procedure Name: Intubation Date/Time: 09/11/2023 10:15 AM  Performed by: Juanda Noon, CRNAPre-anesthesia Checklist: Patient identified, Patient being monitored, Timeout performed, Emergency Drugs available and Suction available Patient Re-evaluated:Patient Re-evaluated prior to induction Oxygen  Delivery Method: Circle system utilized Preoxygenation: Pre-oxygenation with 100% oxygen  Induction Type: IV induction Ventilation: Mask ventilation without difficulty Laryngoscope Size: Mac, 3 and McGrath Grade View: Grade I Tube type: Oral Tube size: 7.0 mm Number of attempts: 1 Airway Equipment and Method: Stylet Placement Confirmation: ETT inserted through vocal cords under direct vision, positive ETCO2 and breath sounds checked- equal and bilateral Secured at: 22 cm Tube secured with: Tape Dental Injury: Teeth and Oropharynx as per pre-operative assessment

## 2023-09-12 ENCOUNTER — Encounter: Payer: Self-pay | Admitting: Neurosurgery

## 2023-09-16 ENCOUNTER — Telehealth: Payer: Self-pay | Admitting: Neurosurgery

## 2023-09-16 ENCOUNTER — Other Ambulatory Visit: Payer: Self-pay | Admitting: Neurosurgery

## 2023-09-16 MED ORDER — HYDROMORPHONE HCL 2 MG PO TABS: 1.0000 mg | ORAL_TABLET | ORAL | 0 refills | Status: DC | PRN

## 2023-09-16 NOTE — Telephone Encounter (Signed)
Attempted to call patient again. 

## 2023-09-16 NOTE — Telephone Encounter (Signed)
 I attempted to call patient back for more information. I left a voicemail, advised patient to call back.

## 2023-09-16 NOTE — Telephone Encounter (Signed)
 I notified the patient of Danielle's recommendations. She verbalized understanding and will stop the percocet and oxycodone . I reiterated that she will need to reach out to her pain management provider for future refills or adjustments. She also reports she took the bandage off and she put neosporin on her incision. I advised her that we do not recommend putting anything on her incision at this time and to contact us  if she or her husband notice any concerns with her incision.

## 2023-09-16 NOTE — Telephone Encounter (Signed)
 Post-op SCS on 09/11/2023   Patient called stating she is in a lot of pain (mainly in the area of her incision) and not sure what to do.  She has been taking her pain medication and nothing is working for her. Please advise.

## 2023-09-16 NOTE — Telephone Encounter (Signed)
 Patient is calling back to see if her medication could be sent to [facility] in [place]. She also states that her husband took off her bandage earlier because it was pulling and she wants to make sure this is okay. Please advise.

## 2023-09-16 NOTE — Telephone Encounter (Signed)
 DOS: 09/11/2023 Thoracic Laminectomy Spinal Cord Stimulator.   Patient is complaining that she is having shoulder blades and near her incision. She states worsening pain since  Sunday at noon. Currently 8/10 on the pain scale after taking medications as prescribed below. She states that there is no redness or irritation around her incisions.   Ibuprofen  800mg  every 8 hours Flexeril  10mg  every 6 hours Oxycontin  10mg  every 8 hours Percocet 5/325mg  every 6 hours Oxycodone  5 mg every 6 hours  Cymbalta  60mg  two times daily  She said that in the past  Dilaudid  has been more effective for her break through pain than oxycodone . Can we give her Hydromorphone  instead of the oxycodone  to see if it helps her pain control in the immediate post-operative period?

## 2023-09-20 NOTE — Progress Notes (Deleted)
   REFERRING PHYSICIAN:  [facility] [address] [place],  [place] [address]  DOS: 09/11/23  placement of medtronic SCS  HISTORY OF PRESENT ILLNESS: Terri Woods is approximately *** status post ***. Was given *** on discharge from the hospital.   She takes chronic oxycontin  10mg  and percocet 5/325. Was initially given oxycodone  for breakthrough pain. She called on 09/16/23 and this was changed- she was to continue with oxycontin  and prn dilaudid . Was to stop percocet/oxycodone  and was to reach out to her pain management provider with any other concerns.       PHYSICAL EXAMINATION:  General: Patient is well developed, well nourished, calm, collected, and in no apparent distress.   NEUROLOGICAL:  General: In no acute distress.   Awake, alert, oriented to person, place, and time.  Pupils equal round and reactive to light.  Facial tone is symmetric.     Strength:            Side Iliopsoas Quads Hamstring PF DF EHL  R 5 5 5 5 5 5   L 5 5 5 5 5 5    Incision c/d/i   ROS (Neurologic):  Negative except as noted above  IMAGING: Nothing new to review.   ASSESSMENT/PLAN:  Terri Woods is doing well s/p above surgery. Treatment options reviewed with patient and following plan made:   - I have advised the patient to lift up to 10 pounds until 6 weeks after surgery (follow up with Dr. Mont Antis).  - Reviewed wound care.  - No bending, twisting, or lifting.  - Continue on current medications including ***.  - Follow up as scheduled in 4 weeks and prn.   Advised to contact the office if any questions or concerns arise.  Lucetta Russel PA-C Department of neurosurgery

## 2023-09-24 ENCOUNTER — Encounter: Admitting: Orthopedic Surgery

## 2023-09-24 DIAGNOSIS — G894 Chronic pain syndrome: Secondary | ICD-10-CM

## 2023-09-24 DIAGNOSIS — Z9689 Presence of other specified functional implants: Secondary | ICD-10-CM

## 2023-09-25 ENCOUNTER — Ambulatory Visit (INDEPENDENT_AMBULATORY_CARE_PROVIDER_SITE_OTHER): Admitting: Physician Assistant

## 2023-09-25 ENCOUNTER — Encounter: Payer: Self-pay | Admitting: Physician Assistant

## 2023-09-25 VITALS — BP 116/70 | Temp 97.5°F | Ht 61.0 in | Wt 155.0 lb

## 2023-09-25 DIAGNOSIS — Z09 Encounter for follow-up examination after completed treatment for conditions other than malignant neoplasm: Secondary | ICD-10-CM

## 2023-09-25 DIAGNOSIS — G894 Chronic pain syndrome: Secondary | ICD-10-CM

## 2023-09-25 MED ORDER — TIZANIDINE HCL 4 MG PO TABS: 4.0000 mg | ORAL_TABLET | Freq: Four times a day (QID) | ORAL | 1 refills | Status: DC | PRN

## 2023-09-25 MED ORDER — HYDROMORPHONE HCL 2 MG PO TABS: 1.0000 mg | ORAL_TABLET | ORAL | 0 refills | Status: AC | PRN

## 2023-09-25 NOTE — Progress Notes (Unsigned)
   REFERRING PHYSICIAN:  [facility] [address] [place],  [place] [address]  DOS: 09/11/23  placement of medtronic SCS  HISTORY OF PRESENT ILLNESS: Terri Woods is approximately 2 weeks  status post thoracic lami for spinal cord stimulator placement . She takes chronic oxycontin  10mg  and percocet 5/325. Was initially given oxycodone  for breakthrough pain. She called on 09/16/23 and this was changed- she was to continue with oxycontin  and prn dilaudid . Was to stop percocet/oxycodone  and was to reach out to her pain management provider with any other concerns.  She states that the breakthrough Dilaudid  is only thing it is helping her pain right now.  Pain is primarily at the incision site with intermittent pain down both her legs.  No new weakness numbness or tingling.      PHYSICAL EXAMINATION:  General: Patient is well developed, well nourished, calm, collected, and in no apparent distress.   NEUROLOGICAL:  General: In no acute distress.   Awake, alert, oriented to person, place, and time.    Strength:            Side Iliopsoas Quads Hamstring PF DF EHL  R 5 5 5 5 5 5   L 5 5 5 5 5 5    Incision c/d/i   ROS (Neurologic):  Negative except as noted above  IMAGING: Nothing new to review.   ASSESSMENT/PLAN:  Terri Woods is doing okay s/p above surgery.  Stimulator representative was present at this appointment in April 2 optimize her stimulator and provide counseling.  Treatment options reviewed with patient and following plan made:   - I have advised the patient to lift up to 10 pounds until 6 weeks after surgery (follow up with Dr. Mont Antis).  - Reviewed wound care.  - No bending, twisting, or lifting.  - Continue on current medications including prn Dilaudid  for breakthrough.  Patient is due to follow-up with pain management provider shortly. - Follow up as scheduled in 4 weeks and prn.   Advised to contact the office if any questions or concerns arise.  Ludwig Safer PA-C Department of neurosurgery

## 2023-10-22 ENCOUNTER — Encounter: Payer: Self-pay | Admitting: Neurosurgery

## 2023-10-22 ENCOUNTER — Ambulatory Visit (INDEPENDENT_AMBULATORY_CARE_PROVIDER_SITE_OTHER): Admitting: Neurosurgery

## 2023-10-22 VITALS — BP 128/78 | Ht 61.0 in | Wt 155.0 lb

## 2023-10-22 DIAGNOSIS — G894 Chronic pain syndrome: Secondary | ICD-10-CM

## 2023-10-22 DIAGNOSIS — Z09 Encounter for follow-up examination after completed treatment for conditions other than malignant neoplasm: Secondary | ICD-10-CM

## 2023-10-22 NOTE — Progress Notes (Signed)
   REFERRING PHYSICIAN:  [facility] [address] [place],  [place] [address]  DOS: 09/11/23  placement of medtronic SCS  HISTORY OF PRESENT ILLNESS: Terri Woods is status post thoracic lami for spinal cord stimulator placement.   The device is not helping her too much currently.  She is having some trouble working it.     PHYSICAL EXAMINATION:  General: Patient is well developed, well nourished, calm, collected, and in no apparent distress.   NEUROLOGICAL:  General: In no acute distress.   Awake, alert, oriented to person, place, and time.    Strength:            Side Iliopsoas Quads Hamstring PF DF EHL  R 5 5 5 5 5 5   L 5 5 5 5 5 5    Incision c/d/i   ROS (Neurologic):  Negative except as noted above  IMAGING: Nothing new to review.   ASSESSMENT/PLAN:  Terri Woods is doing okay s/p above surgery.   The stimulator rep will work with her on programming and guidance.  From my standpoint, she can slowly begin to increase her activity.   RTC as needed  Jodeen Munch Department of neurosurgery

## 2023-11-10 ENCOUNTER — Other Ambulatory Visit: Payer: Self-pay | Admitting: Physician Assistant

## 2023-11-11 ENCOUNTER — Other Ambulatory Visit: Payer: Self-pay | Admitting: Physician Assistant

## 2023-11-11 MED ORDER — TIZANIDINE HCL 4 MG PO TABS: 4.0000 mg | ORAL_TABLET | Freq: Four times a day (QID) | ORAL | 1 refills | Status: AC | PRN

## 2023-11-15 ENCOUNTER — Ambulatory Visit (INDEPENDENT_AMBULATORY_CARE_PROVIDER_SITE_OTHER)

## 2023-11-15 ENCOUNTER — Ambulatory Visit
Admission: RE | Admit: 2023-11-15 | Discharge: 2023-11-15 | Disposition: A | Discharge status: Home / Self Care | Source: Ambulatory Visit | Attending: Family Medicine | Admitting: Family Medicine

## 2023-11-15 VITALS — BP 140/75 | HR 86 | Temp 98.0°F | Resp 14 | Ht 61.0 in | Wt 155.0 lb

## 2023-11-15 DIAGNOSIS — M25512 Pain in left shoulder: Secondary | ICD-10-CM

## 2023-11-15 DIAGNOSIS — S43102A Unspecified dislocation of left acromioclavicular joint, initial encounter: Secondary | ICD-10-CM

## 2023-11-15 NOTE — ED Triage Notes (Signed)
 Patient had rotator cuff left shoulder surgery about 5 years.  Patient reports left shoulder pain that started 2 days ago.  Patient denies injury or fall.

## 2023-11-15 NOTE — ED Provider Notes (Signed)
 [facility]    CSN: [id number] Arrival date & time: 11/15/23  9161      History   Chief Complaint Chief Complaint  Patient presents with   Shoulder Pain    left    HPI 53 year old female with chronic pain syndrome presents for evaluation of the above.  2-day history of left shoulder pain.  Occurred after she lifted a saddle.  Localizes the pain particular to the area of the trapezius.  Decreased range of motion.  Has known chronic pain and is under the care of pain management.  She is on OxyContin  and oxycodone .  No radicular symptoms.  No relieving factors.  Past Medical History:  Diagnosis Date   Anxiety    Arthritis    neck, hips, fingers   Asthma    Back pain    Breast cancer (HCC) 2001   RT LUMPECTOMY PER PT   Carpal tunnel syndrome    right arm   Crohn's disease (HCC)    Depression    Diabetes (HCC)    Fibromyalgia    GERD (gastroesophageal reflux disease)    Glaucoma    Headache    History of kidney stones    Hypertension    Stroke [facility])    eye    Patient Active Problem List   Diagnosis Date Noted   Diabetic polyneuropathy associated with type 2 diabetes mellitus (HCC) 09/11/2023   Chronic bilateral low back pain with bilateral sciatica 09/11/2023   Chronic prescription opiate use 03/27/2023   Suicidal ideation 03/26/2023   GAD (generalized anxiety disorder) 03/26/2023   MDD (major depressive disorder) 03/26/2023   Head trauma 03/24/2023   Crush injury 03/24/2023   Loss of consciousness (HCC) 03/24/2023   Loss of vision 03/24/2023   Chronic radicular lumbar pain 03/19/2023   Lumbar spondylosis 03/19/2023   Degeneration of intervertebral disc of lumbar region with discogenic back pain and lower extremity pain 03/19/2023   Chronic pain syndrome 03/19/2023    Past Surgical History:  Procedure Laterality Date   BILATERAL CARPAL TUNNEL RELEASE Bilateral 02/05/2019   Procedure: BILATERAL CARPAL TUNNEL RELEASE;  Surgeon: Kathlynn Sharper, MD;   Location: [facility];  Service: Orthopedics;  Laterality: Bilateral;   BREAST EXCISIONAL BIOPSY Right 15+ yrs ago   NEG   BREAST SURGERY     CATARACT EXTRACTION W/PHACO Right 10/30/2021   Procedure: CATARACT EXTRACTION PHACO AND INTRAOCULAR LENS PLACEMENT (IOC) RIGHT DIABETIC 1.85 00:20.9;  Surgeon: Myrna Adine Anes, MD;  Location: [facility];  Service: Ophthalmology;  Laterality: Right;  Diabetic   COLONOSCOPY WITH PROPOFOL  N/A 03/10/2020   Procedure: COLONOSCOPY WITH PROPOFOL ;  Surgeon: Tye Millet, DO;  Location: [facility];  Service: General;  Laterality: N/A;   FOOT SURGERY Left    LYMPH NODE BIOPSY     NASAL SEPTOPLASTY W/ TURBINOPLASTY Bilateral 12/25/2018   Procedure: NASAL SEPTOPLASTY WITH INFERIOR TURBINATE REDUCTION;  Surgeon: Edda Mt, MD;  Location: [facility];  Service: ENT;  Laterality: Bilateral;   SHOULDER SURGERY     SMALL INTESTINE SURGERY     THORACIC LAMINECTOMY FOR SPINAL CORD STIMULATOR N/A 09/11/2023   Procedure: THORACIC LAMINECTOMY FOR SPINAL CORD STIMULATOR;  Surgeon: Clois Fret, MD;  Location: [facility];  Service: Neurosurgery;  Laterality: N/A;  Spinal Cord Stimulator: MEDTRONIC  [id number], [id number], [id number]    OB History   No obstetric history on file.      Home Medications    Prior to Admission medications   Medication Sig Start  Date End Date Taking? Authorizing Provider  albuterol  (VENTOLIN  HFA) 108 (90 Base) MCG/ACT inhaler Inhale 1-2 puffs into the lungs every 4 (four) hours as needed for wheezing or shortness of breath. 05/09/23   Van Knee, MD  amLODipine  (NORVASC ) 5 MG tablet Take 5 mg by mouth. 03/31/23   [provider]  aspirin  EC 81 MG tablet Take 81 mg by mouth daily. Swallow whole.    [provider]  atorvastatin  (LIPITOR) 40 MG tablet Take 40 mg by mouth daily. 01/29/23   [provider]  bimatoprost (LUMIGAN) 0.01 % SOLN Place 1 drop into both eyes at bedtime.    [provider]  brimonidine  (ALPHAGAN ) 0.2 % ophthalmic solution Place 1 drop into both eyes in the morning and at bedtime.    [provider]  clonazePAM  (KLONOPIN ) 2 MG tablet Take 2 mg by mouth 2 (two) times daily. 05/31/23   [provider]  cyclobenzaprine  (FLEXERIL ) 10 MG tablet Take 10 mg by mouth 3 (three) times daily. 10/04/23   [provider]  diclofenac Sodium (VOLTAREN) 1 % GEL Apply 1 Application topically 3 (three) times daily as needed (back pain).    [provider]  docusate sodium  (COLACE) 100 MG capsule Take 100 mg by mouth 2 (two) times daily as needed for mild constipation or moderate constipation.    [provider]  DULoxetine  (CYMBALTA ) 60 MG capsule Take 60 mg by mouth 2 (two) times daily.    [provider]  EPINEPHrine  (EPIPEN  2-PAK) 0.3 mg/0.3 mL IJ SOAJ injection Inject 0.3 mg into the muscle as needed. 02/09/23   Floy Roberts, MD  fluticasone  (FLONASE ) 50 MCG/ACT nasal spray Place 2 sprays into both nostrils daily. Patient taking differently: Place 2 sprays into both nostrils daily as needed for allergies. 05/09/23   Van Knee, MD  glipiZIDE  (GLUCOTROL ) 10 MG tablet Take 20 mg by mouth 2 (two) times daily before a meal. 06/05/23   [provider]  hydrOXYzine (ATARAX) 25 MG tablet Take 25 mg by mouth every 6 (six) hours as needed for itching or anxiety. 06/03/23   [provider]  ibuprofen  (ADVIL ) 800 MG tablet Take 800 mg by mouth every 8 (eight) hours as needed for moderate pain (pain score 4-6).    [provider]  ketoconazole (NIZORAL) 2 % shampoo Apply 1 Application topically 2 (two) times a week.    [provider]  LANTUS  100 UNIT/ML injection Inject 50 Units into the skin at bedtime. 04/19/23 04/18/24  [provider]  lidocaine  (LIDODERM ) 5 % Place 1 patch onto the skin daily. Remove & Discard patch within 12 hours or as directed by MD    [provider]  lidocaine  (XYLOCAINE ) 5 % ointment Apply 1 Application topically 3 (three) times daily as needed for moderate pain (pain score 4-6).    [provider]  liraglutide (VICTOZA) 18 MG/3ML SOPN Inject 1.2 mg into the skin daily.    [provider]  lisinopril  (ZESTRIL ) 20 MG tablet Take 20 mg by mouth in the morning and at bedtime.    [provider]  lubiprostone (AMITIZA) 24 MCG capsule 24 mcg 2 (two) times daily as needed for constipation. 07/09/23   [provider]  naloxone  (NARCAN ) nasal spray 4 mg/0.1 mL Place 1 spray into the nose as needed (accidental overdose).    [provider]  omeprazole (PRILOSEC) 20 MG capsule Take 20 mg by mouth daily.    [provider]  oxybutynin (DITROPAN) 5 MG tablet Take 5 mg by mouth daily as needed for bladder spasms. 06/03/23   [provider]  oxyCODONE -acetaminophen  (PERCOCET/ROXICET) 5-325 MG tablet Take one tablet every 6-8 hours as needed for pain. DNF: 11/28/23 10/30/23   [provider]  OXYCONTIN  10 MG 12 hr tablet Take 10 mg by mouth every 8 (eight) hours.    [provider]  promethazine -dextromethorphan (PROMETHAZINE -DM) 6.25-15 MG/5ML syrup Take 5 mLs by mouth 4 (four) times daily as needed for cough. 05/09/23   Van Knee, MD  QUEtiapine  (SEROQUEL ) 300 MG tablet Take 300 mg by mouth at bedtime.    [provider]  QUEtiapine  (SEROQUEL ) 50 MG tablet Take 50 mg by mouth at bedtime.    [provider]  senna (SENOKOT) 8.6 MG TABS tablet Take 1 tablet (8.6 mg total) by mouth daily as needed. 09/11/23   Gregory Edsel Ruth, PA  SUMAtriptan (IMITREX) 100 MG tablet Take 100 mg by mouth every 2 (two) hours as needed for migraine. May repeat in 2 hours if headache persists or recurs.    [provider]  tiZANidine  (ZANAFLEX ) 4 MG tablet TAKE 1 TABLET(4 MG) BY MOUTH EVERY 6 HOURS AS NEEDED FOR MUSCLE SPASMS 11/11/23   Ulis Bottcher,  PA-C  tiZANidine  (ZANAFLEX ) 4 MG tablet Take 1 tablet (4 mg total) by mouth every 6 (six) hours as needed for muscle spasms. 11/11/23 11/10/24  Ulis Bottcher, PA-C  triamcinolone  cream (KENALOG ) 0.1 % Apply 1 Application topically daily as needed (ITCHING). 08/31/19 12/31/23  [provider]  topiramate (TOPAMAX) 100 MG tablet Take 100 mg by mouth 2 (two) times daily.  01/06/20  [provider]    Family History Family History  Problem Relation Age of Onset   Heart attack Sister 22   Heart disease Father    Breast cancer Mother    Breast cancer Maternal Aunt     Social History Social History   Tobacco Use   Smoking status: Never   Smokeless tobacco: Never  Vaping Use   Vaping status: Never Used  Substance Use Topics   Alcohol use: No    Alcohol/week: 0.0 standard drinks of alcohol   Drug use: Yes    Types: Oxycodone      Allergies   Exenatide, Levodopa, Lyrica [pregabalin], Other, Rotigotine, Erythromycin, Bee venom, Fentanyl , Hysingla er [hydrocodone bitartrate er], Meloxicam , Doxycycline, Morphine, Penicillins, Semaglutide, and Shellfish allergy   Review of Systems Review of Systems Per HPI  Physical Exam Triage Vital Signs ED Triage Vitals  Encounter Vitals Group     BP 11/15/23 0854 (!) 140/75     Girls Systolic BP Percentile --      Girls Diastolic BP Percentile --      Boys Systolic BP Percentile --      Boys Diastolic BP Percentile --      Pulse Rate 11/15/23 0854 86     Resp 11/15/23 0854 14     Temp 11/15/23 0854 98 F (36.7 C)     Temp Source 11/15/23 0854 Oral     SpO2 11/15/23 0854 96 %     Weight 11/15/23 0853 154 lb 15.7 oz (70.3 kg)     Height 11/15/23 0853 5' 1 (1.549 m)     Head Circumference --      Peak Flow --      Pain Score 11/15/23 0852 8     Pain Loc --      Pain Education --  Exclude from Growth Chart --    No data found.  Updated Vital Signs BP (!) 140/75 (BP Location: Right Arm)   Pulse 86   Temp 98 F  (36.7 C) (Oral)   Resp 14   Ht 5' 1 (1.549 m)   Wt 70.3 kg   LMP 12/17/2022 (Approximate)   SpO2 96%   BMI 29.28 kg/m   Visual Acuity Right Eye Distance:   Left Eye Distance:   Bilateral Distance:    Right Eye Near:   Left Eye Near:    Bilateral Near:     Physical Exam Vitals and nursing note reviewed.  Constitutional:      General: She is not in acute distress.    Appearance: Normal appearance.  HENT:     Head: Normocephalic and atraumatic.  Cardiovascular:     Rate and Rhythm: Normal rate and regular rhythm.  Pulmonary:     Effort: Pulmonary effort is normal.     Breath sounds: Normal breath sounds. No wheezing or rales.  Musculoskeletal:     Comments: Left shoulder -decreased range of motion secondary to pain.  Supraspinatus appears to be intact.  Difficulty assessing infraspinatus/teres minor due to pain.  Tenderness over the trapezius.  Neurological:     Mental Status: She is alert.      UC Treatments / Results  Labs (all labs ordered are listed, but only abnormal results are displayed) Labs Reviewed - No data to display  EKG   Radiology No results found.  Procedures Procedures (including critical care time)  Medications Ordered in UC Medications - No data to display  Initial Impression / Assessment and Plan / UC Course  I have reviewed the triage vital signs and the nursing notes.  Pertinent labs & imaging results that were available during my care of the patient were reviewed by me and considered in my medical decision making (see chart for details).    53 year old female presents with acute left shoulder pain.  X-ray was obtained and revealed left AC joint separation.  Placed in sling for comfort.  Continue home medications.  Advised to follow-up with orthopedics.  Final Clinical Impressions(s) / UC Diagnoses   Final diagnoses:  Acute pain of left shoulder   Discharge Instructions   None    ED Prescriptions   None    PDMP not  reviewed this encounter.   Dezhane Staten G, [place] 11/15/23 [phone]

## 2023-11-15 NOTE — Discharge Instructions (Signed)
 Sling for comfort.  Continue your home medications.  Follow up with Orthopedics.

## 2023-11-29 NOTE — Progress Notes (Addendum)
   REFERRING PHYSICIAN:  [facility] [address] [place],  [place] [address]  DOS: 09/11/23  placement of medtronic SCS  HISTORY OF PRESENT ILLNESS:  She was still having pain at last visit- she was having difficulty working her SCS- she met with SCS reps and was to follow up prn.   She is not having much relief with her SCS. She is able to use and charge it.   She continues with constant LBP with bilateral posterior/lateral leg pain to her feet. LBP > leg pain, right leg pain = left leg pain. She has numbness, tingling, and weakness in her legs. Pain was severe in legs last night and today they feel tired.   She is scheduled for MRI of left shoulder in August- thinks she has a torn rotator cuff.   She is seeing pain management at [facility].    PHYSICAL EXAMINATION:  General: Patient is well developed, well nourished, calm, collected, and in no apparent distress.   NEUROLOGICAL:  General: In no acute distress.   Awake, alert, oriented to person, place, and time.    Strength:          Side Iliopsoas Quads Hamstring PF DF EHL  R 5 5 5 5 5 5   L 5 5 5 5 5 5    Incisions well healed  Sensation is intact to light touch in bilateral lower extremities.   She has normal gait.    ROS (Neurologic):  Negative except as noted above  IMAGING: Nothing new to review.   ASSESSMENT/PLAN:  Terri Woods is not having much relief of pain with SCS.    She has constant LBP with bilateral posterior/lateral leg pain to her feet. LBP > leg pain, right leg pain = left leg pain. She has numbness, tingling, and weakness in her legs.   Treatment options reviewed with patient and following plan made:   - She will contact [employer] rep to discuss reprogramming.  - Thoracic xray ordered to check placement of SCS lead.  - Will call her next week to check on progress and let her know about xray results.  - She will continue to follow up with pain management at [facility].  - If no improvement with above,  consider updated lumbar MRI.  Advised to contact the office if any questions or concerns arise.  I spent a total of 25 minutes in face-to-face and non-face-to-face activities related to this patient's care today including review of outside records, review of imaging, review of symptoms, physical exam, discussion of differential diagnosis, discussion of treatment options, and documentation.   ADDENDUM 12/09/23:  Thoracic xrays dated 12/03/23:  FINDINGS: There is no evidence of thoracic spine fracture. Alignment is normal. No other significant bone abnormalities are identified. Electronic stimulation wires noted centered at the T9 level.   IMPRESSION: Stimulation wires as described.     Electronically Signed   By: Fonda Field M.D.   On: 12/08/2023 19:06   I have personally reviewed the images and agree with the above interpretation.  SCS lead appears to be in good position. No change in comparison to postop xrays. Will call to check on her in a few days and let her know about xray results.   Glade Boys PA-C Department of neurosurgery

## 2023-12-03 ENCOUNTER — Ambulatory Visit
Admission: RE | Admit: 2023-12-03 | Discharge: 2023-12-03 | Disposition: A | Discharge status: Home / Self Care | Source: Ambulatory Visit | Attending: Orthopedic Surgery | Admitting: Orthopedic Surgery

## 2023-12-03 ENCOUNTER — Ambulatory Visit
Admission: RE | Admit: 2023-12-03 | Discharge: 2023-12-03 | Disposition: A | Discharge status: Home / Self Care | Attending: Orthopedic Surgery | Admitting: Orthopedic Surgery

## 2023-12-03 ENCOUNTER — Ambulatory Visit (INDEPENDENT_AMBULATORY_CARE_PROVIDER_SITE_OTHER): Admitting: Orthopedic Surgery

## 2023-12-03 ENCOUNTER — Encounter: Payer: Self-pay | Admitting: Orthopedic Surgery

## 2023-12-03 VITALS — BP 112/76 | Ht 61.0 in | Wt 154.0 lb

## 2023-12-03 DIAGNOSIS — M5416 Radiculopathy, lumbar region: Secondary | ICD-10-CM

## 2023-12-03 DIAGNOSIS — G8929 Other chronic pain: Secondary | ICD-10-CM

## 2023-12-03 DIAGNOSIS — M5442 Lumbago with sciatica, left side: Secondary | ICD-10-CM

## 2023-12-03 DIAGNOSIS — G894 Chronic pain syndrome: Secondary | ICD-10-CM

## 2023-12-03 DIAGNOSIS — M5441 Lumbago with sciatica, right side: Secondary | ICD-10-CM | POA: Insufficient documentation

## 2023-12-03 DIAGNOSIS — Z9689 Presence of other specified functional implants: Secondary | ICD-10-CM

## 2023-12-03 NOTE — Patient Instructions (Addendum)
 It was so nice to see you today. Thank you so much for coming in.    I am sorry that you are hurting so much.   I want you to call your Medtr[employer] rep Renata Cowing) and let him know what is going on with your pain. He may be able to do some reprogramming to get you better coverage of your pain.   After you meet with him, let me know if things are not better and we can consider a lumbar MRI scan.   We will call you next week to check on your progress.   I ordered xrays of your mid back. You can get these at [facility] (building with the white pillars) off of [address]. The address is [address] [place], [place] [address]. You do not need any appointment.   Continue to follow with pain management at [facility].   Please do not hesitate to call if you have any questions or concerns. You can also message me in MyChart.   Glade Boys PA-C [phone]     The physicians and staff at [facility] at [facility] are committed to providing excellent care. You may receive a survey asking for feedback about your experience at our office. We value you your feedback and appreciate you taking the time to to fill it out. The [facility] leadership team is also available to discuss your experience in person, feel free to contact us  [phone].

## 2023-12-13 ENCOUNTER — Ambulatory Visit (INDEPENDENT_AMBULATORY_CARE_PROVIDER_SITE_OTHER): Admitting: Orthopedic Surgery

## 2023-12-13 ENCOUNTER — Encounter: Payer: Self-pay | Admitting: Orthopedic Surgery

## 2023-12-13 DIAGNOSIS — M79604 Pain in right leg: Secondary | ICD-10-CM | POA: Diagnosis not present

## 2023-12-13 DIAGNOSIS — M545 Low back pain, unspecified: Secondary | ICD-10-CM | POA: Diagnosis not present

## 2023-12-13 DIAGNOSIS — M79605 Pain in left leg: Secondary | ICD-10-CM

## 2023-12-13 DIAGNOSIS — M5416 Radiculopathy, lumbar region: Secondary | ICD-10-CM

## 2023-12-13 DIAGNOSIS — G8929 Other chronic pain: Secondary | ICD-10-CM

## 2023-12-13 DIAGNOSIS — Z9689 Presence of other specified functional implants: Secondary | ICD-10-CM

## 2023-12-13 DIAGNOSIS — Z9682 Presence of neurostimulator: Secondary | ICD-10-CM

## 2023-12-13 DIAGNOSIS — G894 Chronic pain syndrome: Secondary | ICD-10-CM

## 2023-12-13 NOTE — Progress Notes (Addendum)
   Telephone Visit- Progress Note: Referring Physician:  [facility] [address] [place],  [place] [address]  Primary Physician:  [facility]  This visit was performed via telephone.  Patient location: home Provider location: working from home  I spent a total of 12 minutes non-face-to-face activities for this visit on the date of this encounter including review of current clinical condition and response to treatment.    Patient has given verbal consent to this telephone visits and we reviewed the limitations of a telephone visit. Patient wishes to proceed.    Chief Complaint:  review xrays  DOS: 09/11/23  placement of medtronic SCS  HISTORY OF PRESENT ILLNESS:  Last seen by me on 12/03/23 when she was not seeing relief with SCS. She has constant LBP and bilateral leg pain.   Phone visit to review her xrays. She was also to call Medtr[employer] rep for reprogramming.   She continues with constant LBP with bilateral posterior/lateral leg pain to her feet. LBP > leg pain, right leg pain = left leg pain. She also notes some radiation of pain up into her shoulder blades- she is seeing ortho for left shoulder and has MRI schedule to evaluate for rotator cuff tear. She has intermittent numbness, tingling, and weakness in her legs.    She has not called Medtr[employer] rep for reprogramming.   She is seeing pain management at [facility].    Exam: No exam done as this was a telephone encounter.     Imaging: Thoracic xrays dated 12/03/23:  FINDINGS: There is no evidence of thoracic spine fracture. Alignment is normal. No other significant bone abnormalities are identified. Electronic stimulation wires noted centered at the T9 level.   IMPRESSION: Stimulation wires as described.     Electronically Signed   By: Fonda Field M.D.   On: 12/08/2023 19:06  I have personally reviewed the images and agree with the above interpretation.  Assessment and Plan: Terri Woods is not  having much relief of pain with SCS.    She has constant LBP with bilateral posterior/lateral leg pain to her feet. LBP > leg pain, right leg pain = left leg pain. She also notes some radiation of pain up into her shoulder blades- she is seeing ortho for left shoulder and has MRI schedule to evaluate for rotator cuff tear.   Xrays show SCS leads in good position when compared to xrays on 09/11/23.   Treatment options discussed with patient and following plan made:   - She will contact [employer] rep Martell) to discuss reprogramming.  - She will continue to follow up with pain management at [facility].  - She will call me once she meets with rep to let me know how she is doing.  - If no improvement with above, consider updated lumbar MRI.  Glade Boys PA-C Neurosurgery

## 2023-12-19 ENCOUNTER — Ambulatory Visit (INDEPENDENT_AMBULATORY_CARE_PROVIDER_SITE_OTHER)

## 2023-12-19 ENCOUNTER — Ambulatory Visit
Admission: EM | Admit: 2023-12-19 | Discharge: 2023-12-19 | Disposition: A | Discharge status: Home / Self Care | Attending: Physician Assistant | Admitting: Physician Assistant

## 2023-12-19 DIAGNOSIS — R509 Fever, unspecified: Secondary | ICD-10-CM | POA: Diagnosis present

## 2023-12-19 DIAGNOSIS — R051 Acute cough: Secondary | ICD-10-CM | POA: Insufficient documentation

## 2023-12-19 DIAGNOSIS — I10 Essential (primary) hypertension: Secondary | ICD-10-CM | POA: Insufficient documentation

## 2023-12-19 DIAGNOSIS — J029 Acute pharyngitis, unspecified: Secondary | ICD-10-CM | POA: Diagnosis present

## 2023-12-19 DIAGNOSIS — J069 Acute upper respiratory infection, unspecified: Secondary | ICD-10-CM | POA: Insufficient documentation

## 2023-12-19 LAB — GROUP A STREP BY PCR: Group A Strep by PCR: NOT DETECTED

## 2023-12-19 LAB — RESP PANEL BY RT-PCR (FLU A&B, COVID) ARPGX2
Influenza A by PCR: NEGATIVE
Influenza B by PCR: NEGATIVE
SARS Coronavirus 2 by RT PCR: NEGATIVE

## 2023-12-19 MED ORDER — PROMETHAZINE-DM 6.25-15 MG/5ML PO SYRP: 5.0000 mL | ORAL_SOLUTION | Freq: Four times a day (QID) | ORAL | 0 refills | Status: AC | PRN

## 2023-12-19 NOTE — ED Provider Notes (Signed)
 [facility]    CSN: [id number] Arrival date & time: 12/19/23  1253      History   Chief Complaint Chief Complaint  Patient presents with   Cough    HPI Terri Woods is a 53 y.o. female with history of Crohn's disease, fibromyalgia, chronic pain, diabetes, asthma, migraines, hypertension, GERD, carpal tunnel syndrome, breast cancer, asthma, polyarthritis.   Patient presents for fever, fatigue, cough, congestion, sore throat x 2 days. Temps up to 101.5 degrees. Taking OTC cough medication, but says her cough is still keeping her up at night. She rates sore throat at 8/10. Denies weakness, chest pain, shortness of breath, vomiting or diarrhea.   HPI  Past Medical History:  Diagnosis Date   Anxiety    Arthritis    neck, hips, fingers   Asthma    Back pain    Breast cancer (HCC) 2001   RT LUMPECTOMY PER PT   Carpal tunnel syndrome    right arm   Crohn's disease (HCC)    Depression    Diabetes (HCC)    Fibromyalgia    GERD (gastroesophageal reflux disease)    Glaucoma    Headache    History of kidney stones    Hypertension    Stroke [facility])    eye    Patient Active Problem List   Diagnosis Date Noted   Diabetic polyneuropathy associated with type 2 diabetes mellitus (HCC) 09/11/2023   Chronic bilateral low back pain with bilateral sciatica 09/11/2023   Chronic prescription opiate use 03/27/2023   Suicidal ideation 03/26/2023   GAD (generalized anxiety disorder) 03/26/2023   MDD (major depressive disorder) 03/26/2023   Head trauma 03/24/2023   Crush injury 03/24/2023   Loss of consciousness (HCC) 03/24/2023   Loss of vision 03/24/2023   Chronic radicular lumbar pain 03/19/2023   Lumbar spondylosis 03/19/2023   Degeneration of intervertebral disc of lumbar region with discogenic back pain and lower extremity pain 03/19/2023   Chronic pain syndrome 03/19/2023    Past Surgical History:  Procedure Laterality Date   BILATERAL CARPAL TUNNEL RELEASE  Bilateral 02/05/2019   Procedure: BILATERAL CARPAL TUNNEL RELEASE;  Surgeon: Kathlynn Sharper, MD;  Location: [facility] OR[facility];  Service: Orthopedics;  Laterality: Bilateral;   BREAST EXCISIONAL BIOPSY Right 15+ yrs ago   NEG   BREAST SURGERY     CATARACT EXTRACTION W/PHACO Right 10/30/2021   Procedure: CATARACT EXTRACTION PHACO AND INTRAOCULAR LENS PLACEMENT (IOC) RIGHT DIABETIC 1.85 00:20.9;  Surgeon: Myrna Adine Anes, MD;  Location: [facility];  Service: Ophthalmology;  Laterality: Right;  Diabetic   COLONOSCOPY WITH PROPOFOL  N/A 03/10/2020   Procedure: COLONOSCOPY WITH PROPOFOL ;  Surgeon: Tye Millet, DO;  Location: [facility];  Service: General;  Laterality: N/A;   FOOT SURGERY Left    LYMPH NODE BIOPSY     NASAL SEPTOPLASTY W/ TURBINOPLASTY Bilateral 12/25/2018   Procedure: NASAL SEPTOPLASTY WITH INFERIOR TURBINATE REDUCTION;  Surgeon: Edda Mt, MD;  Location: [facility];  Service: ENT;  Laterality: Bilateral;   SHOULDER SURGERY     SMALL INTESTINE SURGERY     THORACIC LAMINECTOMY FOR SPINAL CORD STIMULATOR N/A 09/11/2023   Procedure: THORACIC LAMINECTOMY FOR SPINAL CORD STIMULATOR;  Surgeon: Clois Fret, MD;  Location: [facility];  Service: Neurosurgery;  Laterality: N/A;  Spinal Cord Stimulator: MEDTRONIC  C1820, [id number], [id number]    OB History   No obstetric history on file.      Home Medications    Prior to Admission medications  Medication Sig Start Date End Date Taking? Authorizing Provider  amLODipine  (NORVASC ) 5 MG tablet Take 5 mg by mouth. 03/31/23  Yes [provider]  aspirin  EC 81 MG tablet Take 81 mg by mouth daily. Swallow whole.   Yes [provider]  atorvastatin  (LIPITOR) 40 MG tablet Take 40 mg by mouth daily. 01/29/23  Yes [provider]  bimatoprost (LUMIGAN) 0.01 % SOLN Place 1 drop into both eyes at bedtime.   Yes [provider]  clonazePAM  (KLONOPIN ) 2 MG tablet Take 2 mg by mouth 2 (two) times daily.  05/31/23  Yes [provider]  DULoxetine  (CYMBALTA ) 60 MG capsule Take 60 mg by mouth 2 (two) times daily.   Yes [provider]  glipiZIDE  (GLUCOTROL ) 10 MG tablet Take 20 mg by mouth 2 (two) times daily before a meal. 06/05/23  Yes [provider]  LANTUS  100 UNIT/ML injection Inject 50 Units into the skin at bedtime. 04/19/23 04/18/24 Yes [provider]  liraglutide (VICTOZA) 18 MG/3ML SOPN Inject 1.2 mg into the skin daily.   Yes [provider]  lisinopril  (ZESTRIL ) 20 MG tablet Take 20 mg by mouth in the morning and at bedtime.   Yes [provider]  omeprazole (PRILOSEC) 20 MG capsule Take 20 mg by mouth daily.   Yes [provider]  oxyCODONE -acetaminophen  (PERCOCET/ROXICET) 5-325 MG tablet Take one tablet every 6-8 hours as needed for pain. DNF: 11/28/23 10/30/23  Yes [provider]  OXYCONTIN  10 MG 12 hr tablet Take 10 mg by mouth every 8 (eight) hours.   Yes [provider]  promethazine -dextromethorphan (PROMETHAZINE -DM) 6.25-15 MG/5ML syrup Take 5 mLs by mouth 4 (four) times daily as needed. 12/19/23  Yes Arvis Huxley B, PA-C  QUEtiapine  (SEROQUEL ) 300 MG tablet Take 300 mg by mouth at bedtime.   Yes [provider]  QUEtiapine  (SEROQUEL ) 50 MG tablet Take 50 mg by mouth at bedtime.   Yes [provider]  albuterol  (VENTOLIN  HFA) 108 (90 Base) MCG/ACT inhaler Inhale 1-2 puffs into the lungs every 4 (four) hours as needed for wheezing or shortness of breath. 05/09/23   Van Knee, MD  brimonidine  (ALPHAGAN ) 0.2 % ophthalmic solution Place 1 drop into both eyes in the morning and at bedtime.    [provider]  cyclobenzaprine  (FLEXERIL ) 10 MG tablet Take 10 mg by mouth 3 (three) times daily. 10/04/23   [provider]  diclofenac Sodium (VOLTAREN) 1 % GEL Apply 1 Application topically 3 (three) times daily as needed (back pain).    [provider]  docusate  sodium (COLACE) 100 MG capsule Take 100 mg by mouth 2 (two) times daily as needed for mild constipation or moderate constipation.    [provider]  EPINEPHrine  (EPIPEN  2-PAK) 0.3 mg/0.3 mL IJ SOAJ injection Inject 0.3 mg into the muscle as needed. 02/09/23   Floy Roberts, MD  fluticasone  (FLONASE ) 50 MCG/ACT nasal spray Place 2 sprays into both nostrils daily. Patient taking differently: Place 2 sprays into both nostrils daily as needed for allergies. 05/09/23   Van Knee, MD  hydrOXYzine (ATARAX) 25 MG tablet Take 25 mg by mouth every 6 (six) hours as needed for itching or anxiety. 06/03/23   [provider]  ibuprofen  (ADVIL ) 800 MG tablet Take 800 mg by mouth every 8 (eight) hours as needed for moderate pain (pain score 4-6).    [provider]  ketoconazole (NIZORAL) 2 % shampoo Apply 1 Application topically 2 (two) times  a week.    [provider]  lidocaine  (LIDODERM ) 5 % Place 1 patch onto the skin daily. Remove & Discard patch within 12 hours or as directed by MD    [provider]  lidocaine  (XYLOCAINE ) 5 % ointment Apply 1 Application topically 3 (three) times daily as needed for moderate pain (pain score 4-6).    [provider]  lubiprostone (AMITIZA) 24 MCG capsule 24 mcg 2 (two) times daily as needed for constipation. 07/09/23   [provider]  naloxone  (NARCAN ) nasal spray 4 mg/0.1 mL Place 1 spray into the nose as needed (accidental overdose).    [provider]  oxybutynin (DITROPAN) 5 MG tablet Take 5 mg by mouth daily as needed for bladder spasms. 06/03/23   [provider]  senna (SENOKOT) 8.6 MG TABS tablet Take 1 tablet (8.6 mg total) by mouth daily as needed. 09/11/23   Gregory Edsel Ruth, PA  SUMAtriptan (IMITREX) 100 MG tablet Take 100 mg by mouth every 2 (two) hours as needed for migraine. May repeat in 2 hours if headache persists or recurs.    [provider]  tiZANidine   (ZANAFLEX ) 4 MG tablet TAKE 1 TABLET(4 MG) BY MOUTH EVERY 6 HOURS AS NEEDED FOR MUSCLE SPASMS 11/11/23   Ulis Bottcher, PA-C  tiZANidine  (ZANAFLEX ) 4 MG tablet Take 1 tablet (4 mg total) by mouth every 6 (six) hours as needed for muscle spasms. 11/11/23 11/10/24  Ulis Bottcher, PA-C  triamcinolone  cream (KENALOG ) 0.1 % Apply 1 Application topically daily as needed (ITCHING). 08/31/19 12/31/23  [provider]  topiramate (TOPAMAX) 100 MG tablet Take 100 mg by mouth 2 (two) times daily.  01/06/20  [provider]    Family History Family History  Problem Relation Age of Onset   Heart attack Sister 79   Heart disease Father    Breast cancer Mother    Breast cancer Maternal Aunt     Social History Social History   Tobacco Use   Smoking status: Never   Smokeless tobacco: Never  Vaping Use   Vaping status: Never Used  Substance Use Topics   Alcohol use: No    Alcohol/week: 0.0 standard drinks of alcohol   Drug use: Yes    Types: Oxycodone      Allergies   Exenatide, Levodopa, Lyrica [pregabalin], Other, Rotigotine, Erythromycin, Bee venom, Fentanyl , Hysingla er [hydrocodone bitartrate er], Meloxicam , Doxycycline, Morphine, Penicillins, Semaglutide, and Shellfish allergy   Review of Systems Review of Systems  Constitutional:  Positive for fatigue and fever. Negative for chills and diaphoresis.  HENT:  Positive for congestion, rhinorrhea and sore throat. Negative for ear pain, sinus pressure and sinus pain.   Respiratory:  Positive for cough. Negative for shortness of breath.   Cardiovascular:  Negative for chest pain.  Gastrointestinal:  Negative for abdominal pain, diarrhea, nausea and vomiting.  Musculoskeletal:  Negative for arthralgias and myalgias.  Skin:  Negative for rash.  Neurological:  Negative for weakness and headaches.  Hematological:  Negative for adenopathy.  Psychiatric/Behavioral:  Positive for sleep disturbance.      Physical Exam Triage  Vital Signs ED Triage Vitals  Encounter Vitals Group     BP      Girls Systolic BP Percentile      Girls Diastolic BP Percentile      Boys Systolic BP Percentile      Boys Diastolic BP Percentile      Pulse      Resp  Temp      Temp src      SpO2      Weight      Height      Head Circumference      Peak Flow      Pain Score      Pain Loc      Pain Education      Exclude from Growth Chart    No data found.  Updated Vital Signs BP (!) 151/90 (BP Location: Right Arm)   Pulse 92   Temp 97.9 F (36.6 C) (Oral)   Resp 16   LMP 12/17/2022 (Approximate)   SpO2 94%     Physical Exam Vitals and nursing note reviewed.  Constitutional:      General: She is not in acute distress.    Appearance: Normal appearance. She is not ill-appearing or toxic-appearing.  HENT:     Head: Normocephalic and atraumatic.     Nose: Congestion present.     Mouth/Throat:     Mouth: Mucous membranes are moist.     Pharynx: Oropharynx is clear. Posterior oropharyngeal erythema present.  Eyes:     General: No scleral icterus.       Right eye: No discharge.        Left eye: No discharge.     Conjunctiva/sclera: Conjunctivae normal.  Cardiovascular:     Rate and Rhythm: Normal rate and regular rhythm.     Heart sounds: Normal heart sounds.  Pulmonary:     Effort: Pulmonary effort is normal. No respiratory distress.     Breath sounds: Normal breath sounds.  Musculoskeletal:     Cervical back: Neck supple.  Skin:    General: Skin is dry.  Neurological:     General: No focal deficit present.     Mental Status: She is alert. Mental status is at baseline.     Motor: No weakness.     Gait: Gait normal.  Psychiatric:        Mood and Affect: Mood normal.        Behavior: Behavior normal.      UC Treatments / Results  Labs (all labs ordered are listed, but only abnormal results are displayed) Labs Reviewed  RESP PANEL BY RT-PCR (FLU A&B, COVID) [id number]  GROUP A STREP BY PCR     EKG   Radiology DG Chest 2 View Result Date: 12/19/2023 CLINICAL DATA:  Fever, cough, congestion EXAM: CHEST - 2 VIEW COMPARISON:  05/09/2023 FINDINGS: Heart and mediastinal contours are within normal limits. No focal opacities or effusions. No acute bony abnormality. Postoperative changes in the left clavicle. Spinal stimulator in place with the tip in the midthoracic region. IMPRESSION: No active cardiopulmonary disease. Electronically Signed   By: Franky Crease M.D.   On: 12/19/2023 13:27    Procedures Procedures (including critical care time)  Medications Ordered in UC Medications - No data to display  Initial Impression / Assessment and Plan / UC Course  I have reviewed the triage vital signs and the nursing notes.  Pertinent labs & imaging results that were available during my care of the patient were reviewed by me and considered in my medical decision making (see chart for details).   53 y/o female presents for cough, congestion, fever, fatigue x 2 days. No chest pain or shortness of breath.  BP elevated 151/90. Other vitals normal and stable. Patient taking Lisinopril . Patient to continue taking this medicine and monitor BP. If consistently >140/90  should f/u  with PCP.  On exam, has nasal congestion and erythema of posterior pharynx. Chest clear. Heart RRR.   Patient requesting cough medication to make me sleep. Reviewed controlled substance database. Patient received 195 oxycodone  in the past 3 weeks. Also takes clonazepam .   Resp panel obtained and negative.   CXR obtained and negative.   Reviewed results with patient. Will also check for strep given fever and severe sore throat. Will call if positive.   Viral URI. Sent promethazine  DM for cough. Reviewed return precautions.  Strep negative.  Acute illness with systemic symptoms.    Final Clinical Impressions(s) / UC Diagnoses   Final diagnoses:  Fever, unspecified  Viral upper respiratory tract infection   Acute cough  Sore throat  Essential hypertension     Discharge Instructions      - Negative COVID and flu testing and chest x-ray does not show evidence of pneumonia. - We are checking for strep and we will call you if the results are positive. - I sent cough medicine to the pharmacy.  You can continue Tylenol , rest and fluids.  Symptoms consistent with a virus especially if strep is negative.  If strep is positive we will send antibiotics. - If fever is not going away in a few days or you are feeling worse seek reevaluation either here or in the ER.     ED Prescriptions     Medication Sig Dispense Auth. Provider   promethazine -dextromethorphan (PROMETHAZINE -DM) 6.25-15 MG/5ML syrup Take 5 mLs by mouth 4 (four) times daily as needed. 118 mL Arvis Jolan NOVAK, PA-C      I have reviewed the PDMP during this encounter.   Arvis Jolan NOVAK, PA-C 12/19/23 1526

## 2023-12-19 NOTE — ED Triage Notes (Signed)
 Cough, congestion, headache, fever of 101.5 last night, body aches x 2 days. Taking tylenol  cold and flu.

## 2023-12-19 NOTE — Discharge Instructions (Signed)
-   Negative COVID and flu testing and chest x-ray does not show evidence of pneumonia. - We are checking for strep and we will call you if the results are positive. - I sent cough medicine to the pharmacy.  You can continue Tylenol , rest and fluids.  Symptoms consistent with a virus especially if strep is negative.  If strep is positive we will send antibiotics. - If fever is not going away in a few days or you are feeling worse seek reevaluation either here or in the ER.
